# Patient Record
Sex: Male | Born: 1945 | Race: White | Hispanic: No | Marital: Married | Smoking: Current every day smoker
Health system: Southern US, Community
[De-identification: ages and names within clinical notes are randomized; demographics above are authoritative.]

## PROBLEM LIST (undated history)

## (undated) DIAGNOSIS — I350 Nonrheumatic aortic (valve) stenosis: Secondary | ICD-10-CM

## (undated) DIAGNOSIS — K429 Umbilical hernia without obstruction or gangrene: Secondary | ICD-10-CM

## (undated) DIAGNOSIS — G0490 Encephalitis and encephalomyelitis, unspecified: Secondary | ICD-10-CM

## (undated) DIAGNOSIS — Z9289 Personal history of other medical treatment: Secondary | ICD-10-CM

## (undated) DIAGNOSIS — J189 Pneumonia, unspecified organism: Secondary | ICD-10-CM

## (undated) DIAGNOSIS — E039 Hypothyroidism, unspecified: Secondary | ICD-10-CM

## (undated) DIAGNOSIS — I639 Cerebral infarction, unspecified: Secondary | ICD-10-CM

## (undated) DIAGNOSIS — R011 Cardiac murmur, unspecified: Secondary | ICD-10-CM

## (undated) DIAGNOSIS — Z9989 Dependence on other enabling machines and devices: Secondary | ICD-10-CM

## (undated) DIAGNOSIS — Z720 Tobacco use: Secondary | ICD-10-CM

## (undated) DIAGNOSIS — E079 Disorder of thyroid, unspecified: Secondary | ICD-10-CM

## (undated) DIAGNOSIS — E119 Type 2 diabetes mellitus without complications: Secondary | ICD-10-CM

## (undated) DIAGNOSIS — J439 Emphysema, unspecified: Secondary | ICD-10-CM

## (undated) DIAGNOSIS — E669 Obesity, unspecified: Secondary | ICD-10-CM

## (undated) DIAGNOSIS — I6529 Occlusion and stenosis of unspecified carotid artery: Secondary | ICD-10-CM

## (undated) DIAGNOSIS — I4891 Unspecified atrial fibrillation: Secondary | ICD-10-CM

## (undated) DIAGNOSIS — M1990 Unspecified osteoarthritis, unspecified site: Secondary | ICD-10-CM

## (undated) DIAGNOSIS — G4733 Obstructive sleep apnea (adult) (pediatric): Secondary | ICD-10-CM

## (undated) DIAGNOSIS — E785 Hyperlipidemia, unspecified: Secondary | ICD-10-CM

## (undated) DIAGNOSIS — K8010 Calculus of gallbladder with chronic cholecystitis without obstruction: Secondary | ICD-10-CM

## (undated) DIAGNOSIS — I10 Essential (primary) hypertension: Secondary | ICD-10-CM

## (undated) DIAGNOSIS — R0602 Shortness of breath: Secondary | ICD-10-CM

## (undated) DIAGNOSIS — L309 Dermatitis, unspecified: Secondary | ICD-10-CM

## (undated) DIAGNOSIS — K8000 Calculus of gallbladder with acute cholecystitis without obstruction: Secondary | ICD-10-CM

## (undated) HISTORY — DX: Emphysema, unspecified: J43.9

## (undated) HISTORY — DX: Dermatitis, unspecified: L30.9

## (undated) HISTORY — DX: Hyperlipidemia, unspecified: E78.5

## (undated) HISTORY — DX: Unspecified osteoarthritis, unspecified site: M19.90

## (undated) HISTORY — DX: Tobacco use: Z72.0

## (undated) HISTORY — DX: Obesity, unspecified: E66.9

## (undated) HISTORY — DX: Personal history of other medical treatment: Z92.89

## (undated) HISTORY — PX: JOINT REPLACEMENT: SHX530

## (undated) HISTORY — DX: Umbilical hernia without obstruction or gangrene: K42.9

## (undated) HISTORY — PX: KNEE ARTHROSCOPY: SUR90

## (undated) HISTORY — PX: TONSILLECTOMY: SUR1361

## (undated) SURGERY — LAPAROSCOPIC CHOLECYSTECTOMY WITH INTRAOPERATIVE CHOLANGIOGRAM
Anesthesia: General

---

## 2000-10-13 ENCOUNTER — Ambulatory Visit (HOSPITAL_BASED_OUTPATIENT_CLINIC_OR_DEPARTMENT_OTHER): Admission: RE | Admit: 2000-10-13 | Discharge: 2000-10-13 | Payer: Self-pay | Admitting: Orthopedic Surgery

## 2005-03-28 HISTORY — PX: TRANSTHORACIC ECHOCARDIOGRAM: SHX275

## 2006-04-07 HISTORY — PX: HERNIA REPAIR: SHX51

## 2006-04-29 ENCOUNTER — Ambulatory Visit (HOSPITAL_COMMUNITY): Admission: RE | Admit: 2006-04-29 | Discharge: 2006-04-30 | Payer: Self-pay | Admitting: General Surgery

## 2006-11-07 ENCOUNTER — Ambulatory Visit: Payer: Self-pay | Admitting: Gastroenterology

## 2006-11-21 ENCOUNTER — Ambulatory Visit: Payer: Self-pay | Admitting: Gastroenterology

## 2007-01-27 ENCOUNTER — Encounter: Payer: Self-pay | Admitting: Gastroenterology

## 2007-01-27 ENCOUNTER — Ambulatory Visit: Payer: Self-pay | Admitting: Gastroenterology

## 2007-11-24 ENCOUNTER — Encounter: Admission: RE | Admit: 2007-11-24 | Discharge: 2007-11-24 | Payer: Self-pay | Admitting: Orthopedic Surgery

## 2010-01-23 ENCOUNTER — Encounter: Admission: RE | Admit: 2010-01-23 | Discharge: 2010-01-23 | Payer: Self-pay | Admitting: Family Medicine

## 2010-01-23 DIAGNOSIS — G4730 Sleep apnea, unspecified: Secondary | ICD-10-CM | POA: Insufficient documentation

## 2010-02-23 DIAGNOSIS — Z9289 Personal history of other medical treatment: Secondary | ICD-10-CM

## 2010-02-23 HISTORY — DX: Personal history of other medical treatment: Z92.89

## 2010-11-23 NOTE — Procedures (Signed)
[facility] ([place])  Patient:    Mason Patton, Mason Patton                       MRN: [id number] Proc. Date: 10/13/00 Adm. Date:  [id number] Attending:  Milly Jakob                           Procedure Report  PREOPERATIVE DIAGNOSIS:  Medial meniscus tear with possible osteochondral injury.  POSTOPERATIVE DIAGNOSIS: 1. Medial meniscus tear. 2. Medial femoral condylar chondromalacia. 3. Lateral tibial plateau chondral injury. 4. Significant patellofemoral chondral injury.  OPERATION PERFORMED: 1. Partial posterior horn medial meniscectomy. 2. Debridement of medial femoral condyle. 3. Debridement of lateral tibial plateau. 4. Debridement of patellofemoral joint.  SURGEON:  Harvie Junior, M.D.  ANESTHESIA:  Block.  INDICATIONS FOR PROCEDURE:  The patient is a 65 year old male with a long history of having medial joint line pain after a twisting type injury.  He was given conservative care for six weeks without resolution of symptoms.  Because of continued complaints of pain and exam which was remarkable for meniscal tear, the patient was brought to the operating room for examination under anesthesia and arthroscopy.  DESCRIPTION OF PROCEDURE:  The patient was brought to the operating room and after adequate anesthesia was obtained with a knee block, patient was placed supine on the operating table.  The left leg was then examined under anesthesia and noted to be stable in all directions.  At this point the leg was prepped and draped in the usual sterile fashion and following this, routine arthroscopic examination of the knee revealed that there was obvious chondromalacia of the patellofemoral joint.  This was debrided with a suction shaver back to a stable rim of articular cartilage on both the patellar side and the trochlear side.  Attention was then turned to the medial compartment where there was noted to be a posterior horn medial meniscus tear  and corresponding significant chondromalacia of the medial femoral condyle.  The medial meniscus was debrided with a combination of straight-biting forceps, upbiting forceps and the remaining meniscal rim was contoured down with a suction shaver.  When the meniscus was stable, the medial femoral condyle was evaluated and noted to have fairly significant grade 3 changes on the medial femoral condyle.  This was debrided with a suction shaver.  Attention was then turned to the notch where the ACL was noted to be normal.  Attention was then turned laterally where the femoral condyle was normal.  The meniscus was normal.  There was a really soft area on the lateral tibial plateau and this was debrided with a suction shaver and back to a stable rim.  Once this was accomplished, the knee was copiously irrigated and suctioned dry.  A final check was made to make sure there were no loose fragmented pieces.  The arthroscope portal was then closed with a bandage and a sterile compressive dressing was applied.  The patient was transferred to the recovery room where he was noted to be in satisfactory condition.  Estimated blood loss for this procedure was none. DD:  10/13/00 TD:  10/13/00 Job: [id number]

## 2010-11-23 NOTE — Op Note (Signed)
Mason Patton, Mason Patton              ACCOUNT NO.:  [id number]   MEDICAL RECORD NO.:  [id number]          PATIENT TYPE:  AMB   LOCATION:  SDS                          FACILITY:  [facility]   PHYSICIAN:  Gita Kudo, M.D. DATE OF BIRTH:  [birth year]   DATE OF PROCEDURE:  04/29/2006  DATE OF DISCHARGE:                                 OPERATIVE REPORT   OPERATIVE PROCEDURE:  Repair of umbilical hernia, 8-cm-diameter Ventralex  mesh.   SURGEON:  Gita Kudo, M.D.   ANESTHESIA:  General.   PREOPERATIVE DIAGNOSIS:  Umbilical hernia.   POSTOPERATIVE DIAGNOSIS:  Umbilical hernia.   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  He also has a large diastasis.  He  is overweight at approximately 310 pounds and also has sleep apnea.   OPERATIVE FINDINGS:  The patient had a medium-sized umbilical hernia with an  approximate 3-cm defect.  It was easily reduced.  I did nothing about his  diastasis.   OPERATIVE PROCEDURE:  Under satisfactory general endotracheal anesthesia,  having received 1 g Ancef preop, the patient's abdomen was prepped and  draped in a standard fashion.  A total of 30 mL of 0.5% Marcaine with  epinephrine were infiltrated for postoperative analgesia.  A supraumbilical  transverse incision made and carried down to the fascia.  The hernia sac was  dissected from the overlying umbilical skin.  Self-retainers were placed and  then the hernia-abdominal wall fascia junction was circumscribed with the  cautery until I entered the preperitoneal space.  Then the hernia sac was  inverted and reduced into that space and finger dissection used to develop  the space for placement of the Ventralex mesh.  I was able to free the  undersurface all around and not enter the peritoneal cavity.   Following this, a large -- 8-cm -- Ventralex mesh was selected.  A suture  was placed through the abdominal wall, through the  upper portion of the mesh  and then back up through the abdominal wall and the mesh placed into the  preperitoneal space and tied.  Then 3 other sutures were placed in the other  quadrants in a similar fashion and when tied, the mesh lay in excellent  position.  The fascia was then closed in a transverse direction with figure-  of-eight 0 Prolene sutures, taking intermittent bites of the mesh.  When  completed, the repair lay without tension and appeared  solid.  The wound was then lavaged with saline and closed in layers with 2-0  and 3-0 Vicryl and Steri-Strips for skin.  Sterile absorbent dressings were  then applied and the patient went to the recovery room from the operating  room in good condition without complication.           ______________________________  Gita Kudo, M.D.     MRL/MEDQ  D:  04/29/2006  T:  04/30/2006  Job:  [id number]   cc:   Tammy R. Collins [place], M.D.

## 2011-09-08 ENCOUNTER — Encounter (HOSPITAL_COMMUNITY): Payer: Self-pay | Admitting: *Deleted

## 2011-09-08 ENCOUNTER — Emergency Department (HOSPITAL_COMMUNITY)
Admission: EM | Admit: 2011-09-08 | Discharge: 2011-09-08 | Disposition: A | Discharge status: Nursing Home (Includes ICF) | Payer: 59 | Source: Home / Self Care | Attending: Emergency Medicine | Admitting: Emergency Medicine

## 2011-09-08 ENCOUNTER — Other Ambulatory Visit: Payer: Self-pay

## 2011-09-08 ENCOUNTER — Emergency Department (HOSPITAL_COMMUNITY): Payer: 59

## 2011-09-08 ENCOUNTER — Inpatient Hospital Stay (HOSPITAL_COMMUNITY)
Admission: EM | Admit: 2011-09-08 | Discharge: 2011-09-10 | DRG: 419 | Disposition: A | Discharge status: Home / Self Care | Payer: 59 | Attending: General Surgery | Admitting: General Surgery

## 2011-09-08 ENCOUNTER — Encounter (HOSPITAL_COMMUNITY): Payer: Self-pay

## 2011-09-08 DIAGNOSIS — K8020 Calculus of gallbladder without cholecystitis without obstruction: Secondary | ICD-10-CM

## 2011-09-08 DIAGNOSIS — E119 Type 2 diabetes mellitus without complications: Secondary | ICD-10-CM | POA: Insufficient documentation

## 2011-09-08 DIAGNOSIS — K8010 Calculus of gallbladder with chronic cholecystitis without obstruction: Secondary | ICD-10-CM

## 2011-09-08 DIAGNOSIS — E079 Disorder of thyroid, unspecified: Secondary | ICD-10-CM

## 2011-09-08 DIAGNOSIS — K810 Acute cholecystitis: Secondary | ICD-10-CM

## 2011-09-08 DIAGNOSIS — F17200 Nicotine dependence, unspecified, uncomplicated: Secondary | ICD-10-CM | POA: Diagnosis present

## 2011-09-08 DIAGNOSIS — R112 Nausea with vomiting, unspecified: Secondary | ICD-10-CM | POA: Diagnosis present

## 2011-09-08 DIAGNOSIS — R1011 Right upper quadrant pain: Secondary | ICD-10-CM

## 2011-09-08 DIAGNOSIS — Z79899 Other long term (current) drug therapy: Secondary | ICD-10-CM

## 2011-09-08 DIAGNOSIS — G4733 Obstructive sleep apnea (adult) (pediatric): Secondary | ICD-10-CM | POA: Diagnosis present

## 2011-09-08 DIAGNOSIS — R109 Unspecified abdominal pain: Secondary | ICD-10-CM

## 2011-09-08 DIAGNOSIS — I10 Essential (primary) hypertension: Secondary | ICD-10-CM

## 2011-09-08 DIAGNOSIS — Z7982 Long term (current) use of aspirin: Secondary | ICD-10-CM

## 2011-09-08 DIAGNOSIS — K8000 Calculus of gallbladder with acute cholecystitis without obstruction: Principal | ICD-10-CM | POA: Diagnosis present

## 2011-09-08 HISTORY — DX: Essential (primary) hypertension: I10

## 2011-09-08 HISTORY — DX: Disorder of thyroid, unspecified: E07.9

## 2011-09-08 HISTORY — DX: Calculus of gallbladder with chronic cholecystitis without obstruction: K80.10

## 2011-09-08 HISTORY — DX: Calculus of gallbladder with acute cholecystitis without obstruction: K80.00

## 2011-09-08 LAB — COMPREHENSIVE METABOLIC PANEL
ALT: 18 U/L (ref 0–53)
Alkaline Phosphatase: 73 U/L (ref 39–117)
CO2: 25 mEq/L (ref 19–32)
GFR calc Af Amer: >90 mL/min (ref >90)
GFR calc non Af Amer: >90 mL/min (ref >90)
Glucose, Bld: 83 mg/dL (ref 70–99)
Potassium: 3.8 mEq/L (ref 3.5–5.1)
Sodium: 139 mEq/L (ref 135–145)

## 2011-09-08 LAB — DIFFERENTIAL
Lymphocytes Relative: 19 % (ref 12–46)
Lymphs Abs: 2.1 10*3/uL (ref 0.7–4.0)
Neutrophils Relative %: 66 % (ref 43–77)

## 2011-09-08 LAB — URINALYSIS, ROUTINE W REFLEX MICROSCOPIC
Hgb urine dipstick: NEGATIVE
Nitrite: NEGATIVE
Specific Gravity, Urine: 1.026 (ref 1.005–1.030)
Urobilinogen, UA: 1 mg/dL (ref 0.0–1.0)
pH: 5.5 (ref 5.0–8.0)

## 2011-09-08 LAB — CBC
MCV: 87.1 fL (ref 78.0–100.0)
Platelets: 231 10*3/uL (ref 150–400)
RBC: 4.56 MIL/uL (ref 4.22–5.81)
WBC: 10.8 10*3/uL — ABNORMAL HIGH (ref 4.0–10.5)

## 2011-09-08 LAB — URINE MICROSCOPIC-ADD ON

## 2011-09-08 MED ORDER — HYDROMORPHONE HCL PF 1 MG/ML IJ SOLN: 1.0000 mg | INTRAMUSCULAR | Status: DC | PRN

## 2011-09-08 MED ORDER — INSULIN ASPART 100 UNIT/ML ~~LOC~~ SOLN: 0.0000 [IU] | SUBCUTANEOUS | Status: DC

## 2011-09-08 MED ORDER — ONDANSETRON HCL 4 MG/2ML IJ SOLN
4.0000 mg | Freq: Four times a day (QID) | INTRAMUSCULAR | Status: DC | PRN
  Administered 2011-09-09: 4 mg via INTRAVENOUS
  Filled 2011-09-08: qty 2

## 2011-09-08 MED ORDER — INSULIN ASPART 100 UNIT/ML ~~LOC~~ SOLN
0.0000 [IU] | SUBCUTANEOUS | Status: DC
  Administered 2011-09-09 – 2011-09-10 (×3): 2 [IU] via SUBCUTANEOUS
  Administered 2011-09-10: 3 [IU] via SUBCUTANEOUS
  Filled 2011-09-08: qty 3

## 2011-09-08 MED ORDER — SPIRONOLACTONE 25 MG PO TABS
25.0000 mg | ORAL_TABLET | Freq: Every day | ORAL | Status: DC
  Administered 2011-09-08 – 2011-09-09 (×2): 25 mg via ORAL
  Filled 2011-09-08 (×3): qty 1

## 2011-09-08 MED ORDER — ONDANSETRON HCL 4 MG/2ML IJ SOLN: 4.0000 mg | Freq: Four times a day (QID) | INTRAMUSCULAR | Status: DC | PRN

## 2011-09-08 MED ORDER — ENOXAPARIN SODIUM 40 MG/0.4ML ~~LOC~~ SOLN: 40.0000 mg | SUBCUTANEOUS | Status: DC

## 2011-09-08 MED ORDER — PIPERACILLIN-TAZOBACTAM 3.375 G IVPB
3.3750 g | Freq: Three times a day (TID) | INTRAVENOUS | Status: DC
  Administered 2011-09-09 – 2011-09-10 (×4): 3.375 g via INTRAVENOUS
  Filled 2011-09-08 (×6): qty 50

## 2011-09-08 MED ORDER — OXYCODONE HCL 5 MG PO TABS: 5.0000 mg | ORAL_TABLET | ORAL | Status: DC | PRN

## 2011-09-08 MED ORDER — PIPERACILLIN-TAZOBACTAM 3.375 G IVPB
3.3750 g | Freq: Once | INTRAVENOUS | Status: DC
  Administered 2011-09-08: 3.375 g via INTRAVENOUS
  Filled 2011-09-08: qty 50

## 2011-09-08 MED ORDER — HYDROMORPHONE HCL PF 1 MG/ML IJ SOLN: 0.5000 mg | INTRAMUSCULAR | Status: DC | PRN

## 2011-09-08 MED ORDER — BENAZEPRIL HCL 20 MG PO TABS
20.0000 mg | ORAL_TABLET | Freq: Every day | ORAL | Status: DC
  Administered 2011-09-08 – 2011-09-09 (×2): 20 mg via ORAL
  Filled 2011-09-08 (×3): qty 1

## 2011-09-08 MED ORDER — ACETAMINOPHEN 650 MG RE SUPP: 650.0000 mg | Freq: Four times a day (QID) | RECTAL | Status: DC | PRN

## 2011-09-08 MED ORDER — ALUM & MAG HYDROXIDE-SIMETH 200-200-20 MG/5ML PO SUSP: 30.0000 mL | Freq: Four times a day (QID) | ORAL | Status: DC | PRN

## 2011-09-08 MED ORDER — ENOXAPARIN SODIUM 30 MG/0.3ML ~~LOC~~ SOLN: 1.0000 mg/kg | Freq: Two times a day (BID) | SUBCUTANEOUS | Status: DC

## 2011-09-08 MED ORDER — LEVOTHYROXINE SODIUM 125 MCG PO TABS
250.0000 ug | ORAL_TABLET | Freq: Every day | ORAL | Status: DC
  Administered 2011-09-09 – 2011-09-10 (×2): 250 ug via ORAL
  Filled 2011-09-08 (×3): qty 2

## 2011-09-08 MED ORDER — ACETAMINOPHEN 325 MG PO TABS: 650.0000 mg | ORAL_TABLET | Freq: Four times a day (QID) | ORAL | Status: DC | PRN

## 2011-09-08 MED ORDER — ZOLPIDEM TARTRATE 5 MG PO TABS: 5.0000 mg | ORAL_TABLET | Freq: Every evening | ORAL | Status: DC | PRN

## 2011-09-08 MED ORDER — PIPERACILLIN-TAZOBACTAM 3.375 G IVPB
3.3750 g | Freq: Three times a day (TID) | INTRAVENOUS | Status: DC
  Filled 2011-09-08 (×2): qty 50

## 2011-09-08 MED ORDER — PANTOPRAZOLE SODIUM 40 MG IV SOLR
40.0000 mg | Freq: Every day | INTRAVENOUS | Status: DC
  Administered 2011-09-08 – 2011-09-09 (×2): 40 mg via INTRAVENOUS
  Filled 2011-09-08 (×3): qty 40

## 2011-09-08 MED ORDER — SODIUM CHLORIDE 0.9 % IV SOLN: INTRAVENOUS | Status: DC

## 2011-09-08 MED ORDER — POTASSIUM CHLORIDE IN NACL 20-0.45 MEQ/L-% IV SOLN
INTRAVENOUS | Status: DC
  Administered 2011-09-09: via INTRAVENOUS
  Filled 2011-09-08 (×5): qty 1000

## 2011-09-08 MED ORDER — ONDANSETRON HCL 8 MG PO TABS: 4.0000 mg | ORAL_TABLET | Freq: Four times a day (QID) | ORAL | Status: DC | PRN

## 2011-09-08 MED ORDER — AMLODIPINE BESYLATE 10 MG PO TABS
10.0000 mg | ORAL_TABLET | Freq: Every day | ORAL | Status: DC
Start: 2011-09-08 — End: 2011-09-10
  Administered 2011-09-08 – 2011-09-09 (×2): 10 mg via ORAL
  Filled 2011-09-08 (×3): qty 1

## 2011-09-08 NOTE — ED Provider Notes (Signed)
History     CSN: [id number]  Arrival date & time 09/08/11  1034   First MD Initiated Contact with Patient 09/08/11 1122      Chief Complaint  Patient presents with  . Abdominal Pain    HPI Comments: Patient has a history of diabetes, hypertension reports  constant, midline abdominal pain for 5 days. States it changes from sharp to dull- states it is sharp when he goes from lying to sitting for/sitting to lying, and is worse when he eats. Abdominal pain not related to exertion, rest, fasting, but movement, urination. This pain is becoming more severe over the past 2 days. States he initially had back pain, multiple episodes of emesis, and then felt a "tearing" sensation in his abdomen 5 days ago. No further episodes of emesis, or tearing sensation. Patient has a large ventral hernia, but does not remember if this got larger after the tearing sensation. No nausea, vomiting, chest pain, shortness of breath, diaphoresis,  anorexia. No palpitations, presyncope, syncope. Last bowel movement yesterday, and was within normal limits for him. No hematuria, urinary complaints. No history of personal, family history of AAA.    ROS as noted in HPI. All other ROS negative.    Past Medical History  Diagnosis Date  . Hypertension   . Diabetes mellitus   . Thyroid disease     Past Surgical History  Procedure Date  . Inguinal hernia repair   . Knee arthroscopy     History reviewed. No pertinent family history.  History  Substance Use Topics  . Smoking status: Current Everyday Smoker -- 0.5 packs/day  . Smokeless tobacco: Not on file  . Alcohol Use: No      Review of Systems  Allergies  Codeine and Demerol  Home Medications   Current Outpatient Rx  Name Route Sig Dispense Refill  . AMLODIPINE BESYLATE 10 MG PO TABS Oral Take 10 mg by mouth daily.    . ASPIRIN 81 MG PO TABS Oral Take 81 mg by mouth daily.    [name] BENAZEPRIL HCL 20 MG PO TABS Oral Take 20 mg by mouth daily.    .  GLYBURIDE-METFORMIN 5-500 MG PO TABS Oral Take 1 tablet by mouth daily with breakfast.    . LEVOTHYROXINE SODIUM 200 MCG PO TABS Oral Take 200 mcg by mouth daily.    . MELOXICAM 15 MG PO TABS Oral Take 15 mg by mouth daily.    [name] PRAVASTATIN SODIUM 40 MG PO TABS Oral Take 40 mg by mouth daily.    [name] SPIRONOLACTONE 25 MG PO TABS Oral Take 25 mg by mouth daily.      BP 144/75  Pulse 59  Temp(Src) 98.6 F (37 C) (Oral)  Resp 21  SpO2 95%  Physical Exam  Nursing note and vitals reviewed. Constitutional: He is oriented to person, place, and time. He appears well-developed and well-nourished.  HENT:  Head: Normocephalic and atraumatic.  Eyes: Conjunctivae and EOM are normal.  Neck: Normal range of motion.  Cardiovascular: Normal rate, regular rhythm, normal heart sounds, intact distal pulses and normal pulses.  Exam reveals no gallop and no friction rub.   No murmur heard. Pulmonary/Chest: Effort normal and breath sounds normal.  Abdominal: Soft. Bowel sounds are normal. He exhibits no distension, no fluid wave, no abdominal bruit and no pulsatile midline mass. There is no rebound and no CVA tenderness. A hernia is present. Hernia confirmed positive in the ventral area.       Umbilical surgical  scar. Large, tender, reducible ventral hernia.  Musculoskeletal: Normal range of motion. He exhibits no edema.  Neurological: He is alert and oriented to person, place, and time.  Skin: Skin is warm and dry. No rash noted.  Psychiatric: He has a normal mood and affect. His behavior is normal. Judgment and thought content normal.    ED Course  Procedures (including critical care time)  Labs Reviewed - No data to display No results found.   1. Abdominal pain     EKG: Sinus bradycardia, rate 56. Normal intervals. left axis deviation. No hypertrophy. No ST-T wave changes. No previous EKG for comparison.   MDM  Previous medical records reviewed. Patient had a umbilical hernia repair in 2007.   Patient appears comfortable, with a tender ventral hernia. No abdominal bruit. No palpable, pulsatile abdominal mass, but patient is obese. Concern for abdominal wall tear versus AAA. Vital signs acceptable, pulses equal in all extremities. EKG with no acute ischemic changes. Transferring the ED for further evaluation  Luiz Blare, MD 09/08/11 1254

## 2011-09-08 NOTE — ED Notes (Signed)
Pt reports that he uses bipap machine to sleep at night.

## 2011-09-08 NOTE — ED Notes (Signed)
Multiple attempts at contacting ultrasound for pt. To contact where pt is on list. Will continue.

## 2011-09-08 NOTE — Progress Notes (Signed)
ANTIBIOTIC CONSULT NOTE - INITIAL  Pharmacy Consult for Zosyn Indication: Acute cholecystitis  Allergies  Allergen Reactions  . Codeine Nausea And Vomiting  . Demerol Nausea And Vomiting    Patient Measurements: Height: 5\' 10"  (177.8 cm) Weight: 266 lb 4.8 oz (120.793 kg) IBW/kg (Calculated) : 73   Vital Signs: Temp: 97.8 F (36.6 C) (03/03 2215) Temp src: Oral (03/03 2215) BP: 145/72 mmHg (03/03 2215) Pulse Rate: 63  (03/03 2215) Intake/Output from previous day:   Intake/Output from this shift:    Labs:  Basename 09/08/11 1415  WBC 10.8*  HGB 13.5  PLT 231  LABCREA --  CREATININE 0.77   Estimated Creatinine Clearance: 119.9 ml/min (by C-G formula based on Cr of 0.77). No results found for this basename: VANCOTROUGH:2,VANCOPEAK:2,VANCORANDOM:2,GENTTROUGH:2,GENTPEAK:2,GENTRANDOM:2,TOBRATROUGH:2,TOBRAPEAK:2,TOBRARND:2,AMIKACINPEAK:2,AMIKACINTROU:2,AMIKACIN:2, in the last 72 hours   Microbiology: No results found for this or any previous visit (from the past 720 hour(s)).  Medical History: Past Medical History  Diagnosis Date  . Hypertension   . Diabetes mellitus   . Thyroid disease   . Diabetes mellitus   . Obstructive sleep apnea of adult   . Gallstones and inflammation of gallbladder without obstruction   . Cholecystitis with cholelithiasis     Medications:  Anti-infectives     Start     Dose/Rate Route Frequency Ordered Stop   09/08/11 2300  piperacillin-tazobactam (ZOSYN) IVPB 3.375 g       3.375 g 12.5 mL/hr over 240 Minutes Intravenous 3 times per day 09/08/11 2229     09/08/11 1815   piperacillin-tazobactam (ZOSYN) IVPB 3.375 g  Status:  Discontinued        3.375 g 12.5 mL/hr over 240 Minutes Intravenous  Once 09/08/11 1804 09/08/11 2007         Assessment: Acute cholecysitits:  To continue empiric therapy with Zosyn.  Renal fx is excellent.  Plan:  Continue Zosyn 3.375gm IV q8h extended infusion. As no dosage adjustments are anticipated  pharmacy will sign off.  Estella Husk, Pharm.D., BCPS Clinical Pharmacist  Pager [phone] 09/08/2011, 10:32 PM

## 2011-09-08 NOTE — H&P (Signed)
Mason Patton is an 66 y.o. male.   Chief Complaint: Upper abdominal pain, nausea, vomiting HPI: 66 yo male presents with a 5 day history of upper abdominal pain, Right greater than left, associated with nausea/vomiting/ poor appetite.  The patient reports pain worsening with food.  Limited PO intake this week.  Significant abdominal bloating.    Past Medical History  Diagnosis Date  . Hypertension   . Diabetes mellitus   . Thyroid disease   . Diabetes mellitus   . Obstructive sleep apnea of adult   . Gallstones and inflammation of gallbladder without obstruction   . Cholecystitis with cholelithiasis   Wears CPAP at night.  Past Surgical History  Procedure Date  . Inguinal hernia repair   . Knee arthroscopy   Umbilical hernia repair with mesh 2005 Dr. Maryagnes Amos  No family history on file. Social History:  reports that he has been smoking.  He does not have any smokeless tobacco history on file. He reports that he does not drink alcohol or use illicit drugs.  Allergies:  Allergies  Allergen Reactions  . Codeine Nausea And Vomiting  . Demerol Nausea And Vomiting    No current facility-administered medications on file prior to encounter.   Current Outpatient Prescriptions on File Prior to Encounter  Medication Sig Dispense Refill  . amLODipine (NORVASC) 10 MG tablet Take 10 mg by mouth daily.      [name] aspirin 81 MG tablet Take 81 mg by mouth daily.      . benazepril (LOTENSIN) 20 MG tablet Take 20 mg by mouth daily.      [name] glyBURIDE-metformin (GLUCOVANCE) 5-500 MG per tablet Take 3 tablets by mouth every morning.       . pravastatin (PRAVACHOL) 40 MG tablet Take 40 mg by mouth daily.      [name] spironolactone (ALDACTONE) 25 MG tablet Take 25 mg by mouth daily.         Results for orders placed during the hospital encounter of 09/08/11 (from the past 48 hour(s))  URINALYSIS, ROUTINE W REFLEX MICROSCOPIC     Status: Abnormal   Collection Time   09/08/11  1:25 PM      Component Value  Range Comment   Color, Urine AMBER (*) YELLOW  BIOCHEMICALS MAY BE AFFECTED BY COLOR   APPearance CLOUDY (*) CLEAR     Specific Gravity, Urine 1.026  1.005 - 1.030     pH 5.5  5.0 - 8.0     Glucose, UA NEGATIVE  NEGATIVE (mg/dL)    Hgb urine dipstick NEGATIVE  NEGATIVE     Bilirubin Urine SMALL (*) NEGATIVE     Ketones, ur NEGATIVE  NEGATIVE (mg/dL)    Protein, ur NEGATIVE  NEGATIVE (mg/dL)    Urobilinogen, UA 1.0  0.0 - 1.0 (mg/dL)    Nitrite NEGATIVE  NEGATIVE     Leukocytes, UA SMALL (*) NEGATIVE    URINE MICROSCOPIC-ADD ON     Status: Abnormal   Collection Time   09/08/11  1:25 PM      Component Value Range Comment   Squamous Epithelial / LPF FEW (*) RARE     WBC, UA 3-6  <3 (WBC/hpf)    Urine-Other MUCOUS PRESENT     CBC     Status: Abnormal   Collection Time   09/08/11  2:15 PM      Component Value Range Comment   WBC 10.8 (*) 4.0 - 10.5 (K/uL)    RBC 4.56  4.22 - 5.81 (  MIL/uL)    Hemoglobin 13.5  13.0 - 17.0 (g/dL)    HCT 16.1  09.6 - 04.5 (%)    MCV 87.1  78.0 - 100.0 (fL)    MCH 29.6  26.0 - 34.0 (pg)    MCHC 34.0  30.0 - 36.0 (g/dL)    RDW 40.9  81.1 - 91.4 (%)    Platelets 231  150 - 400 (K/uL)   DIFFERENTIAL     Status: Abnormal   Collection Time   09/08/11  2:15 PM      Component Value Range Comment   Neutrophils Relative 66  43 - 77 (%)    Neutro Abs 7.2  1.7 - 7.7 (K/uL)    Lymphocytes Relative 19  12 - 46 (%)    Lymphs Abs 2.1  0.7 - 4.0 (K/uL)    Monocytes Relative 6  3 - 12 (%)    Monocytes Absolute 0.7  0.1 - 1.0 (K/uL)    Eosinophils Relative 8 (*) 0 - 5 (%)    Eosinophils Absolute 0.9 (*) 0.0 - 0.7 (K/uL)    Basophils Relative 0  0 - 1 (%)    Basophils Absolute 0.0  0.0 - 0.1 (K/uL)   COMPREHENSIVE METABOLIC PANEL     Status: Abnormal   Collection Time   09/08/11  2:15 PM      Component Value Range Comment   Sodium 139  135 - 145 (mEq/L)    Potassium 3.8  3.5 - 5.1 (mEq/L)    Chloride 103  96 - 112 (mEq/L)    CO2 25  19 - 32 (mEq/L)    Glucose, Bld  83  70 - 99 (mg/dL)    BUN 15  6 - 23 (mg/dL)    Creatinine, Ser 7.82  0.50 - 1.35 (mg/dL)    Calcium 9.8  8.4 - 10.5 (mg/dL)    Total Protein 7.1  6.0 - 8.3 (g/dL)    Albumin 3.4 (*) 3.5 - 5.2 (g/dL)    AST 14  0 - 37 (U/L)    ALT 18  0 - 53 (U/L)    Alkaline Phosphatase 73  39 - 117 (U/L)    Total Bilirubin 0.7  0.3 - 1.2 (mg/dL)    GFR calc non Af Amer >90  >90 (mL/min)    GFR calc Af Amer >90  >90 (mL/min)   LIPASE, BLOOD     Status: Normal   Collection Time   09/08/11  2:15 PM      Component Value Range Comment   Lipase 19  11 - 59 (U/L)   TROPONIN I     Status: Normal   Collection Time   09/08/11  2:16 PM      Component Value Range Comment   Troponin I <0.30  <0.30 (ng/mL)    US Abdomen Complete  09/08/2011  *RADIOLOGY REPORT*  Clinical Data:  Abdominal pain,  COMPLETE ABDOMINAL ULTRASOUND  Comparison:  None.  Findings:  Gallbladder:  There multiple gallstone within the gallbladder which measuring 10-15 mm.  Gallbladder wall is thickened to 10-12 mm. There is edema within the thickened gallbladder wall.  Positive sonographic Murphy's sign.  Common bile duct:  Normal at 5 mm.  Liver:  No intrahepatic biliary ductal dilatation.  There is diffuse increase echogenicity of the liver.  There is a focal area of hypoechogenicity measuring 2.1 x 1.6 cm adjacent to the gallbladder fossa which may represent fatty sparing.  IVC:  Appears normal.  Pancreas:  No  focal abnormality seen.  Spleen:  Normal size and echogenicity.  Right Kidney:  13.7cm in length.  Hyperechoic focus measuring 7 mm in mid pole likely represents a calculus.  No evidence of hydronephrosis.  Left Kidney:  13.8cm in length.  No evidence of hydronephrosis or stones.  Abdominal aorta:  No aneurysm identified.  IMPRESSION:  1.  Multiple gallstones with thickened edematous gallbladder wall and positive Murphy's sign is concerning  acute cholecystitis. 2.  Echogenic liver to suggests  hepatic steatosis.  Hypoechoic area adjacent the  gallbladder fossa likely represents focal fatty sparing as this is a typical location. 3.  Right nephrolithiasis.  Original Report Authenticated By: Genevive Bi, M.D.    Review of Systems  Gastrointestinal: Positive for nausea, vomiting and abdominal pain.    Blood pressure 148/74, pulse 63, temperature 97.5 F (36.4 C), resp. rate 20, SpO2 98.00%. Physical Exam  Overweight male in NAD HEENT:  EOMI, sclera anicteric Neck:  No masses, no thyromegaly Lungs:  CTA bilaterally; normal respiratory effort CV:  Regular rate and rhythm; no murmurs Abd:  Protuberant; upper midline rectus diastasis; healed umbilical hernia scar Tender in RUQ with radiation to the back and across to LUQ Ext:  Well-perfused; no edema Skin:  Warm, dry; no sign of jaundice   Assessment/Plan Acute cholecystitis no sign of pancreatitis or CBD obstruction comorbidities - DM2, HTN, OSA  Plan:  IV Abx, bowel rest Laparoscopic cholecystectomy tomorrow  Aysha Livecchi K. 09/08/2011, 8:07 PM

## 2011-09-08 NOTE — ED Notes (Signed)
ucc transfer.  The pt has abd pain since Tuesday night no nv since Tuesday.

## 2011-09-08 NOTE — ED Provider Notes (Signed)
History     CSN: [id number]  Arrival date & time 09/08/11  1305   First MD Initiated Contact with Patient 09/08/11 1323      Chief Complaint  Patient presents with  . Abdominal Pain    (Consider location/radiation/quality/duration/timing/severity/associated sxs/prior treatment) Patient is a 66 y.o. male presenting with abdominal pain. The history is provided by the patient.  Abdominal Pain The primary symptoms of the illness include abdominal pain.  He has been having intermittent epigastric and right upper quadrant pain for the last 5 days. When his symptoms started, he had vomited multiple times. Since then, he has not had any nausea or vomiting. Pain is currently mild and he rates it at a 2/10. At its worst, the pain is 9/10. Pain is worse after eating. He has tried taking antacids with no relief. There has been no fever, chills, sweats. He denies any difficulty of breathing. There's been no constipation or diarrhea. He does relate that he had 2 gallstones identified as an incidental finding when he had a cardiac CT scan several years ago.  Past Medical History  Diagnosis Date  . Hypertension   . Diabetes mellitus   . Thyroid disease     Past Surgical History  Procedure Date  . Inguinal hernia repair   . Knee arthroscopy     No family history on file.  History  Substance Use Topics  . Smoking status: Current Everyday Smoker -- 0.5 packs/day  . Smokeless tobacco: Not on file  . Alcohol Use: No      Review of Systems  Gastrointestinal: Positive for abdominal pain.  All other systems reviewed and are negative.    Allergies  Codeine and Demerol  Home Medications   Current Outpatient Rx  Name Route Sig Dispense Refill  . AMLODIPINE BESYLATE 10 MG PO TABS Oral Take 10 mg by mouth daily.    . ASPIRIN 81 MG PO TABS Oral Take 81 mg by mouth daily.    [name] BENAZEPRIL HCL 20 MG PO TABS Oral Take 20 mg by mouth daily.    [name] VITAMIN B 12 PO Oral Take 1 tablet by mouth  daily.    . GLYBURIDE-METFORMIN 5-500 MG PO TABS Oral Take 3 tablets by mouth every morning.     [name] LEVOTHYROXINE SODIUM 125 MCG PO TABS Oral Take 250 mcg by mouth daily.    [name] PRAVASTATIN SODIUM 40 MG PO TABS Oral Take 40 mg by mouth daily.    [name] SPIRONOLACTONE 25 MG PO TABS Oral Take 25 mg by mouth daily.    . TRIAMCINOLONE ACETONIDE 0.1 % EX OINT Topical Apply 1 application topically 2 (two) times daily. For eczema rash      BP 134/64  Pulse 58  Temp 97.5 F (36.4 C)  Resp 20  SpO2 98%  Physical Exam  Nursing note and vitals reviewed.  66 year old male who is resting comfortably and in no acute distress. Vital signs are significant for mild bradycardia with heart rate 58. Oxygen saturation is 90% which is normal. Head is normocephalic and atraumatic. PERRLA, EOMI. This has icterus. Oropharynx is clear. Neck is nontender and supple without adenopathy or JVD. Lungs are clear without rales, wheezes, rhonchi. Back is nontender and there is no CVA tenderness. Heart has regular rate and rhythm without murmur. Abdomen is soft, flat, with mild epigastric tenderness and moderate right upper quadrant tenderness with a plus minus Murphy sign. Ventral hernia is present without significant tenderness. There is no hepatosplenomegaly. Peristalsis is  diminished. Extremities have no cyanosis or edema. Skin is warm and moist without rash. Neurologic: Mental status is normal, cranial nerves are intact, there no focal motor or sensory deficits.  ED Course  Procedures (including critical care time)  Results for orders placed during the hospital encounter of 09/08/11  URINALYSIS, ROUTINE W REFLEX MICROSCOPIC      Component Value Range   Color, Urine AMBER (*) YELLOW    APPearance CLOUDY (*) CLEAR    Specific Gravity, Urine 1.026  1.005 - 1.030    pH 5.5  5.0 - 8.0    Glucose, UA NEGATIVE  NEGATIVE (mg/dL)   Hgb urine dipstick NEGATIVE  NEGATIVE    Bilirubin Urine SMALL (*) NEGATIVE    Ketones, ur NEGATIVE   NEGATIVE (mg/dL)   Protein, ur NEGATIVE  NEGATIVE (mg/dL)   Urobilinogen, UA 1.0  0.0 - 1.0 (mg/dL)   Nitrite NEGATIVE  NEGATIVE    Leukocytes, UA SMALL (*) NEGATIVE   CBC      Component Value Range   WBC 10.8 (*) 4.0 - 10.5 (K/uL)   RBC 4.56  4.22 - 5.81 (MIL/uL)   Hemoglobin 13.5  13.0 - 17.0 (g/dL)   HCT 78.2  95.6 - 21.3 (%)   MCV 87.1  78.0 - 100.0 (fL)   MCH 29.6  26.0 - 34.0 (pg)   MCHC 34.0  30.0 - 36.0 (g/dL)   RDW 08.6  57.8 - 46.9 (%)   Platelets 231  150 - 400 (K/uL)  DIFFERENTIAL      Component Value Range   Neutrophils Relative 66  43 - 77 (%)   Neutro Abs 7.2  1.7 - 7.7 (K/uL)   Lymphocytes Relative 19  12 - 46 (%)   Lymphs Abs 2.1  0.7 - 4.0 (K/uL)   Monocytes Relative 6  3 - 12 (%)   Monocytes Absolute 0.7  0.1 - 1.0 (K/uL)   Eosinophils Relative 8 (*) 0 - 5 (%)   Eosinophils Absolute 0.9 (*) 0.0 - 0.7 (K/uL)   Basophils Relative 0  0 - 1 (%)   Basophils Absolute 0.0  0.0 - 0.1 (K/uL)  COMPREHENSIVE METABOLIC PANEL      Component Value Range   Sodium 139  135 - 145 (mEq/L)   Potassium 3.8  3.5 - 5.1 (mEq/L)   Chloride 103  96 - 112 (mEq/L)   CO2 25  19 - 32 (mEq/L)   Glucose, Bld 83  70 - 99 (mg/dL)   BUN 15  6 - 23 (mg/dL)   Creatinine, Ser 6.29  0.50 - 1.35 (mg/dL)   Calcium 9.8  8.4 - 52.8 (mg/dL)   Total Protein 7.1  6.0 - 8.3 (g/dL)   Albumin 3.4 (*) 3.5 - 5.2 (g/dL)   AST 14  0 - 37 (U/L)   ALT 18  0 - 53 (U/L)   Alkaline Phosphatase 73  39 - 117 (U/L)   Total Bilirubin 0.7  0.3 - 1.2 (mg/dL)   GFR calc non Af Amer >90  >90 (mL/min)   GFR calc Af Amer >90  >90 (mL/min)  LIPASE, BLOOD      Component Value Range   Lipase 19  11 - 59 (U/L)  TROPONIN I      Component Value Range   Troponin I <0.30  <0.30 (ng/mL)  URINE MICROSCOPIC-ADD ON      Component Value Range   Squamous Epithelial / LPF FEW (*) RARE    WBC, UA 3-6  <3 (WBC/hpf)  Urine-Other MUCOUS PRESENT    GLUCOSE, CAPILLARY      Component Value Range   Glucose-Capillary 90  70 -  99 (mg/dL)   Comment 1 Notify RN     Comment 2 Documented in Chart    GLUCOSE, CAPILLARY      Component Value Range   Glucose-Capillary 92  70 - 99 (mg/dL)   Comment 1 Documented in Chart     Comment 2 Notify RN     US Abdomen Complete  09/08/2011  *RADIOLOGY REPORT*  Clinical Data:  Abdominal pain,  COMPLETE ABDOMINAL ULTRASOUND  Comparison:  None.  Findings:  Gallbladder:  There multiple gallstone within the gallbladder which measuring 10-15 mm.  Gallbladder wall is thickened to 10-12 mm. There is edema within the thickened gallbladder wall.  Positive sonographic Murphy's sign.  Common bile duct:  Normal at 5 mm.  Liver:  No intrahepatic biliary ductal dilatation.  There is diffuse increase echogenicity of the liver.  There is a focal area of hypoechogenicity measuring 2.1 x 1.6 cm adjacent to the gallbladder fossa which may represent fatty sparing.  IVC:  Appears normal.  Pancreas:  No focal abnormality seen.  Spleen:  Normal size and echogenicity.  Right Kidney:  13.7cm in length.  Hyperechoic focus measuring 7 mm in mid pole likely represents a calculus.  No evidence of hydronephrosis.  Left Kidney:  13.8cm in length.  No evidence of hydronephrosis or stones.  Abdominal aorta:  No aneurysm identified.  IMPRESSION:  1.  Multiple gallstones with thickened edematous gallbladder wall and positive Murphy's sign is concerning  acute cholecystitis. 2.  Echogenic liver to suggests  hepatic steatosis.  Hypoechoic area adjacent the gallbladder fossa likely represents focal fatty sparing as this is a typical location. 3.  Right nephrolithiasis.  Original Report Authenticated By: Genevive Bi, M.D.      Date: 09/08/2011  Rate: 56  Rhythm: sinus bradycardia  QRS Axis: left  Intervals: normal  ST/T Wave abnormalities: normal  Conduction Disutrbances:none  Narrative Interpretation: Left axis deviation, otherwise normal ECG. No old ECG available for comparison.  Old EKG Reviewed: none available  He  got good relief of pain with IV Dilaudid. With history of gallstones in the past, it was elected to obtain an ultrasound to evaluate for possible acute cholecystitis. He likely will need cholecystectomy and evaluation will determine whether it is needed electively or emergently.  1. Acute cholecystitis   2. Hypertension   3. Thyroid disease   4. Diabetes mellitus   5. Cholecystitis with cholelithiasis       MDM  Abdominal pain which seems most likely to be biliary colic. Patient states that time gallstones were identified as an incidental finding on a cardiac CT scan. Ultrasound will be obtained to rule out acute cholecystitis and liver function tests will be checked.        Dione Booze, MD 09/09/11 [phone]

## 2011-09-08 NOTE — ED Notes (Signed)
Pt presents with vomiting on Tuesday and pain in upper abd since then, pt sts feels like a tearing sensation or pulled muscle last pm. No issues with bowel and bladder habits. No pain at present, hx of galls tones,  Attempted relief with pepto but no help.

## 2011-09-08 NOTE — ED Notes (Signed)
Pt had 6-8 episodes of vomiting on Tuesday and continues to have abd pain that is worsening.

## 2011-09-09 ENCOUNTER — Inpatient Hospital Stay (HOSPITAL_COMMUNITY): Payer: 59 | Admitting: Anesthesiology

## 2011-09-09 ENCOUNTER — Inpatient Hospital Stay (HOSPITAL_COMMUNITY): Payer: 59

## 2011-09-09 ENCOUNTER — Encounter (HOSPITAL_COMMUNITY): Payer: Self-pay | Admitting: Anesthesiology

## 2011-09-09 ENCOUNTER — Encounter (HOSPITAL_COMMUNITY): Admission: EM | Disposition: A | Discharge status: Home / Self Care | Payer: Self-pay | Source: Home / Self Care

## 2011-09-09 DIAGNOSIS — K812 Acute cholecystitis with chronic cholecystitis: Secondary | ICD-10-CM

## 2011-09-09 HISTORY — PX: CHOLECYSTECTOMY: SHX55

## 2011-09-09 LAB — CBC
Hemoglobin: 13.3 g/dL (ref 13.0–17.0)
MCH: 29.6 pg (ref 26.0–34.0)
MCV: 86.6 fL (ref 78.0–100.0)
RBC: 4.49 MIL/uL (ref 4.22–5.81)
WBC: 9.6 10*3/uL (ref 4.0–10.5)

## 2011-09-09 LAB — GLUCOSE, CAPILLARY
Glucose-Capillary: 106 mg/dL — ABNORMAL HIGH (ref 70–99)
Glucose-Capillary: 129 mg/dL — ABNORMAL HIGH (ref 70–99)
Glucose-Capillary: 141 mg/dL — ABNORMAL HIGH (ref 70–99)
Glucose-Capillary: 87 mg/dL (ref 70–99)
Glucose-Capillary: 90 mg/dL (ref 70–99)

## 2011-09-09 LAB — COMPREHENSIVE METABOLIC PANEL
AST: 13 U/L (ref 0–37)
Albumin: 3.1 g/dL — ABNORMAL LOW (ref 3.5–5.2)
BUN: 13 mg/dL (ref 6–23)
Calcium: 9.5 mg/dL (ref 8.4–10.5)
Creatinine, Ser: 0.79 mg/dL (ref 0.50–1.35)
Total Protein: 6.8 g/dL (ref 6.0–8.3)

## 2011-09-09 LAB — HEMOGLOBIN A1C
Hgb A1c MFr Bld: 7.3 % — ABNORMAL HIGH (ref <5.7)
Mean Plasma Glucose: 163 mg/dL — ABNORMAL HIGH (ref <117)

## 2011-09-09 SURGERY — LAPAROSCOPIC CHOLECYSTECTOMY WITH INTRAOPERATIVE CHOLANGIOGRAM
Anesthesia: General | Site: Abdomen | Wound class: Contaminated

## 2011-09-09 MED ORDER — IOHEXOL 300 MG/ML  SOLN
INTRAMUSCULAR | Status: DC | PRN
  Administered 2011-09-09: 6 mL via INTRAVENOUS

## 2011-09-09 MED ORDER — SODIUM CHLORIDE 0.9 % IR SOLN
Status: DC | PRN
  Administered 2011-09-09: 1000 mL

## 2011-09-09 MED ORDER — FENTANYL CITRATE 0.05 MG/ML IJ SOLN
INTRAMUSCULAR | Status: DC | PRN
  Administered 2011-09-09 (×3): 50 ug via INTRAVENOUS
  Administered 2011-09-09: 100 ug via INTRAVENOUS

## 2011-09-09 MED ORDER — ROCURONIUM BROMIDE 100 MG/10ML IV SOLN
INTRAVENOUS | Status: DC | PRN
  Administered 2011-09-09: 10 mg via INTRAVENOUS
  Administered 2011-09-09: 50 mg via INTRAVENOUS

## 2011-09-09 MED ORDER — ONDANSETRON HCL 4 MG/2ML IJ SOLN
INTRAMUSCULAR | Status: DC | PRN
  Administered 2011-09-09 (×2): 4 mg via INTRAVENOUS

## 2011-09-09 MED ORDER — LACTATED RINGERS IV SOLN
INTRAVENOUS | Status: DC | PRN
  Administered 2011-09-09: 12:00:00 via INTRAVENOUS

## 2011-09-09 MED ORDER — OXYCODONE-ACETAMINOPHEN 5-325 MG PO TABS
1.0000 | ORAL_TABLET | ORAL | Status: DC | PRN
  Administered 2011-09-09 – 2011-09-10 (×2): 2 via ORAL
  Filled 2011-09-09 (×2): qty 2

## 2011-09-09 MED ORDER — MIDAZOLAM HCL 5 MG/5ML IJ SOLN
INTRAMUSCULAR | Status: DC | PRN
  Administered 2011-09-09: 1 mg via INTRAVENOUS

## 2011-09-09 MED ORDER — HYDROMORPHONE HCL PF 1 MG/ML IJ SOLN
0.2500 mg | INTRAMUSCULAR | Status: DC | PRN
  Administered 2011-09-09 (×4): 0.5 mg via INTRAVENOUS

## 2011-09-09 MED ORDER — POTASSIUM CHLORIDE IN NACL 20-0.45 MEQ/L-% IV SOLN
INTRAVENOUS | Status: DC
  Administered 2011-09-09 – 2011-09-10 (×2): via INTRAVENOUS
  Filled 2011-09-09 (×3): qty 1000

## 2011-09-09 MED ORDER — NEOSTIGMINE METHYLSULFATE 1 MG/ML IJ SOLN
INTRAMUSCULAR | Status: DC | PRN
  Administered 2011-09-09: 5 mg via INTRAVENOUS

## 2011-09-09 MED ORDER — GLYCOPYRROLATE 0.2 MG/ML IJ SOLN
INTRAMUSCULAR | Status: DC | PRN
  Administered 2011-09-09: .6 mg via INTRAVENOUS

## 2011-09-09 MED ORDER — ONDANSETRON HCL 4 MG/2ML IJ SOLN: 4.0000 mg | Freq: Once | INTRAMUSCULAR | Status: DC | PRN

## 2011-09-09 MED ORDER — BUPIVACAINE-EPINEPHRINE 0.25% -1:200000 IJ SOLN
INTRAMUSCULAR | Status: DC | PRN
  Administered 2011-09-09: 27 mL

## 2011-09-09 MED ORDER — 0.9 % SODIUM CHLORIDE (POUR BTL) OPTIME
TOPICAL | Status: DC | PRN
  Administered 2011-09-09: 1000 mL

## 2011-09-09 MED ORDER — PHENYLEPHRINE HCL 10 MG/ML IJ SOLN
INTRAMUSCULAR | Status: DC | PRN
  Administered 2011-09-09: 40 ug via INTRAVENOUS
  Administered 2011-09-09: 80 ug via INTRAVENOUS

## 2011-09-09 MED ORDER — PROPOFOL 10 MG/ML IV EMUL
INTRAVENOUS | Status: DC | PRN
  Administered 2011-09-09: 20 mg via INTRAVENOUS
  Administered 2011-09-09: 30 mg via INTRAVENOUS
  Administered 2011-09-09: 150 mg via INTRAVENOUS
  Administered 2011-09-09: 50 mg via INTRAVENOUS

## 2011-09-09 MED ORDER — LACTATED RINGERS IV SOLN
INTRAVENOUS | Status: DC
  Administered 2011-09-09: 12:00:00 via INTRAVENOUS

## 2011-09-09 SURGICAL SUPPLY — 52 items
ADH SKN CLS APL DERMABOND .7 (GAUZE/BANDAGES/DRESSINGS) ×1
APPLIER CLIP 5 13 M/L LIGAMAX5 (MISCELLANEOUS)
APPLIER CLIP ROT 10 11.4 M/L (STAPLE) ×2
APR CLP MED LRG 11.4X10 (STAPLE) ×1
APR CLP MED LRG 5 ANG JAW (MISCELLANEOUS)
BAG SPEC RTRVL LRG 6X4 10 (ENDOMECHANICALS)
BLADE SURG ROTATE 9660 (MISCELLANEOUS) ×1 IMPLANT
CANISTER SUCTION 2500CC (MISCELLANEOUS) ×2 IMPLANT
CATH REDDICK CHOLANGI 4FR 50CM (CATHETERS) ×1 IMPLANT
CHLORAPREP W/TINT 26ML (MISCELLANEOUS) ×2 IMPLANT
CLIP APPLIE 5 13 M/L LIGAMAX5 (MISCELLANEOUS) ×1 IMPLANT
CLIP APPLIE ROT 10 11.4 M/L (STAPLE) IMPLANT
CLOTH BEACON ORANGE TIMEOUT ST (SAFETY) ×2 IMPLANT
COVER MAYO STAND STRL (DRAPES) ×2 IMPLANT
COVER SURGICAL LIGHT HANDLE (MISCELLANEOUS) ×2 IMPLANT
DECANTER SPIKE VIAL GLASS SM (MISCELLANEOUS) ×4 IMPLANT
DERMABOND ADVANCED (GAUZE/BANDAGES/DRESSINGS) ×1
DERMABOND ADVANCED .7 DNX12 (GAUZE/BANDAGES/DRESSINGS) ×1 IMPLANT
DRAPE C-ARM 42X72 X-RAY (DRAPES) ×2 IMPLANT
DRAPE UTILITY 15X26 W/TAPE STR (DRAPE) ×4 IMPLANT
ELECT REM PT RETURN 9FT ADLT (ELECTROSURGICAL) ×2
ELECTRODE REM PT RTRN 9FT ADLT (ELECTROSURGICAL) ×1 IMPLANT
GLOVE BIO SURGEON STRL SZ7.5 (GLOVE) ×1 IMPLANT
GLOVE BIO SURGEON STRL SZ8 (GLOVE) ×1 IMPLANT
GLOVE BIOGEL PI IND STRL 7.0 (GLOVE) IMPLANT
GLOVE BIOGEL PI IND STRL 7.5 (GLOVE) IMPLANT
GLOVE BIOGEL PI IND STRL 8 (GLOVE) ×1 IMPLANT
GLOVE BIOGEL PI INDICATOR 7.0 (GLOVE) ×1
GLOVE BIOGEL PI INDICATOR 7.5 (GLOVE) ×2
GLOVE BIOGEL PI INDICATOR 8 (GLOVE) ×2
GLOVE ECLIPSE 6.5 STRL STRAW (GLOVE) ×2 IMPLANT
GLOVE ECLIPSE 7.5 STRL STRAW (GLOVE) ×2 IMPLANT
GOWN PREVENTION PLUS XLARGE (GOWN DISPOSABLE) ×1 IMPLANT
GOWN STRL NON-REIN LRG LVL3 (GOWN DISPOSABLE) ×6 IMPLANT
IV CATH 14GX2 1/4 (CATHETERS) ×1 IMPLANT
KIT BASIN OR (CUSTOM PROCEDURE TRAY) ×2 IMPLANT
KIT ROOM TURNOVER OR (KITS) ×2 IMPLANT
NS IRRIG 1000ML POUR BTL (IV SOLUTION) ×2 IMPLANT
PAD ARMBOARD 7.5X6 YLW CONV (MISCELLANEOUS) ×3 IMPLANT
POUCH SPECIMEN RETRIEVAL 10MM (ENDOMECHANICALS) IMPLANT
SCISSORS LAP 5X35 DISP (ENDOMECHANICALS) IMPLANT
SET CHOLANGIOGRAPH 5 50 .035 (SET/KITS/TRAYS/PACK) ×2 IMPLANT
SET IRRIG TUBING LAPAROSCOPIC (IRRIGATION / IRRIGATOR) ×2 IMPLANT
SLEEVE ENDOPATH XCEL 5M (ENDOMECHANICALS) ×2 IMPLANT
SPECIMEN JAR SMALL (MISCELLANEOUS) ×2 IMPLANT
SUT MNCRL AB 4-0 PS2 18 (SUTURE) ×3 IMPLANT
TOWEL OR 17X24 6PK STRL BLUE (TOWEL DISPOSABLE) ×2 IMPLANT
TOWEL OR 17X26 10 PK STRL BLUE (TOWEL DISPOSABLE) ×2 IMPLANT
TRAY LAPAROSCOPIC (CUSTOM PROCEDURE TRAY) ×2 IMPLANT
TROCAR XCEL BLUNT TIP 100MML (ENDOMECHANICALS) ×1 IMPLANT
TROCAR XCEL NON-BLD 11X100MML (ENDOMECHANICALS) ×3 IMPLANT
TROCAR XCEL NON-BLD 5MMX100MML (ENDOMECHANICALS) ×2 IMPLANT

## 2011-09-09 NOTE — Progress Notes (Signed)
Report to Schuyler Amor, RN for primary caregiver

## 2011-09-09 NOTE — Anesthesia Postprocedure Evaluation (Signed)
Anesthesia Post Note  Patient: Mason Patton  Procedure(s) Performed: Procedure(s) (LRB): LAPAROSCOPIC CHOLECYSTECTOMY WITH INTRAOPERATIVE CHOLANGIOGRAM (N/A)  Anesthesia type: General  Patient location: PACU  Post pain: Pain level controlled and Adequate analgesia  Post assessment: Post-op Vital signs reviewed, Patient's Cardiovascular Status Stable, Respiratory Function Stable, Patent Airway and Pain level controlled  Last Vitals:  Filed Vitals:   09/09/11 1008  BP: 149/73  Pulse: 63  Temp: 36.3 C  Resp: 18    Post vital signs: Reviewed and stable  Level of consciousness: awake, alert  and oriented  Complications: No apparent anesthesia complications

## 2011-09-09 NOTE — Preoperative (Signed)
Beta Blockers   Reason not to administer Beta Blockers:Not Applicable 

## 2011-09-09 NOTE — Op Note (Signed)
OPERATIVE REPORT  DATE OF OPERATION: 09/08/2011 - 09/09/2011  PATIENT:  Mason Patton  66 y.Patton. male  PRE-OPERATIVE DIAGNOSIS:  gallbladder disease, acute cholecystiits, chlolethiasis  POST-OPERATIVE DIAGNOSIS:  gallbladder disease, acute cholecystits  PROCEDURE:  Procedure(s): LAPAROSCOPIC CHOLECYSTECTOMY WITH INTRAOPERATIVE CHOLANGIOGRAM  SURGEON:  Surgeon(s): Jetty Duhamel, MD Liz Malady, MD  ASSISTANT: Janee Morn, M.D,.  ANESTHESIA:   general  EBL: 50 ml  BLOOD ADMINISTERED: none  DRAINS: none   SPECIMEN:  Source of Specimen:  gallbladder  COUNTS CORRECT:  YES  PROCEDURE DETAILS: The patient was taken to the operating room and placed on the table in the supine position. After an adequate endotracheal anesthetic was administered he was prepped and draped in usual sterile manner exposing the entire abdomen.  Because the patient had had a previous umbilical hernia repair with mesh the access the peritoneal cavity laparoscopically using an Optiview 11 mm cannula through the right upper quadrant. A transverse incision was made using a #15 blade then we used the Optiview cannula was inserted camera light source as he slowly penetrated the subcutaneous tissue the rectus fascia into the peritoneal cavity. This was done without any jerking or disruption of the internal structures.  Once this cannula was in place we connected the carbon dioxide gas then insufflated and gas up to a maximal intra-abdominal pressure of 15 mm of mercury.  Upon inspection we could see omental adhesions to the fascia at the periumbilical site. A subxiphoid 1011 mm cannula was passed under direct vision into the peritoneal cavity. We used the cannula site in order to dissect out the omentum attached to the periumbilical mesh. We subsequently passed an 11 mm cannula above the site of the mesh in the midline. A second right upper quadrant 5 mm cannula was passed under direct vision. We then inserted the  camera with light source into the periumbilical cannula placed the patient reversed Trendelenburg and tilted the left side down.  The gallbladder wall was adhesed to omentum and was taken. We attempted to decompress it with a does not aspirate or which is minimally successful. However we were able to retract the gallbladder towards intra-abdominal wall and the right upper quadrant. In spite of this there were dense adhesions near the infundibulum. We bluntly and with electrocautery dissected out the adhesions around the infundibulum and the medial aspect of the gallbladder. We were able to dissect out what appeared to be the cystic duct however multiple attempts to perform a cholangiogram through a small nick in the antral wall of the cystic duct structure with met with resistance and no flow bilirubin. It is no stones or a valve in place however we cannot perform a cholangiogram. The one attempt that was performed extravasation as the balloon popped out with the cholangiocatheter.  We subsequently dissected out enough of the cystic duct in order to clip it proximally clipped it proximally and distally x3. We transected the cystic duct. The cystic artery was dissected out and clipped proximally distally and transected. The gallbladder itself was densely adhesed to the gallbladder fossa however does significant thickening of the gallbladder wall which is dissecting out the gallbladder bed. We did so maintaining the gallbladder intact with minimal spillage.  We used an Endo Catch bag to retrieve the gallbladder from the super umbilical site. However we did have to open the gallbladder at the skin surface or to retrieve multiple large 2-3 cm pigmented stones from the gallbladder lumen. Pressing gallbladder enough for her to retrieve her  from the side at the supraumbilical site which still had to be enlarged twice its original size. The fascial opening which was left was closed using running #0 Vicryl  suture.  We inspected the gallbladder bed for hemostasis and adequate hemostasis was obtained. No drain was left in place. We irrigated with 3 L of saline solution aspirated of fluid and gas prior to removing all cannulas.  The 311 mm cannula sites were closed at skin level using running subcuticular stitch of 4-0 Monocryl. The only fascial site was closed with the supraumbilical fascial cannula site. We injected the site with quarter percent Marcaine with epi. A total of 27 cc to use. Once all sites were closed we used Dermabond Steri-Strips and Tegaderms to complete our dressings all counts were correct including needle sponges and instruments.  PATIENT DISPOSITION:  PACU - hemodynamically stable.   Mason Patton,Mason Patton 3/4/20132:46 PM

## 2011-09-09 NOTE — Interval H&P Note (Signed)
History and Physical Interval Note:  The patient has had an umbilical hernia repair with mesh previously, and we will need to avoid this area of mesh during this procedure.  I have explained the procedure to him and he wished to proceed.  currenly his symptoms are improved, but still a bit tender in the RUQ.  LFT's are normal.  May not need IOC.  09/09/2011 8:01 AM  Mason Patton  has presented today for surgery, with the diagnosis of gallbladder disease  The various methods of treatment have been discussed with the patient and family. After consideration of risks, benefits and other options for treatment, the patient has consented to  Procedure(s) (LRB): LAPAROSCOPIC CHOLECYSTECTOMY WITH INTRAOPERATIVE CHOLANGIOGRAM (N/A) as a surgical intervention .  The patients' history has been reviewed, patient examined, no change in status, stable for surgery.  I have reviewed the patients' chart and labs.  Questions were answered to the patient's satisfaction.     Mason Patton,Asiya Cutbirth O

## 2011-09-09 NOTE — Transfer of Care (Signed)
Immediate Anesthesia Transfer of Care Note  Patient: Mason Patton  Procedure(s) Performed: Procedure(s) (LRB): LAPAROSCOPIC CHOLECYSTECTOMY WITH INTRAOPERATIVE CHOLANGIOGRAM (N/A)  Patient Location: PACU  Anesthesia Type: General  Level of Consciousness: oriented and sedated  Airway & Oxygen Therapy: Patient Spontanous Breathing and Patient connected to face mask oxygen  Post-op Assessment: Report given to PACU RN, Post -op Vital signs reviewed and unstable, Anesthesiologist notified and Patient moving all extremities  Post vital signs: Reviewed and stable  Complications: No apparent anesthesia complications

## 2011-09-09 NOTE — Anesthesia Procedure Notes (Addendum)
Date/Time: 09/09/2011 12:54 PM Performed by: Malachi Pro   Procedure Name: Intubation Date/Time: 09/09/2011 12:32 PM Performed by: Malachi Pro Pre-anesthesia Checklist: Patient identified, Emergency Drugs available, Suction available, Patient being monitored and Timeout performed Patient Re-evaluated:Patient Re-evaluated prior to inductionOxygen Delivery Method: Circle system utilized Preoxygenation: Pre-oxygenation with 100% oxygen Intubation Type: Inhalational induction with existing ETT Ventilation: Mask ventilation without difficulty and Oral airway inserted - appropriate to patient size Grade View: Grade II Tube type: Oral Tube size: 7.5 mm Number of attempts: 1 Airway Equipment and Method: Stylet Placement Confirmation: ETT inserted through vocal cords under direct vision,  breath sounds checked- equal and bilateral and CO2 detector Secured at: 23 cm Dental Injury: Teeth and Oropharynx as per pre-operative assessment

## 2011-09-09 NOTE — Anesthesia Preprocedure Evaluation (Addendum)
Anesthesia Evaluation  Patient identified by MRN, date of birth, ID band Patient awake    Reviewed: Allergy & Precautions, H&P , NPO status , Patient's Chart, lab work & pertinent test results  Airway Mallampati: II TM Distance: >3 FB     Dental  (+) Teeth Intact   Pulmonary  breath sounds clear to auscultation        Cardiovascular hypertension, Rhythm:Regular Rate:Normal     Neuro/Psych    GI/Hepatic   Endo/Other  Diabetes mellitus-, Type 2, Oral Hypoglycemic Agents  Renal/GU      Musculoskeletal   Abdominal   Peds  Hematology   Anesthesia Other Findings   Reproductive/Obstetrics                          Anesthesia Physical Anesthesia Plan  ASA: III  Anesthesia Plan: General   Post-op Pain Management:    Induction: Intravenous  Airway Management Planned: Oral ETT  Additional Equipment:   Intra-op Plan:   Post-operative Plan: Extubation in OR  Informed Consent: I have reviewed the patients History and Physical, chart, labs and discussed the procedure including the risks, benefits and alternatives for the proposed anesthesia with the patient or authorized representative who has indicated his/her understanding and acceptance.   Dental advisory given  Plan Discussed with:   Anesthesia Plan Comments: (Acute Cholelithiasis Type 2 glucose 106 Htn Obesity  Plan GA  Norval Gable, MD)        Anesthesia Quick Evaluation

## 2011-09-09 NOTE — Progress Notes (Signed)
Abdomen soft;; small incisional areas x5--1 at umbilicus; 1 above umbilicus and 2 to right of umbilicus.  All CDI.

## 2011-09-10 LAB — CBC
MCHC: 33.7 g/dL (ref 30.0–36.0)
MCV: 88.4 fL (ref 78.0–100.0)
Platelets: 265 10*3/uL (ref 150–400)
RDW: 13.5 % (ref 11.5–15.5)
WBC: 13.5 10*3/uL — ABNORMAL HIGH (ref 4.0–10.5)

## 2011-09-10 LAB — GLUCOSE, CAPILLARY
Glucose-Capillary: 131 mg/dL — ABNORMAL HIGH (ref 70–99)
Glucose-Capillary: 178 mg/dL — ABNORMAL HIGH (ref 70–99)

## 2011-09-10 LAB — DIFFERENTIAL
Basophils Absolute: 0 10*3/uL (ref 0.0–0.1)
Basophils Relative: 0 % (ref 0–1)
Eosinophils Absolute: 0.3 10*3/uL (ref 0.0–0.7)
Eosinophils Relative: 2 % (ref 0–5)
Lymphocytes Relative: 14 % (ref 12–46)

## 2011-09-10 LAB — COMPREHENSIVE METABOLIC PANEL
ALT: 33 U/L (ref 0–53)
AST: 31 U/L (ref 0–37)
Albumin: 3.3 g/dL — ABNORMAL LOW (ref 3.5–5.2)
CO2: 26 mEq/L (ref 19–32)
Calcium: 9.3 mg/dL (ref 8.4–10.5)
Creatinine, Ser: 0.81 mg/dL (ref 0.50–1.35)
Sodium: 141 mEq/L (ref 135–145)
Total Protein: 7.1 g/dL (ref 6.0–8.3)

## 2011-09-10 MED ORDER — OXYCODONE-ACETAMINOPHEN 5-325 MG PO TABS: 1.0000 | ORAL_TABLET | ORAL | Status: DC | PRN

## 2011-09-10 MED ORDER — PANTOPRAZOLE SODIUM 40 MG PO TBEC: 40.0000 mg | DELAYED_RELEASE_TABLET | Freq: Every day | ORAL | Status: DC

## 2011-09-10 NOTE — Progress Notes (Signed)
Discharge instructions reviewed with pt and pt's wife and prescription given.  Pt and pt's wife verbalized understanding and had no questions.  Pt discharged in stable condition with wife.  Mason Patton

## 2011-09-10 NOTE — Discharge Summary (Signed)
Physician Discharge Summary  Patient ID: Mason Patton MRN: [id number] DOB/AGE: 1945-10-31 66 y.o.  Admit date: 09/08/2011 Discharge date: 09/10/2011  Admission Diagnoses: Cholecystitis Discharge Diagnoses:  Cholecystitis    Procedures: Procedure(s): LAPAROSCOPIC CHOLECYSTECTOMY WITH INTRAOPERATIVE CHOLANGIOGRAM  Discharged Condition: good  Hospital Course: HPI: 66 yo male presents with a 5 day history of upper abdominal pain, Right greater than left, associated with nausea/vomiting/ poor appetite. The patient reports pain worsening with food. Limited PO intake this week. Significant abdominal bloating.  Workup in ER finds evidence of cholecystitis He was evaluated by Dr. Corliss Skains and admitted with plans for surgical intervention. He was taken to the OR on 3/4 by Dr. Lindie Spruce and underwent lap chole. A cholangiogram could not be done, but his post-op labs were normal. He did well post op with no issues. He is stable for discharge.  Consults: None  Discharge Exam: Blood pressure 141/61, pulse 67, temperature 97.3 F (36.3 C), temperature source Oral, resp. rate 18, height 5\' 10"  (1.778 m), weight 120.793 kg (266 lb 4.8 oz), SpO2 96.00%. Lungs: CTA without w/r/r Heart: Regular Abdomen: soft, ND, appropriately tender   Incisions all c/d/i without erythema or hematoma. Ext: No edema or tenderness   Disposition: To Home  Discharge Orders    Future Orders Please Complete By Expires   Diet Carb Modified      Increase activity slowly      May shower / Bathe      Leave dressing on - Keep it clean, dry, and intact until clinic visit      Call MD for:  redness, tenderness, or signs of infection (pain, swelling, redness, odor or green/yellow discharge around incision site)      Call MD for:  severe uncontrolled pain      Call MD for:  persistant nausea and vomiting      Call MD for:  temperature >100.4        Medication List  As of 09/10/2011  9:17 AM   TAKE these medications         amLODipine 10 MG tablet   Commonly known as: NORVASC   Take 10 mg by mouth daily.      aspirin 81 MG tablet   Take 81 mg by mouth daily.      benazepril 20 MG tablet   Commonly known as: LOTENSIN   Take 20 mg by mouth daily.      glyBURIDE-metformin 5-500 MG per tablet   Commonly known as: GLUCOVANCE   Take 3 tablets by mouth every morning.      levothyroxine 125 MCG tablet   Commonly known as: SYNTHROID, LEVOTHROID   Take 250 mcg by mouth daily.      oxyCODONE-acetaminophen 5-325 MG per tablet   Commonly known as: PERCOCET   Take 1-2 tablets by mouth every 4 (four) hours as needed for pain.      pravastatin 40 MG tablet   Commonly known as: PRAVACHOL   Take 40 mg by mouth daily.      spironolactone 25 MG tablet   Commonly known as: ALDACTONE   Take 25 mg by mouth daily.      triamcinolone ointment 0.1 %   Commonly known as: KENALOG   Apply 1 application topically 2 (two) times daily. For eczema rash      VITAMIN B 12 PO   Take 1 tablet by mouth daily.           Follow-up Information    Follow up with  CCS,MD, MD on 09/17/2011. (D[facility] clinic 3:30pm)    Contact information:   [facility] [address] [place] [address] [phone]          Signed: Alyse Low 09/10/2011, 9:17 AM

## 2011-09-10 NOTE — Progress Notes (Signed)
1 Day Post-Op  Subjective: Doing well. Tol reg diet No N/V Pain control good.  Objective: Vital signs in last 24 hours: Temp:  [97.3 F (36.3 C)-97.8 F (36.6 C)] 97.3 F (36.3 C) (03/05 0606) Pulse Rate:  [63-69] 67  (03/05 0606) Resp:  [18-20] 18  (03/05 0606) BP: (134-161)/(61-73) 141/61 mmHg (03/05 0606) SpO2:  [93 %-99 %] 96 % (03/05 0606) Last BM Date: 09/06/11  Intake/Output this shift:    Physical Exam: BP 141/61  Pulse 67  Temp(Src) 97.3 F (36.3 C) (Oral)  Resp 18  Ht 5\' 10"  (1.778 m)  Wt 120.793 kg (266 lb 4.8 oz)  BMI 38.21 kg/m2  SpO2 96% Lungs: CTA without w/r/r Heart: Regular Abdomen: soft, ND, appropriately tender   Incisions all c/d/i without erythema or hematoma. Ext: No edema or tenderness   Labs: CBC  Basename 09/10/11 0541 09/09/11 0715  WBC 13.5* 9.6  HGB 13.4 13.3  HCT 39.8 38.9*  PLT 265 228   BMET  Basename 09/10/11 0541 09/09/11 0715  NA 141 139  K 3.9 4.0  CL 103 103  CO2 26 26  GLUCOSE 113* 94  BUN 13 13  CREATININE 0.81 0.79  CALCIUM 9.3 9.5   LFT  Basename 09/10/11 0541 09/08/11 1415  PROT 7.1 --  ALBUMIN 3.3* --  AST 31 --  ALT 33 --  ALKPHOS 89 --  BILITOT 0.7 --  BILIDIR -- --  IBILI -- --  LIPASE -- 19   PT/INR No results found for this basename: LABPROT:2,INR:2 in the last 72 hours ABG No results found for this basename: PHART:2,PCO2:2,PO2:2,HCO3:2 in the last 72 hours  Studies/Results: US Abdomen Complete  09/08/2011  *RADIOLOGY REPORT*  Clinical Data:  Abdominal pain,  COMPLETE ABDOMINAL ULTRASOUND  Comparison:  None.  Findings:  Gallbladder:  There multiple gallstone within the gallbladder which measuring 10-15 mm.  Gallbladder wall is thickened to 10-12 mm. There is edema within the thickened gallbladder wall.  Positive sonographic Murphy's sign.  Common bile duct:  Normal at 5 mm.  Liver:  No intrahepatic biliary ductal dilatation.  There is diffuse increase echogenicity of the liver.  There is a  focal area of hypoechogenicity measuring 2.1 x 1.6 cm adjacent to the gallbladder fossa which may represent fatty sparing.  IVC:  Appears normal.  Pancreas:  No focal abnormality seen.  Spleen:  Normal size and echogenicity.  Right Kidney:  13.7cm in length.  Hyperechoic focus measuring 7 mm in mid pole likely represents a calculus.  No evidence of hydronephrosis.  Left Kidney:  13.8cm in length.  No evidence of hydronephrosis or stones.  Abdominal aorta:  No aneurysm identified.  IMPRESSION:  1.  Multiple gallstones with thickened edematous gallbladder wall and positive Murphy's sign is concerning  acute cholecystitis. 2.  Echogenic liver to suggests  hepatic steatosis.  Hypoechoic area adjacent the gallbladder fossa likely represents focal fatty sparing as this is a typical location. 3.  Right nephrolithiasis.  Original Report Authenticated By: Genevive Bi, M.D.   Dg C-arm 1-60 Min-no Report  09/09/2011  CLINICAL DATA: lap chole, unsuccessful attempt   C-ARM 1-60 MINUTES  Fluoroscopy was utilized by the requesting physician.  No radiographic  interpretation.      Assessment: Active Problems:  * No active hospital problems. *    Procedure(s): LAPAROSCOPIC CHOLECYSTECTOMY WITH INTRAOPERATIVE CHOLANGIOGRAM  Plan: DC home today  LOS: 2 days    Marianna Fuss PA-C 09/10/2011 9:08 AM

## 2011-09-10 NOTE — Progress Notes (Signed)
Patient doing well.  Okay to go home.  Mason Patton. Gae Bon, MD, FACS [phone] [facility]

## 2011-09-10 NOTE — Discharge Summary (Signed)
Okay to go home.  LFTs are normal.  WBC slightly elevated.Marta Lamas Gae Bon, MD, FACS [phone] [facility]

## 2011-09-11 ENCOUNTER — Encounter (HOSPITAL_COMMUNITY): Payer: Self-pay | Admitting: General Surgery

## 2011-09-17 ENCOUNTER — Encounter (INDEPENDENT_AMBULATORY_CARE_PROVIDER_SITE_OTHER): Payer: Self-pay | Admitting: General Surgery

## 2011-09-17 ENCOUNTER — Ambulatory Visit (INDEPENDENT_AMBULATORY_CARE_PROVIDER_SITE_OTHER): Payer: 59 | Admitting: General Surgery

## 2011-09-17 VITALS — BP 162/80 | HR 74 | Temp 97.9°F | Resp 20 | Ht 70.5 in | Wt 271.2 lb

## 2011-09-17 DIAGNOSIS — Z9889 Other specified postprocedural states: Secondary | ICD-10-CM

## 2011-09-17 DIAGNOSIS — K8000 Calculus of gallbladder with acute cholecystitis without obstruction: Secondary | ICD-10-CM

## 2011-09-17 NOTE — Progress Notes (Signed)
[name] 1946/04/07 [id number] 09/17/2011   Mason Patton is a 66 y.o. male who had a laparoscopic cholecystectomy with intraoperative cholangiogram by Dr. Lindie Spruce.  The pathology report confirmed acute on chronic cholecystitis.  The patient reports that they are feeling well with normal bowel movements and good appetite.  The pre-operative symptoms of abdominal pain, nausea, and vomiting have resolved.    Physical examination - Incisions appear to be healing well with no sign of infection or bleeding.  New steri-strips applied as patient is only 8 days post op.   Abdomen - soft, non-tender  Impression:  s/p laparoscopic cholecystectomy  Plan:  He may resume a regular diet and he still needs to be in his 2 week lifting restriction of 15 lbs.  He may return to work in 1 month due to heavy lifting.  He may follow-up on a PRN basis.

## 2011-09-17 NOTE — Patient Instructions (Signed)
Follow up as needed Remove steri strips in one week

## 2013-03-15 ENCOUNTER — Encounter: Payer: Self-pay | Admitting: *Deleted

## 2013-03-16 ENCOUNTER — Encounter: Payer: Self-pay | Admitting: Internal Medicine

## 2013-03-16 ENCOUNTER — Ambulatory Visit (INDEPENDENT_AMBULATORY_CARE_PROVIDER_SITE_OTHER): Payer: 59 | Admitting: Internal Medicine

## 2013-03-16 VITALS — BP 144/72 | HR 59 | Ht 69.5 in | Wt 285.4 lb

## 2013-03-16 DIAGNOSIS — E785 Hyperlipidemia, unspecified: Secondary | ICD-10-CM | POA: Insufficient documentation

## 2013-03-16 DIAGNOSIS — E119 Type 2 diabetes mellitus without complications: Secondary | ICD-10-CM

## 2013-03-16 DIAGNOSIS — I10 Essential (primary) hypertension: Secondary | ICD-10-CM

## 2013-03-16 DIAGNOSIS — R011 Cardiac murmur, unspecified: Secondary | ICD-10-CM | POA: Insufficient documentation

## 2013-03-16 DIAGNOSIS — Z01818 Encounter for other preprocedural examination: Secondary | ICD-10-CM

## 2013-03-16 DIAGNOSIS — Z01810 Encounter for preprocedural cardiovascular examination: Secondary | ICD-10-CM

## 2013-03-16 NOTE — Progress Notes (Signed)
OFFICE NOTE  Chief Complaint:  Routine visit, preoperative cardiac evaluation  Primary Care Physician: Mason Grays, MD  HPI:  Mason Patton is a 67 year old gentleman with a history of tobacco abuse, hypertension, obesity, obstructive sleep apnea, and diabetes type 2 as well as dyslipidemia. I had last seen him in September of 2011, however, he has not followed up in the past 2 years. Recently he had an episode of dizziness and presyncope. He said that 2 episodes actually occurred when looking up, 1 episode not related with head movement. He is unaware of his heart racing or being slow during these episodes, however, he did break out into somewhat of a sweat during the episode and is referred for evaluation of this. I suggested further workup, including an echo or stress test (his last stress test was in 2011 and was negative for ischemia). He declined testing at that time and reports he has had no further episodes. He's become heavily involved in the church and feels that after a time when he was afraid for his symptoms improved markedly. He now no longer reports any episodes of significant shortness of breath. He continues to work in [employer] and was successful at quitting smoking. He says he stopped smoking on May 4 of this year. He also has been active, noting earlier this summer he walked up 167 stairs several times to the roof of a building to help install an air conditioner. He denies any chest pain or shortness of breath during those episodes. He did undergo cholecystectomy electively last year without complications. He is now contemplating left total knee replacement with Dr. Lajoyce Patton.  PMHx:  Past Medical History  Diagnosis Date  . Hypertension   . Diabetes mellitus   . Thyroid disease   . Diabetes mellitus     Type 2  . Obstructive sleep apnea of adult   . Gallstones and inflammation of gallbladder without obstruction   . Cholecystitis with cholelithiasis   . Arthritis   .  Emphysema of lung   . Hyperlipidemia   . Eczema   . Umbilical hernia   . Tobacco abuse     quit 11/08/2012  . Obesity   . History of nuclear stress test 02/23/2010    dipyridamole; normal pattern of perfusion in all regions; normal, low risk study     Past Surgical History  Procedure Laterality Date  . Inguinal hernia repair    . Knee arthroscopy    . Cholecystectomy  09/09/2011    Procedure: LAPAROSCOPIC CHOLECYSTECTOMY WITH INTRAOPERATIVE CHOLANGIOGRAM;  Surgeon: Mason Ridges III, MD;  Location: [facility] OR;  Service: General;  Laterality: N/A;  . Transthoracic echocardiogram  03/28/2005    EF=>55%, mod conc LVH; LA mod dilated & borderline RA enlargement; mild TR; mild pulm valve regurg    FAMHx:  Family History  Problem Relation Age of Onset  . Cancer Maternal Aunt     colon  . Cancer Maternal Aunt     lung  . Thyroid disease Mother   . Hyperlipidemia Father   . Diabetes Maternal Grandmother   . Asthma Child   . Diabetes type I Child     SOCHx:   reports that he quit smoking about 4 months ago. His smoking use included Cigarettes. He has a 25 pack-year smoking history. He has never used smokeless tobacco. He reports that he does not drink alcohol or use illicit drugs.  ALLERGIES:  Allergies  Allergen Reactions  . Codeine Nausea And Vomiting  .  Demerol Nausea And Vomiting    ROS: A comprehensive review of systems was negative except for: Respiratory: positive for cough Musculoskeletal: positive for stiff joints  HOME MEDS: Current Outpatient Prescriptions  Medication Sig Dispense Refill  . amLODipine (NORVASC) 10 MG tablet Take 10 mg by mouth daily.      [name] aspirin 81 MG tablet Take 81 mg by mouth daily.      . benazepril (LOTENSIN) 20 MG tablet Take 20 mg by mouth daily.      [name] glyBURIDE-metformin (GLUCOVANCE) 5-500 MG per tablet Take 3 tablets by mouth every morning.       [name] levothyroxine (SYNTHROID, LEVOTHROID) 125 MCG tablet Take 250 mcg by mouth daily.      .  metoprolol succinate (TOPROL-XL) 100 MG 24 hr tablet Take 50 mg by mouth daily.      . pravastatin (PRAVACHOL) 40 MG tablet Take 40 mg by mouth daily.      [name] spironolactone (ALDACTONE) 25 MG tablet Take 25 mg by mouth daily.      [name] triamcinolone ointment (KENALOG) 0.1 % Apply 1 application topically 2 (two) times daily. For eczema rash       No current facility-administered medications for this visit.    LABS/IMAGING: No results found for this or any previous visit (from the past 48 hour(s)). No results found.  VITALS: BP 144/72  Pulse 59  Ht 5' 9.5" (1.765 m)  Wt 285 lb 6.4 oz (129.457 kg)  BMI 41.56 kg/m2  EXAM: General appearance: alert and no distress Neck: no adenopathy, no carotid bruit, no JVD, supple, symmetrical, trachea midline and thyroid not enlarged, symmetric, no tenderness/mass/nodules Lungs: clear to auscultation bilaterally Heart: regular rate and rhythm, S1, S2 normal and systolic murmur: early systolic 3/6, crescendo at 2nd right intercostal space Abdomen: soft, non-tender; bowel sounds normal; no masses,  no organomegaly Extremities: extremities normal, atraumatic, no cyanosis or edema Pulses: 2+ and symmetric Skin: Skin color, texture, turgor normal. 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 Hypertension-controlled 3.   Dyslipidemia-due for labwork 4.   Preoperative assessment - indeterminate risk for surgery  PLAN: 1.   Mason Patton does not describe any significant anginal symptoms. His functional capacity is pretty good. He does have a louder systolic murmur that I recall, in reviewing echoes in 2006, there was no mention of any significant aortic valve pathology. There was a trivial mitral and tricuspid regurgitation, and some pulmonic regurgitation. I do not feel he needs ischemic testing, but could  benefit from preoperative echocardiogram to assess LV function and further evaluate his systolic murmur. If this is purely aortic sclerosis or mild stenosis, it is worthwhile to note a progression from his prior study in 2006, but will not likely keep him from having surgery.  We should be able to complete this testing later this week and I will send cardiac clearance to Dr. Lajoyce Patton after I review the echo.  Chrystie Nose, MD, [facility] Attending Cardiologist [facility]  Mason Patton 03/16/2013, 8:55 AM

## 2013-03-16 NOTE — Patient Instructions (Signed)
Your physician has requested that you have an echocardiogram. Echocardiography is a painless test that uses sound waves to create images of your heart. It provides your doctor with information about the size and shape of your heart and how well your heart's chambers and valves are working. This procedure takes approximately one hour. There are no restrictions for this procedure. We will call you with the results.   Your physician wants you to follow-up in: 6 months. You will receive a reminder letter in the mail two months in advance. If you don't receive a letter, please call our office to schedule the follow-up appointment.

## 2013-03-19 ENCOUNTER — Encounter (HOSPITAL_COMMUNITY): Payer: Self-pay | Admitting: Pharmacy Technician

## 2013-03-22 ENCOUNTER — Inpatient Hospital Stay (HOSPITAL_COMMUNITY)
Admission: EM | Admit: 2013-03-22 | Discharge: 2013-03-30 | DRG: 094 | Disposition: A | Discharge status: Home Health | Payer: 59 | Attending: Family Medicine | Admitting: Family Medicine

## 2013-03-22 ENCOUNTER — Emergency Department (INDEPENDENT_AMBULATORY_CARE_PROVIDER_SITE_OTHER)
Admission: EM | Admit: 2013-03-22 | Discharge: 2013-03-22 | Disposition: A | Discharge status: Home / Self Care | Payer: 59 | Source: Home / Self Care | Attending: Family Medicine | Admitting: Family Medicine

## 2013-03-22 ENCOUNTER — Emergency Department (HOSPITAL_COMMUNITY): Payer: 59

## 2013-03-22 ENCOUNTER — Encounter (HOSPITAL_COMMUNITY): Payer: Self-pay | Admitting: Emergency Medicine

## 2013-03-22 ENCOUNTER — Encounter (HOSPITAL_COMMUNITY): Payer: Self-pay | Admitting: *Deleted

## 2013-03-22 ENCOUNTER — Ambulatory Visit (HOSPITAL_BASED_OUTPATIENT_CLINIC_OR_DEPARTMENT_OTHER)
Admission: RE | Admit: 2013-03-22 | Discharge: 2013-03-22 | Disposition: A | Discharge status: Home / Self Care | Payer: 59 | Source: Ambulatory Visit | Attending: Internal Medicine | Admitting: Internal Medicine

## 2013-03-22 DIAGNOSIS — I6529 Occlusion and stenosis of unspecified carotid artery: Secondary | ICD-10-CM | POA: Diagnosis present

## 2013-03-22 DIAGNOSIS — R011 Cardiac murmur, unspecified: Secondary | ICD-10-CM

## 2013-03-22 DIAGNOSIS — Z01810 Encounter for preprocedural cardiovascular examination: Secondary | ICD-10-CM

## 2013-03-22 DIAGNOSIS — Z23 Encounter for immunization: Secondary | ICD-10-CM

## 2013-03-22 DIAGNOSIS — E669 Obesity, unspecified: Secondary | ICD-10-CM

## 2013-03-22 DIAGNOSIS — E039 Hypothyroidism, unspecified: Secondary | ICD-10-CM | POA: Diagnosis present

## 2013-03-22 DIAGNOSIS — G039 Meningitis, unspecified: Secondary | ICD-10-CM

## 2013-03-22 DIAGNOSIS — G009 Bacterial meningitis, unspecified: Secondary | ICD-10-CM

## 2013-03-22 DIAGNOSIS — I6350 Cerebral infarction due to unspecified occlusion or stenosis of unspecified cerebral artery: Secondary | ICD-10-CM | POA: Diagnosis present

## 2013-03-22 DIAGNOSIS — R202 Paresthesia of skin: Secondary | ICD-10-CM

## 2013-03-22 DIAGNOSIS — E079 Disorder of thyroid, unspecified: Secondary | ICD-10-CM

## 2013-03-22 DIAGNOSIS — Z9989 Dependence on other enabling machines and devices: Secondary | ICD-10-CM

## 2013-03-22 DIAGNOSIS — E1169 Type 2 diabetes mellitus with other specified complication: Secondary | ICD-10-CM | POA: Diagnosis present

## 2013-03-22 DIAGNOSIS — Z87891 Personal history of nicotine dependence: Secondary | ICD-10-CM

## 2013-03-22 DIAGNOSIS — Z7982 Long term (current) use of aspirin: Secondary | ICD-10-CM

## 2013-03-22 DIAGNOSIS — R4182 Altered mental status, unspecified: Secondary | ICD-10-CM

## 2013-03-22 DIAGNOSIS — E119 Type 2 diabetes mellitus without complications: Secondary | ICD-10-CM | POA: Diagnosis present

## 2013-03-22 DIAGNOSIS — R200 Anesthesia of skin: Secondary | ICD-10-CM

## 2013-03-22 DIAGNOSIS — Z79899 Other long term (current) drug therapy: Secondary | ICD-10-CM

## 2013-03-22 DIAGNOSIS — Z6839 Body mass index (BMI) 39.0-39.9, adult: Secondary | ICD-10-CM

## 2013-03-22 DIAGNOSIS — E785 Hyperlipidemia, unspecified: Secondary | ICD-10-CM

## 2013-03-22 DIAGNOSIS — R739 Hyperglycemia, unspecified: Secondary | ICD-10-CM

## 2013-03-22 DIAGNOSIS — R0602 Shortness of breath: Secondary | ICD-10-CM

## 2013-03-22 DIAGNOSIS — K8010 Calculus of gallbladder with chronic cholecystitis without obstruction: Secondary | ICD-10-CM

## 2013-03-22 DIAGNOSIS — J438 Other emphysema: Secondary | ICD-10-CM | POA: Diagnosis present

## 2013-03-22 DIAGNOSIS — Z01818 Encounter for other preprocedural examination: Secondary | ICD-10-CM

## 2013-03-22 DIAGNOSIS — R509 Fever, unspecified: Secondary | ICD-10-CM

## 2013-03-22 DIAGNOSIS — I10 Essential (primary) hypertension: Secondary | ICD-10-CM

## 2013-03-22 DIAGNOSIS — G4733 Obstructive sleep apnea (adult) (pediatric): Secondary | ICD-10-CM

## 2013-03-22 DIAGNOSIS — G9340 Encephalopathy, unspecified: Secondary | ICD-10-CM

## 2013-03-22 DIAGNOSIS — G0490 Encephalitis and encephalomyelitis, unspecified: Secondary | ICD-10-CM

## 2013-03-22 DIAGNOSIS — I639 Cerebral infarction, unspecified: Secondary | ICD-10-CM

## 2013-03-22 LAB — CBC
Hemoglobin: 14.2 g/dL (ref 13.0–17.0)
MCH: 30.7 pg (ref 26.0–34.0)
Platelets: 216 10*3/uL (ref 150–400)
RBC: 4.62 MIL/uL (ref 4.22–5.81)
WBC: 9.6 10*3/uL (ref 4.0–10.5)

## 2013-03-22 LAB — COMPREHENSIVE METABOLIC PANEL
ALT: 18 U/L (ref 0–53)
AST: 16 U/L (ref 0–37)
Alkaline Phosphatase: 65 U/L (ref 39–117)
CO2: 26 mEq/L (ref 19–32)
Chloride: 100 mEq/L (ref 96–112)
GFR calc Af Amer: >90 mL/min (ref >90)
GFR calc non Af Amer: 86 mL/min — ABNORMAL LOW (ref >90)
Glucose, Bld: 54 mg/dL — ABNORMAL LOW (ref 70–99)
Potassium: 4 mEq/L (ref 3.5–5.1)
Sodium: 136 mEq/L (ref 135–145)
Total Bilirubin: 0.9 mg/dL (ref 0.3–1.2)

## 2013-03-22 MED ORDER — SODIUM CHLORIDE 0.9 % IV BOLUS (SEPSIS)
1000.0000 mL | Freq: Once | INTRAVENOUS | Status: AC
  Administered 2013-03-22: 1000 mL via INTRAVENOUS

## 2013-03-22 MED ORDER — ACETAMINOPHEN 650 MG RE SUPP
650.0000 mg | Freq: Once | RECTAL | Status: AC
  Administered 2013-03-22: 650 mg via RECTAL
  Filled 2013-03-22: qty 1

## 2013-03-22 MED ORDER — DEXTROSE 50 % IV SOLN: 50.0000 mL | Freq: Once | INTRAVENOUS | Status: DC

## 2013-03-22 MED ORDER — DEXTROSE 50 % IV SOLN
25.0000 g | Freq: Once | INTRAVENOUS | Status: AC
  Administered 2013-03-22: 25 g via INTRAVENOUS
  Filled 2013-03-22: qty 50

## 2013-03-22 NOTE — ED Notes (Signed)
Pt reports "having a hard time breathing" that began last Friday, pt recently dx w/ sinusitis and on amoxicillin and allegra - pt also admits to fever at home. Pt seen at [facility] today and was referred to ED for shortness of breath, low grade fever, altered mental status and low oxygen saturation. Upon assessment pt having difficulty finding words to answer questions, per pt's wife reports pt is confused that began today approx 11:00am. Pt admits to headache that began last week.

## 2013-03-22 NOTE — ED Provider Notes (Signed)
CSN: [id number]     Arrival date & time 03/22/13  1947 History   First MD Initiated Contact with Patient 03/22/13 1957     Chief Complaint  Patient presents with  . Sinusitis   (Consider location/radiation/quality/duration/timing/severity/associated sxs/prior Treatment) HPI Comments: 67 year old male presents for evaluation of sinusitis, not improving with oral antibiotic therapy. He is being treated with amoxicillin by his primary care provider. Over the past few days, he has grown increasingly short of breath, he still has a fever, and his wife says that he has an altered mental status and seems very confused. He says it is difficult to breathe right now. He feels very hot and cold intermittently.  Patient is a 67 y.o. male presenting with sinusitis.  Sinusitis Associated symptoms: chills, cough, fatigue, fever and shortness of breath   Associated symptoms: no chest pain, no nausea, no sore throat and no vomiting     Past Medical History  Diagnosis Date  . Hypertension   . Diabetes mellitus   . Thyroid disease   . Diabetes mellitus     Type 2  . Obstructive sleep apnea of adult   . Gallstones and inflammation of gallbladder without obstruction   . Cholecystitis with cholelithiasis   . Arthritis   . Emphysema of lung   . Hyperlipidemia   . Eczema   . Umbilical hernia   . Tobacco abuse     quit 11/08/2012  . Obesity   . History of nuclear stress test 02/23/2010    dipyridamole; normal pattern of perfusion in all regions; normal, low risk study    Past Surgical History  Procedure Laterality Date  . Inguinal hernia repair    . Knee arthroscopy    . Cholecystectomy  09/09/2011    Procedure: LAPAROSCOPIC CHOLECYSTECTOMY WITH INTRAOPERATIVE CHOLANGIOGRAM;  Surgeon: Cherylynn Ridges III, MD;  Location: [facility] OR;  Service: General;  Laterality: N/A;  . Transthoracic echocardiogram  03/28/2005    EF=>55%, mod conc LVH; LA mod dilated & borderline RA enlargement; mild TR; mild pulm valve  regurg   Family History  Problem Relation Age of Onset  . Cancer Maternal Aunt     colon  . Cancer Maternal Aunt     lung  . Thyroid disease Mother   . Hyperlipidemia Father   . Diabetes Maternal Grandmother   . Asthma Child   . Diabetes type I Child    History  Substance Use Topics  . Smoking status: Former Smoker -- 0.50 packs/day for 50 years    Types: Cigarettes    Quit date: 11/08/2012  . Smokeless tobacco: Never Used  . Alcohol Use: No    Review of Systems  Constitutional: Positive for fever, chills and fatigue.  HENT: Negative for sore throat, neck pain and neck stiffness.   Eyes: Negative for visual disturbance.  Respiratory: Positive for cough and shortness of breath.   Cardiovascular: Negative for chest pain, palpitations and leg swelling.  Gastrointestinal: Negative for nausea, vomiting, abdominal pain, diarrhea and constipation.  Genitourinary: Negative for dysuria, urgency, frequency and hematuria.  Musculoskeletal: Negative for myalgias and arthralgias.  Skin: Negative for rash.  Neurological: Positive for dizziness. Negative for weakness and light-headedness.       Altered mental status    Allergies  Codeine and Demerol  Home Medications   Current Outpatient Rx  Name  Route  Sig  Dispense  Refill  . amLODipine (NORVASC) 10 MG tablet   Oral   Take 10 mg by mouth daily.         [name]  amoxicillin-clavulanate (AUGMENTIN) 875-125 MG per tablet   Oral   Take 1 tablet by mouth 2 (two) times daily. For 10 days start 9.11.14 end 9.21.14         . aspirin 81 MG tablet   Oral   Take 81 mg by mouth daily.         . benazepril (LOTENSIN) 40 MG tablet   Oral   Take 40 mg by mouth 2 (two) times daily.         [name] glyBURIDE-metformin (GLUCOVANCE) 5-500 MG per tablet   Oral   Take 1 tablet by mouth 3 (three) times daily.          [name] levothyroxine (SYNTHROID, LEVOTHROID) 300 MCG tablet   Oral   Take 300 mcg by mouth daily before breakfast.          . metoprolol succinate (TOPROL-XL) 100 MG 24 hr tablet   Oral   Take 100 mg by mouth daily.          . pravastatin (PRAVACHOL) 40 MG tablet   Oral   Take 40 mg by mouth daily.         [name] spironolactone (ALDACTONE) 25 MG tablet   Oral   Take 25 mg by mouth daily.          BP 197/75  Pulse 78  Temp(Src) 100.2 F (37.9 C) (Oral)  Resp 40  SpO2 93% Physical Exam  Nursing note and vitals reviewed. Constitutional: He is oriented to person, place, and time. He appears well-developed and well-nourished. No distress.  Obese habitus  HENT:  Head: Normocephalic and atraumatic.  Cardiovascular: Normal rate.   Pulmonary/Chest: Tachypnea noted. He is in respiratory distress. He has wheezes.  Neurological: He is alert and oriented to person, place, and time. Coordination normal.  Skin: Skin is warm and dry. No rash noted. He is not diaphoretic.  Psychiatric: He has a normal mood and affect. Judgment normal.    ED Course  Procedures (including critical care time) Labs Review Labs Reviewed - No data to display Imaging Review No results found.  MDM   1. Shortness of breath   2. Altered mental status    Patient with fever, shortness of breath and altered mental status. Transferred to the emergency department for further evaluation.    Graylon Good, PA-C 03/22/13 2017

## 2013-03-22 NOTE — ED Provider Notes (Signed)
CSN: [id number]     Arrival date & time 03/22/13  2033 History   First MD Initiated Contact with Patient 03/22/13 2252     Chief Complaint  Patient presents with  . Shortness of Breath  . Altered Mental Status   (Consider location/radiation/quality/duration/timing/severity/associated sxs/prior Treatment) HPI Comments: This patient is a 67 year old male with medical history significant for diabetes, hypertension, thyroid disease. He presents to the emergency department with complaints of fever, shortness of breath, and confusion. He was diagnosed with sinusitis by his primary physician 3 days ago and was started on amoxicillin. Since that time he has developed fever and this evening became somewhat confused. He denies any headache or neck pain. He denies any productive cough but has felt more short of breath. He was seen at urgent care and sent here for further workup.  Patient is a 67 y.o. male presenting with shortness of breath. The history is provided by the patient.  Shortness of Breath Severity:  Moderate Onset quality:  Gradual Duration:  3 days Timing:  Constant Progression:  Worsening Chronicity:  New Context: activity   Relieved by:  Nothing Worsened by:  Nothing tried Ineffective treatments:  None tried   Past Medical History  Diagnosis Date  . Hypertension   . Diabetes mellitus   . Thyroid disease   . Diabetes mellitus     Type 2  . Obstructive sleep apnea of adult   . Gallstones and inflammation of gallbladder without obstruction   . Cholecystitis with cholelithiasis   . Arthritis   . Emphysema of lung   . Hyperlipidemia   . Eczema   . Umbilical hernia   . Tobacco abuse     quit 11/08/2012  . Obesity   . History of nuclear stress test 02/23/2010    dipyridamole; normal pattern of perfusion in all regions; normal, low risk study    Past Surgical History  Procedure Laterality Date  . Inguinal hernia repair    . Knee arthroscopy    . Cholecystectomy  09/09/2011     Procedure: LAPAROSCOPIC CHOLECYSTECTOMY WITH INTRAOPERATIVE CHOLANGIOGRAM;  Surgeon: Cherylynn Ridges III, MD;  Location: [facility] OR;  Service: General;  Laterality: N/A;  . Transthoracic echocardiogram  03/28/2005    EF=>55%, mod conc LVH; LA mod dilated & borderline RA enlargement; mild TR; mild pulm valve regurg   Family History  Problem Relation Age of Onset  . Cancer Maternal Aunt     colon  . Cancer Maternal Aunt     lung  . Thyroid disease Mother   . Hyperlipidemia Father   . Diabetes Maternal Grandmother   . Asthma Child   . Diabetes type I Child    History  Substance Use Topics  . Smoking status: Former Smoker -- 0.50 packs/day for 50 years    Types: Cigarettes    Quit date: 11/08/2012  . Smokeless tobacco: Never Used  . Alcohol Use: No    Review of Systems  Respiratory: Positive for shortness of breath.   All other systems reviewed and are negative.    Allergies  Codeine and Demerol  Home Medications   Current Outpatient Rx  Name  Route  Sig  Dispense  Refill  . amLODipine (NORVASC) 10 MG tablet   Oral   Take 10 mg by mouth daily.         [name] amoxicillin-clavulanate (AUGMENTIN) 875-125 MG per tablet   Oral   Take 1 tablet by mouth 2 (two) times daily. For 10 days start  9.11.14 end 9.21.14         . aspirin 81 MG tablet   Oral   Take 81 mg by mouth daily.         . benazepril (LOTENSIN) 40 MG tablet   Oral   Take 40 mg by mouth 2 (two) times daily.         . fexofenadine (ALLEGRA) 180 MG tablet   Oral   Take 180 mg by mouth daily.         [name] glyBURIDE-metformin (GLUCOVANCE) 5-500 MG per tablet   Oral   Take 1 tablet by mouth 3 (three) times daily.          [name] levothyroxine (SYNTHROID, LEVOTHROID) 300 MCG tablet   Oral   Take 300 mcg by mouth daily before breakfast.         . metoprolol succinate (TOPROL-XL) 100 MG 24 hr tablet   Oral   Take 100 mg by mouth daily.          . pravastatin (PRAVACHOL) 40 MG tablet   Oral   Take 40 mg  by mouth daily.         [name] spironolactone (ALDACTONE) 25 MG tablet   Oral   Take 25 mg by mouth daily.          BP 161/75  Pulse 73  Temp(Src) 102.7 F (39.3 C) (Rectal)  Resp 26  SpO2 100% Physical Exam  Nursing note and vitals reviewed. Constitutional: He appears well-developed and well-nourished. No distress.  HENT:  Head: Normocephalic and atraumatic.  Mouth/Throat: Oropharynx is clear and moist.  Eyes: EOM are normal. Pupils are equal, round, and reactive to light.  Neck: Normal range of motion. Neck supple.  Cardiovascular: Normal rate, regular rhythm and normal heart sounds.   No murmur heard. Pulmonary/Chest: Effort normal and breath sounds normal. No respiratory distress. He has no wheezes.  Abdominal: Soft. Bowel sounds are normal. He exhibits no distension. There is no tenderness.  Musculoskeletal: Normal range of motion. He exhibits no edema.  Lymphadenopathy:    He has no cervical adenopathy.  Neurological: He is alert. No cranial nerve deficit. He exhibits normal muscle tone. Coordination normal.  The patient is alert and interactive. He does appear somewhat confused tells me he believes it is 1940. He is otherwise oriented to person place and situation.  Skin: Skin is warm and dry. He is not diaphoretic.    ED Course  Procedures (including critical care time) Labs Review Labs Reviewed  GLUCOSE, CAPILLARY - Abnormal; Notable for the following:    Glucose-Capillary 45 (*)    All other components within normal limits  CULTURE, BLOOD (ROUTINE X 2)  CULTURE, BLOOD (ROUTINE X 2)  URINE CULTURE  CBC  COMPREHENSIVE METABOLIC PANEL  TROPONIN I  URINALYSIS, ROUTINE W REFLEX MICROSCOPIC  LACTIC ACID, PLASMA   Imaging Review Dg Chest 2 View (if Patient Has Fever And/or Copd)  03/22/2013   CLINICAL DATA:  Shortness of breath and altered mental status  EXAM: CHEST  2 VIEW  COMPARISON:  01/23/2010  FINDINGS: Mild cardiomegaly, stable from prior. No change in  upper mediastinal contours. Aortic atherosclerosis.  Negative for acute infiltrate, edema, effusion, or pneumothorax. Hyperinflation with mild interstitial coarsening, suggesting chronic lung disease, possibly COPD.  IMPRESSION: No active cardiopulmonary disease.   Electronically Signed   By: Tiburcio Pea   On: 03/22/2013 21:40    Date: 03/23/2013  Rate: 75  Rhythm: normal sinus rhythm  QRS Axis: normal  Intervals: normal  ST/T Wave abnormalities: normal  Conduction Disutrbances:none  Narrative Interpretation:   Old EKG Reviewed: none available   MDM  No diagnosis found. Patient presents here with fever and altered mental status. This seemed to occur gradually over the past few days. He was seen at urgent care and diagnosed with probable bronchitis. He returned there today as he was not feeling better. They sent him here for further workup to the fact that he was febrile and seemed confused. He denied any headache or stiff neck and physical exam was otherwise unremarkable. Workup reveals no leukocytosis or electrolyte abnormality. CT and attempted an LP was made by myself however no spinal fluid was able to reobtain. Patient will be admitted to the internal medicine service for further evaluation and workup. He may need a CT guided LP. This will be left to the discretion of the hospitalist.     Geoffery Lyons, MD 03/23/13 [phone]

## 2013-03-22 NOTE — ED Provider Notes (Signed)
Medical screening examination/treatment/procedure(s) were performed by resident physician or non-physician practitioner and as supervising physician I was immediately available for consultation/collaboration.   Barkley Bruns MD.   Linna Hoff, MD 03/22/13 2018

## 2013-03-22 NOTE — Progress Notes (Signed)
2D Echo Performed 03/22/2013    Taiga Lupinacci, RCS  

## 2013-03-22 NOTE — ED Notes (Signed)
Recent dx sinusitis. Currently taking amox. And otc allegra. States getting worse.  Low grade temp. Low o2 saturation. Sob.

## 2013-03-22 NOTE — ED Notes (Signed)
ED Doctor at bedside. CBG 45

## 2013-03-22 NOTE — ED Notes (Signed)
Per pt's wife pt was recently treated for sinusitis with antibiotics and allergy medicine but has not been getting better. Pt c/o sob, weakness and confusion that has gotten worse over the last few days. Pt is alert to person, place, and situation but does had difficulty finding words. Pt denies any pain that this time.

## 2013-03-23 ENCOUNTER — Encounter (HOSPITAL_COMMUNITY): Payer: Self-pay | Admitting: *Deleted

## 2013-03-23 ENCOUNTER — Inpatient Hospital Stay (HOSPITAL_COMMUNITY): Payer: 59

## 2013-03-23 ENCOUNTER — Emergency Department (HOSPITAL_COMMUNITY): Payer: 59

## 2013-03-23 ENCOUNTER — Inpatient Hospital Stay (HOSPITAL_COMMUNITY): Admission: RE | Admit: 2013-03-23 | Payer: 59 | Source: Ambulatory Visit

## 2013-03-23 DIAGNOSIS — E669 Obesity, unspecified: Secondary | ICD-10-CM | POA: Diagnosis present

## 2013-03-23 DIAGNOSIS — G039 Meningitis, unspecified: Secondary | ICD-10-CM

## 2013-03-23 DIAGNOSIS — G9340 Encephalopathy, unspecified: Secondary | ICD-10-CM | POA: Diagnosis present

## 2013-03-23 DIAGNOSIS — I10 Essential (primary) hypertension: Secondary | ICD-10-CM

## 2013-03-23 DIAGNOSIS — G4733 Obstructive sleep apnea (adult) (pediatric): Secondary | ICD-10-CM | POA: Diagnosis present

## 2013-03-23 DIAGNOSIS — Z9989 Dependence on other enabling machines and devices: Secondary | ICD-10-CM | POA: Diagnosis present

## 2013-03-23 DIAGNOSIS — R509 Fever, unspecified: Secondary | ICD-10-CM | POA: Diagnosis present

## 2013-03-23 DIAGNOSIS — R4182 Altered mental status, unspecified: Secondary | ICD-10-CM

## 2013-03-23 LAB — CSF CELL COUNT WITH DIFFERENTIAL
Lymphs, CSF: 83 % — ABNORMAL HIGH (ref 40–80)
Monocyte-Macrophage-Spinal Fluid: 15 % (ref 15–45)
RBC Count, CSF: 13 /mm3 — ABNORMAL HIGH
WBC, CSF: 121 /mm3 (ref 0–5)

## 2013-03-23 LAB — URINALYSIS, ROUTINE W REFLEX MICROSCOPIC
Protein, ur: 30 mg/dL — AB
Specific Gravity, Urine: 1.023 (ref 1.005–1.030)
Urobilinogen, UA: 2 mg/dL — ABNORMAL HIGH (ref 0.0–1.0)

## 2013-03-23 LAB — TSH: TSH: 0.105 u[IU]/mL — ABNORMAL LOW (ref 0.350–4.500)

## 2013-03-23 LAB — URINE MICROSCOPIC-ADD ON

## 2013-03-23 LAB — CBC
Platelets: 194 10*3/uL (ref 150–400)
RDW: 12.6 % (ref 11.5–15.5)
WBC: 8.8 10*3/uL (ref 4.0–10.5)

## 2013-03-23 LAB — BASIC METABOLIC PANEL
Chloride: 100 mEq/L (ref 96–112)
GFR calc Af Amer: >90 mL/min (ref >90)
Potassium: 3.9 mEq/L (ref 3.5–5.1)

## 2013-03-23 LAB — GRAM STAIN: Special Requests: 2.1

## 2013-03-23 LAB — GLUCOSE, CAPILLARY
Glucose-Capillary: 79 mg/dL (ref 70–99)
Glucose-Capillary: 71 mg/dL (ref 70–99)

## 2013-03-23 LAB — PROTEIN AND GLUCOSE, CSF
Glucose, CSF: 31 mg/dL — ABNORMAL LOW (ref 43–76)
Total  Protein, CSF: 106 mg/dL — ABNORMAL HIGH (ref 15–45)

## 2013-03-23 MED ORDER — VANCOMYCIN HCL 10 G IV SOLR
2500.0000 mg | Freq: Once | INTRAVENOUS | Status: AC
  Administered 2013-03-23: 2500 mg via INTRAVENOUS
  Filled 2013-03-23: qty 2500

## 2013-03-23 MED ORDER — SENNOSIDES-DOCUSATE SODIUM 8.6-50 MG PO TABS: 1.0000 | ORAL_TABLET | Freq: Every evening | ORAL | Status: DC | PRN

## 2013-03-23 MED ORDER — ACETAMINOPHEN 650 MG RE SUPP
650.0000 mg | Freq: Four times a day (QID) | RECTAL | Status: DC | PRN
  Administered 2013-03-24: 650 mg via RECTAL
  Filled 2013-03-23: qty 1

## 2013-03-23 MED ORDER — SODIUM CHLORIDE 0.9 % IV SOLN
2.0000 g | INTRAVENOUS | Status: DC
  Administered 2013-03-23 – 2013-03-30 (×41): 2 g via INTRAVENOUS
  Filled 2013-03-23 (×50): qty 2000

## 2013-03-23 MED ORDER — LIDOCAINE HCL (PF) 1 % IJ SOLN
INTRAMUSCULAR | Status: AC
  Administered 2013-03-23: 04:00:00
  Filled 2013-03-23: qty 5

## 2013-03-23 MED ORDER — ACETAMINOPHEN 325 MG PO TABS
650.0000 mg | ORAL_TABLET | Freq: Four times a day (QID) | ORAL | Status: DC | PRN
  Administered 2013-03-23 – 2013-03-24 (×2): 650 mg via ORAL
  Filled 2013-03-23 (×3): qty 2

## 2013-03-23 MED ORDER — CEFTRIAXONE SODIUM 2 G IJ SOLR
2.0000 g | Freq: Two times a day (BID) | INTRAMUSCULAR | Status: DC
  Administered 2013-03-23 – 2013-03-30 (×15): 2 g via INTRAVENOUS
  Filled 2013-03-23 (×16): qty 2

## 2013-03-23 MED ORDER — INSULIN ASPART 100 UNIT/ML ~~LOC~~ SOLN
0.0000 [IU] | Freq: Four times a day (QID) | SUBCUTANEOUS | Status: DC
  Administered 2013-03-25: 11 [IU] via SUBCUTANEOUS
  Administered 2013-03-25: 5 [IU] via SUBCUTANEOUS

## 2013-03-23 MED ORDER — ENOXAPARIN SODIUM 40 MG/0.4ML ~~LOC~~ SOLN
40.0000 mg | SUBCUTANEOUS | Status: DC
  Administered 2013-03-24 – 2013-03-30 (×7): 40 mg via SUBCUTANEOUS
  Filled 2013-03-23 (×8): qty 0.4

## 2013-03-23 MED ORDER — AMLODIPINE BESYLATE 10 MG PO TABS
10.0000 mg | ORAL_TABLET | Freq: Every day | ORAL | Status: DC
  Administered 2013-03-23 – 2013-03-30 (×8): 10 mg via ORAL
  Filled 2013-03-23 (×8): qty 1

## 2013-03-23 MED ORDER — ALUM & MAG HYDROXIDE-SIMETH 200-200-20 MG/5ML PO SUSP: 30.0000 mL | Freq: Four times a day (QID) | ORAL | Status: DC | PRN

## 2013-03-23 MED ORDER — DEXAMETHASONE SODIUM PHOSPHATE 10 MG/ML IJ SOLN
10.0000 mg | Freq: Four times a day (QID) | INTRAMUSCULAR | Status: AC
  Administered 2013-03-24 – 2013-03-28 (×15): 10 mg via INTRAVENOUS
  Filled 2013-03-23 (×16): qty 1

## 2013-03-23 MED ORDER — METOPROLOL SUCCINATE ER 100 MG PO TB24
100.0000 mg | ORAL_TABLET | Freq: Every day | ORAL | Status: DC
  Administered 2013-03-23 – 2013-03-29 (×7): 100 mg via ORAL
  Filled 2013-03-23 (×8): qty 1

## 2013-03-23 MED ORDER — GLUCOSE 40 % PO GEL: 1.0000 | ORAL | Status: DC | PRN

## 2013-03-23 MED ORDER — KCL IN DEXTROSE-NACL 20-5-0.9 MEQ/L-%-% IV SOLN
INTRAVENOUS | Status: DC
  Administered 2013-03-23 – 2013-03-24 (×2): via INTRAVENOUS
  Filled 2013-03-23 (×5): qty 1000

## 2013-03-23 MED ORDER — METFORMIN HCL 500 MG PO TABS
500.0000 mg | ORAL_TABLET | Freq: Three times a day (TID) | ORAL | Status: DC
  Administered 2013-03-23 (×2): 500 mg via ORAL
  Filled 2013-03-23 (×4): qty 1

## 2013-03-23 MED ORDER — VANCOMYCIN HCL IN DEXTROSE 1-5 GM/200ML-% IV SOLN
1000.0000 mg | Freq: Three times a day (TID) | INTRAVENOUS | Status: DC
  Administered 2013-03-23 – 2013-03-26 (×10): 1000 mg via INTRAVENOUS
  Filled 2013-03-23 (×12): qty 200

## 2013-03-23 MED ORDER — DEXTROSE 5 % IV SOLN
10.0000 mg/kg | Freq: Three times a day (TID) | INTRAVENOUS | Status: DC
  Administered 2013-03-23 – 2013-03-25 (×5): 940 mg via INTRAVENOUS
  Filled 2013-03-23 (×7): qty 18.8

## 2013-03-23 MED ORDER — BENAZEPRIL HCL 40 MG PO TABS
40.0000 mg | ORAL_TABLET | Freq: Two times a day (BID) | ORAL | Status: DC
  Administered 2013-03-23 – 2013-03-30 (×15): 40 mg via ORAL
  Filled 2013-03-23 (×17): qty 1

## 2013-03-23 MED ORDER — GLYBURIDE-METFORMIN 5-500 MG PO TABS: 1.0000 | ORAL_TABLET | Freq: Three times a day (TID) | ORAL | Status: DC

## 2013-03-23 MED ORDER — ONDANSETRON HCL 4 MG/2ML IJ SOLN
4.0000 mg | Freq: Four times a day (QID) | INTRAMUSCULAR | Status: DC | PRN
  Administered 2013-03-24 – 2013-03-29 (×2): 4 mg via INTRAVENOUS
  Filled 2013-03-23 (×2): qty 2

## 2013-03-23 MED ORDER — ONDANSETRON HCL 4 MG PO TABS: 4.0000 mg | ORAL_TABLET | Freq: Four times a day (QID) | ORAL | Status: DC | PRN

## 2013-03-23 MED ORDER — LEVOTHYROXINE SODIUM 150 MCG PO TABS
300.0000 ug | ORAL_TABLET | Freq: Every day | ORAL | Status: DC
  Administered 2013-03-24: 300 ug via ORAL
  Filled 2013-03-23 (×3): qty 2

## 2013-03-23 MED ORDER — GLYBURIDE 5 MG PO TABS
5.0000 mg | ORAL_TABLET | Freq: Three times a day (TID) | ORAL | Status: DC
  Administered 2013-03-23 (×2): 5 mg via ORAL
  Filled 2013-03-23 (×4): qty 1

## 2013-03-23 MED ORDER — GLUCOSE 40 % PO GEL
ORAL | Status: AC
  Administered 2013-03-23: 22:00:00
  Filled 2013-03-23: qty 1

## 2013-03-23 MED ORDER — POTASSIUM CHLORIDE IN NACL 20-0.9 MEQ/L-% IV SOLN
INTRAVENOUS | Status: DC
  Administered 2013-03-23: 08:00:00 via INTRAVENOUS
  Filled 2013-03-23 (×3): qty 1000

## 2013-03-23 NOTE — Progress Notes (Signed)
ANTIBIOTIC CONSULT NOTE - INITIAL  Pharmacy Consult for vancomycin Indication: r/o meningitis  Allergies  Allergen Reactions  . Codeine Nausea And Vomiting  . Demerol Nausea And Vomiting    Patient Measurements: Height: 5\' 10"  (177.8 cm) Weight: 278 lb (126.1 kg) IBW/kg (Calculated) : 73  Vital Signs: Temp: 99.8 F (37.7 C) (09/16 0532) Temp src: Rectal (09/15 2245) BP: 134/56 mmHg (09/16 0509) Pulse Rate: 71 (09/16 0509)  Labs:  Recent Labs  03/22/13 2250  WBC 9.6  HGB 14.2  PLT 216  CREATININE 0.90   Estimated Creatinine Clearance: 106.1 ml/min (by C-G formula based on Cr of 0.9).   Microbiology: No results found for this or any previous visit (from the past 720 hour(s)).  Medical History: Past Medical History  Diagnosis Date  . Hypertension   . Diabetes mellitus   . Thyroid disease   . Diabetes mellitus     Type 2  . Obstructive sleep apnea of adult   . Gallstones and inflammation of gallbladder without obstruction   . Cholecystitis with cholelithiasis   . Arthritis   . Emphysema of lung   . Hyperlipidemia   . Eczema   . Umbilical hernia   . Tobacco abuse     quit 11/08/2012  . Obesity   . History of nuclear stress test 02/23/2010    dipyridamole; normal pattern of perfusion in all regions; normal, low risk study     Medications:  Prescriptions prior to admission  Medication Sig Dispense Refill  . amLODipine (NORVASC) 10 MG tablet Take 10 mg by mouth daily.      [name] amoxicillin-clavulanate (AUGMENTIN) 875-125 MG per tablet Take 1 tablet by mouth 2 (two) times daily. For 10 days start 9.11.14 end 9.21.14      . aspirin 81 MG tablet Take 81 mg by mouth daily.      . benazepril (LOTENSIN) 40 MG tablet Take 40 mg by mouth 2 (two) times daily.      . fexofenadine (ALLEGRA) 180 MG tablet Take 180 mg by mouth daily.      [name] glyBURIDE-metformin (GLUCOVANCE) 5-500 MG per tablet Take 1 tablet by mouth 3 (three) times daily.       [name] levothyroxine (SYNTHROID,  LEVOTHROID) 300 MCG tablet Take 300 mcg by mouth daily before breakfast.      . metoprolol succinate (TOPROL-XL) 100 MG 24 hr tablet Take 100 mg by mouth daily.       . pravastatin (PRAVACHOL) 40 MG tablet Take 40 mg by mouth daily.      [name] spironolactone (ALDACTONE) 25 MG tablet Take 25 mg by mouth daily.       Scheduled:  . amLODipine  10 mg Oral Daily  . benazepril  40 mg Oral BID  . cefTRIAXone (ROCEPHIN)  IV  2 g Intravenous Q12H  . enoxaparin (LOVENOX) injection  40 mg Subcutaneous Q24H  . glyBURIDE  5 mg Oral TID AC  . insulin aspart  0-15 Units Subcutaneous Q6H  . levothyroxine  300 mcg Oral QAC breakfast  . metFORMIN  500 mg Oral TID AC  . metoprolol succinate  100 mg Oral Daily   Infusions:  . 0.9 % NaCl with KCl 20 mEq / L      Assessment: 67yo male had recent Dx of sinusitis and c/o worsening Sx despite amox and Allegra, c/o continued HA w/ fevers and chills, has uncoordinated gait and now confused, concern for meningitis, to begin IV ABX.  Goal of Therapy:  Vancomycin  trough level 15-20 mcg/ml  Plan:  Will give vancomycin 2500mg  IV x1 followed by gtt at 1000mg  IV Q8H and monitor CBC, Cx, levels prn.  Vernard Gambles, PharmD, BCPS  03/23/2013,6:43 AM

## 2013-03-23 NOTE — Consult Note (Addendum)
INFECTIOUS DISEASE CONSULT NOTE  Date of Admission:  03/22/2013  Date of Consult:  03/23/2013  Reason for Consult: Mental Status Change Referring Physician: Oti  Impression/Recommendation Mental Status Change Meningitis  Would Continue anbx Add acyclovir Send CSF for HSV, enterovirus PCR Send CSF for state arbovirus panel.  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 Watch his Glc while on decadron. Will follow with you. Thank you so much for this interesting consult,   Johny Sax (pager) [phone] [email or web address]  Mason Patton is an 67 y.o. male.  HPI: 67 yo M wihx hx of DM2 who comes to the hospital with 1 week of headaches. He was treated with augmentin and allegra (9-11) by his PCP for a sinus infection. He developed fever and chills, and the on 9-16 became confused. He was found to have Glc 54 in the ED, his confusion id not improve after D50. His temp was 102.7. He had a CT of the head which showed no acute change. He underwent LP showing WBC 121 (83% L), TProt 106, Glc 31. His g/s was (-).  He was started on vanco/ceftriaxone/amp.   Past Medical History  Diagnosis Date  . Hypertension   . Diabetes mellitus   . Thyroid disease   . Diabetes mellitus     Type 2  . Obstructive sleep apnea of adult   . Gallstones and inflammation of gallbladder without obstruction   . Cholecystitis with cholelithiasis   . Arthritis   . Emphysema of lung   . Hyperlipidemia   . Eczema   . Umbilical hernia   . Tobacco abuse     quit 11/08/2012  . Obesity   . History of nuclear stress test 02/23/2010    dipyridamole; normal pattern of perfusion in all regions; normal, low risk study     Past Surgical History  Procedure Laterality Date  . Inguinal hernia repair    . Knee arthroscopy    .  Cholecystectomy  09/09/2011    Procedure: LAPAROSCOPIC CHOLECYSTECTOMY WITH INTRAOPERATIVE CHOLANGIOGRAM;  Surgeon: Cherylynn Ridges III, MD;  Location: [facility] OR;  Service: General;  Laterality: N/A;  . Transthoracic echocardiogram  03/28/2005    EF=>55%, mod conc LVH; LA mod dilated & borderline RA enlargement; mild TR; mild pulm valve regurg     Allergies  Allergen Reactions  . Codeine Nausea And Vomiting  . Demerol Nausea And Vomiting    Medications:  Scheduled: . amLODipine  10 mg Oral Daily  . ampicillin (OMNIPEN) IV  2 g Intravenous Q4H  . benazepril  40 mg Oral BID  . cefTRIAXone (ROCEPHIN)  IV  2 g Intravenous Q12H  . enoxaparin (LOVENOX) injection  40 mg Subcutaneous Q24H  . glyBURIDE  5 mg Oral TID AC  . insulin aspart  0-15 Units Subcutaneous Q6H  . levothyroxine  300 mcg Oral QAC breakfast  . metFORMIN  500 mg Oral TID AC  . metoprolol succinate  100 mg Oral Daily  . vancomycin  1,000 mg Intravenous Q8H    Total days of antibiotics: 1 (vanco/ceftriaxone/amp)          Social History:  reports that he quit smoking about 4 months ago. His smoking use included Cigarettes. He has a 25 pack-year smoking history. He has never used smokeless tobacco. He reports that he does not drink alcohol or use illicit  drugs.  Family History  Problem Relation Age of Onset  . Cancer Maternal Aunt     colon  . Cancer Maternal Aunt     lung  . Thyroid disease Mother   . Hyperlipidemia Father   . Diabetes Maternal Grandmother   . Asthma Child   . Diabetes type I Child     General ROS: no oral ulcers. does outdoor work Radio broadcast assistant). no hx of tick or bug bites. no change in BM. no neuropathy. see HPI.   Blood pressure 152/64, pulse 69, temperature 98.3 F (36.8 C), temperature source Oral, resp. rate 21, height 5\' 10"  (1.778 m), weight 126.1 kg (278 lb), SpO2 98.00%. General appearance: alert, cooperative and no distress Eyes: negative findings: conjunctivae and sclerae normal and pupils equal,  round, reactive to light and accomodation Neck: no adenopathy, supple, symmetrical, trachea midline and FROM, no meningismus Lungs: clear to auscultation bilaterally Heart: regular rate and rhythm Abdomen: normal findings: bowel sounds normal and soft, non-tender Skin: no palmar or pedal rash. he has rash on his LE that wife states is long term (eczema).  Neurologic: Mental status: alert, oriented person, date. place is [place]. he has difficulty with word finding.  Sensory: light touch grossly normal Motor: normal plantar/dorsiflexion.    Results for orders placed during the hospital encounter of 03/22/13 (from the past 48 hour(s))  CBC     Status: None   Collection Time    03/22/13 10:50 PM      Result Value Range   WBC 9.6  4.0 - 10.5 K/uL   RBC 4.62  4.22 - 5.81 MIL/uL   Hemoglobin 14.2  13.0 - 17.0 g/dL   HCT 16.1  09.6 - 04.5 %   MCV 85.5  78.0 - 100.0 fL   MCH 30.7  26.0 - 34.0 pg   MCHC 35.9  30.0 - 36.0 g/dL   RDW 40.9  81.1 - 91.4 %   Platelets 216  150 - 400 K/uL  COMPREHENSIVE METABOLIC PANEL     Status: Abnormal   Collection Time    03/22/13 10:50 PM      Result Value Range   Sodium 136  135 - 145 mEq/L   Potassium 4.0  3.5 - 5.1 mEq/L   Chloride 100  96 - 112 mEq/L   CO2 26  19 - 32 mEq/L   Glucose, Bld 54 (*) 70 - 99 mg/dL   BUN 15  6 - 23 mg/dL   Creatinine, Ser 7.82  0.50 - 1.35 mg/dL   Calcium 95.6  8.4 - 21.3 mg/dL   Total Protein 7.9  6.0 - 8.3 g/dL   Albumin 4.4  3.5 - 5.2 g/dL   AST 16  0 - 37 U/L   ALT 18  0 - 53 U/L   Alkaline Phosphatase 65  39 - 117 U/L   Total Bilirubin 0.9  0.3 - 1.2 mg/dL   GFR calc non Af Amer 86 (*) >90 mL/min   GFR calc Af Amer >90  >90 mL/min   Comment: (NOTE)     The eGFR has been calculated using the CKD EPI equation.     This calculation has not been validated in all clinical situations.     eGFR's persistently <90 mL/min signify possible Chronic Kidney     Disease.  GLUCOSE, CAPILLARY     Status: Abnormal    Collection Time    03/22/13 10:59 PM      Result Value Range   Glucose-Capillary  45 (*) 70 - 99 mg/dL  URINALYSIS, ROUTINE W REFLEX MICROSCOPIC     Status: Abnormal   Collection Time    03/22/13 11:04 PM      Result Value Range   Color, Urine YELLOW  YELLOW   APPearance CLEAR  CLEAR   Specific Gravity, Urine 1.023  1.005 - 1.030   pH 5.5  5.0 - 8.0   Glucose, UA NEGATIVE  NEGATIVE mg/dL   Hgb urine dipstick NEGATIVE  NEGATIVE   Bilirubin Urine SMALL (*) NEGATIVE   Ketones, ur NEGATIVE  NEGATIVE mg/dL   Protein, ur 30 (*) NEGATIVE mg/dL   Urobilinogen, UA 2.0 (*) 0.0 - 1.0 mg/dL   Nitrite NEGATIVE  NEGATIVE   Leukocytes, UA TRACE (*) NEGATIVE  URINE MICROSCOPIC-ADD ON     Status: Abnormal   Collection Time    03/22/13 11:04 PM      Result Value Range   Squamous Epithelial / LPF RARE  RARE   WBC, UA 3-6  <3 WBC/hpf   RBC / HPF 0-2  <3 RBC/hpf   Bacteria, UA RARE  RARE   Crystals CA OXALATE CRYSTALS (*) NEGATIVE   Urine-Other MUCOUS PRESENT    CG4 I-STAT (LACTIC ACID)     Status: None   Collection Time    03/22/13 11:40 PM      Result Value Range   Lactic Acid, Venous 1.29  0.5 - 2.2 mmol/L  GLUCOSE, CAPILLARY     Status: None   Collection Time    03/23/13 12:06 AM      Result Value Range   Glucose-Capillary 75  70 - 99 mg/dL  GLUCOSE, CAPILLARY     Status: None   Collection Time    03/23/13  6:31 AM      Result Value Range   Glucose-Capillary 80  70 - 99 mg/dL  CBC     Status: Abnormal   Collection Time    03/23/13  7:30 AM      Result Value Range   WBC 8.8  4.0 - 10.5 K/uL   RBC 4.21 (*) 4.22 - 5.81 MIL/uL   Hemoglobin 12.6 (*) 13.0 - 17.0 g/dL   HCT 16.1 (*) 09.6 - 04.5 %   MCV 85.5  78.0 - 100.0 fL   MCH 29.9  26.0 - 34.0 pg   MCHC 35.0  30.0 - 36.0 g/dL   RDW 40.9  81.1 - 91.4 %   Platelets 194  150 - 400 K/uL  BASIC METABOLIC PANEL     Status: Abnormal   Collection Time    03/23/13  7:30 AM      Result Value Range   Sodium 134 (*) 135 - 145 mEq/L    Potassium 3.9  3.5 - 5.1 mEq/L   Chloride 100  96 - 112 mEq/L   CO2 22  19 - 32 mEq/L   Glucose, Bld 79  70 - 99 mg/dL   BUN 12  6 - 23 mg/dL   Creatinine, Ser 7.82  0.50 - 1.35 mg/dL   Calcium 8.8  8.4 - 95.6 mg/dL   GFR calc non Af Amer >90  >90 mL/min   GFR calc Af Amer >90  >90 mL/min   Comment: (NOTE)     The eGFR has been calculated using the CKD EPI equation.     This calculation has not been validated in all clinical situations.     eGFR's persistently <90 mL/min signify possible Chronic Kidney     Disease.  TSH     Status: Abnormal   Collection Time    03/23/13  7:30 AM      Result Value Range   TSH 0.105 (*) 0.350 - 4.500 uIU/mL   Comment: Performed at [facility]  RPR     Status: Abnormal   Collection Time    03/23/13  7:30 AM      Result Value Range   RPR Reactive (*) NON REACTIVE   Comment: *CONFIRMATORY TESTING TO FOLLOW*     Performed at [facility]  VITAMIN B12     Status: None   Collection Time    03/23/13  7:30 AM      Result Value Range   Vitamin B-12 408  211 - 911 pg/mL   Comment: Performed at [facility]  FOLATE     Status: None   Collection Time    03/23/13  7:30 AM      Result Value Range   Folate >20.0     Comment: (NOTE)     Reference Ranges            Deficient:       0.4 - 3.3 ng/mL            Indeterminate:   3.4 - 5.4 ng/mL            Normal:              > 5.4 ng/mL     Performed at [facility]  RPR TITER     Status: None   Collection Time    03/23/13  7:30 AM      Result Value Range   RPR Titer 1:1  NON REACTIVE   Comment: Performed at [facility]  GLUCOSE, CAPILLARY     Status: None   Collection Time    03/23/13 10:17 AM      Result Value Range   Glucose-Capillary 76  70 - 99 mg/dL  CSF CELL COUNT WITH DIFFERENTIAL     Status: Abnormal   Collection Time    03/23/13 12:10 PM      Result Value Range   Tube # 3     Color, CSF COLORLESS  COLORLESS   Appearance, CSF CLEAR  CLEAR    Supernatant NOT INDICATED     RBC Count, CSF 13 (*) 0 /cu mm   WBC, CSF 121 (*) 0 - 5 /cu mm   Comment: CRITICAL RESULT CALLED TO, READ BACK BY AND VERIFIED WITH:     MARISSA ELIZONDO,RN AT 1337 03/23/13 BY ZBEECH.   Segmented Neutrophils-CSF 2  0 - 6 %   Lymphs, CSF 83 (*) 40 - 80 %   Monocyte-Macrophage-Spinal Fluid 15  15 - 45 %   Eosinophils, CSF 0  0 - 1 %  GRAM STAIN     Status: None   Collection Time    03/23/13 12:10 PM      Result Value Range   Specimen Description CSF     Special Requests 2.1 ML CSF FLUID     Gram Stain       Value: WBC PRESENT, PREDOMINANTLY MONONUCLEAR     NO ORGANISMS SEEN     CYTOSPIN SLIDE   Report Status 03/23/2013 FINAL    PROTEIN AND GLUCOSE, CSF     Status: Abnormal   Collection Time    03/23/13 12:10 PM      Result Value Range   Glucose, CSF 31 (*) 43 -  76 mg/dL   Total  Protein, CSF 161 (*) 15 - 45 mg/dL  GLUCOSE, CAPILLARY     Status: None   Collection Time    03/23/13 12:42 PM      Result Value Range   Glucose-Capillary 79  70 - 99 mg/dL  GLUCOSE, CAPILLARY     Status: None   Collection Time    03/23/13  4:26 PM      Result Value Range   Glucose-Capillary 71  70 - 99 mg/dL      Component Value Date/Time   SDES CSF 03/23/2013 1210   SPECREQUEST 2.1 ML CSF FLUID 03/23/2013 1210   REPTSTATUS 03/23/2013 FINAL 03/23/2013 1210   Dg Chest 2 View (if Patient Has Fever And/or Copd)  03/22/2013   CLINICAL DATA:  Shortness of breath and altered mental status  EXAM: CHEST  2 VIEW  COMPARISON:  01/23/2010  FINDINGS: Mild cardiomegaly, stable from prior. No change in upper mediastinal contours. Aortic atherosclerosis.  Negative for acute infiltrate, edema, effusion, or pneumothorax. Hyperinflation with mild interstitial coarsening, suggesting chronic lung disease, possibly COPD.  IMPRESSION: No active cardiopulmonary disease.   Electronically Signed   By: Tiburcio Pea   On: 03/22/2013 21:40   Ct Head Wo Contrast  03/23/2013   CLINICAL DATA:   Altered level of consciousness.  Headache.  EXAM: CT HEAD WITHOUT CONTRAST  TECHNIQUE: Contiguous axial images were obtained from the base of the skull through the vertex without intravenous contrast.  COMPARISON:  No priors.  FINDINGS: Patchy areas of mild decreased attenuation throughout the deep and periventricular white matter of the cerebral hemispheres bilaterally, compatible with chronic microvascular ischemic disease. No acute intracranial abnormalities. Specifically, no evidence of acute intracranial hemorrhage, no definite findings of acute/subacute cerebral ischemia, no mass, mass effect, hydrocephalus or abnormal intra or extra-axial fluid collections. Visualized paranasal sinuses and mastoids are well pneumatized, with exception of a small amount of mucosal thickening in the maxillary and ethmoid sinuses bilaterally. No acute displaced skull fractures are identified.  IMPRESSION: 1. No acute intracranial abnormalities. 2. Mild chronic microvascular ischemic changes in the cerebral white matter, as above.   Electronically Signed   By: Trudie Reed M.D.   On: 03/23/2013 01:16   Dg Fluoro Guide Lumbar Puncture  03/23/2013   CLINICAL DATA:  Headache with altered mental status. History of hypertension and diabetes.  EXAM: DIAGNOSTIC LUMBAR PUNCTURE UNDER FLUOROSCOPIC GUIDANCE  FLUOROSCOPY TIME:  15 seconds  PROCEDURE: Time out procedure was performed. Informed consent was obtained from the patient prior to the procedure, including potential complications of headache, allergy, and pain. With the patient prone, the lower back was prepped with Betadine. 1% Lidocaine was used for local anesthesia. Lumbar puncture was performed at the L2-3 level using a 12.5 cm 22 gauge needle with return of blood tinged CSF with an opening pressure of 32 cm water (measured prone through the needle). 9ml of CSF were obtained for laboratory studies. The patient tolerated the procedure well and there were no apparent  complications.  IMPRESSION: Diagnostic lumbar puncture performed without any complications.   Electronically Signed   By: Roxy Horseman   On: 03/23/2013 13:07   Recent Results (from the past 240 hour(s))  GRAM STAIN     Status: None   Collection Time    03/23/13 12:10 PM      Result Value Range Status   Specimen Description CSF   Final   Special Requests 2.1 ML CSF FLUID   Final  Gram Stain     Final   Value: WBC PRESENT, PREDOMINANTLY MONONUCLEAR     NO ORGANISMS SEEN     CYTOSPIN SLIDE   Report Status 03/23/2013 FINAL   Final      03/23/2013, 8:15 PM     LOS: 1 day

## 2013-03-23 NOTE — Progress Notes (Signed)
Pt has a home CPAP at bedside. All wiring looks good. Pt is able to put CPAP on. RT explained to call if there were any complications.

## 2013-03-23 NOTE — Progress Notes (Signed)
ANTIBIOTIC CONSULT NOTE - Follow Up  Pharmacy Consult for Vancomycin/Rocephin/Ampicillin Indication: r/o meningitis  Allergies  Allergen Reactions  . Codeine Nausea And Vomiting  . Demerol Nausea And Vomiting   Patient Measurements: Height: 5\' 10"  (177.8 cm) Weight: 278 lb (126.1 kg) IBW/kg (Calculated) : 73  Vital Signs: Temp: 98.3 F (36.8 C) (09/16 1328) Temp src: Oral (09/16 1328) BP: 152/64 mmHg (09/16 1328) Pulse Rate: 69 (09/16 1328)  Labs:  Recent Labs  03/22/13 2250 03/23/13 0730  WBC 9.6 8.8  HGB 14.2 12.6*  PLT 216 194  CREATININE 0.90 0.67   Estimated Creatinine Clearance: 119.4 ml/min (by C-G formula based on Cr of 0.67).   Microbiology: Recent Results (from the past 720 hour(s))  GRAM STAIN     Status: None   Collection Time    03/23/13 12:10 PM      Result Value Range Status   Specimen Description CSF   Final   Special Requests 2.1 ML CSF FLUID   Final   Gram Stain     Final   Value: WBC PRESENT, PREDOMINANTLY MONONUCLEAR     NO ORGANISMS SEEN     CYTOSPIN SLIDE   Report Status 03/23/2013 FINAL   Final   Medical History: Past Medical History  Diagnosis Date  . Hypertension   . Diabetes mellitus   . Thyroid disease   . Diabetes mellitus     Type 2  . Obstructive sleep apnea of adult   . Gallstones and inflammation of gallbladder without obstruction   . Cholecystitis with cholelithiasis   . Arthritis   . Emphysema of lung   . Hyperlipidemia   . Eczema   . Umbilical hernia   . Tobacco abuse     quit 11/08/2012  . Obesity   . History of nuclear stress test 02/23/2010    dipyridamole; normal pattern of perfusion in all regions; normal, low risk study    Medications:  Prescriptions prior to admission  Medication Sig Dispense Refill  . amLODipine (NORVASC) 10 MG tablet Take 10 mg by mouth daily.      [name] amoxicillin-clavulanate (AUGMENTIN) 875-125 MG per tablet Take 1 tablet by mouth 2 (two) times daily. For 10 days start 9.11.14 end  9.21.14      . aspirin 81 MG tablet Take 81 mg by mouth daily.      . benazepril (LOTENSIN) 40 MG tablet Take 40 mg by mouth 2 (two) times daily.      . fexofenadine (ALLEGRA) 180 MG tablet Take 180 mg by mouth daily.      [name] glyBURIDE-metformin (GLUCOVANCE) 5-500 MG per tablet Take 1 tablet by mouth 3 (three) times daily.       [name] levothyroxine (SYNTHROID, LEVOTHROID) 300 MCG tablet Take 300 mcg by mouth daily before breakfast.      . metoprolol succinate (TOPROL-XL) 100 MG 24 hr tablet Take 100 mg by mouth daily.       . pravastatin (PRAVACHOL) 40 MG tablet Take 40 mg by mouth daily.      [name] spironolactone (ALDACTONE) 25 MG tablet Take 25 mg by mouth daily.       Scheduled:  . amLODipine  10 mg Oral Daily  . ampicillin (OMNIPEN) IV  2 g Intravenous Q4H  . benazepril  40 mg Oral BID  . cefTRIAXone (ROCEPHIN)  IV  2 g Intravenous Q12H  . enoxaparin (LOVENOX) injection  40 mg Subcutaneous Q24H  . glyBURIDE  5 mg Oral TID AC  . insulin  aspart  0-15 Units Subcutaneous Q6H  . levothyroxine  300 mcg Oral QAC breakfast  . metFORMIN  500 mg Oral TID AC  . metoprolol succinate  100 mg Oral Daily  . vancomycin  1,000 mg Intravenous Q8H   Anti-infectives   Start     Dose/Rate Route Frequency Ordered Stop   03/23/13 1400  vancomycin (VANCOCIN) IVPB 1000 mg/200 mL premix     1,000 mg 200 mL/hr over 60 Minutes Intravenous Every 8 hours 03/23/13 0648     03/23/13 1400  ampicillin (OMNIPEN) 2 g in sodium chloride 0.9 % 50 mL IVPB     2 g 150 mL/hr over 20 Minutes Intravenous 6 times per day 03/23/13 1347     03/23/13 1000  cefTRIAXone (ROCEPHIN) 2 g in dextrose 5 % 50 mL IVPB     2 g 100 mL/hr over 30 Minutes Intravenous Every 12 hours 03/23/13 0630     03/23/13 0700  vancomycin (VANCOCIN) 2,500 mg in sodium chloride 0.9 % 500 mL IVPB     2,500 mg 250 mL/hr over 120 Minutes Intravenous  Once 03/23/13 0648 03/23/13 0932     Assessment: 67yo male had recent Dx of sinusitis and c/o worsening Sx  despite amox and Allegra, c/o continued HA w/ fevers and chills, has uncoordinated gait and now confused, concern for meningitis.  He was started on IV Vancomycin and Rocephin and now to be placed on IV Ampicillin as well.  He has no allergies to penicillin, his renal function is normal with an estimated clearance of ~ 120 ml/min.  Goal of Therapy:  Vancomycin trough level 15-20 mcg/ml Therapeutic response to IV antibiotics  Plan:  1.  Continue IV Vancomycin 1000mg  IV Q8H  2.  Continue IV Rocephin 2 gm q12H 3.  Start IV Ampicillin 2gm every 4 H 4.  Monitor CBC, Cx, levels prn.  Nadara Mustard, PharmD., MS Clinical Pharmacist Pager:  [phone] Thank you for allowing pharmacy to be part of this patients care team. 03/23/2013,2:22 PM

## 2013-03-23 NOTE — ED Notes (Signed)
Patient transported to CT by Raiford Noble

## 2013-03-23 NOTE — Progress Notes (Signed)
[facility] PROGRESS NOTE  DOVER HEAD ZOX:[id number] DOB: 08/20/1945 DOA: 03/22/2013 PCP: [name], MD  Assessment/Plan: Active Problems:   Hypertension   Thyroid disease   Diabetes mellitus   Dyslipidemia   Encephalopathy acute   Fever   OSA on CPAP   Obesity    1. Acute encephalopathy/Fever: Patient presented with one week of headache, and was on Augumentin from 03/18/13, prescribed by PMD, for possible sinus infection. He became pyrexial/confused on 03/22/13, with temperature as high as 102, associated with nausea, dizziness and ataxia. Wcc was normal. CXR was negative for acute disease, head CT scan showed no acute findings, and urinalysis was negative. The clincal picture is suspicious for acute meningitis/encephalitis. Now in iv Vancomycin/Rocephin/Ampicillin, day #1. LP revealed a pleocytosis of 121, with 2 neutrophils and 83 lymphocytes, Tot Pr 106, Gluc 31. Blood cultures are pending. Otherwise, supportive treatment. RPR is reactive 1:1, Vitamin B12, folate levels, ordered. Have consulted Dr Reggie Pile, ID.  2. DM/Episode of hypoglycemia:  Patient is a type-2 diabetic, hitherto, on oral hypoglycemics. CBG at home, was in the low 100s, and in the ER, was 50. Administered 1 amp D50W, with improvement in CBG to 75-76. Will manage with ivi D5NS. Oral hypoglycemics are on hold for now. 3. Hypertension: BP is reasonable at this time.  4. Obstructive sleep apnea/morbid obesity: Stable. On CPAP. 5. Hypothyroidism: Continued on thyroxine replacement therapy. TSH is pending.    Code Status: Full Code. Family Communication:  Disposition Plan: To be determined.    Brief narrative: This is a 67 year old gentleman with history of Obesity, HTN, DM-2, Dyslipidemia, Hypothyroidism, COPD/Emphesema, OA s/p intra-articular steroid injection of knee about 3-4 weeks ago, OSA, who became acutely confused on 03/23/13. For approximately a week, patient has had a headache. He saw his  PCP and was treated for sinus infection with Amoxicillin and Allegra. His headaches have continued, he's had fevers and chills and continues to feel generally bad. On 03/22/13, he became acutely confused and was taken to urgent care. His temperature was found to be 102, and he was directed to [facility] . He's also had nausea, but no vomiting, and has been dizzy. On 03/22/13, he had trouble walking in a coordinated manner. When asked for his insurance card, he was looking for drivers license. The ER physician attempted to do a lumbar puncture, but was unsuccessful. At home patient's fingerstick blood sugar was in the low 100s, and in the ER, was 50. His received an amp of D50. Fingerstick blood sugars normalized, patient continues to be confused and febrile. Admitted for further evaluation and management.    Consultants:  N/A.  Procedures:  Head CT scan.  CXR.   Antibiotics:  Vancomycin 03/23/13>>>  Rocephin 9/176/14>>>  HPI/Subjective: Disoriented.  Objective: Vital signs in last 24 hours: Temp:  [98.4 F (36.9 C)-102.7 F (39.3 C)] 98.4 F (36.9 C) (09/16 0940) Pulse Rate:  [63-83] 74 (09/16 0940) Resp:  [14-40] 24 (09/16 0940) BP: (134-197)/(56-89) 155/73 mmHg (09/16 0940) SpO2:  [93 %-100 %] 95 % (09/16 0940) Weight:  [126.1 kg (278 lb)] 126.1 kg (278 lb) (09/16 0532) Weight change:  Last BM Date: 03/19/13  Intake/Output from previous day: 09/15 0701 - 09/16 0700 In: -  Out: 350 [Urine:350] Total I/O In: -  Out: 150 [Urine:150]   Physical Exam: General: Comfortable, alert, communicative, dysoriented, not short of breath at rest. Not photophobic. Neck is mildly stiff.  HEENT:  No clinical pallor, no jaundice, no conjunctival injection or  discharge. NECK:  Supple, JVP not seen, no carotid bruits, no palpable lymphadenopathy, no palpable goiter. CHEST:  Clinically clear to auscultation, no wheezes, no crackles. HEART:  Sounds 1 and 2 heard, normal, regular, no  murmurs. ABDOMEN:  Obese, non-tender, no palpable organomegaly, no palpable masses, normal bowel sounds. GENITALIA:  Not examined. LOWER EXTREMITIES:  No pitting edema, palpable peripheral pulses. MUSCULOSKELETAL SYSTEM:  Generalized osteoarthritic changes, otherwise, normal. CENTRAL NERVOUS SYSTEM:  No focal neurologic deficit on gross examination.  Lab Results:  Recent Labs  03/22/13 2250 03/23/13 0730  WBC 9.6 8.8  HGB 14.2 12.6*  HCT 39.5 36.0*  PLT 216 194    Recent Labs  03/22/13 2250 03/23/13 0730  NA 136 134*  K 4.0 3.9  CL 100 100  CO2 26 22  GLUCOSE 54* 79  BUN 15 12  CREATININE 0.90 0.67  CALCIUM 10.1 8.8   No results found for this or any previous visit (from the past 240 hour(s)).   Studies/Results: Dg Chest 2 View (if Patient Has Fever And/or Copd)  03/22/2013   CLINICAL DATA:  Shortness of breath and altered mental status  EXAM: CHEST  2 VIEW  COMPARISON:  01/23/2010  FINDINGS: Mild cardiomegaly, stable from prior. No change in upper mediastinal contours. Aortic atherosclerosis.  Negative for acute infiltrate, edema, effusion, or pneumothorax. Hyperinflation with mild interstitial coarsening, suggesting chronic lung disease, possibly COPD.  IMPRESSION: No active cardiopulmonary disease.   Electronically Signed   By: Tiburcio Pea   On: 03/22/2013 21:40   Ct Head Wo Contrast  03/23/2013   CLINICAL DATA:  Altered level of consciousness.  Headache.  EXAM: CT HEAD WITHOUT CONTRAST  TECHNIQUE: Contiguous axial images were obtained from the base of the skull through the vertex without intravenous contrast.  COMPARISON:  No priors.  FINDINGS: Patchy areas of mild decreased attenuation throughout the deep and periventricular white matter of the cerebral hemispheres bilaterally, compatible with chronic microvascular ischemic disease. No acute intracranial abnormalities. Specifically, no evidence of acute intracranial hemorrhage, no definite findings of acute/subacute  cerebral ischemia, no mass, mass effect, hydrocephalus or abnormal intra or extra-axial fluid collections. Visualized paranasal sinuses and mastoids are well pneumatized, with exception of a small amount of mucosal thickening in the maxillary and ethmoid sinuses bilaterally. No acute displaced skull fractures are identified.  IMPRESSION: 1. No acute intracranial abnormalities. 2. Mild chronic microvascular ischemic changes in the cerebral white matter, as above.   Electronically Signed   By: Trudie Reed M.D.   On: 03/23/2013 01:16    Medications: Scheduled Meds: . amLODipine  10 mg Oral Daily  . benazepril  40 mg Oral BID  . cefTRIAXone (ROCEPHIN)  IV  2 g Intravenous Q12H  . enoxaparin (LOVENOX) injection  40 mg Subcutaneous Q24H  . glyBURIDE  5 mg Oral TID AC  . insulin aspart  0-15 Units Subcutaneous Q6H  . levothyroxine  300 mcg Oral QAC breakfast  . metFORMIN  500 mg Oral TID AC  . metoprolol succinate  100 mg Oral Daily  . vancomycin  1,000 mg Intravenous Q8H   Continuous Infusions: . 0.9 % NaCl with KCl 20 mEq / L 75 mL/hr at 03/23/13 0731   PRN Meds:.acetaminophen, acetaminophen, alum & mag hydroxide-simeth, ondansetron (ZOFRAN) IV, ondansetron, senna-docusate    LOS: 1 day   Takeysha Bonk,CHRISTOPHER  [facility] Pager [phone]. If 8PM-8AM, please contact night-coverage at [email or web address], password [facility] 03/23/2013, 11:11 AM  LOS: 1 day

## 2013-03-23 NOTE — Progress Notes (Signed)
Inpatient Diabetes Program Recommendations  AACE/ADA: New Consensus Statement on Inpatient Glycemic Control (2013)  Target Ranges:  Prepandial:   less than 140 mg/dL      Peak postprandial:   less than 180 mg/dL (1-2 hours)      Critically ill patients:  140 - 180 mg/dL     Results for RAUN, ROUTH (MRN [id number]) as of 03/23/2013 14:10  Ref. Range 03/22/2013 22:59 03/23/2013 00:06 03/23/2013 10:17 03/23/2013 12:42  Glucose-Capillary Latest Range: 70-99 mg/dL 45 (L) 75 76 79    Inpatient Diabetes Program Recommendations Oral Agents: MD- Noted patient hypoglycemic on admission.  If patient has any further issues with hypoglycemia, please d/c Glyburide and Metformin from inpatient regimen until closer to d/c.    Will follow. Ambrose Finland RN, MSN, CDE Diabetes Coordinator Inpatient Diabetes Program Team Pager: [phone] (8a-10p)

## 2013-03-23 NOTE — Progress Notes (Signed)
Charge RN received critical value call from lab. Pt CSF WBC was 121. MD notified. No orders given.

## 2013-03-23 NOTE — H&P (Signed)
PCP:   [name], MD   Chief Complaint:  Altered mental status  HPI: This is a 67 year old gentleman who became acutely confused today. For approximately a week the patient complains of headache. Hesaw his PCP and was treated for sinus infection with amoxicillin and Allegra. His headaches have continued, he's had feversand chills and continues to feel generally bad. Today he became acutely confused and was taken to urgent care. His temperature was 102, he was directed to [facility]. He's also had nausea but no vomiting, he has been dizzy. Today he had trouble walking in a coordinated manner. When asked for his insurance card he was looking for drivers license. During my interview the patient was able to provide some history but is still confused. History provided by the wife and his son who are at the bedside. The ER physician attempted to do a lumbar puncture but was unsuccessful.   At home patient's fingerstick blood sugar was checked it was in the low 100s, here the ER his blood sugar was 50. His received an amp of D50. Fingerstick blood sugars have normalized, patient continues to be confused and febrile.   Review of Systems:  The patient denies anorexia, weight loss,, vision loss, decreased hearing, hoarseness, chest pain, syncope, dyspnea on exertion, peripheral edema, balance deficits, hemoptysis, abdominal pain, melena, hematochezia, severe indigestion/heartburn, hematuria, incontinence, genital sores, muscle weakness, suspicious skin lesions, transient blindness, depression, unusual weight change, abnormal bleeding, enlarged lymph nodes, angioedema, and breast masses.  Past Medical History: Past Medical History  Diagnosis Date  . Hypertension   . Diabetes mellitus   . Thyroid disease   . Diabetes mellitus     Type 2  . Obstructive sleep apnea of adult   . Gallstones and inflammation of gallbladder without obstruction   . Cholecystitis with cholelithiasis   . Arthritis   .  Emphysema of lung   . Hyperlipidemia   . Eczema   . Umbilical hernia   . Tobacco abuse     quit 11/08/2012  . Obesity   . History of nuclear stress test 02/23/2010    dipyridamole; normal pattern of perfusion in all regions; normal, low risk study    Past Surgical History  Procedure Laterality Date  . Inguinal hernia repair    . Knee arthroscopy    . Cholecystectomy  09/09/2011    Procedure: LAPAROSCOPIC CHOLECYSTECTOMY WITH INTRAOPERATIVE CHOLANGIOGRAM;  Surgeon: Cherylynn Ridges III, MD;  Location: [facility] OR;  Service: General;  Laterality: N/A;  . Transthoracic echocardiogram  03/28/2005    EF=>55%, mod conc LVH; LA mod dilated & borderline RA enlargement; mild TR; mild pulm valve regurg    Medications: Prior to Admission medications   Medication Sig Start Date End Date Taking? Authorizing Provider  amLODipine (NORVASC) 10 MG tablet Take 10 mg by mouth daily.   Yes Historical Provider, MD  amoxicillin-clavulanate (AUGMENTIN) 875-125 MG per tablet Take 1 tablet by mouth 2 (two) times daily. For 10 days start 9.11.14 end 9.21.14   Yes Historical Provider, MD  aspirin 81 MG tablet Take 81 mg by mouth daily.   Yes Historical Provider, MD  benazepril (LOTENSIN) 40 MG tablet Take 40 mg by mouth 2 (two) times daily.   Yes Historical Provider, MD  fexofenadine (ALLEGRA) 180 MG tablet Take 180 mg by mouth daily.   Yes Historical Provider, MD  glyBURIDE-metformin (GLUCOVANCE) 5-500 MG per tablet Take 1 tablet by mouth 3 (three) times daily.    Yes Historical Provider, MD  levothyroxine (SYNTHROID, LEVOTHROID) 300 MCG tablet Take 300 mcg by mouth daily before breakfast.   Yes Historical Provider, MD  metoprolol succinate (TOPROL-XL) 100 MG 24 hr tablet Take 100 mg by mouth daily.  02/06/13  Yes Historical Provider, MD  pravastatin (PRAVACHOL) 40 MG tablet Take 40 mg by mouth daily.   Yes Historical Provider, MD  spironolactone (ALDACTONE) 25 MG tablet Take 25 mg by mouth daily.   Yes Historical Provider, MD     Allergies:   Allergies  Allergen Reactions  . Codeine Nausea And Vomiting  . Demerol Nausea And Vomiting    Social History:  reports that he quit smoking about 4 months ago. His smoking use included Cigarettes. He has a 25 pack-year smoking history. He has never used smokeless tobacco. He reports that he does not drink alcohol or use illicit drugs.  Family History: Family History  Problem Relation Age of Onset  . Cancer Maternal Aunt     colon  . Cancer Maternal Aunt     lung  . Thyroid disease Mother   . Hyperlipidemia Father   . Diabetes Maternal Grandmother   . Asthma Child   . Diabetes type I Child     Physical Exam: Filed Vitals:   03/23/13 0030 03/23/13 0158 03/23/13 0200 03/23/13 0330  BP: 158/59  146/73 143/89  Pulse: 70  75 65  Temp:      TempSrc:      Resp:    29  SpO2: 93% 100% 93% 93%    General:  Alert and oriented times three but confused,  obese, no acute distress Eyes: PERRLA, pink conjunctiva, no scleral icterus ENT: Moist oral mucosa, neck supple, no thyromegaly, no meningismus  Lungs: clear to ascultation, no wheeze, no crackles, no use of accessory muscles Cardiovascular: regular rate and rhythm, no regurgitation, no gallops, no murmurs. No carotid bruits, no JVD Abdomen: soft, positive BS, non-tender, non-distended, no organomegaly, not an acute abdomen GU: not examined Neuro: CN II - XII grossly intact, sensation intact Musculoskeletal: strength 5/5 all extremities, no clubbing, cyanosis or edema Skin: no rash, no subcutaneous crepitation, no decubitus    Labs on Admission:   Recent Labs  03/22/13 2250  NA 136  K 4.0  CL 100  CO2 26  GLUCOSE 54*  BUN 15  CREATININE 0.90  CALCIUM 10.1    Recent Labs  03/22/13 2250  AST 16  ALT 18  ALKPHOS 65  BILITOT 0.9  PROT 7.9  ALBUMIN 4.4   No results found for this basename: LIPASE, AMYLASE,  in the last 72 hours  Recent Labs  03/22/13 2250  WBC 9.6  HGB 14.2  HCT 39.5   MCV 85.5  PLT 216    Micro Results: Results for Mason Patton, Mason Patton (MRN [id number]) as of 03/23/2013 04:45  Ref. Range 03/22/2013 23:04  Color, Urine Latest Range: YELLOW  YELLOW  APPearance Latest Range: CLEAR  CLEAR  Specific Gravity, Urine Latest Range: 1.005-1.030  1.023  pH Latest Range: 5.0-8.0  5.5  Glucose Latest Range: NEGATIVE mg/dL NEGATIVE  Bilirubin Urine Latest Range: NEGATIVE  SMALL (A)  Ketones, ur Latest Range: NEGATIVE mg/dL NEGATIVE  Protein Latest Range: NEGATIVE mg/dL 30 (A)  Urobilinogen, UA Latest Range: 0.0-1.0 mg/dL 2.0 (H)  Nitrite Latest Range: NEGATIVE  NEGATIVE  Leukocytes, UA Latest Range: NEGATIVE  TRACE (A)  Hgb urine dipstick Latest Range: NEGATIVE  NEGATIVE  Urine-Other No range found MUCOUS PRESENT  WBC, UA Latest Range: <3 WBC/hpf 3-6  RBC / HPF Latest Range: <3 RBC/hpf 0-2  Squamous Epithelial / LPF Latest Range: RARE  RARE  Bacteria, UA Latest Range: RARE  RARE  Crystals Latest Range: NEGATIVE  CA OXALATE CRYSTALS (A)     Radiological Exams on Admission: Dg Chest 2 View (if Patient Has Fever And/or Copd)  03/22/2013   CLINICAL DATA:  Shortness of breath and altered mental status  EXAM: CHEST  2 VIEW  COMPARISON:  01/23/2010  FINDINGS: Mild cardiomegaly, stable from prior. No change in upper mediastinal contours. Aortic atherosclerosis.  Negative for acute infiltrate, edema, effusion, or pneumothorax. Hyperinflation with mild interstitial coarsening, suggesting chronic lung disease, possibly COPD.  IMPRESSION: No active cardiopulmonary disease.   Electronically Signed   By: Tiburcio Pea   On: 03/22/2013 21:40   Ct Head Wo Contrast  03/23/2013   CLINICAL DATA:  Altered level of consciousness.  Headache.  EXAM: CT HEAD WITHOUT CONTRAST  TECHNIQUE: Contiguous axial images were obtained from the base of the skull through the vertex without intravenous contrast.  COMPARISON:  No priors.  FINDINGS: Patchy areas of mild decreased attenuation throughout  the deep and periventricular white matter of the cerebral hemispheres bilaterally, compatible with chronic microvascular ischemic disease. No acute intracranial abnormalities. Specifically, no evidence of acute intracranial hemorrhage, no definite findings of acute/subacute cerebral ischemia, no mass, mass effect, hydrocephalus or abnormal intra or extra-axial fluid collections. Visualized paranasal sinuses and mastoids are well pneumatized, with exception of a small amount of mucosal thickening in the maxillary and ethmoid sinuses bilaterally. No acute displaced skull fractures are identified.  IMPRESSION: 1. No acute intracranial abnormalities. 2. Mild chronic microvascular ischemic changes in the cerebral white matter, as above.   Electronically Signed   By: Trudie Reed M.D.   On: 03/23/2013 01:16    Assessment/Plan Present on Admission:  Acute encephalopathy in the setting of fever Rule out meningitis. Admit to MedSurg. Consult specialist lumbar puncture. Start Rocephin and vancomycin RPR,  Vitamin B12, folate levels, blood cultures and urine cultures ordered Episode of hypoglycemia/. Diabetes mellitus Resolved  Patient n.p.o. until an LP performed, sliding scale insulin every 6 hours  Hypertension . Dyslipidemia Obstructive sleep apnea/morbid obesity Home medications resumed, home CPAP [family request]  . Thyroid disease Check a TSH, Synthroid of 300 mcg continued  Full code DVT prophylaxis  Mason Patton 03/23/2013, 4:30 AM

## 2013-03-23 NOTE — Progress Notes (Signed)
Hypoglycemic Event  CBG: 56  Treatment: 15 GM carbohydrate snack and 15 GM gel Orange juice, oral dextrose gel   Symptoms: Sweaty and Shaky   Follow-up CBG: Time:1015 CBG Result:71  Possible Reasons for Event: Inadequate meal intake   Comments/MD notified NP notified at this time. Hypoglycemic order set followed.     Messer, Cassandra Mcmanaman A  Remember to initiate Hypoglycemia Order Set & complete

## 2013-03-23 NOTE — Procedures (Signed)
Lumbar puncture performed at L2/3 on left.  CSF blood-tinged.  Details dictated in radiology report.

## 2013-03-24 DIAGNOSIS — G009 Bacterial meningitis, unspecified: Principal | ICD-10-CM

## 2013-03-24 DIAGNOSIS — E119 Type 2 diabetes mellitus without complications: Secondary | ICD-10-CM

## 2013-03-24 DIAGNOSIS — G4733 Obstructive sleep apnea (adult) (pediatric): Secondary | ICD-10-CM

## 2013-03-24 DIAGNOSIS — E079 Disorder of thyroid, unspecified: Secondary | ICD-10-CM

## 2013-03-24 DIAGNOSIS — E785 Hyperlipidemia, unspecified: Secondary | ICD-10-CM

## 2013-03-24 LAB — GLUCOSE, CAPILLARY
Glucose-Capillary: 46 mg/dL — ABNORMAL LOW (ref 70–99)
Glucose-Capillary: 56 mg/dL — ABNORMAL LOW (ref 70–99)
Glucose-Capillary: 115 mg/dL — ABNORMAL HIGH (ref 70–99)
Glucose-Capillary: 158 mg/dL — ABNORMAL HIGH (ref 70–99)

## 2013-03-24 LAB — BASIC METABOLIC PANEL
BUN: 7 mg/dL (ref 6–23)
Chloride: 97 mEq/L (ref 96–112)
GFR calc Af Amer: >90 mL/min (ref >90)
Potassium: 3.9 mEq/L (ref 3.5–5.1)

## 2013-03-24 LAB — CBC
HCT: 38.5 % — ABNORMAL LOW (ref 39.0–52.0)
RDW: 12.4 % (ref 11.5–15.5)
WBC: 10.2 10*3/uL (ref 4.0–10.5)

## 2013-03-24 LAB — HERPES SIMPLEX VIRUS(HSV) DNA BY PCR

## 2013-03-24 LAB — T.PALLIDUM AB, IGG: T pallidum Antibodies (TP-PA): >8 S/CO — ABNORMAL HIGH (ref <0.90)

## 2013-03-24 LAB — URINE CULTURE

## 2013-03-24 LAB — HIV ANTIBODY (ROUTINE TESTING W REFLEX): HIV: NONREACTIVE

## 2013-03-24 MED ORDER — LEVOTHYROXINE SODIUM 125 MCG PO TABS
250.0000 ug | ORAL_TABLET | Freq: Every day | ORAL | Status: DC
  Administered 2013-03-25 – 2013-03-30 (×6): 250 ug via ORAL
  Filled 2013-03-24 (×9): qty 2

## 2013-03-24 MED ORDER — INFLUENZA VAC SPLIT QUAD 0.5 ML IM SUSP
0.5000 mL | INTRAMUSCULAR | Status: AC
  Administered 2013-03-25: 0.5 mL via INTRAMUSCULAR
  Filled 2013-03-24: qty 0.5

## 2013-03-24 NOTE — Progress Notes (Signed)
INFECTIOUS DISEASE PROGRESS NOTE  ID: Mason Patton is a 67 y.o. male with  Active Problems:   Hypertension   Thyroid disease   Diabetes mellitus   Dyslipidemia   Encephalopathy acute   Fever   OSA on CPAP   Obesity  Subjective: awakens easily  Abtx:  Anti-infectives   Start     Dose/Rate Route Frequency Ordered Stop   03/23/13 2200  acyclovir (ZOVIRAX) 940 mg in dextrose 5 % 150 mL IVPB     10 mg/kg  94.2 kg (Adjusted) 168.8 mL/hr over 60 Minutes Intravenous 3 times per day 03/23/13 2048     03/23/13 1400  vancomycin (VANCOCIN) IVPB 1000 mg/200 mL premix     1,000 mg 200 mL/hr over 60 Minutes Intravenous Every 8 hours 03/23/13 0648     03/23/13 1400  ampicillin (OMNIPEN) 2 g in sodium chloride 0.9 % 50 mL IVPB     2 g 150 mL/hr over 20 Minutes Intravenous 6 times per day 03/23/13 1347     03/23/13 1000  cefTRIAXone (ROCEPHIN) 2 g in dextrose 5 % 50 mL IVPB     2 g 100 mL/hr over 30 Minutes Intravenous Every 12 hours 03/23/13 0630     03/23/13 0700  vancomycin (VANCOCIN) 2,500 mg in sodium chloride 0.9 % 500 mL IVPB     2,500 mg 250 mL/hr over 120 Minutes Intravenous  Once 03/23/13 0648 03/23/13 0932      Medications:  Scheduled: . acyclovir  10 mg/kg (Adjusted) Intravenous Q8H  . amLODipine  10 mg Oral Daily  . ampicillin (OMNIPEN) IV  2 g Intravenous Q4H  . benazepril  40 mg Oral BID  . cefTRIAXone (ROCEPHIN)  IV  2 g Intravenous Q12H  . dexamethasone  10 mg Intravenous Q6H  . enoxaparin (LOVENOX) injection  40 mg Subcutaneous Q24H  . [START ON 03/25/2013] influenza vac split quadrivalent PF  0.5 mL Intramuscular Tomorrow-1000  . insulin aspart  0-15 Units Subcutaneous Q6H  . [START ON 03/25/2013] levothyroxine  250 mcg Oral QAC breakfast  . metoprolol succinate  100 mg Oral Daily  . vancomycin  1,000 mg Intravenous Q8H    Objective: Vital signs in last 24 hours: Temp:  [98.8 F (37.1 C)-99.2 F (37.3 C)] 98.8 F (37.1 C) (09/17 1009) Pulse Rate:   [68-85] 81 (09/17 1009) Resp:  [20] 20 (09/17 1009) BP: (148-160)/(70-74) 148/73 mmHg (09/17 1009) SpO2:  [95 %-96 %] 96 % (09/17 1009)   General appearance: no distress Resp: clear to auscultation bilaterally Cardio: regular rate and rhythm GI: normal findings: bowel sounds normal and soft, non-tender  Lab Results  Recent Labs  03/23/13 0730 03/24/13 0659  WBC 8.8 10.2  HGB 12.6* 13.8  HCT 36.0* 38.5*  NA 134* 132*  K 3.9 3.9  CL 100 97  CO2 22 23  BUN 12 7  CREATININE 0.67 0.63   Liver Panel  Recent Labs  03/22/13 2250  PROT 7.9  ALBUMIN 4.4  AST 16  ALT 18  ALKPHOS 65  BILITOT 0.9   Sedimentation Rate No results found for this basename: ESRSEDRATE,  in the last 72 hours C-Reactive Protein No results found for this basename: CRP,  in the last 72 hours  Microbiology: Recent Results (from the past 240 hour(s))  URINE CULTURE     Status: None   Collection Time    03/22/13 11:05 PM      Result Value Range Status   Specimen Description URINE, CLEAN CATCH  Final   Special Requests Normal   Final   Culture  Setup Time     Final   Value: 03/23/2013 00:00     Performed at [facility] Count     Final   Value: NO GROWTH     Performed at [facility]   Culture     Final   Value: NO GROWTH     Performed at [facility]   Report Status 03/24/2013 FINAL   Final  CULTURE, BLOOD (ROUTINE X 2)     Status: None   Collection Time    03/22/13 11:50 PM      Result Value Range Status   Specimen Description BLOOD RIGHT ANTECUBITAL   Final   Special Requests BOTTLES DRAWN AEROBIC ONLY 5CC   Final   Culture  Setup Time     Final   Value: 03/23/2013 04:47     Performed at [facility]   Culture     Final   Value:        BLOOD CULTURE RECEIVED NO GROWTH TO DATE CULTURE WILL BE HELD FOR 5 DAYS BEFORE ISSUING A FINAL NEGATIVE REPORT     Performed at [facility]   Report Status PENDING   Incomplete  CULTURE, BLOOD  (ROUTINE X 2)     Status: None   Collection Time    03/23/13 12:13 AM      Result Value Range Status   Specimen Description BLOOD RIGHT HAND   Final   Special Requests BOTTLES DRAWN AEROBIC ONLY 5CC   Final   Culture  Setup Time     Final   Value: 03/23/2013 04:48     Performed at [facility]   Culture     Final   Value:        BLOOD CULTURE RECEIVED NO GROWTH TO DATE CULTURE WILL BE HELD FOR 5 DAYS BEFORE ISSUING A FINAL NEGATIVE REPORT     Performed at [facility]   Report Status PENDING   Incomplete  CSF CULTURE     Status: None   Collection Time    03/23/13 12:10 PM      Result Value Range Status   Specimen Description CSF   Final   Special Requests 2.1ML CSF FLUID   Final   Gram Stain     Final   Value: WBC PRESENT, PREDOMINANTLY PMN     NO ORGANISMS SEEN     Performed at [facility]     Performed at [facility]   Culture     Final   Value: NO GROWTH     Performed at [facility]   Report Status PENDING   Incomplete  GRAM STAIN     Status: None   Collection Time    03/23/13 12:10 PM      Result Value Range Status   Specimen Description CSF   Final   Special Requests 2.1 ML CSF FLUID   Final   Gram Stain     Final   Value: WBC PRESENT, PREDOMINANTLY MONONUCLEAR     NO ORGANISMS SEEN     CYTOSPIN SLIDE   Report Status 03/23/2013 FINAL   Final    Studies/Results: Dg Chest 2 View (if Patient Has Fever And/or Copd)  03/22/2013   CLINICAL DATA:  Shortness of breath and altered mental status  EXAM: CHEST  2 VIEW  COMPARISON:  01/23/2010  FINDINGS: Mild cardiomegaly, stable  from prior. No change in upper mediastinal contours. Aortic atherosclerosis.  Negative for acute infiltrate, edema, effusion, or pneumothorax. Hyperinflation with mild interstitial coarsening, suggesting chronic lung disease, possibly COPD.  IMPRESSION: No active cardiopulmonary disease.   Electronically Signed   By: Tiburcio Pea   On: 03/22/2013 21:40   Ct  Head Wo Contrast  03/23/2013   CLINICAL DATA:  Altered level of consciousness.  Headache.  EXAM: CT HEAD WITHOUT CONTRAST  TECHNIQUE: Contiguous axial images were obtained from the base of the skull through the vertex without intravenous contrast.  COMPARISON:  No priors.  FINDINGS: Patchy areas of mild decreased attenuation throughout the deep and periventricular white matter of the cerebral hemispheres bilaterally, compatible with chronic microvascular ischemic disease. No acute intracranial abnormalities. Specifically, no evidence of acute intracranial hemorrhage, no definite findings of acute/subacute cerebral ischemia, no mass, mass effect, hydrocephalus or abnormal intra or extra-axial fluid collections. Visualized paranasal sinuses and mastoids are well pneumatized, with exception of a small amount of mucosal thickening in the maxillary and ethmoid sinuses bilaterally. No acute displaced skull fractures are identified.  IMPRESSION: 1. No acute intracranial abnormalities. 2. Mild chronic microvascular ischemic changes in the cerebral white matter, as above.   Electronically Signed   By: Trudie Reed M.D.   On: 03/23/2013 01:16   Dg Fluoro Guide Lumbar Puncture  03/23/2013   CLINICAL DATA:  Headache with altered mental status. History of hypertension and diabetes.  EXAM: DIAGNOSTIC LUMBAR PUNCTURE UNDER FLUOROSCOPIC GUIDANCE  FLUOROSCOPY TIME:  15 seconds  PROCEDURE: Time out procedure was performed. Informed consent was obtained from the patient prior to the procedure, including potential complications of headache, allergy, and pain. With the patient prone, the lower back was prepped with Betadine. 1% Lidocaine was used for local anesthesia. Lumbar puncture was performed at the L2-3 level using a 12.5 cm 22 gauge needle with return of blood tinged CSF with an opening pressure of 32 cm water (measured prone through the needle). 9ml of CSF were obtained for laboratory studies. The patient tolerated the  procedure well and there were no apparent complications.  IMPRESSION: Diagnostic lumbar puncture performed without any complications.   Electronically Signed   By: Roxy Horseman   On: 03/23/2013 13:07     Assessment/Plan: Mental Status Change  Meningitis (? Partially treated)  Total days of antibiotics: 2 (vanco/ceftriaxone/amp/ACV/steroids) Arbovirus, HSV, Enterovirus PCRs pending.  RMSF and Ehrlichia pending Would continue anbx, antivirals Explained to family.         Johny Sax Infectious Diseases (pager) [phone] [email or web address] 03/24/2013, 1:28 PM  LOS: 2 days

## 2013-03-24 NOTE — Progress Notes (Addendum)
[facility] PROGRESS NOTE  Mason Patton:[id number] DOB: [birth year] DOA: 03/22/2013 PCP: [name], MD  Assessment/Plan: 1. Acute encephalopathy/Meningitis: Patient presented with one week of headache, and was on Augumentin from 03/18/13, prescribed by PMD, for possible sinus infection. He became pyrexial/confused on 03/22/13, with temperature as high as 102, associated with nausea, dizziness and ataxia. Wcc was normal. CXR was negative for acute disease, head CT scan showed no acute findings, and urinalysis was negative. The clincal picture is suspicious for acute meningitis/encephalitis. Now in iv Vancomycin/Rocephin/Ampicillin, day #1. LP revealed a pleocytosis of 121, with 2 neutrophils and 83 lymphocytes, Tot Pr 106, Gluc 31. Blood cultures are pending. Otherwise, supportive treatment. RPR is reactive 1:1, Vitamin B12, folate levels, ordered. Have consulted Dr Reggie Pile, ID who has started patient on IV acyclovir and sent for more viral cultures which are pending at this time.  Continue current therapy. Follow neuro status.   2. DM/Persistent hypoglycemia: Patient is a type-2 diabetic and had been on glyburide outpatient.  He is not eating well. Continue D5NS, frequent CBC checks, DC'd oral hypoglycemic agents.   4. Obstructive sleep apnea/morbid obesity: Stable. On CPAP.   5. Hypothyroidism: Continued on thyroxine replacement therapy. TSH is very low, Will lower dose of levothyroxine to 250 mcg.    Code Status: Full Code.  Family Communication: Wife at bedside.   Disposition Plan: To be determined.   HPI/Subjective: Pt is eating some but not very well per wife, Pt is still confused but recognizes self and wife.    Objective: Filed Vitals:   03/24/13 0539  BP: 160/74  Pulse: 85  Temp: 99.2 F (37.3 C)  Resp: 20    Intake/Output Summary (Last 24 hours) at 03/24/13 0943 Last data filed at 03/24/13 0932  Gross per 24 hour  Intake      0 ml  Output    300 ml   Net   -300 ml   Filed Weights   03/23/13 0532  Weight: 126.1 kg (278 lb)    Exam:  General: Comfortable, alert, communicative, disoriented, but able to answer yes/no.  HEENT: No clinical pallor, MM dry, no jaundice, no conjunctival injection or discharge.  NECK: Supple, JVP not seen, no carotid bruits, no palpable lymphadenopathy, no palpable goiter.  CHEST: Clinically clear to auscultation, no wheezes, no crackles.  HEART: Sounds 1 and 2 heard, normal, regular, no murmurs.  ABDOMEN: Obese, non-tender, no palpable organomegaly, no palpable masses, normal bowel sounds.  GENITALIA: Not examined.  LOWER EXTREMITIES: No pitting edema, palpable peripheral pulses.  MUSCULOSKELETAL SYSTEM: Generalized osteoarthritic changes, otherwise, normal.  CENTRAL NERVOUS SYSTEM: No focal neurologic deficit on gross examination.  Data Reviewed: Basic Metabolic Panel:  Recent Labs Lab 03/22/13 2250 03/23/13 0730 03/24/13 0659  NA 136 134* 132*  K 4.0 3.9 3.9  CL 100 100 97  CO2 26 22 23   GLUCOSE 54* 79 94  BUN 15 12 7   CREATININE 0.90 0.67 0.63  CALCIUM 10.1 8.8 9.2   Liver Function Tests:  Recent Labs Lab 03/22/13 2250  AST 16  ALT 18  ALKPHOS 65  BILITOT 0.9  PROT 7.9  ALBUMIN 4.4   No results found for this basename: LIPASE, AMYLASE,  in the last 168 hours No results found for this basename: AMMONIA,  in the last 168 hours CBC:  Recent Labs Lab 03/22/13 2250 03/23/13 0730 03/24/13 0659  WBC 9.6 8.8 10.2  HGB 14.2 12.6* 13.8  HCT 39.5 36.0* 38.5*  MCV 85.5 85.5  84.6  PLT 216 194 205   Cardiac Enzymes: No results found for this basename: CKTOTAL, CKMB, CKMBINDEX, TROPONINI,  in the last 168 hours BNP (last 3 results) No results found for this basename: PROBNP,  in the last 8760 hours CBG:  Recent Labs Lab 03/23/13 1017 03/23/13 1242 03/23/13 1626 03/24/13 0206 03/24/13 0557  GLUCAP 76 79 71 104* 88    Recent Results (from the past 240 hour(s))  URINE  CULTURE     Status: None   Collection Time    03/22/13 11:05 PM      Result Value Range Status   Specimen Description URINE, CLEAN CATCH   Final   Special Requests Normal   Final   Culture  Setup Time     Final   Value: 03/23/2013 00:00     Performed at [facility] Count     Final   Value: NO GROWTH     Performed at [facility]   Culture     Final   Value: NO GROWTH     Performed at [facility]   Report Status 03/24/2013 FINAL   Final  CULTURE, BLOOD (ROUTINE X 2)     Status: None   Collection Time    03/22/13 11:50 PM      Result Value Range Status   Specimen Description BLOOD RIGHT ANTECUBITAL   Final   Special Requests BOTTLES DRAWN AEROBIC ONLY 5CC   Final   Culture  Setup Time     Final   Value: 03/23/2013 04:47     Performed at [facility]   Culture     Final   Value:        BLOOD CULTURE RECEIVED NO GROWTH TO DATE CULTURE WILL BE HELD FOR 5 DAYS BEFORE ISSUING A FINAL NEGATIVE REPORT     Performed at [facility]   Report Status PENDING   Incomplete  CULTURE, BLOOD (ROUTINE X 2)     Status: None   Collection Time    03/23/13 12:13 AM      Result Value Range Status   Specimen Description BLOOD RIGHT HAND   Final   Special Requests BOTTLES DRAWN AEROBIC ONLY 5CC   Final   Culture  Setup Time     Final   Value: 03/23/2013 04:48     Performed at [facility]   Culture     Final   Value:        BLOOD CULTURE RECEIVED NO GROWTH TO DATE CULTURE WILL BE HELD FOR 5 DAYS BEFORE ISSUING A FINAL NEGATIVE REPORT     Performed at [facility]   Report Status PENDING   Incomplete  CSF CULTURE     Status: None   Collection Time    03/23/13 12:10 PM      Result Value Range Status   Specimen Description CSF   Final   Special Requests 2.1ML CSF FLUID   Final   Gram Stain     Final   Value: WBC PRESENT, PREDOMINANTLY PMN     NO ORGANISMS SEEN     Performed at [facility]     Performed at [facility]   Culture     Final   Value: NO GROWTH     Performed at [facility]   Report Status PENDING   Incomplete  GRAM STAIN     Status: None   Collection Time  03/23/13 12:10 PM      Result Value Range Status   Specimen Description CSF   Final   Special Requests 2.1 ML CSF FLUID   Final   Gram Stain     Final   Value: WBC PRESENT, PREDOMINANTLY MONONUCLEAR     NO ORGANISMS SEEN     CYTOSPIN SLIDE   Report Status 03/23/2013 FINAL   Final     Studies: Dg Chest 2 View (if Patient Has Fever And/or Copd)  03/22/2013   CLINICAL DATA:  Shortness of breath and altered mental status  EXAM: CHEST  2 VIEW  COMPARISON:  01/23/2010  FINDINGS: Mild cardiomegaly, stable from prior. No change in upper mediastinal contours. Aortic atherosclerosis.  Negative for acute infiltrate, edema, effusion, or pneumothorax. Hyperinflation with mild interstitial coarsening, suggesting chronic lung disease, possibly COPD.  IMPRESSION: No active cardiopulmonary disease.   Electronically Signed   By: Tiburcio Pea   On: 03/22/2013 21:40   Ct Head Wo Contrast  03/23/2013   CLINICAL DATA:  Altered level of consciousness.  Headache.  EXAM: CT HEAD WITHOUT CONTRAST  TECHNIQUE: Contiguous axial images were obtained from the base of the skull through the vertex without intravenous contrast.  COMPARISON:  No priors.  FINDINGS: Patchy areas of mild decreased attenuation throughout the deep and periventricular white matter of the cerebral hemispheres bilaterally, compatible with chronic microvascular ischemic disease. No acute intracranial abnormalities. Specifically, no evidence of acute intracranial hemorrhage, no definite findings of acute/subacute cerebral ischemia, no mass, mass effect, hydrocephalus or abnormal intra or extra-axial fluid collections. Visualized paranasal sinuses and mastoids are well pneumatized, with exception of a small amount of mucosal thickening in the maxillary and ethmoid sinuses  bilaterally. No acute displaced skull fractures are identified.  IMPRESSION: 1. No acute intracranial abnormalities. 2. Mild chronic microvascular ischemic changes in the cerebral white matter, as above.   Electronically Signed   By: Trudie Reed M.D.   On: 03/23/2013 01:16   Dg Fluoro Guide Lumbar Puncture  03/23/2013   CLINICAL DATA:  Headache with altered mental status. History of hypertension and diabetes.  EXAM: DIAGNOSTIC LUMBAR PUNCTURE UNDER FLUOROSCOPIC GUIDANCE  FLUOROSCOPY TIME:  15 seconds  PROCEDURE: Time out procedure was performed. Informed consent was obtained from the patient prior to the procedure, including potential complications of headache, allergy, and pain. With the patient prone, the lower back was prepped with Betadine. 1% Lidocaine was used for local anesthesia. Lumbar puncture was performed at the L2-3 level using a 12.5 cm 22 gauge needle with return of blood tinged CSF with an opening pressure of 32 cm water (measured prone through the needle). 9ml of CSF were obtained for laboratory studies. The patient tolerated the procedure well and there were no apparent complications.  IMPRESSION: Diagnostic lumbar puncture performed without any complications.   Electronically Signed   By: Roxy Horseman   On: 03/23/2013 13:07    Scheduled Meds: . acyclovir  10 mg/kg (Adjusted) Intravenous Q8H  . amLODipine  10 mg Oral Daily  . ampicillin (OMNIPEN) IV  2 g Intravenous Q4H  . benazepril  40 mg Oral BID  . cefTRIAXone (ROCEPHIN)  IV  2 g Intravenous Q12H  . dexamethasone  10 mg Intravenous Q6H  . enoxaparin (LOVENOX) injection  40 mg Subcutaneous Q24H  . insulin aspart  0-15 Units Subcutaneous Q6H  . [START ON 03/25/2013] levothyroxine  250 mcg Oral QAC breakfast  . metoprolol succinate  100 mg Oral Daily  . vancomycin  1,000  mg Intravenous Q8H   Continuous Infusions: . dextrose 5 % and 0.9 % NaCl with KCl 20 mEq/L 75 mL/hr at 03/24/13 0533    Active Problems:    Hypertension   Thyroid disease   Diabetes mellitus   Dyslipidemia   Encephalopathy acute   Fever   OSA on CPAP   Obesity   [facility]  [facility] Pager [phone]. If 7PM-7AM, please contact night-coverage at [email or web address], password [facility] 03/24/2013, 9:43 AM  LOS: 2 days

## 2013-03-25 ENCOUNTER — Telehealth: Payer: Self-pay | Admitting: *Deleted

## 2013-03-25 DIAGNOSIS — A879 Viral meningitis, unspecified: Secondary | ICD-10-CM

## 2013-03-25 DIAGNOSIS — G039 Meningitis, unspecified: Secondary | ICD-10-CM | POA: Diagnosis present

## 2013-03-25 LAB — GLUCOSE, CAPILLARY
Glucose-Capillary: 304 mg/dL — ABNORMAL HIGH (ref 70–99)
Glucose-Capillary: 300 mg/dL — ABNORMAL HIGH (ref 70–99)

## 2013-03-25 LAB — ROCKY MTN SPOTTED FVR AB, IGG-BLOOD: RMSF IgG: 2 IV — ABNORMAL HIGH

## 2013-03-25 LAB — ROCKY MTN SPOTTED FVR AB, IGM-BLOOD: RMSF IgM: 0.33 IV (ref 0.00–0.89)

## 2013-03-25 LAB — HIV-1 RNA QUANT-NO REFLEX-BLD
HIV 1 RNA Quant: <20 copies/mL (ref <20)
HIV-1 RNA Quant, Log: <1.3 {Log} (ref <1.30)

## 2013-03-25 LAB — BASIC METABOLIC PANEL
CO2: 24 mEq/L (ref 19–32)
Chloride: 97 mEq/L (ref 96–112)
Creatinine, Ser: 0.77 mg/dL (ref 0.50–1.35)
Potassium: 4.4 mEq/L (ref 3.5–5.1)

## 2013-03-25 LAB — CBC
HCT: 38.8 % — ABNORMAL LOW (ref 39.0–52.0)
MCV: 83.6 fL (ref 78.0–100.0)
Platelets: 221 10*3/uL (ref 150–400)
RBC: 4.64 MIL/uL (ref 4.22–5.81)
WBC: 7.9 10*3/uL (ref 4.0–10.5)

## 2013-03-25 MED ORDER — INSULIN ASPART 100 UNIT/ML ~~LOC~~ SOLN
0.0000 [IU] | Freq: Every day | SUBCUTANEOUS | Status: DC
  Administered 2013-03-25 – 2013-03-26 (×2): 4 [IU] via SUBCUTANEOUS

## 2013-03-25 MED ORDER — INSULIN ASPART 100 UNIT/ML ~~LOC~~ SOLN
4.0000 [IU] | Freq: Three times a day (TID) | SUBCUTANEOUS | Status: DC
  Administered 2013-03-25 – 2013-03-26 (×3): 4 [IU] via SUBCUTANEOUS

## 2013-03-25 MED ORDER — SODIUM CHLORIDE 0.9 % IV SOLN: INTRAVENOUS | Status: AC

## 2013-03-25 MED ORDER — INSULIN ASPART 100 UNIT/ML ~~LOC~~ SOLN
0.0000 [IU] | Freq: Three times a day (TID) | SUBCUTANEOUS | Status: DC
  Administered 2013-03-25: 15 [IU] via SUBCUTANEOUS
  Administered 2013-03-25: 11 [IU] via SUBCUTANEOUS
  Administered 2013-03-26 – 2013-03-27 (×4): 15 [IU] via SUBCUTANEOUS
  Administered 2013-03-27: 11 [IU] via SUBCUTANEOUS
  Administered 2013-03-27 – 2013-03-28 (×2): 7 [IU] via SUBCUTANEOUS
  Administered 2013-03-28: 11 [IU] via SUBCUTANEOUS
  Administered 2013-03-28: 4 [IU] via SUBCUTANEOUS
  Administered 2013-03-29: 11 [IU] via SUBCUTANEOUS
  Administered 2013-03-30: 3 [IU] via SUBCUTANEOUS

## 2013-03-25 NOTE — Progress Notes (Signed)
[facility] PROGRESS NOTE  Mason Patton ZOX:[id number] DOB: 07/15/1945 DOA: 03/22/2013 PCP: [name], MD  Assessment/Plan: 1. Acute encephalopathy/Meningitis: Patient presented with one week of headache, and was on Augumentin from 03/18/13, prescribed by PMD, for possible sinus infection. He became pyrexial/confused on 03/22/13, with temperature as high as 102, associated with nausea, dizziness and ataxia. Wcc was normal. CXR was negative for acute disease, head CT scan showed no acute findings, and urinalysis was negative. The clincal picture is suspicious for acute meningitis/encephalitis. Now in iv Vancomycin/Rocephin/Ampicillin, day #1. LP revealed a pleocytosis of 121, with 2 neutrophils and 83 lymphocytes, Tot Pr 106, Gluc 31. Blood cultures are pending. Otherwise, supportive treatment. RPR is reactive 1:1, Vitamin B12, folate levels, ordered. Have consulted Dr Reggie Pile, ID who has started patient on IV acyclovir and sent for more viral cultures which are pending at this time. Continue current therapy. Follow neuro status.   2. DM/Persistent hypoglycemia: Resolved now.  Now pt with hyperglycemia on steroids.  Will control with supplemental insulin and mealtime novolog, follow and adjust as needed.  Change IVFs to NS with no dextrose. Patient is a type-2 diabetic and had been on glyburide outpatient. He is not eating well. Continue D5NS, frequent CBC checks, DC'd oral hypoglycemic agents.   4. Obstructive sleep apnea/morbid obesity: Stable. On CPAP.   5. Hypothyroidism: Continued on thyroxine replacement therapy. TSH is very low, Will lower dose of levothyroxine to 250 mcg.   Code Status: Full Code.  Family Communication: Wife at bedside.  Disposition Plan: To be determined.   HPI/Subjective: Pt is more alert today and has more questions about what he is being treated for.  He is eating better per wife.  He is returning to his normal personality per wife.  He did become  confused overnight.   Objective: Filed Vitals:   03/25/13 0429  BP: 147/61  Pulse: 66  Temp: 97.9 F (36.6 C)  Resp: 18    Intake/Output Summary (Last 24 hours) at 03/25/13 1003 Last data filed at 03/25/13 0829  Gross per 24 hour  Intake    440 ml  Output   2350 ml  Net  -1910 ml   Filed Weights   03/23/13 0532  Weight: 126.1 kg (278 lb)    Exam:  General: Comfortable, awake, easily arousable, alert, communicative.  HEENT: No clinical pallor, MMM, no jaundice, no conjunctival injection or discharge.  NECK: Supple, JVP not seen, no carotid bruits, no palpable lymphadenopathy, no palpable goiter.  CHEST: Clinically clear to auscultation, no wheezes, no crackles.  HEART: Sounds 1 and 2 heard, normal, regular, no murmurs.  ABDOMEN: Obese, non-tender, no palpable organomegaly, no palpable masses, normal bowel sounds.  LOWER EXTREMITIES: No pitting edema, palpable peripheral pulses.  MUSCULOSKELETAL SYSTEM: Generalized osteoarthritic changes, otherwise, normal.  CENTRAL NERVOUS SYSTEM: No focal neurologic deficit on gross examination.  Data Reviewed: Basic Metabolic Panel:  Recent Labs Lab 03/22/13 2250 03/23/13 0730 03/24/13 0659 03/25/13 0442  NA 136 134* 132* 133*  K 4.0 3.9 3.9 4.4  CL 100 100 97 97  CO2 26 22 23 24   GLUCOSE 54* 79 94 280*  BUN 15 12 7 12   CREATININE 0.90 0.67 0.63 0.77  CALCIUM 10.1 8.8 9.2 9.1   Liver Function Tests:  Recent Labs Lab 03/22/13 2250  AST 16  ALT 18  ALKPHOS 65  BILITOT 0.9  PROT 7.9  ALBUMIN 4.4   No results found for this basename: LIPASE, AMYLASE,  in the last 168  hours No results found for this basename: AMMONIA,  in the last 168 hours CBC:  Recent Labs Lab 03/22/13 2250 03/23/13 0730 03/24/13 0659 03/25/13 0442  WBC 9.6 8.8 10.2 7.9  HGB 14.2 12.6* 13.8 14.0  HCT 39.5 36.0* 38.5* 38.8*  MCV 85.5 85.5 84.6 83.6  PLT 216 194 205 221   Cardiac Enzymes: No results found for this basename: CKTOTAL, CKMB,  CKMBINDEX, TROPONINI,  in the last 168 hours BNP (last 3 results) No results found for this basename: PROBNP,  in the last 8760 hours CBG:  Recent Labs Lab 03/24/13 0557 03/24/13 1153 03/24/13 1849 03/25/13 0009 03/25/13 0644  GLUCAP 88 115* 158* 214* 304*    Recent Results (from the past 240 hour(s))  URINE CULTURE     Status: None   Collection Time    03/22/13 11:05 PM      Result Value Range Status   Specimen Description URINE, CLEAN CATCH   Final   Special Requests Normal   Final   Culture  Setup Time     Final   Value: 03/23/2013 00:00     Performed at [facility] Count     Final   Value: NO GROWTH     Performed at [facility]   Culture     Final   Value: NO GROWTH     Performed at [facility]   Report Status 03/24/2013 FINAL   Final  CULTURE, BLOOD (ROUTINE X 2)     Status: None   Collection Time    03/22/13 11:50 PM      Result Value Range Status   Specimen Description BLOOD RIGHT ANTECUBITAL   Final   Special Requests BOTTLES DRAWN AEROBIC ONLY 5CC   Final   Culture  Setup Time     Final   Value: 03/23/2013 04:47     Performed at [facility]   Culture     Final   Value:        BLOOD CULTURE RECEIVED NO GROWTH TO DATE CULTURE WILL BE HELD FOR 5 DAYS BEFORE ISSUING A FINAL NEGATIVE REPORT     Performed at [facility]   Report Status PENDING   Incomplete  CULTURE, BLOOD (ROUTINE X 2)     Status: None   Collection Time    03/23/13 12:13 AM      Result Value Range Status   Specimen Description BLOOD RIGHT HAND   Final   Special Requests BOTTLES DRAWN AEROBIC ONLY 5CC   Final   Culture  Setup Time     Final   Value: 03/23/2013 04:48     Performed at [facility]   Culture     Final   Value:        BLOOD CULTURE RECEIVED NO GROWTH TO DATE CULTURE WILL BE HELD FOR 5 DAYS BEFORE ISSUING A FINAL NEGATIVE REPORT     Performed at [facility]   Report Status PENDING   Incomplete  CSF CULTURE      Status: None   Collection Time    03/23/13 12:10 PM      Result Value Range Status   Specimen Description CSF   Final   Special Requests 2.1ML CSF FLUID   Final   Gram Stain     Final   Value: WBC PRESENT, PREDOMINANTLY PMN     NO ORGANISMS SEEN     Performed at [facility]  Performed at [facility]     Final   Value: NO GROWTH     Performed at [facility]   Report Status PENDING   Incomplete  GRAM STAIN     Status: None   Collection Time    03/23/13 12:10 PM      Result Value Range Status   Specimen Description CSF   Final   Special Requests 2.1 ML CSF FLUID   Final   Gram Stain     Final   Value: WBC PRESENT, PREDOMINANTLY MONONUCLEAR     NO ORGANISMS SEEN     CYTOSPIN SLIDE   Report Status 03/23/2013 FINAL   Final     Studies: Dg Fluoro Guide Lumbar Puncture  03/23/2013   CLINICAL DATA:  Headache with altered mental status. History of hypertension and diabetes.  EXAM: DIAGNOSTIC LUMBAR PUNCTURE UNDER FLUOROSCOPIC GUIDANCE  FLUOROSCOPY TIME:  15 seconds  PROCEDURE: Time out procedure was performed. Informed consent was obtained from the patient prior to the procedure, including potential complications of headache, allergy, and pain. With the patient prone, the lower back was prepped with Betadine. 1% Lidocaine was used for local anesthesia. Lumbar puncture was performed at the L2-3 level using a 12.5 cm 22 gauge needle with return of blood tinged CSF with an opening pressure of 32 cm water (measured prone through the needle). 9ml of CSF were obtained for laboratory studies. The patient tolerated the procedure well and there were no apparent complications.  IMPRESSION: Diagnostic lumbar puncture performed without any complications.   Electronically Signed   By: Roxy Horseman   On: 03/23/2013 13:07    Scheduled Meds: . acyclovir  10 mg/kg (Adjusted) Intravenous Q8H  . amLODipine  10 mg Oral Daily  . ampicillin (OMNIPEN) IV  2 g Intravenous  Q4H  . benazepril  40 mg Oral BID  . cefTRIAXone (ROCEPHIN)  IV  2 g Intravenous Q12H  . dexamethasone  10 mg Intravenous Q6H  . enoxaparin (LOVENOX) injection  40 mg Subcutaneous Q24H  . influenza vac split quadrivalent PF  0.5 mL Intramuscular Tomorrow-1000  . insulin aspart  0-20 Units Subcutaneous TID WC  . insulin aspart  0-5 Units Subcutaneous QHS  . insulin aspart  4 Units Subcutaneous TID WC  . levothyroxine  250 mcg Oral QAC breakfast  . metoprolol succinate  100 mg Oral Daily  . vancomycin  1,000 mg Intravenous Q8H   Continuous Infusions: . sodium chloride      Active Problems:   Hypertension   Thyroid disease   Diabetes mellitus   Dyslipidemia   Encephalopathy acute   Fever   OSA on CPAP   Obesity   Acel Natzke [employer] [phone]. If 7PM-7AM, please contact night-coverage at [email or web address], password [facility] 03/25/2013, 10:03 AM  LOS: 3 days

## 2013-03-25 NOTE — Telephone Encounter (Signed)
Left VM for patient to call office.

## 2013-03-25 NOTE — Progress Notes (Signed)
Pt. Has his cpap from home and states he can place on himself. RT informed pt. And family to notify if any assistance is needed.

## 2013-03-25 NOTE — Progress Notes (Addendum)
INFECTIOUS DISEASE PROGRESS NOTE  ID: Mason Patton is a 67 y.o. male with  Principal Problem:   Meningitis Active Problems:   Hypertension   Thyroid disease   Diabetes mellitus   Dyslipidemia   Encephalopathy acute   Fever   OSA on CPAP   Obesity  Subjective: Awake and alert. Hungry.   Abtx:  Anti-infectives   Start     Dose/Rate Route Frequency Ordered Stop   03/23/13 2200  acyclovir (ZOVIRAX) 940 mg in dextrose 5 % 150 mL IVPB     10 mg/kg  94.2 kg (Adjusted) 168.8 mL/hr over 60 Minutes Intravenous 3 times per day 03/23/13 2048     03/23/13 1400  vancomycin (VANCOCIN) IVPB 1000 mg/200 mL premix     1,000 mg 200 mL/hr over 60 Minutes Intravenous Every 8 hours 03/23/13 0648     03/23/13 1400  ampicillin (OMNIPEN) 2 g in sodium chloride 0.9 % 50 mL IVPB     2 g 150 mL/hr over 20 Minutes Intravenous 6 times per day 03/23/13 1347     03/23/13 1000  cefTRIAXone (ROCEPHIN) 2 g in dextrose 5 % 50 mL IVPB     2 g 100 mL/hr over 30 Minutes Intravenous Every 12 hours 03/23/13 0630     03/23/13 0700  vancomycin (VANCOCIN) 2,500 mg in sodium chloride 0.9 % 500 mL IVPB     2,500 mg 250 mL/hr over 120 Minutes Intravenous  Once 03/23/13 0648 03/23/13 0932      Medications:  Scheduled: . acyclovir  10 mg/kg (Adjusted) Intravenous Q8H  . amLODipine  10 mg Oral Daily  . ampicillin (OMNIPEN) IV  2 g Intravenous Q4H  . benazepril  40 mg Oral BID  . cefTRIAXone (ROCEPHIN)  IV  2 g Intravenous Q12H  . dexamethasone  10 mg Intravenous Q6H  . enoxaparin (LOVENOX) injection  40 mg Subcutaneous Q24H  . insulin aspart  0-20 Units Subcutaneous TID WC  . insulin aspart  0-5 Units Subcutaneous QHS  . insulin aspart  4 Units Subcutaneous TID WC  . levothyroxine  250 mcg Oral QAC breakfast  . metoprolol succinate  100 mg Oral Daily  . vancomycin  1,000 mg Intravenous Q8H    Objective: Vital signs in last 24 hours: Temp:  [97.3 F (36.3 C)-102.8 F (39.3 C)] 97.3 F (36.3 C)  (09/18 1000) Pulse Rate:  [66-88] 67 (09/18 1000) Resp:  [18-24] 18 (09/18 1000) BP: (137-186)/(61-79) 137/72 mmHg (09/18 1000) SpO2:  [94 %-100 %] 94 % (09/18 1000)   General appearance: alert, cooperative and no distress Resp: clear to auscultation bilaterally Cardio: regular rate and rhythm GI: normal findings: bowel sounds normal and soft, non-tender  Lab Results  Recent Labs  03/24/13 0659 03/25/13 0442  WBC 10.2 7.9  HGB 13.8 14.0  HCT 38.5* 38.8*  NA 132* 133*  K 3.9 4.4  CL 97 97  CO2 23 24  BUN 7 12  CREATININE 0.63 0.77   Liver Panel  Recent Labs  03/22/13 2250  PROT 7.9  ALBUMIN 4.4  AST 16  ALT 18  ALKPHOS 65  BILITOT 0.9   Sedimentation Rate No results found for this basename: ESRSEDRATE,  in the last 72 hours C-Reactive Protein No results found for this basename: CRP,  in the last 72 hours  Microbiology: Recent Results (from the past 240 hour(s))  URINE CULTURE     Status: None   Collection Time    03/22/13 11:05 PM  Result Value Range Status   Specimen Description URINE, CLEAN CATCH   Final   Special Requests Normal   Final   Culture  Setup Time     Final   Value: 03/23/2013 00:00     Performed at [facility] Count     Final   Value: NO GROWTH     Performed at [facility]   Culture     Final   Value: NO GROWTH     Performed at [facility]   Report Status 03/24/2013 FINAL   Final  CULTURE, BLOOD (ROUTINE X 2)     Status: None   Collection Time    03/22/13 11:50 PM      Result Value Range Status   Specimen Description BLOOD RIGHT ANTECUBITAL   Final   Special Requests BOTTLES DRAWN AEROBIC ONLY 5CC   Final   Culture  Setup Time     Final   Value: 03/23/2013 04:47     Performed at [facility]   Culture     Final   Value:        BLOOD CULTURE RECEIVED NO GROWTH TO DATE CULTURE WILL BE HELD FOR 5 DAYS BEFORE ISSUING A FINAL NEGATIVE REPORT     Performed at [facility]    Report Status PENDING   Incomplete  CULTURE, BLOOD (ROUTINE X 2)     Status: None   Collection Time    03/23/13 12:13 AM      Result Value Range Status   Specimen Description BLOOD RIGHT HAND   Final   Special Requests BOTTLES DRAWN AEROBIC ONLY 5CC   Final   Culture  Setup Time     Final   Value: 03/23/2013 04:48     Performed at [facility]   Culture     Final   Value:        BLOOD CULTURE RECEIVED NO GROWTH TO DATE CULTURE WILL BE HELD FOR 5 DAYS BEFORE ISSUING A FINAL NEGATIVE REPORT     Performed at [facility]   Report Status PENDING   Incomplete  CSF CULTURE     Status: None   Collection Time    03/23/13 12:10 PM      Result Value Range Status   Specimen Description CSF   Final   Special Requests 2.1ML CSF FLUID   Final   Gram Stain     Final   Value: WBC PRESENT, PREDOMINANTLY PMN     NO ORGANISMS SEEN     Performed at [facility]     Performed at [facility]   Culture     Final   Value: NO GROWTH     Performed at [facility]   Report Status PENDING   Incomplete  GRAM STAIN     Status: None   Collection Time    03/23/13 12:10 PM      Result Value Range Status   Specimen Description CSF   Final   Special Requests 2.1 ML CSF FLUID   Final   Gram Stain     Final   Value: WBC PRESENT, PREDOMINANTLY MONONUCLEAR     NO ORGANISMS SEEN     CYTOSPIN SLIDE   Report Status 03/23/2013 FINAL   Final    Studies/Results: No results found.   Assessment/Plan: Mental Status change Aseptic Meningitis (vs partially treated meningitis)  Total days of antibiotics: 2 (vanco/ceftriaxone/amp/ACV/steroids)  Much better although  febrile overnight. HSV (-) will stop acv Cx pending. Multiple serologies and PCR  Pending (RMSF, Ehrlichia, Arbovirus, Enterovirus)          Johny Sax Infectious Diseases (pager) [phone] [email or web address] 03/25/2013, 12:19 PM  LOS: 3 days

## 2013-03-25 NOTE — Progress Notes (Signed)
Pt. Has home CPAP unit at bedside. Added distilled H20 to water chamber.  Lid would not close properly. Pt. Wife at bedside stated pt. Dropped unit a few nights ago and it will not close.  I explained I did not want to force it closed. She stated when pt. Was feeling better he would be able to fix it. Machine is working properly and there are no obvious leaks.  2 lpm O2 bled in.  Spo2 is 92%. Pt. Tolerating well at this time.

## 2013-03-25 NOTE — Telephone Encounter (Signed)
Message copied by Lindell Spar on Thu Mar 25, 2013  3:02 PM ------      Message from: Chrystie Nose      Created: Wed Mar 24, 2013  3:45 PM       Please notify patient that the echo showed only mild abnormalities.  Nothing further to do at this time.            -Dr. Rennis Golden       ------

## 2013-03-26 DIAGNOSIS — R7309 Other abnormal glucose: Secondary | ICD-10-CM

## 2013-03-26 DIAGNOSIS — G0490 Encephalitis and encephalomyelitis, unspecified: Secondary | ICD-10-CM

## 2013-03-26 DIAGNOSIS — R739 Hyperglycemia, unspecified: Secondary | ICD-10-CM | POA: Diagnosis not present

## 2013-03-26 LAB — EHRLICHIA ANTIBODY PANEL
E chaffeensis (HGE) Ab, IgG: <1:64 {titer}
E chaffeensis (HGE) Ab, IgM: <1:20 {titer}

## 2013-03-26 LAB — GLUCOSE, CAPILLARY
Glucose-Capillary: 324 mg/dL — ABNORMAL HIGH (ref 70–99)
Glucose-Capillary: 324 mg/dL — ABNORMAL HIGH (ref 70–99)

## 2013-03-26 LAB — VANCOMYCIN, TROUGH: Vancomycin Tr: 14.9 ug/mL (ref 10.0–20.0)

## 2013-03-26 MED ORDER — INSULIN GLARGINE 100 UNIT/ML ~~LOC~~ SOLN
20.0000 [IU] | Freq: Every day | SUBCUTANEOUS | Status: DC
  Administered 2013-03-26 – 2013-03-27 (×2): 20 [IU] via SUBCUTANEOUS
  Filled 2013-03-26 (×2): qty 0.2

## 2013-03-26 MED ORDER — INSULIN ASPART 100 UNIT/ML ~~LOC~~ SOLN
8.0000 [IU] | Freq: Three times a day (TID) | SUBCUTANEOUS | Status: DC
  Administered 2013-03-26 – 2013-03-27 (×3): 8 [IU] via SUBCUTANEOUS

## 2013-03-26 NOTE — Progress Notes (Signed)
Patient experiencing right sided facial and upper extremity numbness.  Paged the MD, put the head of the bed flat, 2L 02 applied to patiend and 500 cc bolus instituted.  Awaiting doctor call back.  Lance Bosch, RN

## 2013-03-26 NOTE — Progress Notes (Signed)
Patient says this numbness has been transient over the last couple of weeks.  Dr. Laural Benes aware and said to monitor the patient.  Lance Bosch, RN

## 2013-03-26 NOTE — Progress Notes (Signed)
Inpatient Diabetes Program Recommendations  AACE/ADA: New Consensus Statement on Inpatient Glycemic Control (2013)  Target Ranges:  Prepandial:   less than 140 mg/dL      Peak postprandial:   less than 180 mg/dL (1-2 hours)      Critically ill patients:  140 - 180 mg/dL  Results for [name], [name] (MRN [id number]) as of 03/26/2013 09:48  Ref. Range 03/25/2013 06:44 03/25/2013 11:35 03/25/2013 19:40 03/25/2013 21:30 03/26/2013 06:32  Glucose-Capillary Latest Range: 70-99 mg/dL 409 (H) 811 (H) 914 (H) 323 (H) 324 (H)   Inpatient Diabetes Program Recommendations Insulin - Basal: consider adding basal during steroid therapy  Oral Agents: discontinued during hospitalization   Thank you  Piedad Climes BSN, RN,CDE Inpatient Diabetes Coordinator [phone] (team pager)

## 2013-03-26 NOTE — Progress Notes (Signed)
[facility] PROGRESS NOTE  Mason Patton YQM:[id number] DOB: 1946-03-08 DOA: 03/22/2013 PCP: [name], MD  Assessment/Plan: 1. Acute encephalopathy/Meningitis: Aseptic meningitis vs partially treated meningitis.  Pt is improving daily.  Patient presented with one week of headache, and was on Augumentin from 03/18/13, prescribed by PMD, for possible sinus infection. He became pyrexial/confused on 03/22/13, with temperature as high as 102, associated with nausea, dizziness and ataxia. Wcc was normal. CXR was negative for acute disease, head CT scan showed no acute findings, and urinalysis was negative. The clincal picture is suspicious for acute meningitis/encephalitis. Now on iv Vancomycin/Rocephin/Ampicillin, day #3. LP revealed a pleocytosis of 121, with 2 neutrophils and 83 lymphocytes, Tot Pr 106, Gluc 31. Blood cultures NGTD. Continue supportive treatment. RPR is reactive 1:1, Vitamin B12, folate levels, ordered. Have consulted Dr Reggie Pile, ID.  HSV negative.  Off IV acyclovir. Continue current therapy. Follow neuro statu which is improving.   2. DM/Hyperglycemia: Resolved now. Now pt with hyperglycemia on steroids. Adding basal lantus 20 units.  Titrate as needed.  Continue supplemental insulin and increasing mealtime novolog, follow and adjust as needed. Changed IVFs to NS with no dextrose. Patient is a type-2 diabetic and had been on glyburide outpatient. He is not eating well. Continue frequent CBC checks, DC'd oral hypoglycemic agents.   4. Obstructive sleep apnea/morbid obesity: Stable. On CPAP.   5. Hypothyroidism: Continued on thyroxine replacement therapy. TSH is very low, Will lower dose of levothyroxine to 250 mcg.   Code Status: Full Code.  Family Communication: Wife at bedside.  Disposition Plan: To be determined.   HPI/Subjective: Pt says he is starting to feel better.  He is not eating very well.  No complaints.   Objective: Filed Vitals:   03/26/13 0602   BP: 143/75  Pulse: 66  Temp: 98.4 F (36.9 C)  Resp: 18    Intake/Output Summary (Last 24 hours) at 03/26/13 1031 Last data filed at 03/26/13 0851  Gross per 24 hour  Intake   1120 ml  Output   1250 ml  Net   -130 ml   Filed Weights   03/23/13 0532  Weight: 126.1 kg (278 lb)    Exam:  General: Comfortable, awake, easily arousable, alert, communicative.  HEENT: No clinical pallor, MMM, no jaundice, no conjunctival injection or discharge.  NECK: Supple, JVP not seen, no carotid bruits, no palpable lymphadenopathy, no palpable goiter.  CHEST: Clinically clear to auscultation, no wheezes, no crackles.  HEART: Sounds 1 and 2 heard, normal, regular, no murmurs.  ABDOMEN: Obese, non-tender, no palpable organomegaly, no palpable masses, normal bowel sounds.  LOWER EXTREMITIES: No pitting edema, palpable peripheral pulses.  MUSCULOSKELETAL SYSTEM: Generalized osteoarthritic changes, otherwise, normal.  CENTRAL NERVOUS SYSTEM: No focal neurologic deficit on gross examination.   Data Reviewed: Basic Metabolic Panel:  Recent Labs Lab 03/22/13 2250 03/23/13 0730 03/24/13 0659 03/25/13 0442  NA 136 134* 132* 133*  K 4.0 3.9 3.9 4.4  CL 100 100 97 97  CO2 26 22 23 24   GLUCOSE 54* 79 94 280*  BUN 15 12 7 12   CREATININE 0.90 0.67 0.63 0.77  CALCIUM 10.1 8.8 9.2 9.1   Liver Function Tests:  Recent Labs Lab 03/22/13 2250  AST 16  ALT 18  ALKPHOS 65  BILITOT 0.9  PROT 7.9  ALBUMIN 4.4   No results found for this basename: LIPASE, AMYLASE,  in the last 168 hours No results found for this basename: AMMONIA,  in the last 168 hours  CBC:  Recent Labs Lab 03/22/13 2250 03/23/13 0730 03/24/13 0659 03/25/13 0442  WBC 9.6 8.8 10.2 7.9  HGB 14.2 12.6* 13.8 14.0  HCT 39.5 36.0* 38.5* 38.8*  MCV 85.5 85.5 84.6 83.6  PLT 216 194 205 221   Cardiac Enzymes: No results found for this basename: CKTOTAL, CKMB, CKMBINDEX, TROPONINI,  in the last 168 hours BNP (last 3  results) No results found for this basename: PROBNP,  in the last 8760 hours CBG:  Recent Labs Lab 03/25/13 0644 03/25/13 1135 03/25/13 1940 03/25/13 2130 03/26/13 0632  GLUCAP 304* 300* 312* 323* 324*    Recent Results (from the past 240 hour(s))  URINE CULTURE     Status: None   Collection Time    03/22/13 11:05 PM      Result Value Range Status   Specimen Description URINE, CLEAN CATCH   Final   Special Requests Normal   Final   Culture  Setup Time     Final   Value: 03/23/2013 00:00     Performed at [facility] Count     Final   Value: NO GROWTH     Performed at [facility]   Culture     Final   Value: NO GROWTH     Performed at [facility]   Report Status 03/24/2013 FINAL   Final  CULTURE, BLOOD (ROUTINE X 2)     Status: None   Collection Time    03/22/13 11:50 PM      Result Value Range Status   Specimen Description BLOOD RIGHT ANTECUBITAL   Final   Special Requests BOTTLES DRAWN AEROBIC ONLY 5CC   Final   Culture  Setup Time     Final   Value: 03/23/2013 04:47     Performed at [facility]   Culture     Final   Value:        BLOOD CULTURE RECEIVED NO GROWTH TO DATE CULTURE WILL BE HELD FOR 5 DAYS BEFORE ISSUING A FINAL NEGATIVE REPORT     Performed at [facility]   Report Status PENDING   Incomplete  CULTURE, BLOOD (ROUTINE X 2)     Status: None   Collection Time    03/23/13 12:13 AM      Result Value Range Status   Specimen Description BLOOD RIGHT HAND   Final   Special Requests BOTTLES DRAWN AEROBIC ONLY 5CC   Final   Culture  Setup Time     Final   Value: 03/23/2013 04:48     Performed at [facility]   Culture     Final   Value:        BLOOD CULTURE RECEIVED NO GROWTH TO DATE CULTURE WILL BE HELD FOR 5 DAYS BEFORE ISSUING A FINAL NEGATIVE REPORT     Performed at [facility]   Report Status PENDING   Incomplete  CSF CULTURE     Status: None   Collection Time    03/23/13 12:10 PM       Result Value Range Status   Specimen Description CSF   Final   Special Requests 2.1ML CSF FLUID   Final   Gram Stain     Final   Value: WBC PRESENT, PREDOMINANTLY PMN     NO ORGANISMS SEEN     Performed at [facility]     Performed at [facility]   Culture     Final  Value: NO GROWTH 1 DAY     Performed at [facility]   Report Status PENDING   Incomplete  GRAM STAIN     Status: None   Collection Time    03/23/13 12:10 PM      Result Value Range Status   Specimen Description CSF   Final   Special Requests 2.1 ML CSF FLUID   Final   Gram Stain     Final   Value: WBC PRESENT, PREDOMINANTLY MONONUCLEAR     NO ORGANISMS SEEN     CYTOSPIN SLIDE   Report Status 03/23/2013 FINAL   Final     Studies: No results found.  Scheduled Meds: . amLODipine  10 mg Oral Daily  . ampicillin (OMNIPEN) IV  2 g Intravenous Q4H  . benazepril  40 mg Oral BID  . cefTRIAXone (ROCEPHIN)  IV  2 g Intravenous Q12H  . dexamethasone  10 mg Intravenous Q6H  . enoxaparin (LOVENOX) injection  40 mg Subcutaneous Q24H  . insulin aspart  0-20 Units Subcutaneous TID WC  . insulin aspart  0-5 Units Subcutaneous QHS  . insulin aspart  8 Units Subcutaneous TID WC  . insulin glargine  20 Units Subcutaneous Daily  . levothyroxine  250 mcg Oral QAC breakfast  . metoprolol succinate  100 mg Oral Daily  . vancomycin  1,000 mg Intravenous Q8H   Continuous Infusions:   Principal Problem:   Meningitis Active Problems:   Hypertension   Thyroid disease   Diabetes mellitus   Dyslipidemia   Encephalopathy acute   Fever   OSA on CPAP   Obesity   [employer] [phone]. If 7PM-7AM, please contact night-coverage at [email or web address], password [facility] 03/26/2013, 10:31 AM  LOS: 4 days

## 2013-03-26 NOTE — Progress Notes (Signed)
[facility]  Day # 3 vancomycin, ampicillin, ceftriaxone   Subjective: No new complaints, feeling better,    Antibiotics:  Anti-infectives   Start     Dose/Rate Route Frequency Ordered Stop   03/23/13 2200  acyclovir (ZOVIRAX) 940 mg in dextrose 5 % 150 mL IVPB  Status:  Discontinued     10 mg/kg  94.2 kg (Adjusted) 168.8 mL/hr over 60 Minutes Intravenous 3 times per day 03/23/13 2048 03/25/13 1222   03/23/13 1400  vancomycin (VANCOCIN) IVPB 1000 mg/200 mL premix  Status:  Discontinued     1,000 mg 200 mL/hr over 60 Minutes Intravenous Every 8 hours 03/23/13 0648 03/26/13 1718   03/23/13 1400  ampicillin (OMNIPEN) 2 g in sodium chloride 0.9 % 50 mL IVPB     2 g 150 mL/hr over 20 Minutes Intravenous 6 times per day 03/23/13 1347     03/23/13 1000  cefTRIAXone (ROCEPHIN) 2 g in dextrose 5 % 50 mL IVPB     2 g 100 mL/hr over 30 Minutes Intravenous Every 12 hours 03/23/13 0630     03/23/13 0700  vancomycin (VANCOCIN) 2,500 mg in sodium chloride 0.9 % 500 mL IVPB     2,500 mg 250 mL/hr over 120 Minutes Intravenous  Once 03/23/13 0648 03/23/13 0932      Medications: Scheduled Meds: . amLODipine  10 mg Oral Daily  . ampicillin (OMNIPEN) IV  2 g Intravenous Q4H  . benazepril  40 mg Oral BID  . cefTRIAXone (ROCEPHIN)  IV  2 g Intravenous Q12H  . dexamethasone  10 mg Intravenous Q6H  . enoxaparin (LOVENOX) injection  40 mg Subcutaneous Q24H  . insulin aspart  0-20 Units Subcutaneous TID WC  . insulin aspart  0-5 Units Subcutaneous QHS  . insulin aspart  8 Units Subcutaneous TID WC  . insulin glargine  20 Units Subcutaneous Daily  . levothyroxine  250 mcg Oral QAC breakfast  . metoprolol succinate  100 mg Oral Daily   Continuous Infusions:  PRN Meds:.acetaminophen, acetaminophen, alum & mag hydroxide-simeth, dextrose, ondansetron (ZOFRAN) IV, ondansetron, senna-docusate   Objective: Weight change:   Intake/Output Summary (Last 24 hours) at  03/26/13 1718 Last data filed at 03/26/13 0851  Gross per 24 hour  Intake   1120 ml  Output    450 ml  Net    670 ml   Blood pressure 131/67, pulse 64, temperature 97.4 F (36.3 C), temperature source Oral, resp. rate 64, height 5\' 10"  (1.778 m), weight 278 lb (126.1 kg), SpO2 98.00%. Temp:  [97.3 F (36.3 C)-98.4 F (36.9 C)] 97.4 F (36.3 C) (09/19 1406) Pulse Rate:  [63-66] 64 (09/19 1406) Resp:  [18-64] 64 (09/19 1406) BP: (131-143)/(66-75) 131/67 mmHg (09/19 1406) SpO2:  [95 %-100 %] 98 % (09/19 1406)  Physical Exam: General: Alert and awake, oriented x3,  HEENT: anicteric sclera, pupils reactive to light and accommodation, EOMI CVS regular rate, normal r,  no murmur rubs or gallops Chest: clear to auscultation bilaterally, no wheezing, rales or rhonchi Abdomen: soft nontender, nondistended, normal bowel sounds, Skin: no rashes Neuro: nonfocal  Lab Results:  Recent Labs  03/24/13 0659 03/25/13 0442  WBC 10.2 7.9  HGB 13.8 14.0  HCT 38.5* 38.8*  PLT 205 221    BMET  Recent Labs  03/24/13 0659 03/25/13 0442  NA 132* 133*  K 3.9 4.4  CL 97 97  CO2 23 24  GLUCOSE 94 280*  BUN 7 12  CREATININE  0.63 0.77  CALCIUM 9.2 9.1    Micro Results: Recent Results (from the past 240 hour(s))  URINE CULTURE     Status: None   Collection Time    03/22/13 11:05 PM      Result Value Range Status   Specimen Description URINE, CLEAN CATCH   Final   Special Requests Normal   Final   Culture  Setup Time     Final   Value: 03/23/2013 00:00     Performed at [facility] Count     Final   Value: NO GROWTH     Performed at [facility]   Culture     Final   Value: NO GROWTH     Performed at [facility]   Report Status 03/24/2013 FINAL   Final  CULTURE, BLOOD (ROUTINE X 2)     Status: None   Collection Time    03/22/13 11:50 PM      Result Value Range Status   Specimen Description BLOOD RIGHT ANTECUBITAL   Final   Special Requests  BOTTLES DRAWN AEROBIC ONLY 5CC   Final   Culture  Setup Time     Final   Value: 03/23/2013 04:47     Performed at [facility]   Culture     Final   Value:        BLOOD CULTURE RECEIVED NO GROWTH TO DATE CULTURE WILL BE HELD FOR 5 DAYS BEFORE ISSUING A FINAL NEGATIVE REPORT     Performed at [facility]   Report Status PENDING   Incomplete  CULTURE, BLOOD (ROUTINE X 2)     Status: None   Collection Time    03/23/13 12:13 AM      Result Value Range Status   Specimen Description BLOOD RIGHT HAND   Final   Special Requests BOTTLES DRAWN AEROBIC ONLY 5CC   Final   Culture  Setup Time     Final   Value: 03/23/2013 04:48     Performed at [facility]   Culture     Final   Value:        BLOOD CULTURE RECEIVED NO GROWTH TO DATE CULTURE WILL BE HELD FOR 5 DAYS BEFORE ISSUING A FINAL NEGATIVE REPORT     Performed at [facility]   Report Status PENDING   Incomplete  CSF CULTURE     Status: None   Collection Time    03/23/13 12:10 PM      Result Value Range Status   Specimen Description CSF   Final   Special Requests 2.1ML CSF FLUID   Final   Gram Stain     Final   Value: WBC PRESENT, PREDOMINANTLY PMN     NO ORGANISMS SEEN     Performed at [facility]     Performed at [facility]   Culture     Final   Value: NO GROWTH 2 DAYS     Performed at [facility]   Report Status PENDING   Incomplete  GRAM STAIN     Status: None   Collection Time    03/23/13 12:10 PM      Result Value Range Status   Specimen Description CSF   Final   Special Requests 2.1 ML CSF FLUID   Final   Gram Stain     Final   Value: WBC PRESENT, PREDOMINANTLY MONONUCLEAR     NO ORGANISMS SEEN  CYTOSPIN SLIDE   Report Status 03/23/2013 FINAL   Final    Studies/Results: No results found.    Assessment/Plan: Mason Patton is a 67 y.o. male with  Meningoencephalitis, likeyl partly treated bacterial meningitis with the augmentin being the antibiotic  that was sufficient to make a spinal fluid cultures did not grow any organisms and convert what may have been an initial neutrophilic pleocytosis to a lymphocytic pleocytosis. 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 will discontinue the vancomycin given lack of growth of any organism on spinal fluid culture and given the fact that methicillin resistant staph aureus and penicillin resistant pneumococcus would be expected to have grown in his presence of amoxicillin.  --his steroid should be stopped after day #4  --Less the patient grows a specific organism from spinal fluid he will need to be treated for 2 weeks with high dose IV ceftriaxone 2 g IV every 12 hours and for a total of 3 weeks with high-dose IV ampicillin.  --place PICC line  #2 RPR positive: at titer of 1:1 this likely represents previously treated infection  [facility] phone numbe [phone] should be able to give data on prior RPR's and treatment.  He would not be expected to have Neurosyphilis with a titer of 1:1  #3 RMSF IgG : c/w PRIOR infection not recent infection  #4 STD Screen: HIV RNA negative  I will followup cultures and will arrange for HSFU in our clinic   LOS: 4 days   Acey Lav 03/26/2013, 5:18 PM

## 2013-03-26 NOTE — Progress Notes (Signed)
UR complete.  Alyra Patty RN, MSN 

## 2013-03-26 NOTE — Progress Notes (Signed)
ANTIBIOTIC CONSULT NOTE - Follow Up  Pharmacy Consult for Vancomycin/Rocephin/Ampicillin Indication: r/o meningitis  Allergies  Allergen Reactions  . Codeine Nausea And Vomiting  . Demerol Nausea And Vomiting   Patient Measurements: Height: 5\' 10"  (177.8 cm) Weight: 278 lb (126.1 kg) IBW/kg (Calculated) : 73  Vital Signs: Temp: 98.4 F (36.9 C) (09/19 0602) Temp src: Oral (09/19 0602) BP: 143/75 mmHg (09/19 0602) Pulse Rate: 66 (09/19 0602)  Labs:  Recent Labs  03/24/13 0659 03/25/13 0442  WBC 10.2 7.9  HGB 13.8 14.0  PLT 205 221  CREATININE 0.63 0.77   Estimated Creatinine Clearance: 119.4 ml/min (by C-G formula based on Cr of 0.77).   Microbiology: Recent Results (from the past 720 hour(s))  URINE CULTURE     Status: None   Collection Time    03/22/13 11:05 PM      Result Value Range Status   Specimen Description URINE, CLEAN CATCH   Final   Special Requests Normal   Final   Culture  Setup Time     Final   Value: 03/23/2013 00:00     Performed at [facility] Count     Final   Value: NO GROWTH     Performed at [facility]   Culture     Final   Value: NO GROWTH     Performed at [facility]   Report Status 03/24/2013 FINAL   Final  CULTURE, BLOOD (ROUTINE X 2)     Status: None   Collection Time    03/22/13 11:50 PM      Result Value Range Status   Specimen Description BLOOD RIGHT ANTECUBITAL   Final   Special Requests BOTTLES DRAWN AEROBIC ONLY 5CC   Final   Culture  Setup Time     Final   Value: 03/23/2013 04:47     Performed at [facility]   Culture     Final   Value:        BLOOD CULTURE RECEIVED NO GROWTH TO DATE CULTURE WILL BE HELD FOR 5 DAYS BEFORE ISSUING A FINAL NEGATIVE REPORT     Performed at [facility]   Report Status PENDING   Incomplete  CULTURE, BLOOD (ROUTINE X 2)     Status: None   Collection Time    03/23/13 12:13 AM      Result Value Range Status   Specimen Description BLOOD  RIGHT HAND   Final   Special Requests BOTTLES DRAWN AEROBIC ONLY 5CC   Final   Culture  Setup Time     Final   Value: 03/23/2013 04:48     Performed at [facility]   Culture     Final   Value:        BLOOD CULTURE RECEIVED NO GROWTH TO DATE CULTURE WILL BE HELD FOR 5 DAYS BEFORE ISSUING A FINAL NEGATIVE REPORT     Performed at [facility]   Report Status PENDING   Incomplete  CSF CULTURE     Status: None   Collection Time    03/23/13 12:10 PM      Result Value Range Status   Specimen Description CSF   Final   Special Requests 2.1ML CSF FLUID   Final   Gram Stain     Final   Value: WBC PRESENT, PREDOMINANTLY PMN     NO ORGANISMS SEEN     Performed at [facility]     Performed  at [employer]     Final   Value: NO GROWTH 2 DAYS     Performed at [facility]   Report Status PENDING   Incomplete  GRAM STAIN     Status: None   Collection Time    03/23/13 12:10 PM      Result Value Range Status   Specimen Description CSF   Final   Special Requests 2.1 ML CSF FLUID   Final   Gram Stain     Final   Value: WBC PRESENT, PREDOMINANTLY MONONUCLEAR     NO ORGANISMS SEEN     CYTOSPIN SLIDE   Report Status 03/23/2013 FINAL   Final   Medical History: Past Medical History  Diagnosis Date  . Hypertension   . Diabetes mellitus   . Thyroid disease   . Diabetes mellitus     Type 2  . Obstructive sleep apnea of adult   . Gallstones and inflammation of gallbladder without obstruction   . Cholecystitis with cholelithiasis   . Arthritis   . Emphysema of lung   . Hyperlipidemia   . Eczema   . Umbilical hernia   . Tobacco abuse     quit 11/08/2012  . Obesity   . History of nuclear stress test 02/23/2010    dipyridamole; normal pattern of perfusion in all regions; normal, low risk study    Medications:  Prescriptions prior to admission  Medication Sig Dispense Refill  . amLODipine (NORVASC) 10 MG tablet Take 10 mg by mouth daily.       [name] amoxicillin-clavulanate (AUGMENTIN) 875-125 MG per tablet Take 1 tablet by mouth 2 (two) times daily. For 10 days start 9.11.14 end 9.21.14      . aspirin 81 MG tablet Take 81 mg by mouth daily.      . benazepril (LOTENSIN) 40 MG tablet Take 40 mg by mouth 2 (two) times daily.      . fexofenadine (ALLEGRA) 180 MG tablet Take 180 mg by mouth daily.      [name] glyBURIDE-metformin (GLUCOVANCE) 5-500 MG per tablet Take 1 tablet by mouth 3 (three) times daily.       [name] levothyroxine (SYNTHROID, LEVOTHROID) 300 MCG tablet Take 300 mcg by mouth daily before breakfast.      . metoprolol succinate (TOPROL-XL) 100 MG 24 hr tablet Take 100 mg by mouth daily.       . pravastatin (PRAVACHOL) 40 MG tablet Take 40 mg by mouth daily.      [name] spironolactone (ALDACTONE) 25 MG tablet Take 25 mg by mouth daily.       Scheduled:  . amLODipine  10 mg Oral Daily  . ampicillin (OMNIPEN) IV  2 g Intravenous Q4H  . benazepril  40 mg Oral BID  . cefTRIAXone (ROCEPHIN)  IV  2 g Intravenous Q12H  . dexamethasone  10 mg Intravenous Q6H  . enoxaparin (LOVENOX) injection  40 mg Subcutaneous Q24H  . insulin aspart  0-20 Units Subcutaneous TID WC  . insulin aspart  0-5 Units Subcutaneous QHS  . insulin aspart  8 Units Subcutaneous TID WC  . insulin glargine  20 Units Subcutaneous Daily  . levothyroxine  250 mcg Oral QAC breakfast  . metoprolol succinate  100 mg Oral Daily  . vancomycin  1,000 mg Intravenous Q8H   Anti-infectives   Start     Dose/Rate Route Frequency Ordered Stop   03/23/13 2200  acyclovir (ZOVIRAX) 940 mg in dextrose 5 % 150  mL IVPB  Status:  Discontinued     10 mg/kg  94.2 kg (Adjusted) 168.8 mL/hr over 60 Minutes Intravenous 3 times per day 03/23/13 2048 03/25/13 1222   03/23/13 1400  vancomycin (VANCOCIN) IVPB 1000 mg/200 mL premix     1,000 mg 200 mL/hr over 60 Minutes Intravenous Every 8 hours 03/23/13 0648     03/23/13 1400  ampicillin (OMNIPEN) 2 g in sodium chloride 0.9 % 50 mL IVPB     2  g 150 mL/hr over 20 Minutes Intravenous 6 times per day 03/23/13 1347     03/23/13 1000  cefTRIAXone (ROCEPHIN) 2 g in dextrose 5 % 50 mL IVPB     2 g 100 mL/hr over 30 Minutes Intravenous Every 12 hours 03/23/13 0630     03/23/13 0700  vancomycin (VANCOCIN) 2,500 mg in sodium chloride 0.9 % 500 mL IVPB     2,500 mg 250 mL/hr over 120 Minutes Intravenous  Once 03/23/13 0648 03/23/13 0932     Assessment: 67yo male had recent Dx of sinusitis and c/o worsening Sx despite amox and Allegra, c/o continued HA w/ fevers and chills, has uncoordinated gait and now confused, concern for meningitis.  He has been receiving IV antibiotics and continues on Ampicillin, Ceftriaxone and IV Vancomycin without noted complications.  His renal function has not changed with with an estimated clearance of ~ 120 ml/min.  His vancomycin trough level reports out at 14.9mcg/ml which is just at goal range of 15-20mcg/ml and indicates appropriate clearance.  Goal of Therapy:  Vancomycin trough level 15-20 mcg/ml Therapeutic response to IV antibiotics  Plan:  1.  Continue IV Vancomycin and all other antibiotics as ordered.   2.  Monitor CBC, Cx, levels prn.  Nadara Mustard, PharmD., MS Clinical Pharmacist Pager:  [phone] Thank you for allowing pharmacy to be part of this patients care team. 03/26/2013,1:42 PM

## 2013-03-27 DIAGNOSIS — R200 Anesthesia of skin: Secondary | ICD-10-CM | POA: Diagnosis present

## 2013-03-27 DIAGNOSIS — R202 Paresthesia of skin: Secondary | ICD-10-CM | POA: Diagnosis present

## 2013-03-27 LAB — CSF CULTURE W GRAM STAIN: Culture: NO GROWTH

## 2013-03-27 LAB — GLUCOSE, CAPILLARY
Glucose-Capillary: 259 mg/dL — ABNORMAL HIGH (ref 70–99)
Glucose-Capillary: 226 mg/dL — ABNORMAL HIGH (ref 70–99)
Glucose-Capillary: 301 mg/dL — ABNORMAL HIGH (ref 70–99)
Glucose-Capillary: 198 mg/dL — ABNORMAL HIGH (ref 70–99)

## 2013-03-27 MED ORDER — INFLUENZA VAC SPLIT QUAD 0.5 ML IM SUSP
0.5000 mL | INTRAMUSCULAR | Status: AC
  Administered 2013-03-28: 0.5 mL via INTRAMUSCULAR
  Filled 2013-03-27: qty 0.5

## 2013-03-27 MED ORDER — INSULIN ASPART 100 UNIT/ML ~~LOC~~ SOLN
12.0000 [IU] | Freq: Three times a day (TID) | SUBCUTANEOUS | Status: DC
  Administered 2013-03-27 – 2013-03-28 (×3): 12 [IU] via SUBCUTANEOUS

## 2013-03-27 MED ORDER — INSULIN GLARGINE 100 UNIT/ML ~~LOC~~ SOLN
30.0000 [IU] | Freq: Every day | SUBCUTANEOUS | Status: DC
  Filled 2013-03-27: qty 0.3

## 2013-03-27 MED ORDER — SODIUM CHLORIDE 0.9 % IJ SOLN
10.0000 mL | INTRAMUSCULAR | Status: DC | PRN
  Administered 2013-03-30: 10 mL

## 2013-03-27 MED ORDER — SODIUM CHLORIDE 0.9 % IJ SOLN
10.0000 mL | Freq: Two times a day (BID) | INTRAMUSCULAR | Status: DC
  Administered 2013-03-28 – 2013-03-29 (×2): 10 mL

## 2013-03-27 NOTE — Progress Notes (Signed)
Pt has home unit and places self on and off.

## 2013-03-27 NOTE — Progress Notes (Signed)
Pt. Has his home CPAP & mask set up at the bedside. Pt. Stated that he would place himself on CPAP before going to bed. CPAP was checked for frayed wires & none were found. Pt. Was made aware to call RT if he needed assistance with CPAP.

## 2013-03-27 NOTE — Progress Notes (Signed)
Peripherally Inserted Central Catheter/Midline Placement  The IV Nurse has discussed with the patient and/or persons authorized to consent for the patient, the purpose of this procedure and the potential benefits and risks involved with this procedure.  The benefits include less needle sticks, lab draws from the catheter and patient may be discharged home with the catheter.  Risks include, but not limited to, infection, bleeding, blood clot (thrombus formation), and puncture of an artery; nerve damage and irregular heat beat.  Alternatives to this procedure were also discussed.  PICC/Midline Placement Documentation  PICC / Midline Single Lumen 03/27/13 PICC Right Basilic (Active)  Indication for Insertion or Continuance of Line Home intravenous therapies (PICC only) 03/27/2013 11:40 AM  Length mark (cm) 0 cm 03/27/2013 11:40 AM  Site Assessment Clean;Dry;Intact 03/27/2013 11:40 AM  Line Status Flushed;Saline locked;Blood return noted 03/27/2013 11:40 AM  Dressing Type Transparent 03/27/2013 11:40 AM  Dressing Status Clean;Dry;Intact;Antimicrobial disc in place 03/27/2013 11:40 AM  Dressing Intervention New dressing 03/27/2013 11:40 AM  Dressing Change Due 04/03/13 03/27/2013 11:40 AM       Ethelda Chick 03/27/2013, 11:52 AM

## 2013-03-27 NOTE — Progress Notes (Signed)
[facility] PROGRESS NOTE  Mason Patton RUE:[id number] DOB: [birth year] DOA: 03/22/2013 PCP: [name], MD  Assessment/Plan: 1. Acute encephalopathy/Meningitis: Aseptic meningitis vs partially treated meningitis. Pt is improving daily. Patient presented with one week of headache, and was on Augumentin from 03/18/13, prescribed by PMD, for possible sinus infection. He became pyrexial/confused on 03/22/13, with temperature as high as 102, associated with nausea, dizziness and ataxia. Wcc was normal. CXR was negative for acute disease, head CT scan showed no acute findings, and urinalysis was negative. The clincal picture is suspicious for acute meningitis/encephalitis. Now on iv Vancomycin/Rocephin/Ampicillin, day #3. LP revealed a pleocytosis of 121, with 2 neutrophils and 83 lymphocytes, Tot Pr 106, Gluc 31. Blood cultures NGTD. Continue supportive treatment. RPR is reactive 1:1, Vitamin B12, folate levels, ordered. Have consulted ID. HSV negative. Off IV acyclovir. Continue current therapy. PICC line being placed today for full 3 weeks of IV antibiotics per ID.  Last day of IV steroids today.  Neuro status is improving.   2. DM/Hyperglycemia: Resolved now. Now pt with hyperglycemia on steroids. Adding basal lantus, increase to 30 units. Titrate as needed. Continue supplemental insulin and increasing mealtime novolog, follow and adjust as needed. Changed IVFs to NS with no dextrose. Patient is a type-2 diabetic and had been on glyburide outpatient. He is not eating well. Continue frequent CBC checks, DC'd oral hypoglycemic agents.   4. Obstructive sleep apnea/morbid obesity: Stable. On CPAP.   5. Hypothyroidism: Continued on thyroxine replacement therapy. TSH is very low, Will lower dose of levothyroxine to 250 mcg.   Code Status: Full Code.  Family Communication: Wife at bedside.  Disposition Plan: Likely home with PICC in 1-2 days  HPI/Subjective: Pt is sitting up in chair today, says  he feels better.   Objective: Filed Vitals:   03/27/13 0538  BP: 131/67  Pulse: 56  Temp: 97.4 F (36.3 C)  Resp: 19    Intake/Output Summary (Last 24 hours) at 03/27/13 1215 Last data filed at 03/26/13 1700  Gross per 24 hour  Intake    240 ml  Output      0 ml  Net    240 ml   Filed Weights   03/23/13 0532  Weight: 126.1 kg (278 lb)    Exam: General: Comfortable, awake, easily arousable, alert, communicative.  HEENT: No clinical pallor, MMM, no jaundice, no conjunctival injection or discharge.  NECK: Supple, JVP not seen, no carotid bruits, no palpable lymphadenopathy, no palpable goiter.  CHEST: Clinically clear to auscultation, no wheezes, no crackles.  HEART: Sounds 1 and 2 heard, normal, regular, no murmurs.  ABDOMEN: Obese, non-tender, no palpable organomegaly, no palpable masses, normal bowel sounds.  LOWER EXTREMITIES: No pitting edema, palpable peripheral pulses.  MUSCULOSKELETAL SYSTEM: Generalized osteoarthritic changes, otherwise, normal.  CENTRAL NERVOUS SYSTEM: No focal neurologic deficit on gross examination.  Data Reviewed: Basic Metabolic Panel:  Recent Labs Lab 03/22/13 2250 03/23/13 0730 03/24/13 0659 03/25/13 0442  NA 136 134* 132* 133*  K 4.0 3.9 3.9 4.4  CL 100 100 97 97  CO2 26 22 23 24   GLUCOSE 54* 79 94 280*  BUN 15 12 7 12   CREATININE 0.90 0.67 0.63 0.77  CALCIUM 10.1 8.8 9.2 9.1   Liver Function Tests:  Recent Labs Lab 03/22/13 2250  AST 16  ALT 18  ALKPHOS 65  BILITOT 0.9  PROT 7.9  ALBUMIN 4.4   No results found for this basename: LIPASE, AMYLASE,  in the last 168 hours No  results found for this basename: AMMONIA,  in the last 168 hours CBC:  Recent Labs Lab 03/22/13 2250 03/23/13 0730 03/24/13 0659 03/25/13 0442  WBC 9.6 8.8 10.2 7.9  HGB 14.2 12.6* 13.8 14.0  HCT 39.5 36.0* 38.5* 38.8*  MCV 85.5 85.5 84.6 83.6  PLT 216 194 205 221   Cardiac Enzymes: No results found for this basename: CKTOTAL, CKMB,  CKMBINDEX, TROPONINI,  in the last 168 hours BNP (last 3 results) No results found for this basename: PROBNP,  in the last 8760 hours CBG:  Recent Labs Lab 03/26/13 1155 03/26/13 1612 03/26/13 2147 03/27/13 0645 03/27/13 1156  GLUCAP 310* 304* 324* 259* 226*    Recent Results (from the past 240 hour(s))  URINE CULTURE     Status: None   Collection Time    03/22/13 11:05 PM      Result Value Range Status   Specimen Description URINE, CLEAN CATCH   Final   Special Requests Normal   Final   Culture  Setup Time     Final   Value: 03/23/2013 00:00     Performed at [facility] Count     Final   Value: NO GROWTH     Performed at [facility]   Culture     Final   Value: NO GROWTH     Performed at [facility]   Report Status 03/24/2013 FINAL   Final  CULTURE, BLOOD (ROUTINE X 2)     Status: None   Collection Time    03/22/13 11:50 PM      Result Value Range Status   Specimen Description BLOOD RIGHT ANTECUBITAL   Final   Special Requests BOTTLES DRAWN AEROBIC ONLY 5CC   Final   Culture  Setup Time     Final   Value: 03/23/2013 04:47     Performed at [facility]   Culture     Final   Value:        BLOOD CULTURE RECEIVED NO GROWTH TO DATE CULTURE WILL BE HELD FOR 5 DAYS BEFORE ISSUING A FINAL NEGATIVE REPORT     Performed at [facility]   Report Status PENDING   Incomplete  CULTURE, BLOOD (ROUTINE X 2)     Status: None   Collection Time    03/23/13 12:13 AM      Result Value Range Status   Specimen Description BLOOD RIGHT HAND   Final   Special Requests BOTTLES DRAWN AEROBIC ONLY 5CC   Final   Culture  Setup Time     Final   Value: 03/23/2013 04:48     Performed at [facility]   Culture     Final   Value:        BLOOD CULTURE RECEIVED NO GROWTH TO DATE CULTURE WILL BE HELD FOR 5 DAYS BEFORE ISSUING A FINAL NEGATIVE REPORT     Performed at [facility]   Report Status PENDING   Incomplete  CSF CULTURE      Status: None   Collection Time    03/23/13 12:10 PM      Result Value Range Status   Specimen Description CSF   Final   Special Requests 2.1ML CSF FLUID   Final   Gram Stain     Final   Value: WBC PRESENT, PREDOMINANTLY PMN     NO ORGANISMS SEEN     Performed at [facility]     Performed  at [facility]     Final   Value: NO GROWTH 2 DAYS     Performed at [facility]   Report Status PENDING   Incomplete  GRAM STAIN     Status: None   Collection Time    03/23/13 12:10 PM      Result Value Range Status   Specimen Description CSF   Final   Special Requests 2.1 ML CSF FLUID   Final   Gram Stain     Final   Value: WBC PRESENT, PREDOMINANTLY MONONUCLEAR     NO ORGANISMS SEEN     CYTOSPIN SLIDE   Report Status 03/23/2013 FINAL   Final     Studies: No results found.  Scheduled Meds: . amLODipine  10 mg Oral Daily  . ampicillin (OMNIPEN) IV  2 g Intravenous Q4H  . benazepril  40 mg Oral BID  . cefTRIAXone (ROCEPHIN)  IV  2 g Intravenous Q12H  . dexamethasone  10 mg Intravenous Q6H  . enoxaparin (LOVENOX) injection  40 mg Subcutaneous Q24H  . insulin aspart  0-20 Units Subcutaneous TID WC  . insulin aspart  0-5 Units Subcutaneous QHS  . insulin aspart  8 Units Subcutaneous TID WC  . insulin glargine  20 Units Subcutaneous Daily  . levothyroxine  250 mcg Oral QAC breakfast  . metoprolol succinate  100 mg Oral Daily  . sodium chloride  10-40 mL Intracatheter Q12H   Continuous Infusions:   Principal Problem:   Meningitis Active Problems:   Hypertension   Thyroid disease   Diabetes mellitus   Dyslipidemia   Encephalopathy acute   Fever   OSA on CPAP   Obesity   Hyperglycemia   [employer] [phone]. If 7PM-7AM, please contact night-coverage at [email or web address], password [facility] 03/27/2013, 12:15 PM  LOS: 5 days

## 2013-03-27 NOTE — Progress Notes (Signed)
Reason for Consult: Right-sided numbness Referring Physician: Dr. Laural Benes  CC: Intermittent right facial and right upper extremity numbness x 2 weeks  HPI: Mason Patton is a 67 y.o. male admitted to [facility] 03/23/2013 with acute altered mental status. The patient had been experiencing headaches for approximately one week. He was treated for a sinus infection by his primary care M.D. When he developed mental status changes his family brought him to the emergency department. An infectious disease consult was obtained and the patient was treated with intravenous antibiotics for suspected bacterial meningitis. He has shown significant improvement.  Recently the patient reported that he been having intermittent right upper extremity and right facial numbness for approximately 2 weeks. Neurology consult was requested for further evaluation. The patient has multiple risk factors for cerebrovascular disease. A CT scan of the head performed 03/23/2013 showed chronic microvascular changes but nothing acute.  The patient feels that his symptoms are becoming more frequent. He estimates that this occurs 10-12 times per day. The duration of the symptoms is becoming longer and he estimates that it lasts for approximately 30 seconds. He denies any associated weakness. The symptoms seem to be precipitated by movement. The patient can identify no relieving factors. There are no other associated symptoms. He feels his symptoms are tolerable; however, he is concerned that there may be something wrong. He takes aspirin 81 mg daily.  Past Medical History  Diagnosis Date  . Hypertension   . Diabetes mellitus   . Thyroid disease   . Diabetes mellitus     Type 2  . Obstructive sleep apnea of adult   . Gallstones and inflammation of gallbladder without obstruction   . Cholecystitis with cholelithiasis   . Arthritis   . Emphysema of lung   . Hyperlipidemia   . Eczema   . Umbilical hernia   . Tobacco abuse     quit  11/08/2012  . Obesity   . History of nuclear stress test 02/23/2010    dipyridamole; normal pattern of perfusion in all regions; normal, low risk study     Past Surgical History  Procedure Laterality Date  . Inguinal hernia repair    . Knee arthroscopy    . Cholecystectomy  09/09/2011    Procedure: LAPAROSCOPIC CHOLECYSTECTOMY WITH INTRAOPERATIVE CHOLANGIOGRAM;  Surgeon: Cherylynn Ridges III, MD;  Location: [facility] OR;  Service: General;  Laterality: N/A;  . Transthoracic echocardiogram  03/28/2005    EF=>55%, mod conc LVH; LA mod dilated & borderline RA enlargement; mild TR; mild pulm valve regurg    Family History  Problem Relation Age of Onset  . Cancer Maternal Aunt     colon  . Cancer Maternal Aunt     lung  . Thyroid disease Mother   . Hyperlipidemia Father   . Diabetes Maternal Grandmother   . Asthma Child   . Diabetes type I Child     Social History:  reports that he quit smoking about 4 months ago. His smoking use included Cigarettes. He has a 25 pack-year smoking history. He has never used smokeless tobacco. He reports that he does not drink alcohol or use illicit drugs.  Allergies  Allergen Reactions  . Codeine Nausea And Vomiting  . Demerol Nausea And Vomiting    Medications:  Scheduled: . amLODipine  10 mg Oral Daily  . ampicillin (OMNIPEN) IV  2 g Intravenous Q4H  . benazepril  40 mg Oral BID  . cefTRIAXone (ROCEPHIN)  IV  2 g Intravenous Q12H  .  dexamethasone  10 mg Intravenous Q6H  . enoxaparin (LOVENOX) injection  40 mg Subcutaneous Q24H  . insulin aspart  0-20 Units Subcutaneous TID WC  . insulin aspart  0-5 Units Subcutaneous QHS  . insulin aspart  12 Units Subcutaneous TID WC  . [START ON 03/28/2013] insulin glargine  30 Units Subcutaneous Daily  . levothyroxine  250 mcg Oral QAC breakfast  . metoprolol succinate  100 mg Oral Daily  . sodium chloride  10-40 mL Intracatheter Q12H    ROS: History obtained from the patient  General ROS: negative for -  chills, fatigue, fever, night sweats, weight gain or weight loss Psychological ROS: negative for - behavioral disorder, hallucinations, memory difficulties, mood swings or suicidal ideation. Positive for recent confusion Ophthalmic ROS: negative for - blurry vision, double vision, eye pain or loss of vision ENT ROS: negative for - epistaxis, nasal discharge, oral lesions, sore throat, tinnitus or vertigo. Positive for recent sinus infection Allergy and Immunology ROS: negative for - hives or itchy/watery eyes Hematological and Lymphatic ROS: negative for - bleeding problems, bruising or swollen lymph nodes Endocrine ROS: negative for - galactorrhea, hair pattern changes, polydipsia/polyuria or temperature intolerance Respiratory ROS: negative for - cough, hemoptysis, positive for dyspnea on exertion and wheezing Cardiovascular ROS: negative for - chest pain, dyspnea on exertion, edema or irregular heartbeat Gastrointestinal ROS: negative for - abdominal pain, diarrhea, hematemesis, nausea/vomiting or stool incontinence Genito-Urinary ROS: negative for - dysuria, hematuria, incontinence or urinary frequency/urgency Musculoskeletal ROS: negative for - positive for degenerative joint disease with TKR planned Neurological ROS: as noted in HPI Dermatological ROS: negative for rash and skin lesion changes   Physical Examination: Blood pressure 149/73, pulse 52, temperature 97.4 F (36.3 C), temperature source Oral, resp. rate 18, height 5\' 10"  (1.778 m), weight 126.1 kg (278 lb), SpO2 96.00%.   General - the pleasant 67 year old male in bed in no acute distress Heart - Regular rate and rhythm - no murmer noted - distant heart sounds Lungs - occasional wheezing Abdomen - Soft - non tender Extremities - Distal pulses intact - 1+ edema bilaterally Skin - Warm and dry  Mental Status: Alert, oriented, thought content appropriate.  Speech fluent without evidence of aphasia.  Able to follow 3 step  commands without difficulty. Cranial Nerves: II: Discs not visualized; Visual fields grossly normal, pupils equal, round, reactive to light and accommodation III,IV, VI: ptosis not present, extra-ocular motions intact bilaterally V,VII: smile symmetric, facial light touch sensation normal bilaterally VIII: hearing normal bilaterally IX,X: gag reflex present XI: bilateral shoulder shrug XII: midline tongue extension Motor: Right : Upper extremity   5/5    Left:     Upper extremity   5/5  Lower extremity   5/5     Lower extremity   5/5 Tone and bulk:normal tone throughout; no atrophy noted Sensory: Light touch intact throughout, bilaterally Deep Tendon Reflexes: 1+ and symmetric throughout Plantars: Right: downgoing   Left: downgoing Cerebellar: normal finger-to-nose, normal rapid alternating movements and normal heel-to-shin test    Laboratory Studies:   Basic Metabolic Panel:  Recent Labs Lab 03/22/13 2250 03/23/13 0730 03/24/13 0659 03/25/13 0442  NA 136 134* 132* 133*  K 4.0 3.9 3.9 4.4  CL 100 100 97 97  CO2 26 22 23 24   GLUCOSE 54* 79 94 280*  BUN 15 12 7 12   CREATININE 0.90 0.67 0.63 0.77  CALCIUM 10.1 8.8 9.2 9.1    Liver Function Tests:  Recent Labs Lab 03/22/13 2250  AST 16  ALT 18  ALKPHOS 65  BILITOT 0.9  PROT 7.9  ALBUMIN 4.4   No results found for this basename: LIPASE, AMYLASE,  in the last 168 hours No results found for this basename: AMMONIA,  in the last 168 hours  CBC:  Recent Labs Lab 03/22/13 2250 03/23/13 0730 03/24/13 0659 03/25/13 0442  WBC 9.6 8.8 10.2 7.9  HGB 14.2 12.6* 13.8 14.0  HCT 39.5 36.0* 38.5* 38.8*  MCV 85.5 85.5 84.6 83.6  PLT 216 194 205 221    Cardiac Enzymes: No results found for this basename: CKTOTAL, CKMB, CKMBINDEX, TROPONINI,  in the last 168 hours  BNP: No components found with this basename: POCBNP,   CBG:  Recent Labs Lab 03/26/13 1155 03/26/13 1612 03/26/13 2147 03/27/13 0645  03/27/13 1156  GLUCAP 310* 304* 324* 259* 226*    Microbiology: Results for orders placed during the hospital encounter of 03/22/13  URINE CULTURE     Status: None   Collection Time    03/22/13 11:05 PM      Result Value Range Status   Specimen Description URINE, CLEAN CATCH   Final   Special Requests Normal   Final   Culture  Setup Time     Final   Value: 03/23/2013 00:00     Performed at [facility] Count     Final   Value: NO GROWTH     Performed at [facility]   Culture     Final   Value: NO GROWTH     Performed at [facility]   Report Status 03/24/2013 FINAL   Final  CULTURE, BLOOD (ROUTINE X 2)     Status: None   Collection Time    03/22/13 11:50 PM      Result Value Range Status   Specimen Description BLOOD RIGHT ANTECUBITAL   Final   Special Requests BOTTLES DRAWN AEROBIC ONLY 5CC   Final   Culture  Setup Time     Final   Value: 03/23/2013 04:47     Performed at [facility]   Culture     Final   Value:        BLOOD CULTURE RECEIVED NO GROWTH TO DATE CULTURE WILL BE HELD FOR 5 DAYS BEFORE ISSUING A FINAL NEGATIVE REPORT     Performed at [facility]   Report Status PENDING   Incomplete  CULTURE, BLOOD (ROUTINE X 2)     Status: None   Collection Time    03/23/13 12:13 AM      Result Value Range Status   Specimen Description BLOOD RIGHT HAND   Final   Special Requests BOTTLES DRAWN AEROBIC ONLY 5CC   Final   Culture  Setup Time     Final   Value: 03/23/2013 04:48     Performed at [facility]   Culture     Final   Value:        BLOOD CULTURE RECEIVED NO GROWTH TO DATE CULTURE WILL BE HELD FOR 5 DAYS BEFORE ISSUING A FINAL NEGATIVE REPORT     Performed at [facility]   Report Status PENDING   Incomplete  CSF CULTURE     Status: None   Collection Time    03/23/13 12:10 PM      Result Value Range Status   Specimen Description CSF   Final   Special Requests 2.1ML CSF FLUID   Final   Gram  Stain     Final   Value: WBC PRESENT, PREDOMINANTLY PMN     NO ORGANISMS SEEN     Performed at [facility]     Performed at [facility]   Culture     Final   Value: NO GROWTH 3 DAYS     Performed at [facility]   Report Status 03/27/2013 FINAL   Final  GRAM STAIN     Status: None   Collection Time    03/23/13 12:10 PM      Result Value Range Status   Specimen Description CSF   Final   Special Requests 2.1 ML CSF FLUID   Final   Gram Stain     Final   Value: WBC PRESENT, PREDOMINANTLY MONONUCLEAR     NO ORGANISMS SEEN     CYTOSPIN SLIDE   Report Status 03/23/2013 FINAL   Final    Coagulation Studies: No results found for this basename: LABPROT, INR,  in the last 72 hours  Urinalysis:   Recent Labs Lab 03/22/13 2304  COLORURINE YELLOW  LABSPEC 1.023  PHURINE 5.5  GLUCOSEU NEGATIVE  HGBUR NEGATIVE  BILIRUBINUR SMALL*  KETONESUR NEGATIVE  PROTEINUR 30*  UROBILINOGEN 2.0*  NITRITE NEGATIVE  LEUKOCYTESUR TRACE*    Lipid Panel:  No results found for this basename: chol,  trig,  hdl,  cholhdl,  vldl,  ldlcalc    HgbA1C:  Lab Results  Component Value Date   HGBA1C 7.3* 09/09/2011    Urine Drug Screen:   No results found for this basename: labopia,  cocainscrnur,  labbenz,  amphetmu,  thcu,  labbarb    Alcohol Level: No results found for this basename: ETH,  in the last 168 hours  Other results: EKG: Normal sinus rhythm rate 75 beats per minute  Imaging:  CT scan of the head 03/22/2013 IMPRESSION:  1. No acute intracranial abnormalities.  2. Mild chronic microvascular ischemic changes in the cerebral white  matter, as above.  Assessment/Plan: 67 year old man admitted for management of acute bacterial meningitis his been experiencing recurrent episodes of focal numbness and tingling. TIAs cannot be ruled out. As well patient may be experiencing recurrent focal seizures.  Recommend TIA workup - MRI/MRA, carotid Dopplers, and a  fasting lipid profile, as well as routine EEG to rule out possible focal seizure activity. Continue aspirin 81 mg per day.  Delton See PA-C [facility] Pager [phone] 03/27/2013, 4:29 PM  I personally participated in this patient's evaluation and management including formulating the above clinical impression and management recommendations.  Venetia Maxon M.D. [facility] [phone]

## 2013-03-28 ENCOUNTER — Inpatient Hospital Stay (HOSPITAL_COMMUNITY): Payer: 59

## 2013-03-28 DIAGNOSIS — R209 Unspecified disturbances of skin sensation: Secondary | ICD-10-CM

## 2013-03-28 LAB — GLUCOSE, CAPILLARY
Glucose-Capillary: 269 mg/dL — ABNORMAL HIGH (ref 70–99)
Glucose-Capillary: 161 mg/dL — ABNORMAL HIGH (ref 70–99)

## 2013-03-28 LAB — BASIC METABOLIC PANEL
BUN: 22 mg/dL (ref 6–23)
CO2: 26 mEq/L (ref 19–32)
Calcium: 9.2 mg/dL (ref 8.4–10.5)
Chloride: 99 mEq/L (ref 96–112)
Creatinine, Ser: 0.59 mg/dL (ref 0.50–1.35)
GFR calc Af Amer: >90 mL/min (ref >90)
GFR calc non Af Amer: >90 mL/min (ref >90)
Potassium: 3.9 mEq/L (ref 3.5–5.1)
Sodium: 136 mEq/L (ref 135–145)

## 2013-03-28 LAB — LIPID PANEL
HDL: 25 mg/dL — ABNORMAL LOW (ref >39)
Total CHOL/HDL Ratio: 5.6 RATIO
Triglycerides: 93 mg/dL (ref <150)

## 2013-03-28 LAB — CBC
HCT: 38.9 % — ABNORMAL LOW (ref 39.0–52.0)
MCHC: 36 g/dL (ref 30.0–36.0)
MCV: 84.7 fL (ref 78.0–100.0)
RBC: 4.59 MIL/uL (ref 4.22–5.81)
RDW: 12.9 % (ref 11.5–15.5)
WBC: 13.1 10*3/uL — ABNORMAL HIGH (ref 4.0–10.5)

## 2013-03-28 MED ORDER — INSULIN GLARGINE 100 UNIT/ML ~~LOC~~ SOLN
35.0000 [IU] | Freq: Every day | SUBCUTANEOUS | Status: DC
  Administered 2013-03-29: 35 [IU] via SUBCUTANEOUS
  Filled 2013-03-28: qty 0.35

## 2013-03-28 MED ORDER — INSULIN ASPART 100 UNIT/ML ~~LOC~~ SOLN
15.0000 [IU] | Freq: Three times a day (TID) | SUBCUTANEOUS | Status: DC
  Administered 2013-03-28 (×2): 15 [IU] via SUBCUTANEOUS

## 2013-03-28 NOTE — Progress Notes (Signed)
[facility] PROGRESS NOTE  Mason Patton YNW:[id number] DOB: 06/15/1946 DOA: 03/22/2013 PCP: [name], MD  Assessment/Plan: 1. Acute encephalopathy/Meningitis: Aseptic meningitis vs partially treated meningitis. Pt is improving daily. Patient presented with one week of headache, and was on Augumentin from 03/18/13, prescribed by PMD, for possible sinus infection. He became pyrexial/confused on 03/22/13, with temperature as high as 102, associated with nausea, dizziness and ataxia. Wcc was normal. CXR was negative for acute disease, head CT scan showed no acute findings, and urinalysis was negative. The clincal picture is suspicious for acute meningitis/encephalitis. Now on iv Vancomycin/Rocephin/Ampicillin, day #3. LP revealed a pleocytosis of 121, with 2 neutrophils and 83 lymphocytes, Tot Pr 106, Gluc 31. Blood cultures NGTD. Continue supportive treatment. RPR is reactive 1:1, Vitamin B12, folate levels, ordered. Have consulted ID. HSV negative. Off IV acyclovir. Continue current therapy. PICC line being placed today for full 3 weeks of IV antibiotics per ID. Last dose of IV steroids should be given later today. Confusion is improving.   2. DM/Hyperglycemia: Resolved now. Now pt with hyperglycemia on steroids. Slight improvement. Steroids should be completed after today.  Adding basal lantus, increased to 35 units. Titrate as needed. Continue supplemental insulin and increasing mealtime novolog, follow and adjust as needed. Changed IVFs to NS with no dextrose. Patient is a type-2 diabetic and had been on glyburide outpatient. He is not eating well. Continue frequent CBC checks, DC'd oral hypoglycemic agents.   4. Obstructive sleep apnea/morbid obesity: Stable. On CPAP.   5. Hypothyroidism: Continued on thyroxine replacement therapy. TSH is very low, Will lower dose of levothyroxine to 250 mcg.   6. Concern for possible TIA - Right arm numbness getting a little worse, appreciate neurology  consult and currently pt is having a TIA workup, continue aspirin daily for now.  Follow results. EEG, MRI/MRA pending, carotid dopplers. Pt had a recent ECHO done and was OK.   Code Status: Full Code.  Family Communication: Wife at bedside.  Disposition Plan: Likely home with PICC in 1-2 days  HPI/Subjective: Pt says that the numbness on the right arm is getting a little worse.    Objective: Filed Vitals:   03/28/13 1041  BP: 150/77  Pulse: 55  Temp: 97.5 F (36.4 C)  Resp: 18    Intake/Output Summary (Last 24 hours) at 03/28/13 1120 Last data filed at 03/27/13 1300  Gross per 24 hour  Intake    240 ml  Output      0 ml  Net    240 ml   Filed Weights   03/23/13 0532  Weight: 126.1 kg (278 lb)    Exam:  General: Comfortable, awake, easily arousable, alert, communicative.  HEENT: No clinical pallor, MMM, no jaundice, no conjunctival injection or discharge.  NECK: Supple, JVP not seen, no carotid bruits, no palpable lymphadenopathy, no palpable goiter.  CHEST: Clinically clear to auscultation, no wheezes, no crackles.  HEART: Sounds 1 and 2 heard, normal, regular, no murmurs.  ABDOMEN: Obese, non-tender, no palpable organomegaly, no palpable masses, normal bowel sounds.  UPPER EXTREMITIES: Slightly weaker grip strength on right, 4-5/5, Left UE 5/5 LOWER EXTREMITIES: No pitting edema, palpable peripheral pulses.  MUSCULOSKELETAL SYSTEM: Generalized osteoarthritic changes.  CENTRAL NERVOUS SYSTEM: moving all extremities and mentating well  Data Reviewed: Basic Metabolic Panel:  Recent Labs Lab 03/22/13 2250 03/23/13 0730 03/24/13 0659 03/25/13 0442 03/28/13 0500  NA 136 134* 132* 133* 136  K 4.0 3.9 3.9 4.4 3.9  CL 100 100 97 97  99  CO2 26 22 23 24 26   GLUCOSE 54* 79 94 280* 274*  BUN 15 12 7 12 22   CREATININE 0.90 0.67 0.63 0.77 0.59  CALCIUM 10.1 8.8 9.2 9.1 9.2   Liver Function Tests:  Recent Labs Lab 03/22/13 2250  AST 16  ALT 18  ALKPHOS 65   BILITOT 0.9  PROT 7.9  ALBUMIN 4.4   No results found for this basename: LIPASE, AMYLASE,  in the last 168 hours No results found for this basename: AMMONIA,  in the last 168 hours CBC:  Recent Labs Lab 03/22/13 2250 03/23/13 0730 03/24/13 0659 03/25/13 0442 03/28/13 0500  WBC 9.6 8.8 10.2 7.9 13.1*  HGB 14.2 12.6* 13.8 14.0 14.0  HCT 39.5 36.0* 38.5* 38.8* 38.9*  MCV 85.5 85.5 84.6 83.6 84.7  PLT 216 194 205 221 252   Cardiac Enzymes: No results found for this basename: CKTOTAL, CKMB, CKMBINDEX, TROPONINI,  in the last 168 hours BNP (last 3 results) No results found for this basename: PROBNP,  in the last 8760 hours CBG:  Recent Labs Lab 03/27/13 0645 03/27/13 1156 03/27/13 1715 03/27/13 2125 03/28/13 0629  GLUCAP 259* 226* 301* 198* 246*    Recent Results (from the past 240 hour(s))  URINE CULTURE     Status: None   Collection Time    03/22/13 11:05 PM      Result Value Range Status   Specimen Description URINE, CLEAN CATCH   Final   Special Requests Normal   Final   Culture  Setup Time     Final   Value: 03/23/2013 00:00     Performed at [facility] Count     Final   Value: NO GROWTH     Performed at [facility]   Culture     Final   Value: NO GROWTH     Performed at [facility]   Report Status 03/24/2013 FINAL   Final  CULTURE, BLOOD (ROUTINE X 2)     Status: None   Collection Time    03/22/13 11:50 PM      Result Value Range Status   Specimen Description BLOOD RIGHT ANTECUBITAL   Final   Special Requests BOTTLES DRAWN AEROBIC ONLY 5CC   Final   Culture  Setup Time     Final   Value: 03/23/2013 04:47     Performed at [facility]   Culture     Final   Value:        BLOOD CULTURE RECEIVED NO GROWTH TO DATE CULTURE WILL BE HELD FOR 5 DAYS BEFORE ISSUING A FINAL NEGATIVE REPORT     Performed at [facility]   Report Status PENDING   Incomplete  CULTURE, BLOOD (ROUTINE X 2)     Status: None    Collection Time    03/23/13 12:13 AM      Result Value Range Status   Specimen Description BLOOD RIGHT HAND   Final   Special Requests BOTTLES DRAWN AEROBIC ONLY 5CC   Final   Culture  Setup Time     Final   Value: 03/23/2013 04:48     Performed at [facility]   Culture     Final   Value:        BLOOD CULTURE RECEIVED NO GROWTH TO DATE CULTURE WILL BE HELD FOR 5 DAYS BEFORE ISSUING A FINAL NEGATIVE REPORT     Performed at [facility]  Report Status PENDING   Incomplete  CSF CULTURE     Status: None   Collection Time    03/23/13 12:10 PM      Result Value Range Status   Specimen Description CSF   Final   Special Requests 2.1ML CSF FLUID   Final   Gram Stain     Final   Value: WBC PRESENT, PREDOMINANTLY PMN     NO ORGANISMS SEEN     Performed at [facility]     Performed at [facility]   Culture     Final   Value: NO GROWTH 3 DAYS     Performed at [facility]   Report Status 03/27/2013 FINAL   Final  GRAM STAIN     Status: None   Collection Time    03/23/13 12:10 PM      Result Value Range Status   Specimen Description CSF   Final   Special Requests 2.1 ML CSF FLUID   Final   Gram Stain     Final   Value: WBC PRESENT, PREDOMINANTLY MONONUCLEAR     NO ORGANISMS SEEN     CYTOSPIN SLIDE   Report Status 03/23/2013 FINAL   Final     Studies: No results found.  Scheduled Meds: . amLODipine  10 mg Oral Daily  . ampicillin (OMNIPEN) IV  2 g Intravenous Q4H  . benazepril  40 mg Oral BID  . cefTRIAXone (ROCEPHIN)  IV  2 g Intravenous Q12H  . dexamethasone  10 mg Intravenous Q6H  . enoxaparin (LOVENOX) injection  40 mg Subcutaneous Q24H  . influenza vac split quadrivalent PF  0.5 mL Intramuscular Tomorrow-1000  . insulin aspart  0-20 Units Subcutaneous TID WC  . insulin aspart  0-5 Units Subcutaneous QHS  . insulin aspart  12 Units Subcutaneous TID WC  . insulin glargine  30 Units Subcutaneous Daily  . levothyroxine  250 mcg Oral  QAC breakfast  . metoprolol succinate  100 mg Oral Daily  . sodium chloride  10-40 mL Intracatheter Q12H   Continuous Infusions:   Principal Problem:   Meningitis Active Problems:   Hypertension   Thyroid disease   Diabetes mellitus   Dyslipidemia   Encephalopathy acute   Fever   OSA on CPAP   Obesity   Hyperglycemia   Numbness and tingling   Terie [employer] [phone]. If 7PM-7AM, please contact night-coverage at [email or web address], password [facility] 03/28/2013, 11:20 AM  LOS: 6 days

## 2013-03-29 ENCOUNTER — Inpatient Hospital Stay (HOSPITAL_COMMUNITY): Payer: 59

## 2013-03-29 DIAGNOSIS — I639 Cerebral infarction, unspecified: Secondary | ICD-10-CM | POA: Diagnosis not present

## 2013-03-29 DIAGNOSIS — I6350 Cerebral infarction due to unspecified occlusion or stenosis of unspecified cerebral artery: Secondary | ICD-10-CM

## 2013-03-29 LAB — CULTURE, BLOOD (ROUTINE X 2): Culture: NO GROWTH

## 2013-03-29 LAB — GLUCOSE, CAPILLARY
Glucose-Capillary: 195 mg/dL — ABNORMAL HIGH (ref 70–99)
Glucose-Capillary: 268 mg/dL — ABNORMAL HIGH (ref 70–99)
Glucose-Capillary: 265 mg/dL — ABNORMAL HIGH (ref 70–99)

## 2013-03-29 LAB — ENTEROVIRUS PCR: Enterovirus PCR: NOT DETECTED

## 2013-03-29 MED ORDER — INSULIN GLARGINE 100 UNIT/ML ~~LOC~~ SOLN
25.0000 [IU] | Freq: Every day | SUBCUTANEOUS | Status: DC
  Filled 2013-03-29: qty 0.25

## 2013-03-29 MED ORDER — INSULIN ASPART 100 UNIT/ML ~~LOC~~ SOLN
10.0000 [IU] | Freq: Three times a day (TID) | SUBCUTANEOUS | Status: DC
  Administered 2013-03-29: 10 [IU] via SUBCUTANEOUS

## 2013-03-29 NOTE — Progress Notes (Signed)
Pt. Is wearing home CPAP unit at this time.

## 2013-03-29 NOTE — Progress Notes (Signed)
Pt. Is wearing home CPAP unit at this time and resting comfortably.  Wife at bedside.

## 2013-03-29 NOTE — Progress Notes (Signed)
Subjective: Patient continues to intermittently have brief episodes of numbness involving right side of his face and right extremities. Patient is also brief pain with numbness.  Objective: Current vital signs: BP 135/59  Pulse 48  Temp(Src) 97.2 F (36.2 C) (Oral)  Resp 18  Ht 5\' 10"  (1.778 m)  Wt 126.1 kg (278 lb)  BMI 39.89 kg/m2  SpO2 100%  Neurologic Exam: Alert and in no acute distress. Mental status was normal. Speech was normal. No facial weakness was noted. Patient moved extremities equally with normal strength throughout.  MRI of the brain on 03/28/2013 showed findings consistent with acute small ischemic stroke involving the left frontal region.  Echocardiogram was unremarkable.  Carotid Doppler preliminary report indicated no high-grade carotid artery stenosis on either side.  Fasting lipid panel was unremarkable except for low HDL. Patient is currently on protocol.  Medications: I have reviewed the patient's current medications.  EEG on 03/29/2013 was normal. No signs of an epileptic disorder were recorded.  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 Symptoms are brief and did not require specific treatment intervention.  Recommend discontinuing aspirin and starting Plavix 75 mg per day for antiplatelet therapy.  No further neurodiagnostic studies are indicated.  C.R. Roseanne Reno, MD [facility] [phone]  03/29/2013  5:43 PM

## 2013-03-29 NOTE — Procedures (Signed)
ELECTROENCEPHALOGRAM REPORT   Patient: Mason Patton       Room #: 4U98 EEG No. ID: [id number] Age: 67 y.o.        Sex: male Referring Physician: Maryln Manuel Report Date:  03/29/2013        Interpreting Physician: Aline Brochure  History: Mason Patton is an 67 y.o. male admitted for management of acute arterial meningitis and has been experiencing recurrent episodes of numbness and tingling involving his right face and upper extremity.  Indications for study:  Rule out focal seizure disorder.  Technique: This is an 18 channel routine scalp EEG performed at the bedside with bipolar and monopolar montages arranged in accordance to the international 10/20 system of electrode placement.   Description: CT recording was performed during wakefulness. Background activity consisted of low to irregular 1-2 Hz diffuse delta activity with superimposed 7 Hz theta activity which was most prominent posteriorly. Photic stimulation produced a symmetrical occipital driving response. Hyperventilation was not performed. No epileptiform discharges were recorded.  Interpretation: CT is abnormal with mild nonspecific continuous generalized slowing of cerebral activity. No evidence of an epileptic disorder is demonstrated.  Venetia Maxon M.D. [facility] [phone]

## 2013-03-29 NOTE — Progress Notes (Signed)
ANTIBIOTIC CONSULT NOTE - Follow Up  Pharmacy Consult for Rocephin/Ampicillin Indication: r/o meningitis  Allergies  Allergen Reactions  . Codeine Nausea And Vomiting  . Demerol Nausea And Vomiting   Patient Measurements: Height: 5\' 10"  (177.8 cm) Weight: 278 lb (126.1 kg) IBW/kg (Calculated) : 73  Vital Signs: Temp: 97.3 F (36.3 C) (09/22 1003) Temp src: Oral (09/22 1003) BP: 138/62 mmHg (09/22 1003) Pulse Rate: 56 (09/22 1003)  Labs:  Recent Labs  03/28/13 0500  WBC 13.1*  HGB 14.0  PLT 252  CREATININE 0.59   Estimated Creatinine Clearance: 119.4 ml/min (by C-G formula based on Cr of 0.59).   Microbiology: Recent Results (from the past 720 hour(s))  URINE CULTURE     Status: None   Collection Time    03/22/13 11:05 PM      Result Value Range Status   Specimen Description URINE, CLEAN CATCH   Final   Special Requests Normal   Final   Culture  Setup Time     Final   Value: 03/23/2013 00:00     Performed at [facility] Count     Final   Value: NO GROWTH     Performed at [facility]   Culture     Final   Value: NO GROWTH     Performed at [facility]   Report Status 03/24/2013 FINAL   Final  CULTURE, BLOOD (ROUTINE X 2)     Status: None   Collection Time    03/22/13 11:50 PM      Result Value Range Status   Specimen Description BLOOD RIGHT ANTECUBITAL   Final   Special Requests BOTTLES DRAWN AEROBIC ONLY 5CC   Final   Culture  Setup Time     Final   Value: 03/23/2013 04:47     Performed at [facility]   Culture     Final   Value: NO GROWTH 5 DAYS     Performed at [facility]   Report Status 03/29/2013 FINAL   Final  CULTURE, BLOOD (ROUTINE X 2)     Status: None   Collection Time    03/23/13 12:13 AM      Result Value Range Status   Specimen Description BLOOD RIGHT HAND   Final   Special Requests BOTTLES DRAWN AEROBIC ONLY 5CC   Final   Culture  Setup Time     Final   Value: 03/23/2013 04:48      Performed at [facility]   Culture     Final   Value: NO GROWTH 5 DAYS     Performed at [facility]   Report Status 03/29/2013 FINAL   Final  CSF CULTURE     Status: None   Collection Time    03/23/13 12:10 PM      Result Value Range Status   Specimen Description CSF   Final   Special Requests 2.1ML CSF FLUID   Final   Gram Stain     Final   Value: WBC PRESENT, PREDOMINANTLY PMN     NO ORGANISMS SEEN     Performed at [facility]     Performed at [facility]   Culture     Final   Value: NO GROWTH 3 DAYS     Performed at [facility]   Report Status 03/27/2013 FINAL   Final  GRAM STAIN     Status: None   Collection Time  03/23/13 12:10 PM      Result Value Range Status   Specimen Description CSF   Final   Special Requests 2.1 ML CSF FLUID   Final   Gram Stain     Final   Value: WBC PRESENT, PREDOMINANTLY MONONUCLEAR     NO ORGANISMS SEEN     CYTOSPIN SLIDE   Report Status 03/23/2013 FINAL   Final    Anti-infectives   Start     Dose/Rate Route Frequency Ordered Stop   03/23/13 2200  acyclovir (ZOVIRAX) 940 mg in dextrose 5 % 150 mL IVPB  Status:  Discontinued     10 mg/kg  94.2 kg (Adjusted) 168.8 mL/hr over 60 Minutes Intravenous 3 times per day 03/23/13 2048 03/25/13 1222   03/23/13 1400  vancomycin (VANCOCIN) IVPB 1000 mg/200 mL premix  Status:  Discontinued     1,000 mg 200 mL/hr over 60 Minutes Intravenous Every 8 hours 03/23/13 0648 03/26/13 1718   03/23/13 1400  ampicillin (OMNIPEN) 2 g in sodium chloride 0.9 % 50 mL IVPB     2 g 150 mL/hr over 20 Minutes Intravenous 6 times per day 03/23/13 1347     03/23/13 1000  cefTRIAXone (ROCEPHIN) 2 g in dextrose 5 % 50 mL IVPB     2 g 100 mL/hr over 30 Minutes Intravenous Every 12 hours 03/23/13 0630     03/23/13 0700  vancomycin (VANCOCIN) 2,500 mg in sodium chloride 0.9 % 500 mL IVPB     2,500 mg 250 mL/hr over 120 Minutes Intravenous  Once 03/23/13 0648 03/23/13 0932      Assessment: 67yo male had recent dx of sinusitis and c/o worsening sx despite treatment- admitted and d/t AMS, concern for meningitis. Continues on Ampicillin, Ceftriaxone- steroid course was completed yesterday evening. His renal function is stable. Estimated CrCl is >172mL/min. No growth on cultures. No seizure activity noted on EEG.  Goal of Therapy:  Therapeutic response to IV antibiotics  Plan:  1. Continue ampicillin 2g IV Q4h 2. Continue ceftriaxone 2gm IV q12h 3. Follow renal function, any other cultures/sensitivies, LOT  Mason Patton, PharmD Clinical Pharmacist Pager: [phone] 03/29/2013 2:45 PM

## 2013-03-29 NOTE — Progress Notes (Signed)
Chaplain offered ministry of presence and emotional support to the patient and his family.   03/29/13 1300  Clinical Encounter Type  Visited With Patient and family together  Visit Type Initial;Social support  Referral From Chaplain  Spiritual Encounters  Spiritual Needs Emotional  Stress Factors  Patient Stress Factors None identified  Family Stress Factors None identified

## 2013-03-29 NOTE — Progress Notes (Signed)
Routine EEG completed.  

## 2013-03-29 NOTE — Care Management Note (Unsigned)
    Page 1 of 1   03/29/2013     4:21:23 PM   CARE MANAGEMENT NOTE 03/29/2013  Patient:  Mason Patton, Mason Patton   Account Number:  [id number]  Date Initiated:  03/26/2013  Documentation initiated by:  Elmer Bales  Subjective/Objective Assessment:   Patient admitted for meningitis     Action/Plan:   will follow for discharge needs.   Anticipated DC Date:     Anticipated DC Plan:  HOME/SELF CARE         Choice offered to / List presented to:  C-1 Patient        HH arranged  HH-1 RN  IV Antibiotics      HH agency  [facility]   Status of service:   Medicare Important Message given?   (If response is "NO", the following Medicare IM given date fields will be blank) Date Medicare IM given:   Date Additional Medicare IM given:    Discharge Disposition:    Per UR Regulation:    If discussed at Long Length of Stay Meetings, dates discussed:    Comments:  03/29/13 1600 Elmer Bales RN, MSN, CM- Met with patient and family to discuss home health for IV antibiotic therapy. Patient and family have chosen [facility]. Mary with [facility] was notified and has accepted the referral.

## 2013-03-29 NOTE — Progress Notes (Signed)
*  PRELIMINARY RESULTS* Vascular Ultrasound Carotid Duplex (Doppler) has been completed.  Preliminary findings: Right = 40-59% ICA stenosis. Left = 1-39% ICA stenosis. Antegrade vertebral flow. Due to calcific plaque with acoustic shadowing, higher velocities may be obscured.   Farrel Demark, RDMS, RVT  03/29/2013, 4:05 PM

## 2013-03-29 NOTE — Progress Notes (Signed)
[facility] PROGRESS NOTE  SHRAY HUNLEY ZOX:[id number] DOB: 03/13/1946 DOA: 03/22/2013 PCP: [name], MD  Assessment/Plan: 1. Acute encephalopathy/Meningitis: Aseptic meningitis vs partially treated meningitis. Pt is improving daily. Patient presented with one week of headache, and was on Augumentin from 03/18/13, prescribed by PMD, for possible sinus infection. He became pyrexial/confused on 03/22/13, with temperature as high as 102, associated with nausea, dizziness and ataxia. Wcc was normal. CXR was negative for acute disease, head CT scan showed no acute findings, and urinalysis was negative. The clincal picture is suspicious for acute meningitis/encephalitis. Now on iv Vancomycin/Rocephin/Ampicillin, day #3. LP revealed a pleocytosis of 121, with 2 neutrophils and 83 lymphocytes, Tot Pr 106, Gluc 31. Blood cultures NGTD. Continue supportive treatment. RPR is reactive 1:1, Vitamin B12, folate levels, ordered. Have consulted ID. HSV negative. Off IV acyclovir. Continue current therapy. PICC line being placed today for full 3 weeks of IV antibiotics per ID. Last dose of IV steroids should be given. Confusion is improved.  Pt is back to baseline mental status.    2. DM/Hyperglycemia: BS improving now that pt is off steroids.  Will start cutting back on insulin doses.  Continue supplemental insulin, follow and adjust as needed.  Patient is a type-2 diabetic and had been on glyburide outpatient. Continue frequent CBC checks, DC'd oral hypoglycemic agents.   4. Obstructive sleep apnea/morbid obesity: Stable. On CPAP.   5. Hypothyroidism: Continued on thyroxine replacement therapy. TSH is very low, Will lower dose of levothyroxine to 250 mcg.   6. 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  Pt on aspirin, ask neuro about plavix.  EEG no seizure activity, carotid dopplers 40-59% ICA stenosis.  Left = 1-39% ICA stenosis . Pt had a recent ECHO done and was OK.   Code Status: Full Code.  Family Communication: Wife at bedside.  Disposition Plan:  Home with PICC when stable   HPI/Subjective: Pt reports that he is still having numbness of left arm.  This is a  Objective: Filed Vitals:   03/29/13 1443  BP: 135/59  Pulse: 48  Temp: 97.2 F (36.2 C)  Resp: 18    Intake/Output Summary (Last 24 hours) at 03/29/13 1627 Last data filed at 03/28/13 1700  Gross per 24 hour  Intake    150 ml  Output      0 ml  Net    150 ml   Filed Weights   03/23/13 0532  Weight: 126.1 kg (278 lb)    Exam:  General: Comfortable, awake, easily arousable, alert, communicative.  HEENT: No clinical pallor, MMM, no jaundice, no conjunctival injection or discharge.  NECK: Supple, JVP not seen, no carotid bruits, no palpable lymphadenopathy, no palpable goiter.  CHEST: Clinically clear to auscultation, no wheezes, no crackles.  HEART: Sounds 1 and 2 heard, normal, regular, no murmurs.  ABDOMEN: Obese, non-tender, no palpable organomegaly, no palpable masses, normal bowel sounds.  UPPER EXTREMITIES: improved grip strength on right, 4-5/5, Left UE 5/5  LOWER EXTREMITIES: No pitting edema, palpable peripheral pulses.  MUSCULOSKELETAL SYSTEM: Generalized osteoarthritic changes.  CENTRAL NERVOUS SYSTEM: moving all extremities and mentating well  Data Reviewed: Basic Metabolic Panel:  Recent Labs Lab 03/22/13 2250 03/23/13 0730 03/24/13 0659 03/25/13 0442 03/28/13 0500  NA 136 134* 132* 133* 136  K 4.0 3.9 3.9 4.4 3.9  CL 100 100 97 97 99  CO2 26 22 23  24  26  GLUCOSE 54* 79 94 280* 274*  BUN 15 12 7 12 22   CREATININE 0.90 0.67 0.63 0.77 0.59  CALCIUM 10.1 8.8 9.2 9.1 9.2   Liver Function Tests:  Recent Labs Lab 03/22/13 2250  AST 16  ALT 18  ALKPHOS 65  BILITOT 0.9  PROT 7.9  ALBUMIN 4.4   No results found for this basename: LIPASE, AMYLASE,  in the last 168 hours No  results found for this basename: AMMONIA,  in the last 168 hours CBC:  Recent Labs Lab 03/22/13 2250 03/23/13 0730 03/24/13 0659 03/25/13 0442 03/28/13 0500  WBC 9.6 8.8 10.2 7.9 13.1*  HGB 14.2 12.6* 13.8 14.0 14.0  HCT 39.5 36.0* 38.5* 38.8* 38.9*  MCV 85.5 85.5 84.6 83.6 84.7  PLT 216 194 205 221 252   Cardiac Enzymes: No results found for this basename: CKTOTAL, CKMB, CKMBINDEX, TROPONINI,  in the last 168 hours BNP (last 3 results) No results found for this basename: PROBNP,  in the last 8760 hours CBG:  Recent Labs Lab 03/28/13 1129 03/28/13 1644 03/28/13 2110 03/29/13 0643 03/29/13 1155  GLUCAP 269* 161* 145* 195* 268*    Recent Results (from the past 240 hour(s))  URINE CULTURE     Status: None   Collection Time    03/22/13 11:05 PM      Result Value Range Status   Specimen Description URINE, CLEAN CATCH   Final   Special Requests Normal   Final   Culture  Setup Time     Final   Value: 03/23/2013 00:00     Performed at [facility] Count     Final   Value: NO GROWTH     Performed at [facility]   Culture     Final   Value: NO GROWTH     Performed at [facility]   Report Status 03/24/2013 FINAL   Final  CULTURE, BLOOD (ROUTINE X 2)     Status: None   Collection Time    03/22/13 11:50 PM      Result Value Range Status   Specimen Description BLOOD RIGHT ANTECUBITAL   Final   Special Requests BOTTLES DRAWN AEROBIC ONLY 5CC   Final   Culture  Setup Time     Final   Value: 03/23/2013 04:47     Performed at [facility]   Culture     Final   Value: NO GROWTH 5 DAYS     Performed at [facility]   Report Status 03/29/2013 FINAL   Final  CULTURE, BLOOD (ROUTINE X 2)     Status: None   Collection Time    03/23/13 12:13 AM      Result Value Range Status   Specimen Description BLOOD RIGHT HAND   Final   Special Requests BOTTLES DRAWN AEROBIC ONLY 5CC   Final   Culture  Setup Time     Final   Value:  03/23/2013 04:48     Performed at [facility]   Culture     Final   Value: NO GROWTH 5 DAYS     Performed at [facility]   Report Status 03/29/2013 FINAL   Final  CSF CULTURE     Status: None   Collection Time    03/23/13 12:10 PM      Result Value Range Status   Specimen Description CSF   Final   Special Requests 2.1ML CSF FLUID  Final   Gram Stain     Final   Value: WBC PRESENT, PREDOMINANTLY PMN     NO ORGANISMS SEEN     Performed at [facility]     Performed at [facility]   Culture     Final   Value: NO GROWTH 3 DAYS     Performed at [facility]   Report Status 03/27/2013 FINAL   Final  GRAM STAIN     Status: None   Collection Time    03/23/13 12:10 PM      Result Value Range Status   Specimen Description CSF   Final   Special Requests 2.1 ML CSF FLUID   Final   Gram Stain     Final   Value: WBC PRESENT, PREDOMINANTLY MONONUCLEAR     NO ORGANISMS SEEN     CYTOSPIN SLIDE   Report Status 03/23/2013 FINAL   Final     Studies: Mr Maxine Glenn Head Wo Contrast  03/28/2013   CLINICAL DATA:  Stroke. Confusion  EXAM: MRI HEAD WITHOUT CONTRAST  MRA HEAD WITHOUT CONTRAST  TECHNIQUE: Multiplanar, multiecho pulse sequences of the brain and surrounding structures were obtained without intravenous contrast. Angiographic images of the head were obtained using MRA technique without contrast.  COMPARISON:  CT head March 23, 2013  FINDINGS: MRI HEAD FINDINGS  Well defined hyperintensities in the right periventricular white matter, right external capsule, and right occipital lobe. These are compatible with chronic infarct and show T2 shine through on diffusion-weighted imaging. Mild chronic microvascular ischemic change in the periventricular white matter.  3 mm area of restricted diffusion in the left frontal white matter consistent with acute infarct.  Mild atrophy. Negative for hydrocephalus. Negative for hemorrhage or mass lesion.  MRA HEAD FINDINGS   Both vertebral arteries are patent to the basilar. PICA, AICA, superior cerebellar arteries are widely patent. Fetal origin left posterior cerebral artery. Both posterior cerebral arteries are patent.  Mild atherosclerotic irregularity in the cavernous carotid bilaterally without stenosis. Anterior and middle cerebral arteries are patent without stenosis. Negative for aneurysm.  IMPRESSION: MRI HEAD IMPRESSION  Atrophy and chronic ischemic changes.  3 mm acute infarct in the left frontal white matter.  MRA HEAD IMPRESSION  No significant intracranial stenosis.   Electronically Signed   By: Marlan Palau M.D.   On: 03/28/2013 13:47   Mr Brain Wo Contrast  03/28/2013   CLINICAL DATA:  Stroke. Confusion  EXAM: MRI HEAD WITHOUT CONTRAST  MRA HEAD WITHOUT CONTRAST  TECHNIQUE: Multiplanar, multiecho pulse sequences of the brain and surrounding structures were obtained without intravenous contrast. Angiographic images of the head were obtained using MRA technique without contrast.  COMPARISON:  CT head March 23, 2013  FINDINGS: MRI HEAD FINDINGS  Well defined hyperintensities in the right periventricular white matter, right external capsule, and right occipital lobe. These are compatible with chronic infarct and show T2 shine through on diffusion-weighted imaging. Mild chronic microvascular ischemic change in the periventricular white matter.  3 mm area of restricted diffusion in the left frontal white matter consistent with acute infarct.  Mild atrophy. Negative for hydrocephalus. Negative for hemorrhage or mass lesion.  MRA HEAD FINDINGS  Both vertebral arteries are patent to the basilar. PICA, AICA, superior cerebellar arteries are widely patent. Fetal origin left posterior cerebral artery. Both posterior cerebral arteries are patent.  Mild atherosclerotic irregularity in the cavernous carotid bilaterally without stenosis. Anterior and middle cerebral arteries are patent without stenosis. Negative for aneurysm.  IMPRESSION: MRI HEAD IMPRESSION  Atrophy and chronic ischemic changes.  3 mm acute infarct in the left frontal white matter.  MRA HEAD IMPRESSION  No significant intracranial stenosis.   Electronically Signed   By: Marlan Palau M.D.   On: 03/28/2013 13:47    Scheduled Meds: . amLODipine  10 mg Oral Daily  . ampicillin (OMNIPEN) IV  2 g Intravenous Q4H  . benazepril  40 mg Oral BID  . cefTRIAXone (ROCEPHIN)  IV  2 g Intravenous Q12H  . enoxaparin (LOVENOX) injection  40 mg Subcutaneous Q24H  . insulin aspart  0-20 Units Subcutaneous TID WC  . insulin aspart  0-5 Units Subcutaneous QHS  . insulin aspart  10 Units Subcutaneous TID WC  . [START ON 03/30/2013] insulin glargine  25 Units Subcutaneous Daily  . levothyroxine  250 mcg Oral QAC breakfast  . metoprolol succinate  100 mg Oral Daily  . sodium chloride  10-40 mL Intracatheter Q12H   Continuous Infusions:   Principal Problem:   Meningitis Active Problems:   Hypertension   Thyroid disease   Diabetes mellitus   Dyslipidemia   Encephalopathy acute   Fever   OSA on CPAP   Obesity   Hyperglycemia   Numbness and tingling   [employer] [phone]. If 7PM-7AM, please contact night-coverage at [email or web address], password [facility] 03/29/2013, 4:27 PM  LOS: 7 days

## 2013-03-30 ENCOUNTER — Encounter: Payer: Self-pay | Admitting: Internal Medicine

## 2013-03-30 DIAGNOSIS — G0490 Encephalitis and encephalomyelitis, unspecified: Secondary | ICD-10-CM | POA: Diagnosis present

## 2013-03-30 LAB — BASIC METABOLIC PANEL
BUN: 22 mg/dL (ref 6–23)
Calcium: 8 mg/dL — ABNORMAL LOW (ref 8.4–10.5)
Chloride: 106 mEq/L (ref 96–112)
GFR calc Af Amer: >90 mL/min (ref >90)
Glucose, Bld: 82 mg/dL (ref 70–99)
Potassium: 3.6 mEq/L (ref 3.5–5.1)
Sodium: 143 mEq/L (ref 135–145)

## 2013-03-30 LAB — GLUCOSE, CAPILLARY: Glucose-Capillary: 132 mg/dL — ABNORMAL HIGH (ref 70–99)

## 2013-03-30 MED ORDER — HEPARIN SOD (PORK) LOCK FLUSH 100 UNIT/ML IV SOLN
250.0000 [IU] | INTRAVENOUS | Status: AC | PRN
  Administered 2013-03-30: 250 [IU]

## 2013-03-30 MED ORDER — DEXTROSE 5 % IV SOLN: 2.0000 g | Freq: Two times a day (BID) | INTRAVENOUS | Status: AC

## 2013-03-30 MED ORDER — LEVOTHYROXINE SODIUM 125 MCG PO TABS: 250.0000 ug | ORAL_TABLET | Freq: Every day | ORAL | Status: DC

## 2013-03-30 MED ORDER — CLOPIDOGREL BISULFATE 75 MG PO TABS: 75.0000 mg | ORAL_TABLET | Freq: Every day | ORAL | Status: DC

## 2013-03-30 MED ORDER — ATORVASTATIN CALCIUM 40 MG PO TABS: 40.0000 mg | ORAL_TABLET | Freq: Every day | ORAL | Status: DC

## 2013-03-30 MED ORDER — SODIUM CHLORIDE 0.9 % IV SOLN: 2.0000 g | INTRAVENOUS | Status: AC

## 2013-03-30 NOTE — Evaluation (Signed)
Physical Therapy Evaluation Patient Details Name: Mason Patton MRN: [id number] DOB: 1945/09/24 Today's Date: 03/30/2013 Time: 4098-1191 PT Time Calculation (min): 21 min  PT Assessment / Plan / Recommendation History of Present Illness  pt adm with acute confusion and found to have meningitis. Also found to have an acute infarct Lt frontal lobe with associated Lt sides numbness/weakness.  Clinical Impression  Pt adm with above diagnosis. 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 Currently he is functional at a basic, home environment level, however will likely need work hardening/OPPT for balance prior to return to work. He reports his father is hospitalized and not expected to survive, so he is not interested in setting up OPPT at this time. He prefers to discuss with MD on his follow-up appointment. No further acute PT needs at this time.     PT Assessment  All further PT needs can be met in the next venue of care    Follow Up Recommendations  Outpatient PT--however not ready to begin yet (see above)     Does the patient have the potential to tolerate intense rehabilitation      Barriers to Discharge        Equipment Recommendations  None recommended by PT    Recommendations for Other Services     Frequency      Precautions / Restrictions     Pertinent Vitals/Pain Denied pain      Mobility  Bed Mobility Bed Mobility: Supine to Sit;Sitting - Scoot to [place] of Bed;Sit to Supine Supine to Sit: 7: Independent Sitting - Scoot to Edge of Bed: 7: Independent Sit to Supine: 7: Independent Details for Bed Mobility Assistance: no cues needed Transfers Transfers: Sit to Stand;Stand to Sit Sit to Stand: 5: Supervision Stand to Sit: 5: Supervision Details for Transfer Assistance: wide base of support; supervision for safety although no loss of balance Ambulation/Gait Ambulation/Gait Assistance: 5: Supervision;7:  Independent Ambulation Distance (Feet): 100 Feet Assistive device: None Ambulation/Gait Assistance Details: slower velocity; slightly wide base of support; no listing or veering off path Gait Pattern: Within Functional Limits Stairs: No Modified Rankin (Stroke Patients Only) Pre-Morbid Rankin Score: No symptoms Modified Rankin: Slight disability    Exercises     PT Diagnosis: Difficulty walking  PT Problem List: Decreased balance PT Treatment Interventions:       PT Goals(Current goals can be found in the care plan section) Acute Rehab PT Goals PT Goal Formulation: No goals set, d/c therapy  Visit Information  Last PT Received On: 03/30/13 Assistance Needed: +1 History of Present Illness: pt adm with acute confusion and found to have meningitis. Also found to have an acute infarct Lt frontal lobe with associated Lt sides numbness/weakness.       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[employer], Armed forces operational officer Communication: No difficulties    Cognition  Cognition Arousal/Alertness: Awake/alert Behavior During Therapy: WFL for tasks assessed/performed Overall Cognitive Status: Within Functional Limits for tasks assessed    Extremity/Trunk Assessment Upper Extremity Assessment Upper Extremity Assessment: Defer to OT evaluation Lower Extremity Assessment Lower Extremity Assessment: Overall WFL for tasks assessed (5/5  strength; sensation intact to light touch) Cervical / Trunk Assessment Cervical / Trunk Assessment: Normal   Balance  Balance Balance Assessed: Yes Static Standing Balance Static Standing - Balance Support: No upper extremity supported Static Standing - Level of Assistance: 7: Independent Single Leg Stance - Right Leg: 16 Single Leg Stance - Left Leg: 7 Tandem Stance - Right Leg: 30 [place] - Eyes Opened: 30 [place] - Eyes Closed: 30  End of Session PT - End of Session Equipment Utilized During Treatment: Gait belt Activity Tolerance: Patient tolerated treatment well Patient left: in bed;with call bell/phone within reach  GP     Radha Coggins 03/30/2013, 12:08 PM Pager [phone]

## 2013-03-30 NOTE — Discharge Summary (Signed)
Physician Discharge Summary  Mason Patton:[id number] DOB: Nov 26, 1945 DOA: 03/22/2013  PCP: [name], MD  Admit date: 03/22/2013 Discharge date: 03/30/2013  Recommendations for Outpatient Follow-up:  1. Monitor Blood sugar 2. Please recheck TSH when pt's current illness has been treated and stabilized Continue IV antibiotics at Home Ceftriaxone 2 gm IV every 12 hours 9/23-9/30.  Ampicillin 2 gm IV every 4 hours 9/23-10/7.  Follow up with neurology as scheduled Follow up with Infectious Diseases as scheduled Follow up with Primary care physician  Follow up with orthopedics Monitor blood sugar and report to primary care physician Return if symptoms recur, worsen or new problems develop.    Discharge Diagnoses:  Principal Problem:   Meningitis   Meningoencephalitis - resolved    Hypertension   Thyroid disease    Diabetes mellitus   Dyslipidemia   Encephalopathy acute   Fever   OSA on CPAP   Obesity   Hyperglycemia   Numbness and tingling   Acute CVA (cerebrovascular accident)  Discharge Condition:  Stable   Diet recommendation: heart healthy, diabetic    Filed Weights   03/23/13 0532  Weight: 126.1 kg (278 lb)    History of present illness:  This is a 67 year old gentleman who became acutely confused today. For approximately a week the patient complains of headache. Hesaw his PCP and was treated for sinus infection with amoxicillin and Allegra. His headaches have continued, he's had feversand chills and continues to feel generally bad. Today he became acutely confused and was taken to urgent care. His temperature was 102, he was directed to [facility]. He's also had nausea but no vomiting, he has been dizzy. Today he had trouble walking in a coordinated manner. When asked for his insurance card he was looking for drivers license. During my interview the patient was able to provide some history but is still confused. History provided by the wife and his son who  are at the bedside. The ER physician attempted to do a lumbar puncture but was unsuccessful.  At home patient's fingerstick blood sugar was checked it was in the low 100s, here the ER his blood sugar was 50. His received an amp of D50. Fingerstick blood sugars have normalized, patient continues to be confused and febrile.  Hospital Course:  1. Acute encephalopathy/Meningitis: Aseptic meningitis vs partially treated meningitis. Pt is improving daily. Patient presented with one week of headache, and was on Augumentin from 03/18/13, prescribed by PMD, for possible sinus infection. He became pyrexial/confused on 03/22/13, with temperature as high as 102, associated with nausea, dizziness and ataxia. Wcc was normal. CXR was negative for acute disease, head CT scan showed no acute findings, and urinalysis was negative. The clincal picture is suspicious for acute meningitis/encephalitis. Now on iv Vancomycin/Rocephin/Ampicillin, day #3. LP revealed a pleocytosis of 121, with 2 neutrophils and 83 lymphocytes, Tot Pr 106, Gluc 31. Blood cultures NGTD. Continue supportive treatment. RPR is reactive 1:1, Vitamin B12, folate levels, ordered. Have consulted ID. HSV negative. Off IV acyclovir. Continue current therapy. PICC line placed for full 3 weeks of IV antibiotics per ID. Last dose of IV steroids given (total of 4 days). Confusion is improved. Pt is back to baseline mental status.  Pt will need to continue rocephin IV and ampicillin IV as mentioned above.   2. DM/Hyperglycemia: BS improving now that pt is off steroids. Will start cutting back on insulin doses. Continue supplemental insulin, follow and adjust as needed. Patient is a type-2 diabetic and  had been on glyburide outpatient. Continue frequent CBC checks, follow up with home oral diabetes medications.   4. Obstructive sleep apnea/morbid obesity: Stable. On CPAP.   5. Hypothyroidism: Continued on thyroxine replacement therapy. TSH is very low, Will lower  dose of levothyroxine to 250 mcg.   6. Acute 3 mm acute infarct in the left frontal white matter  - Right arm numbness remains but slight improvment, appreciate neurology consult. Pt on aspirin, ask neuro about plavix. EEG no seizure activity, carotid dopplers 40-59% ICA stenosis. Left = 1-39% ICA stenosis  .Pt had a recent ECHO done and was OK.  Neurology recommended discontinuing Aspirin and starting Plavix 75 mg po daily.   Code Status: Full Code.  Family Communication: Wife at bedside.  Disposition Plan: Home with PICC when stable   Consultations:  Neurology  Infectious Diseases   Discharge Exam: Pt is much more alert and awake today.  He still has some numbness in the right arm but strength improved.   Filed Vitals:   03/30/13 1000  BP: 128/66  Pulse: 58  Temp: 97.3 F (36.3 C)  Resp: 16   General: Comfortable, awake, easily arousable, alert, communicative.  HEENT: No clinical pallor, MMM, no jaundice, no conjunctival injection or discharge.  NECK: Supple, JVP not seen, no carotid bruits, no palpable lymphadenopathy, no palpable goiter.  CHEST: Clinically clear to auscultation, no wheezes, no crackles.  HEART: Sounds 1 and 2 heard, normal, regular, no murmurs.  ABDOMEN: Obese, non-tender, no palpable organomegaly, no palpable masses, normal bowel sounds.  UPPER EXTREMITIES: improved grip strength on right, 4-5/5, Left UE 5/5  LOWER EXTREMITIES: No pitting edema, palpable peripheral pulses.  MUSCULOSKELETAL SYSTEM: Generalized osteoarthritic changes.  CENTRAL NERVOUS SYSTEM: moving all extremities and mentating well  Discharge Instructions  Discharge Orders   Future Appointments Provider Department Dept Phone   04/20/2013 9:00 AM Ginnie Smart, MD [facility] [phone]   Future Orders Complete By Expires   Diet - low sodium heart healthy  As directed    Discharge instructions  As directed    Comments:     Continue IV  antibiotics at Home Ceftriaxone 2 gm IV every 12 hours 9/23-9/30.  Ampicillin 2 gm IV every 4 hours 9/23-10/7.  Follow up with neurology as scheduled Follow up with Infectious Diseases as scheduled Follow up with Primary care physician  Follow up with orthopedics Monitor blood sugar and report to primary care physician Return if symptoms recur, worsen or new problems develop.   Increase activity slowly  As directed        Medication List    STOP taking these medications       amoxicillin-clavulanate 875-125 MG per tablet  Commonly known as:  AUGMENTIN     aspirin 81 MG tablet     pravastatin 40 MG tablet  Commonly known as:  PRAVACHOL     spironolactone 25 MG tablet  Commonly known as:  ALDACTONE      TAKE these medications       amLODipine 10 MG tablet  Commonly known as:  NORVASC  Take 10 mg by mouth daily.     atorvastatin 40 MG tablet  Commonly known as:  LIPITOR  Take 1 tablet (40 mg total) by mouth daily.     benazepril 40 MG tablet  Commonly known as:  LOTENSIN  Take 40 mg by mouth 2 (two) times daily.     clopidogrel 75 MG tablet  Commonly known as:  PLAVIX  Take 1 tablet (75 mg total) by mouth daily with breakfast.  Start taking on:  03/31/2013     dextrose 5 % SOLN 50 mL with cefTRIAXone 2 G SOLR 2 g  Inject 2 g into the vein every 12 (twelve) hours.     fexofenadine 180 MG tablet  Commonly known as:  ALLEGRA  Take 180 mg by mouth daily.     glyBURIDE-metformin 5-500 MG per tablet  Commonly known as:  GLUCOVANCE  Take 1 tablet by mouth 3 (three) times daily.     levothyroxine 125 MCG tablet  Commonly known as:  SYNTHROID, LEVOTHROID  Take 2 tablets (250 mcg total) by mouth daily before breakfast.     metoprolol succinate 100 MG 24 hr tablet  Commonly known as:  TOPROL-XL  Take 100 mg by mouth daily.     sodium chloride 0.9 % SOLN 50 mL with ampicillin 2 G SOLR 2 g  Inject 2 g into the vein every 4 (four) hours.       Allergies   Allergen Reactions  . Codeine Nausea And Vomiting  . Demerol Nausea And Vomiting       Follow-up Information   Follow up with [name], MD In 2 weeks. [facility] Follow )    Specialty:  Family Medicine   Contact information:   [address] [place] [address] [place] [address] [phone]       Follow up with Gates Rigg, MD. Schedule an appointment as soon as possible for a visit in 2 months. (Follow up stroke)    Specialties:  Neurology, Radiology   Contact information:   [address] [place] [address] [phone]       Follow up with DUDA,MARCUS V, MD. Schedule an appointment as soon as possible for a visit in 1 month.   Specialty:  Orthopedic Surgery   Contact information:   [address] [place] [address] [phone]       Follow up with Johny Sax, MD On 04/20/2013. (9:00 AM Infectious Diseasese Followup )    Specialty:  Infectious Diseases   Contact information:   [address] [place] [address] [phone]        The results of significant diagnostics from this hospitalization (including imaging, microbiology, ancillary and laboratory) are listed below for reference.    Significant Diagnostic Studies: Dg Chest 2 View (if Patient Has Fever And/or Copd)  03/22/2013   CLINICAL DATA:  Shortness of breath and altered mental status  EXAM: CHEST  2 VIEW  COMPARISON:  01/23/2010  FINDINGS: Mild cardiomegaly, stable from prior. No change in upper mediastinal contours. Aortic atherosclerosis.  Negative for acute infiltrate, edema, effusion, or pneumothorax. Hyperinflation with mild interstitial coarsening, suggesting chronic lung disease, possibly COPD.  IMPRESSION: No active cardiopulmonary disease.   Electronically Signed   By: Tiburcio Pea   On: 03/22/2013 21:40   Ct Head Wo Contrast  03/23/2013   CLINICAL DATA:  Altered level of consciousness.  Headache.   EXAM: CT HEAD WITHOUT CONTRAST  TECHNIQUE: Contiguous axial images were obtained from the base of the skull through the vertex without intravenous contrast.  COMPARISON:  No priors.  FINDINGS: Patchy areas of mild decreased attenuation throughout the deep and periventricular white matter of the cerebral hemispheres bilaterally, compatible with chronic microvascular ischemic disease. No acute intracranial abnormalities. Specifically, no evidence of acute intracranial hemorrhage, no definite findings  of acute/subacute cerebral ischemia, no mass, mass effect, hydrocephalus or abnormal intra or extra-axial fluid collections. Visualized paranasal sinuses and mastoids are well pneumatized, with exception of a small amount of mucosal thickening in the maxillary and ethmoid sinuses bilaterally. No acute displaced skull fractures are identified.  IMPRESSION: 1. No acute intracranial abnormalities. 2. Mild chronic microvascular ischemic changes in the cerebral white matter, as above.   Electronically Signed   By: Trudie Reed M.D.   On: 03/23/2013 01:16   Mr Maxine Glenn Head Wo Contrast  03/28/2013   CLINICAL DATA:  Stroke. Confusion  EXAM: MRI HEAD WITHOUT CONTRAST  MRA HEAD WITHOUT CONTRAST  TECHNIQUE: Multiplanar, multiecho pulse sequences of the brain and surrounding structures were obtained without intravenous contrast. Angiographic images of the head were obtained using MRA technique without contrast.  COMPARISON:  CT head March 23, 2013  FINDINGS: MRI HEAD FINDINGS  Well defined hyperintensities in the right periventricular white matter, right external capsule, and right occipital lobe. These are compatible with chronic infarct and show T2 shine through on diffusion-weighted imaging. Mild chronic microvascular ischemic change in the periventricular white matter.  3 mm area of restricted diffusion in the left frontal white matter consistent with acute infarct.  Mild atrophy. Negative for hydrocephalus. Negative for  hemorrhage or mass lesion.  MRA HEAD FINDINGS  Both vertebral arteries are patent to the basilar. PICA, AICA, superior cerebellar arteries are widely patent. Fetal origin left posterior cerebral artery. Both posterior cerebral arteries are patent.  Mild atherosclerotic irregularity in the cavernous carotid bilaterally without stenosis. Anterior and middle cerebral arteries are patent without stenosis. Negative for aneurysm.  IMPRESSION: MRI HEAD IMPRESSION  Atrophy and chronic ischemic changes.  3 mm acute infarct in the left frontal white matter.  MRA HEAD IMPRESSION  No significant intracranial stenosis.   Electronically Signed   By: Marlan Palau M.D.   On: 03/28/2013 13:47   Mr Brain Wo Contrast  03/28/2013   CLINICAL DATA:  Stroke. Confusion  EXAM: MRI HEAD WITHOUT CONTRAST  MRA HEAD WITHOUT CONTRAST  TECHNIQUE: Multiplanar, multiecho pulse sequences of the brain and surrounding structures were obtained without intravenous contrast. Angiographic images of the head were obtained using MRA technique without contrast.  COMPARISON:  CT head March 23, 2013  FINDINGS: MRI HEAD FINDINGS  Well defined hyperintensities in the right periventricular white matter, right external capsule, and right occipital lobe. These are compatible with chronic infarct and show T2 shine through on diffusion-weighted imaging. Mild chronic microvascular ischemic change in the periventricular white matter.  3 mm area of restricted diffusion in the left frontal white matter consistent with acute infarct.  Mild atrophy. Negative for hydrocephalus. Negative for hemorrhage or mass lesion.  MRA HEAD FINDINGS  Both vertebral arteries are patent to the basilar. PICA, AICA, superior cerebellar arteries are widely patent. Fetal origin left posterior cerebral artery. Both posterior cerebral arteries are patent.  Mild atherosclerotic irregularity in the cavernous carotid bilaterally without stenosis. Anterior and middle cerebral arteries are  patent without stenosis. Negative for aneurysm.  IMPRESSION: MRI HEAD IMPRESSION  Atrophy and chronic ischemic changes.  3 mm acute infarct in the left frontal white matter.  MRA HEAD IMPRESSION  No significant intracranial stenosis.   Electronically Signed   By: Marlan Palau M.D.   On: 03/28/2013 13:47   Dg Fluoro Guide Lumbar Puncture  03/23/2013   CLINICAL DATA:  Headache with altered mental status. History of hypertension and diabetes.  EXAM: DIAGNOSTIC LUMBAR PUNCTURE UNDER FLUOROSCOPIC GUIDANCE  FLUOROSCOPY TIME:  15 seconds  PROCEDURE: Time out procedure was performed. Informed consent was obtained from the patient prior to the procedure, including potential complications of headache, allergy, and pain. With the patient prone, the lower back was prepped with Betadine. 1% Lidocaine was used for local anesthesia. Lumbar puncture was performed at the L2-3 level using a 12.5 cm 22 gauge needle with return of blood tinged CSF with an opening pressure of 32 cm water (measured prone through the needle). 9ml of CSF were obtained for laboratory studies. The patient tolerated the procedure well and there were no apparent complications.  IMPRESSION: Diagnostic lumbar puncture performed without any complications.   Electronically Signed   By: Roxy Horseman   On: 03/23/2013 13:07    Microbiology: Recent Results (from the past 240 hour(s))  URINE CULTURE     Status: None   Collection Time    03/22/13 11:05 PM      Result Value Range Status   Specimen Description URINE, CLEAN CATCH   Final   Special Requests Normal   Final   Culture  Setup Time     Final   Value: 03/23/2013 00:00     Performed at [facility] Count     Final   Value: NO GROWTH     Performed at [facility]   Culture     Final   Value: NO GROWTH     Performed at [facility]   Report Status 03/24/2013 FINAL   Final  CULTURE, BLOOD (ROUTINE X 2)     Status: None   Collection Time    03/22/13 11:50 PM       Result Value Range Status   Specimen Description BLOOD RIGHT ANTECUBITAL   Final   Special Requests BOTTLES DRAWN AEROBIC ONLY 5CC   Final   Culture  Setup Time     Final   Value: 03/23/2013 04:47     Performed at [facility]   Culture     Final   Value: NO GROWTH 5 DAYS     Performed at [facility]   Report Status 03/29/2013 FINAL   Final  CULTURE, BLOOD (ROUTINE X 2)     Status: None   Collection Time    03/23/13 12:13 AM      Result Value Range Status   Specimen Description BLOOD RIGHT HAND   Final   Special Requests BOTTLES DRAWN AEROBIC ONLY 5CC   Final   Culture  Setup Time     Final   Value: 03/23/2013 04:48     Performed at [facility]   Culture     Final   Value: NO GROWTH 5 DAYS     Performed at [facility]   Report Status 03/29/2013 FINAL   Final  CSF CULTURE     Status: None   Collection Time    03/23/13 12:10 PM      Result Value Range Status   Specimen Description CSF   Final   Special Requests 2.1ML CSF FLUID   Final   Gram Stain     Final   Value: WBC PRESENT, PREDOMINANTLY PMN     NO ORGANISMS SEEN     Performed at [facility]     Performed at [facility]   Culture     Final   Value: NO GROWTH 3 DAYS     Performed at [facility]   Report  Status 03/27/2013 FINAL   Final  GRAM STAIN     Status: None   Collection Time    03/23/13 12:10 PM      Result Value Range Status   Specimen Description CSF   Final   Special Requests 2.1 ML CSF FLUID   Final   Gram Stain     Final   Value: WBC PRESENT, PREDOMINANTLY MONONUCLEAR     NO ORGANISMS SEEN     CYTOSPIN SLIDE   Report Status 03/23/2013 FINAL   Final     Labs: Basic Metabolic Panel:  Recent Labs Lab 03/24/13 0659 03/25/13 0442 03/28/13 0500 03/30/13 0500  NA 132* 133* 136 143  K 3.9 4.4 3.9 3.6  CL 97 97 99 106  CO2 23 24 26 30   GLUCOSE 94 280* 274* 82  BUN 7 12 22 22   CREATININE 0.63 0.77 0.59 0.70  CALCIUM 9.2 9.1 9.2  8.0*   Liver Function Tests: No results found for this basename: AST, ALT, ALKPHOS, BILITOT, PROT, ALBUMIN,  in the last 168 hours No results found for this basename: LIPASE, AMYLASE,  in the last 168 hours No results found for this basename: AMMONIA,  in the last 168 hours CBC:  Recent Labs Lab 03/24/13 0659 03/25/13 0442 03/28/13 0500  WBC 10.2 7.9 13.1*  HGB 13.8 14.0 14.0  HCT 38.5* 38.8* 38.9*  MCV 84.6 83.6 84.7  PLT 205 221 252   Cardiac Enzymes: No results found for this basename: CKTOTAL, CKMB, CKMBINDEX, TROPONINI,  in the last 168 hours BNP: BNP (last 3 results) No results found for this basename: PROBNP,  in the last 8760 hours CBG:  Recent Labs Lab 03/29/13 0643 03/29/13 1155 03/29/13 1705 03/29/13 2213 03/30/13 0617  GLUCAP 195* 268* 265* 129* 88   I spent 42 mins preparing discharge, reviewing records, labs, consult notes, reconciling medications, writing prescriptions, etc.   Signed:  Roberta Angell  [facility] 03/30/2013, 10:55 AM

## 2013-03-30 NOTE — Progress Notes (Signed)
[facility]  Patient Status: New pt for [facility] this admissions  [facility] is providing the following services: Pt will have HHRN and Home Infusion Pharmacy services for home IV ABX.  [facility] Coordinator team will follow and support transition home when ready.  If patient discharges after hours, please call [phone].   Mason Patton 03/30/2013, 8:46 AM

## 2013-03-30 NOTE — Progress Notes (Signed)
OT Cancellation Note  Patient Details Name: SILUS LANZO MRN: [id number] DOB: [birth year]   Cancelled Treatment:    Reason Eval/Treat Not Completed: Other (comment) Pt verbalizing he has no OT needs at this time. OT to sign off.   Earlie Raveling OTR/L [phone] 03/30/2013, 3:56 PM

## 2013-03-30 NOTE — Progress Notes (Signed)
Patient is being discharged from room 4N11 at this time. Alert and in stable condition. PICC line to upper right arm hep locked by IV team. Instructions read to patient and family and verbalized understanding. Family at side and took belongings with him.

## 2013-03-31 ENCOUNTER — Encounter (HOSPITAL_COMMUNITY): Admission: RE | Payer: Self-pay | Source: Ambulatory Visit

## 2013-03-31 ENCOUNTER — Inpatient Hospital Stay (HOSPITAL_COMMUNITY): Admission: RE | Admit: 2013-03-31 | Payer: 59 | Source: Ambulatory Visit | Admitting: Orthopedic Surgery

## 2013-03-31 SURGERY — ARTHROPLASTY, KNEE, TOTAL
Anesthesia: General | Site: Knee | Laterality: Left

## 2013-04-09 LAB — ARBOVIRUS PANEL, ~~LOC~~ LAB

## 2013-04-20 ENCOUNTER — Ambulatory Visit (INDEPENDENT_AMBULATORY_CARE_PROVIDER_SITE_OTHER): Payer: 59 | Admitting: Infectious Diseases

## 2013-04-20 VITALS — BP 148/77 | HR 54 | Temp 97.5°F | Wt 271.0 lb

## 2013-04-20 DIAGNOSIS — I639 Cerebral infarction, unspecified: Secondary | ICD-10-CM

## 2013-04-20 DIAGNOSIS — G0490 Encephalitis and encephalomyelitis, unspecified: Secondary | ICD-10-CM

## 2013-04-20 DIAGNOSIS — I6350 Cerebral infarction due to unspecified occlusion or stenosis of unspecified cerebral artery: Secondary | ICD-10-CM

## 2013-04-20 NOTE — Assessment & Plan Note (Signed)
He has no sequelae of this. He has neuro f/u.

## 2013-04-20 NOTE — Assessment & Plan Note (Signed)
He is doing very well. I offered to repeat the West Nile serologies to see if he converted his IgM. He declines. I wrote him a note for out of work for the rest of the month. I suggested that his recuperation from his illness will be on ongoing process and that he should continue to try to exert himself as he tolerates. He will rtc prn

## 2013-04-20 NOTE — Progress Notes (Signed)
  Subjective:    Patient ID: Mason Patton, male    DOB: Jun 07, 1946, 67 y.o.   MRN: [id number]  HPI 67 yo M wihx hx of DM2 who came to the hospital with 1 week of headaches on 03-23-13. He was treated with augmentin and allegra (9-11) by his PCP for a sinus infection. He developed fever and chills, and on 9-16 became confused. He was found to have Glc 54 in the ED, his confusion did not improve after D50. His temp was 102.7. He had a CT of the head which showed no acute change. He underwent LP showing WBC 121 (83% L), TProt 106, Glc 31. His g/s was (-). He was started on vanco/ceftriaxone/amp. He improved in the hospital and his vanco was stopped. He was sent home on 9-23 to complete a total of 3 weeks of ampicillin (04-13-13), 2 weeks of ceftriaxone (04-06-13). PIC is out.  Also, he was noted to have new L frontal 3 mm acute infarct on MRI. He was d/c home on plavix, lipitor due to this.  HIV RNA (-), HSV PCR (-), CSF VDRL (-), serum RPR 1:1, Ehrlichia (-), RMSF IgM (-).  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   He returns today with c/o weakness. No specific area of weakness. Has neuro f/u on 06-01-13.  FSG has been 95-120.   Review of Systems  Constitutional: Negative for fever and chills.  Eyes: Negative for visual disturbance.  Respiratory: Negative for shortness of breath.   Gastrointestinal: Negative for diarrhea and constipation.  Genitourinary: Negative for dysuria.  Neurological: Negative for headaches.      Objective:   Physical Exam  Constitutional: He appears well-developed and well-nourished.  HENT:  Mouth/Throat: No oropharyngeal exudate.  Eyes: EOM are normal. Pupils are equal, round, and reactive to light. Right conjunctiva is injected. Left conjunctiva is injected.  Neck: Neck supple.  Cardiovascular: Normal rate, regular rhythm and normal heart sounds.   Pulmonary/Chest: Effort normal and breath sounds normal.  Abdominal: Soft. Bowel sounds are normal. There  is no tenderness.  Musculoskeletal:       Arms: Lymphadenopathy:    He has no cervical adenopathy.  Neurological: He has normal strength. No cranial nerve deficit. Coordination normal.          Assessment & Plan:

## 2013-04-23 ENCOUNTER — Encounter: Payer: Self-pay | Admitting: Infectious Disease

## 2013-05-10 ENCOUNTER — Other Ambulatory Visit (HOSPITAL_COMMUNITY): Payer: Self-pay | Admitting: Orthopedic Surgery

## 2013-05-12 ENCOUNTER — Encounter (HOSPITAL_COMMUNITY): Payer: Self-pay

## 2013-05-13 ENCOUNTER — Encounter: Payer: Self-pay | Admitting: Infectious Disease

## 2013-05-14 ENCOUNTER — Encounter (HOSPITAL_COMMUNITY)
Admission: RE | Admit: 2013-05-14 | Discharge: 2013-05-14 | Disposition: A | Discharge status: Home / Self Care | Payer: 59 | Source: Ambulatory Visit | Attending: Orthopedic Surgery | Admitting: Orthopedic Surgery

## 2013-05-14 ENCOUNTER — Encounter: Payer: Self-pay | Admitting: Infectious Diseases

## 2013-05-14 ENCOUNTER — Encounter (HOSPITAL_COMMUNITY): Payer: Self-pay

## 2013-05-14 DIAGNOSIS — Z01812 Encounter for preprocedural laboratory examination: Secondary | ICD-10-CM | POA: Insufficient documentation

## 2013-05-14 HISTORY — DX: Encephalitis and encephalomyelitis, unspecified: G04.90

## 2013-05-14 HISTORY — DX: Shortness of breath: R06.02

## 2013-05-14 LAB — COMPREHENSIVE METABOLIC PANEL
ALT: 12 U/L (ref 0–53)
AST: 14 U/L (ref 0–37)
Albumin: 3.6 g/dL (ref 3.5–5.2)
Calcium: 9.7 mg/dL (ref 8.4–10.5)
GFR calc Af Amer: >90 mL/min (ref >90)
Potassium: 4.5 mEq/L (ref 3.5–5.1)
Sodium: 142 mEq/L (ref 135–145)
Total Protein: 6.9 g/dL (ref 6.0–8.3)

## 2013-05-14 LAB — CBC
HCT: 37.7 % — ABNORMAL LOW (ref 39.0–52.0)
Hemoglobin: 13.2 g/dL (ref 13.0–17.0)
MCH: 30.1 pg (ref 26.0–34.0)
MCHC: 35 g/dL (ref 30.0–36.0)
MCV: 86.1 fL (ref 78.0–100.0)
Platelets: 196 10*3/uL (ref 150–400)
RBC: 4.38 MIL/uL (ref 4.22–5.81)
WBC: 9.7 10*3/uL (ref 4.0–10.5)

## 2013-05-14 LAB — TYPE AND SCREEN
ABO/RH(D): A POS
Antibody Screen: NEGATIVE

## 2013-05-14 LAB — PROTIME-INR: INR: 1 (ref 0.00–1.49)

## 2013-05-14 LAB — SURGICAL PCR SCREEN
MRSA, PCR: NEGATIVE
Staphylococcus aureus: NEGATIVE

## 2013-05-14 NOTE — Pre-Procedure Instructions (Signed)
Mason Patton  05/14/2013   Your procedure is scheduled on:  05/19/13  Report to [facility]  2 * 3 at 630 AM.  Call this number if you have problems the morning of surgery: [phone]   Remember:   Do not eat food or drink liquids after midnight.   Take these medicines the morning of surgery with A SIP OF WATER: all inhalers,norvasc,synthroid,metoprolol   Do not wear jewelry, make-up or nail polish.  Do not wear lotions, powders, or perfumes. You may wear deodorant.  Do not shave 48 hours prior to surgery. Men may shave face and neck.  Do not bring valuables to the hospital.  [facility] is not responsible                  for any belongings or valuables.               Contacts, dentures or bridgework may not be worn into surgery.  Leave suitcase in the car. After surgery it may be brought to your room.  For patients admitted to the hospital, discharge time is determined by your                treatment team.               Patients discharged the day of surgery will not be allowed to drive  home.  Name and phone number of your driver: family  Special Instructions: Shower using CHG 2 nights before surgery and the night before surgery.  If you shower the day of surgery use CHG.  Use special wash - you have one bottle of CHG for all showers.  You should use approximately 1/3 of the bottle for each shower.   Please read over the following fact sheets that you were given: Pain Booklet, Coughing and Deep Breathing, Blood Transfusion Information, MRSA Information and Surgical Site Infection Prevention

## 2013-05-18 MED ORDER — DEXTROSE 5 % IV SOLN
3.0000 g | INTRAVENOUS | Status: AC
  Administered 2013-05-19: 3 g via INTRAVENOUS
  Filled 2013-05-18: qty 3000

## 2013-05-19 ENCOUNTER — Inpatient Hospital Stay (HOSPITAL_COMMUNITY)
Admission: RE | Admit: 2013-05-19 | Discharge: 2013-05-21 | DRG: 470 | Disposition: A | Discharge status: Home Health | Payer: 59 | Source: Ambulatory Visit | Attending: Orthopedic Surgery | Admitting: Orthopedic Surgery

## 2013-05-19 ENCOUNTER — Encounter (HOSPITAL_COMMUNITY): Payer: 59 | Admitting: Anesthesiology

## 2013-05-19 ENCOUNTER — Encounter (HOSPITAL_COMMUNITY)
Admission: RE | Disposition: A | Discharge status: Home Health | Payer: Self-pay | Source: Ambulatory Visit | Attending: Orthopedic Surgery

## 2013-05-19 ENCOUNTER — Encounter (HOSPITAL_COMMUNITY): Payer: Self-pay | Admitting: *Deleted

## 2013-05-19 ENCOUNTER — Inpatient Hospital Stay (HOSPITAL_COMMUNITY): Payer: 59 | Admitting: Anesthesiology

## 2013-05-19 DIAGNOSIS — Z6838 Body mass index (BMI) 38.0-38.9, adult: Secondary | ICD-10-CM

## 2013-05-19 DIAGNOSIS — E119 Type 2 diabetes mellitus without complications: Secondary | ICD-10-CM | POA: Diagnosis present

## 2013-05-19 DIAGNOSIS — Z96659 Presence of unspecified artificial knee joint: Secondary | ICD-10-CM

## 2013-05-19 DIAGNOSIS — Z79899 Other long term (current) drug therapy: Secondary | ICD-10-CM

## 2013-05-19 DIAGNOSIS — Z801 Family history of malignant neoplasm of trachea, bronchus and lung: Secondary | ICD-10-CM

## 2013-05-19 DIAGNOSIS — Z7902 Long term (current) use of antithrombotics/antiplatelets: Secondary | ICD-10-CM

## 2013-05-19 DIAGNOSIS — E669 Obesity, unspecified: Secondary | ICD-10-CM | POA: Diagnosis present

## 2013-05-19 DIAGNOSIS — Z825 Family history of asthma and other chronic lower respiratory diseases: Secondary | ICD-10-CM

## 2013-05-19 DIAGNOSIS — M1710 Unilateral primary osteoarthritis, unspecified knee: Principal | ICD-10-CM | POA: Diagnosis present

## 2013-05-19 DIAGNOSIS — J438 Other emphysema: Secondary | ICD-10-CM | POA: Diagnosis present

## 2013-05-19 DIAGNOSIS — Z8673 Personal history of transient ischemic attack (TIA), and cerebral infarction without residual deficits: Secondary | ICD-10-CM

## 2013-05-19 DIAGNOSIS — Z9089 Acquired absence of other organs: Secondary | ICD-10-CM

## 2013-05-19 DIAGNOSIS — Z888 Allergy status to other drugs, medicaments and biological substances status: Secondary | ICD-10-CM

## 2013-05-19 DIAGNOSIS — L259 Unspecified contact dermatitis, unspecified cause: Secondary | ICD-10-CM | POA: Diagnosis present

## 2013-05-19 DIAGNOSIS — E785 Hyperlipidemia, unspecified: Secondary | ICD-10-CM | POA: Diagnosis present

## 2013-05-19 DIAGNOSIS — I10 Essential (primary) hypertension: Secondary | ICD-10-CM | POA: Diagnosis present

## 2013-05-19 DIAGNOSIS — Z96652 Presence of left artificial knee joint: Secondary | ICD-10-CM

## 2013-05-19 DIAGNOSIS — Z800 Family history of malignant neoplasm of digestive organs: Secondary | ICD-10-CM

## 2013-05-19 DIAGNOSIS — G4733 Obstructive sleep apnea (adult) (pediatric): Secondary | ICD-10-CM | POA: Diagnosis present

## 2013-05-19 DIAGNOSIS — Z833 Family history of diabetes mellitus: Secondary | ICD-10-CM

## 2013-05-19 DIAGNOSIS — Z87891 Personal history of nicotine dependence: Secondary | ICD-10-CM

## 2013-05-19 HISTORY — DX: Type 2 diabetes mellitus without complications: E11.9

## 2013-05-19 HISTORY — DX: Cerebral infarction, unspecified: I63.9

## 2013-05-19 HISTORY — DX: Hypothyroidism, unspecified: E03.9

## 2013-05-19 HISTORY — DX: Cardiac murmur, unspecified: R01.1

## 2013-05-19 HISTORY — DX: Obstructive sleep apnea (adult) (pediatric): Z99.89

## 2013-05-19 HISTORY — PX: TOTAL KNEE ARTHROPLASTY: SHX125

## 2013-05-19 HISTORY — DX: Pneumonia, unspecified organism: J18.9

## 2013-05-19 HISTORY — DX: Obstructive sleep apnea (adult) (pediatric): G47.33

## 2013-05-19 LAB — GLUCOSE, CAPILLARY
Glucose-Capillary: 172 mg/dL — ABNORMAL HIGH (ref 70–99)
Glucose-Capillary: 155 mg/dL — ABNORMAL HIGH (ref 70–99)
Glucose-Capillary: 153 mg/dL — ABNORMAL HIGH (ref 70–99)

## 2013-05-19 SURGERY — ARTHROPLASTY, KNEE, TOTAL
Anesthesia: General | Site: Knee | Laterality: Left | Wound class: Clean

## 2013-05-19 MED ORDER — ONDANSETRON HCL 4 MG/2ML IJ SOLN
INTRAMUSCULAR | Status: DC | PRN
  Administered 2013-05-19: 4 mg via INTRAVENOUS

## 2013-05-19 MED ORDER — SODIUM CHLORIDE 0.9 % IV SOLN
INTRAVENOUS | Status: DC
  Administered 2013-05-19: 21:00:00 via INTRAVENOUS

## 2013-05-19 MED ORDER — SODIUM CHLORIDE 0.9 % IJ SOLN
INTRAMUSCULAR | Status: DC | PRN
  Administered 2013-05-19: 09:00:00

## 2013-05-19 MED ORDER — ALBUTEROL SULFATE HFA 108 (90 BASE) MCG/ACT IN AERS
2.0000 | INHALATION_SPRAY | RESPIRATORY_TRACT | Status: DC | PRN
  Filled 2013-05-19: qty 6.7

## 2013-05-19 MED ORDER — OXYCODONE HCL 5 MG PO TABS
5.0000 mg | ORAL_TABLET | ORAL | Status: DC | PRN
  Administered 2013-05-19 – 2013-05-21 (×4): 10 mg via ORAL
  Filled 2013-05-19 (×3): qty 2

## 2013-05-19 MED ORDER — PROPOFOL 10 MG/ML IV BOLUS
INTRAVENOUS | Status: DC | PRN
  Administered 2013-05-19: 200 mg via INTRAVENOUS

## 2013-05-19 MED ORDER — ACETAMINOPHEN 650 MG RE SUPP: 650.0000 mg | Freq: Four times a day (QID) | RECTAL | Status: DC | PRN

## 2013-05-19 MED ORDER — HYDROMORPHONE HCL PF 1 MG/ML IJ SOLN
0.2500 mg | INTRAMUSCULAR | Status: DC | PRN
  Administered 2013-05-19: 0.5 mg via INTRAVENOUS

## 2013-05-19 MED ORDER — SENNOSIDES-DOCUSATE SODIUM 8.6-50 MG PO TABS: 1.0000 | ORAL_TABLET | Freq: Every evening | ORAL | Status: DC | PRN

## 2013-05-19 MED ORDER — OXYCODONE HCL 5 MG PO TABS: 5.0000 mg | ORAL_TABLET | Freq: Once | ORAL | Status: DC | PRN

## 2013-05-19 MED ORDER — LORATADINE 10 MG PO TABS
10.0000 mg | ORAL_TABLET | Freq: Every day | ORAL | Status: DC
  Administered 2013-05-20: 10 mg via ORAL
  Filled 2013-05-19 (×3): qty 1

## 2013-05-19 MED ORDER — BUPIVACAINE-EPINEPHRINE PF 0.5-1:200000 % IJ SOLN
INTRAMUSCULAR | Status: DC | PRN
  Administered 2013-05-19: 30 mL

## 2013-05-19 MED ORDER — ACETAMINOPHEN 325 MG PO TABS: 650.0000 mg | ORAL_TABLET | Freq: Four times a day (QID) | ORAL | Status: DC | PRN

## 2013-05-19 MED ORDER — CLOPIDOGREL BISULFATE 75 MG PO TABS
75.0000 mg | ORAL_TABLET | Freq: Every day | ORAL | Status: DC
  Administered 2013-05-20 – 2013-05-21 (×2): 75 mg via ORAL
  Filled 2013-05-19 (×3): qty 1

## 2013-05-19 MED ORDER — LEVOTHYROXINE SODIUM 125 MCG PO TABS: 250.0000 ug | ORAL_TABLET | Freq: Every day | ORAL | Status: DC

## 2013-05-19 MED ORDER — BISACODYL 5 MG PO TBEC: 5.0000 mg | DELAYED_RELEASE_TABLET | Freq: Every day | ORAL | Status: DC | PRN

## 2013-05-19 MED ORDER — METFORMIN HCL 500 MG PO TABS
500.0000 mg | ORAL_TABLET | Freq: Three times a day (TID) | ORAL | Status: DC
  Administered 2013-05-19 – 2013-05-21 (×5): 500 mg via ORAL
  Filled 2013-05-19 (×8): qty 1

## 2013-05-19 MED ORDER — GLYBURIDE 5 MG PO TABS
5.0000 mg | ORAL_TABLET | Freq: Three times a day (TID) | ORAL | Status: DC
  Administered 2013-05-19 – 2013-05-21 (×5): 5 mg via ORAL
  Filled 2013-05-19 (×8): qty 1

## 2013-05-19 MED ORDER — KETOROLAC TROMETHAMINE 15 MG/ML IJ SOLN
7.5000 mg | Freq: Four times a day (QID) | INTRAMUSCULAR | Status: AC
  Administered 2013-05-19 – 2013-05-20 (×4): 7.5 mg via INTRAVENOUS
  Filled 2013-05-19 (×3): qty 1

## 2013-05-19 MED ORDER — MAGNESIUM CITRATE PO SOLN
1.0000 | Freq: Once | ORAL | Status: AC | PRN
  Filled 2013-05-19: qty 296

## 2013-05-19 MED ORDER — NEOSTIGMINE METHYLSULFATE 1 MG/ML IJ SOLN
INTRAMUSCULAR | Status: DC | PRN
  Administered 2013-05-19: 5 mg via INTRAVENOUS

## 2013-05-19 MED ORDER — CEFAZOLIN SODIUM-DEXTROSE 2-3 GM-% IV SOLR
2.0000 g | Freq: Four times a day (QID) | INTRAVENOUS | Status: AC
  Administered 2013-05-19 (×2): 2 g via INTRAVENOUS
  Filled 2013-05-19 (×3): qty 50

## 2013-05-19 MED ORDER — ONDANSETRON HCL 4 MG PO TABS: 4.0000 mg | ORAL_TABLET | Freq: Four times a day (QID) | ORAL | Status: DC | PRN

## 2013-05-19 MED ORDER — SODIUM CHLORIDE 0.9 % IR SOLN
Status: DC | PRN
  Administered 2013-05-19: 1000 mL

## 2013-05-19 MED ORDER — AMLODIPINE BESYLATE 10 MG PO TABS
10.0000 mg | ORAL_TABLET | Freq: Every day | ORAL | Status: DC
Start: 2013-05-20 — End: 2013-05-21
  Administered 2013-05-20 – 2013-05-21 (×2): 10 mg via ORAL
  Filled 2013-05-19 (×2): qty 1

## 2013-05-19 MED ORDER — METOPROLOL SUCCINATE ER 100 MG PO TB24
100.0000 mg | ORAL_TABLET | Freq: Every day | ORAL | Status: DC
  Administered 2013-05-20 – 2013-05-21 (×2): 100 mg via ORAL
  Filled 2013-05-19 (×2): qty 1

## 2013-05-19 MED ORDER — ONDANSETRON HCL 4 MG/2ML IJ SOLN
4.0000 mg | Freq: Four times a day (QID) | INTRAMUSCULAR | Status: DC | PRN
  Administered 2013-05-19 – 2013-05-20 (×3): 4 mg via INTRAVENOUS
  Filled 2013-05-19 (×3): qty 2

## 2013-05-19 MED ORDER — MENTHOL 3 MG MT LOZG: 1.0000 | LOZENGE | OROMUCOSAL | Status: DC | PRN

## 2013-05-19 MED ORDER — ROCURONIUM BROMIDE 100 MG/10ML IV SOLN
INTRAVENOUS | Status: DC | PRN
  Administered 2013-05-19: 50 mg via INTRAVENOUS

## 2013-05-19 MED ORDER — OXYCODONE HCL 5 MG/5ML PO SOLN: 5.0000 mg | Freq: Once | ORAL | Status: DC | PRN

## 2013-05-19 MED ORDER — LEVOTHYROXINE SODIUM 125 MCG PO TABS
250.0000 ug | ORAL_TABLET | Freq: Every day | ORAL | Status: DC
  Administered 2013-05-20 – 2013-05-21 (×2): 250 ug via ORAL
  Filled 2013-05-19 (×3): qty 2

## 2013-05-19 MED ORDER — METOCLOPRAMIDE HCL 5 MG/ML IJ SOLN
5.0000 mg | Freq: Three times a day (TID) | INTRAMUSCULAR | Status: DC | PRN
  Administered 2013-05-19: 10 mg via INTRAVENOUS
  Filled 2013-05-19: qty 2

## 2013-05-19 MED ORDER — LACTATED RINGERS IV SOLN
INTRAVENOUS | Status: DC | PRN
  Administered 2013-05-19 (×2): via INTRAVENOUS

## 2013-05-19 MED ORDER — DOCUSATE SODIUM 100 MG PO CAPS
100.0000 mg | ORAL_CAPSULE | Freq: Two times a day (BID) | ORAL | Status: DC
  Administered 2013-05-20: 100 mg via ORAL
  Filled 2013-05-19 (×5): qty 1

## 2013-05-19 MED ORDER — HYDROMORPHONE HCL PF 1 MG/ML IJ SOLN
1.0000 mg | INTRAMUSCULAR | Status: DC | PRN
  Administered 2013-05-19 – 2013-05-20 (×5): 1 mg via INTRAVENOUS
  Filled 2013-05-19 (×5): qty 1

## 2013-05-19 MED ORDER — HYDROMORPHONE HCL PF 1 MG/ML IJ SOLN
INTRAMUSCULAR | Status: AC
  Filled 2013-05-19: qty 1

## 2013-05-19 MED ORDER — GLYBURIDE-METFORMIN 5-500 MG PO TABS: 1.0000 | ORAL_TABLET | Freq: Three times a day (TID) | ORAL | Status: DC

## 2013-05-19 MED ORDER — ATORVASTATIN CALCIUM 40 MG PO TABS
40.0000 mg | ORAL_TABLET | Freq: Every day | ORAL | Status: DC
  Administered 2013-05-19 – 2013-05-21 (×3): 40 mg via ORAL
  Filled 2013-05-19 (×3): qty 1

## 2013-05-19 MED ORDER — BENAZEPRIL HCL 40 MG PO TABS
40.0000 mg | ORAL_TABLET | Freq: Two times a day (BID) | ORAL | Status: DC
  Administered 2013-05-19 – 2013-05-21 (×4): 40 mg via ORAL
  Filled 2013-05-19 (×5): qty 1

## 2013-05-19 MED ORDER — OXYCODONE HCL 5 MG PO TABS
ORAL_TABLET | ORAL | Status: AC
  Filled 2013-05-19: qty 2

## 2013-05-19 MED ORDER — GLYCOPYRROLATE 0.2 MG/ML IJ SOLN
INTRAMUSCULAR | Status: DC | PRN
  Administered 2013-05-19: 0.6 mg via INTRAVENOUS

## 2013-05-19 MED ORDER — KETOROLAC TROMETHAMINE 30 MG/ML IJ SOLN
INTRAMUSCULAR | Status: AC
  Filled 2013-05-19: qty 1

## 2013-05-19 MED ORDER — FENTANYL CITRATE 0.05 MG/ML IJ SOLN
INTRAMUSCULAR | Status: DC | PRN
  Administered 2013-05-19: 100 ug via INTRAVENOUS
  Administered 2013-05-19: 50 ug via INTRAVENOUS
  Administered 2013-05-19 (×2): 100 ug via INTRAVENOUS

## 2013-05-19 MED ORDER — 0.9 % SODIUM CHLORIDE (POUR BTL) OPTIME
TOPICAL | Status: DC | PRN
  Administered 2013-05-19: 1000 mL

## 2013-05-19 MED ORDER — PROMETHAZINE HCL 25 MG/ML IJ SOLN: 6.2500 mg | INTRAMUSCULAR | Status: DC | PRN

## 2013-05-19 MED ORDER — MIDAZOLAM HCL 5 MG/5ML IJ SOLN
INTRAMUSCULAR | Status: DC | PRN
  Administered 2013-05-19: 2 mg via INTRAVENOUS

## 2013-05-19 MED ORDER — PHENOL 1.4 % MT LIQD: 1.0000 | OROMUCOSAL | Status: DC | PRN

## 2013-05-19 MED ORDER — LIDOCAINE HCL (CARDIAC) 20 MG/ML IV SOLN
INTRAVENOUS | Status: DC | PRN
  Administered 2013-05-19: 60 mg via INTRAVENOUS

## 2013-05-19 MED ORDER — METOCLOPRAMIDE HCL 10 MG PO TABS: 5.0000 mg | ORAL_TABLET | Freq: Three times a day (TID) | ORAL | Status: DC | PRN

## 2013-05-19 SURGICAL SUPPLY — 50 items
BLADE SAGITTAL 25.0X1.27X90 (BLADE) ×2 IMPLANT
BLADE SAW SGTL 13.0X1.19X90.0M (BLADE) ×2 IMPLANT
BLADE SURG 21 STRL SS (BLADE) ×3 IMPLANT
BNDG COHESIVE 6X5 TAN STRL LF (GAUZE/BANDAGES/DRESSINGS) ×3 IMPLANT
BONE CEMENT PALACOSE (Orthopedic Implant) ×4 IMPLANT
BOWL SMART MIX CTS (DISPOSABLE) ×1 IMPLANT
CAP POR TM CP VIT E LN CER HD ×1 IMPLANT
CEMENT BONE PALACOSE (Orthopedic Implant) ×2 IMPLANT
CLOTH BEACON ORANGE TIMEOUT ST (SAFETY) ×2 IMPLANT
COVER SURGICAL LIGHT HANDLE (MISCELLANEOUS) ×2 IMPLANT
CUFF TOURNIQUET SINGLE 34IN LL (TOURNIQUET CUFF) ×1 IMPLANT
CUFF TOURNIQUET SINGLE 44IN (TOURNIQUET CUFF) IMPLANT
DRAPE EXTREMITY T 121X128X90 (DRAPE) ×2 IMPLANT
DRAPE PROXIMA HALF (DRAPES) ×2 IMPLANT
DRAPE U-SHAPE 47X51 STRL (DRAPES) ×2 IMPLANT
DRSG ADAPTIC 3X8 NADH LF (GAUZE/BANDAGES/DRESSINGS) ×2 IMPLANT
DRSG PAD ABDOMINAL 8X10 ST (GAUZE/BANDAGES/DRESSINGS) ×2 IMPLANT
DURAPREP 26ML APPLICATOR (WOUND CARE) ×2 IMPLANT
ELECT REM PT RETURN 9FT ADLT (ELECTROSURGICAL) ×2
ELECTRODE REM PT RTRN 9FT ADLT (ELECTROSURGICAL) ×1 IMPLANT
FACESHIELD LNG OPTICON STERILE (SAFETY) ×2 IMPLANT
GLOVE BIOGEL PI IND STRL 9 (GLOVE) ×1 IMPLANT
GLOVE BIOGEL PI INDICATOR 9 (GLOVE) ×1
GLOVE SURG ORTHO 9.0 STRL STRW (GLOVE) ×2 IMPLANT
GOWN PREVENTION PLUS XLARGE (GOWN DISPOSABLE) ×2 IMPLANT
GOWN SRG XL XLNG 56XLVL 4 (GOWN DISPOSABLE) ×2 IMPLANT
GOWN STRL NON-REIN XL XLG LVL4 (GOWN DISPOSABLE) ×4
HANDPIECE INTERPULSE COAX TIP (DISPOSABLE) ×2
KIT BASIN OR (CUSTOM PROCEDURE TRAY) ×2 IMPLANT
KIT ROOM TURNOVER OR (KITS) ×2 IMPLANT
MANIFOLD NEPTUNE II (INSTRUMENTS) ×2 IMPLANT
NDL SPNL 18GX3.5 QUINCKE PK (NEEDLE) ×1 IMPLANT
NEEDLE SPNL 18GX3.5 QUINCKE PK (NEEDLE) ×2 IMPLANT
NS IRRIG 1000ML POUR BTL (IV SOLUTION) ×2 IMPLANT
PACK TOTAL JOINT (CUSTOM PROCEDURE TRAY) ×2 IMPLANT
PAD ARMBOARD 7.5X6 YLW CONV (MISCELLANEOUS) ×4 IMPLANT
PADDING CAST COTTON 6X4 STRL (CAST SUPPLIES) ×2 IMPLANT
SET HNDPC FAN SPRY TIP SCT (DISPOSABLE) IMPLANT
SPONGE GAUZE 4X4 12PLY (GAUZE/BANDAGES/DRESSINGS) ×2 IMPLANT
STAPLER VISISTAT 35W (STAPLE) ×2 IMPLANT
SUCTION FRAZIER TIP 10 FR DISP (SUCTIONS) IMPLANT
SUT VIC AB 0 CTB1 27 (SUTURE) IMPLANT
SUT VIC AB 1 CTX 36 (SUTURE)
SUT VIC AB 1 CTX36XBRD ANBCTR (SUTURE) IMPLANT
SYR 50ML LL SCALE MARK (SYRINGE) ×2 IMPLANT
TOWEL OR 17X24 6PK STRL BLUE (TOWEL DISPOSABLE) ×2 IMPLANT
TOWEL OR 17X26 10 PK STRL BLUE (TOWEL DISPOSABLE) ×2 IMPLANT
TRAY FOLEY CATH 16FRSI W/METER (SET/KITS/TRAYS/PACK) IMPLANT
WATER STERILE IRR 1000ML POUR (IV SOLUTION) ×2 IMPLANT
WRAP KNEE MAXI GEL POST OP (GAUZE/BANDAGES/DRESSINGS) ×2 IMPLANT

## 2013-05-19 NOTE — Transfer of Care (Signed)
Immediate Anesthesia Transfer of Care Note  Patient: Mason Patton  Procedure(s) Performed: Procedure(s) with comments: TOTAL KNEE ARTHROPLASTY (Left) - Left Total Knee Arthroplasty  Patient Location: PACU  Anesthesia Type:GA combined with regional for post-op pain  Level of Consciousness: sedated  Airway & Oxygen Therapy: Patient Spontanous Breathing and Patient connected to face mask oxygen  Post-op Assessment: Report given to PACU RN and Post -op Vital signs reviewed and stable  Post vital signs: Reviewed and stable  Complications: No apparent anesthesia complications

## 2013-05-19 NOTE — Anesthesia Preprocedure Evaluation (Addendum)
Anesthesia Evaluation  Patient identified by MRN, date of birth, ID band Patient awake    Reviewed: Allergy & Precautions, H&P , NPO status , Patient's Chart, lab work & pertinent test results  Airway Mallampati: III TM Distance: >3 FB Neck ROM: Full    Dental  (+) Teeth Intact and Dental Advisory Given   Pulmonary shortness of breath, sleep apnea and Continuous Positive Airway Pressure Ventilation , COPDformer smoker,          Cardiovascular hypertension, Pt. on medications + Valvular Problems/Murmurs     Neuro/Psych CVA, No Residual Symptoms    GI/Hepatic   Endo/Other  diabetesMorbid obesity  Renal/GU      Musculoskeletal   Abdominal   Peds  Hematology   Anesthesia Other Findings   Reproductive/Obstetrics                         Anesthesia Physical Anesthesia Plan  ASA: III  Anesthesia Plan: General   Post-op Pain Management:    Induction:   Airway Management Planned: Oral ETT  Additional Equipment:   Intra-op Plan:   Post-operative Plan: Extubation in OR  Informed Consent: I have reviewed the patients History and Physical, chart, labs and discussed the procedure including the risks, benefits and alternatives for the proposed anesthesia with the patient or authorized representative who has indicated his/her understanding and acceptance.   Dental advisory given  Plan Discussed with: CRNA, Surgeon and Anesthesiologist  Anesthesia Plan Comments:        Anesthesia Quick Evaluation

## 2013-05-19 NOTE — OR Nursing (Signed)
Reinforced  Knee drsg with 4 abd's  /kerlix d/t bleeding/ @ 200cc's

## 2013-05-19 NOTE — Anesthesia Procedure Notes (Addendum)
Anesthesia Regional Block:  Femoral nerve block  Pre-Anesthetic Checklist: ,, timeout performed, Correct Patient, Correct Site, Correct Laterality, Correct Procedure, Correct Position, site marked, Risks and benefits discussed, at surgeon's request and post-op pain management  Laterality: Left and Upper  Prep: chloraprep       Needles:  Injection technique: Single-shot  Needle Type: Echogenic Needle      Needle Gauge: 22 and 22 G  Needle insertion depth: 6 cm   Additional Needles:  Procedures: ultrasound guided (picture in chart) and nerve stimulator Femoral nerve block  Nerve Stimulator or Paresthesia:  Response: Twitch elicited, 0.8 mA,   Additional Responses:   Narrative:  Start time: 05/19/2013 8:05 AM End time: 05/19/2013 8:20 AM Injection made incrementally with aspirations every 5 mL.  Performed by: Personally  Anesthesiologist: Alma Friendly, MD  Additional Notes: BP cuff, EKG monitors applied. Sedation begun. Femoral artery palpated for location of nerve. After nerve location anesthetic injected incrementally, slowly , and after neg aspirations. Tolerated well.  Femoral nerve block Procedure Name: Intubation Date/Time: 05/19/2013 8:46 AM Performed by: Brien Mates D Pre-anesthesia Checklist: Patient identified, Emergency Drugs available, Suction available, Patient being monitored and Timeout performed Patient Re-evaluated:Patient Re-evaluated prior to inductionOxygen Delivery Method: Circle system utilized Preoxygenation: Pre-oxygenation with 100% oxygen Intubation Type: IV induction Ventilation: Mask ventilation without difficulty and Oral airway inserted - appropriate to patient size Laryngoscope Size: Miller and 2 Grade View: Grade II Tube type: Oral Tube size: 7.5 mm Number of attempts: 1 Airway Equipment and Method: Stylet Placement Confirmation: ETT inserted through vocal cords under direct vision,  breath sounds checked- equal and bilateral and  positive ETCO2 Secured at: 23 cm Tube secured with: Tape (very small laceration noted on upper lip) Dental Injury: Teeth and Oropharynx as per pre-operative assessment

## 2013-05-19 NOTE — Evaluation (Signed)
Physical Therapy Evaluation Patient Details Name: Mason Patton MRN: [id number] DOB: [birth year] Today's Date: 05/19/2013 Time: 4098-1191 PT Time Calculation (min): 32 min  PT Assessment / Plan / Recommendation History of Present Illness  s/p L TKA  Clinical Impression  Pt is s/p TKA resulting in the deficits listed below (see PT Problem List). Pt will benefit from skilled PT to increase their independence and safety with mobility to allow discharge to the venue listed below.     PT Assessment  Patient needs continued PT services    Follow Up Recommendations  Home health PT    Does the patient have the potential to tolerate intense rehabilitation      Barriers to Discharge        Equipment Recommendations  3in1 (PT)    Recommendations for Other Services OT consult   Frequency 7X/week    Precautions / Restrictions Precautions Precautions: Knee;Fall Restrictions Weight Bearing Restrictions: Yes LLE Weight Bearing: Weight bearing as tolerated   Pertinent Vitals/Pain Reported 2-3/10 pain w/ quad sets, 9/10 pain w/ end range knee flexion. Reported nausea after standing. RN provided medication to assist with nausea control.       Mobility  Bed Mobility Bed Mobility: Supine to Sit;Sitting - Scoot to [place] of Bed;Sit to Supine Supine to Sit: 4: Min assist;With rails;HOB elevated (+2 to support operative knee and prevent from buckling) Sitting - Scoot to Edge of Bed: 4: Min assist;With rail Sit to Supine: 4: Min assist;HOB elevated (pt needed assistance getting operative knee back into bed) Transfers Transfers: Sit to Stand;Stand to Sit Sit to Stand: 3: Mod assist;From bed;With upper extremity assist (+2 to prevent operative knee from buckling) Stand to Sit: 4: Min assist;With upper extremity assist;To bed Details for Transfer Assistance: cues for hand placement and to stand up straight. Noted knee buckle in L single limb stance requiring  blocking Ambulation/Gait Ambulation/Gait Assistance: 4: Min assist Ambulation Distance (Feet): 1 Feet Assistive device: Rolling walker Ambulation/Gait Assistance Details: side steps towards head of bed. Cueing and assistance needed to keep operative knee stable and to maintain standing.    Exercises General Exercises - Lower Extremity Quad Sets: AROM;Strengthening;10 reps;Supine;Left Heel Slides: AROM;Strengthening;Left;10 reps;Supine   PT Diagnosis: Acute pain  PT Problem List: Decreased activity tolerance;Decreased range of motion;Decreased strength;Decreased balance;Decreased mobility;Pain PT Treatment Interventions: Therapeutic activities;Therapeutic exercise;Patient/family education;Stair training;Gait training;Balance training     PT Goals(Current goals can be found in the care plan section) Acute Rehab PT Goals Patient Stated Goal: Return to work PT Goal Formulation: With patient/family Time For Goal Achievement: 05/21/13 Potential to Achieve Goals: Good  Visit Information  Last PT Received On: 05/19/13 History of Present Illness: s/p L TKA       Prior Functioning  Home Living Family/patient expects to be discharged to:: Private residence Living Arrangements: Spouse/significant other Available Help at Discharge: Available 24 hours/day Type of Home: House Home Access: Stairs to enter [employer] of Steps: 5-6 Entrance Stairs-Rails: Right;Left (cannot reach both) Home Equipment: Walker - 2 wheels Prior Function Level of Independence: Independent Communication Communication: No difficulties    Cognition  Cognition Arousal/Alertness: Awake/alert Behavior During Therapy: WFL for tasks assessed/performed Overall Cognitive Status: Within Functional Limits for tasks assessed    Extremity/Trunk Assessment Upper Extremity Assessment Upper Extremity Assessment: Defer to OT evaluation Lower Extremity Assessment Lower Extremity Assessment: Overall WFL for  tasks assessed   Balance Balance Balance Assessed: Yes Static Standing Balance Static Standing - Balance Support: Bilateral upper extremity supported Static Standing -  Level of Assistance: 5: Stand by assistance Static Standing - Comment/# of Minutes: stood for 2 minutes prior to getting dizzy  End of Session PT - End of Session Equipment Utilized During Treatment: Gait belt Activity Tolerance: Patient tolerated treatment well;Patient limited by pain Patient left: in bed;with call bell/phone within reach;with family/visitor present Nurse Communication: Other (comment) (pt requested meds for nausea)  GP     Ricky Stabs 05/19/2013, 4:44 PM

## 2013-05-19 NOTE — Preoperative (Signed)
Beta Blockers   Reason not to administer Beta Blockers:Not Applicable 

## 2013-05-19 NOTE — Progress Notes (Signed)
Orthopedic Tech Progress Note Patient Details:  Mason Patton 08-04-1945 [id number]  Ortho Devices Ortho Device/Splint Location: foot roll Ortho Device/Splint Interventions: Application   Cammer, Mickie Bail 05/19/2013, 12:03 PM

## 2013-05-19 NOTE — Progress Notes (Signed)
UR completed. Janitza Revuelta RN CCM Case Mgmt 

## 2013-05-19 NOTE — H&P (Signed)
TOTAL KNEE ADMISSION H&P  Patient is being admitted for left total knee arthroplasty.  Subjective:  Chief Complaint:left knee pain.  HPI: Mason Patton, 67 y.o. male, has a history of pain and functional disability in the left knee due to arthritis and has failed non-surgical conservative treatments for greater than 12 weeks to includeNSAID's and/or analgesics, corticosteriod injections and activity modification.  Onset of symptoms was gradual, starting 8 years ago with gradually worsening course since that time. The patient noted no past surgery on the left knee(s).  Patient currently rates pain in the left knee(s) at 8 out of 10 with activity. Patient has night pain, worsening of pain with activity and weight bearing, pain that interferes with activities of daily living, pain with passive range of motion, crepitus and joint swelling.  Patient has evidence of subchondral cysts, subchondral sclerosis, periarticular osteophytes and joint space narrowing by imaging studies. This patient has had Value of conservative care. There is no active infection.  Patient Active Problem List   Diagnosis Date Noted  . 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 Acute CVA (cerebrovascular accident) 03/29/2013  . Numbness and tingling 03/27/2013  . Hyperglycemia 03/26/2013  . Meningitis 03/25/2013  . Encephalopathy acute 03/23/2013  . Fever 03/23/2013  . OSA on CPAP 03/23/2013  . Obesity 03/23/2013  . Murmur 03/16/2013  . Preoperative cardiovascular examination 03/16/2013  . Dyslipidemia 03/16/2013  . Hypertension   . Thyroid disease   . Diabetes mellitus   . Cholecystitis with cholelithiasis    Past Medical History  Diagnosis Date  . Hypertension   . Diabetes mellitus   . Thyroid disease   . Diabetes mellitus     Type 2  . Gallstones and inflammation of gallbladder without obstruction   . Cholecystitis with cholelithiasis   . Arthritis   . Emphysema of lung   . Hyperlipidemia   . Eczema   .  Umbilical hernia   . Tobacco abuse     quit 11/08/2012  . Obesity   . History of nuclear stress test 02/23/2010    dipyridamole; normal pattern of perfusion in all regions; normal, low risk study   . Obstructive sleep apnea of adult     cpap    last sleep study >8 yrs  . Meningoencephalitis   . Shortness of breath     Past Surgical History  Procedure Laterality Date  . Inguinal hernia repair    . Knee arthroscopy    . Cholecystectomy  09/09/2011    Procedure: LAPAROSCOPIC CHOLECYSTECTOMY WITH INTRAOPERATIVE CHOLANGIOGRAM;  Surgeon: Cherylynn Ridges III, MD;  Location: [facility] OR;  Service: General;  Laterality: N/A;  . Transthoracic echocardiogram  03/28/2005    EF=>55%, mod conc LVH; LA mod dilated & borderline RA enlargement; mild TR; mild pulm valve regurg  . Joint replacement      rt knee arthroscopy    No prescriptions prior to admission   Allergies  Allergen Reactions  . Codeine Nausea And Vomiting  . Demerol Nausea And Vomiting    History  Substance Use Topics  . Smoking status: Former Smoker -- 0.50 packs/day for 50 years    Types: Cigarettes    Quit date: 11/08/2012  . Smokeless tobacco: Never Used  . Alcohol Use: No    Family History  Problem Relation Age of Onset  . Cancer Maternal Aunt     colon  . Cancer Maternal Aunt     lung  . Thyroid disease Mother   . Hyperlipidemia Father   .  Diabetes Maternal Grandmother   . Asthma Child   . Diabetes type I Child      Review of Systems  All other systems reviewed and are negative.    Objective:  Physical Exam  Vital signs in last 24 hours:    Labs:   Estimated body mass index is 38.88 kg/(m^2) as calculated from the following:   Height as of 03/23/13: 5\' 10"  (1.778 m).   Weight as of 04/20/13: 122.925 kg (271 lb).   Imaging Review Plain radiographs demonstrate moderate degenerative joint disease of the left knee(s). The overall alignment ismild varus. The bone quality appears to be satisfactory for age and  reported activity level.  Assessment/Plan:  End stage arthritis, left knee   The patient history, physical examination, clinical judgment of the provider and imaging studies are consistent with end stage degenerative joint disease of the left knee(s) and total knee arthroplasty is deemed medically necessary. The treatment options including medical management, injection therapy arthroscopy and arthroplasty were discussed at length. The risks and benefits of total knee arthroplasty were presented and reviewed. The risks due to aseptic loosening, infection, stiffness, patella tracking problems, thromboembolic complications and other imponderables were discussed. The patient acknowledged the explanation, agreed to proceed with the plan and consent was signed. Patient is being admitted for inpatient treatment for surgery, pain control, PT, OT, prophylactic antibiotics, VTE prophylaxis, progressive ambulation and ADL's and discharge planning. The patient is planning to be discharged home with home health services

## 2013-05-19 NOTE — Op Note (Signed)
OPERATIVE REPORT  DATE OF SURGERY: 05/19/2013  PATIENT:  Mason Patton,  67 y.o. male  PRE-OPERATIVE DIAGNOSIS:  Osteoarthritis left knee  POST-OPERATIVE DIAGNOSIS:  Osteoarthritis left knee  PROCEDURE:  Procedure(s): TOTAL KNEE ARTHROPLASTY Zimmer personalized knee system components. Size 11 femur. Size F. tibia. 32 mm patella. 12 mm polyethylene tray.   SURGEON:  Surgeon(s): Nadara Mustard, MD  ANESTHESIA:   local, regional and general  EBL:  Minimal ML  SPECIMEN:  No Specimen  TOURNIQUET:   Total Tourniquet Time Documented: Thigh (Left) - 49 minutes Total: Thigh (Left) - 49 minutes   PROCEDURE DETAILS: Patient is a 67 year old gentleman who presents with osteoarthritis with varus collapse of the left knee. He has failed conservative care and presents at this time for total knee arthroplasty. Risks and benefits were discussed including infection neurovascular injury pain DVT pulmonary embolus need for additional surgery. Patient states he understands and wishes to proceed at this time. Description of procedure patient brought to the operating room after undergoing a femoral block. He then underwent a general anesthetic. After adequate levels of anesthesia were obtained patient's left lower extremity was prepped using DuraPrep and draped into a sterile field Ioban was used to cover all exposed skin. A midline incision was made carried down to a medial parapatellar retinacular incision. Attention was first focused on the femur IM guide was used and distal cut was made at 5 of valgus and 10 mm off the femur. The patella was then cut to do to the large osteophytes and 10 mm was taken off the patella the patella was sized for a 32 mm polyethylene and the cuts were made for the patella. Attention was focused back on the femur this sized for a size 11 femur box cuts were made chamfer cuts were made attention was then focused on the tibia. External alignment guide was used 10 mm was  taken off the tibia with 3 posterior slope neutral varus valgus alignment. Trials were then placed and the knee was stable with the 12 mm polyethylene tray. The components removed the popliteal fossa was injected with exapril 60 cc. The knee was irrigated with pulsatile lavage. The tibial and femoral components were cemented in place this was cleansed of loose cement irrigated with pulsatile lavage and the 12 mm polyethylene tray was inserted. The patella was then placed and clamped in place all this cement was allowed to harden before the knee was moved. The knee was kept in extension. The knee was then placed a full range of motion and the patella tracked midline. The retinacular incision was closed with #1 Vicryl. The skin was closed using 0 Vicryl staples were applied the knee was covered with Adaptic orthopedic sponges AB dressing web roll and Coban. Patient was extubated taken to the PACU in stable condition.  PLAN OF CARE: Admit to inpatient   PATIENT DISPOSITION:  PACU - hemodynamically stable.   Nadara Mustard, MD 05/19/2013 10:12 AM

## 2013-05-19 NOTE — Evaluation (Signed)
Reviewed and agree;  Maayan Jenning, PT [phone]  

## 2013-05-19 NOTE — Anesthesia Postprocedure Evaluation (Signed)
  Anesthesia Post-op Note  Patient: Mason Patton  Procedure(s) Performed: Procedure(s) with comments: TOTAL KNEE ARTHROPLASTY (Left) - Left Total Knee Arthroplasty  Patient Location: PACU  Anesthesia Type:General and GA combined with regional for post-op pain  Level of Consciousness: awake and alert   Airway and Oxygen Therapy: Patient Spontanous Breathing  Post-op Pain: mild  Post-op Assessment: Post-op Vital signs reviewed  Post-op Vital Signs: stable  Complications: No apparent anesthesia complications

## 2013-05-20 LAB — GLUCOSE, CAPILLARY
Glucose-Capillary: 161 mg/dL — ABNORMAL HIGH (ref 70–99)
Glucose-Capillary: 203 mg/dL — ABNORMAL HIGH (ref 70–99)

## 2013-05-20 LAB — CBC
HCT: 30.8 % — ABNORMAL LOW (ref 39.0–52.0)
Hemoglobin: 10.8 g/dL — ABNORMAL LOW (ref 13.0–17.0)
MCV: 85.8 fL (ref 78.0–100.0)
Platelets: 168 10*3/uL (ref 150–400)
RBC: 3.59 MIL/uL — ABNORMAL LOW (ref 4.22–5.81)
RDW: 13.3 % (ref 11.5–15.5)
WBC: 10.3 10*3/uL (ref 4.0–10.5)

## 2013-05-20 LAB — BASIC METABOLIC PANEL
CO2: 26 mEq/L (ref 19–32)
Chloride: 100 mEq/L (ref 96–112)
Creatinine, Ser: 0.8 mg/dL (ref 0.50–1.35)
GFR calc Af Amer: >90 mL/min (ref >90)
Potassium: 4.1 mEq/L (ref 3.5–5.1)
Sodium: 137 mEq/L (ref 135–145)

## 2013-05-20 NOTE — Progress Notes (Addendum)
Physical Therapy Treatment Patient Details Name: Mason Patton MRN: [id number] DOB: 1946/03/24 Today's Date: 05/20/2013 Time: 7829-5621 PT Time Calculation (min): 35 min  PT Assessment / Plan / Recommendation  History of Present Illness s/p L TKA   PT Comments   Pt. Using footsie roll upon PT entry into room.  He was uncomfortable.  Educated pt. To use footsie roll as much as possible but to take breaks as needed.  During breaks he is instructed to work on left knee presses actively.  Making good gains this am with gait.  Follow Up Recommendations  Home health PT;Supervision/Assistance - 24 hour;Supervision for mobility/OOB     Does the patient have the potential to tolerate intense rehabilitation     Barriers to Discharge        Equipment Recommendations  3in1 (PT) RW (pt. Indicates he has standard walker at home that belonged to his father but prefers a RW.  RW is recommended by this therapist.   Recommendations for Other Services    Frequency 7X/week   Progress towards PT Goals Progress towards PT goals: Progressing toward goals  Plan Current plan remains appropriate    Precautions / Restrictions Precautions Precautions: Knee;Fall Restrictions Weight Bearing Restrictions: Yes LLE Weight Bearing: Weight bearing as tolerated Other Position/Activity Restrictions: Pt. in recliner with footsie roll in place upon PT entry.  Pt. reported discomfort in footsie roll.  He was instructed to use it as much as possible but to give himself breaks as needed and actively work on knee presses when out of footsie roll.   Pertinent Vitals/Pain See vitals tab     Mobility  Bed Mobility Bed Mobility: Not assessed (pt. up in recliner) Supine to Sit: 4: Min assist;HOB elevated;With rails (for leg) Sitting - Scoot to Edge of Bed: 4: Min assist (for leg) Transfers Transfers: Sit to Stand;Stand to Sit Sit to Stand: 4: Min guard;With armrests;With upper extremity assist;From  chair/3-in-1 Stand to Sit: 4: Min assist;With upper extremity assist;With armrests;To chair/3-in-1 Details for Transfer Assistance: vc's fopr reinforcing correct hand placement and min guard assist especially to control descent to recliner Ambulation/Gait Ambulation/Gait Assistance: 4: Min guard Ambulation Distance (Feet): 80 Feet Assistive device: Rolling walker Ambulation/Gait Assistance Details: cues for knee extension in stance and for heel strike Gait Pattern: Step-to pattern Gait velocity: decreased    Exercises Total Joint Exercises Ankle Circles/Pumps: AROM;Both;10 reps;Seated Quad Sets: AROM;Both;10 reps;Seated Short Arc Quad: AAROM;Left;10 reps;Seated Long Arc Quad: AAROM;Left;10 reps;Seated Knee Flexion: AROM;Left;10 reps;Seated Goniometric ROM: -10 to 70   PT Diagnosis:    PT Problem List:   PT Treatment Interventions:     PT Goals (current goals can now be found in the care plan section) Acute Rehab PT Goals Patient Stated Goal: Home tomorrow  Visit Information  Last PT Received On: 05/20/13 Assistance Needed: +1 History of Present Illness: s/p L TKA    Subjective Data  Patient Stated Goal: Home tomorrow   Cognition  Cognition Arousal/Alertness: Awake/alert Behavior During Therapy: WFL for tasks assessed/performed Overall Cognitive Status: Within Functional Limits for tasks assessed    Balance     End of Session PT - End of Session Equipment Utilized During Treatment: Gait belt Activity Tolerance: Patient tolerated treatment well;Patient limited by pain Patient left: in chair;with call bell/phone within reach Nurse Communication: Mobility status   GP     Ferman Hamming 05/20/2013, 10:23 AM Weldon Picking PT Acute Rehab Services [phone] [place] [phone]

## 2013-05-20 NOTE — Progress Notes (Signed)
05/20/13 Set up with HHPT with [facility] by MD office. Spoke with patient and his wife, no change in discharge plan. Patient will need rolling walker and 3N1. Contacted Brent with [employer] and requested rolling walker and 3N1.No other discharge needs identified. Jacquelynn Cree RN

## 2013-05-20 NOTE — Progress Notes (Signed)
Patient ID: Mason Patton, male   DOB: Dec 31, 1945, 67 y.o.   MRN: [id number] Postoperative day 1 left total knee arthroplasty. Labs pending. Patient is comfortable. Continue physical therapy today patient wishes to be discharged on Friday.

## 2013-05-20 NOTE — Evaluation (Signed)
Occupational Therapy Evaluation and Discharge Patient Details Name: Mason Patton MRN: [id number] DOB: [birth year] Today's Date: 05/20/2013 Time: 4098-1191 OT Time Calculation (min): 36 min  OT Assessment / Plan / Recommendation History of present illness s/p L TKA   Clinical Impression   This 67 yo male s/p above presents to acute OT with all education completed, will D/C from acute OT.    OT Assessment  Patient does not need any further OT services    Follow Up Recommendations  No OT follow up       Equipment Recommendations  None recommended by OT          Precautions / Restrictions Precautions Precautions: Knee;Fall Restrictions Weight Bearing Restrictions: Yes LLE Weight Bearing: Weight bearing as tolerated   Pertinent Vitals/Pain 5/10 with activity; repositioned    ADL  Equipment Used: Gait belt;Rolling walker Transfers/Ambulation Related to ADLs: min guard A for all with RW ADL Comments: Pt needs A currently with LBB/D and his wife can A prn until he can do it himself. Demonstrated to pt how he would step into and out of shower stall and he verbalized understanding       Acute Rehab OT Goals Patient Stated Goal: Home tomorrow  Visit Information  Last OT Received On: 05/20/13 Assistance Needed: +1 History of Present Illness: s/p L TKA       Prior Functioning     Home Living Family/patient expects to be discharged to:: Private residence Living Arrangements: Spouse/significant other Available Help at Discharge: Available 24 hours/day Type of Home: House Home Access: Stairs to enter [employer] of Steps: 5-6 Entrance Stairs-Rails: Right;Left Home Layout: One level Home Equipment: Environmental consultant - 2 wheels Prior Function Level of Independence: Independent Comments: HVAC work; Therapist, 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[employer], Armed forces operational officer Communication: No difficulties Dominant Hand: Right         Vision/Perception Vision - History Patient Visual  Report: No change from baseline   Cognition  Cognition Arousal/Alertness: Awake/alert Behavior During Therapy: WFL for tasks assessed/performed Overall Cognitive Status: Within Functional Limits for tasks assessed    Extremity/Trunk Assessment Upper Extremity Assessment Upper Extremity Assessment: Overall WFL for tasks assessed     Mobility Bed Mobility Bed Mobility: Supine to Sit;Sitting - Scoot to Edge of Bed Supine to Sit: 4: Min assist;HOB elevated;With rails (for leg) Sitting - Scoot to Edge of Bed: 4: Min assist (for leg) Transfers Transfers: Sit to Stand;Stand to Sit Sit to Stand: 4: Min guard;With upper extremity assist;From bed Stand to Sit: 4: Min guard;With upper extremity assist;With armrests;To chair/3-in-1 Details for Transfer Assistance: VCs for safe hand placement           End of Session OT - End of Session Equipment Utilized During Treatment: Gait belt;Rolling walker Activity Tolerance: Patient tolerated treatment well Patient left: in chair;with call bell/phone within reach        Evette Georges [phone] 05/20/2013, 10:12 AM

## 2013-05-20 NOTE — Progress Notes (Signed)
05/20/13 Received call from [place] with [employer], they are notworking with this patient for DME. Contacted Darian with [facility] and requested rolling walker and 3N1 be delivered to patient's room prior to discharge.Jacquelynn Cree RN, BSN, CCM  05/20/13 Set up with HHPT with [facility] by MD office. Spoke with patient and his wife, no change in discharge plan. Patient will need rolling walker and 3N1. Contacted [place] with [employer] and requested rolling walker and 3N1.No other discharge needs identified. Jacquelynn Cree RN

## 2013-05-20 NOTE — Progress Notes (Signed)
Physical Therapy Treatment Patient Details Name: Mason Patton MRN: [id number] DOB: 05/16/1946 Today's Date: 05/20/2013 Time: 0865-7846 PT Time Calculation (min): 39 min  PT Assessment / Plan / Recommendation  History of Present Illness s/p L TKA   PT Comments   Pt. Appears more fatigued this afternoon but motivated to do all he can in mobility and exercise.  Follow Up Recommendations  Home health PT;Supervision/Assistance - 24 hour;Supervision for mobility/OOB     Does the patient have the potential to tolerate intense rehabilitation     Barriers to Discharge        Equipment Recommendations  3in1 (PT)    Recommendations for Other Services    Frequency 7X/week   Progress towards PT Goals Progress towards PT goals: Progressing toward goals  Plan Current plan remains appropriate    Precautions / Restrictions Precautions Precautions: Knee;Fall Restrictions Weight Bearing Restrictions: Yes LLE Weight Bearing: Weight bearing as tolerated .   Pertinent Vitals/Pain See vitals tab    Mobility  Bed Mobility Bed Mobility: Supine to Sit;Sitting - Scoot to Edge of Bed Supine to Sit: 4: Min assist;With rails;HOB elevated Sitting - Scoot to [place] of Bed: 4: Min assist Details for Bed Mobility Assistance: needed use of bed sheet and min assist to transition to edge of bed Transfers Transfers: Sit to Stand;Stand to Sit Sit to Stand: 4: Min guard;From bed;From elevated surface;With upper extremity assist Stand to Sit: 4: Min assist;With upper extremity assist;To chair/3-in-1;With armrests Details for Transfer Assistance: vc's for hand placment and min assist for translating up to stand Ambulation/Gait Ambulation/Gait Assistance: 4: Min guard Ambulation Distance (Feet): 50 Feet Assistive device: Rolling walker Ambulation/Gait Assistance Details: Needed increased time this pm to ambulate a shorter distance, limited by pain and fatigue.  Improved foot flat for stance phase Gait  Pattern: Step-to pattern Gait velocity: decreased    Exercises Total Joint Exercises Ankle Circles/Pumps: AROM;Both;10 reps;Seated Quad Sets: AROM;Both;10 reps;Seated Short Arc Quad: AAROM;Left;10 reps;Supine Heel Slides: AAROM;Left;10 reps;Supine Hip ABduction/ADduction: AROM;Left;10 reps;Supine Straight Leg Raises: AAROM;Left;10 reps;Supine Long Arc Quad: AAROM;Left;10 reps;Seated Goniometric ROM: -10 to 70   PT Diagnosis:    PT Problem List:   PT Treatment Interventions:     PT Goals (current goals can now be found in the care plan section)    Visit Information  Last PT Received On: 05/20/13 Assistance Needed: +1 History of Present Illness: s/p L TKA    Subjective Data  Subjective: "want to sit in the chair for a while"   Cognition  Cognition Arousal/Alertness: Awake/alert Behavior During Therapy: WFL for tasks assessed/performed Overall Cognitive Status: Within Functional Limits for tasks assessed    Balance     End of Session PT - End of Session Equipment Utilized During Treatment: Gait belt Activity Tolerance: Patient tolerated treatment well;Patient limited by fatigue;Patient limited by pain Patient left: in chair;with call bell/phone within reach;with family/visitor present Nurse Communication: Mobility status   GP     Ferman Hamming 05/20/2013, 4:14 PM Weldon Picking PT Acute Rehab Services [phone] [facility] [phone]

## 2013-05-21 LAB — GLUCOSE, CAPILLARY: Glucose-Capillary: 149 mg/dL — ABNORMAL HIGH (ref 70–99)

## 2013-05-21 LAB — CBC
HCT: 28.5 % — ABNORMAL LOW (ref 39.0–52.0)
Hemoglobin: 9.6 g/dL — ABNORMAL LOW (ref 13.0–17.0)
MCH: 29.1 pg (ref 26.0–34.0)
MCHC: 33.7 g/dL (ref 30.0–36.0)
MCV: 86.4 fL (ref 78.0–100.0)
Platelets: 156 10*3/uL (ref 150–400)
RBC: 3.3 MIL/uL — ABNORMAL LOW (ref 4.22–5.81)

## 2013-05-21 MED ORDER — OXYCODONE-ACETAMINOPHEN 5-325 MG PO TABS
1.0000 | ORAL_TABLET | ORAL | Status: DC | PRN
Start: 2013-05-21 — End: 2013-10-29

## 2013-05-21 NOTE — Progress Notes (Signed)
Physical Therapy Treatment Patient Details Name: Mason Patton MRN: [id number] DOB: 18-Sep-1945 Today's Date: 05/21/2013 Time: 1191-4782 PT Time Calculation (min): 51 min  PT Assessment / Plan / Recommendation  History of Present Illness s/p L TKA   PT Comments   Pt. Made good gains this am with PT in regards to improved gait .  Wife present and was educated on supervision to min assist for stability (level surfaces and steps).  He desires to go home this am and wife is supportive in this.  No overt LOB noted and was able to get up and down steps with 1 min assist. Appears to be ready for Dc with continuation of rehab via HHPT for maximum independence.  Follow Up Recommendations  Home health PT;Supervision/Assistance - 24 hour;Supervision for mobility/OOB     Does the patient have the potential to tolerate intense rehabilitation     Barriers to Discharge        Equipment Recommendations  3in1 (PT);Other (comment) (pt. declines this due to "not comfortable " to him)    Recommendations for Other Services    Frequency 7X/week   Progress towards PT Goals Progress towards PT goals: Progressing toward goals  Plan Current plan remains appropriate    Precautions / Restrictions Precautions Precautions: Knee;Fall Restrictions Weight Bearing Restrictions: Yes LLE Weight Bearing: Weight bearing as tolerated   Pertinent Vitals/Pain See vitals tab    Mobility  Bed Mobility Bed Mobility: Supine to Sit;Sitting - Scoot to Edge of Bed Supine to Sit: 5: Supervision;With rails;HOB flat Sitting - Scoot to Edge of Bed: 5: Supervision Sit to Supine: Not Tested (comment) Details for Bed Mobility Assistance: supervision this am with use of bed rail Transfers Transfers: Sit to Stand;Stand to Sit Sit to Stand: 5: Supervision;From bed;With upper extremity assist;From chair/3-in-1 Stand to Sit: 4: Min guard;With upper extremity assist;With armrests;To chair/3-in-1 Details for Transfer  Assistance: One verbal reminder for hand placement before standing; min guard assist to control descent to recliner Ambulation/Gait Ambulation/Gait Assistance: 5: Supervision Ambulation Distance (Feet): 125 Feet Assistive device: Rolling walker Ambulation/Gait Assistance Details: Still requiring increased time to ambulate but able to tolerate increased distance .  Doing an improved job of straightening knee and placing left foot flat in stance.No overt LOB noted and good technique demonstrated by pt. Gait Pattern: Step-to pattern Gait velocity: decreased Stairs: Yes Stairs Assistance: 4: Min assist Stairs Assistance Details (indicate cue type and reason): min assist and one rail technique; pt. verbalizes clear understanding  and wife was present to observe technique. Stair Management Technique: One rail Left Number of Stairs: 2    Exercises Total Joint Exercises Ankle Circles/Pumps: AROM;Both;10 reps;Supine Quad Sets: AROM;Both;10 reps;Supine Short Arc Quad: AROM;10 reps;Supine;Left Straight Leg Raises: AROM;Left;10 reps;Supine Knee Flexion: AROM;Left;10 reps;Seated Goniometric ROM: ~ 0 to 75   PT Diagnosis:    PT Problem List:   PT Treatment Interventions:     PT Goals (current goals can now be found in the care plan section)    Visit Information  Last PT Received On: 05/21/13 Assistance Needed: +1 History of Present Illness: s/p L TKA    Subjective Data  Subjective: "I want to go home this morning" ( after his PT  session was completed)   Cognition  Cognition Arousal/Alertness: Awake/alert Behavior During Therapy: WFL for tasks assessed/performed Overall Cognitive Status: Within Functional Limits for tasks assessed    Balance     End of Session PT - End of Session Equipment Utilized During Treatment: Gait belt  Activity Tolerance: Patient tolerated treatment well Patient left: in chair;with call bell/phone within reach;with family/visitor present Nurse Communication:  Mobility status   GP     Ferman Hamming 05/21/2013, 1:41 PM Weldon Picking PT Acute Rehab Services [phone] [facility] [phone]

## 2013-05-21 NOTE — Progress Notes (Signed)
Patient d/c to home with family.  IV removed, prescriptions given, instructions reviewed. 

## 2013-05-21 NOTE — Discharge Summary (Signed)
Physician Discharge Summary  Patient ID: Mason Patton MRN: [id number] DOB/AGE: [birth year] 67 y.o.  Admit date: 05/19/2013 Discharge date: 05/21/2013  Admission Diagnoses: Osteoarthritis left knee  Discharge Diagnoses: Osteoarthritis left knee Active Problems:   * No active hospital problems. *   Discharged Condition: stable  Hospital Course: Patient's hospital course was essentially unremarkable. He underwent total knee arthroplasty progressed well and was discharged to home in stable condition.  Consults: None  Significant Diagnostic Studies: labs: Routine labs  Treatments: surgery: See operative note  Discharge Exam: Blood pressure 127/55, pulse 77, temperature 98.8 F (37.1 C), temperature source Oral, resp. rate 18, SpO2 97.00%. Incision/Wound: incision clean dry and intact  Disposition: 06-Home-Health Care Svc  Discharge Orders   Future Appointments Provider Department Dept Phone   06/01/2013 2:45 PM Micki Riley, MD [facility] [phone]   Future Orders Complete By Expires   Call MD / Call 911  As directed    Comments:     If you experience chest pain or shortness of breath, CALL 911 and be transported to the hospital emergency room.  If you develope a fever above 101 F, pus (white drainage) or increased drainage or redness at the wound, or calf pain, call your surgeon's office.   Change dressing  As directed    Scheduling Instructions:     Change dressing to Mepilex dressing prior to discharge   Constipation Prevention  As directed    Comments:     Drink plenty of fluids.  Prune juice may be helpful.  You may use a stool softener, such as Colace (over the counter) 100 mg twice a day.  Use MiraLax (over the counter) for constipation as needed.   Diet - low sodium heart healthy  As directed    Increase activity slowly as tolerated  As directed        Medication List         albuterol 108 (90 BASE) MCG/ACT inhaler  Commonly known  as:  PROVENTIL HFA;VENTOLIN HFA  Inhale 2 puffs into the lungs every 4 (four) hours as needed for wheezing or shortness of breath.     amLODipine 10 MG tablet  Commonly known as:  NORVASC  Take 10 mg by mouth daily.     atorvastatin 40 MG tablet  Commonly known as:  LIPITOR  Take 1 tablet (40 mg total) by mouth daily.     benazepril 40 MG tablet  Commonly known as:  LOTENSIN  Take 40 mg by mouth 2 (two) times daily.     clopidogrel 75 MG tablet  Commonly known as:  PLAVIX  Take 1 tablet (75 mg total) by mouth daily with breakfast.     fexofenadine 180 MG tablet  Commonly known as:  ALLEGRA  Take 180 mg by mouth daily as needed for allergies.     glyBURIDE-metformin 5-500 MG per tablet  Commonly known as:  GLUCOVANCE  Take 1 tablet by mouth 3 (three) times daily.     levothyroxine 125 MCG tablet  Commonly known as:  SYNTHROID, LEVOTHROID  Take 2 tablets (250 mcg total) by mouth daily before breakfast.     metoprolol succinate 100 MG 24 hr tablet  Commonly known as:  TOPROL-XL  Take 100 mg by mouth daily.     multivitamin with minerals Tabs tablet  Take 1 tablet by mouth daily.     oxyCODONE-acetaminophen 5-325 MG per tablet  Commonly known as:  ROXICET  Take 1 tablet by mouth every  4 (four) hours as needed for severe pain.     triamcinolone ointment 0.1 %  Commonly known as:  KENALOG  Apply 1 application topically 2 (two) times daily as needed (rash).           Follow-up Information   Follow up with Madicyn Mesina V, MD In 2 weeks.   Specialty:  Orthopedic Surgery   Contact information:   [address] [place] [address] [phone]       Signed: Nadara Mustard 05/21/2013, 6:44 AM

## 2013-05-24 ENCOUNTER — Encounter (HOSPITAL_COMMUNITY): Payer: Self-pay | Admitting: Orthopedic Surgery

## 2013-05-26 ENCOUNTER — Ambulatory Visit (HOSPITAL_COMMUNITY)
Admission: RE | Admit: 2013-05-26 | Discharge: 2013-05-26 | Disposition: A | Discharge status: Home / Self Care | Payer: 59 | Source: Ambulatory Visit | Attending: Orthopedic Surgery | Admitting: Orthopedic Surgery

## 2013-05-26 ENCOUNTER — Other Ambulatory Visit (HOSPITAL_COMMUNITY): Payer: Self-pay | Admitting: Orthopedic Surgery

## 2013-05-26 DIAGNOSIS — M25562 Pain in left knee: Secondary | ICD-10-CM

## 2013-05-26 DIAGNOSIS — Z9889 Other specified postprocedural states: Secondary | ICD-10-CM | POA: Insufficient documentation

## 2013-05-26 DIAGNOSIS — M79609 Pain in unspecified limb: Secondary | ICD-10-CM | POA: Insufficient documentation

## 2013-05-26 DIAGNOSIS — M7989 Other specified soft tissue disorders: Secondary | ICD-10-CM

## 2013-05-26 NOTE — Progress Notes (Signed)
*  PRELIMINARY RESULTS* Vascular Ultrasound Lower extremity venous duplex Left has been completed.  Preliminary findings: No obvious evidence of DVT noted. Difficult to fully evaluate calf veins due to edema.    Called report to Autumn at Dr. Audrie Lia office.    Farrel Demark, RDMS, RVT  05/26/2013, 4:51 PM

## 2013-06-01 ENCOUNTER — Ambulatory Visit (INDEPENDENT_AMBULATORY_CARE_PROVIDER_SITE_OTHER): Payer: 59 | Admitting: Neurology

## 2013-06-01 ENCOUNTER — Encounter: Payer: Self-pay | Admitting: Neurology

## 2013-06-01 VITALS — BP 130/73 | HR 78 | Temp 97.9°F | Ht 70.0 in | Wt 269.0 lb

## 2013-06-01 DIAGNOSIS — I6350 Cerebral infarction due to unspecified occlusion or stenosis of unspecified cerebral artery: Secondary | ICD-10-CM

## 2013-06-01 DIAGNOSIS — I6381 Other cerebral infarction due to occlusion or stenosis of small artery: Secondary | ICD-10-CM | POA: Insufficient documentation

## 2013-06-01 DIAGNOSIS — I779 Disorder of arteries and arterioles, unspecified: Secondary | ICD-10-CM | POA: Insufficient documentation

## 2013-06-01 DIAGNOSIS — I6789 Other cerebrovascular disease: Secondary | ICD-10-CM

## 2013-06-01 DIAGNOSIS — I4891 Unspecified atrial fibrillation: Secondary | ICD-10-CM

## 2013-06-01 NOTE — Patient Instructions (Signed)
Continue Plavix for secondary stroke prevention with strict control of diabetes with hemoglobin A1c goal below 6.5%, hypertension with blood pressure goal below 130/90, lipids with LDL cholesterol goal below 70 mg percent and use CPAP every night for sleep apnea. Check life watch monitor for 3 weeks for paroxysmal atrial fibrillation. Return for followup in 3 months with Heide Guile, NP or call earlier if necessary.

## 2013-06-01 NOTE — Progress Notes (Signed)
[facility] [address] [place]. [place] [address] [phone]       OFFICE CONSULT NOTE  Mr. 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 Spinal tap showed mild elevated protein and csf lymphocytosis. CSF grew no organism and viral titres were negative except elevated [place] virus IgG.MRI brain showed tiny 3 mm left frontal DWI positive lesion and several white matter T 2 shine thru hyperintensities in right corona radiata and occipital lobes consisted with old silent infarcts. He denied any focal left brain symptoms or prior h/o stroke or TIAs.He was started on plavix which he is tolerating well.He has multiple vascular risk factors of HT, diabetes, lipids, obesity and sleep apnoea. Carotid dopplers, intracranial MRA, echo were normal.He has remote h/o transient atrial fibrillation in setting of thyroid dysfunction 10 years ago.  ROS:   14 system review of systems is positive for knee pain, swelling, gait difficulty and hearing loss  PMH:  Past Medical History  Diagnosis Date  . Hypertension   . Thyroid disease   . Gallstones and inflammation of gallbladder without obstruction   . Cholecystitis with cholelithiasis   . Arthritis   . Emphysema of lung   . Hyperlipidemia   . Eczema   . Umbilical hernia   . Tobacco abuse     quit 11/08/2012  . Obesity   . History of nuclear stress test 02/23/2010    dipyridamole; normal pattern of perfusion in all regions; normal, low risk study   . Meningoencephalitis   . Shortness of breath   . Heart murmur   . Pneumonia     "twice"  (05/19/2013)  . OSA on CPAP     last sleep study >8 yrs  . 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 Type II diabetes mellitus   . Stroke     "they said I had a minor one when I had menigitis" (05/19/2013)    Social History:  History   Social History  . Marital Status: Married    Spouse Name: N/A    Number of Children: 3  . Years of Education: tech   Occupational History  .     Social History Main Topics  . Smoking status: Former Smoker -- 0.50 packs/day for 50 years    Types: Cigarettes    Quit date: 11/08/2012  . Smokeless tobacco: Never Used  . Alcohol Use: No  . Drug Use: No  . Sexual Activity: Yes   Other Topics Concern  . Not on file   Social History Narrative  . No narrative on file    Medications:   Current Outpatient Prescriptions on File Prior to Visit  Medication Sig Dispense Refill  . albuterol (PROVENTIL HFA;VENTOLIN HFA) 108 (90 BASE) MCG/ACT inhaler Inhale 2 puffs into the lungs every 4 (four) hours as needed for wheezing or shortness of breath.      [name] amLODipine (NORVASC) 10 MG tablet Take 10 mg by mouth daily.      [name] atorvastatin (LIPITOR) 40 MG tablet Take 1 tablet (40 mg total) by mouth daily.  30 tablet  0  . benazepril (LOTENSIN) 40 MG tablet Take 40 mg by mouth 2 (two) times daily.      . clopidogrel (PLAVIX) 75 MG tablet Take 1 tablet (75 mg total) by mouth daily with breakfast.  30 tablet  0  . fexofenadine (ALLEGRA) 180 MG tablet Take 180 mg by mouth daily as needed for allergies.       [name] glyBURIDE-metformin (GLUCOVANCE) 5-500 MG per tablet Take 1 tablet by mouth 3 (three) times daily.       [name] levothyroxine (SYNTHROID, LEVOTHROID) 125 MCG tablet Take 2 tablets (250 mcg total) by mouth daily before breakfast.  60 tablet  0  . metoprolol succinate (TOPROL-XL) 100 MG 24 hr tablet Take 100 mg by mouth daily.       . Multiple Vitamin (MULTIVITAMIN WITH MINERALS) TABS tablet Take 1 tablet by mouth daily.      [name]  oxyCODONE-acetaminophen (ROXICET) 5-325 MG per tablet Take 1 tablet by mouth every 4 (four) hours as needed for severe pain.  60 tablet  0  . triamcinolone ointment (KENALOG) 0.1 % Apply 1 application topically 2 (two) times daily as needed (rash).       No current facility-administered medications on file prior to visit.    Allergies:   Allergies  Allergen Reactions  . Codeine Nausea And Vomiting  . Demerol Nausea And Vomiting    Physical Exam General: obese middle aged Caucasian male seated, in no evident distress Head: head normocephalic and atraumatic. Orohparynx benign Neck: supple with no carotid or supraclavicular bruits Cardiovascular: regular rate and rhythm, no murmurs Musculoskeletal: left knee pain,swelling and  deformity Skin:  no rash/petichiae Vascular:  Normal pulses all extremities Filed Vitals:   06/01/13 1501  BP: 130/73  Pulse: 78  Temp: 97.9 F (36.6 C)    Neurologic Exam Mental Status: Awake and fully alert. Oriented to place and time. Recent and remote memory intact. Attention span, concentration and fund of knowledge appropriate. Mood and affect appropriate.  Cranial Nerves: Fundoscopic exam reveals sharp disc margins. Pupils equal, briskly reactive to light. Extraocular movements full without nystagmus. Visual fields full to confrontation. Hearing intact. Facial sensation intact. Face, tongue, palate moves normally and symmetrically.  Motor: Normal bulk and tone. Normal strength in all tested extremity muscles. Sensory.: intact to touch and pinprick and vibratory sensation.  Coordination: Rapid alternating movements normal in all extremities. Finger-to-nose and heel-to-shin performed accurately bilaterally. Gait and Station: Arises from chair without difficulty. Stance is normal. Gait demonstrates normal stride length and balance . Uses a walker due to pain in left knee from recent surgery Reflexes: 1+ and symmetric. Toes downgoing.   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 Vascular risk factors of Diabetes, Hypertension, Hyperlipidimia, Sleep Apnoea, Obesity    PLAN: I had along discussion with patient and wife with regards to silent infarcts, small vessel disease, stroke risk factors and risk factor modification and answered questions. Continue Plavix for secondary stroke prevention with strict control of diabetes with hemoglobin A1c goal below 6.5%, hypertension with blood pressure goal below 130/90, lipids with LDL cholesterol goal below 70 mg percent and use CPAP every night for sleep apnea. Check life watch monitor for 3 weeks for paroxysmal atrial fibrillation. Return for followup in 3 months with Heide Guile, NP or call earlier if  necessary.

## 2013-06-11 ENCOUNTER — Encounter: Payer: Self-pay | Admitting: Radiology

## 2013-06-11 ENCOUNTER — Encounter (INDEPENDENT_AMBULATORY_CARE_PROVIDER_SITE_OTHER): Payer: 59

## 2013-06-11 DIAGNOSIS — I4891 Unspecified atrial fibrillation: Secondary | ICD-10-CM

## 2013-06-11 NOTE — Progress Notes (Signed)
Patient ID: Mason Patton, male   DOB: 08-17-1945, 67 y.o.   MRN: [id number] [employer] 30 day monitor applied

## 2013-09-21 ENCOUNTER — Ambulatory Visit: Payer: 59 | Admitting: Nurse Practitioner

## 2013-10-04 ENCOUNTER — Telehealth: Payer: Self-pay | Admitting: Neurology

## 2013-10-04 NOTE — Telephone Encounter (Signed)
Pt had stroke seen Dr. Pearlean Brownie in Nov. Needed a 3 mth f/u was scheduled in March, cancelled due to provider then r/s in April, cancelled due to pt was scheduled in error in the time slot for lunch w/LL. Pt is tired of being r/s and would like for someone to call him to get him in before July to see Dr. Pearlean Brownie or NP/LL. Thanks

## 2013-10-04 NOTE — Telephone Encounter (Signed)
Called pt to make an appt on 11/04/13 with Larita Fife, NP. I advised the pt that if he has any other problems, questions or concerns to call the office. Pt verbalized understanding.

## 2013-10-06 ENCOUNTER — Ambulatory Visit: Payer: 59 | Admitting: Nurse Practitioner

## 2013-10-29 ENCOUNTER — Ambulatory Visit (INDEPENDENT_AMBULATORY_CARE_PROVIDER_SITE_OTHER): Payer: Medicare Other | Admitting: Nurse Practitioner

## 2013-10-29 ENCOUNTER — Encounter (INDEPENDENT_AMBULATORY_CARE_PROVIDER_SITE_OTHER): Payer: Self-pay

## 2013-10-29 ENCOUNTER — Encounter: Payer: Self-pay | Admitting: Nurse Practitioner

## 2013-10-29 VITALS — BP 153/70 | HR 63 | Ht 70.5 in | Wt 284.0 lb

## 2013-10-29 DIAGNOSIS — I6789 Other cerebrovascular disease: Secondary | ICD-10-CM

## 2013-10-29 NOTE — Patient Instructions (Signed)
Continue Plavix for secondary stroke prevention with strict control of hypertension with blood pressure goal below 140/90, lipids with LDL cholesterol goal below 70 mg percent and use CPAP every night for sleep apnea.  Return for followup in 6 months or call earlier if necessary.

## 2013-10-29 NOTE — Progress Notes (Signed)
PATIENT: Mason Patton DOB: [birth year]  REASON FOR VISIT: routine stroke follow up HISTORY FROM: patient  HISTORY OF PRESENT ILLNESS: 53 year Caucasian male who was admitted on 03/22/13 with 1 week h/o headache being treated by primary MD as sinusitis with amoxcicillin and allegra. 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 Spinal tap showed mild elevated protein and csf lymphocytosis. CSF grew no organism and viral titres were negative except elevated [place] virus IgG. MRI brain showed tiny 3 mm left frontal DWI positive lesion and several white matter T 2 shine thru hyperintensities in right corona radiata and occipital lobes consisted with old silent infarcts. He denied any focal left brain symptoms or prior h/o stroke or TIAs.He was started on plavix which he is tolerating well.He has multiple vascular risk factors of HT, diabetes, lipids, obesity and sleep apnoea. Carotid dopplers, intracranial MRA, echo were normal.He has remote h/o transient atrial fibrillation in setting of thyroid dysfunction 10 years ago.   UPDATE 10/29/13 (LL): Mason Patton returns for stroke follow up.  His 3 week cardiac monitor did not show evidence of atrial fibrillation.  He states he feels well and is back to work.  He states his blood pressure is well controlled, but it is elevated in the office today at 153/70.  He does not feel like he has any residual effects from the stroke.  He is tolerating Plavix well with no signs of significant bleeding or bruising.  He wears his Cpap each night but states he wakes frequently.  He unfortunately started back smoking again.  ROS:  14 system review of systems is positive forhearing loss, eye redness, wheezing, frequency of urination, bruise easily.and hearing loss  ALLERGIES: Allergies  Allergen Reactions  . Codeine Nausea And Vomiting  . Demerol Nausea And Vomiting    HOME  MEDICATIONS: Outpatient Prescriptions Prior to Visit  Medication Sig Dispense Refill  . albuterol (PROVENTIL HFA;VENTOLIN HFA) 108 (90 BASE) MCG/ACT inhaler Inhale 2 puffs into the lungs every 4 (four) hours as needed for wheezing or shortness of breath.      [name] amLODipine (NORVASC) 10 MG tablet Take 10 mg by mouth daily.      [name] atorvastatin (LIPITOR) 40 MG tablet Take 1 tablet (40 mg total) by mouth daily.  30 tablet  0  . benazepril (LOTENSIN) 40 MG tablet Take 40 mg by mouth 2 (two) times daily.      . clopidogrel (PLAVIX) 75 MG tablet Take 1 tablet (75 mg total) by mouth daily with breakfast.  30 tablet  0  . fexofenadine (ALLEGRA) 180 MG tablet Take 180 mg by mouth daily as needed for allergies.       [name] glyBURIDE-metformin (GLUCOVANCE) 5-500 MG per tablet Take 1 tablet by mouth 3 (three) times daily.       . Multiple Vitamin (MULTIVITAMIN WITH MINERALS) TABS tablet Take 1 tablet by mouth daily.      [name] triamcinolone ointment (KENALOG) 0.1 % Apply 1 application topically 2 (two) times daily as needed (rash).      . metoprolol succinate (TOPROL-XL) 100 MG 24 hr tablet Take 100 mg by mouth daily.       [name] levothyroxine (SYNTHROID, LEVOTHROID) 125 MCG tablet Take 2 tablets (250 mcg total) by mouth daily before breakfast.  60 tablet  0  . methocarbamol (ROBAXIN) 500 MG tablet       . oxyCODONE-acetaminophen (ROXICET) 5-325 MG per tablet Take  1 tablet by mouth every 4 (four) hours as needed for severe pain.  60 tablet  0   No facility-administered medications prior to visit.     PHYSICAL EXAM  Filed Vitals:   10/29/13 1508  BP: 153/70  Pulse: 63  Height: 5' 10.5" (1.791 m)  Weight: 284 lb (128.822 kg)   Body mass index is 40.16 kg/(m^2).  Physical Exam  General: obese middle aged Caucasian male seated, in no evident distress  Head: head normocephalic and atraumatic. Orohparynx benign  Neck: supple with no carotid or supraclavicular bruits  Cardiovascular: regular rate and rhythm, no  murmurs  Musculoskeletal: left knee pain,swelling and deformity  Skin: no rash/petichiae  Vascular: Normal pulses all extremities   Neurologic Exam  Mental Status: Awake and fully alert. Oriented to place and time. Recent and remote memory intact. Attention span, concentration and fund of knowledge appropriate. Mood and affect appropriate.  Cranial Nerves: Pupils equal, briskly reactive to light. Extraocular movements full without nystagmus. Visual fields full to confrontation. Hearing intact. Facial sensation intact. Face, tongue, palate moves normally and symmetrically.  Motor: Normal bulk and tone. Normal strength in all tested extremity muscles.  Sensory: intact to touch   Coordination: Rapid alternating movements normal in all extremities. Finger-to-nose and heel-to-shin performed accurately bilaterally.  Gait and Station: Arises from chair with mild difficulty. Stance is normal. Gait demonstrates normal stride length and balance .  Reflexes: 1+ and symmetric.  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 Vascular risk factors of Diabetes, Hypertension, Hyperlipidimia, Sleep Apnea, Smoking and Obesity.  PLAN: I had along discussion with patient and wife with regards to silent infarcts, small vessel disease, stroke risk factors and risk factor modification and answered questions.   Continue Plavix for secondary stroke prevention with strict control of diabetes with hemoglobin A1c goal below 6.5%, hypertension with blood pressure goal below 130/90, lipids with LDL cholesterol goal below 70 mg percent and use CPAP every night for sleep apnea.  Return for followup in 6 months or call earlier if necessary.   Ronal FearLYNN E. Tama Grosz, MSN, NP-C 10/29/2013, 4:50 PM [facility] [address], [address] [place], [place] [address] [phone]  Note: This document was prepared with digital dictation and possible smart phrase technology. Any transcriptional errors that result from this process are unintentional.

## 2013-11-04 ENCOUNTER — Ambulatory Visit: Payer: Self-pay | Admitting: Nurse Practitioner

## 2013-11-15 ENCOUNTER — Emergency Department (HOSPITAL_COMMUNITY): Payer: 59

## 2013-11-15 ENCOUNTER — Encounter (HOSPITAL_COMMUNITY): Payer: Self-pay | Admitting: Emergency Medicine

## 2013-11-15 ENCOUNTER — Observation Stay (HOSPITAL_COMMUNITY)
Admission: EM | Admit: 2013-11-15 | Discharge: 2013-11-16 | DRG: 066 | Disposition: A | Discharge status: Home / Self Care | Payer: 59 | Attending: Internal Medicine | Admitting: Internal Medicine

## 2013-11-15 DIAGNOSIS — R739 Hyperglycemia, unspecified: Secondary | ICD-10-CM

## 2013-11-15 DIAGNOSIS — Z7902 Long term (current) use of antithrombotics/antiplatelets: Secondary | ICD-10-CM

## 2013-11-15 DIAGNOSIS — R202 Paresthesia of skin: Secondary | ICD-10-CM

## 2013-11-15 DIAGNOSIS — G039 Meningitis, unspecified: Secondary | ICD-10-CM

## 2013-11-15 DIAGNOSIS — R200 Anesthesia of skin: Secondary | ICD-10-CM

## 2013-11-15 DIAGNOSIS — G9340 Encephalopathy, unspecified: Secondary | ICD-10-CM

## 2013-11-15 DIAGNOSIS — I6789 Other cerebrovascular disease: Secondary | ICD-10-CM

## 2013-11-15 DIAGNOSIS — Z01810 Encounter for preprocedural cardiovascular examination: Secondary | ICD-10-CM

## 2013-11-15 DIAGNOSIS — Z8673 Personal history of transient ischemic attack (TIA), and cerebral infarction without residual deficits: Secondary | ICD-10-CM

## 2013-11-15 DIAGNOSIS — Z96659 Presence of unspecified artificial knee joint: Secondary | ICD-10-CM

## 2013-11-15 DIAGNOSIS — Z87891 Personal history of nicotine dependence: Secondary | ICD-10-CM

## 2013-11-15 DIAGNOSIS — I6350 Cerebral infarction due to unspecified occlusion or stenosis of unspecified cerebral artery: Principal | ICD-10-CM

## 2013-11-15 DIAGNOSIS — E079 Disorder of thyroid, unspecified: Secondary | ICD-10-CM

## 2013-11-15 DIAGNOSIS — G4733 Obstructive sleep apnea (adult) (pediatric): Secondary | ICD-10-CM

## 2013-11-15 DIAGNOSIS — K8010 Calculus of gallbladder with chronic cholecystitis without obstruction: Secondary | ICD-10-CM

## 2013-11-15 DIAGNOSIS — Z9989 Dependence on other enabling machines and devices: Secondary | ICD-10-CM

## 2013-11-15 DIAGNOSIS — R42 Dizziness and giddiness: Secondary | ICD-10-CM

## 2013-11-15 DIAGNOSIS — I6381 Other cerebral infarction due to occlusion or stenosis of small artery: Secondary | ICD-10-CM | POA: Diagnosis present

## 2013-11-15 DIAGNOSIS — E669 Obesity, unspecified: Secondary | ICD-10-CM

## 2013-11-15 DIAGNOSIS — R011 Cardiac murmur, unspecified: Secondary | ICD-10-CM

## 2013-11-15 DIAGNOSIS — R509 Fever, unspecified: Secondary | ICD-10-CM

## 2013-11-15 DIAGNOSIS — J438 Other emphysema: Secondary | ICD-10-CM | POA: Diagnosis present

## 2013-11-15 DIAGNOSIS — E119 Type 2 diabetes mellitus without complications: Secondary | ICD-10-CM

## 2013-11-15 DIAGNOSIS — G0490 Encephalitis and encephalomyelitis, unspecified: Secondary | ICD-10-CM

## 2013-11-15 DIAGNOSIS — I639 Cerebral infarction, unspecified: Secondary | ICD-10-CM | POA: Diagnosis present

## 2013-11-15 DIAGNOSIS — I10 Essential (primary) hypertension: Secondary | ICD-10-CM

## 2013-11-15 DIAGNOSIS — E785 Hyperlipidemia, unspecified: Secondary | ICD-10-CM

## 2013-11-15 LAB — DIFFERENTIAL
BASOS ABS: 0 10*3/uL (ref 0.0–0.1)
Basophils Relative: 0 % (ref 0–1)
EOS ABS: 0.3 10*3/uL (ref 0.0–0.7)
EOS PCT: 4 % (ref 0–5)
LYMPHS PCT: 15 % (ref 12–46)
Lymphs Abs: 1.5 10*3/uL (ref 0.7–4.0)
MONO ABS: 0.6 10*3/uL (ref 0.1–1.0)
Monocytes Relative: 6 % (ref 3–12)
Neutro Abs: 7.2 10*3/uL (ref 1.7–7.7)
Neutrophils Relative %: 75 % (ref 43–77)

## 2013-11-15 LAB — I-STAT TROPONIN, ED: Troponin i, poc: 0 ng/mL (ref 0.00–0.08)

## 2013-11-15 LAB — CBC
HEMATOCRIT: 38.4 % — AB (ref 39.0–52.0)
Hemoglobin: 12.7 g/dL — ABNORMAL LOW (ref 13.0–17.0)
MCH: 28.3 pg (ref 26.0–34.0)
MCHC: 33.1 g/dL (ref 30.0–36.0)
MCV: 85.7 fL (ref 78.0–100.0)
Platelets: 201 10*3/uL (ref 150–400)
RBC: 4.48 MIL/uL (ref 4.22–5.81)
RDW: 14.5 % (ref 11.5–15.5)
WBC: 9.6 10*3/uL (ref 4.0–10.5)

## 2013-11-15 LAB — COMPREHENSIVE METABOLIC PANEL
ALT: 8 U/L (ref 0–53)
AST: 10 U/L (ref 0–37)
Albumin: 3.5 g/dL (ref 3.5–5.2)
Alkaline Phosphatase: 80 U/L (ref 39–117)
BUN: 16 mg/dL (ref 6–23)
CALCIUM: 9 mg/dL (ref 8.4–10.5)
CO2: 26 mEq/L (ref 19–32)
CREATININE: 0.75 mg/dL (ref 0.50–1.35)
Chloride: 101 mEq/L (ref 96–112)
GFR calc non Af Amer: >90 mL/min (ref >90)
GLUCOSE: 173 mg/dL — AB (ref 70–99)
Potassium: 4.2 mEq/L (ref 3.7–5.3)
Sodium: 139 mEq/L (ref 137–147)
TOTAL PROTEIN: 6.7 g/dL (ref 6.0–8.3)
Total Bilirubin: 0.4 mg/dL (ref 0.3–1.2)

## 2013-11-15 LAB — GLUCOSE, CAPILLARY: Glucose-Capillary: 100 mg/dL — ABNORMAL HIGH (ref 70–99)

## 2013-11-15 LAB — CBG MONITORING, ED: Glucose-Capillary: 181 mg/dL — ABNORMAL HIGH (ref 70–99)

## 2013-11-15 LAB — PROTIME-INR
INR: 1.1 (ref 0.00–1.49)
PROTHROMBIN TIME: 14 s (ref 11.6–15.2)

## 2013-11-15 LAB — APTT: aPTT: 32 seconds (ref 24–37)

## 2013-11-15 MED ORDER — ALBUTEROL SULFATE (2.5 MG/3ML) 0.083% IN NEBU: 2.5000 mg | INHALATION_SOLUTION | RESPIRATORY_TRACT | Status: DC | PRN

## 2013-11-15 MED ORDER — ALBUTEROL SULFATE HFA 108 (90 BASE) MCG/ACT IN AERS: 2.0000 | INHALATION_SPRAY | RESPIRATORY_TRACT | Status: DC | PRN

## 2013-11-15 MED ORDER — BENAZEPRIL HCL 40 MG PO TABS
40.0000 mg | ORAL_TABLET | Freq: Two times a day (BID) | ORAL | Status: DC
  Administered 2013-11-16: 40 mg via ORAL
  Filled 2013-11-15 (×3): qty 1

## 2013-11-15 MED ORDER — ATORVASTATIN CALCIUM 40 MG PO TABS
40.0000 mg | ORAL_TABLET | Freq: Every day | ORAL | Status: DC
  Filled 2013-11-15: qty 1

## 2013-11-15 MED ORDER — ASPIRIN 81 MG PO CHEW
81.0000 mg | CHEWABLE_TABLET | Freq: Once | ORAL | Status: AC
  Administered 2013-11-15: 81 mg via ORAL
  Filled 2013-11-15: qty 1

## 2013-11-15 MED ORDER — GLYBURIDE-METFORMIN 5-500 MG PO TABS: 1.0000 | ORAL_TABLET | Freq: Three times a day (TID) | ORAL | Status: DC

## 2013-11-15 MED ORDER — METOPROLOL SUCCINATE ER 100 MG PO TB24
100.0000 mg | ORAL_TABLET | Freq: Every day | ORAL | Status: DC
  Administered 2013-11-16: 100 mg via ORAL
  Filled 2013-11-15: qty 1

## 2013-11-15 MED ORDER — ADULT MULTIVITAMIN W/MINERALS CH
1.0000 | ORAL_TABLET | Freq: Every day | ORAL | Status: DC
  Administered 2013-11-16: 1 via ORAL
  Filled 2013-11-15: qty 1

## 2013-11-15 MED ORDER — CLOPIDOGREL BISULFATE 75 MG PO TABS
75.0000 mg | ORAL_TABLET | Freq: Every day | ORAL | Status: DC
  Administered 2013-11-16: 75 mg via ORAL
  Filled 2013-11-15: qty 1

## 2013-11-15 MED ORDER — HEPARIN SODIUM (PORCINE) 5000 UNIT/ML IJ SOLN
5000.0000 [IU] | Freq: Three times a day (TID) | INTRAMUSCULAR | Status: DC
  Administered 2013-11-15 – 2013-11-16 (×2): 5000 [IU] via SUBCUTANEOUS
  Filled 2013-11-15 (×5): qty 1

## 2013-11-15 MED ORDER — AMLODIPINE BESYLATE 10 MG PO TABS
10.0000 mg | ORAL_TABLET | Freq: Every day | ORAL | Status: DC
  Administered 2013-11-16: 10 mg via ORAL
  Filled 2013-11-15: qty 1

## 2013-11-15 MED ORDER — METFORMIN HCL 500 MG PO TABS
500.0000 mg | ORAL_TABLET | Freq: Three times a day (TID) | ORAL | Status: DC
  Administered 2013-11-16 (×2): 500 mg via ORAL
  Filled 2013-11-15 (×4): qty 1

## 2013-11-15 MED ORDER — LEVOTHYROXINE SODIUM 200 MCG PO TABS
200.0000 ug | ORAL_TABLET | Freq: Every day | ORAL | Status: DC
  Administered 2013-11-16: 200 ug via ORAL
  Filled 2013-11-15 (×2): qty 1

## 2013-11-15 MED ORDER — TRIAMCINOLONE ACETONIDE 0.1 % EX OINT
1.0000 "application " | TOPICAL_OINTMENT | Freq: Two times a day (BID) | CUTANEOUS | Status: DC | PRN
  Filled 2013-11-15: qty 15

## 2013-11-15 MED ORDER — GLYBURIDE 5 MG PO TABS
5.0000 mg | ORAL_TABLET | Freq: Three times a day (TID) | ORAL | Status: DC
  Administered 2013-11-16 (×2): 5 mg via ORAL
  Filled 2013-11-15 (×4): qty 1

## 2013-11-15 NOTE — ED Notes (Signed)
Presents with sudden onset of dizziness began at 1:30 today, dizziness is worse with movement. No other neurological deficits. Equal grips, no facial droop. Pt is alert, answers all questions appropriately. denies pain.  HX of TIAs

## 2013-11-15 NOTE — ED Notes (Signed)
hospitalist at bedside

## 2013-11-15 NOTE — H&P (Addendum)
[facility] History and Physical  LABARON DIGIROLAMO NWG:[id number] DOB: September 26, 1945 DOA: 11/15/2013  Referring physician: EDP PCP: [name], MD   Chief Complaint: Dizziness   HPI: Mason Patton is a 68 y.o. male who presents to the ED as a code stroke with sudden onset of dizziness 3 hours PTA.  On in depth review patient does admit to 2-3 weeks of very mild dizziness but nothing like today.  Acute onset occurred at work when looking up.  Dizziness near syncopal, symptoms worse with ambulation.  Has history of prior TIA takes plavix.  Review of Systems: Systems reviewed.  As above, otherwise negative  Past Medical History  Diagnosis Date  . Hypertension   . Thyroid disease   . Gallstones and inflammation of gallbladder without obstruction   . Cholecystitis with cholelithiasis   . Arthritis   . Emphysema of lung   . Hyperlipidemia   . Eczema   . Umbilical hernia   . Tobacco abuse     quit 11/08/2012  . Obesity   . History of nuclear stress test 02/23/2010    dipyridamole; normal pattern of perfusion in all regions; normal, low risk study   . Meningoencephalitis   . Shortness of breath   . Heart murmur   . Pneumonia     "twice" (05/19/2013)  . OSA on CPAP     last sleep study >8 yrs  . 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 Type II diabetes mellitus   . Stroke     "they said I had a minor one when I had menigitis" (05/19/2013)   Past Surgical History  Procedure Laterality Date  . Knee arthroscopy Right     "w2 times" (05/19/2013)  . Cholecystectomy  09/09/2011    Procedure: LAPAROSCOPIC CHOLECYSTECTOMY WITH INTRAOPERATIVE CHOLANGIOGRAM;  Surgeon: Cherylynn Ridges III, MD;  Location: [facility] OR;  Service: General;  Laterality: N/A;  . Transthoracic echocardiogram  03/28/2005    EF=>55%, mod conc LVH; LA mod dilated & borderline RA enlargement; mild TR; mild pulm valve regurg  . Joint replacement    . Tonsillectomy    . Hernia repair  04/2006     UHR  . 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  He has a 25 pack-year smoking history. He has never used smokeless tobacco. He reports that he does not drink alcohol or use illicit drugs.  Allergies  Allergen Reactions  . Codeine Nausea And Vomiting  . Demerol Nausea And Vomiting    Family History  Problem Relation Age of Onset  . Cancer Maternal Aunt     colon  . Cancer Maternal Aunt     lung  . Thyroid disease Mother   . Hyperlipidemia Father   . Diabetes Maternal Grandmother   . Asthma Child   . Diabetes type I Child      Prior to Admission medications   Medication Sig Start Date End Date Taking? Authorizing Provider  albuterol (PROVENTIL HFA;VENTOLIN HFA) 108 (90 BASE) MCG/ACT inhaler Inhale 2 puffs into the lungs every 4 (four) hours as needed for wheezing or shortness of breath.   Yes Historical Provider, MD  amLODipine (NORVASC) 10 MG tablet Take 10 mg by mouth  daily.   Yes Historical Provider, MD  atorvastatin (LIPITOR) 40 MG tablet Take 1 tablet (40 mg total) by mouth daily. 03/30/13  Yes Clanford Cyndie Mull, MD  benazepril (LOTENSIN) 40 MG tablet Take 40 mg by mouth 2 (two) times daily.   Yes Historical Provider, MD  clopidogrel (PLAVIX) 75 MG tablet Take 1 tablet (75 mg total) by mouth daily with breakfast. 03/31/13  Yes Clanford L Johnson, MD  glyBURIDE-metformin (GLUCOVANCE) 5-500 MG per tablet Take 1 tablet by mouth 3 (three) times daily.    Yes Historical Provider, MD  levothyroxine (SYNTHROID, LEVOTHROID) 200 MCG tablet Take 200 mcg by mouth daily before breakfast.    Yes Historical Provider, MD  metoprolol succinate (TOPROL-XL) 100 MG 24 hr tablet Take 100 mg by mouth daily. Take with or immediately  following a meal.   Yes Historical Provider, MD  Multiple Vitamin (MULTIVITAMIN WITH MINERALS) TABS tablet Take 1 tablet by mouth daily.   Yes Historical Provider, MD  triamcinolone ointment (KENALOG) 0.1 % Apply 1 application topically 2 (two) times daily as needed (rash).   Yes Historical Provider, MD   Physical Exam: Filed Vitals:   11/15/13 1917  BP: 156/66  Pulse: 59  Temp:   Resp:     BP 156/66  Pulse 59  Temp(Src) 97.8 F (36.6 C)  Resp 20  Wt 124.739 kg (275 lb)  SpO2 98%  General Appearance:    Alert, oriented, no distress, appears stated age  Head:    Normocephalic, atraumatic  Eyes:    PERRL, EOMI, sclera non-icteric        Nose:   Nares without drainage or epistaxis. Mucosa, turbinates normal  Throat:   Moist mucous membranes. Oropharynx without erythema or exudate.  Neck:   Supple. No carotid bruits.  No thyromegaly.  No lymphadenopathy.   Back:     No CVA tenderness, no spinal tenderness  Lungs:     Clear to auscultation bilaterally, without wheezes, rhonchi or rales  Chest wall:    No tenderness to palpitation  Heart:    Regular rate and rhythm without murmurs, gallops, rubs  Abdomen:     Soft, non-tender, nondistended, normal bowel sounds, no organomegaly  Genitalia:    deferred  Rectal:    deferred  Extremities:   No clubbing, cyanosis or edema.  Pulses:   2+ and symmetric all extremities  Skin:   Skin color, texture, turgor normal, no rashes or lesions  Lymph nodes:   Cervical, supraclavicular, and axillary nodes normal  Neurologic:   CNII-XII intact. Normal strength, sensation and reflexes      throughout    Labs on Admission:  Basic Metabolic Panel:  Recent Labs Lab 11/15/13 1645  NA 139  K 4.2  CL 101  CO2 26  GLUCOSE 173*  BUN 16  CREATININE 0.75  CALCIUM 9.0   Liver Function Tests:  Recent Labs Lab 11/15/13 1645  AST 10  ALT 8  ALKPHOS 80  BILITOT 0.4  PROT 6.7  ALBUMIN 3.5   No results found for this basename: LIPASE,  AMYLASE,  in the last 168 hours No results found for this basename: AMMONIA,  in the last 168 hours CBC:  Recent Labs Lab 11/15/13 1645  WBC 9.6  NEUTROABS 7.2  HGB 12.7*  HCT 38.4*  MCV 85.7  PLT 201   Cardiac Enzymes: No results found for this basename: CKTOTAL, CKMB, CKMBINDEX, TROPONINI,  in the last 168 hours  BNP (last 3 results) No results found  for this basename: PROBNP,  in the last 8760 hours CBG:  Recent Labs Lab 11/15/13 1649  GLUCAP 181*    Radiological Exams on Admission: Ct Head (brain) Wo Contrast  11/15/2013   CLINICAL DATA:  Dizziness  EXAM: CT HEAD WITHOUT CONTRAST  TECHNIQUE: Contiguous axial images were obtained from the base of the skull through the vertex without intravenous contrast.  COMPARISON:  Prior MRI from 03/28/2013 and CT from 03/23/2013  FINDINGS: Atrophy with chronic microvascular ischemic changes involving the supratentorial white matter again seen, stable from prior.  There is no acute intracranial hemorrhage or infarct. No mass lesion or midline shift. Gray-white matter differentiation is well maintained. Ventricles are normal in size without evidence of hydrocephalus. No extra-axial fluid collection.  The calvarium is intact.  Orbital soft tissues are within normal limits.  The paranasal sinuses are clear. Scattered opacity within the inferior right mastoid air cells is unchanged. No left mastoid effusion.  Scalp soft tissues are unremarkable.  IMPRESSION: 1. No acute intracranial process identified. 2. 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   Electronically Signed   By: Rise MuBenjamin  McClintock M.D.   On: 11/15/2013 17:14   [name]  11/15/2013   CLINICAL DATA:  Acute onset of dizziness.  Evaluate for stroke.  EXAM: MRI HEAD WITHOUT CONTRAST  MRA HEAD WITHOUT CONTRAST  TECHNIQUE: Multiplanar, multiecho pulse sequences of the brain and surrounding structures were obtained without intravenous  contrast. Angiographic images of the head were obtained using MRA technique without contrast.  COMPARISON:  Head CT 11/15/2013.  Head MRI and MRA 03/28/2013.  FINDINGS: MRI HEAD FINDINGS  There is no definite evidence of acute infarct. There is a 7 mm oblong focus of slightly increased diffusion weighted signal in the periventricular white matter of the left parietal lobe without definite restricted diffusion on the ADC map, possibly a small subacute small vessel infarct. There is no evidence of intracranial hemorrhage, mass, midline shift, or extra-axial fluid collection. Small foci of T2 hyperintensity in the subcortical and deep cerebral white matter have mildly progressed from the prior MRI and are nonspecific but compatible with mild to moderate chronic small vessel ischemic disease. There is mild generalized cerebral atrophy, unchanged. Remote infarcts are noted in the corona radiata.  Orbits are unremarkable. Mild bilateral ethmoid air cell mucosal thickening is noted. Small mastoid effusions are present. Major intracranial vascular flow voids are preserved.  MRA HEAD FINDINGS  Visualized distal vertebral arteries are patent. PICA origins appear patent. AICA and SCA origins appear patent. Basilar artery is patent without stenosis. There is a fetal origin of the left PCA. PCAs are otherwise unremarkable.  Internal carotid arteries are patent from skull base to carotid termini. ACAs and MCAs are unremarkable. No intracranial aneurysm is identified.  IMPRESSION: 1. No evidence of acute large territory infarct. 2. Possible subacute lacunar infarct in the left parietal periventricular white matter. 3. Mild-to-moderate chronic small vessel ischemic disease, mildly progressed from the prior MRI. 4. No evidence of major intracranial arterial occlusion or significant stenosis.   Electronically Signed   By: Sebastian AcheAllen  Grady   On: 11/15/2013 18:45   Mr Brain Wo Contrast  11/15/2013   CLINICAL DATA:  Acute onset of  dizziness.  Evaluate for stroke.  EXAM: MRI HEAD WITHOUT CONTRAST  MRA HEAD WITHOUT CONTRAST  TECHNIQUE: Multiplanar, multiecho pulse sequences of the brain and surrounding structures were obtained without intravenous contrast. Angiographic images of the head were obtained using MRA  technique without contrast.  COMPARISON:  Head CT 11/15/2013.  Head MRI and MRA 03/28/2013.  FINDINGS: MRI HEAD FINDINGS  There is no definite evidence of acute infarct. There is a 7 mm oblong focus of slightly increased diffusion weighted signal in the periventricular white matter of the left parietal lobe without definite restricted diffusion on the ADC map, possibly a small subacute small vessel infarct. There is no evidence of intracranial hemorrhage, mass, midline shift, or extra-axial fluid collection. Small foci of T2 hyperintensity in the subcortical and deep cerebral white matter have mildly progressed from the prior MRI and are nonspecific but compatible with mild to moderate chronic small vessel ischemic disease. There is mild generalized cerebral atrophy, unchanged. Remote infarcts are noted in the corona radiata.  Orbits are unremarkable. Mild bilateral ethmoid air cell mucosal thickening is noted. Small mastoid effusions are present. Major intracranial vascular flow voids are preserved.  MRA HEAD FINDINGS  Visualized distal vertebral arteries are patent. PICA origins appear patent. AICA and SCA origins appear patent. Basilar artery is patent without stenosis. There is a fetal origin of the left PCA. PCAs are otherwise unremarkable.  Internal carotid arteries are patent from skull base to carotid termini. ACAs and MCAs are unremarkable. No intracranial aneurysm is identified.  IMPRESSION: 1. No evidence of acute large territory infarct. 2. Possible subacute lacunar infarct in the left parietal periventricular white matter. 3. Mild-to-moderate chronic small vessel ischemic disease, mildly progressed from the prior MRI. 4. No  evidence of major intracranial arterial occlusion or significant stenosis.   Electronically Signed   By: Sebastian Ache   On: 11/15/2013 18:45    EKG: Independently reviewed.  Assessment/Plan Principal Problem:   Left sided lacunar infarction Active Problems:   Hypertension   DM2 (diabetes mellitus, type 2)   Dizziness   1. Possible Left sided subacute lacunar infarct - 7 mm area of infarct, unclear as to how much this will correlate with his symptoms today, admitting patient for stroke work up as recommended by neurology, neurologist to evaluate patient further tomorrow.  On stroke pathway for now.  Continue plavix for now.  2d echo and repeat carotid dopplers ordered, but doubtful that these will show any new findings since his last work up in September. 2. Dizziness - resolved spontaneously in ED. 3. NIDDM - continue home meds, CBG checks AC/HS 4. HTN - continue home meds    Code Status: Full Code  Family Communication: Wife at bedside Disposition Plan: Admit to inpatient   Time spent: 24 min  Hillary Bow [facility] Pager [phone]  If 7AM-7PM, please contact the day team taking care of the patient [email or web address] Password [email or web address] 11/15/2013, 7:50 PM

## 2013-11-15 NOTE — ED Notes (Signed)
Patient transported to CT 

## 2013-11-15 NOTE — ED Notes (Signed)
PT reports at 1330 he looked up and had a sudden onset of dizziness. PT reports continued dizziness. Denies numbness/tingling, confusion or weakness.

## 2013-11-15 NOTE — ED Provider Notes (Signed)
CSN: [id number]     Arrival date & time 11/15/13  1629 History   First MD Initiated Contact with Patient 11/15/13 1653     Chief Complaint  Patient presents with  . Dizziness  . Code Stroke    An emergency department physician performed an initial assessment on this suspected stroke patient at 1655.  HPI Patient presents as a code stroke.  Patient presents after experiencing the sudden onset of dizziness approximately 3 hours prior to my evaluation. Patient was in his usual state of health prior to the onset of symptoms.  Patient felt the acute onset as he was at work, looking up.  The dizziness is largely near syncopal, though there is an element of disequilibrium as well.  Symptoms are worse with ambulation. No nausea, vomiting, confusion, syncope. No chest pain or any other pain. Patient is on medication as directed. Patient has a history of prior TIA, currently takes Plavix.    Past Medical History  Diagnosis Date  . Hypertension   . Thyroid disease   . Gallstones and inflammation of gallbladder without obstruction   . Cholecystitis with cholelithiasis   . Arthritis   . Emphysema of lung   . Hyperlipidemia   . Eczema   . Umbilical hernia   . Tobacco abuse     quit 11/08/2012  . Obesity   . History of nuclear stress test 02/23/2010    dipyridamole; normal pattern of perfusion in all regions; normal, low risk study   . Meningoencephalitis   . Shortness of breath   . Heart murmur   . Pneumonia     "twice" (05/19/2013)  . OSA on CPAP     last sleep study >8 yrs  . 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 Type II diabetes mellitus   . Stroke     "they said I had a minor one when I had menigitis" (05/19/2013)   Past Surgical History  Procedure Laterality Date  . Knee arthroscopy Right     "w2 times" (05/19/2013)  . Cholecystectomy  09/09/2011    Procedure: LAPAROSCOPIC CHOLECYSTECTOMY WITH INTRAOPERATIVE CHOLANGIOGRAM;  Surgeon: Cherylynn Ridges  III, MD;  Location: [facility] OR;  Service: General;  Laterality: N/A;  . Transthoracic echocardiogram  03/28/2005    EF=>55%, mod conc LVH; LA mod dilated & borderline RA enlargement; mild TR; mild pulm valve regurg  . Joint replacement    . Tonsillectomy    . Hernia repair  04/2006    UHR  . 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 Cancer Maternal Aunt     colon  . Cancer Maternal Aunt     lung  . Thyroid disease Mother   . Hyperlipidemia Father   . Diabetes Maternal Grandmother   . Asthma Child   . Diabetes type I Child    History  Substance Use Topics  . Smoking status: Current Every Day Smoker -- 0.50 packs/day for 50 years    Types: Cigarettes    Last Attempt to Quit: 11/08/2012  . Smokeless tobacco: Never Used  . Alcohol Use: No    Review of Systems  Constitutional:       Per HPI, otherwise negative  HENT:  Per HPI, otherwise negative  Respiratory:       Per HPI, otherwise negative  Cardiovascular:       Per HPI, otherwise negative  Gastrointestinal: Negative for vomiting.  Endocrine:       Negative aside from HPI  Genitourinary:       Neg aside from HPI   Musculoskeletal:       Per HPI, otherwise negative  Skin: Negative.   Neurological: Positive for dizziness. Negative for syncope.      Allergies  Codeine and Demerol  Home Medications   Prior to Admission medications   Medication Sig Start Date End Date Taking? Authorizing Provider  albuterol (PROVENTIL HFA;VENTOLIN HFA) 108 (90 BASE) MCG/ACT inhaler Inhale 2 puffs into the lungs every 4 (four) hours as needed for wheezing or shortness of breath.   Yes Historical Provider, MD  amLODipine (NORVASC) 10 MG tablet Take 10 mg by mouth daily.   Yes Historical  Provider, MD  atorvastatin (LIPITOR) 40 MG tablet Take 1 tablet (40 mg total) by mouth daily. 03/30/13  Yes Clanford Cyndie MullL Johnson, MD  benazepril (LOTENSIN) 40 MG tablet Take 40 mg by mouth 2 (two) times daily.   Yes Historical Provider, MD  clopidogrel (PLAVIX) 75 MG tablet Take 1 tablet (75 mg total) by mouth daily with breakfast. 03/31/13  Yes Clanford L Johnson, MD  glyBURIDE-metformin (GLUCOVANCE) 5-500 MG per tablet Take 1 tablet by mouth 3 (three) times daily.    Yes Historical Provider, MD  levothyroxine (SYNTHROID, LEVOTHROID) 200 MCG tablet Take 200 mcg by mouth daily before breakfast.    Yes Historical Provider, MD  metoprolol succinate (TOPROL-XL) 100 MG 24 hr tablet Take 100 mg by mouth daily. Take with or immediately following a meal.   Yes Historical Provider, MD  Multiple Vitamin (MULTIVITAMIN WITH MINERALS) TABS tablet Take 1 tablet by mouth daily.   Yes Historical Provider, MD  triamcinolone ointment (KENALOG) 0.1 % Apply 1 application topically 2 (two) times daily as needed (rash).   Yes Historical Provider, MD   BP 152/63  Pulse 62  Resp 20  Wt 275 lb (124.739 kg)  SpO2 96% Physical Exam  Nursing note and vitals reviewed. Constitutional: He is oriented to person, place, and time. He appears well-developed. No distress.  HENT:  Head: Normocephalic and atraumatic.  Eyes: Conjunctivae and EOM are normal.  Cardiovascular: Normal rate and regular rhythm.   Pulmonary/Chest: Effort normal. No stridor. No respiratory distress.  Abdominal: He exhibits no distension.  Musculoskeletal: He exhibits no edema.  Neurological: He is alert and oriented to person, place, and time. He displays no atrophy and no tremor. No cranial nerve deficit or sensory deficit. He exhibits normal muscle tone. He displays no seizure activity. Coordination normal.  Skin: Skin is warm and dry.  Psychiatric: He has a normal mood and affect.    ED Course  Procedures (including critical care time) Labs  Review Labs Reviewed  CBC - Abnormal; Notable for the following:    Hemoglobin 12.7 (*)    HCT 38.4 (*)    All other components within normal limits  COMPREHENSIVE METABOLIC PANEL - Abnormal; Notable for the following:    Glucose, Bld 173 (*)    All other components within normal limits  CBG MONITORING, ED - Abnormal; Notable for the following:    Glucose-Capillary 181 (*)    All other components within normal limits  PROTIME-INR  APTT  DIFFERENTIAL  I-STAT TROPOININ, ED    Imaging Review Ct Head (brain)  Wo Contrast  11/15/2013   CLINICAL DATA:  Dizziness  EXAM: CT HEAD WITHOUT CONTRAST  TECHNIQUE: Contiguous axial images were obtained from the base of the skull through the vertex without intravenous contrast.  COMPARISON:  Prior MRI from 03/28/2013 and CT from 03/23/2013  FINDINGS: Atrophy with chronic microvascular ischemic changes involving the supratentorial white matter again seen, stable from prior.  There is no acute intracranial hemorrhage or infarct. No mass lesion or midline shift. Gray-white matter differentiation is well maintained. Ventricles are normal in size without evidence of hydrocephalus. No extra-axial fluid collection.  The calvarium is intact.  Orbital soft tissues are within normal limits.  The paranasal sinuses are clear. Scattered opacity within the inferior right mastoid air cells is unchanged. No left mastoid effusion.  Scalp soft tissues are unremarkable.  IMPRESSION: 1. No acute intracranial process identified. 2. 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   Electronically Signed   By: Rise Mu M.D.   On: 11/15/2013 17:14     EKG Interpretation   Date/Time:  Monday Nov 15 2013 16:41:30 EDT Ventricular Rate:  65 PR Interval:  178 QRS Duration: 102 QT Interval:  444 QTC Calculation: 461 R Axis:   -55 Text Interpretation:  Sinus rhythm with occasional Premature ventricular  complexes and Fusion complexes  Incomplete right bundle branch block Left  anterior fascicular block Possible Anterior infarct , age undetermined ST  \T\ T wave abnormality, consider lateral ischemia Abnormal ECG Sinus  rhythm Premature ventricular complexes ST-t wave abnormality Abnormal ekg  Confirmed by Gerhard Munch  MD [phone]) on 11/15/2013 4:49:29 PM      After initial unremarkable CT I arraigned for expeditious MR.   MRI demonstrates changes concerning for subacute stroke.  On repeat exam the patient is in no distress.  After discussion with our neurology team patient will receive additional aspirin, be admitted for further evaluation and management. MDM   Final diagnoses:  Dizziness   Patient presents with dizziness, MRI is found to be abnormal and the patient required admission for further evaluation and management.  Gerhard Munch, MD 11/15/13 [id number]

## 2013-11-15 NOTE — ED Notes (Signed)
Dizziness and Vision has been corrected. Pt. Denies pain.

## 2013-11-15 NOTE — Consult Note (Addendum)
Referring Physician: lockwood    Chief Complaint: Dizziness  HPI:                                                                                                                                         Mason Patton is an 68 y.o. male who works for a [employer]. He was standing looking at a piece of equipment that was above his head for about 12 minutes.  He then suddenly noted he felt dizzy and off balance. He denies a sensation of vertigo but more of a light headed sensation. He had no other symptoms of dysarthria, diplopia, nausea, vomiting, neck pain.  Due to huis symptoms persisting and a feeling of unsteady gait,  he was brought to the ED.  Currently he continues to feel dizzy but states if he is reclined the sensation will go away.    Of note patient states he had episode similar to this one year ago. Wife states at that time they were painting and he suddenly became pale and dizzy.  This lasted for only 10 minutes.   Date last known well: Date: 11/15/2013 Time last known well: Time: 13:30 tPA Given: No: out of window and NIHSS 0 ABCD2 --5  Past Medical History  Diagnosis Date  . Hypertension   . Thyroid disease   . Gallstones and inflammation of gallbladder without obstruction   . Cholecystitis with cholelithiasis   . Arthritis   . Emphysema of lung   . Hyperlipidemia   . Eczema   . Umbilical hernia   . Tobacco abuse     quit 11/08/2012  . Obesity   . History of nuclear stress test 02/23/2010    dipyridamole; normal pattern of perfusion in all regions; normal, low risk study   . Meningoencephalitis   . Shortness of breath   . Heart murmur   . Pneumonia     "twice" (05/19/2013)  . OSA on CPAP     last sleep study >8 yrs  . 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 Type II diabetes mellitus   . Stroke     "they said I had a minor one when I had menigitis" (05/19/2013)    Past Surgical History  Procedure Laterality Date  . Knee  arthroscopy Right     "w2 times" (05/19/2013)  . Cholecystectomy  09/09/2011    Procedure: LAPAROSCOPIC CHOLECYSTECTOMY WITH INTRAOPERATIVE CHOLANGIOGRAM;  Surgeon: Cherylynn Ridges III, MD;  Location: [facility] OR;  Service: General;  Laterality: N/A;  . Transthoracic echocardiogram  03/28/2005    EF=>55%, mod conc LVH; LA mod dilated & borderline RA enlargement; mild TR; mild pulm valve regurg  . Joint replacement    . Tonsillectomy    . Hernia repair  04/2006    UHR  . 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 Cancer Maternal Aunt     colon  . Cancer Maternal Aunt     lung  . Thyroid disease Mother   . Hyperlipidemia Father   . Diabetes Maternal Grandmother   . Asthma Child   . Diabetes type I Child    Social History:  reports that he has been smoking Cigarettes.  He has a 25 pack-year smoking history. He has never used smokeless tobacco. He reports that he does not drink alcohol or use illicit drugs.  Allergies:  Allergies  Allergen Reactions  . Codeine Nausea And Vomiting  . Demerol Nausea And Vomiting    Medications:                                                                                                                           No current facility-administered medications for this encounter.   Current Outpatient Prescriptions  Medication Sig Dispense Refill  . albuterol (PROVENTIL HFA;VENTOLIN HFA) 108 (90 BASE) MCG/ACT inhaler Inhale 2 puffs into the lungs every 4 (four) hours as needed for wheezing or shortness of breath.      [name] amLODipine (NORVASC) 10 MG tablet Take 10 mg by mouth daily.      [name] atorvastatin (LIPITOR) 40 MG tablet Take 1 tablet (40 mg total) by mouth daily.  30 tablet  0  . benazepril (LOTENSIN) 40 MG  tablet Take 40 mg by mouth 2 (two) times daily.      . clopidogrel (PLAVIX) 75 MG tablet Take 1 tablet (75 mg total) by mouth daily with breakfast.  30 tablet  0  . fexofenadine (ALLEGRA) 180 MG tablet Take 180 mg by mouth daily as needed for allergies.       [name] glyBURIDE-metformin (GLUCOVANCE) 5-500 MG per tablet Take 1 tablet by mouth 3 (three) times daily.       [name] levothyroxine (SYNTHROID, LEVOTHROID) 200 MCG tablet Take 200 mcg by mouth 2 (two) times daily.      . metoprolol (LOPRESSOR) 100 MG tablet Take 100 mg by mouth daily.      . Multiple Vitamin (MULTIVITAMIN WITH MINERALS) TABS tablet Take 1 tablet by mouth daily.      [name] triamcinolone ointment (KENALOG) 0.1 % Apply 1 application topically 2 (two) times daily as needed (rash).         ROS:  History obtained from the patient  General ROS: negative for - chills, fatigue, fever, night sweats, weight gain or weight loss Psychological ROS: negative for - behavioral disorder, hallucinations, memory difficulties, mood swings or suicidal ideation Ophthalmic ROS: negative for - blurry vision, double vision, eye pain or loss of vision ENT ROS: negative for - epistaxis, nasal discharge, oral lesions, sore throat, tinnitus or vertigo Allergy and Immunology ROS: negative for - hives or itchy/watery eyes Hematological and Lymphatic ROS: negative for - bleeding problems, bruising or swollen lymph nodes Endocrine ROS: negative for - galactorrhea, hair pattern changes, polydipsia/polyuria or temperature intolerance Respiratory ROS: negative for - cough, hemoptysis, shortness of breath or wheezing Cardiovascular ROS: negative for - chest pain, dyspnea on exertion, edema or irregular heartbeat Gastrointestinal ROS: negative for - abdominal pain, diarrhea, hematemesis, nausea/vomiting or stool incontinence Genito-Urinary  ROS: negative for - dysuria, hematuria, incontinence or urinary frequency/urgency Musculoskeletal ROS: negative for - joint swelling or muscular weakness Neurological ROS: as noted in HPI Dermatological ROS: negative for rash and skin lesion changes  Neurologic Examination:                                                                                                      Blood pressure 151/62, pulse 62, resp. rate 18, weight 124.739 kg (275 lb), SpO2 96.00%.   Mental Status: Alert, oriented, thought content appropriate.  Speech fluent without evidence of aphasia.  Able to follow 3 step commands without difficulty. Cranial Nerves: II: Discs flat bilaterally; Visual fields grossly normal, pupils equal, round, reactive to light and accommodation III,IV, VI: ptosis not present, extra-ocular motions intact bilaterally V,VII: smile symmetric, facial light touch sensation normal bilaterally VIII: hearing normal bilaterally IX,X: gag reflex present XI: bilateral shoulder shrug XII: midline tongue extension without atrophy or fasciculations  Motor: Right : Upper extremity   5/5    Left:     Upper extremity   5/5  Lower extremity   5/5     Lower extremity   5/5 Tone and bulk:normal tone throughout; no atrophy noted Sensory: Pinprick and light touch intact throughout, bilaterally Deep Tendon Reflexes:  Right: Upper Extremity   Left: Upper extremity   biceps (C-5 to C-6) 2/4   biceps (C-5 to C-6) 2/4 tricep (C7) 2/4    triceps (C7) 2/4 Brachioradialis (C6) 2/4  Brachioradialis (C6) 2/4  Lower Extremity Lower Extremity  quadriceps (L-2 to L-4) 2/4   quadriceps (L-2 to L-4) 2/4 Achilles (S1) 1/4   Achilles (S1) 1/4  Plantars: Right: downgoing   Left: downgoing Cerebellar: normal finger-to-nose,  normal heel-to-shin test Gait: not tested due to multiple leads.  CV: pulses palpable throughout    Lab Results: Basic Metabolic Panel: No results found for this basename: NA, K, CL, CO2,  GLUCOSE, BUN, CREATININE, CALCIUM, MG, PHOS,  in the last 168 hours  Liver Function Tests: No results found for this basename: AST, ALT, ALKPHOS, BILITOT, PROT, ALBUMIN,  in the last 168 hours No results found for this basename: LIPASE, AMYLASE,  in the last 168 hours No results found for this  basename: AMMONIA,  in the last 168 hours  CBC: No results found for this basename: WBC, NEUTROABS, HGB, HCT, MCV, PLT,  in the last 168 hours  Cardiac Enzymes: No results found for this basename: CKTOTAL, CKMB, CKMBINDEX, TROPONINI,  in the last 168 hours  Lipid Panel: No results found for this basename: CHOL, TRIG, HDL, CHOLHDL, VLDL, LDLCALC,  in the last 168 hours  CBG:  Recent Labs Lab 11/15/13 1649  GLUCAP 181*    Microbiology: Results for orders placed during the hospital encounter of 05/14/13  SURGICAL PCR SCREEN     Status: None   Collection Time    05/14/13  1:46 PM      Result Value Ref Range Status   MRSA, PCR NEGATIVE  NEGATIVE Final   Staphylococcus aureus NEGATIVE  NEGATIVE Final   Comment:            The Xpert SA Assay (FDA     approved for NASAL specimens     in patients over 68 years of age),     is one component of     a comprehensive surveillance     program.  Test performance has     been validated by [facility] for patients greater     than or equal to 68 year old.     It is not intended     to diagnose infection nor to     guide or monitor treatment.    Coagulation Studies: No results found for this basename: LABPROT, INR,  in the last 72 hours  Imaging: No results found.   Felicie MornDavid Smith PA-C [facility] [phone]  11/15/2013, 5:04 PM   Patient seen and examined.  Clinical course and management discussed.  Necessary edits performed.  I agree with the above.  Assessment and plan of care developed and discussed below.   Assessment: 68 y.o. male with history of stroke presenting with complaint of acute onset dizziness that has  not resolved.  Patient has an NIHSS of 0.  Head CT reviewed and shows no acute changes.  With multiple vascular risk factors will rule out for stroke and posterior circulation thrombus.  Stroke Risk Factors - diabetes mellitus, hyperlipidemia, hypertension and smoking  Recommendations: 1.  MRI of the brain, MRA of the brain.  Will follow up results.   2.  If above unremarkable would treat with Antivert.   3.  If diagnostic of an acute event would admit for a stroke work up.   4.  Patient to remain on Plavix daily      Thana FarrLeslie Love Chowning, MD [facility] [phone]  11/15/2013  5:46 PM  Addendum: MRI of the brain reviewed and shows a possible left parietal infarct in the periventricular white matter.  Further work up recommended.  Recommendations: 1. HgbA1c, fasting lipid panel 2. PT consult, OT consult, Speech consult 3. Echocardiogram 4. Carotid dopplers 5. Prophylactic therapy-Antiplatelet med: Aspirin - dose 81mg  tonight 6. Risk factor modification 7. Telemetry monitoring 8. Frequent neuro checks  Thana FarrLeslie Zaiah Credeur, MD [facility] [phone]

## 2013-11-15 NOTE — ED Notes (Signed)
Md Jeraldine Loots at bedside evaluating if this is a code stroke. Pt is AO x4.

## 2013-11-15 NOTE — ED Notes (Signed)
PT returned from CT. Neurology at bedside.

## 2013-11-15 NOTE — ED Notes (Addendum)
CBG: 115 

## 2013-11-15 NOTE — ED Notes (Signed)
Patient transported to MRI 

## 2013-11-16 DIAGNOSIS — I639 Cerebral infarction, unspecified: Secondary | ICD-10-CM | POA: Diagnosis present

## 2013-11-16 DIAGNOSIS — I6350 Cerebral infarction due to unspecified occlusion or stenosis of unspecified cerebral artery: Secondary | ICD-10-CM

## 2013-11-16 DIAGNOSIS — I517 Cardiomegaly: Secondary | ICD-10-CM

## 2013-11-16 LAB — HEMOGLOBIN A1C
Hgb A1c MFr Bld: 7.2 % — ABNORMAL HIGH (ref <5.7)
Mean Plasma Glucose: 160 mg/dL — ABNORMAL HIGH (ref <117)

## 2013-11-16 LAB — GLUCOSE, CAPILLARY
GLUCOSE-CAPILLARY: 150 mg/dL — AB (ref 70–99)
GLUCOSE-CAPILLARY: 105 mg/dL — AB (ref 70–99)

## 2013-11-16 LAB — LIPID PANEL
CHOL/HDL RATIO: 4 ratio
Cholesterol: 97 mg/dL (ref 0–200)
HDL: 24 mg/dL — ABNORMAL LOW (ref >39)
LDL CALC: 39 mg/dL (ref 0–99)
Triglycerides: 171 mg/dL — ABNORMAL HIGH (ref <150)
VLDL: 34 mg/dL (ref 0–40)

## 2013-11-16 MED ORDER — STUDY - INVESTIGATIONAL DRUG SIMPLE RECORD: 90.0000 mg | Freq: Two times a day (BID) | Status: DC

## 2013-11-16 MED ORDER — STUDY - INVESTIGATIONAL DRUG SIMPLE RECORD
90.0000 mg | Freq: Two times a day (BID) | Status: DC
  Filled 2013-11-16: qty 90

## 2013-11-16 MED ORDER — STUDY - INVESTIGATIONAL DRUG SIMPLE RECORD
180.0000 mg | Freq: Once | Status: AC
  Administered 2013-11-16: 180 mg via ORAL
  Filled 2013-11-16: qty 180

## 2013-11-16 MED ORDER — STUDY - INVESTIGATIONAL DRUG SIMPLE RECORD: 100.0000 mg | Freq: Every day | Status: DC

## 2013-11-16 MED ORDER — STUDY - INVESTIGATIONAL DRUG SIMPLE RECORD
300.0000 mg | Freq: Once | Status: AC
  Administered 2013-11-16: 300 mg via ORAL
  Filled 2013-11-16: qty 300

## 2013-11-16 NOTE — Progress Notes (Signed)
  Echocardiogram 2D Echocardiogram has been performed.  Real Cons Ozell Juhasz 11/16/2013, 10:42 AM

## 2013-11-16 NOTE — Progress Notes (Signed)
OT Cancellation Note  Patient Details Name: Mason Patton MRN: [id number] DOB: Mar 03, 1946   Cancelled Treatment:    Reason Eval/Treat Not Completed: OT screened, no needs identified, will sign off.  Pt observed to be independent in ADL and ADL transfers.  All symptoms for which he was admitted have resolved.  Dayton Bailiff Naria Abbey 11/16/2013, 11:06 AM [phone]

## 2013-11-16 NOTE — Discharge Summary (Signed)
Physician Discharge Summary  Patient ID: SUFIAN RAVI MRN: [id number] DOB/AGE: 1945/11/05 68 y.o.  Admit date: 11/15/2013 Discharge date: 11/16/2013  Primary Care Physician:  [name], MD  Discharge Diagnoses:    . Dizziness . Left sided lacunar infarction . DM2 (diabetes mellitus, type 2) . Hypertension . CVA (cerebral infarction)  Consults: Neurology, Dr Pearlean Brownie   Recommendations for Outpatient Follow-up:  Patient is currently enrolled in the Socrates trial.  Allergies:   Allergies  Allergen Reactions  . Codeine Nausea And Vomiting  . Demerol Nausea And Vomiting     Discharge Medications:   Medication List    STOP taking these medications       clopidogrel 75 MG tablet  Commonly known as:  PLAVIX      TAKE these medications       albuterol 108 (90 BASE) MCG/ACT inhaler  Commonly known as:  PROVENTIL HFA;VENTOLIN HFA  Inhale 2 puffs into the lungs every 4 (four) hours as needed for wheezing or shortness of breath.     amLODipine 10 MG tablet  Commonly known as:  NORVASC  Take 10 mg by mouth daily.     atorvastatin 40 MG tablet  Commonly known as:  LIPITOR  Take 1 tablet (40 mg total) by mouth daily.     benazepril 40 MG tablet  Commonly known as:  LOTENSIN  Take 40 mg by mouth 2 (two) times daily.     glyBURIDE-metformin 5-500 MG per tablet  Commonly known as:  GLUCOVANCE  Take 1 tablet by mouth 3 (three) times daily.     levothyroxine 200 MCG tablet  Commonly known as:  SYNTHROID, LEVOTHROID  Take 200 mcg by mouth daily before breakfast.     metoprolol succinate 100 MG 24 hr tablet  Commonly known as:  TOPROL-XL  Take 100 mg by mouth daily. Take with or immediately following a meal.     multivitamin with minerals Tabs tablet  Take 1 tablet by mouth daily.     research study medication  Take 90 mg by mouth 2 (two) times daily.     research study medication  Take 100 mg by mouth daily.  Start taking on:  11/17/2013     triamcinolone  ointment 0.1 %  Commonly known as:  KENALOG  Apply 1 application topically 2 (two) times daily as needed (rash).         Brief H and P: For complete details please refer to admission H and P, but in brief Mason Patton is a 68 y.o. male who presents to the ED as a code stroke with sudden onset of dizziness 3 hours PTA. On in depth review patient does admit to 2-3 weeks of very mild dizziness but nothing like today. Acute onset occurred at work when looking up. Dizziness near syncopal, symptoms worse with ambulation. Has history of prior TIA takes plavix   Hospital Course:  CVA (cerebral infarction) with dizziness  - MRI showed left-sided subacute lacunar infarct  - Neurology was consulted and recommended full stroke workup  - 2-D echo showed EF 55-60%  - carotid Dopplers showed 1-39% ICA stenosis  Patient was enrolled in Socrates trial, will follow outpatient with Dr Pearlean Brownie.   - PT evaluation shows that he is  back to his baseline  - LDL 39, continue Lipitor   Hypertension  - Currently stable   DM2 (diabetes mellitus, type 2)  - Currently stable, on glyburide and metformin at home    Day of Discharge  BP 130/52  Pulse 71  Temp(Src) 97.5 F (36.4 C) (Oral)  Resp 20  Ht 5' 10.5" (1.791 m)  Wt 123.333 kg (271 lb 14.4 oz)  BMI 38.45 kg/m2  SpO2 98%  Physical Exam: General: Alert and awake oriented x3 not in any acute distress CVS: S1-S2 clear no murmur rubs or gallops Chest: clear to auscultation bilaterally, no wheezing rales or rhonchi Abdomen: soft nontender, nondistended, normal bowel sounds Extremities: no cyanosis, clubbing or edema noted bilaterally Neuro: Cranial nerves II-XII intact, no focal neurological deficits   The results of significant diagnostics from this hospitalization (including imaging, microbiology, ancillary and laboratory) are listed below for reference.    LAB RESULTS: Basic Metabolic Panel:  Recent Labs Lab 11/15/13 1645  NA 139  K  4.2  CL 101  CO2 26  GLUCOSE 173*  BUN 16  CREATININE 0.75  CALCIUM 9.0   Liver Function Tests:  Recent Labs Lab 11/15/13 1645  AST 10  ALT 8  ALKPHOS 80  BILITOT 0.4  PROT 6.7  ALBUMIN 3.5   No results found for this basename: LIPASE, AMYLASE,  in the last 168 hours No results found for this basename: AMMONIA,  in the last 168 hours CBC:  Recent Labs Lab 11/15/13 1645  WBC 9.6  NEUTROABS 7.2  HGB 12.7*  HCT 38.4*  MCV 85.7  PLT 201   Cardiac Enzymes: No results found for this basename: CKTOTAL, CKMB, CKMBINDEX, TROPONINI,  in the last 168 hours BNP: No components found with this basename: POCBNP,  CBG:  Recent Labs Lab 11/16/13 0730 11/16/13 1135  GLUCAP 150* 105*    Significant Diagnostic Studies:  Ct Head (brain) Wo Contrast  11/15/2013   CLINICAL DATA:  Dizziness  EXAM: CT HEAD WITHOUT CONTRAST  TECHNIQUE: Contiguous axial images were obtained from the base of the skull through the vertex without intravenous contrast.  COMPARISON:  Prior MRI from 03/28/2013 and CT from 03/23/2013  FINDINGS: Atrophy with chronic microvascular ischemic changes involving the supratentorial white matter again seen, stable from prior.  There is no acute intracranial hemorrhage or infarct. No mass lesion or midline shift. Gray-white matter differentiation is well maintained. Ventricles are normal in size without evidence of hydrocephalus. No extra-axial fluid collection.  The calvarium is intact.  Orbital soft tissues are within normal limits.  The paranasal sinuses are clear. Scattered opacity within the inferior right mastoid air cells is unchanged. No left mastoid effusion.  Scalp soft tissues are unremarkable.  IMPRESSION: 1. No acute intracranial process identified. 2. 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   Electronically Signed   By: Rise Mu M.D.   On: 11/15/2013 17:14   Mr Maxine Glenn Head Wo Contrast  11/15/2013   CLINICAL DATA:   Acute onset of dizziness.  Evaluate for stroke.  EXAM: MRI HEAD WITHOUT CONTRAST  MRA HEAD WITHOUT CONTRAST  TECHNIQUE: Multiplanar, multiecho pulse sequences of the brain and surrounding structures were obtained without intravenous contrast. Angiographic images of the head were obtained using MRA technique without contrast.  COMPARISON:  Head CT 11/15/2013.  Head MRI and MRA 03/28/2013.  FINDINGS: MRI HEAD FINDINGS  There is no definite evidence of acute infarct. There is a 7 mm oblong focus of slightly increased diffusion weighted signal in the periventricular white matter of the left parietal lobe without definite restricted diffusion on the ADC map, possibly a small subacute small vessel infarct. There is no evidence of intracranial hemorrhage, mass, midline shift, or extra-axial fluid  collection. Small foci of T2 hyperintensity in the subcortical and deep cerebral white matter have mildly progressed from the prior MRI and are nonspecific but compatible with mild to moderate chronic small vessel ischemic disease. There is mild generalized cerebral atrophy, unchanged. Remote infarcts are noted in the corona radiata.  Orbits are unremarkable. Mild bilateral ethmoid air cell mucosal thickening is noted. Small mastoid effusions are present. Major intracranial vascular flow voids are preserved.  MRA HEAD FINDINGS  Visualized distal vertebral arteries are patent. PICA origins appear patent. AICA and SCA origins appear patent. Basilar artery is patent without stenosis. There is a fetal origin of the left PCA. PCAs are otherwise unremarkable.  Internal carotid arteries are patent from skull base to carotid termini. ACAs and MCAs are unremarkable. No intracranial aneurysm is identified.  IMPRESSION: 1. No evidence of acute large territory infarct. 2. Possible subacute lacunar infarct in the left parietal periventricular white matter. 3. Mild-to-moderate chronic small vessel ischemic disease, mildly progressed from the  prior MRI. 4. No evidence of major intracranial arterial occlusion or significant stenosis.   Electronically Signed   By: Sebastian Ache   On: 11/15/2013 18:45   Mr Brain Wo Contrast  11/15/2013   CLINICAL DATA:  Acute onset of dizziness.  Evaluate for stroke.  EXAM: MRI HEAD WITHOUT CONTRAST  MRA HEAD WITHOUT CONTRAST  TECHNIQUE: Multiplanar, multiecho pulse sequences of the brain and surrounding structures were obtained without intravenous contrast. Angiographic images of the head were obtained using MRA technique without contrast.  COMPARISON:  Head CT 11/15/2013.  Head MRI and MRA 03/28/2013.  FINDINGS: MRI HEAD FINDINGS  There is no definite evidence of acute infarct. There is a 7 mm oblong focus of slightly increased diffusion weighted signal in the periventricular white matter of the left parietal lobe without definite restricted diffusion on the ADC map, possibly a small subacute small vessel infarct. There is no evidence of intracranial hemorrhage, mass, midline shift, or extra-axial fluid collection. Small foci of T2 hyperintensity in the subcortical and deep cerebral white matter have mildly progressed from the prior MRI and are nonspecific but compatible with mild to moderate chronic small vessel ischemic disease. There is mild generalized cerebral atrophy, unchanged. Remote infarcts are noted in the corona radiata.  Orbits are unremarkable. Mild bilateral ethmoid air cell mucosal thickening is noted. Small mastoid effusions are present. Major intracranial vascular flow voids are preserved.  MRA HEAD FINDINGS  Visualized distal vertebral arteries are patent. PICA origins appear patent. AICA and SCA origins appear patent. Basilar artery is patent without stenosis. There is a fetal origin of the left PCA. PCAs are otherwise unremarkable.  Internal carotid arteries are patent from skull base to carotid termini. ACAs and MCAs are unremarkable. No intracranial aneurysm is identified.  IMPRESSION: 1. No  evidence of acute large territory infarct. 2. Possible subacute lacunar infarct in the left parietal periventricular white matter. 3. Mild-to-moderate chronic small vessel ischemic disease, mildly progressed from the prior MRI. 4. No evidence of major intracranial arterial occlusion or significant stenosis.   Electronically Signed   By: Sebastian Ache   On: 11/15/2013 18:45    2D ECHO: Study Conclusions  - Left ventricle: The cavity size was normal. Wall thickness was increased in a pattern of mild LVH. Systolic function was normal. The estimated ejection fraction was in the range of 55% to 60%. Wall motion was normal; there were no regional wall motion abnormalities. - Left atrium: The atrium was severely dilated. - Right ventricle:  The cavity size was mildly dilated. Wall thickness was normal. Impressions:  - No cardiac source of emboli was indentified. Compared to the prior study, there has been no significant interval change.    Disposition and Follow-up:     Discharge Orders   Future Orders Complete By Expires   Diet - low sodium heart healthy  As directed    Increase activity slowly  As directed        DISPOSITION: home  DIET: carb modified diet    DISCHARGE FOLLOW-UP Follow-up Information   Follow up with Herb GraysSPEAR, TAMMY, MD. Schedule an appointment as soon as possible for a visit in 10 days. (for hospital follow-up)    Specialty:  Family Medicine   Contact information:   [address] [facility] [place] [address] [phone]       Follow up with Gates RiggSETHI,PRAMODKUMAR P, MD. Schedule an appointment as soon as possible for a visit in 2 months. (for hospital follow-up, stroke clinic)    Specialties:  Neurology, Radiology   Contact information:   [address] [place] [address] [phone]       Time spent on Discharge: 35 mins  Signed:   Cathren Harshipudeep K Harlean Regula M.D. [facility] 11/16/2013, 1:47 PM Pager:  [phone]   **Disclaimer: This note was dictated with voice recognition software. Similar sounding words can inadvertently be transcribed and this note may contain transcription errors which may not have been corrected upon publication of note.**

## 2013-11-16 NOTE — Progress Notes (Signed)
Stroke Team Progress Note  HISTORY Mason Patton is a 68 y.o. male who works for a [employer]. He was standing looking at a piece of equipment that was above his head for about 12 minutes. He then suddenly noted he felt dizzy and off balance. He denies a sensation of vertigo but more of a light headed sensation. He had no other symptoms of dysarthria, diplopia, nausea, vomiting, neck pain. Due to huis symptoms persisting and a feeling of unsteady gait, he was brought to the ED. Currently he continues to feel dizzy but states if he is reclined the sensation will go away.  Of note patient states he had episode similar to this one year ago. Wife states at that time they were painting and he suddenly became pale and dizzy. This lasted for only 10 minutes.   Date last known well: Date: 11/15/2013  Time last known well: Time: 13:30  tPA Given: No: out of window and NIHSS 0  ABCD2 --5   SUBJECTIVE There are no family members present. The patient feels significantly improved. He denies dizziness. Dr. Pearlean Brownie spoke to the patient regarding the Socrates Trial. The research nurses to meet with the patient. Patient is interested and wants to participate in the trial. He was advised to discontinue Plavix and take the trial medication.  OBJECTIVE Most recent Vital Signs: Filed Vitals:   11/16/13 0000 11/16/13 0200 11/16/13 0400 11/16/13 0600  BP: 124/58 132/66 136/65 130/52  Pulse: 75 82 78 71  Temp:   97.5 F (36.4 C)   TempSrc:   Oral   Resp: 20 20 20 20   Height:      Weight:   271 lb 14.4 oz (123.333 kg)   SpO2: 97% 99% 98% 98%   CBG (last 3)   Recent Labs  11/15/13 1649 11/15/13 2052 11/16/13 0730  GLUCAP 181* 100* 150*    IV Fluid Intake:     MEDICATIONS  . amLODipine  10 mg Oral Daily  . atorvastatin  40 mg Oral q1800  . benazepril  40 mg Oral BID  . clopidogrel  75 mg Oral Q breakfast  . glyBURIDE  5 mg Oral TID WC  . heparin  5,000 Units Subcutaneous 3 times per day  .  levothyroxine  200 mcg Oral QAC breakfast  . metFORMIN  500 mg Oral TID WC  . metoprolol succinate  100 mg Oral Daily  . multivitamin with minerals  1 tablet Oral Daily   PRN:  albuterol, triamcinolone ointment  Diet:  Cardiac thin liquids Activity:   DVT Prophylaxis:  Subcutaneous heparin  CLINICALLY SIGNIFICANT STUDIES Basic Metabolic Panel:  Recent Labs Lab 11/15/13 1645  NA 139  K 4.2  CL 101  CO2 26  GLUCOSE 173*  BUN 16  CREATININE 0.75  CALCIUM 9.0   Liver Function Tests:  Recent Labs Lab 11/15/13 1645  AST 10  ALT 8  ALKPHOS 80  BILITOT 0.4  PROT 6.7  ALBUMIN 3.5   CBC:  Recent Labs Lab 11/15/13 1645  WBC 9.6  NEUTROABS 7.2  HGB 12.7*  HCT 38.4*  MCV 85.7  PLT 201   Coagulation:  Recent Labs Lab 11/15/13 1645  LABPROT 14.0  INR 1.10   Cardiac Enzymes: No results found for this basename: CKTOTAL, CKMB, CKMBINDEX, TROPONINI,  in the last 168 hours Urinalysis: No results found for this basename: COLORURINE, APPERANCEUR, LABSPEC, PHURINE, GLUCOSEU, HGBUR, BILIRUBINUR, KETONESUR, PROTEINUR, UROBILINOGEN, NITRITE, LEUKOCYTESUR,  in the last 168 hours Lipid Panel  Component Value Date/Time   CHOL 97 11/16/2013 0540   TRIG 171* 11/16/2013 0540   HDL 24* 11/16/2013 0540   CHOLHDL 4.0 11/16/2013 0540   VLDL 34 11/16/2013 0540   LDLCALC 39 11/16/2013 0540   HgbA1C  Lab Results  Component Value Date   HGBA1C 7.7* 03/30/2013    Urine Drug Screen:   No results found for this basename: labopia, cocainscrnur, labbenz, amphetmu, thcu, labbarb    Alcohol Level: No results found for this basename: ETH,  in the last 168 hours  Ct Head (brain) Wo Contrast 11/15/2013    1. No acute intracranial process identified. 2. 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      Mr Mason Patton Head Wo Contrast 11/15/2013    1. No evidence of acute large territory infarct. 2. Possible subacute lacunar infarct in the left parietal  periventricular white matter.    Mr Brain Wo Contrast 11/15/2013     Mild-to-moderate chronic small vessel ischemic disease, mildly progressed from the prior MRI. No evidence of major intracranial arterial occlusion or significant stenosis.     2D Echocardiogram  pending  Carotid Doppler  pending  CXR    EKG Sinus rhythm rate 65 beats per minute. For complete results please see formal report.   Therapy Recommendations pending  Physical Exam    Mental Status:  Alert, oriented, thought content appropriate. Speech fluent without evidence of aphasia. Able to follow 3 step commands without difficulty.  Cranial Nerves:  II: Discs flat bilaterally; Visual fields grossly normal, pupils equal, round, reactive to light and accommodation  III,IV, VI: ptosis not present, extra-ocular motions intact bilaterally  V,VII: smile symmetric, facial light touch sensation normal bilaterally  VIII: hearing normal bilaterally  IX,X: gag reflex present  XI: bilateral shoulder shrug  XII: midline tongue extension without atrophy or fasciculations  Motor:  Right : Upper extremity 5/5 Left: Upper extremity 5/5  Lower extremity 5/5 Lower extremity 5/5  Tone and bulk:normal tone throughout; no atrophy noted  Sensory: Pinprick and light touch intact throughout, bilaterally  Deep Tendon Reflexes:  Right: Upper Extremity Left: Upper extremity  biceps (C-5 to C-6) 2/4 biceps (C-5 to C-6) 2/4  tricep (C7) 2/4 triceps (C7) 2/4  Brachioradialis (C6) 2/4 Brachioradialis (C6) 2/4  Lower Extremity Lower Extremity  quadriceps (L-2 to L-4) 2/4 quadriceps (L-2 to L-4) 2/4  Achilles (S1) 1/4 Achilles (S1) 1/4  Plantars:  Right: downgoing Left: downgoing  Cerebellar:  normal finger-to-nose, normal heel-to-shin test  Gait: not tested    ASSESSMENT [name] is a 68 y.o. male presenting with dizziness and balance problems. TPA was not initiated secondary to late presentation. A CT scan of the head  was negative. An MRI showed a possible subacute lacunar infarct in the left parietal periventricular white matter. Infarct felt to be thrombotic secondary to small vessel disease On clopidogrel 75 mg orally every day prior to admission. Now on clopidogrel 75 mg orally every day for secondary stroke prevention. Patient with resultant resolution of deficits. Stroke work up underway.   SOCRATESTrial to be considered.  Hyperlipidemia - on Lipitor prior to admission - cholesterol 97 LDL 39.  Previous stroke  Diabetes mellitus - hemoglobin A1c 7.7 goal less than 7   Hospital day # 1  TREATMENT/PLAN  Continue SOCRATES trial medication ( Aspirin 100 mg versus ticagrelor ) for secondary stroke prevention  Await therapy evaluations  Await 2-D echo and carotid Dopplers  Dc home later.  F/U  with Dr Pearlean Brownie as per SOCRATES study protocol   Delton See PA-C [facility] Pager [phone] 11/16/2013, 10:53 AM  I have personally obtained a history, examined the patient, evaluated imaging results, and formulated the assessment and plan of care. I agree with the above.  Delia Heady, MD   To contact Stroke Continuity provider, please refer to [email or web address]. After hours, contact General Neurology

## 2013-11-16 NOTE — Progress Notes (Signed)
Patient ID: Mason Patton  male  XMD:[id number]    DOB: 1945-09-09    DOA: 11/15/2013  PCP: [name], MD  Assessment/Plan: Principal Problem:   CVA (cerebral infarction) with dizziness - MRI showed left-sided subacute lacunar infarct - Neurology consulted, for stroke workup - 2-D echo, results pending  - carotid Dopplers showed 1-39% ICA stenosis - Continue Plavix daily - PT evaluation shows back to his baseline - LDL 39, continue Lipitor  Active Problems:   Hypertension - Currently stable    DM2 (diabetes mellitus, type 2) - Currently stable, on glyburide and metformin at home  DVT Prophylaxis:  Code Status: Full code  Family Communication:  Disposition:  Consultants:  Neurology  Procedures:  None   Antibiotics:  None    Subjective: Patient seen and examined, denies any specific complaints, states dizziness is improving  Objective: Weight change:   Intake/Output Summary (Last 24 hours) at 11/16/13 1300 Last data filed at 11/16/13 0557  Gross per 24 hour  Intake    240 ml  Output    825 ml  Net   -585 ml   Blood pressure 130/52, pulse 71, temperature 97.5 F (36.4 C), temperature source Oral, resp. rate 20, height 5' 10.5" (1.791 m), weight 123.333 kg (271 lb 14.4 oz), SpO2 98.00%.  Physical Exam: General: Alert and awake, oriented x3, not in any acute distress. CVS: S1-S2 clear, no murmur rubs or gallops Chest: clear to auscultation bilaterally, no wheezing, rales or rhonchi Abdomen: soft nontender, nondistended, normal bowel sounds  Extremities: no cyanosis, clubbing or edema noted bilaterally Neuro: Cranial nerves II-XII intact, no focal neurological deficits  Lab Results: Basic Metabolic Panel:  Recent Labs Lab 11/15/13 1645  NA 139  K 4.2  CL 101  CO2 26  GLUCOSE 173*  BUN 16  CREATININE 0.75  CALCIUM 9.0   Liver Function Tests:  Recent Labs Lab 11/15/13 1645  AST 10  ALT 8  ALKPHOS 80  BILITOT 0.4  PROT 6.7    ALBUMIN 3.5   No results found for this basename: LIPASE, AMYLASE,  in the last 168 hours No results found for this basename: AMMONIA,  in the last 168 hours CBC:  Recent Labs Lab 11/15/13 1645  WBC 9.6  NEUTROABS 7.2  HGB 12.7*  HCT 38.4*  MCV 85.7  PLT 201   Cardiac Enzymes: No results found for this basename: CKTOTAL, CKMB, CKMBINDEX, TROPONINI,  in the last 168 hours BNP: No components found with this basename: POCBNP,  CBG:  Recent Labs Lab 11/15/13 1649 11/15/13 2052 11/16/13 0730 11/16/13 1135  GLUCAP 181* 100* 150* 105*     Micro Results: No results found for this or any previous visit (from the past 240 hour(s)).  Studies/Results: Ct Head (brain) Wo Contrast  11/15/2013   CLINICAL DATA:  Dizziness  EXAM: CT HEAD WITHOUT CONTRAST  TECHNIQUE: Contiguous axial images were obtained from the base of the skull through the vertex without intravenous contrast.  COMPARISON:  Prior MRI from 03/28/2013 and CT from 03/23/2013  FINDINGS: Atrophy with chronic microvascular ischemic changes involving the supratentorial white matter again seen, stable from prior.  There is no acute intracranial hemorrhage or infarct. No mass lesion or midline shift. Gray-white matter differentiation is well maintained. Ventricles are normal in size without evidence of hydrocephalus. No extra-axial fluid collection.  The calvarium is intact.  Orbital soft tissues are within normal limits.  The paranasal sinuses are clear. Scattered opacity within the inferior right mastoid air  cells is unchanged. No left mastoid effusion.  Scalp soft tissues are unremarkable.  IMPRESSION: 1. No acute intracranial process identified. 2. 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   Electronically Signed   By: Rise Mu M.D.   On: 11/15/2013 17:14   Mr Maxine Glenn Head Wo Contrast  11/15/2013   CLINICAL DATA:  Acute onset of dizziness.  Evaluate for stroke.  EXAM: MRI HEAD  WITHOUT CONTRAST  MRA HEAD WITHOUT CONTRAST  TECHNIQUE: Multiplanar, multiecho pulse sequences of the brain and surrounding structures were obtained without intravenous contrast. Angiographic images of the head were obtained using MRA technique without contrast.  COMPARISON:  Head CT 11/15/2013.  Head MRI and MRA 03/28/2013.  FINDINGS: MRI HEAD FINDINGS  There is no definite evidence of acute infarct. There is a 7 mm oblong focus of slightly increased diffusion weighted signal in the periventricular white matter of the left parietal lobe without definite restricted diffusion on the ADC map, possibly a small subacute small vessel infarct. There is no evidence of intracranial hemorrhage, mass, midline shift, or extra-axial fluid collection. Small foci of T2 hyperintensity in the subcortical and deep cerebral white matter have mildly progressed from the prior MRI and are nonspecific but compatible with mild to moderate chronic small vessel ischemic disease. There is mild generalized cerebral atrophy, unchanged. Remote infarcts are noted in the corona radiata.  Orbits are unremarkable. Mild bilateral ethmoid air cell mucosal thickening is noted. Small mastoid effusions are present. Major intracranial vascular flow voids are preserved.  MRA HEAD FINDINGS  Visualized distal vertebral arteries are patent. PICA origins appear patent. AICA and SCA origins appear patent. Basilar artery is patent without stenosis. There is a fetal origin of the left PCA. PCAs are otherwise unremarkable.  Internal carotid arteries are patent from skull base to carotid termini. ACAs and MCAs are unremarkable. No intracranial aneurysm is identified.  IMPRESSION: 1. No evidence of acute large territory infarct. 2. Possible subacute lacunar infarct in the left parietal periventricular white matter. 3. Mild-to-moderate chronic small vessel ischemic disease, mildly progressed from the prior MRI. 4. No evidence of major intracranial arterial occlusion  or significant stenosis.   Electronically Signed   By: Sebastian Ache   On: 11/15/2013 18:45   Mr Brain Wo Contrast  11/15/2013   CLINICAL DATA:  Acute onset of dizziness.  Evaluate for stroke.  EXAM: MRI HEAD WITHOUT CONTRAST  MRA HEAD WITHOUT CONTRAST  TECHNIQUE: Multiplanar, multiecho pulse sequences of the brain and surrounding structures were obtained without intravenous contrast. Angiographic images of the head were obtained using MRA technique without contrast.  COMPARISON:  Head CT 11/15/2013.  Head MRI and MRA 03/28/2013.  FINDINGS: MRI HEAD FINDINGS  There is no definite evidence of acute infarct. There is a 7 mm oblong focus of slightly increased diffusion weighted signal in the periventricular white matter of the left parietal lobe without definite restricted diffusion on the ADC map, possibly a small subacute small vessel infarct. There is no evidence of intracranial hemorrhage, mass, midline shift, or extra-axial fluid collection. Small foci of T2 hyperintensity in the subcortical and deep cerebral white matter have mildly progressed from the prior MRI and are nonspecific but compatible with mild to moderate chronic small vessel ischemic disease. There is mild generalized cerebral atrophy, unchanged. Remote infarcts are noted in the corona radiata.  Orbits are unremarkable. Mild bilateral ethmoid air cell mucosal thickening is noted. Small mastoid effusions are present. Major intracranial vascular flow voids are preserved.  MRA HEAD FINDINGS  Visualized distal vertebral arteries are patent. PICA origins appear patent. AICA and SCA origins appear patent. Basilar artery is patent without stenosis. There is a fetal origin of the left PCA. PCAs are otherwise unremarkable.  Internal carotid arteries are patent from skull base to carotid termini. ACAs and MCAs are unremarkable. No intracranial aneurysm is identified.  IMPRESSION: 1. No evidence of acute large territory infarct. 2. Possible subacute lacunar  infarct in the left parietal periventricular white matter. 3. Mild-to-moderate chronic small vessel ischemic disease, mildly progressed from the prior MRI. 4. No evidence of major intracranial arterial occlusion or significant stenosis.   Electronically Signed   By: Sebastian AcheAllen  Grady   On: 11/15/2013 18:45    Medications: Scheduled Meds: . amLODipine  10 mg Oral Daily  . atorvastatin  40 mg Oral q1800  . benazepril  40 mg Oral BID  . glyBURIDE  5 mg Oral TID WC  . levothyroxine  200 mcg Oral QAC breakfast  . metFORMIN  500 mg Oral TID WC  . metoprolol succinate  100 mg Oral Daily  . multivitamin with minerals  1 tablet Oral Daily  . [START ON 11/17/2013] research study medication  100 mg Oral Daily  . research study medication  90 mg Oral BID      LOS: 1 day   Tyreik Delahoussaye Jenna LuoK Eva Griffo M.D. [facility] 11/16/2013, 1:00 PM Pager: [phone]  If 7PM-7AM, please contact night-coverage [email or web address] Password [email or web address]  **Disclaimer: This note was dictated with voice recognition software. Similar sounding words can inadvertently be transcribed and this note may contain transcription errors which may not have been corrected upon publication of note.**

## 2013-11-16 NOTE — Evaluation (Signed)
Physical Therapy Evaluation Patient Details Name: Mason Patton MRN: [id number] DOB: 1945-10-26 Today's Date: 11/16/2013   History of Present Illness  Pt presented with sudden dizziness found to have subacute lacunar infarct but no acute.   Clinical Impression  Pt moving well without dizziness even with change in position, head turns and gait. Pt reports back to baseline function and states he has noted dizziness in the past with hypoglycemia and relates current episode to the same. Pt educated for symptoms of vertigo and ability to tx if those were to present. Pt currently without limitation or further therapy needs.     Follow Up Recommendations No PT follow up    Equipment Recommendations  None recommended by PT    Recommendations for Other Services       Precautions / Restrictions Precautions Precautions: None      Mobility  Bed Mobility               General bed mobility comments: in chair on arrival  Transfers Overall transfer level: Modified independent                  Ambulation/Gait Ambulation/Gait assistance: Modified independent (Device/Increase time) Ambulation Distance (Feet): 400 Feet Assistive device: None Gait Pattern/deviations: WFL(Within Functional Limits)   Gait velocity interpretation: at or above normal speed for age/gender    Stairs Stairs: Yes Stairs assistance: Modified independent (Device/Increase time) Stair Management: One rail Right;Step to pattern;Forwards Number of Stairs: 5    Wheelchair Mobility    Modified Rankin (Stroke Patients Only) Modified Rankin (Stroke Patients Only) Pre-Morbid Rankin Score: No symptoms Modified Rankin: No symptoms     Balance Overall balance assessment: No apparent balance deficits (not formally assessed)                                           Pertinent Vitals/Pain No pain    Home Living Family/patient expects to be discharged to:: Private  residence Living Arrangements: Spouse/significant other Available Help at Discharge: Available 24 hours/day Type of Home: House Home Access: Stairs to enter Entrance Stairs-Rails: Doctor, general practice of Steps: 5-6 Home Layout: One level Home Equipment: Environmental consultant - 2 wheels      Prior Function Level of Independence: Independent               Hand Dominance        Extremity/Trunk Assessment   Upper Extremity Assessment: Overall WFL for tasks assessed           Lower Extremity Assessment: Overall WFL for tasks assessed      Cervical / Trunk Assessment: Normal  Communication   Communication: No difficulties  Cognition Arousal/Alertness: Awake/alert Behavior During Therapy: WFL for tasks assessed/performed Overall Cognitive Status: Within Functional Limits for tasks assessed                      General Comments General comments (skin integrity, edema, etc.): Pt able to complete gait with head turns horizontal and vertical, change of direction and speed all without LOB. Pt performed visual tracking and saccades in all direction without nystagmus, loss of target or dizziness    Exercises        Assessment/Plan    PT Assessment Patent does not need any further PT services  PT Diagnosis     PT Problem List    PT Treatment Interventions  PT Goals (Current goals can be found in the Care Plan section) Acute Rehab PT Goals PT Goal Formulation: No goals set, d/c therapy    Frequency     Barriers to discharge        Co-evaluation               End of Session   Activity Tolerance: Patient tolerated treatment well Patient left: in chair;with call bell/phone within reach Nurse Communication: Mobility status         Time: 1131-1140 PT Time Calculation (min): 9 min   Charges:   PT Evaluation $Initial PT Evaluation Tier I: 1 Procedure     PT G Codes:          Jose Corvin B Yazmen Briones 11/16/2013, 11:45 AM Delaney MeigsMaija Tabor Barney Gertsch,  PT [phone]

## 2013-11-16 NOTE — Progress Notes (Signed)
VASCULAR LAB PRELIMINARY  PRELIMINARY  PRELIMINARY  PRELIMINARY  Carotid duplex  completed.    Preliminary report:  Bilateral:  1-39% ICA stenosis.  Vertebral artery flow is antegrade.      Gara Kroner, RVT 11/16/2013, 10:50 AM

## 2013-11-17 ENCOUNTER — Telehealth: Payer: Self-pay | Admitting: Nurse Practitioner

## 2013-11-17 NOTE — Telephone Encounter (Signed)
Called pt and pt stated that he is going to need a letter to return to work. Pt was seen in the ED by Dr. Pearlean Brownie on 11/15/13. Please advise

## 2013-11-17 NOTE — Telephone Encounter (Signed)
States he needs a letter to return to work. Please call

## 2013-11-18 LAB — GLUCOSE, CAPILLARY
Comment 2: 9466478
GLUCOSE-CAPILLARY: 115 mg/dL — AB (ref 70–99)

## 2013-11-24 ENCOUNTER — Telehealth: Payer: Self-pay | Admitting: Neurology

## 2013-11-24 NOTE — Telephone Encounter (Signed)
Patient was seen in the hospital 5-12 and was told by Dr. Terrace Arabia to see Dr. Pearlean Brownie today--patient is not on the schedule--patient says he needs to be seen today--please call--thank you.

## 2013-11-24 NOTE — Telephone Encounter (Signed)
Gave pt's information to the research dept.

## 2013-11-26 ENCOUNTER — Telehealth: Payer: Self-pay | Admitting: Neurology

## 2013-11-26 NOTE — Telephone Encounter (Signed)
Steward Drone from [employer] is faxing over a form that needs to be filled out by Dr. Pearlean Brownie saying that patient is able to do the physical requirements of the job. Dr. Pearlean Brownie has already released him for work but they still need him to read over the physical tasks of the job and state that he is able to do them. If questions, please call her at her direct line.

## 2014-02-14 ENCOUNTER — Encounter (INDEPENDENT_AMBULATORY_CARE_PROVIDER_SITE_OTHER): Payer: Self-pay

## 2014-02-14 DIAGNOSIS — Z0289 Encounter for other administrative examinations: Secondary | ICD-10-CM

## 2014-02-17 NOTE — Progress Notes (Signed)
Addendum to P.T. Progress note from 12-Dec-2013  12-12-2013 1144  PT G-Codes **NOT FOR INPATIENT CLASS**  Functional Assessment Tool Used clinical judgement  Functional Limitation Mobility: Walking and moving around  Mobility: Walking and Moving Around Current Status [phone]) [facility]  Mobility: Walking and Moving Around Goal Status [phone]) [facility]  Mobility: Walking and Moving Around Discharge Status [phone]) [facility]   Navea Woodrow Abner Greenspan, PT [phone]

## 2014-03-16 ENCOUNTER — Telehealth: Payer: Self-pay

## 2014-03-16 NOTE — Telephone Encounter (Signed)
Subject was contacted for their 120 Day follow-up visit in the [employer] study. All required questionnaires was answered. Subject has no AE's or SAE's to report. Subject completed study and has been deactivated from the [employer] study. No further contact with is subject will be done by the Research department.  

## 2014-03-29 NOTE — Telephone Encounter (Signed)
Ok to give letter to return to work without restrictions

## 2014-03-30 NOTE — Telephone Encounter (Signed)
Information sent to Dr. Marlis Edelson assistant, [place], [place]. Please advise

## 2014-03-30 NOTE — Telephone Encounter (Signed)
Called patient and he stated he has already returned to work. So he does not need a note

## 2014-05-17 ENCOUNTER — Ambulatory Visit: Payer: 59 | Admitting: Nurse Practitioner

## 2014-05-18 ENCOUNTER — Encounter: Payer: Self-pay | Admitting: *Deleted

## 2014-05-31 ENCOUNTER — Encounter: Payer: Self-pay | Admitting: Nurse Practitioner

## 2014-05-31 ENCOUNTER — Ambulatory Visit (INDEPENDENT_AMBULATORY_CARE_PROVIDER_SITE_OTHER): Payer: Medicare Other | Admitting: Nurse Practitioner

## 2014-05-31 VITALS — BP 150/68 | HR 57 | Temp 97.1°F | Resp 20 | Ht 70.5 in | Wt 287.6 lb

## 2014-05-31 DIAGNOSIS — I6381 Other cerebral infarction due to occlusion or stenosis of small artery: Secondary | ICD-10-CM

## 2014-05-31 DIAGNOSIS — I639 Cerebral infarction, unspecified: Secondary | ICD-10-CM

## 2014-05-31 DIAGNOSIS — I10 Essential (primary) hypertension: Secondary | ICD-10-CM

## 2014-05-31 NOTE — Progress Notes (Signed)
[facility]  PATIENT: Mason LeavenDonald R Patton DOB: 04/14/1946   REASON FOR VISIT: Follow-up for left-sided lacunar infarct   HISTORY OF PRESENT ILLNESS: [age] year Caucasian male who was admitted on 03/22/13 with 1 week h/o headache being treated by primary MD as sinusitis with amoxcicillin and allegra. 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 Spinal tap showed mild elevated protein and csf lymphocytosis. CSF grew no organism and viral titres were negative except elevated [place] virus IgG. MRI brain showed tiny 3 mm left frontal DWI positive lesion and several white matter T 2 shine thru hyperintensities in right corona radiata and occipital lobes consisted with old silent infarcts. He denied any focal left brain symptoms or prior h/o stroke or TIAs.He was started on plavix which he is tolerating well.He has multiple vascular risk factors of HT, diabetes, lipids, obesity and sleep apnoea. Carotid dopplers, intracranial MRA, echo were normal.He has remote h/o transient atrial fibrillation in setting of thyroid dysfunction 10 years ago.   UPDATE 10/29/13 (LL): [name] returns for stroke follow up. His 3 week cardiac monitor did not show evidence of atrial fibrillation. He states he feels well and is back to work. He states his blood pressure is well controlled, but it is elevated in the office today at 153/70. He does not feel like he has any residual effects from the stroke. He is tolerating Plavix well with no signs of significant bleeding or bruising. He wears his Cpap each night but states he wakes frequently. He unfortunately started back smoking again. UPDATE11/24/15 [name] 68 year old male returns for follow-up. He was last seen in this office April 24 and had admission to the hospital 11/15/2013 for dizziness lasting approximately 3 hours. MRI of the brain with left-sided lacunar infarct. 2-D  echo showed ejection fraction 55-60%. Carotid Dopplers with 1-39% ICA stenosis. Hemoglobin A1c 7.2. His Plavix was stopped and he was switched to Aggrenox. He is currently back at work and has not had further stroke or TIA symptoms. He returns for reevaluation.   REVIEW OF SYSTEMS: Full 14 system review of systems performed and notable only for those listed, all others are neg:  Constitutional: N/A  Cardiovascular: N/A  Ear/Nose/Throat: N/A  Skin: N/A  Eyes: N/A  Respiratory: N/A  Gastroitestinal: N/A  Hematology/Lymphatic: N/A  Endocrine: N/A Musculoskeletal:N/A  Allergy/Immunology: N/A  Neurological: N/A Psychiatric: N/A Sleep : NA   ALLERGIES: Allergies  Allergen Reactions  . Codeine Nausea And Vomiting  . Demerol Nausea And Vomiting    HOME MEDICATIONS: Outpatient Prescriptions Prior to Visit  Medication Sig Dispense Refill  . amLODipine (NORVASC) 10 MG tablet Take 10 mg by mouth daily.    [name]. atorvastatin (LIPITOR) 40 MG tablet Take 1 tablet (40 mg total) by mouth daily. 30 tablet 0  . benazepril (LOTENSIN) 40 MG tablet Take 40 mg by mouth 2 (two) times daily.    [name]. glyBURIDE-metformin (GLUCOVANCE) 5-500 MG per tablet Take 1 tablet by mouth 3 (three) times daily.     [name]. levothyroxine (SYNTHROID, LEVOTHROID) 200 MCG tablet Take 200 mcg by mouth daily before breakfast.     . metoprolol succinate (TOPROL-XL) 100 MG 24 hr tablet Take 100 mg by mouth daily. Take with or immediately following a meal.    . Multiple Vitamin (MULTIVITAMIN WITH MINERALS) TABS tablet Take 1 tablet by mouth daily.    . research study medication Take 100 mg by mouth daily.    .[name]  research study medication Take 90 mg by mouth 2 (two) times daily.    [name] triamcinolone ointment (KENALOG) 0.1 % Apply 1 application topically 2 (two) times daily as needed (rash).    [name] albuterol (PROVENTIL HFA;VENTOLIN HFA) 108 (90 BASE) MCG/ACT inhaler Inhale 2 puffs into the lungs every 4 (four) hours as needed for wheezing or  shortness of breath.     No facility-administered medications prior to visit.    PAST MEDICAL HISTORY: Past Medical History  Diagnosis Date  . Hypertension   . Thyroid disease   . Gallstones and inflammation of gallbladder without obstruction   . Cholecystitis with cholelithiasis   . Arthritis   . Emphysema of lung   . Hyperlipidemia   . Eczema   . Umbilical hernia   . Tobacco abuse     quit 11/08/2012  . Obesity   . History of nuclear stress test 02/23/2010    dipyridamole; normal pattern of perfusion in all regions; normal, low risk study   . Meningoencephalitis   . Shortness of breath   . Heart murmur   . Pneumonia     "twice" (05/19/2013)  . OSA on CPAP     last sleep study >8 yrs  . 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 Type II diabetes mellitus   . Stroke     "they said I had a minor one when I had menigitis" (05/19/2013)    PAST SURGICAL HISTORY: Past Surgical History  Procedure Laterality Date  . Knee arthroscopy Right     "w2 times" (05/19/2013)  . Cholecystectomy  09/09/2011    Procedure: LAPAROSCOPIC CHOLECYSTECTOMY WITH INTRAOPERATIVE CHOLANGIOGRAM;  Surgeon: Cherylynn Ridges III, MD;  Location: [facility] OR;  Service: General;  Laterality: N/A;  . Transthoracic echocardiogram  03/28/2005    EF=>55%, mod conc LVH; LA mod dilated & borderline RA enlargement; mild TR; mild pulm valve regurg  . Joint replacement    . Tonsillectomy    . Hernia repair  04/2006    UHR  . 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 Total knee arthroplasty Left 05/19/2013    Procedure: TOTAL KNEE ARTHROPLASTY;  Surgeon: Nadara Mustard, MD;  Location: [facility] OR;  Service: Orthopedics;  Laterality: Left;  Left Total Knee Arthroplasty    FAMILY HISTORY: Family History  Problem Relation Age of Onset  . Cancer Maternal Aunt     colon  . Cancer Maternal Aunt     lung  . Thyroid disease Mother   . Hyperlipidemia Father   . Diabetes Maternal Grandmother   . Asthma  Child   . Diabetes type I Child     SOCIAL HISTORY: History   Social History  . Marital Status: Married    Spouse Name: sandra    Number of Children: 3  . Years of Education: tech   Occupational History  .     Social History Main Topics  . Smoking status: Current Every Day Smoker -- 0.50 packs/day for 50 years    Types: Cigarettes  . Smokeless tobacco: Never Used  . Alcohol Use: No  . Drug Use: No  . Sexual Activity: Yes   Other Topics Concern  . Not on file   Social History Narrative     PHYSICAL EXAM  Filed Vitals:   05/31/14 1337  BP: 150/68  Pulse: 57  Temp: 97.1 F (36.2 C)  TempSrc: Oral  Resp: 20  Height: 5' 10.5" (1.791 m)  Weight: 287 lb  9.6 oz (130.455 kg)   Body mass index is 40.67 kg/(m^2). General: obese middle aged Caucasian male seated, in no evident distress  Head: head normocephalic and atraumatic. Orohparynx benign  Neck: supple with no carotid or supraclavicular bruits  Cardiovascular: regular rate and rhythm, no murmurs  Musculoskeletal: left knee pain  Skin: no rash/petichiae   Neurologic Exam  Mental Status: Awake and fully alert. Oriented to place and time. Recent and remote memory intact. Attention span, concentration and fund of knowledge appropriate. Mood and affect appropriate.  Cranial Nerves: Pupils equal, briskly reactive to light. Extraocular movements full without nystagmus. Visual fields full to confrontation. Hearing intact. Facial sensation intact. Face, tongue, palate moves normally and symmetrically.  Motor: Normal bulk and tone. Normal strength in all tested extremity muscles. No focal weakness Sensory: intact to touch  Coordination: Rapid alternating movements normal in all extremities. Finger-to-nose and heel-to-shin performed accurately bilaterally.  Gait and Station: Arises from chair with mild difficulty. Stance is normal. Gait demonstrates normal stride length and balance . Tandem gait is stable   Reflexes: 1+ and symmetric.  DIAGNOSTIC DATA (LABS, IMAGING, TESTING) - I reviewed patient records, labs, notes, testing and imaging myself where available.  Lab Results  Component Value Date   WBC 9.6 11/15/2013   HGB 12.7* 11/15/2013   HCT 38.4* 11/15/2013   MCV 85.7 11/15/2013   PLT 201 11/15/2013      Component Value Date/Time   NA 139 11/15/2013 1645   K 4.2 11/15/2013 1645   CL 101 11/15/2013 1645   CO2 26 11/15/2013 1645   GLUCOSE 173* 11/15/2013 1645   BUN 16 11/15/2013 1645   CREATININE 0.75 11/15/2013 1645   CALCIUM 9.0 11/15/2013 1645   PROT 6.7 11/15/2013 1645   ALBUMIN 3.5 11/15/2013 1645   AST 10 11/15/2013 1645   ALT 8 11/15/2013 1645   ALKPHOS 80 11/15/2013 1645   BILITOT 0.4 11/15/2013 1645   GFRNONAA >90 11/15/2013 1645   GFRAA >90 11/15/2013 1645   Lab Results  Component Value Date   CHOL 97 11/16/2013   HDL 24* 11/16/2013   LDLCALC 39 11/16/2013   TRIG 171* 11/16/2013   CHOLHDL 4.0 11/16/2013   Lab Results  Component Value Date   HGBA1C 7.2* 11/16/2013   ASSESSMENT AND PLAN  68 y.o. year old male  has a past medical history of Hypertension;  Emphysema of lung; Hyperlipidemia;  Obesity;  OSA on CPAP;  Type II diabetes mellitus; and left-sided lacunar infarct 11/15/2013  here to follow-up.The patient is a current patient of Dr. Pearlean Brownie  who is out of the office today . This note is sent to the work in doctor.     Continue Aggrenox for secondary stroke prevention will refill Control of hypertension with blood pressure below 130/90 LDL cholesterol below 70% Use CPAP every night at 14 cm for sleep apnea Follow-up in 6 months repeat carotid doppler at next visit Nilda Riggs, [facility], [facility], APRN  [facility] [address], [address] [place], [place] [address] [phone]

## 2014-05-31 NOTE — Progress Notes (Signed)
I agree with the assessment and plan as directed by NP .The patient is known to me .   Decklyn Hornik, MD  

## 2014-05-31 NOTE — Patient Instructions (Signed)
Continue Aggrenox for secondary stroke prevention Control of hypertension with blood pressure below 130/90 LDL cholesterol below 70% Use CPAP every night at 14 cm for sleep apnea Follow-up in 6 months

## 2014-07-27 ENCOUNTER — Encounter: Payer: Self-pay | Admitting: Gastroenterology

## 2014-09-12 ENCOUNTER — Other Ambulatory Visit: Payer: Self-pay | Admitting: Neurology

## 2014-09-14 ENCOUNTER — Telehealth: Payer: Self-pay | Admitting: Neurology

## 2014-09-14 NOTE — Telephone Encounter (Addendum)
Patient is calling in regard to Rx Aggrenox 200-25 mg 12 hr release to [facility].  He is completely out and needs refill.   Please call.

## 2014-09-14 NOTE — Telephone Encounter (Signed)
Rx has already been sent.  I called back.  He is aware.

## 2014-09-14 NOTE — Telephone Encounter (Signed)
The clinic was supposed to being doing refills, however, message was sent to me regarding this Rx today, so I have sent it.

## 2014-11-07 ENCOUNTER — Encounter (HOSPITAL_COMMUNITY): Payer: Self-pay | Admitting: *Deleted

## 2014-11-07 ENCOUNTER — Emergency Department (HOSPITAL_COMMUNITY): Payer: 59

## 2014-11-07 ENCOUNTER — Emergency Department (HOSPITAL_COMMUNITY)
Admission: EM | Admit: 2014-11-07 | Discharge: 2014-11-07 | Disposition: A | Discharge status: Home / Self Care | Payer: 59 | Attending: Emergency Medicine | Admitting: Emergency Medicine

## 2014-11-07 DIAGNOSIS — Z7952 Long term (current) use of systemic steroids: Secondary | ICD-10-CM | POA: Diagnosis not present

## 2014-11-07 DIAGNOSIS — I10 Essential (primary) hypertension: Secondary | ICD-10-CM | POA: Insufficient documentation

## 2014-11-07 DIAGNOSIS — Z720 Tobacco use: Secondary | ICD-10-CM | POA: Insufficient documentation

## 2014-11-07 DIAGNOSIS — R112 Nausea with vomiting, unspecified: Secondary | ICD-10-CM | POA: Diagnosis not present

## 2014-11-07 DIAGNOSIS — Z8673 Personal history of transient ischemic attack (TIA), and cerebral infarction without residual deficits: Secondary | ICD-10-CM | POA: Insufficient documentation

## 2014-11-07 DIAGNOSIS — E785 Hyperlipidemia, unspecified: Secondary | ICD-10-CM | POA: Diagnosis not present

## 2014-11-07 DIAGNOSIS — G4733 Obstructive sleep apnea (adult) (pediatric): Secondary | ICD-10-CM | POA: Diagnosis not present

## 2014-11-07 DIAGNOSIS — R062 Wheezing: Secondary | ICD-10-CM | POA: Insufficient documentation

## 2014-11-07 DIAGNOSIS — E119 Type 2 diabetes mellitus without complications: Secondary | ICD-10-CM | POA: Diagnosis not present

## 2014-11-07 DIAGNOSIS — J439 Emphysema, unspecified: Secondary | ICD-10-CM | POA: Diagnosis not present

## 2014-11-07 DIAGNOSIS — Z872 Personal history of diseases of the skin and subcutaneous tissue: Secondary | ICD-10-CM | POA: Insufficient documentation

## 2014-11-07 DIAGNOSIS — Z79899 Other long term (current) drug therapy: Secondary | ICD-10-CM | POA: Insufficient documentation

## 2014-11-07 DIAGNOSIS — R011 Cardiac murmur, unspecified: Secondary | ICD-10-CM | POA: Diagnosis not present

## 2014-11-07 DIAGNOSIS — E669 Obesity, unspecified: Secondary | ICD-10-CM | POA: Insufficient documentation

## 2014-11-07 DIAGNOSIS — Z8701 Personal history of pneumonia (recurrent): Secondary | ICD-10-CM | POA: Diagnosis not present

## 2014-11-07 DIAGNOSIS — Z9981 Dependence on supplemental oxygen: Secondary | ICD-10-CM | POA: Diagnosis not present

## 2014-11-07 DIAGNOSIS — R079 Chest pain, unspecified: Secondary | ICD-10-CM | POA: Diagnosis not present

## 2014-11-07 DIAGNOSIS — Z8739 Personal history of other diseases of the musculoskeletal system and connective tissue: Secondary | ICD-10-CM | POA: Insufficient documentation

## 2014-11-07 DIAGNOSIS — Z8719 Personal history of other diseases of the digestive system: Secondary | ICD-10-CM | POA: Diagnosis not present

## 2014-11-07 DIAGNOSIS — E039 Hypothyroidism, unspecified: Secondary | ICD-10-CM | POA: Insufficient documentation

## 2014-11-07 LAB — COMPREHENSIVE METABOLIC PANEL
ALT: 14 U/L — AB (ref 17–63)
AST: 17 U/L (ref 15–41)
Albumin: 3.8 g/dL (ref 3.5–5.0)
Alkaline Phosphatase: 78 U/L (ref 38–126)
Anion gap: 10 (ref 5–15)
BUN: 10 mg/dL (ref 6–20)
CHLORIDE: 102 mmol/L (ref 101–111)
CO2: 28 mmol/L (ref 22–32)
CREATININE: 0.63 mg/dL (ref 0.61–1.24)
Calcium: 9.1 mg/dL (ref 8.9–10.3)
Glucose, Bld: 141 mg/dL — ABNORMAL HIGH (ref 70–99)
Potassium: 3.7 mmol/L (ref 3.5–5.1)
SODIUM: 140 mmol/L (ref 135–145)
Total Bilirubin: 1 mg/dL (ref 0.3–1.2)
Total Protein: 6.7 g/dL (ref 6.5–8.1)

## 2014-11-07 LAB — CBC
HCT: 40.9 % (ref 39.0–52.0)
Hemoglobin: 14.1 g/dL (ref 13.0–17.0)
MCH: 30.5 pg (ref 26.0–34.0)
MCHC: 34.5 g/dL (ref 30.0–36.0)
MCV: 88.5 fL (ref 78.0–100.0)
PLATELETS: 196 10*3/uL (ref 150–400)
RBC: 4.62 MIL/uL (ref 4.22–5.81)
RDW: 13.1 % (ref 11.5–15.5)
WBC: 9.9 10*3/uL (ref 4.0–10.5)

## 2014-11-07 LAB — I-STAT TROPONIN, ED
TROPONIN I, POC: 0 ng/mL (ref 0.00–0.08)
TROPONIN I, POC: 0.01 ng/mL (ref 0.00–0.08)

## 2014-11-07 NOTE — ED Notes (Signed)
Pt states was sitting at work when he began having central chest pain.  States same on Friday, but it was accompanied by nausea and sweating.

## 2014-11-07 NOTE — ED Provider Notes (Signed)
CSN: [id number]     Arrival date & time 11/07/14  4098 History   First MD Initiated Contact with Patient 11/07/14 1012     Chief Complaint  Patient presents with  . Chest Pain     (Consider location/radiation/quality/duration/timing/severity/associated sxs/prior Treatment) Patient is a 69 y.o. male presenting with chest pain.  Chest Pain Pain location:  Substernal area Pain quality: dull   Pain radiates to:  Does not radiate Pain radiates to the back: no   Pain severity:  Moderate Onset quality:  Gradual Duration:  1 hour Timing:  Intermittent Progression:  Waxing and waning Chronicity:  New Context: at rest   Relieved by:  Nothing Worsened by:  Nothing tried Ineffective treatments:  None tried Associated symptoms: nausea and vomiting   Associated symptoms: no abdominal pain     Past Medical History  Diagnosis Date  . Hypertension   . Thyroid disease   . Gallstones and inflammation of gallbladder without obstruction   . Cholecystitis with cholelithiasis   . Arthritis   . Emphysema of lung   . Hyperlipidemia   . Eczema   . Umbilical hernia   . Tobacco abuse     quit 11/08/2012  . Obesity   . History of nuclear stress test 02/23/2010    dipyridamole; normal pattern of perfusion in all regions; normal, low risk study   . Meningoencephalitis   . Shortness of breath   . Heart murmur   . Pneumonia     "twice" (05/19/2013)  . OSA on CPAP     last sleep study >8 yrs  . 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 Type II diabetes mellitus   . Stroke     "they said I had a minor one when I had menigitis" (05/19/2013)   Past Surgical History  Procedure Laterality Date  . Knee arthroscopy Right     "w2 times" (05/19/2013)  . Cholecystectomy  09/09/2011    Procedure: LAPAROSCOPIC CHOLECYSTECTOMY WITH INTRAOPERATIVE CHOLANGIOGRAM;  Surgeon: Cherylynn Ridges III, MD;  Location: [facility] OR;  Service: General;  Laterality: N/A;  . Transthoracic echocardiogram   03/28/2005    EF=>55%, mod conc LVH; LA mod dilated & borderline RA enlargement; mild TR; mild pulm valve regurg  . Joint replacement    . Tonsillectomy    . Hernia repair  04/2006    UHR  . 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 Cancer Maternal Aunt     colon  . Cancer Maternal Aunt     lung  . Thyroid disease Mother   . Hyperlipidemia Father   . Diabetes Maternal Grandmother   . Asthma Child   . Diabetes type I Child    History  Substance Use Topics  . Smoking status: Current Every Day Smoker -- 0.50 packs/day for 50 years    Types: Cigarettes  . Smokeless tobacco: Never Used  . Alcohol Use: No    Review of Systems  Cardiovascular: Positive for chest pain.  Gastrointestinal: Positive for nausea and vomiting. Negative for abdominal pain.  All other systems reviewed and are negative.     Allergies  Codeine and Demerol  Home Medications   Prior to Admission medications   Medication  Sig Start Date End Date Taking? Authorizing Provider  amLODipine (NORVASC) 10 MG tablet Take 10 mg by mouth daily.    Historical Provider, MD  atorvastatin (LIPITOR) 40 MG tablet Take 1 tablet (40 mg total) by mouth daily. 03/30/13   [place]anford Cyndie Mull, MD  benazepril (LOTENSIN) 40 MG tablet Take 40 mg by mouth 2 (two) times daily.    Historical Provider, MD  dipyridamole-aspirin (AGGRENOX) 200-25 MG per 12 hr capsule TAKE ONE CAPSULE BY MOUTH TWICE A DAY 09/14/14   Micki Riley, MD  glyBURIDE-metformin (GLUCOVANCE) 5-500 MG per tablet Take 2 tablets by mouth 2 (two) times daily with a meal.     Historical Provider, MD  levothyroxine (SYNTHROID, LEVOTHROID) 200 MCG tablet Take 200 mcg by mouth daily before breakfast.     Historical Provider,  MD  metoprolol succinate (TOPROL-XL) 100 MG 24 hr tablet Take 100 mg by mouth daily. Take with or immediately following a meal.    Historical Provider, MD  Multiple Vitamin (MULTIVITAMIN WITH MINERALS) TABS tablet Take 1 tablet by mouth daily.    Historical Provider, MD  triamcinolone cream (KENALOG) 0.1 % Apply 1 application topically 2 (two) times daily.  03/08/14   Historical Provider, MD  Vitamin D, Ergocalciferol, (DRISDOL) 50000 UNITS CAPS capsule Take 50,000 Units by mouth every 7 (seven) days.  05/22/14   Historical Provider, MD   BP 161/66 mmHg  Pulse 56  Temp(Src) 97.9 F (36.6 C) (Oral)  Resp 20  SpO2 98% Physical Exam  Constitutional: He is oriented to person, place, and time. He appears well-developed and well-nourished.  HENT:  Head: Normocephalic and atraumatic.  Eyes: Conjunctivae and EOM are normal.  Neck: Normal range of motion. Neck supple.  Cardiovascular: Normal rate, regular rhythm and normal heart sounds.   Pulmonary/Chest: Effort normal. No respiratory distress. He has wheezes (bil all fields).  Abdominal: He exhibits no distension. There is no tenderness. There is no rebound and no guarding.  Musculoskeletal: Normal range of motion.  Neurological: He is alert and oriented to person, place, and time.  Skin: Skin is warm and dry.  Vitals reviewed.   ED Course  Procedures (including critical care time) Labs Review Labs Reviewed  COMPREHENSIVE METABOLIC PANEL - Abnormal; Notable for the following:    Glucose, Bld 141 (*)    ALT 14 (*)    All other components within normal limits  CBC  I-STAT TROPOININ, ED  Rosezena Sensor, ED    Imaging Review Dg Chest 2 View  11/07/2014   CLINICAL DATA:  Chest pain.  EXAM: CHEST  2 VIEW  COMPARISON:  07/ 19/2011 .  FINDINGS: Cardiomegaly. Pulmonary vascularity normal. No focal infiltrate. Basilar pleural parenchymal thickening consistent scarring slight nodularity left apex is unchanged consistent with scarring and/or  prior granulomas disease. Chronic interstitial changes noted consistent chronic interstitial lung disease. No acute bony abnormality.  IMPRESSION: 1. Stable cardiomegaly. 2. 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 No acute pulmonary disease.   Electronically Signed   By: Maisie Fus  Register   On: 11/07/2014 10:22     EKG Interpretation   Date/Time:  Monday Nov 07 2014 09:37:07 EDT Ventricular Rate:  64 PR Interval:  194 QRS Duration: 106 QT Interval:  444 QTC Calculation: 458 R Axis:   -64 Text Interpretation:  Normal sinus rhythm Left anterior fascicular block  Cannot rule out Inferior infarct (masked by fascicular block?) , age  undetermined Possible Anterior infarct , age undetermined Abnormal ECG No  significant  change since last tracing Confirmed by Mirian Mo [phone])  on 11/07/2014 10:21:33 AM      MDM   Final diagnoses:  Chest pain, unspecified chest pain type    69 y.o. male with pertinent PMH of HTN, prior meningoencephalitis presents with chest pain as above.  History atypical, nonexertional.  He is in minimal to no pain on my examination, states last pain was approximately 3 hours PTA.  Physical exam benign.    Wu with negative delta trop.  Continued to be chest pain free.  DC home to fu with cardiology.  I have reviewed all laboratory and imaging studies if ordered as above  1. Chest pain, unspecified chest pain type         Mirian Mo, MD 11/07/14 [phone]

## 2014-11-07 NOTE — Discharge Instructions (Signed)

## 2014-11-28 ENCOUNTER — Ambulatory Visit: Payer: Medicare Other | Admitting: Physician Assistant

## 2014-12-08 ENCOUNTER — Ambulatory Visit: Payer: Medicare Other | Admitting: Internal Medicine

## 2014-12-26 ENCOUNTER — Ambulatory Visit (INDEPENDENT_AMBULATORY_CARE_PROVIDER_SITE_OTHER): Payer: Medicare Other | Admitting: Nurse Practitioner

## 2014-12-26 ENCOUNTER — Encounter: Payer: Self-pay | Admitting: Nurse Practitioner

## 2014-12-26 VITALS — BP 155/75 | HR 69 | Ht 70.0 in | Wt 281.8 lb

## 2014-12-26 DIAGNOSIS — I6381 Other cerebral infarction due to occlusion or stenosis of small artery: Secondary | ICD-10-CM

## 2014-12-26 DIAGNOSIS — I6789 Other cerebrovascular disease: Secondary | ICD-10-CM

## 2014-12-26 DIAGNOSIS — G4730 Sleep apnea, unspecified: Secondary | ICD-10-CM

## 2014-12-26 DIAGNOSIS — I10 Essential (primary) hypertension: Secondary | ICD-10-CM

## 2014-12-26 DIAGNOSIS — I639 Cerebral infarction, unspecified: Secondary | ICD-10-CM | POA: Diagnosis not present

## 2014-12-26 MED ORDER — ASPIRIN-DIPYRIDAMOLE ER 25-200 MG PO CP12: 1.0000 | ORAL_CAPSULE | Freq: Two times a day (BID) | ORAL | Status: DC

## 2014-12-26 NOTE — Patient Instructions (Signed)
Recent carotid Doppler with Dr. Nadara Eaton Continue Aggrenox for secondary stroke prevention will refill Control of hypertension with blood pressure below 130/90 today's reading 155/75 Keep LDL cholesterol below 70%.  Continue to use CPAP every night 14 cm Follow-up in 6-8 months next visit with Dr. Pearlean Brownie

## 2014-12-26 NOTE — Progress Notes (Signed)
[facility]  PATIENT: Mason Patton DOB: 28-Dec-1945   REASON FOR VISIT: Follow-up for left-sided lacunar infarct  HISTORY FROM: Patient    HISTORY OF PRESENT ILLNESS:67 year Caucasian male who was admitted on 03/22/13 with 1 week h/o headache being treated by primary MD as sinusitis with amoxcicillin and allegra. 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 Spinal tap showed mild elevated protein and csf lymphocytosis. CSF grew no organism and viral titres were negative except elevated west nile virus IgG. MRI brain showed tiny 3 mm left frontal DWI positive lesion and several white matter T 2 shine thru hyperintensities in right corona radiata and occipital lobes consisted with old silent infarcts. He denied any focal left brain symptoms or prior h/o stroke or TIAs.He was started on plavix which he is tolerating well.He has multiple vascular risk factors of HT, diabetes, lipids, obesity and sleep apnoea. Carotid dopplers, intracranial MRA, echo were normal.He has remote h/o transient atrial fibrillation in setting of thyroid dysfunction 10 years ago.  UPDATE11/24/15 [name] 69 year old male returns for follow-up. He was last seen in this office April 24 and had admission to the hospital 11/15/2013 for dizziness lasting approximately 3 hours. MRI of the brain with left-sided lacunar infarct. 2-D echo showed ejection fraction 55-60%. Carotid Dopplers with 1-39% ICA stenosis. Hemoglobin A1c 7.2. His Plavix was stopped and he was switched to Aggrenox. He is currently back at work and has not had further stroke or TIA symptoms. He returns for reevaluation.  Update 12/26/14 [name], 69 year old male returns for follow-up. He was last seen in the office 05/31/2014. He has a history of lacunar infarcts. He is currently on Aggrenox  after admission May 2015 for dizziness lasting approximately 3 hours. He has not  had further episodes. His blood pressure is elevated today at 155/75 and the office, he says he has had recent medication changes by Dr. Newt Lukes. He had recent carotid Doppler as well I do not have those results. He is currently working full-time. He has obstructive sleep apnea and uses CPAP. He returns for reevaluation   REVIEW OF SYSTEMS: Full 14 system review of systems performed and notable only for those listed, all others are neg:  Constitutional: Fatigue Cardiovascular: neg Ear/Nose/Throat: neg  Skin: neg Eyes: neg Respiratory: neg Gastroitestinal: neg  Hematology/Lymphatic: Easy bruising Endocrine: neg Musculoskeletal:neg Allergy/Immunology: neg Neurological: neg Psychiatric: neg Sleep : neg   ALLERGIES: Allergies  Allergen Reactions  . Codeine Nausea And Vomiting  . Demerol Nausea And Vomiting    HOME MEDICATIONS: Outpatient Prescriptions Prior to Visit  Medication Sig Dispense Refill  . amLODipine (NORVASC) 10 MG tablet Take 10 mg by mouth daily.    [name] atorvastatin (LIPITOR) 40 MG tablet Take 1 tablet (40 mg total) by mouth daily. 30 tablet 0  . dipyridamole-aspirin (AGGRENOX) 200-25 MG per 12 hr capsule TAKE ONE CAPSULE BY MOUTH TWICE A DAY 60 capsule 3  . glyBURIDE-metformin (GLUCOVANCE) 5-500 MG per tablet Take 2 tablets by mouth 2 (two) times daily with a meal.     . levothyroxine (SYNTHROID, LEVOTHROID) 200 MCG tablet Take 200 mcg by mouth daily before breakfast.     . metoprolol succinate (TOPROL-XL) 100 MG 24 hr tablet Take 100 mg by mouth daily. Take with or immediately following a meal.    . Multiple Vitamin (MULTIVITAMIN WITH MINERALS) TABS tablet Take 1 tablet by mouth daily.    [name] triamcinolone cream (KENALOG) 0.1 %  Apply 1 application topically 2 (two) times daily.   1  . Vitamin D, Ergocalciferol, (DRISDOL) 50000 UNITS CAPS capsule Take 50,000 Units by mouth every 7 (seven) days.   2  . benazepril (LOTENSIN) 40 MG tablet Take 40 mg by mouth 2 (two) times  daily.     No facility-administered medications prior to visit.    PAST MEDICAL HISTORY: Past Medical History  Diagnosis Date  . Hypertension   . Thyroid disease   . Gallstones and inflammation of gallbladder without obstruction   . Cholecystitis with cholelithiasis   . Arthritis   . Emphysema of lung   . Hyperlipidemia   . Eczema   . Umbilical hernia   . Tobacco abuse     quit 11/08/2012  . Obesity   . History of nuclear stress test 02/23/2010    dipyridamole; normal pattern of perfusion in all regions; normal, low risk study   . Meningoencephalitis   . Shortness of breath   . Heart murmur   . Pneumonia     "twice" (05/19/2013)  . OSA on CPAP     last sleep study >8 yrs  . 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 Type II diabetes mellitus   . Stroke     "they said I had a minor one when I had menigitis" (05/19/2013)    PAST SURGICAL HISTORY: Past Surgical History  Procedure Laterality Date  . Knee arthroscopy Right     "w2 times" (05/19/2013)  . Cholecystectomy  09/09/2011    Procedure: LAPAROSCOPIC CHOLECYSTECTOMY WITH INTRAOPERATIVE CHOLANGIOGRAM;  Surgeon: Cherylynn Ridges III, MD;  Location: [facility] OR;  Service: General;  Laterality: N/A;  . Transthoracic echocardiogram  03/28/2005    EF=>55%, mod conc LVH; LA mod dilated & borderline RA enlargement; mild TR; mild pulm valve regurg  . Joint replacement    . Tonsillectomy    . Hernia repair  04/2006    UHR  . 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 Total knee arthroplasty Left 05/19/2013    Procedure: TOTAL KNEE ARTHROPLASTY;  Surgeon: Nadara Mustard, MD;  Location: [facility] OR;  Service: Orthopedics;  Laterality: Left;  Left Total Knee Arthroplasty    FAMILY HISTORY: Family History  Problem Relation Age of Onset  . Cancer Maternal Aunt     colon  . Cancer Maternal Aunt     lung  . Thyroid disease Mother   . Hyperlipidemia Father   . Diabetes Maternal Grandmother   . Asthma Child   .  Diabetes type I Child     SOCIAL HISTORY: History   Social History  . Marital Status: Married    Spouse Name: sandra  . Number of Children: 3  . Years of Education: tech   Occupational History  .     Social History Main Topics  . Smoking status: Current Every Day Smoker -- 0.50 packs/day for 50 years    Types: Cigarettes  . Smokeless tobacco: Never Used  . Alcohol Use: No  . Drug Use: No  . Sexual Activity: Yes   Other Topics Concern  . Not on file   Social History Narrative     PHYSICAL EXAM  Filed Vitals:   12/26/14 1528  BP: 155/75  Pulse: 69  Height: 5\' 10"  (1.778 m)  Weight: 281 lb 12.8 oz (127.824 kg)   Body mass index is 40.43 kg/(m^2). General: obese middle aged Caucasian male seated, in no evident distress  Head: head normocephalic  and atraumatic. Orohparynx benign  Neck: supple with no carotid or supraclavicular bruits  Cardiovascular: regular rate and rhythm, no murmurs  Skin: no rash/petichiae   Neurologic Exam  Mental Status: Awake and fully alert. Oriented to place and time. Recent and remote memory intact. Attention span, concentration and fund of knowledge appropriate. Mood and affect appropriate.  Cranial Nerves: Pupils equal, briskly reactive to light. Extraocular movements full without nystagmus. Visual fields full to confrontation. Hearing intact. Facial sensation intact. Face, tongue, palate moves normally and symmetrically.  Motor: Normal bulk and tone. Normal strength in all tested extremity muscles. No focal weakness Sensory: intact to touch  Coordination: Rapid alternating movements normal in all extremities. Finger-to-nose and heel-to-shin performed accurately bilaterally.  Gait and Station: Arises from chair with mild difficulty. Stance is normal. Gait demonstrates normal stride length and balance . Tandem gait is stable  Reflexes: 1+ and symmetric.    DIAGNOSTIC DATA (LABS, IMAGING, TESTING) - I reviewed patient  records, labs, notes, testing and imaging myself where available.  Lab Results  Component Value Date   WBC 9.9 11/07/2014   HGB 14.1 11/07/2014   HCT 40.9 11/07/2014   MCV 88.5 11/07/2014   PLT 196 11/07/2014      Component Value Date/Time   NA 140 11/07/2014 1042   K 3.7 11/07/2014 1042   CL 102 11/07/2014 1042   CO2 28 11/07/2014 1042   GLUCOSE 141* 11/07/2014 1042   BUN 10 11/07/2014 1042   CREATININE 0.63 11/07/2014 1042   CALCIUM 9.1 11/07/2014 1042   PROT 6.7 11/07/2014 1042   ALBUMIN 3.8 11/07/2014 1042   AST 17 11/07/2014 1042   ALT 14* 11/07/2014 1042   ALKPHOS 78 11/07/2014 1042   BILITOT 1.0 11/07/2014 1042   GFRNONAA >60 11/07/2014 1042   GFRAA >60 11/07/2014 1042   ASSESSMENT AND PLAN  69 y.o. year old male  has a past medical history of Hypertension;  Emphysema of lung; Hyperlipidemia; ; Obesity; OSA on CPAP; Type II diabetes mellitus; and Stroke. here to follow-up. He has not had further stroke or TIA symptoms.The patient is a current patient of Dr. Pearlean Brownie  who is out of the office today . This note is sent to the work in doctor.      Recent carotid Doppler with Dr. Nadara Eaton Continue Aggrenox for secondary stroke prevention will refill Control of hypertension with blood pressure below 130/90 today's reading 155/75 Keep LDL cholesterol below 70%.  Continue to use CPAP every night 14 cm Follow-up in 6-8 months next visit with Dr. Caswell Corwin Darrol Angel, [facility], [facility], APRN  [facility] [address], [address] [place], [place] [address] [phone]

## 2015-01-02 ENCOUNTER — Ambulatory Visit: Payer: 59 | Admitting: Nurse Practitioner

## 2015-01-03 NOTE — Progress Notes (Signed)
I have reviewed and agreed above plan. 

## 2015-01-18 LAB — HM DIABETES EYE EXAM

## 2015-03-27 ENCOUNTER — Encounter: Payer: Self-pay | Admitting: Internal Medicine

## 2015-03-27 ENCOUNTER — Ambulatory Visit (INDEPENDENT_AMBULATORY_CARE_PROVIDER_SITE_OTHER): Payer: 59 | Admitting: Internal Medicine

## 2015-03-27 VITALS — BP 156/72 | HR 67 | Temp 97.7°F | Resp 12 | Ht 70.0 in | Wt 285.0 lb

## 2015-03-27 DIAGNOSIS — E890 Postprocedural hypothyroidism: Secondary | ICD-10-CM | POA: Diagnosis not present

## 2015-03-27 DIAGNOSIS — E1142 Type 2 diabetes mellitus with diabetic polyneuropathy: Secondary | ICD-10-CM | POA: Diagnosis not present

## 2015-03-27 DIAGNOSIS — I639 Cerebral infarction, unspecified: Secondary | ICD-10-CM

## 2015-03-27 DIAGNOSIS — E1165 Type 2 diabetes mellitus with hyperglycemia: Secondary | ICD-10-CM | POA: Insufficient documentation

## 2015-03-27 DIAGNOSIS — IMO0002 Reserved for concepts with insufficient information to code with codable children: Secondary | ICD-10-CM | POA: Insufficient documentation

## 2015-03-27 MED ORDER — METFORMIN HCL ER 500 MG PO TB24: 2000.0000 mg | ORAL_TABLET | Freq: Every day | ORAL | Status: DC

## 2015-03-27 MED ORDER — GLIPIZIDE ER 10 MG PO TB24: 10.0000 mg | ORAL_TABLET | Freq: Every day | ORAL | Status: DC

## 2015-03-27 NOTE — Progress Notes (Signed)
Patient ID: Mason Patton, male   DOB: [birth year], 69 y.o.   MRN: [id number]  HPI: Mason Patton is a 69 y.o.-year-old male, referred by his PCP, Nena Polio, NP, for management of DM2, dx in 2010, non-insulin-dependent, uncontrolled, with complications (retinopathy, PN, cerebrovascular disease-s/p stroke) and also hypothyroidism.  DM2: Last hemoglobin A1c was: 01/31/2015: HbA1c 8.4% 10/13/2014: HbA1c 7.8% 07/28/2014: HbA1c 7.3% 07/23/2013: HbA1c 6.6% Lab Results  Component Value Date   HGBA1C 7.2* 11/16/2013   HGBA1C 7.7* 03/30/2013   HGBA1C 7.3* 09/09/2011   Pt is on a regimen of: - Glucovance (Glyburide-Metformin) 5-500 mg x4 a day - after dinner  Pt checks his sugars "seldom" - when he feels bad - and 120-140 - am: n/c - 2h after b'fast: n/c - before lunch: n/c - 2h after lunch: n/c - before dinner: n/c - 2h after dinner: n/c - bedtime: n/c - nighttime: n/c No lows. Lowest sugar was 50s - 1 week ago - midnight (woke him); he has hypoglycemia awareness at 70.  Highest sugar was 200s.  Glucometer: ?  Pt's meals are: - Breakfast: skip or biscuit, eggs, toast, grits - Lunch: fast food - Dinner: meat + potatoes + salads; beans - Snacks: rarely; fruit   - no CKD, last BUN/creatinine:  01/31/2015: Glucose 158, BUN/creatinine 11/0.69, EGFR>60, LFTs normal Lab Results  Component Value Date   BUN 10 11/07/2014   CREATININE 0.63 11/07/2014  on Diovan. - last set of lipids: 01/31/2015: 113/110/32/74 10/13/2014: 117/116/30/78 Lab Results  Component Value Date   CHOL 97 11/16/2013   HDL 24* 11/16/2013   LDLCALC 39 11/16/2013   TRIG 171* 11/16/2013   CHOLHDL 4.0 11/16/2013  on Lipitor 40 mg daily - last eye exam was in 01/2015 [facility]). No DR.  - no numbness and tingling in his feet. + mild burning.   Pt has FH of DM in [place], son.   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 - he takes the  levothyroxine:  Fasting  In a.m. But has moved it at night several mo ago!  Separated by>30 min from breakfast  No PPI, iron, calcium, multivitamin  Reviewed TFTs: 01/31/2015: TSH 5.747 (0.4-5.5) - after starting taking it at night 10/13/2014: TSH 2.619 03/08/2014: TSH 2.134 07/23/2013: TSH 3.869 Lab Results  Component Value Date   TSH 0.105* 03/23/2013   He also has a history of hypertension, emphysema,  Hyperlipidemia, prostate hypertrophy, OSA, OA, fibromyalgia, heart murmur.  ROS: Constitutional: + weight gain, + fatigue, no subjective hyperthermia/hypothermia, + poor sleep, + 4-5x nocturia  Eyes: no blurry vision, no xerophthalmia ENT: no sore throat, no nodules palpated in throat, no dysphagia/odynophagia, no hoarseness, + hypoacusis Cardiovascular: + CP/no SOB/palpitations/leg swelling Respiratory: + all: cough/SOB/wheezing Gastrointestinal: no N/V/+D/no C Musculoskeletal: no muscle/joint aches Skin: + rash, + easy bruising Neurological: no tremors/numbness/tingling/dizziness Psychiatric: no depression/anxiety + diff with erections  Past Medical History  Diagnosis Date  . Hypertension   . Thyroid disease   . Gallstones and inflammation of gallbladder without obstruction   . Cholecystitis with cholelithiasis   . Arthritis   . Emphysema of lung   . Hyperlipidemia   . Eczema   . Umbilical hernia   . Tobacco abuse     quit 11/08/2012  . Obesity   . History of nuclear stress test 02/23/2010    dipyridamole; normal pattern of perfusion in all regions; normal, low risk study   .  Meningoencephalitis   . Shortness of breath   . Heart murmur   . Pneumonia     "twice" (05/19/2013)  . OSA on CPAP     last sleep study >8 yrs  . 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 Type II diabetes mellitus   . Stroke     "they said I had a minor one when I had menigitis" (05/19/2013)   Past Surgical History  Procedure Laterality Date  . Knee  arthroscopy Right     "w2 times" (05/19/2013)  . Cholecystectomy  09/09/2011    Procedure: LAPAROSCOPIC CHOLECYSTECTOMY WITH INTRAOPERATIVE CHOLANGIOGRAM;  Surgeon: Gwenyth Ober III, MD;  Location: [facility];  Service: General;  Laterality: N/A;  . Transthoracic echocardiogram  03/28/2005    EF=>55%, mod conc LVH; LA mod dilated & borderline RA enlargement; mild TR; mild pulm valve regurg  . Joint replacement    . Tonsillectomy    . Hernia repair  04/2006    UHR  . 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 Total knee arthroplasty Left 05/19/2013    Procedure: TOTAL KNEE ARTHROPLASTY;  Surgeon: Newt Minion, MD;  Location: [facility];  Service: Orthopedics;  Laterality: Left;  Left Total Knee Arthroplasty   Social History   Social History  . Marital Status: Married    Spouse Name: sandra  . Number of Children: 3  . Years of Education: tech   Occupational History  . Senior [employer] History Main Topics  . Smoking status: Current Every Day Smoker -- 0.50 packs/day for 50 years    Types: Cigarettes  . Smokeless tobacco: Never Used  . Alcohol Use: No  . Drug Use: No  . Sexual Activity: Yes   Other Topics Concern  . Not on file   Social History Narrative   Current Outpatient Prescriptions on File Prior to Visit  Medication Sig Dispense Refill  . amLODipine (NORVASC) 10 MG tablet Take 10 mg by mouth daily.    [name] atorvastatin (LIPITOR) 40 MG tablet Take 1 tablet (40 mg total) by mouth daily. 30 tablet 0  . dipyridamole-aspirin (AGGRENOX) 200-25 MG per 12 hr capsule Take 1 capsule by mouth 2 (two) times daily. 60 capsule 8  . glyBURIDE-metformin (GLUCOVANCE) 5-500 MG per tablet Take 2 tablets by mouth 2 (two) times daily with a meal.     . levothyroxine (SYNTHROID, LEVOTHROID) 200 MCG tablet Take 200 mcg by mouth daily before breakfast.     . metoprolol succinate (TOPROL-XL) 100 MG 24 hr tablet Take 100 mg by mouth daily. Take with or immediately following a meal.     . Multiple Vitamin (MULTIVITAMIN WITH MINERALS) TABS tablet Take 1 tablet by mouth daily.    [name] triamcinolone cream (KENALOG) 0.1 % Apply 1 application topically 2 (two) times daily.   1  . valsartan-hydrochlorothiazide (DIOVAN-HCT) 320-12.5 MG per tablet Take 1 tablet by mouth daily.  1  . benazepril (LOTENSIN) 40 MG tablet     . Vitamin D, Ergocalciferol, (DRISDOL) 50000 UNITS CAPS capsule Take 50,000 Units by mouth every 7 (seven) days.   2   No current facility-administered medications on file prior to visit.   Allergies  Allergen Reactions  . Codeine Nausea And Vomiting  . Demerol Nausea And Vomiting   Family History  Problem Relation Age of Onset  . Cancer Maternal Aunt     colon  . Cancer Maternal Aunt  lung  . Thyroid disease Mother   . Hyperlipidemia Father   . Diabetes Maternal Grandmother   . Asthma Child   . Diabetes type I Child    PE: BP 156/72 mmHg  Pulse 67  Temp(Src) 97.7 F (36.5 C) (Oral)  Resp 12  Ht '5\' 10"'  (1.778 m)  Wt 285 lb (129.275 kg)  BMI 40.89 kg/m2  SpO2 96% Wt Readings from Last 3 Encounters:  03/27/15 285 lb (129.275 kg)  12/26/14 281 lb 12.8 oz (127.824 kg)  05/31/14 287 lb 9.6 oz (130.455 kg)   Constitutional: overweight, in NAD Eyes: PERRLA, EOMI, no exophthalmos ENT: moist mucous membranes, no thyromegaly, no cervical lymphadenopathy Cardiovascular: RRR, + 2/6 SEM, no RG Respiratory: CTA B Gastrointestinal: abdomen soft, NT, ND, BS+ Musculoskeletal: no deformities, strength intact in all 4 Skin: moist, warm, no rashes Neurological: no tremor with outstretched hands, DTR normal in all 4  ASSESSMENT: 1. 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 Hypothyroidism  PLAN:  1. Patient with long-standing, uncontrolled diabetes, on oral antidiabetic regimen, which became insufficient. He is not checking sugars >> advised to start  doing so. For now, I advised him to switch from Glucovance to Glipizixe XL 10 mg in am and to start Metformin XR at dinnertime (as he has diarrhea now  - ? If from Metformin). Will make further decisions depending on sugars at next visit. - I suggested to:  Patient Instructions  Please stop Glucovance.   Start: - Metformin ER 2000 mg daily, at dinnertime - Glipizide XL 10 mg in am, before breakfast  Please return in 1.5 month with your sugar log.  - Strongly advised him to start checking sugars at different times of the day - check 2 times a day, rotating checks - given sugar log and advised how to fill it and to bring it at next appt  - given foot care handout and explained the principles  - given instructions for hypoglycemia management "15-15 rule"  - advised for yearly eye exams >> he is UTD - Oferred to refer him to nutrition >> refused - Return to clinic in 1.5 mo with sugar log   2. Hypothyroidism - postablative - was doing well until he moved the LT4 at night >> TSH increased >> dose of LT4 increased - will advise him to decrease the dose back to 200 mcg daily and take it every day, with water, >30 minutes before breakfast, separated by >4 hours from acid reflux medications, calcium, iron, multivitamins. - will recheck TFTs at next visit  - time spent with the patient: 1 hour, of which >50% was spent in obtaining information about his symptoms, reviewing his previous labs, evaluations, and treatments, counseling him about his conditions (please see the discussed topics above), and developing a plan to further investigate and treat them; he had a number of questions which I addressed.

## 2015-03-27 NOTE — Patient Instructions (Addendum)
Please stop Glucovance.   Start: - Metformin ER 2000 mg daily, at dinnertime - Glipizide XL 10 mg in am, before breakfast  Please decrease the Levothyroxine dose to 200 mcg daily and move it to am.  Take the thyroid hormone every day, with water, >30 minutes before breakfast, separated by >4 hours from acid reflux medications, calcium, iron, multivitamins.  Please return in 1.5 month with your sugar log.   PATIENT INSTRUCTIONS FOR TYPE 2 DIABETES:  **Please join MyChart!** - see attached instructions about how to join if you have not done so already.  DIET AND EXERCISE Diet and exercise is an important part of diabetic treatment.  We recommended aerobic exercise in the form of brisk walking (working between 40-60% of maximal aerobic capacity, similar to brisk walking) for 150 minutes per week (such as 30 minutes five days per week) along with 3 times per week performing 'resistance' training (using various gauge rubber tubes with handles) 5-10 exercises involving the major muscle groups (upper body, lower body and core) performing 10-15 repetitions (or near fatigue) each exercise. Start at half the above goal but build slowly to reach the above goals. If limited by weight, joint pain, or disability, we recommend daily walking in a swimming pool with water up to waist to reduce pressure from joints while allow for adequate exercise.    BLOOD GLUCOSES Monitoring your blood glucoses is important for continued management of your diabetes. Please check your blood glucoses 2-4 times a day: fasting, before meals and at bedtime (you can rotate these measurements - e.g. one day check before the 3 meals, the next day check before 2 of the meals and before bedtime, etc.).   HYPOGLYCEMIA (low blood sugar) Hypoglycemia is usually a reaction to not eating, exercising, or taking too much insulin/ other diabetes drugs.  Symptoms include tremors, sweating, hunger, confusion, headache, etc. Treat  IMMEDIATELY with 15 grams of Carbs: . 4 glucose tablets .  cup regular juice/soda . 2 tablespoons raisins . 4 teaspoons sugar . 1 tablespoon honey Recheck blood glucose in 15 mins and repeat above if still symptomatic/blood glucose <100.  RECOMMENDATIONS TO REDUCE YOUR RISK OF DIABETIC COMPLICATIONS: * Take your prescribed MEDICATION(S) * Follow a DIABETIC diet: Complex carbs, fiber rich foods, (monounsaturated and polyunsaturated) fats * AVOID saturated/trans fats, high fat foods, >2,300 mg salt per day. * EXERCISE at least 5 times a week for 30 minutes or preferably daily.  * DO NOT SMOKE OR DRINK more than 1 drink a day. * Check your FEET every day. Do not wear tightfitting shoes. Contact us if you develop an ulcer * See your EYE doctor once a year or more if needed * Get a FLU shot once a year * Get a PNEUMONIA vaccine once before and once after age 84 years  GOALS:  * Your Hemoglobin A1c of <7%  * fasting sugars need to be <130 * after meals sugars need to be <180 (2h after you start eating) * Your Systolic BP should be 140 or lower  * Your Diastolic BP should be 80 or lower  * Your HDL (Good Cholesterol) should be 40 or higher  * Your LDL (Bad Cholesterol) should be 100 or lower. * Your Triglycerides should be 150 or lower  * Your Urine microalbumin (kidney function) should be <30 * Your Body Mass Index should be 25 or lower    Please consider the following ways to cut down carbs and fat and increase fiber and micronutrients  in your diet: - substitute whole grain for white bread or pasta - substitute brown rice for white rice - substitute 90-calorie flat bread pieces for slices of bread when possible - substitute sweet potatoes or yams for white potatoes - substitute humus for margarine - substitute tofu for cheese when possible - substitute almond or rice milk for regular milk (would not drink soy milk daily due to concern for soy estrogen influence on breast cancer  risk) - substitute dark chocolate for other sweets when possible - substitute water - can add lemon or orange slices for taste - for diet sodas (artificial sweeteners will trick your body that you can eat sweets without getting calories and will lead you to overeating and weight gain in the long run) - do not skip breakfast or other meals (this will slow down the metabolism and will result in more weight gain over time)  - can try smoothies made from fruit and almond/rice milk in am instead of regular breakfast - can also try old-fashioned (not instant) oatmeal made with almond/rice milk in am - order the dressing on the side when eating salad at a restaurant (pour less than half of the dressing on the salad) - eat as little meat as possible - can try juicing, but should not forget that juicing will get rid of the fiber, so would alternate with eating raw veg./fruits or drinking smoothies - use as little oil as possible, even when using olive oil - can dress a salad with a mix of balsamic vinegar and lemon juice, for e.g. - use agave nectar, stevia sugar, or regular sugar rather than artificial sweateners - steam or broil/roast veggies  - snack on veggies/fruit/nuts (unsalted, preferably) when possible, rather than processed foods - reduce or eliminate aspartame in diet (it is in diet sodas, chewing gum, etc) Read the labels!  Try to read Dr. Katherina Right book: "Program for Reversing Diabetes" for other ideas for healthy eating.

## 2015-03-30 ENCOUNTER — Telehealth: Payer: Self-pay | Admitting: Internal Medicine

## 2015-03-30 MED ORDER — ONETOUCH VERIO FLEX SYSTEM W/DEVICE KIT: 1.0000 | PACK | Freq: Every day | Status: DC

## 2015-03-30 MED ORDER — GLUCOSE BLOOD VI STRP: ORAL_STRIP | Status: DC

## 2015-03-30 MED ORDER — ONETOUCH DELICA LANCETS FINE MISC: Status: DC

## 2015-03-30 NOTE — Telephone Encounter (Signed)
Patient stated that he lost his meter,he need a new one and a prescription for test strips.

## 2015-03-30 NOTE — Telephone Encounter (Signed)
New meter, strips and lancets sent to pt's pharmacy.

## 2015-03-31 ENCOUNTER — Encounter: Payer: Self-pay | Admitting: Internal Medicine

## 2015-05-09 ENCOUNTER — Encounter: Payer: Self-pay | Admitting: Internal Medicine

## 2015-05-09 ENCOUNTER — Ambulatory Visit (INDEPENDENT_AMBULATORY_CARE_PROVIDER_SITE_OTHER): Payer: Medicare Other | Admitting: Internal Medicine

## 2015-05-09 VITALS — BP 118/64 | HR 57 | Temp 97.7°F | Resp 12 | Wt 279.8 lb

## 2015-05-09 DIAGNOSIS — E890 Postprocedural hypothyroidism: Secondary | ICD-10-CM

## 2015-05-09 DIAGNOSIS — E1165 Type 2 diabetes mellitus with hyperglycemia: Secondary | ICD-10-CM | POA: Diagnosis not present

## 2015-05-09 DIAGNOSIS — I639 Cerebral infarction, unspecified: Secondary | ICD-10-CM

## 2015-05-09 DIAGNOSIS — IMO0002 Reserved for concepts with insufficient information to code with codable children: Secondary | ICD-10-CM

## 2015-05-09 DIAGNOSIS — E1142 Type 2 diabetes mellitus with diabetic polyneuropathy: Secondary | ICD-10-CM | POA: Diagnosis not present

## 2015-05-09 MED ORDER — GLUCOSE BLOOD VI STRP: ORAL_STRIP | Status: DC

## 2015-05-09 MED ORDER — SITAGLIPTIN PHOSPHATE 100 MG PO TABS: 100.0000 mg | ORAL_TABLET | Freq: Every day | ORAL | Status: DC

## 2015-05-09 NOTE — Patient Instructions (Signed)
Please continue:  - Metformin ER 2000 mg daily, at dinnertime - Glipizide XL 10 mg in am, before breakfast  Please add Januvia 100 mg in am.  Please continue Levothyroxine dose 200 mcg daily in am.  Take the thyroid hormone every day, with water, >30 minutes before breakfast, separated by >4 hours from acid reflux medications, calcium, iron, multivitamins.  Please return in 3 months with your sugar log.

## 2015-05-09 NOTE — Progress Notes (Signed)
Patient ID: Mason Patton, male   DOB: 12-09-1945, 69 y.o.   MRN: [id number]  HPI: Mason Patton is a 69 y.o.-year-old male, referred by his PCP, Mason Polio, NP, for management of DM2, dx in 2010, non-insulin-dependent, uncontrolled, with complications (retinopathy, PN, cerebrovascular disease-s/p stroke) and also hypothyroidism.  DM2: Last hemoglobin A1c was: 04/25/2015: HbA1c 8.2% 01/31/2015: HbA1c 8.4% 10/13/2014: HbA1c 7.8% 07/28/2014: HbA1c 7.3% 07/23/2013: HbA1c 6.6% Lab Results  Component Value Date   HGBA1C 7.2* 11/16/2013   HGBA1C 7.7* 03/30/2013   HGBA1C 7.3* 09/09/2011   Pt was on a regimen of: - Glucovance (Glyburide-Metformin) 5-500 mg x4 a day - after dinner  Now he is on: - Metformin ER 2000 mg daily, at dinnertime - Glipizide XL 10 mg in am, before breakfast  Pt checks his sugars 1-2x a day: - am: n/c >> 116-165, 180, 217 - 2h after b'fast: n/c - before lunch: n/c - 2h after lunch: n/c >> 139-180 - before dinner: n/c >> 121-201 - 2h after dinner: n/c >> 117-165 - bedtime: n/c - nighttime: n/c No lows. Lowest sugar was 50s - 116; he has hypoglycemia awareness at 70.  Highest sugar was 200s.  Glucometer: ?  Pt's meals are: - Breakfast: skip or biscuit, eggs, toast, grits - Lunch: fast food - Dinner: meat + potatoes + salads; beans - Snacks: rarely; fruit   - no CKD, last BUN/creatinine:  04/25/2015: BUN/Cr 9/0.7 01/31/2015: Glucose 158, BUN/creatinine 11/0.69, EGFR>60, LFTs normal Lab Results  Component Value Date   BUN 10 11/07/2014   CREATININE 0.63 11/07/2014  ACR 75 On Diovan. - last set of lipids: 04/25/2015: 87/125/27/35 01/31/2015: 113/110/32/74 10/13/2014: 117/116/30/78 Lab Results  Component Value Date   CHOL 97 11/16/2013   HDL 24* 11/16/2013   LDLCALC 39 11/16/2013   TRIG 171* 11/16/2013   CHOLHDL 4.0 11/16/2013  on Lipitor 40 mg daily - last eye exam was in 01/2015 [facility]). No DR.  - no numbness and tingling in his  feet. + mild burning.   Pt has FH of DM in [place], son.   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 - he takes the levothyroxine:  Fasting  In a.m. now  Separated by>30 min from breakfast  No PPI, iron, calcium, multivitamin  Reviewed TFTs: 04/25/2015: TSH 2.48 01/31/2015: TSH 5.747 (0.4-5.5) - after starting taking it at night 10/13/2014: TSH 2.619 03/08/2014: TSH 2.134 07/23/2013: TSH 3.869 Lab Results  Component Value Date   TSH 0.105* 03/23/2013   He also has a history of hypertension, emphysema,  Hyperlipidemia, prostate hypertrophy, OSA, OA, fibromyalgia, heart murmur.  ROS: Constitutional: no weight gain, + fatigue, no subjective hyperthermia/hypothermia, + poor sleep, +  nocturia  Eyes: no blurry vision, no xerophthalmia ENT: no sore throat, no nodules palpated in throat, no dysphagia/odynophagia, no hoarseness, + hypoacusis Cardiovascular: + CP/no SOB/palpitations/leg swelling Respiratory: no cough/SOB/wheezing Gastrointestinal: no N/V/D/C Musculoskeletal: no muscle/joint aches Skin: no rash, + easy bruising Neurological: no tremors/numbness/tingling/dizziness  I reviewed pt's medications, allergies, PMH, social hx, family hx, and changes were documented in the history of present illness. Otherwise, unchanged from my initial visit note.  Past Medical History  Diagnosis Date  . Hypertension   . Thyroid disease   . Gallstones and inflammation of gallbladder without obstruction   . Cholecystitis with cholelithiasis   . Arthritis   . Emphysema of lung ([place])   . Hyperlipidemia   .  Eczema   . Umbilical hernia   . Tobacco abuse     quit 11/08/2012  . Obesity   . History of nuclear stress test 02/23/2010    dipyridamole; normal pattern of perfusion in all regions; normal, low risk study   . Meningoencephalitis   . Shortness of breath   . Heart murmur   . Pneumonia     "twice" (05/19/2013)   . OSA on CPAP     last sleep study >8 yrs  . 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 Type II diabetes mellitus ([place])   . Stroke [facility])     "they said I had a minor one when I had menigitis" (05/19/2013)   Past Surgical History  Procedure Laterality Date  . Knee arthroscopy Right     "w2 times" (05/19/2013)  . Cholecystectomy  09/09/2011    Procedure: LAPAROSCOPIC CHOLECYSTECTOMY WITH INTRAOPERATIVE CHOLANGIOGRAM;  Surgeon: Gwenyth Ober III, MD;  Location: [place];  Service: General;  Laterality: N/A;  . Transthoracic echocardiogram  03/28/2005    EF=>55%, mod conc LVH; LA mod dilated & borderline RA enlargement; mild TR; mild pulm valve regurg  . Joint replacement    . Tonsillectomy    . Hernia repair  04/2006    UHR  . 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 Total knee arthroplasty Left 05/19/2013    Procedure: TOTAL KNEE ARTHROPLASTY;  Surgeon: Newt Minion, MD;  Location: [place];  Service: Orthopedics;  Laterality: Left;  Left Total Knee Arthroplasty   Social History   Social History  . Marital Status: Married    Spouse Name: Mason Patton  . Number of Children: 3  . Years of Education: tech   Occupational History  . [employer] [place] History Main Topics  . Smoking status: Current Every Day Smoker -- 0.50 packs/day for 50 years    Types: Cigarettes  . Smokeless tobacco: Never Used  . Alcohol Use: No  . Drug Use: No  . Sexual Activity: Yes   Other Topics Concern  . Not on file   Social History Narrative   Current Outpatient Prescriptions on File Prior to Visit  Medication Sig Dispense Refill  . amLODipine (NORVASC) 10 MG tablet Take 10 mg by mouth daily.    [name] atorvastatin (LIPITOR) 40 MG tablet Take 1 tablet (40 mg total) by mouth daily. 30 tablet 0  . Blood Glucose Monitoring Suppl (ONETOUCH VERIO FLEX SYSTEM) W/DEVICE KIT 1 each by Does not apply route daily. Dx: E11.9 1 kit 0  . Cyanocobalamin (VITAMIN B-12) 2000  MCG TBCR Take 1 tablet by mouth daily.    [name] dipyridamole-aspirin (AGGRENOX) 200-25 MG per 12 hr capsule Take 1 capsule by mouth 2 (two) times daily. 60 capsule 8  . glipiZIDE (GLUCOTROL XL) 10 MG 24 hr tablet Take 1 tablet (10 mg total) by mouth daily with breakfast. 30 tablet 2  . glucose blood (ONETOUCH VERIO) test strip Use to test blood sugar 2 times daily as instructed. Dx: E11.9 75 each 5  . levothyroxine (SYNTHROID, LEVOTHROID) 200 MCG tablet Take 200 mcg by mouth daily before breakfast.     . metFORMIN (GLUCOPHAGE-XR) 500 MG 24 hr tablet Take 4 tablets (2,000 mg total) by mouth daily with breakfast. 120 tablet 2  . metoprolol succinate (TOPROL-XL) 100 MG 24 hr tablet Take 100 mg by mouth daily. Take with or immediately following a meal.    . Multiple  Vitamin (MULTIVITAMIN WITH MINERALS) TABS tablet Take 1 tablet by mouth daily.    [name] DELICA LANCETS FINE MISC Use to test blood sugar 2 times daily as instructed. Dx: E11.9 100 each 4  . triamcinolone cream (KENALOG) 0.1 % Apply 1 application topically 2 (two) times daily.   1  . valsartan-hydrochlorothiazide (DIOVAN-HCT) 320-12.5 MG per tablet Take 1 tablet by mouth daily.  1  . Vitamin D, Ergocalciferol, (DRISDOL) 50000 UNITS CAPS capsule Take 50,000 Units by mouth every 7 (seven) days.   2   No current facility-administered medications on file prior to visit.   Allergies  Allergen Reactions  . Codeine Nausea And Vomiting  . Demerol Nausea And Vomiting   Family History  Problem Relation Age of Onset  . Cancer Maternal Aunt     colon  . Cancer Maternal Aunt     lung  . Thyroid disease Mother   . Hyperlipidemia Father   . Diabetes Maternal Grandmother   . Asthma Child   . Diabetes type I Child    PE: BP 118/64 mmHg  Pulse 57  Temp(Src) 97.7 F (36.5 C) (Oral)  Resp 12  Wt 279 lb 12.8 oz (126.916 kg)  SpO2 97% Wt Readings from Last 3 Encounters:  05/09/15 279 lb 12.8 oz (126.916 kg)  03/27/15 285 lb (129.275 kg)   12/26/14 281 lb 12.8 oz (127.824 kg)   Constitutional: overweight, in NAD Eyes: PERRLA, EOMI, no exophthalmos ENT: moist mucous membranes, no thyromegaly, no cervical lymphadenopathy Cardiovascular: RRR, + 2/6 SEM, no RG Respiratory: CTA B Gastrointestinal: abdomen soft, NT, ND, BS+ Musculoskeletal: no deformities, strength intact in all 4 Skin: moist, warm, no rashes Neurological: no tremor with outstretched hands, DTR normal in all 4  ASSESSMENT: 1. 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 Hypothyroidism  PLAN:  1. Patient with long-standing, uncontrolled diabetes, on oral antidiabetic regimen, with insufficient control. At last visit, we switched from Glucovance to Glipizixe XL 10 mg in am and to started Metformin XR at dinnertime. Diarrhea resolved. However, sugars are still not at goal >> will add a DPP4 inh.  - I suggested to:  Patient Instructions  Please continue:  - Metformin ER 2000 mg daily, at dinnertime - Glipizide XL 10 mg in am, before breakfast  Please add Januvia 100 mg in am.  Please continue Levothyroxine dose 200 mcg daily in am.  Take the thyroid hormone every day, with water, >30 minutes before breakfast, separated by >4 hours from acid reflux medications, calcium, iron, multivitamins.  Please return in 3 months with your sugar log.  - continue checking sugars at different times of the day - check 2 times a day, rotating checks - advised for yearly eye exams >> he is UTD - had flu shot and PNA shot this season (at [place]) - Return to clinic in 3 mo with sugar log   2. Hypothyroidism - postablative - TSH normal after moving the LT4 in am and decreasing the dose to 200 mcg daily >> recent labs from [place]: TSH 2.4 - continue LT4  200 mcg daily and take it every day, with water, >30 minutes before breakfast, separated by >4 hours from acid reflux medications,  calcium, iron, multivitamins.

## 2015-05-31 ENCOUNTER — Other Ambulatory Visit: Payer: Self-pay | Admitting: Internal Medicine

## 2015-05-31 ENCOUNTER — Encounter: Payer: Self-pay | Admitting: Internal Medicine

## 2015-05-31 ENCOUNTER — Telehealth: Payer: Self-pay | Admitting: Internal Medicine

## 2015-05-31 DIAGNOSIS — E890 Postprocedural hypothyroidism: Secondary | ICD-10-CM

## 2015-05-31 DIAGNOSIS — E039 Hypothyroidism, unspecified: Secondary | ICD-10-CM

## 2015-05-31 MED ORDER — LEVOTHYROXINE SODIUM 175 MCG PO TABS: 175.0000 ug | ORAL_TABLET | Freq: Every day | ORAL | Status: DC

## 2015-05-31 NOTE — Progress Notes (Signed)
Called pt and advised him per Dr Charlean Sanfilippo message. Pt voiced understanding and will call back to schedule lab appt.

## 2015-05-31 NOTE — Progress Notes (Signed)
Received labs from PCP at [facility] from 05/30/2015: - CBC normal - Lipids: 86/95/24/43 - CMP normal, with exception of glucose 157. BUN/creatinine 13/0.6. LFTs normal. - TSH low, at 0.29 (0.45-4.5) I will advise the patient to decrease his levothyroxine dose from 200 g to 175 g. Dose was sent to the pharmacy and we will repeat his thyroid tests in 5-6 weeks.

## 2015-05-31 NOTE — Telephone Encounter (Signed)
Answered pt's question

## 2015-05-31 NOTE — Telephone Encounter (Signed)
Transferred to [place].

## 2015-06-26 ENCOUNTER — Other Ambulatory Visit: Payer: Self-pay | Admitting: Internal Medicine

## 2015-07-14 ENCOUNTER — Encounter: Payer: Self-pay | Admitting: Internal Medicine

## 2015-07-14 ENCOUNTER — Ambulatory Visit (INDEPENDENT_AMBULATORY_CARE_PROVIDER_SITE_OTHER): Payer: Medicare Other | Admitting: Internal Medicine

## 2015-07-14 VITALS — BP 114/62 | HR 61 | Temp 97.4°F | Resp 12 | Wt 265.0 lb

## 2015-07-14 DIAGNOSIS — E890 Postprocedural hypothyroidism: Secondary | ICD-10-CM

## 2015-07-14 DIAGNOSIS — IMO0002 Reserved for concepts with insufficient information to code with codable children: Secondary | ICD-10-CM

## 2015-07-14 DIAGNOSIS — E1142 Type 2 diabetes mellitus with diabetic polyneuropathy: Secondary | ICD-10-CM

## 2015-07-14 DIAGNOSIS — E1165 Type 2 diabetes mellitus with hyperglycemia: Secondary | ICD-10-CM

## 2015-07-14 LAB — TSH: TSH: 18.09 u[IU]/mL — ABNORMAL HIGH (ref 0.35–4.50)

## 2015-07-14 LAB — T4, FREE: FREE T4: 0.85 ng/dL (ref 0.60–1.60)

## 2015-07-14 LAB — HEMOGLOBIN A1C: Hgb A1c MFr Bld: 8.1 % — ABNORMAL HIGH (ref 4.6–6.5)

## 2015-07-14 NOTE — Patient Instructions (Signed)
Please continue: - Metformin ER 2000 mg daily, at dinnertime - Glipizide XL 10 mg in am, before breakfast - Januvia 100 mg in am.  Please continue Levothyroxine dose 175 mcg daily in am.  Take the thyroid hormone every day, with water, >30 minutes before breakfast, separated by >4 hours from acid reflux medications, calcium, iron, multivitamins.  Please return in 3 months with your sugar log.

## 2015-07-14 NOTE — Progress Notes (Signed)
Patient ID: Mason Patton, male   DOB: 03-12-1946, 70 y.o.   MRN: [id number]  HPI: Mason Patton is a 70 y.o.-year-old male, referred by his PCP, Nena Polio, NP, for management of DM2, dx in 2010, non-insulin-dependent, uncontrolled, with complications (retinopathy, PN, cerebrovascular disease-s/p stroke) and also hypothyroidism. Last visit 2 mo ago. Has [facility] also.  He had a URI last week >> steroids.  He lost 20 lbs since 4 mo ago.  DM2: Last hemoglobin A1c was: 04/25/2015: HbA1c 8.2% 01/31/2015: HbA1c 8.4% 10/13/2014: HbA1c 7.8% 07/28/2014: HbA1c 7.3% 07/23/2013: HbA1c 6.6% Lab Results  Component Value Date   HGBA1C 7.2* 11/16/2013   HGBA1C 7.7* 03/30/2013   HGBA1C 7.3* 09/09/2011   Pt was on a regimen of: - Glucovance (Glyburide-Metformin) 5-500 mg x4 a day - after dinner  Now he is on: - Metformin ER 2000 mg daily, at dinnertime - Glipizide XL 10 mg in am, before breakfast - Januvia 100 mg daily  Pt checks his sugars 1-2x a day: higher end of 11-beginning of 06/2015: - am: n/c >> 116-165, 180, 217 >> 137-180 - 2h after b'fast: n/c >> 106-123 - before lunch: n/c >> 64-157, 172 - 2h after lunch: n/c >> 139-180 >> 139-150 - before dinner: n/c >> 121-201 >> 66-157, 203 - 2h after dinner: n/c >> 117-165 >> 93 - bedtime: n/c >> 135 - nighttime: n/c No lows. Lowest sugar was 50s - 116 >> 64; he has hypoglycemia awareness at 70.  Highest sugar was 200s.  Glucometer: One Touch Verio  Pt's meals are: - Breakfast: skip or biscuit, eggs, toast, grits - Lunch: fast food - Dinner: meat + potatoes + salads; beans - Snacks: rarely; fruit   - no CKD, last BUN/creatinine:  04/25/2015: BUN/Cr 9/0.7 01/31/2015: Glucose 158, BUN/creatinine 11/0.69, EGFR>60, LFTs normal Lab Results  Component Value Date   BUN 10 11/07/2014   CREATININE 0.63 11/07/2014  ACR 75 On [place]. - last set of lipids: 04/25/2015: 87/125/27/35 01/31/2015: 113/110/32/74 10/13/2014:  117/116/30/78 Lab Results  Component Value Date   CHOL 97 11/16/2013   HDL 24* 11/16/2013   LDLCALC 39 11/16/2013   TRIG 171* 11/16/2013   CHOLHDL 4.0 11/16/2013  on Lipitor 40 mg daily - last eye exam was in 01/2015 [facility]). No DR.  - no numbness and tingling in his feet. + mild burning.   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 - he takes the levothyroxine:  Fasting  In a.m. now  Separated by>30 min from breakfast  No PPI, iron, calcium, multivitamin  Reviewed TFTs: Lab Results  Component Value Date   TSH 0.105* 03/23/2013  04/25/2015: TSH 2.48 01/31/2015: TSH 5.747 (0.4-5.5) - after starting taking it at night 10/13/2014: TSH 2.619 03/08/2014: TSH 2.134 07/23/2013: TSH 3.869  He also has a history of hypertension, emphysema,  Hyperlipidemia, prostate hypertrophy, OSA, OA, fibromyalgia, heart murmur.  ROS: Constitutional: no weight gain/loss, no fatigue, no subjective hyperthermia/hypothermia Eyes: no blurry vision, no xerophthalmia ENT: no sore throat, no nodules palpated in throat, no dysphagia/odynophagia, no hoarseness Cardiovascular: no CP/SOB/palpitations/leg swelling Respiratory: + all: cough/SOB/wheezing Gastrointestinal: no N/V/D/C Musculoskeletal: + muscle/no joint aches Skin: no rashes Neurological: no tremors/numbness/tingling/dizziness  I reviewed pt's medications, allergies, PMH, social hx, family hx, and changes were documented in the history of present illness. Otherwise, unchanged from my initial visit note.  Past Medical History  Diagnosis Date  . Hypertension   . Thyroid  disease   . Gallstones and inflammation of gallbladder without obstruction   . Cholecystitis with cholelithiasis   . Arthritis   . Emphysema of lung ([place])   . Hyperlipidemia   . Eczema   . Umbilical hernia   . Tobacco abuse     quit 11/08/2012  . Obesity   . History of nuclear stress test 02/23/2010     dipyridamole; normal pattern of perfusion in all regions; normal, low risk study   . Meningoencephalitis   . Shortness of breath   . Heart murmur   . Pneumonia     "twice" (05/19/2013)  . OSA on CPAP     last sleep study >8 yrs  . 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 Type II diabetes mellitus ([place])   . Stroke [facility])     "they said I had a minor one when I had menigitis" (05/19/2013)   Past Surgical History  Procedure Laterality Date  . Knee arthroscopy Right     "w2 times" (05/19/2013)  . Cholecystectomy  09/09/2011    Procedure: LAPAROSCOPIC CHOLECYSTECTOMY WITH INTRAOPERATIVE CHOLANGIOGRAM;  Surgeon: Gwenyth Ober III, MD;  Location: [place];  Service: General;  Laterality: N/A;  . Transthoracic echocardiogram  03/28/2005    EF=>55%, mod conc LVH; LA mod dilated & borderline RA enlargement; mild TR; mild pulm valve regurg  . Joint replacement    . Tonsillectomy    . Hernia repair  04/2006    UHR  . 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 Total knee arthroplasty Left 05/19/2013    Procedure: TOTAL KNEE ARTHROPLASTY;  Surgeon: Newt Minion, MD;  Location: [place];  Service: Orthopedics;  Laterality: Left;  Left Total Knee Arthroplasty   Social History   Social History  . Marital Status: Married    Spouse Name: sandra  . Number of Children: 3  . Years of Education: tech   Occupational History  . [employer] [place] History Main Topics  . Smoking status: Current Every Day Smoker -- 0.50 packs/day for 50 years    Types: Cigarettes  . Smokeless tobacco: Never Used  . Alcohol Use: No  . Drug Use: No  . Sexual Activity: Yes   Other Topics Concern  . Not on file   Social History Narrative   Current Outpatient Prescriptions on File Prior to Visit  Medication Sig Dispense Refill  . amLODipine (NORVASC) 10 MG tablet Take 10 mg by mouth daily.    [name] atorvastatin (LIPITOR) 40 MG tablet Take 1 tablet (40 mg total) by mouth  daily. 30 tablet 0  . Blood Glucose Monitoring Suppl (ONETOUCH VERIO FLEX SYSTEM) W/DEVICE KIT 1 each by Does not apply route daily. Dx: E11.9 1 kit 0  . Cyanocobalamin (VITAMIN B-12) 2000 MCG TBCR Take 1 tablet by mouth daily.    [name] dipyridamole-aspirin (AGGRENOX) 200-25 MG per 12 hr capsule Take 1 capsule by mouth 2 (two) times daily. 60 capsule 8  . glipiZIDE (GLUCOTROL XL) 10 MG 24 hr tablet Take 1 tablet (10 mg total) by mouth daily with breakfast. 30 tablet 2  . glucose blood (ONETOUCH VERIO) test strip Use to test blood sugar 2-3 times daily as instructed. Dx: E11.9 75 each 5  . levothyroxine (LEVOTHROID) 175 MCG tablet Take 1 tablet (175 mcg total) by mouth daily before breakfast. 45 tablet 1  . metFORMIN (GLUCOPHAGE-XR) 500 MG 24 hr tablet TAKE 4 TABLETS (2,000 MG  TOTAL) BY MOUTH DAILY WITH BREAKFAST. 120 tablet 1  . metoprolol succinate (TOPROL-XL) 100 MG 24 hr tablet Take 100 mg by mouth daily. Take with or immediately following a meal.    . Multiple Vitamin (MULTIVITAMIN WITH MINERALS) TABS tablet Take 1 tablet by mouth daily.    [name] DELICA LANCETS FINE MISC Use to test blood sugar 2 times daily as instructed. Dx: E11.9 100 each 4  . sitaGLIPtin (JANUVIA) 100 MG tablet Take 1 tablet (100 mg total) by mouth daily. 90 tablet 1  . triamcinolone cream (KENALOG) 0.1 % Apply 1 application topically 2 (two) times daily.   1  . valsartan-hydrochlorothiazide (DIOVAN-HCT) 320-12.5 MG per tablet Take 1 tablet by mouth daily.  1  . Vitamin D, Ergocalciferol, (DRISDOL) 50000 UNITS CAPS capsule Take 50,000 Units by mouth every 7 (seven) days.   2   No current facility-administered medications on file prior to visit.   Allergies  Allergen Reactions  . Codeine Nausea And Vomiting  . Demerol Nausea And Vomiting   Family History  Problem Relation Age of Onset  . Cancer Maternal Aunt     colon  . Cancer Maternal Aunt     lung  . Thyroid disease Mother   . Hyperlipidemia Father   .  Diabetes Maternal Grandmother   . Asthma Child   . Diabetes type I Child    PE: BP 114/62 mmHg  Pulse 61  Temp(Src) 97.4 F (36.3 C) (Oral)  Resp 12  Wt 265 lb (120.203 kg)  SpO2 97% Body mass index is 38.02 kg/(m^2). Wt Readings from Last 3 Encounters:  07/14/15 265 lb (120.203 kg)  05/09/15 279 lb 12.8 oz (126.916 kg)  03/27/15 285 lb (129.275 kg)   Constitutional: overweight, in NAD Eyes: PERRLA, EOMI, no exophthalmos ENT: moist mucous membranes, no thyromegaly, no cervical lymphadenopathy Cardiovascular: RRR, + 2/6 SEM, no RG Respiratory: CTA B Gastrointestinal: abdomen soft, NT, ND, BS+ Musculoskeletal: no deformities, strength intact in all 4 Skin: moist, warm, no rashes Neurological: no tremor with outstretched hands, DTR normal in all 4  ASSESSMENT: 1. 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 Hypothyroidism  PLAN:  1. Patient with long-standing, uncontrolled diabetes, on oral antidiabetic regimen, with insufficient control. At last visit, we added Januvia >> sugars a little better but still variable.  - I suggested to:  Patient Instructions  Please continue: - Metformin ER 2000 mg daily, at dinnertime - Glipizide XL 10 mg in am, before breakfast - Januvia 100 mg in am.  Please return in 3 months with your sugar log.  - continue checking sugars at different times of the day - check 2 times a day, rotating checks - advised for yearly eye exams >> he is UTD - had flu shot and PNA shot this season (at [place]) - Return to clinic in 3 mo with sugar log   2. Hypothyroidism - postablative - TSH low at last visit >> decreased the dose to 175 mcg daily  - continue LT4  175 mcg daily and take it every day, with water, >30 minutes before breakfast, separated by >4 hours from acid reflux medications, calcium, iron, multivitamins. - will check TFTs today  Patient  Instructions  Please continue Levothyroxine dose 175 mcg daily in am.  Take the thyroid hormone every day, with water, >30 minutes before breakfast, separated by >4 hours from acid reflux medications, calcium, iron, multivitamins.  Office  Visit on 07/14/2015  Component Date Value Ref Range Status  . Free T4 07/14/2015 0.85  0.60 - 1.60 ng/dL Final  . TSH 07/14/2015 18.09* 0.35 - 4.50 uIU/mL Final  . Hgb A1c MFr Bld 07/14/2015 8.1* 4.6 - 6.5 % Final   Glycemic Control Guidelines for People with Diabetes:Non Diabetic:  <6%Goal of Therapy: <7%Additional Action Suggested:  >8%    HbA1c higher likely 2/2 the high sugars in 11-06/2015. Will repeat at next visit. TSH very high, in contrast to the last TSH, which was suppressed on the higher TSH dose. I would like to enforce compliance, repeat his TSH in 5-6 weeks, and if still high, increase the dose back.

## 2015-07-21 ENCOUNTER — Other Ambulatory Visit: Payer: Self-pay | Admitting: Internal Medicine

## 2015-07-26 ENCOUNTER — Encounter: Payer: Self-pay | Admitting: Nurse Practitioner

## 2015-07-26 ENCOUNTER — Ambulatory Visit (INDEPENDENT_AMBULATORY_CARE_PROVIDER_SITE_OTHER): Payer: 59 | Admitting: Nurse Practitioner

## 2015-07-26 VITALS — BP 136/70 | HR 58 | Ht 70.0 in | Wt 275.6 lb

## 2015-07-26 DIAGNOSIS — I6789 Other cerebrovascular disease: Secondary | ICD-10-CM

## 2015-07-26 DIAGNOSIS — I10 Essential (primary) hypertension: Secondary | ICD-10-CM | POA: Diagnosis not present

## 2015-07-26 DIAGNOSIS — I6381 Other cerebral infarction due to occlusion or stenosis of small artery: Secondary | ICD-10-CM

## 2015-07-26 DIAGNOSIS — I639 Cerebral infarction, unspecified: Secondary | ICD-10-CM | POA: Diagnosis not present

## 2015-07-26 DIAGNOSIS — G4730 Sleep apnea, unspecified: Secondary | ICD-10-CM | POA: Diagnosis not present

## 2015-07-26 DIAGNOSIS — Z9989 Dependence on other enabling machines and devices: Secondary | ICD-10-CM

## 2015-07-26 DIAGNOSIS — G4733 Obstructive sleep apnea (adult) (pediatric): Secondary | ICD-10-CM | POA: Diagnosis not present

## 2015-07-26 MED ORDER — ASPIRIN-DIPYRIDAMOLE ER 25-200 MG PO CP12: 1.0000 | ORAL_CAPSULE | Freq: Two times a day (BID) | ORAL | Status: DC

## 2015-07-26 NOTE — Patient Instructions (Addendum)
Continue CPAP at 14cm  every night Keep systolic blood pressure less than 130, today's reading 136/70 Lipids are followed by PCP last LDL 74 keep less than 70 Carotid Doppler to be followed by Dr. Nadara Eaton Continue Aggrenox for secondary stroke prevention will refill for 1 year then obtain from PCP No further stroke or TIA symptoms in 1.5 years.  If recurrent stroke symptoms occur, call 911 and proceed to the hospital Stop smoking Discharge from neurologic services at this time

## 2015-07-26 NOTE — Progress Notes (Addendum)
[facility]  PATIENT: Mason Patton DOB: 08/26/1945   REASON FOR VISIT: Follow-up for history of stroke, left-sided lacunar infarct HISTORY FROM: Patient    HISTORY OF PRESENT ILLNESS: HISTORY: 13 year Caucasian male who was admitted on 03/22/13 with 1 week h/o headache being treated by primary MD as sinusitis with amoxcicillin and allegra. 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 Spinal tap showed mild elevated protein and csf lymphocytosis. CSF grew no organism and viral titres were negative except elevated [place] virus IgG. MRI brain showed tiny 3 mm left frontal DWI positive lesion and several white matter T 2 shine thru hyperintensities in right corona radiata and occipital lobes consisted with old silent infarcts. He denied any focal left brain symptoms or prior h/o stroke or TIAs.He was started on plavix which he is tolerating well.He has multiple vascular risk factors of HT, diabetes, lipids, obesity and sleep apnoea. Carotid dopplers, intracranial MRA, echo were normal.He has remote h/o transient atrial fibrillation in setting of thyroid dysfunction 10 years ago.  UPDATE11/24/15 Mason Patton 70 year old male returns for follow-up. He was last seen in this office April 24 and had admission to the hospital 11/15/2013 for dizziness lasting approximately 3 hours. MRI of the brain with left-sided lacunar infarct. 2-D echo showed ejection fraction 55-60%. Carotid Dopplers with 1-39% ICA stenosis. Hemoglobin A1c 7.2. His Plavix was stopped and he was switched to Aggrenox. He is currently back at work and has not had further stroke or TIA symptoms. He returns for reevaluation.  Update 12/26/14 Mason Patton, 70 year old male returns for follow-up. He was last seen in the office 05/31/2014. He has a history of lacunar infarcts. He is currently on Aggrenox after admission May 2015 for dizziness lasting  approximately 3 hours. He has not had further episodes. His blood pressure is elevated today at 155/75 and the office, he says he has had recent medication changes by Dr. Duke Salvia. He had recent carotid Doppler as well I do not have those results. He is currently working full-time. He has obstructive sleep apnea and uses CPAP. He returns for reevaluation UPDATE 07/26/2015. Mason Patton 70 year old male returns for follow-up. He was last seen in our office 12/26/2014. He has a history of lacunar infarcts and is currently on Aggrenox. No stroke or TIA symptoms in a year and a half. He has risk factors of hypertension, diabetes, hyperlipidemia and obstructive sleep apnea. He continues to work full-time. He continues to smoke .He has no new complaints  REVIEW OF SYSTEMS: Full 14 system review of systems performed and notable only for those listed, all others are neg:  Constitutional: neg  Cardiovascular: neg Ear/Nose/Throat: neg  Skin: neg Eyes: neg Respiratory: neg Gastroitestinal: neg  Hematology/Lymphatic: neg  Endocrine: neg Musculoskeletal:neg Allergy/Immunology: neg Neurological: neg Psychiatric: neg Sleep : neg   ALLERGIES: Allergies  Allergen Reactions  . Codeine Nausea And Vomiting  . Demerol Nausea And Vomiting    HOME MEDICATIONS: Outpatient Prescriptions Prior to Visit  Medication Sig Dispense Refill  . amLODipine (NORVASC) 10 MG tablet Take 10 mg by mouth daily.    [name] atorvastatin (LIPITOR) 40 MG tablet Take 1 tablet (40 mg total) by mouth daily. 30 tablet 0  . benzonatate (TESSALON) 200 MG capsule     . Blood Glucose Monitoring Suppl (ONETOUCH VERIO FLEX SYSTEM) W/DEVICE KIT 1 each by Does not apply route daily. Dx: E11.9 1 kit 0  . Cyanocobalamin (VITAMIN B-12) 2000 MCG  TBCR Take 1 tablet by mouth daily.    [name] dipyridamole-aspirin (AGGRENOX) 200-25 MG per 12 hr capsule Take 1 capsule by mouth 2 (two) times daily. 60 capsule 8  . glipiZIDE (GLUCOTROL XL) 10 MG 24 hr tablet  TAKE 1 TABLET (10 MG TOTAL) BY MOUTH DAILY WITH BREAKFAST. 30 tablet 2  . glucose blood (ONETOUCH VERIO) test strip Use to test blood sugar 2-3 times daily as instructed. Dx: E11.9 75 each 5  . levothyroxine (LEVOTHROID) 175 MCG tablet Take 1 tablet (175 mcg total) by mouth daily before breakfast. 45 tablet 1  . metFORMIN (GLUCOPHAGE-XR) 500 MG 24 hr tablet TAKE 4 TABLETS (2,000 MG TOTAL) BY MOUTH DAILY WITH BREAKFAST. 120 tablet 1  . metoprolol succinate (TOPROL-XL) 100 MG 24 hr tablet Take 100 mg by mouth daily. Take with or immediately following a meal.    . Multiple Vitamin (MULTIVITAMIN WITH MINERALS) TABS tablet Take 1 tablet by mouth daily.    [name] DELICA LANCETS FINE MISC Use to test blood sugar 2 times daily as instructed. Dx: E11.9 100 each 4  . sitaGLIPtin (JANUVIA) 100 MG tablet Take 1 tablet (100 mg total) by mouth daily. 90 tablet 1  . triamcinolone cream (KENALOG) 0.1 % Apply 1 application topically 2 (two) times daily.   1  . valsartan-hydrochlorothiazide (DIOVAN-HCT) 320-12.5 MG per tablet Take 1 tablet by mouth daily.  1  . VENTOLIN HFA 108 (90 Base) MCG/ACT inhaler     . Vitamin D, Ergocalciferol, (DRISDOL) 50000 UNITS CAPS capsule Take 50,000 Units by mouth every 7 (seven) days.   2  . predniSONE (DELTASONE) 5 MG tablet Reported on 07/26/2015     No facility-administered medications prior to visit.    PAST MEDICAL HISTORY: Past Medical History  Diagnosis Date  . Hypertension   . Thyroid disease   . Gallstones and inflammation of gallbladder without obstruction   . Cholecystitis with cholelithiasis   . Arthritis   . Emphysema of lung ([place])   . Hyperlipidemia   . Eczema   . Umbilical hernia   . Tobacco abuse     quit 11/08/2012  . Obesity   . History of nuclear stress test 02/23/2010    dipyridamole; normal pattern of perfusion in all regions; normal, low risk study   . Meningoencephalitis   . Shortness of breath   . Heart murmur   . Pneumonia     "twice"  (05/19/2013)  . OSA on CPAP     last sleep study >8 yrs  . 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 Type II diabetes mellitus ([place])   . Stroke [facility])     "they said I had a minor one when I had menigitis" (05/19/2013)    PAST SURGICAL HISTORY: Past Surgical History  Procedure Laterality Date  . Knee arthroscopy Right     "w2 times" (05/19/2013)  . Cholecystectomy  09/09/2011    Procedure: LAPAROSCOPIC CHOLECYSTECTOMY WITH INTRAOPERATIVE CHOLANGIOGRAM;  Surgeon: Gwenyth Ober III, MD;  Location: [place];  Service: General;  Laterality: N/A;  . Transthoracic echocardiogram  03/28/2005    EF=>55%, mod conc LVH; LA mod dilated & borderline RA enlargement; mild TR; mild pulm valve regurg  . Joint replacement    . Tonsillectomy    . Hernia repair  04/2006    UHR  . 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 Total knee arthroplasty Left 05/19/2013    Procedure: TOTAL KNEE ARTHROPLASTY;  Surgeon: Illene Regulus  Sharol Given, MD;  Location: [place];  Service: Orthopedics;  Laterality: Left;  Left Total Knee Arthroplasty    FAMILY HISTORY: Family History  Problem Relation Age of Onset  . Cancer Maternal Aunt     colon  . Cancer Maternal Aunt     lung  . Thyroid disease Mother   . Hyperlipidemia Father   . Diabetes Maternal Grandmother   . Asthma Child   . Diabetes type I Child     SOCIAL HISTORY: Social History   Social History  . Marital Status: Married    Spouse Name: sandra  . Number of Children: 3  . Years of Education: tech   Occupational History  .     Social History Main Topics  . Smoking status: Current Every Day Smoker -- 0.50 packs/day for 50 years    Types: Cigarettes  . Smokeless tobacco: Never Used  . Alcohol Use: No  . Drug Use: No  . Sexual Activity: Yes   Other Topics Concern  . Not on file   Social History Narrative     PHYSICAL EXAM  Filed Vitals:   07/26/15 1346  BP: 136/70  Pulse: 58  Height: _0  (1.778 m)  Weight: 275 lb  9.6 oz (125.011 kg)   Body mass index is 39.54 kg/(m^2). General: obese middle aged Caucasian male seated, in no evident distress  Head: head normocephalic and atraumatic. Orohparynx benign  Neck: supple with no carotid or supraclavicular bruits  Cardiovascular: regular rate and rhythm Skin: no rash/petichiae   Neurologic Exam  Mental Status: Awake and fully alert. Oriented to place and time. Recent and remote memory intact. Attention span, concentration and fund of knowledge appropriate. Mood and affect appropriate.  Cranial Nerves: Pupils equal, briskly reactive to light. Extraocular movements full without nystagmus. Visual fields full to confrontation. Hearing intact. Facial sensation intact. Face, tongue, palate moves normally and symmetrically.  Motor: Normal bulk and tone. Normal strength in all tested extremity muscles. No focal weakness Sensory: intact to touch  Coordination: Rapid alternating movements normal in all extremities. Finger-to-nose and heel-to-shin performed accurately bilaterally.  Gait and Station: Arises from chair with mild difficulty. Stance is normal. Gait demonstrates normal stride length and balance . Tandem gait is stable  Reflexes: 1+ and symmetric. DIAGNOSTIC DATA (LABS, IMAGING, TESTING) - I reviewed patient records, labs, notes, testing and imaging myself where available.  Lab Results  Component Value Date   WBC 9.9 11/07/2014   HGB 14.1 11/07/2014   HCT 40.9 11/07/2014   MCV 88.5 11/07/2014   PLT 196 11/07/2014      Component Value Date/Time   NA 140 11/07/2014 1042   K 3.7 11/07/2014 1042   CL 102 11/07/2014 1042   CO2 28 11/07/2014 1042   GLUCOSE 141* 11/07/2014 1042   BUN 10 11/07/2014 1042   CREATININE 0.63 11/07/2014 1042   CALCIUM 9.1 11/07/2014 1042   PROT 6.7 11/07/2014 1042   ALBUMIN 3.8 11/07/2014 1042   AST 17 11/07/2014 1042   ALT 14* 11/07/2014 1042   ALKPHOS 78 11/07/2014 1042   BILITOT 1.0 11/07/2014 1042    GFRNONAA >60 11/07/2014 1042   GFRAA >60 11/07/2014 1042    Lab Results  Component Value Date   HGBA1C 8.1* 07/14/2015    Lab Results  Component Value Date   TSH 18.09* 07/14/2015      ASSESSMENT AND PLAN 70 y.o. year old male has a past medical history of Hypertension; Emphysema of lung; Hyperlipidemia; ; Obesity; OSA  on CPAP; Type II diabetes mellitus; and Stroke here to follow-up. He has not had further stroke or TIA symptoms in 1.5 years..The patient is a current patient of Dr. Leonie Man who is out of the office today . This note is sent to the work in doctor.   PLAN risk factor management is essential to prevent further stroke Continue CPAP at 14cm  every night Keep systolic blood pressure less than 130, today's reading 136/70 Lipids are followed by PCP last LDL 74 keep less than 70 Carotid Doppler to be followed by Dr. Nadyne Coombes Continue Aggrenox for secondary stroke prevention will refill for 1 year then obtain from PCP No further stroke or TIA symptoms in 1.5 years.  If recurrent stroke symptoms occur, call 911 and proceed to the hospital Stop smoking Discharge from neurologic services at this timeVst time 25 min Dennie Bible, [facility], [facility], [facility] [address], [place], [place] [address] [phone]  I reviewed the above note and documentation by the Nurse Practitioner and agree with the history, physical exam, assessment and plan as outlined above. I was immediately available for face-to-face consultation. Star Age, MD, PhD [facility])

## 2015-08-23 ENCOUNTER — Other Ambulatory Visit: Payer: Self-pay | Admitting: Internal Medicine

## 2015-08-28 ENCOUNTER — Other Ambulatory Visit: Payer: Self-pay | Admitting: Internal Medicine

## 2015-09-13 ENCOUNTER — Other Ambulatory Visit: Payer: Self-pay | Admitting: Internal Medicine

## 2015-10-12 ENCOUNTER — Ambulatory Visit (INDEPENDENT_AMBULATORY_CARE_PROVIDER_SITE_OTHER): Payer: 59 | Admitting: Internal Medicine

## 2015-10-12 ENCOUNTER — Encounter: Payer: Self-pay | Admitting: Internal Medicine

## 2015-10-12 ENCOUNTER — Other Ambulatory Visit (INDEPENDENT_AMBULATORY_CARE_PROVIDER_SITE_OTHER): Payer: 59 | Admitting: *Deleted

## 2015-10-12 VITALS — BP 138/70 | HR 63 | Temp 97.6°F | Resp 12 | Wt 280.0 lb

## 2015-10-12 DIAGNOSIS — E1142 Type 2 diabetes mellitus with diabetic polyneuropathy: Secondary | ICD-10-CM

## 2015-10-12 DIAGNOSIS — IMO0002 Reserved for concepts with insufficient information to code with codable children: Secondary | ICD-10-CM

## 2015-10-12 DIAGNOSIS — I639 Cerebral infarction, unspecified: Secondary | ICD-10-CM

## 2015-10-12 DIAGNOSIS — E1165 Type 2 diabetes mellitus with hyperglycemia: Secondary | ICD-10-CM | POA: Diagnosis not present

## 2015-10-12 DIAGNOSIS — E890 Postprocedural hypothyroidism: Secondary | ICD-10-CM

## 2015-10-12 LAB — POCT GLYCOSYLATED HEMOGLOBIN (HGB A1C): HEMOGLOBIN A1C: 7.2

## 2015-10-12 LAB — T4, FREE: FREE T4: 1.3 ng/dL (ref 0.60–1.60)

## 2015-10-12 LAB — TSH: TSH: 2.4 u[IU]/mL (ref 0.35–4.50)

## 2015-10-12 MED ORDER — GLUCOSE BLOOD VI STRP: ORAL_STRIP | Status: DC

## 2015-10-12 MED ORDER — SITAGLIPTIN PHOSPHATE 100 MG PO TABS: ORAL_TABLET | ORAL | Status: DC

## 2015-10-12 MED ORDER — ONETOUCH DELICA LANCETS FINE MISC: Status: DC

## 2015-10-12 MED ORDER — METFORMIN HCL ER 500 MG PO TB24: ORAL_TABLET | ORAL | Status: DC

## 2015-10-12 MED ORDER — GLIPIZIDE ER 10 MG PO TB24: ORAL_TABLET | ORAL | Status: DC

## 2015-10-12 NOTE — Progress Notes (Signed)
Patient ID: Mason Patton, male   DOB: Aug 10, 1945, 70 y.o.   MRN: [id number]  HPI: Mason Patton is a 70 y.o.-year-old male, referred by his PCP, Nena Polio, NP, for management of DM2, dx in 2010, non-insulin-dependent, uncontrolled, with complications (retinopathy, PN, cerebrovascular disease-s/p stroke) and also hypothyroidism. Last visit 3 mo ago. Has [facility] also.  DM2: Last hemoglobin A1c was: Lab Results  Component Value Date   HGBA1C 8.1* 07/14/2015   HGBA1C 7.2* 11/16/2013   HGBA1C 7.7* 03/30/2013  04/25/2015: HbA1c 8.2% 01/31/2015: HbA1c 8.4% 10/13/2014: HbA1c 7.8% 07/28/2014: HbA1c 7.3% 07/23/2013: HbA1c 6.6%  Pt is on: - Metformin ER 2000 mg daily in am (not at dinnertime, as advised) - Glipizide XL 10 mg in am, before breakfast - Januvia 100 mg in am  Pt checks his sugars 1-2x a day: - am: n/c >> 116-165, 180, 217 >> 137-180 >> 100-125 - 2h after b'fast: n/c >> 106-123 >> n/c - before lunch: n/c >> 64-157, 172 >> n/c - 2h after lunch: n/c >> 139-180 >> 139-150 >> n/c - before dinner: n/c >> 121-201 >> 66-157, 203 >> 125-150 - 2h after dinner: n/c >> 117-165 >> 93 >> n/c - bedtime: n/c >> 135 >> n/c - nighttime: n/c No lows. Lowest sugar was 50s - 116 >> 64 >> 68; he has hypoglycemia awareness at 70.  Highest sugar was 200s >> <200.  Glucometer: One Touch Verio  Pt's meals are: - Breakfast: skip or biscuit, eggs, toast, grits - Lunch: fast food - Dinner: meat + potatoes + salads; beans - Snacks: rarely; fruit   - no CKD, last BUN/creatinine:  04/25/2015: BUN/Cr 9/0.7 01/31/2015: Glucose 158, BUN/creatinine 11/0.69, EGFR>60, LFTs normal Lab Results  Component Value Date   BUN 10 11/07/2014   CREATININE 0.63 11/07/2014  ACR 75 On Diovan. - last set of lipids: 04/25/2015: 87/125/27/35 01/31/2015: 113/110/32/74 10/13/2014: 117/116/30/78 Lab Results  Component Value Date   CHOL 97 11/16/2013   HDL 24* 11/16/2013   LDLCALC 39 11/16/2013   TRIG  171* 11/16/2013   CHOLHDL 4.0 11/16/2013  on Lipitor 40 mg daily - last [facility] was in [facility] No DR.  - no numbness and tingling in his feet. + mild burning.   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 - he takes the levothyroxine:  Fasting  In a.m.   Separated by >30 min from breakfast  No PPI, iron, calcium, multivitamin  Reviewed TFTs: TSH higher at last visit despite not changing the levothyroxine dose. I advised compliance and plan to repeat his TFTs in a month and a half. He did not return for labs. Lab Results  Component Value Date   TSH 18.09* 07/14/2015   TSH 0.105* 03/23/2013   FREET4 0.85 07/14/2015  04/25/2015: TSH 2.48 01/31/2015: TSH 5.747 (0.4-5.5) - after starting taking it at night 10/13/2014: TSH 2.619 03/08/2014: TSH 2.134 07/23/2013: TSH 3.869  He also has a history of hypertension, emphysema,  Hyperlipidemia, prostate hypertrophy, OSA, OA, fibromyalgia, heart murmur.  ROS: Constitutional: no weight gain/loss, no fatigue, no subjective hyperthermia/hypothermia Eyes: no blurry vision, no xerophthalmia ENT: no sore throat, no nodules palpated in throat, no dysphagia/odynophagia, no hoarseness Cardiovascular: no CP/SOB/palpitations/leg swelling Respiratory: nocough/SOB/wheezing Gastrointestinal: no N/V/D/C Musculoskeletal: + muscle/no joint aches Skin: no rashes Neurological: no tremors/numbness/tingling/dizziness  I reviewed pt's medications, allergies, PMH, social hx, family hx, and changes were documented in the history of present  illness. Otherwise, unchanged from my initial visit note.  Past Medical History  Diagnosis Date  . Hypertension   . Thyroid disease   . Gallstones and inflammation of gallbladder without obstruction   . Cholecystitis with cholelithiasis   . Arthritis   . Emphysema of lung ([place])   . Hyperlipidemia   . Eczema   . Umbilical hernia   .  Tobacco abuse     quit 11/08/2012  . Obesity   . History of nuclear stress test 02/23/2010    dipyridamole; normal pattern of perfusion in all regions; normal, low risk study   . Meningoencephalitis   . Shortness of breath   . Heart murmur   . Pneumonia     "twice" (05/19/2013)  . OSA on CPAP     last sleep study >8 yrs  . 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 Type II diabetes mellitus ([place])   . Stroke [facility])     "they said I had a minor one when I had menigitis" (05/19/2013)   Past Surgical History  Procedure Laterality Date  . Knee arthroscopy Right     "w2 times" (05/19/2013)  . Cholecystectomy  09/09/2011    Procedure: LAPAROSCOPIC CHOLECYSTECTOMY WITH INTRAOPERATIVE CHOLANGIOGRAM;  Surgeon: Gwenyth Ober III, MD;  Location: [place];  Service: General;  Laterality: N/A;  . Transthoracic echocardiogram  03/28/2005    EF=>55%, mod conc LVH; LA mod dilated & borderline RA enlargement; mild TR; mild pulm valve regurg  . Joint replacement    . Tonsillectomy    . Hernia repair  04/2006    UHR  . 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 Total knee arthroplasty Left 05/19/2013    Procedure: TOTAL KNEE ARTHROPLASTY;  Surgeon: Newt Minion, MD;  Location: [place];  Service: Orthopedics;  Laterality: Left;  Left Total Knee Arthroplasty   Social History   Social History  . Marital Status: Married    Spouse Name: sandra  . Number of Children: 3  . Years of Education: tech   Occupational History  . Senior [employer] [place] History Main Topics  . Smoking status: Current Every Day Smoker -- 0.50 packs/day for 50 years    Types: Cigarettes  . Smokeless tobacco: Never Used  . Alcohol Use: No  . Drug Use: No  . Sexual Activity: Yes   Other Topics Concern  . Not on file   Social History Narrative   Current Outpatient Prescriptions on File Prior to Visit  Medication Sig Dispense Refill  . amLODipine (NORVASC) 10 MG tablet Take 10 mg  by mouth daily.    [name] atorvastatin (LIPITOR) 40 MG tablet Take 1 tablet (40 mg total) by mouth daily. 30 tablet 0  . Blood Glucose Monitoring Suppl (ONETOUCH VERIO FLEX SYSTEM) W/DEVICE KIT 1 each by Does not apply route daily. Dx: E11.9 1 kit 0  . Cyanocobalamin (VITAMIN B-12) 2000 MCG TBCR Take 1 tablet by mouth daily.    [name] dipyridamole-aspirin (AGGRENOX) 200-25 MG 12hr capsule Take 1 capsule by mouth 2 (two) times daily. 60 capsule 11  . glipiZIDE (GLUCOTROL XL) 10 MG 24 hr tablet TAKE 1 TABLET (10 MG TOTAL) BY MOUTH DAILY WITH BREAKFAST. 30 tablet 2  . glucose blood (ONETOUCH VERIO) test strip Use to test blood sugar 2-3 times daily as instructed. Dx: E11.9 75 each 5  . JANUVIA 100 MG tablet Take 1 tablet by mouth  daily 90 tablet  0  . levothyroxine (SYNTHROID, LEVOTHROID) 175 MCG tablet TAKE 1 TABLET DAILY BEFORE BREAKFAST 45 tablet 1  . metFORMIN (GLUCOPHAGE-XR) 500 MG 24 hr tablet TAKE 4 TABLETS (2,000 MG TOTAL) BY MOUTH DAILY WITH BREAKFAST. 120 tablet 2  . metoprolol succinate (TOPROL-XL) 100 MG 24 hr tablet Take 100 mg by mouth daily. Take with or immediately following a meal.    . Multiple Vitamin (MULTIVITAMIN WITH MINERALS) TABS tablet Take 1 tablet by mouth daily.    [name] DELICA LANCETS FINE MISC Use to test blood sugar 2 times daily as instructed. Dx: E11.9 100 each 4  . triamcinolone cream (KENALOG) 0.1 % Apply 1 application topically 2 (two) times daily.   1  . valsartan-hydrochlorothiazide (DIOVAN-HCT) 320-12.5 MG per tablet Take 1 tablet by mouth daily.  1  . VENTOLIN HFA 108 (90 Base) MCG/ACT inhaler     . Vitamin D, Ergocalciferol, (DRISDOL) 50000 UNITS CAPS capsule Take 50,000 Units by mouth every 7 (seven) days.   2   No current facility-administered medications on file prior to visit.   Allergies  Allergen Reactions  . Codeine Nausea And Vomiting  . Demerol Nausea And Vomiting   Family History  Problem Relation Age of Onset  . Cancer Maternal Aunt     colon   . Cancer Maternal Aunt     lung  . Thyroid disease Mother   . Hyperlipidemia Father   . Diabetes Maternal Grandmother   . Asthma Child   . Diabetes type I Child    PE: BP 138/70 mmHg  Pulse 63  Temp(Src) 97.6 F (36.4 C) (Oral)  Resp 12  Wt 280 lb (127.007 kg)  SpO2 97% Body mass index is 40.18 kg/(m^2). Wt Readings from Last 3 Encounters:  10/12/15 280 lb (127.007 kg)  07/26/15 275 lb 9.6 oz (125.011 kg)  07/14/15 265 lb (120.203 kg)   Constitutional: overweight, in NAD Eyes: PERRLA, EOMI, no exophthalmos ENT: moist mucous membranes, no thyromegaly, no cervical lymphadenopathy Cardiovascular: RRR, + 2/6 SEM, no RG Respiratory: CTA B Gastrointestinal: abdomen soft, NT, ND, BS+ Musculoskeletal: no deformities, strength intact in all 4 Skin: moist, warm, no rashes Neurological: no tremor with outstretched hands, DTR normal in all 4  ASSESSMENT: 1. 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 Hypothyroidism  PLAN:  1. Patient with long-standing, uncontrolled diabetes, on oral antidiabetic regimen, with insufficient control. At last visit, sugars and HbA1c were higher after the Holidays, now better. Will move Metformin at dinnertime, OTW no changes. - I suggested to:  Patient Instructions  Please continue: - Metformin ER 2000 mg daily, but move it at dinnertime - Glipizide XL 10 mg in am, before breakfast - Januvia 100 mg in am.  Please return in 3 months with your sugar log.  - continue checking sugars at different times of the day - check 2 times a day, rotating checks - advised for yearly eye exams >> he is UTD - had flu shot and PNA shot this season (at [place]) - checked HbA1c today >> 7.2% (decreased!) - Return to clinic in 3 mo with sugar log   2. Hypothyroidism - postablative - TSH high at last visit w/o changing the dose and espite taking the LT4 daily per his  report >> we continued 175 mcg daily  - discussed about taking LT4 every day, with water, >30 minutes before breakfast, separated by >4 hours from acid reflux medications,  calcium, iron, multivitamins. He is taking correctly. - will check TFTs today  Patient Instructions  Please continue Levothyroxine dose 175 mcg daily in am.  Take the thyroid hormone every day, with water, >30 minutes before breakfast, separated by >4 hours from acid reflux medications, calcium, iron, multivitamins.  Orders Only on 10/12/2015  Component Date Value Ref Range Status  . Hemoglobin A1C 10/12/2015 7.2   Final  Office Visit on 10/12/2015  Component Date Value Ref Range Status  . Free T4 10/12/2015 1.30  0.60 - 1.60 ng/dL Final  . TSH 10/12/2015 2.40  0.35 - 4.50 uIU/mL Final   Excellent TFTs.Continue the same dose of levothyroxine.

## 2015-10-12 NOTE — Patient Instructions (Signed)
Please continue: - Metformin ER 2000 mg daily, but move it at at dinnertime - Glipizide XL 10 mg in am, before breakfast - Januvia 100 mg in am, before breakfast  Please continue Levothyroxine dose 175 mcg daily in am.  Take the thyroid hormone every day, with water, >30 minutes before breakfast, separated by >4 hours from acid reflux medications, calcium, iron, multivitamins.  Please return in 3 months with your sugar log.  Please stop at the lab.

## 2015-10-16 ENCOUNTER — Telehealth: Payer: Self-pay | Admitting: Internal Medicine

## 2015-10-16 NOTE — Telephone Encounter (Signed)
Patient calling back for lab results °

## 2015-10-16 NOTE — Telephone Encounter (Signed)
Called pt, he finally listened to his vm and got his results.

## 2015-10-16 NOTE — Telephone Encounter (Signed)
    Patient calling for lab results 

## 2015-12-06 ENCOUNTER — Other Ambulatory Visit: Payer: Self-pay | Admitting: Internal Medicine

## 2016-01-04 LAB — HM DIABETES EYE EXAM

## 2016-01-11 ENCOUNTER — Encounter: Payer: Self-pay | Admitting: Internal Medicine

## 2016-01-11 ENCOUNTER — Ambulatory Visit (INDEPENDENT_AMBULATORY_CARE_PROVIDER_SITE_OTHER): Payer: 59 | Admitting: Internal Medicine

## 2016-01-11 VITALS — BP 148/70 | HR 64 | Ht 70.0 in | Wt 278.0 lb

## 2016-01-11 DIAGNOSIS — E890 Postprocedural hypothyroidism: Secondary | ICD-10-CM | POA: Diagnosis not present

## 2016-01-11 DIAGNOSIS — E1165 Type 2 diabetes mellitus with hyperglycemia: Secondary | ICD-10-CM

## 2016-01-11 DIAGNOSIS — E1142 Type 2 diabetes mellitus with diabetic polyneuropathy: Secondary | ICD-10-CM | POA: Diagnosis not present

## 2016-01-11 DIAGNOSIS — I639 Cerebral infarction, unspecified: Secondary | ICD-10-CM

## 2016-01-11 DIAGNOSIS — IMO0002 Reserved for concepts with insufficient information to code with codable children: Secondary | ICD-10-CM

## 2016-01-11 LAB — POCT GLYCOSYLATED HEMOGLOBIN (HGB A1C): HEMOGLOBIN A1C: 7.2

## 2016-01-11 MED ORDER — LEVOTHYROXINE SODIUM 175 MCG PO TABS: 175.0000 ug | ORAL_TABLET | Freq: Every day | ORAL | Status: DC

## 2016-01-11 NOTE — Addendum Note (Signed)
Addended by: Darene Lamer T on: 01/11/2016 03:38 PM   Modules accepted: Orders

## 2016-01-11 NOTE — Patient Instructions (Signed)
Please continue: - Metformin ER 2000 mg daily at dinnertime - Glipizide XL 10 mg in am, before breakfast - Januvia 100 mg in am.  Please continue Levothyroxine dose 175 mcg daily in am.  Take the thyroid hormone every day, with water, >30 minutes before breakfast, separated by >4 hours from acid reflux medications, calcium, iron, multivitamins.  Please return in 3 months with your sugar log. 

## 2016-01-11 NOTE — Progress Notes (Signed)
Patient ID: Mason Patton, male   DOB: [birth year], 70 y.o.   MRN: [id number]  HPI: Mason Patton is a 70 y.o.-year-old male, referred by his PCP, Nena Polio, NP, for management of DM2, dx in 2010, non-insulin-dependent, uncontrolled, with complications (retinopathy, PN, cerebrovascular disease-s/p stroke) and also hypothyroidism. Last visit 3 mo ago. Has [facility] also.  DM2: Last hemoglobin A1c was: Lab Results  Component Value Date   HGBA1C 7.2 10/12/2015   HGBA1C 8.1* 07/14/2015   HGBA1C 7.2* 11/16/2013  04/25/2015: HbA1c 8.2% 01/31/2015: HbA1c 8.4% 10/13/2014: HbA1c 7.8% 07/28/2014: HbA1c 7.3% 07/23/2013: HbA1c 6.6%  Pt is on: - Metformin ER 2000 mg daily in am (not at dinnertime, as advised) - Glipizide XL 10 mg in am, before breakfast - Januvia 100 mg in am  Pt checks his sugars 1-2x a day: - am: n/c >> 116-165, 180, 217 >> 137-180 >> 100-125 >> 90-100 - 2h after b'fast: n/c >> 106-123 >> n/c - before lunch: n/c >> 64-157, 172 >> n/c - 2h after lunch: n/c >> 139-180 >> 139-150 >> n/c - before dinner: n/c >> 121-201 >> 66-157, 203 >> 125-150 >> 80-140 - 2h after dinner: n/c >> 117-165 >> 93 >> n/c - bedtime: n/c >> 135 >> n/c - nighttime: n/c No lows. Lowest sugar was 50s - 116 >> 64 >> 68 >> 60 (did not eat b'fast); he has hypoglycemia awareness at 70.  Highest sugar was 200s >> <200 >> 150s.  Glucometer: One Touch Verio  Pt's meals are: - Breakfast: skip or biscuit, eggs, toast, grits - Lunch: fast food - Dinner: meat + potatoes + salads; beans - Snacks: rarely; fruit   - no CKD, last BUN/creatinine:  04/25/2015: BUN/Cr 9/0.7 01/31/2015: Glucose 158, BUN/creatinine 11/0.69, EGFR>60, LFTs normal Lab Results  Component Value Date   BUN 10 11/07/2014   CREATININE 0.63 11/07/2014  ACR 75 On Diovan. - last set of lipids: 04/25/2015: 87/125/27/35 01/31/2015: 113/110/32/74 10/13/2014: 117/116/30/78 Lab Results  Component Value Date   CHOL 97 11/16/2013    HDL 24* 11/16/2013   LDLCALC 39 11/16/2013   TRIG 171* 11/16/2013   CHOLHDL 4.0 11/16/2013  on Lipitor 40 mg daily - last eye exam was in 12/2015 ([place]). + DR.  - no numbness and tingling in his feet. + mild burning.   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 - he takes the levothyroxine:  Fasting  In a.m.   Separated by >30 min from breakfast  No PPI, iron, calcium, multivitamin  Reviewed TFTs: TSH higher at last visit despite not changing the levothyroxine dose. I advised compliance and plan to repeat his TFTs in a month and a half. He did not return for labs. Lab Results  Component Value Date   TSH 2.40 10/12/2015   TSH 18.09* 07/14/2015   TSH 0.105* 03/23/2013   FREET4 1.30 10/12/2015   FREET4 0.85 07/14/2015  04/25/2015: TSH 2.48 01/31/2015: TSH 5.747 (0.4-5.5) - after starting taking it at night 10/13/2014: TSH 2.619 03/08/2014: TSH 2.134 07/23/2013: TSH 3.869  He also has a history of hypertension, emphysema,  Hyperlipidemia, prostate hypertrophy, OSA, OA, fibromyalgia, heart murmur.  ROS: Constitutional: no weight gain/loss, no fatigue, no subjective hyperthermia/hypothermia Eyes: no blurry vision, no xerophthalmia ENT: no sore throat, no nodules palpated in throat, no dysphagia/odynophagia, no hoarseness Cardiovascular: no CP/SOB/palpitations/leg swelling Respiratory: nocough/SOB/wheezing Gastrointestinal: no N/V/D/C Musculoskeletal: no muscle/no joint aches Skin: no rashes  Neurological: no tremors/numbness/tingling/dizziness  I reviewed pt's medications, allergies, PMH, social hx, family hx, and changes were documented in the history of present illness. Otherwise, unchanged from my initial visit note.  Past Medical History  Diagnosis Date  . Hypertension   . Thyroid disease   . Gallstones and inflammation of gallbladder without obstruction   . Cholecystitis with  cholelithiasis   . Arthritis   . Emphysema of lung ([place])   . Hyperlipidemia   . Eczema   . Umbilical hernia   . Tobacco abuse     quit 11/08/2012  . Obesity   . History of nuclear stress test 02/23/2010    dipyridamole; normal pattern of perfusion in all regions; normal, low risk study   . Meningoencephalitis   . Shortness of breath   . Heart murmur   . Pneumonia     "twice" (05/19/2013)  . OSA on CPAP     last sleep study >8 yrs  . 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 Type II diabetes mellitus ([place])   . Stroke [facility])     "they said I had a minor one when I had menigitis" (05/19/2013)   Past Surgical History  Procedure Laterality Date  . Knee arthroscopy Right     "w2 times" (05/19/2013)  . Cholecystectomy  09/09/2011    Procedure: LAPAROSCOPIC CHOLECYSTECTOMY WITH INTRAOPERATIVE CHOLANGIOGRAM;  Surgeon: Gwenyth Ober III, MD;  Location: [place];  Service: General;  Laterality: N/A;  . Transthoracic echocardiogram  03/28/2005    EF=>55%, mod conc LVH; LA mod dilated & borderline RA enlargement; mild TR; mild pulm valve regurg  . Joint replacement    . Tonsillectomy    . Hernia repair  04/2006    UHR  . 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 Total knee arthroplasty Left 05/19/2013    Procedure: TOTAL KNEE ARTHROPLASTY;  Surgeon: Newt Minion, MD;  Location[place]  Service: Orthopedics;  Laterality: Left;  Left Total Knee Arthroplasty   Social History   Social History  . Marital Status: Married    Spouse Name: sandra  . Number of Children: 3  . Years of Education: tech   Occupational History  . Senior [employer] [place] History Main Topics  . Smoking status: Current Every Day Smoker -- 0.50 packs/day for 50 years    Types: Cigarettes  . Smokeless tobacco: Never Used  . Alcohol Use: No  . Drug Use: No  . Sexual Activity: Yes   Other Topics Concern  . Not on file   Social History Narrative   Current Outpatient  Prescriptions on File Prior to Visit  Medication Sig Dispense Refill  . amLODipine (NORVASC) 10 MG tablet Take 10 mg by mouth daily.    . Blood Glucose Monitoring Suppl (ONETOUCH VERIO FLEX SYSTEM) W/DEVICE KIT 1 each by Does not apply route daily. Dx: E11.9 1 kit 0  . Cyanocobalamin (VITAMIN B-12) 2000 MCG TBCR Take 1 tablet by mouth daily.    [name] dipyridamole-aspirin (AGGRENOX) 200-25 MG 12hr capsule Take 1 capsule by mouth 2 (two) times daily. 60 capsule 11  . glipiZIDE (GLUCOTROL XL) 10 MG 24 hr tablet TAKE 1 TABLET (10 MG TOTAL) BY MOUTH DAILY WITH BREAKFAST. 90 tablet 3  . glucose blood (ONETOUCH VERIO) test strip Use to test blood sugar 2-3 times daily as instructed. Dx: E11.9 200 each 3  . levothyroxine (SYNTHROID, LEVOTHROID) 175 MCG tablet TAKE 1 TABLET  DAILY BEFORE BREAKFAST 45 tablet 2  . metFORMIN (GLUCOPHAGE-XR) 500 MG 24 hr tablet TAKE 4 TABLETS (2,000 MG TOTAL) BY MOUTH DAILY WITH DINNER 360 tablet 3  . metoprolol succinate (TOPROL-XL) 100 MG 24 hr tablet Take 100 mg by mouth daily. Take with or immediately following a meal.    . ONETOUCH DELICA LANCETS FINE MISC Use to test blood sugar 2-3 times daily as instructed. Dx: E11.9 200 each 3  . sitaGLIPtin (JANUVIA) 100 MG tablet Take 1 tablet by mouth  daily 90 tablet 3  . triamcinolone cream (KENALOG) 0.1 % Apply 1 application topically 2 (two) times daily.   1  . valsartan-hydrochlorothiazide (DIOVAN-HCT) 320-12.5 MG per tablet Take 1 tablet by mouth daily.  1  . VENTOLIN HFA 108 (90 Base) MCG/ACT inhaler     . Vitamin D, Ergocalciferol, (DRISDOL) 50000 UNITS CAPS capsule Take 50,000 Units by mouth every 7 (seven) days.   2  . atorvastatin (LIPITOR) 40 MG tablet Take 1 tablet (40 mg total) by mouth daily. (Patient not taking: Reported on 01/11/2016) 30 tablet 0   No current facility-administered medications on file prior to visit.   Allergies  Allergen Reactions  . Codeine Nausea And Vomiting  . Demerol Nausea And Vomiting    Family History  Problem Relation Age of Onset  . Cancer Maternal Aunt     colon  . Cancer Maternal Aunt     lung  . Thyroid disease Mother   . Hyperlipidemia Father   . Diabetes Maternal Grandmother   . Asthma Child   . Diabetes type I Child    PE: BP 148/70 mmHg  Pulse 64  Ht '5\' 10"'  (1.778 m)  Wt 278 lb (126.1 kg)  BMI 39.89 kg/m2  SpO2 95% Body mass index is 39.89 kg/(m^2). Wt Readings from Last 3 Encounters:  01/11/16 278 lb (126.1 kg)  10/12/15 280 lb (127.007 kg)  07/26/15 275 lb 9.6 oz (125.011 kg)   Constitutional: overweight, in NAD Eyes: PERRLA, EOMI, no exophthalmos ENT: moist mucous membranes, no thyromegaly, no cervical lymphadenopathy Cardiovascular: RRR, + 2/6 SEM, no RG Respiratory: CTA B Gastrointestinal: abdomen soft, NT, ND, BS+ Musculoskeletal: no deformities, strength intact in all 4 Skin: moist, warm, no rashes Neurological: no tremor with outstretched hands, DTR normal in all 4  ASSESSMENT: 1. 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 Hypothyroidism  PLAN:  1. Patient with long-standing, uncontrolled diabetes, on oral antidiabetic regimen, with fair control. Will not change regimen. - I suggested to:  Patient Instructions  Please continue: - Metformin ER 2000 mg daily at dinnertime - Glipizide XL 10 mg in am, before breakfast - Januvia 100 mg in am.  Please return in 3 months with your sugar log.  - continue checking sugars at different times of the day - check 2 times a day, rotating checks - advised for yearly eye exams >> he is UTD - checked HbA1c today >> 7.2% (stable) - Return to clinic in 3 mo with sugar log   2. Hypothyroidism - postablative - TSH normal on 175 mcg daily  - discussed about taking LT4 every day, with water, >30 minutes before breakfast, separated by >4 hours from acid reflux medications, calcium, iron,  multivitamins. He is taking correctly. - will check TFTs at next visit  Patient Instructions  Please continue Levothyroxine dose 175 mcg daily in am.  Take the thyroid hormone every day, with water, >  30 minutes before breakfast, separated by >4 hours from acid reflux medications, calcium, iron, multivitamins.

## 2016-01-12 ENCOUNTER — Telehealth: Payer: Self-pay | Admitting: Endocrinology

## 2016-01-12 MED ORDER — GLIPIZIDE ER 10 MG PO TB24: ORAL_TABLET | ORAL | Status: DC

## 2016-01-12 MED ORDER — SITAGLIPTIN PHOSPHATE 100 MG PO TABS: ORAL_TABLET | ORAL | Status: DC

## 2016-01-12 MED ORDER — LEVOTHYROXINE SODIUM 175 MCG PO TABS: 175.0000 ug | ORAL_TABLET | Freq: Every day | ORAL | Status: DC

## 2016-01-12 MED ORDER — METFORMIN HCL ER 500 MG PO TB24: ORAL_TABLET | ORAL | Status: DC

## 2016-01-12 NOTE — Telephone Encounter (Addendum)
Rx for Januvia, Levothyroxine, glipizide and Metformin have been submitted to [facility]. Pt advised rx's have been submitted. Pt also advised to request the refill of his atorvastain from his PCP. Pt voiced understanding.

## 2016-01-12 NOTE — Telephone Encounter (Signed)
At[facility]vastatin needs to be called [facility] for 30 days because he is almost out but please do still call into [facility] as well

## 2016-01-12 NOTE — Addendum Note (Signed)
Addended by: Ann Maki T on: 01/12/2016 11:05 AM   Modules accepted: Orders

## 2016-01-12 NOTE — Telephone Encounter (Signed)
Pt is asking for all of his meds to be called in for 90 day supply called into [facility]  Anything Dr. Everardo All rxs.

## 2016-04-12 ENCOUNTER — Telehealth: Payer: Self-pay

## 2016-04-12 ENCOUNTER — Ambulatory Visit (INDEPENDENT_AMBULATORY_CARE_PROVIDER_SITE_OTHER): Payer: 59 | Admitting: Internal Medicine

## 2016-04-12 ENCOUNTER — Encounter: Payer: Self-pay | Admitting: Internal Medicine

## 2016-04-12 VITALS — BP 132/78 | HR 54 | Ht 70.0 in | Wt 276.0 lb

## 2016-04-12 DIAGNOSIS — E1165 Type 2 diabetes mellitus with hyperglycemia: Secondary | ICD-10-CM

## 2016-04-12 DIAGNOSIS — E890 Postprocedural hypothyroidism: Secondary | ICD-10-CM | POA: Diagnosis not present

## 2016-04-12 DIAGNOSIS — E1142 Type 2 diabetes mellitus with diabetic polyneuropathy: Secondary | ICD-10-CM

## 2016-04-12 DIAGNOSIS — IMO0002 Reserved for concepts with insufficient information to code with codable children: Secondary | ICD-10-CM

## 2016-04-12 DIAGNOSIS — I639 Cerebral infarction, unspecified: Secondary | ICD-10-CM

## 2016-04-12 LAB — POCT GLYCOSYLATED HEMOGLOBIN (HGB A1C): HEMOGLOBIN A1C: 7.1

## 2016-04-12 LAB — LIPID PANEL
CHOLESTEROL: 106 mg/dL (ref 0–200)
HDL: 27.5 mg/dL — AB (ref >39.00)
LDL Cholesterol: 57 mg/dL (ref 0–99)
NonHDL: 78.31
TRIGLYCERIDES: 109 mg/dL (ref 0.0–149.0)
Total CHOL/HDL Ratio: 4
VLDL: 21.8 mg/dL (ref 0.0–40.0)

## 2016-04-12 LAB — TSH: TSH: 2.58 u[IU]/mL (ref 0.35–4.50)

## 2016-04-12 LAB — T4, FREE: Free T4: 1.11 ng/dL (ref 0.60–1.60)

## 2016-04-12 NOTE — Patient Instructions (Signed)
Please continue: - Metformin ER 2000 mg daily at dinnertime - Glipizide XL 10 mg in am, before breakfast - Januvia 100 mg in am.  Please continue Levothyroxine dose 175 mcg daily in am.  Take the thyroid hormone every day, with water, >30 minutes before breakfast, separated by >4 hours from acid reflux medications, calcium, iron, multivitamins.  Please return in 3 months with your sugar log. 

## 2016-04-12 NOTE — Telephone Encounter (Signed)
Patient request to talk to wife about lab results, as he does not understand them.

## 2016-04-12 NOTE — Progress Notes (Signed)
Patient ID: Mason Patton, male   DOB: 02/11/1946, 70 y.o.   MRN: [id number]  HPI: Mason Patton is a 70 y.o.-year-old male, referred by his PCP, Mason Polio, NP, for management of DM2, dx in 2010, non-insulin-dependent, uncontrolled, with complications (retinopathy, PN, cerebrovascular disease-s/p stroke) and also hypothyroidism. Last visit 3 mo ago. Has [facility] also.  DM2: Last hemoglobin A1c was: Lab Results  Component Value Date   HGBA1C 7.2 01/11/2016   HGBA1C 7.2 10/12/2015   HGBA1C 8.1 (H) 07/14/2015  04/25/2015: HbA1c 8.2% 01/31/2015: HbA1c 8.4% 10/13/2014: HbA1c 7.8% 07/28/2014: HbA1c 7.3% 07/23/2013: HbA1c 6.6%  Pt is on: - Metformin ER 2000 mg daily in am (not at dinnertime, as advised) - Glipizide XL 10 mg in am, before breakfast - Januvia 100 mg in am  Pt checks his sugars 1-2x a day: - am: n/c >> 116-165, 180, 217 >> 137-180 >> 100-125 >> 90-100 >> 70s, 94-100 - 2h after b'fast: n/c >> 106-123 >> n/c - before lunch: n/c >> 64-157, 172 >> n/c - 2h after lunch: n/c >> 139-180 >> 139-150 >> n/c - before dinner: n/c >> 121-201 >> 66-157, 203 >> 125-150 >> 80-140 >> 90-160 (snack) - 2h after dinner: n/c >> 117-165 >> 93 >> n/c - bedtime: n/c >> 135 >> n/c - nighttime: n/c No lows. Lowest sugar was 50s - 116 >> 64 >> 68 >> 60 (did not eat b'fast) >> 70s; he has hypoglycemia awareness at 70.  Highest sugar was 200s >> <200 >> 150s >> 180.  Glucometer: One Touch Verio  Pt's meals are: - Breakfast: skip or biscuit, eggs, toast, grits - Lunch: fast food - Dinner: meat + potatoes + salads; beans - Snacks: rarely; fruit   - no CKD, last BUN/creatinine:  04/25/2015: BUN/Cr 9/0.7 01/31/2015: Glucose 158, BUN/creatinine 11/0.69, EGFR>60, LFTs normal Lab Results  Component Value Date   BUN 10 11/07/2014   CREATININE 0.63 11/07/2014  ACR 75 On Diovan. - last set of lipids: 04/25/2015: 87/125/27/35 01/31/2015: 113/110/32/74 10/13/2014: 117/116/30/78 Lab Results   Component Value Date   CHOL 97 11/16/2013   HDL 24 (L) 11/16/2013   LDLCALC 39 11/16/2013   TRIG 171 (H) 11/16/2013   CHOLHDL 4.0 11/16/2013  on Lipitor 40 mg daily - last eye exam was in 12/2015 ([place]). + DR.  - no numbness and tingling in his feet. + mild burning.   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 - he takes the levothyroxine:  Fasting  In a.m.   Separated by >30 min from breakfast  No PPI, iron, calcium, multivitamin  Reviewed TFTs: Lab Results  Component Value Date   TSH 2.40 10/12/2015   TSH 18.09 (H) 07/14/2015   TSH 0.105 (L) 03/23/2013   FREET4 1.30 10/12/2015   FREET4 0.85 07/14/2015  04/25/2015: TSH 2.48 01/31/2015: TSH 5.747 (0.4-5.5) - after starting taking it at night 10/13/2014: TSH 2.619 03/08/2014: TSH 2.134 07/23/2013: TSH 3.869  He also has a history of hypertension, emphysema,  Hyperlipidemia, prostate hypertrophy, OSA, OA, fibromyalgia, heart murmur.  ROS: Constitutional: no weight gain/loss, + fatigue, no subjective hyperthermia/hypothermia, + nocturia Eyes: no blurry vision, no xerophthalmia ENT: no sore throat, no nodules palpated in throat, no dysphagia/odynophagia, no hoarseness Cardiovascular: no CP/SOB/palpitations/leg swelling Respiratory: nocough/SOB/wheezing Gastrointestinal: no N/V/D/C Musculoskeletal: no muscle/no joint aches Skin: no rashes Neurological: no tremors/numbness/tingling/dizziness + diff with erections  I reviewed pt's medications, allergies, PMH, social  hx, family hx, and changes were documented in the history of present illness. Otherwise, unchanged from my initial visit note.  Past Medical History:  Diagnosis Date  . Arthritis   . Cholecystitis with cholelithiasis   . Eczema   . Emphysema of lung ([place])   . Gallstones and inflammation of gallbladder without obstruction   . Heart murmur   . History of nuclear stress test  02/23/2010   dipyridamole; normal pattern of perfusion in all regions; normal, low risk study   . Hyperlipidemia   . Hypertension   . 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 Meningoencephalitis   . Obesity   . OSA on CPAP    last sleep study >8 yrs  . Pneumonia    "twice" (05/19/2013)  . Shortness of breath   . Stroke [facility])    "they said I had a minor one when I had menigitis" (05/19/2013)  . Thyroid disease   . Tobacco abuse    quit 11/08/2012  . Type II diabetes mellitus ([place])   . Umbilical hernia    Past Surgical History:  Procedure Laterality Date  . CHOLECYSTECTOMY  09/09/2011   Procedure: LAPAROSCOPIC CHOLECYSTECTOMY WITH INTRAOPERATIVE CHOLANGIOGRAM;  Surgeon: Belva Crome, MD;  Location: [place];  Service: General;  Laterality: N/A;  . HERNIA REPAIR  04/2006   UHR  . JOINT REPLACEMENT    . KNEE ARTHROSCOPY Right    "w2 times" (05/19/2013)  . TONSILLECTOMY    . TOTAL KNEE ARTHROPLASTY Left 05/19/2013  . TOTAL KNEE ARTHROPLASTY Left 05/19/2013   Procedure: TOTAL KNEE ARTHROPLASTY;  Surgeon: Newt Minion, MD;  Location: [place];  Service: Orthopedics;  Laterality: Left;  Left Total Knee Arthroplasty  . TRANSTHORACIC ECHOCARDIOGRAM  03/28/2005   EF=>55%, mod conc LVH; LA mod dilated & borderline RA enlargement; mild TR; mild pulm valve regurg   Social History   Social History  . Marital Status: Married    Spouse Name: Mason Patton  . Number of Children: 3  . Years of Education: tech   Occupational History  . 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 Smoking status: Current Every Day Smoker -- 0.50 packs/day for 50 years    Types: Cigarettes  . Smokeless tobacco: Never Used  . Alcohol Use: No  . Drug Use: No  . Sexual Activity: Yes   Other Topics Concern  . Not on file   Social History Narrative   Current Outpatient Prescriptions on File Prior to Visit  Medication Sig Dispense Refill  . amLODipine (NORVASC) 10  MG tablet Take 10 mg by mouth daily.    [name] atorvastatin (LIPITOR) 20 MG tablet Reported on 01/11/2016    . Blood Glucose Monitoring Suppl (ONETOUCH VERIO FLEX SYSTEM) W/DEVICE KIT 1 each by Does not apply route daily. Dx: E11.9 1 kit 0  . Cyanocobalamin (VITAMIN B-12) 2000 MCG TBCR Take 1 tablet by mouth daily.    [name] dipyridamole-aspirin (AGGRENOX) 200-25 MG 12hr capsule Take 1 capsule by mouth 2 (two) times daily. 60 capsule 11  . glipiZIDE (GLUCOTROL XL) 10 MG 24 hr tablet TAKE 1 TABLET (10 MG TOTAL) BY MOUTH DAILY WITH BREAKFAST. 90 tablet 3  . glucose blood (ONETOUCH VERIO) test strip Use to test blood sugar 2-3 times daily as instructed. Dx: E11.9 200 each 3  . levothyroxine (SYNTHROID, LEVOTHROID) 175 MCG tablet Take 1 tablet (175 mcg total) by mouth daily before breakfast. 90 tablet  1  . metFORMIN (GLUCOPHAGE-XR) 500 MG 24 hr tablet TAKE 4 TABLETS (2,000 MG TOTAL) BY MOUTH DAILY WITH DINNER 360 tablet 3  . metoprolol succinate (TOPROL-XL) 100 MG 24 hr tablet Take 100 mg by mouth daily. Take with or immediately following a meal.    . ONETOUCH DELICA LANCETS FINE MISC Use to test blood sugar 2-3 times daily as instructed. Dx: E11.9 200 each 3  . sitaGLIPtin (JANUVIA) 100 MG tablet Take 1 tablet by mouth  daily 90 tablet 3  . triamcinolone cream (KENALOG) 0.1 % Apply 1 application topically 2 (two) times daily.   1  . valsartan-hydrochlorothiazide (DIOVAN-HCT) 320-12.5 MG per tablet Take 1 tablet by mouth daily.  1  . VENTOLIN HFA 108 (90 Base) MCG/ACT inhaler     . Vitamin D, Ergocalciferol, (DRISDOL) 50000 UNITS CAPS capsule Take 50,000 Units by mouth every 7 (seven) days.   2   No current facility-administered medications on file prior to visit.    Allergies  Allergen Reactions  . Codeine Nausea And Vomiting  . Demerol Nausea And Vomiting   Family History  Problem Relation Age of Onset  . Cancer Maternal Aunt     colon  . Cancer Maternal Aunt     lung  . Thyroid disease Mother   .  Hyperlipidemia Father   . Diabetes Maternal Grandmother   . Asthma Child   . Diabetes type I Child    PE: BP 132/78 (BP Location: Left Arm, Patient Position: Sitting)   Pulse (!) 54   Ht '5\' 10"'  (1.778 m)   Wt 276 lb (125.2 kg)   SpO2 96%   BMI 39.60 kg/m  Body mass index is 39.6 kg/m. Wt Readings from Last 3 Encounters:  04/12/16 276 lb (125.2 kg)  01/11/16 278 lb (126.1 kg)  10/12/15 280 lb (127 kg)   Constitutional: overweight, in NAD Eyes: PERRLA, EOMI, no exophthalmos ENT: moist mucous membranes, no thyromegaly, no cervical lymphadenopathy Cardiovascular: RRR, + 2/6 SEM, no RG Respiratory: CTA B Gastrointestinal: abdomen soft, NT, ND, BS+ Musculoskeletal: no deformities, strength intact in all 4 Skin: moist, warm, no rashes Neurological: no tremor with outstretched hands, DTR normal in all 4  ASSESSMENT: 1. 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 Hypothyroidism  PLAN:  1. Patient with long-standing, uncontrolled diabetes, on oral antidiabetic regimen, with fair control. Will not change regimen. - I suggested to:  Patient Instructions  Please continue: - Metformin ER 2000 mg daily at dinnertime - Glipizide XL 10 mg in am, before breakfast - Januvia 100 mg in am.  Please return in 3 months with your sugar log.  - continue checking sugars at different times of the day - check 2 times a day, rotating checks - advised for yearly eye exams >> he is UTD - needs CMP and Lipids today - had flu shot today - checked HbA1c today >> 7.1% (stable) - Return to clinic in 3 mo with sugar log   2. Hypothyroidism - postablative - TSH normal on 175 mcg daily  - discussed about taking LT4 every day, with water, >30 minutes before breakfast, separated by >4 hours from acid reflux medications, calcium, iron, multivitamins. He is taking correctly. - will check TFTs  today  Patient Instructions  Please continue Levothyroxine dose 175 mcg daily in am.  Take the thyroid hormone every day, with water, >30 minutes before breakfast, separated by >4 hours from  acid reflux medications, calcium, iron, multivitamins.  Office Visit on 04/12/2016  Component Date Value Ref Range Status  . Free T4 04/12/2016 1.11  0.60 - 1.60 ng/dL Final   Comment: Specimens from patients who are undergoing biotin therapy and /or ingesting biotin supplements may contain high levels of biotin.  The higher biotin concentration in these specimens interferes with this Free T4 assay.  Specimens that contain high levels  of biotin may cause false high results for this Free T4 assay.  Please interpret results in light of the total clinical presentation of the patient.    [name] TSH 04/12/2016 2.58  0.35 - 4.50 uIU/mL Final  . Cholesterol 04/12/2016 106  0 - 200 mg/dL Final  . Triglycerides 04/12/2016 109.0  0.0 - 149.0 mg/dL Final  . HDL 04/12/2016 27.50* >39.00 mg/dL Final  . VLDL 04/12/2016 21.8  0.0 - 40.0 mg/dL Final  . LDL Cholesterol 04/12/2016 57  0 - 99 mg/dL Final  . Total CHOL/HDL Ratio 04/12/2016 4   Final  . NonHDL 04/12/2016 78.31   Final  . Sodium 04/16/2016 138  135 - 146 mmol/L Final  . Potassium 04/16/2016 4.3  3.5 - 5.3 mmol/L Final  . Chloride 04/16/2016 99  98 - 110 mmol/L Final  . CO2 04/16/2016 30  20 - 31 mmol/L Final  . Glucose, Bld 04/16/2016 90  65 - 99 mg/dL Final  . BUN 04/16/2016 17  7 - 25 mg/dL Final  . Creat 04/16/2016 0.77  0.70 - 1.18 mg/dL Final   Comment:   For patients > or = 70 years of age: The upper reference limit for Creatinine is approximately 13% higher for people identified as African-American.     . Total Bilirubin 04/16/2016 0.9  0.2 - 1.2 mg/dL Final  . Alkaline Phosphatase 04/16/2016 83  40 - 115 U/L Final  . AST 04/16/2016 11  10 - 35 U/L Final  . ALT 04/16/2016 8* 9 - 46 U/L Final  . Total Protein 04/16/2016 6.7  6.1 - 8.1 g/dL  Final  . Albumin 04/16/2016 4.1  3.6 - 5.1 g/dL Final  . Calcium 04/16/2016 9.6  8.6 - 10.3 mg/dL Final  . GFR, Est African American 04/16/2016 >89  >=60 mL/min Final  . GFR, Est Non African American 04/16/2016 >89  >=60 mL/min Final  . Hemoglobin A1C 04/12/2016 7.1   Final   Labs are good except low HDL.  Philemon Kingdom, MD PhD [facility] Endocrinology

## 2016-04-16 ENCOUNTER — Telehealth: Payer: Self-pay

## 2016-04-16 LAB — COMPLETE METABOLIC PANEL WITH GFR
ALT: 8 U/L — ABNORMAL LOW (ref 9–46)
AST: 11 U/L (ref 10–35)
Albumin: 4.1 g/dL (ref 3.6–5.1)
Alkaline Phosphatase: 83 U/L (ref 40–115)
BUN: 17 mg/dL (ref 7–25)
CHLORIDE: 99 mmol/L (ref 98–110)
CO2: 30 mmol/L (ref 20–31)
Calcium: 9.6 mg/dL (ref 8.6–10.3)
Creat: 0.77 mg/dL (ref 0.70–1.18)
GFR, Est African American: >89 mL/min (ref >=60)
GLUCOSE: 90 mg/dL (ref 65–99)
POTASSIUM: 4.3 mmol/L (ref 3.5–5.3)
SODIUM: 138 mmol/L (ref 135–146)
TOTAL PROTEIN: 6.7 g/dL (ref 6.1–8.1)
Total Bilirubin: 0.9 mg/dL (ref 0.2–1.2)

## 2016-04-16 NOTE — Telephone Encounter (Signed)
Called and spoke with wife. Advised all lab results were good, she stated she would tell her husband, She had no other questions at this time.

## 2016-06-30 ENCOUNTER — Other Ambulatory Visit: Payer: Self-pay | Admitting: Internal Medicine

## 2016-07-16 ENCOUNTER — Encounter: Payer: Self-pay | Admitting: Internal Medicine

## 2016-07-16 ENCOUNTER — Ambulatory Visit (INDEPENDENT_AMBULATORY_CARE_PROVIDER_SITE_OTHER): Payer: 59 | Admitting: Internal Medicine

## 2016-07-16 VITALS — BP 148/92 | HR 58 | Ht 70.0 in | Wt 283.0 lb

## 2016-07-16 DIAGNOSIS — E1142 Type 2 diabetes mellitus with diabetic polyneuropathy: Secondary | ICD-10-CM

## 2016-07-16 DIAGNOSIS — IMO0002 Reserved for concepts with insufficient information to code with codable children: Secondary | ICD-10-CM

## 2016-07-16 DIAGNOSIS — E890 Postprocedural hypothyroidism: Secondary | ICD-10-CM | POA: Diagnosis not present

## 2016-07-16 DIAGNOSIS — E1165 Type 2 diabetes mellitus with hyperglycemia: Secondary | ICD-10-CM | POA: Diagnosis not present

## 2016-07-16 LAB — POCT GLYCOSYLATED HEMOGLOBIN (HGB A1C): HEMOGLOBIN A1C: 8.4

## 2016-07-16 NOTE — Progress Notes (Signed)
Patient ID: Mason Patton, male   DOB: 11-Nov-1945, 71 y.o.   MRN: [id number]  HPI: Mason Patton is a 71 y.o.-year-old male, referred by his PCP, Nena Polio, NP, for management of DM2, dx in 2010, non-insulin-dependent, uncontrolled, with complications (retinopathy, PN, cerebrovascular disease-s/p stroke) and also hypothyroidism. Last visit 3 mo ago. Has [facility] also.  DM2: Last hemoglobin A1c was: Lab Results  Component Value Date   HGBA1C 7.1 04/12/2016   HGBA1C 7.2 01/11/2016   HGBA1C 7.2 10/12/2015  04/25/2015: HbA1c 8.2% 01/31/2015: HbA1c 8.4% 10/13/2014: HbA1c 7.8% 07/28/2014: HbA1c 7.3% 07/23/2013: HbA1c 6.6%  Pt is on: - Metformin ER 2000 mg daily in am (not at dinnertime, as advised) - Glipizide XL 10 mg in am, before breakfast - Januvia 100 mg in am  Pt checks his sugars 1-2x a day: - am: n/c >> 116-165, 180, 217 >> 137-180 >> 100-125 >> 90-100 >> 70s, 94-100 >> 98-130, 140 - 2h after b'fast: n/c >> 106-123 >> n/c - before lunch: n/c >> 64-157, 172 >> n/c - 2h after lunch: n/c >> 139-180 >> 139-150 >> n/c - before dinner: 125-150 >> 80-140 >> 90-160 (snack) >> 120-130s before the Holidays, 180-200 after the Holidays - 2h after dinner: n/c >> 117-165 >> 93 >> n/c >> 200 - bedtime: n/c >> 135 >> n/c - nighttime: n/c No lows. Lowest sugar was 70s >> 98; he has hypoglycemia awareness at 70.  Highest sugar was 180 >> 200s.  Glucometer: One Touch Verio  Pt's meals are: - Breakfast: skip or biscuit, eggs, toast, grits - Lunch: fast food - Dinner: meat + potatoes + salads; beans - Snacks: rarely; fruit   - no CKD, last BUN/creatinine:  Lab Results  Component Value Date   BUN 17 04/12/2016   CREATININE 0.77 04/12/2016  04/25/2015: BUN/Cr 9/0.7 01/31/2015: Glucose 158, BUN/creatinine 11/0.69, EGFR>60, LFTs normal ACR 75 On Diovan. - last set of lipids:  Lab Results  Component Value Date   CHOL 106 04/12/2016   HDL 27.50 (L) 04/12/2016   LDLCALC 57  04/12/2016   TRIG 109.0 04/12/2016   CHOLHDL 4 04/12/2016  04/25/2015: 87/125/27/35 01/31/2015: 113/110/32/74 10/13/2014: 117/116/30/78 on Lipitor 40 mg daily - last eye exam was in 12/2015 ([place]). + DR.  - no numbness and tingling in his feet. + mild burning.   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 - he takes the levothyroxine:  Fasting  In a.m.   Separated by >30 min from breakfast  No PPI, iron, calcium, multivitamin  Reviewed TFTs: Lab Results  Component Value Date   TSH 2.58 04/12/2016   TSH 2.40 10/12/2015   TSH 18.09 (H) 07/14/2015   TSH 0.105 (L) 03/23/2013   FREET4 1.11 04/12/2016   FREET4 1.30 10/12/2015   FREET4 0.85 07/14/2015  04/25/2015: TSH 2.48 01/31/2015: TSH 5.747 (0.4-5.5) - after starting taking it at night 10/13/2014: TSH 2.619 03/08/2014: TSH 2.134 07/23/2013: TSH 3.869  He also has a history of hypertension, emphysema,  Hyperlipidemia, prostate hypertrophy, OSA, OA, fibromyalgia, heart murmur.  ROS: Constitutional: no weight gain/loss, no fatigue, no subjective hyperthermia/hypothermia, + nocturia Eyes: no blurry vision, no xerophthalmia ENT: no sore throat, no nodules palpated in throat, no dysphagia/odynophagia, no hoarseness Cardiovascular: no CP/SOB/palpitations/leg swelling Respiratory: nocough/SOB/wheezing Gastrointestinal: no N/V/D/C Musculoskeletal: no muscle/no joint aches Skin: no rashes Neurological: no tremors/numbness/tingling/dizziness  I reviewed pt's medications, allergies, PMH, social hx, family hx, and  changes were documented in the history of present illness. Otherwise, unchanged from my initial visit note.  Past Medical History:  Diagnosis Date  . Arthritis   . Cholecystitis with cholelithiasis   . Eczema   . Emphysema of lung ([place])   . Gallstones and inflammation of gallbladder without obstruction   . Heart murmur   . History of  nuclear stress test 02/23/2010   dipyridamole; normal pattern of perfusion in all regions; normal, low risk study   . Hyperlipidemia   . Hypertension   . 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 Meningoencephalitis   . Obesity   . OSA on CPAP    last sleep study >8 yrs  . Pneumonia    "twice" (05/19/2013)  . Shortness of breath   . Stroke [facility])    "they said I had a minor one when I had menigitis" (05/19/2013)  . Thyroid disease   . Tobacco abuse    quit 11/08/2012  . Type II diabetes mellitus ([place])   . Umbilical hernia    Past Surgical History:  Procedure Laterality Date  . CHOLECYSTECTOMY  09/09/2011   Procedure: LAPAROSCOPIC CHOLECYSTECTOMY WITH INTRAOPERATIVE CHOLANGIOGRAM;  Surgeon: Belva Crome, MD;  Location: [place];  Service: General;  Laterality: N/A;  . HERNIA REPAIR  04/2006   UHR  . JOINT REPLACEMENT    . KNEE ARTHROSCOPY Right    "w2 times" (05/19/2013)  . TONSILLECTOMY    . TOTAL KNEE ARTHROPLASTY Left 05/19/2013  . TOTAL KNEE ARTHROPLASTY Left 05/19/2013   Procedure: TOTAL KNEE ARTHROPLASTY;  Surgeon: Newt Minion, MD;  Location: [place];  Service: Orthopedics;  Laterality: Left;  Left Total Knee Arthroplasty  . TRANSTHORACIC ECHOCARDIOGRAM  03/28/2005   EF=>55%, mod conc LVH; LA mod dilated & borderline RA enlargement; mild TR; mild pulm valve regurg   Social History   Social History  . Marital Status: Married    Spouse Name: sandra  . Number of Children: 3  . Years of Education: tech   Occupational History  . 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 Smoking status: Current Every Day Smoker -- 0.50 packs/day for 50 years    Types: Cigarettes  . Smokeless tobacco: Never Used  . Alcohol Use: No  . Drug Use: No  . Sexual Activity: Yes   Other Topics Concern  . Not on file   Social History Narrative   Current Outpatient Prescriptions on File Prior to Visit  Medication Sig Dispense Refill  .  amLODipine (NORVASC) 10 MG tablet Take 10 mg by mouth daily.    [name] atorvastatin (LIPITOR) 20 MG tablet Reported on 01/11/2016    . Blood Glucose Monitoring Suppl (ONETOUCH VERIO FLEX SYSTEM) W/DEVICE KIT 1 each by Does not apply route daily. Dx: E11.9 1 kit 0  . Cyanocobalamin (VITAMIN B-12) 2000 MCG TBCR Take 1 tablet by mouth daily.    [name] dipyridamole-aspirin (AGGRENOX) 200-25 MG 12hr capsule Take 1 capsule by mouth 2 (two) times daily. 60 capsule 11  . glipiZIDE (GLUCOTROL XL) 10 MG 24 hr tablet TAKE 1 TABLET (10 MG TOTAL) BY MOUTH DAILY WITH BREAKFAST. 90 tablet 3  . glucose blood (ONETOUCH VERIO) test strip Use to test blood sugar 2-3 times daily as instructed. Dx: E11.9 200 each 3  . levothyroxine (SYNTHROID, LEVOTHROID) 175 MCG tablet TAKE 1 TABLET BY MOUTH  DAILY BEFORE BREAKFAST 90 tablet 2  . metFORMIN (GLUCOPHAGE-XR) 500  MG 24 hr tablet TAKE 4 TABLETS (2,000 MG TOTAL) BY MOUTH DAILY WITH DINNER 360 tablet 3  . metoprolol succinate (TOPROL-XL) 100 MG 24 hr tablet Take 100 mg by mouth daily. Take with or immediately following a meal.    . ONETOUCH DELICA LANCETS FINE MISC Use to test blood sugar 2-3 times daily as instructed. Dx: E11.9 200 each 3  . sitaGLIPtin (JANUVIA) 100 MG tablet Take 1 tablet by mouth  daily 90 tablet 3  . triamcinolone cream (KENALOG) 0.1 % Apply 1 application topically 2 (two) times daily.   1  . valsartan-hydrochlorothiazide (DIOVAN-HCT) 320-12.5 MG per tablet Take 1 tablet by mouth daily.  1  . VENTOLIN HFA 108 (90 Base) MCG/ACT inhaler     . Vitamin D, Ergocalciferol, (DRISDOL) 50000 UNITS CAPS capsule Take 50,000 Units by mouth every 7 (seven) days.   2   No current facility-administered medications on file prior to visit.    Allergies  Allergen Reactions  . Codeine Nausea And Vomiting  . Demerol Nausea And Vomiting   Family History  Problem Relation Age of Onset  . Cancer Maternal Aunt     colon  . Cancer Maternal Aunt     lung  . Thyroid disease  Mother   . Hyperlipidemia Father   . Diabetes Maternal Grandmother   . Asthma Child   . Diabetes type I Child    PE: BP (!) 148/92 (BP Location: Right Arm, Patient Position: Sitting)   Pulse (!) 58   Ht _0  (1.778 m)   Wt 283 lb (128.4 kg)   SpO2 96%   BMI 40.61 kg/m  Body mass index is 40.61 kg/m. Wt Readings from Last 3 Encounters:  07/16/16 283 lb (128.4 kg)  04/12/16 276 lb (125.2 kg)  01/11/16 278 lb (126.1 kg)   Constitutional: overweight, in NAD Eyes: PERRLA, EOMI, no exophthalmos ENT: moist mucous membranes, no thyromegaly, no cervical lymphadenopathy Cardiovascular: RRR, + 2/6 SEM, no RG Respiratory: CTA B Gastrointestinal: abdomen soft, NT, ND, BS+ Musculoskeletal: no deformities, strength intact in all 4 Skin: moist, warm, no rashes Neurological: no tremor with outstretched hands, DTR normal in all 4  ASSESSMENT: 1. 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 Hypothyroidism  PLAN:  1. Patient with long-standing, uncontrolled diabetes, on oral antidiabetic regimen, with fair control. Will not change regimen. - I suggested to:  Patient Instructions  Please continue: - Metformin ER 2000 mg daily at dinnertime - Glipizide XL 10 mg in am, before breakfast - Januvia 100 mg in am.  Please return in 3 months with your sugar log.  - continue checking sugars at different times of the day - check 2 times a day, rotating checks - advised for yearly eye exams >> he is UTD - given flu shot at last visit - checked HbA1c today >> 8.4% (higher) - Return to clinic in 3 mo with sugar log   2. Hypothyroidism - postablative - TSH normal on 175 mcg daily in 04/2016 - discussed about taking LT4 every day, with water, >30 minutes before breakfast, separated by >4 hours from acid reflux medications, calcium, iron, multivitamins. He is taking correctly. - will check TFTs  today  Patient Instructions  Please continue Levothyroxine 175 mcg daily in am.  Take the thyroid hormone every day, with water, >30 minutes before breakfast, separated by >4 hours from acid reflux medications, calcium, iron, multivitamins.  Philemon Kingdom,  MD PhD [facility] Endocrinology

## 2016-07-16 NOTE — Patient Instructions (Signed)
Please continue: - Metformin ER 2000 mg daily at dinnertime - Glipizide XL 10 mg in am, before breakfast - Januvia 100 mg in am.  Please continue Levothyroxine dose 175 mcg daily in am.  Take the thyroid hormone every day, with water, >30 minutes before breakfast, separated by >4 hours from acid reflux medications, calcium, iron, multivitamins.  Please return in 3 months with your sugar log.

## 2016-08-19 ENCOUNTER — Other Ambulatory Visit: Payer: Self-pay

## 2016-08-19 ENCOUNTER — Telehealth: Payer: Self-pay | Admitting: Internal Medicine

## 2016-08-19 MED ORDER — METFORMIN HCL ER 500 MG PO TB24: ORAL_TABLET | ORAL | 0 refills | Status: DC

## 2016-08-19 NOTE — Telephone Encounter (Signed)
Submitted

## 2016-08-19 NOTE — Telephone Encounter (Signed)
Pt needs his Met[facility] to [facility] local [facility] [place] for a one month emergency supply and then the rest of his refills sent to his [facility] mail order. He is completely out if this could be done as soon as possible.

## 2016-08-21 ENCOUNTER — Telehealth: Payer: Self-pay | Admitting: Internal Medicine

## 2016-08-21 ENCOUNTER — Other Ambulatory Visit: Payer: Self-pay

## 2016-08-21 ENCOUNTER — Telehealth: Payer: Self-pay

## 2016-08-21 MED ORDER — METFORMIN HCL ER 500 MG PO TB24: ORAL_TABLET | ORAL | 0 refills | Status: DC

## 2016-08-21 NOTE — Telephone Encounter (Signed)
Changed rx, submitted to pharmacy

## 2016-08-21 NOTE — Telephone Encounter (Signed)
rx submitted.  

## 2016-08-21 NOTE — Telephone Encounter (Signed)
[facility] in [facility] only [facility]  7 day supply of the Metformin and he needs a 30 day supply sent to [facility] [place] ASAP.

## 2016-08-21 NOTE — Telephone Encounter (Signed)
Patient need refill   of metFORMIN (GLUCOPHAGE-XR) 500 MG 24 hr tablet   [id number] [place], [place] - [address] [phone] ([phone] (Fax)

## 2016-10-15 ENCOUNTER — Ambulatory Visit (INDEPENDENT_AMBULATORY_CARE_PROVIDER_SITE_OTHER): Payer: 59 | Admitting: Internal Medicine

## 2016-10-15 ENCOUNTER — Encounter: Payer: Self-pay | Admitting: Internal Medicine

## 2016-10-15 VITALS — BP 130/80 | HR 55 | Ht 70.5 in | Wt 279.0 lb

## 2016-10-15 DIAGNOSIS — IMO0002 Reserved for concepts with insufficient information to code with codable children: Secondary | ICD-10-CM

## 2016-10-15 DIAGNOSIS — E1165 Type 2 diabetes mellitus with hyperglycemia: Secondary | ICD-10-CM | POA: Diagnosis not present

## 2016-10-15 DIAGNOSIS — E1142 Type 2 diabetes mellitus with diabetic polyneuropathy: Secondary | ICD-10-CM | POA: Diagnosis not present

## 2016-10-15 LAB — POCT GLYCOSYLATED HEMOGLOBIN (HGB A1C): Hemoglobin A1C: 9.2

## 2016-10-15 NOTE — Progress Notes (Signed)
Patient ID: Mason Patton, male   DOB: 1946-03-26, 71 y.o.   MRN: [id number]  HPI: Mason Patton is a 71 y.o.-year-old male, initially referred by his PCP, Nena Polio, NP, now returrning for f/u for DM2, dx in 2010, non-insulin-dependent, uncontrolled, with complications (retinopathy, PN, cerebrovascular disease-s/p stroke) and also hypothyroidism. Last visit 3 mo ago. Has [facility] also.  He had bronchitis >> steroids >> sugars 400s- HI. He finished the course 3 weeks ago.  DM2: Last hemoglobin A1c was: Lab Results  Component Value Date   HGBA1C 8.4 07/16/2016   HGBA1C 7.1 04/12/2016   HGBA1C 7.2 01/11/2016  04/25/2015: HbA1c 8.2% 01/31/2015: HbA1c 8.4% 10/13/2014: HbA1c 7.8% 07/28/2014: HbA1c 7.3% 07/23/2013: HbA1c 6.6%  Pt is on: - Metformin ER 2000 mg daily in am - Glipizide XL 10 mg in am, before breakfast - Januvia 100 mg in am  Pt checks his sugars 1-2x a day: - am: 137-180 >> 100-125 >> 90-100 >> 70s, 94-100 >> 98-130, 140 >> 110-130 - 2h after b'fast: n/c >> 106-123 >> n/c - before lunch: n/c >> 64-157, 172 >> n/c - 2h after lunch: n/c >> 139-180 >> 139-150 >> n/c - before dinner: 90-160 (snack) >> 120-130s before the Holidays, 180-200 after the Holidays >> 130-140 - 2h after dinner: n/c >> 117-165 >> 93 >> n/c >> 200 >> n/c - bedtime: n/c >> 135 >> n/c - nighttime: n/c No lows. Lowest sugar was 70s >> 98; he has hypoglycemia awareness at 70.  Highest sugar was 180 >> 200s >> HI (steroids).  Glucometer: One Touch Verio  Pt's meals are: - Breakfast: skip or biscuit, eggs, toast, grits - Lunch: fast food - Dinner: meat + potatoes + salads; beans - Snacks: rarely; fruit   - no CKD, last BUN/creatinine:  Lab Results  Component Value Date   BUN 17 04/12/2016   CREATININE 0.77 04/12/2016  04/25/2015: BUN/Cr 9/0.7 01/31/2015: Glucose 158, BUN/creatinine 11/0.69, EGFR>60, LFTs normal ACR 75 On Diovan. - last set of lipids:  Lab Results  Component Value Date    CHOL 106 04/12/2016   HDL 27.50 (L) 04/12/2016   LDLCALC 57 04/12/2016   TRIG 109.0 04/12/2016   CHOLHDL 4 04/12/2016  04/25/2015: 87/125/27/35 01/31/2015: 113/110/32/74 10/13/2014: 117/116/30/78 on Lipitor 40 mg daily - last eye exam was in 12/2015 ([place]). + DR.  - no numbness and tingling in his feet. + mild burning.   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 - he takes the levothyroxine:  Fasting  In a.m.   Separated by >30 min from breakfast  No PPI, iron, calcium, multivitamin  Reviewed TFTs: Lab Results  Component Value Date   TSH 2.58 04/12/2016   TSH 2.40 10/12/2015   TSH 18.09 (H) 07/14/2015   TSH 0.105 (L) 03/23/2013   FREET4 1.11 04/12/2016   FREET4 1.30 10/12/2015   FREET4 0.85 07/14/2015  04/25/2015: TSH 2.48 01/31/2015: TSH 5.747 (0.4-5.5) - after starting taking it at night 10/13/2014: TSH 2.619 03/08/2014: TSH 2.134 07/23/2013: TSH 3.869  He also has a history of hypertension, emphysema,  Hyperlipidemia, prostate hypertrophy, OSA, OA, fibromyalgia, heart murmur.  ROS: Constitutional: no weight gain/loss, no fatigue, no subjective hyperthermia/hypothermia, + nocturia Eyes: no blurry vision, no xerophthalmia ENT: no sore throat, no nodules palpated in throat, no dysphagia/odynophagia, no hoarseness Cardiovascular: no CP/SOB/palpitations/leg swelling Respiratory: nocough/SOB/wheezing Gastrointestinal: no N/V/D/C Musculoskeletal: no muscle/no joint aches Skin: no rashes Neurological:  no tremors/numbness/tingling/dizziness  I reviewed pt's medications, allergies, PMH, social hx, family hx, and changes were documented in the history of present illness. Otherwise, unchanged from my initial visit note.  Past Medical History:  Diagnosis Date  . Arthritis   . Cholecystitis with cholelithiasis   . Eczema   . Emphysema of lung ([place])   . Gallstones and inflammation of  gallbladder without obstruction   . Heart murmur   . History of nuclear stress test 02/23/2010   dipyridamole; normal pattern of perfusion in all regions; normal, low risk study   . Hyperlipidemia   . Hypertension   . 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 Meningoencephalitis   . Obesity   . OSA on CPAP    last sleep study >8 yrs  . Pneumonia    "twice" (05/19/2013)  . Shortness of breath   . Stroke [facility])    "they said I had a minor one when I had menigitis" (05/19/2013)  . Thyroid disease   . Tobacco abuse    quit 11/08/2012  . Type II diabetes mellitus ([place])   . Umbilical hernia    Past Surgical History:  Procedure Laterality Date  . CHOLECYSTECTOMY  09/09/2011   Procedure: LAPAROSCOPIC CHOLECYSTECTOMY WITH INTRAOPERATIVE CHOLANGIOGRAM;  Surgeon: Belva Crome, MD;  Location: [place];  Service: General;  Laterality: N/A;  . HERNIA REPAIR  04/2006   UHR  . JOINT REPLACEMENT    . KNEE ARTHROSCOPY Right    "w2 times" (05/19/2013)  . TONSILLECTOMY    . TOTAL KNEE ARTHROPLASTY Left 05/19/2013  . TOTAL KNEE ARTHROPLASTY Left 05/19/2013   Procedure: TOTAL KNEE ARTHROPLASTY;  Surgeon: Newt Minion, MD;  Location: [place];  Service: Orthopedics;  Laterality: Left;  Left Total Knee Arthroplasty  . TRANSTHORACIC ECHOCARDIOGRAM  03/28/2005   EF=>55%, mod conc LVH; LA mod dilated & borderline RA enlargement; mild TR; mild pulm valve regurg   Social History   Social History  . Marital Status: Married    Spouse Name: sandra  . Number of Children: 3  . Years of Education: tech   Occupational History  . 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 Smoking status: Current Every Day Smoker -- 0.50 packs/day for 50 years    Types: Cigarettes  . Smokeless tobacco: Never Used  . Alcohol Use: No  . Drug Use: No   Current Outpatient Prescriptions on File Prior to Visit  Medication Sig Dispense Refill  . amLODipine (NORVASC) 10 MG tablet  Take 10 mg by mouth daily.    [name] atorvastatin (LIPITOR) 20 MG tablet Reported on 01/11/2016    . Blood Glucose Monitoring Suppl (ONETOUCH VERIO FLEX SYSTEM) W/DEVICE KIT 1 each by Does not apply route daily. Dx: E11.9 1 kit 0  . Cyanocobalamin (VITAMIN B-12) 2000 MCG TBCR Take 1 tablet by mouth daily.    [name] dipyridamole-aspirin (AGGRENOX) 200-25 MG 12hr capsule Take 1 capsule by mouth 2 (two) times daily. 60 capsule 11  . glipiZIDE (GLUCOTROL XL) 10 MG 24 hr tablet TAKE 1 TABLET (10 MG TOTAL) BY MOUTH DAILY WITH BREAKFAST. 90 tablet 3  . glucose blood (ONETOUCH VERIO) test strip Use to test blood sugar 2-3 times daily as instructed. Dx: E11.9 200 each 3  . levothyroxine (SYNTHROID, LEVOTHROID) 175 MCG tablet TAKE 1 TABLET BY MOUTH  DAILY BEFORE BREAKFAST 90 tablet 2  . metFORMIN (GLUCOPHAGE-XR) 500 MG 24 hr tablet TAKE 4  TABLETS (2,000 MG TOTAL) BY MOUTH DAILY WITH DINNER 360 tablet 0  . metoprolol succinate (TOPROL-XL) 100 MG 24 hr tablet Take 100 mg by mouth daily. Take with or immediately following a meal.    . ONETOUCH DELICA LANCETS FINE MISC Use to test blood sugar 2-3 times daily as instructed. Dx: E11.9 200 each 3  . sitaGLIPtin (JANUVIA) 100 MG tablet Take 1 tablet by mouth  daily 90 tablet 3  . triamcinolone cream (KENALOG) 0.1 % Apply 1 application topically 2 (two) times daily.   1  . valsartan-hydrochlorothiazide (DIOVAN-HCT) 320-12.5 MG per tablet Take 1 tablet by mouth daily.  1  . VENTOLIN HFA 108 (90 Base) MCG/ACT inhaler     . Vitamin D, Ergocalciferol, (DRISDOL) 50000 UNITS CAPS capsule Take 50,000 Units by mouth every 7 (seven) days.   2   No current facility-administered medications on file prior to visit.    Allergies  Allergen Reactions  . Codeine Nausea And Vomiting  . Demerol Nausea And Vomiting   Family History  Problem Relation Age of Onset  . Cancer Maternal Aunt     colon  . Cancer Maternal Aunt     lung  . Thyroid disease Mother   . Hyperlipidemia Father   .  Diabetes Maternal Grandmother   . Asthma Child   . Diabetes type I Child    PE: BP 130/80 (BP Location: Left Arm, Patient Position: Sitting)   Pulse (!) 55   Ht 5' 10.5" (1.791 m)   Wt 279 lb (126.6 kg)   SpO2 95%   BMI 39.47 kg/m  Body mass index is 39.47 kg/m. Wt Readings from Last 3 Encounters:  10/15/16 279 lb (126.6 kg)  07/16/16 283 lb (128.4 kg)  04/12/16 276 lb (125.2 kg)   Constitutional: overweight, in NAD Eyes: PERRLA, EOMI, no exophthalmos ENT: moist mucous membranes, no thyromegaly, no cervical lymphadenopathy Cardiovascular: RRR, + 2/6 high pitched SEM, no RG Respiratory: CTA B Gastrointestinal: abdomen soft, NT, ND, BS+ Musculoskeletal: no deformities, strength intact in all 4 Skin: moist, warm, no rashes Neurological: no tremor with outstretched hands, DTR normal in all 4  ASSESSMENT: 1. 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 Hypothyroidism  PLAN:  1. Patient with long-standing, uncontrolled diabetes, on oral antidiabetic regimen, with history of fair control, however, now worsening. His sugars were very high during a steroid course 3 weeks ago. However, he is now getting sugars at or close to target in the last 3 weeks, after he stopped the steroids. We checked HbA1c today >> 9.2% (higher), however, this reflects the last 3 months. Since he did not have steroids in the last 3 weeks, I would also check a fructosamine level. It is difficult to adjust the regimen if the sugars that he gets at home are at or close to target. - I suggested to:  Patient Instructions  Please continue: - Metformin ER 2000 mg daily at dinnertime - Glipizide XL 10 mg in am, before breakfast - Januvia 100 mg in am.  Check sugars 1-2x a day, rotating check times and bring the log at next visit.  Please stop at the lab.  Please return in 3 months with your sugar log.  -  continue checking sugars at different times of the day - check 1-2 times a day, rotating checks. I gave him sugar logs again strongly advised him to fill them out to bring them at  next visit. - advised for yearly eye exams >> he is UTD - given flu shot This season - Return to clinic in 3 mo with sugar log   2. Hypothyroidism - postablative - TSH normal on 175 mcg daily in 04/2016 - We again discussed about taking LT4 every day, with water, >30 minutes before breakfast, separated by >4 hours from acid reflux medications, calcium, iron, multivitamins. He is taking correctly. - will check TFTs today  Patient Instructions  Please continue Levothyroxine 175 mcg daily in am.  Take the thyroid hormone every day, with water, >30 minutes before breakfast, separated by >4 hours from acid reflux medications, calcium, iron, multivitamins.  HbA1c calculated from fructosamine is vastly different than measured: 6.76! From now on, he may need fructosamine measurements.  Philemon Kingdom, MD PhD [facility] Endocrinology

## 2016-10-15 NOTE — Patient Instructions (Addendum)
Please continue: - Metformin ER 2000 mg daily at dinnertime - Glipizide XL 10 mg in am, before breakfast - Januvia 100 mg in am.  Check sugars 1-2x a day, rotating check times and bring the log at next visit.  Please stop at the lab.  Please continue Levothyroxine dose 175 mcg daily in am.  Take the thyroid hormone every day, with water, >30 minutes before breakfast, separated by >4 hours from acid reflux medications, calcium, iron, multivitamins.  Please return in 3 months with your sugar log.

## 2016-10-17 LAB — FRUCTOSAMINE: FRUCTOSAMINE: 303 umol/L — AB (ref 190–270)

## 2016-10-18 ENCOUNTER — Telehealth: Payer: Self-pay

## 2016-10-18 ENCOUNTER — Ambulatory Visit: Payer: Medicare Other | Admitting: Internal Medicine

## 2016-10-18 NOTE — Telephone Encounter (Signed)
Called patient and gave lab results. Patient had no questions or concerns.  

## 2016-10-18 NOTE — Telephone Encounter (Signed)
-----   Message from Mason Pavlov, MD sent at 10/18/2016 10:20 AM EDT ----- [name], can you please call pt: HbA1c calculated from fructosamine is vastly different than measured: 6.76 [facility] better)!

## 2016-11-27 ENCOUNTER — Telehealth: Payer: Self-pay | Admitting: Family Medicine

## 2016-11-27 MED ORDER — METFORMIN HCL ER 500 MG PO TB24: ORAL_TABLET | ORAL | 0 refills | Status: DC

## 2016-11-27 NOTE — Telephone Encounter (Signed)
Patient is calling for a new script for  metFORMIN (GLUCOPHAGE-XR) 500 MG 24 hr tablet TAKE 4 TABLETS (2,000 MG TOTAL) BY MOUTH DAILY WITH DINNER.  Say's it's ok to talk to his wife if he is unavailable.  Use [facility] [place], [place] - [address].   Thank you,  -LL

## 2016-11-27 NOTE — Telephone Encounter (Signed)
Refill submitted. 

## 2016-11-29 ENCOUNTER — Encounter: Payer: Self-pay | Admitting: Gastroenterology

## 2016-12-01 ENCOUNTER — Other Ambulatory Visit: Payer: Self-pay | Admitting: Internal Medicine

## 2016-12-28 ENCOUNTER — Other Ambulatory Visit: Payer: Self-pay | Admitting: Internal Medicine

## 2017-02-03 ENCOUNTER — Ambulatory Visit (INDEPENDENT_AMBULATORY_CARE_PROVIDER_SITE_OTHER): Payer: 59 | Admitting: Internal Medicine

## 2017-02-03 ENCOUNTER — Encounter: Payer: Self-pay | Admitting: Internal Medicine

## 2017-02-03 VITALS — BP 142/82 | HR 57 | Ht 70.5 in | Wt 277.0 lb

## 2017-02-03 DIAGNOSIS — IMO0002 Reserved for concepts with insufficient information to code with codable children: Secondary | ICD-10-CM

## 2017-02-03 DIAGNOSIS — E1165 Type 2 diabetes mellitus with hyperglycemia: Secondary | ICD-10-CM

## 2017-02-03 DIAGNOSIS — E1142 Type 2 diabetes mellitus with diabetic polyneuropathy: Secondary | ICD-10-CM

## 2017-02-03 DIAGNOSIS — R809 Proteinuria, unspecified: Secondary | ICD-10-CM

## 2017-02-03 DIAGNOSIS — E890 Postprocedural hypothyroidism: Secondary | ICD-10-CM

## 2017-02-03 DIAGNOSIS — E1129 Type 2 diabetes mellitus with other diabetic kidney complication: Secondary | ICD-10-CM

## 2017-02-03 LAB — MICROALBUMIN / CREATININE URINE RATIO
Creatinine,U: 372.8 mg/dL
MICROALB UR: 17.7 mg/dL — AB (ref 0.0–1.9)
MICROALB/CREAT RATIO: 4.7 mg/g (ref 0.0–30.0)

## 2017-02-03 LAB — TSH: TSH: 7.19 u[IU]/mL — ABNORMAL HIGH (ref 0.35–4.50)

## 2017-02-03 LAB — POCT GLYCOSYLATED HEMOGLOBIN (HGB A1C): Hemoglobin A1C: 8.3

## 2017-02-03 LAB — T4, FREE: Free T4: 0.96 ng/dL (ref 0.60–1.60)

## 2017-02-03 MED ORDER — METFORMIN HCL ER 500 MG PO TB24: ORAL_TABLET | ORAL | 3 refills | Status: DC

## 2017-02-03 NOTE — Progress Notes (Signed)
Patient ID: Mason Patton, male   DOB: 03/30/1946, 71 y.o.   MRN: [id number]  HPI: Mason Patton is a 71 y.o.-year-old male, initially referred by his PCP, Nena Polio, NP, now returrning for f/u for DM2, dx in 2010, non-insulin-dependent, uncontrolled, with complications (retinopathy, PN, cerebrovascular disease-s/p stroke) and also hypothyroidism. Last visit 3.5 mo ago. Has [facility] also.  DM2: Last hemoglobin A1c was: 10/15/2016: HbA1c calculated from fructosamine: 6.76% Lab Results  Component Value Date   HGBA1C 9.2 10/15/2016   HGBA1C 8.4 07/16/2016   HGBA1C 7.1 04/12/2016  04/25/2015: HbA1c 8.2% 01/31/2015: HbA1c 8.4% 10/13/2014: HbA1c 7.8% 07/28/2014: HbA1c 7.3% 07/23/2013: HbA1c 6.6%  Pt is on: - Metformin ER 2000 mg daily in am as he forgets at night - Glipizide XL 10 mg in am, before breakfast - Januvia 100 mg in am  Pt checks his sugars 1-2 day: - am:  90-100 >> 70s, 94-100 >> 98-130, 140 >> 110-130 >> 100-140 - 2h after b'fast: n/c >> 106-123 >> n/c - before lunch: n/c >> 64-157, 172 >> n/c - 2h after lunch: n/c >> 139-180 >> 139-150 >> n/c - before dinner:  120-130s before the Holidays>> 130-140 >> 100-150s, 180, 200s - 2h after dinner: n/c >> 117-165 >> 93 >> n/c >> 200 >> n/c - bedtime: n/c >> 135 >> n/c - nighttime: n/c No lows. Lowest sugar was 70s >> 98 >> 100; he has hypoglycemia awareness at 70s.  Highest sugar was 180 >> 200s >> HI (steroids) >> 200s (1h after dinner)  Glucometer: One Touch Verio  Pt's meals are: - Breakfast: skip or biscuit, eggs, toast, grits - Lunch: fast food - Dinner: meat + potatoes + salads; beans - Snacks: rarely; fruit   - No CKD, last BUN/creatinine:  11/22/2016: 12/0.74 Lab Results  Component Value Date   BUN 17 04/12/2016   CREATININE 0.77 04/12/2016  04/25/2015: BUN/Cr 9/0.7 He has a history of microalbuminuria: 10/13/2014: ACR 147 07/28/2014: ACR 63 On Diovan. - last set of lipids: 11/22/2016:  110/112/28/69 Lab Results  Component Value Date   CHOL 106 04/12/2016   HDL 27.50 (L) 04/12/2016   LDLCALC 57 04/12/2016   TRIG 109.0 04/12/2016   CHOLHDL 4 04/12/2016  04/25/2015: 87/125/27/35 + Lipitor 40 mg daily. - last eye exam was in 12/2016 ([place]). + DR. Need records. 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 - he takes the levothyroxine: - in am - fasting - at least 30 min from b'fast - no Ca, Fe, MVI, PPIs - not on Biotin  Reviewed TFTs: Lab Results  Component Value Date   TSH 2.58 04/12/2016   TSH 2.40 10/12/2015   TSH 18.09 (H) 07/14/2015   TSH 0.105 (L) 03/23/2013   FREET4 1.11 04/12/2016   FREET4 1.30 10/12/2015   FREET4 0.85 07/14/2015  04/25/2015: TSH 2.48 01/31/2015: TSH 5.747 (0.4-5.5) - after starting taking it at night 10/13/2014: TSH 2.619 03/08/2014: TSH 2.134 07/23/2013: TSH 3.869  He also has a history of hypertension, emphysema,  Hyperlipidemia, prostate hypertrophy, OSA, OA, fibromyalgia, heart murmur.  ROS: Constitutional: no weight gain/no weight loss, + fatigue, no subjective hyperthermia, no subjective hypothermia, + nocturia Eyes: no blurry vision, no xerophthalmia ENT: no sore throat, no nodules palpated in throat, no dysphagia, no odynophagia, no hoarseness Cardiovascular: no CP/no SOB/no palpitations/no leg swelling Respiratory: no  cough/no SOB/no wheezing Gastrointestinal: no N/no V/no D/no C/no acid reflux Musculoskeletal: no muscle aches/no joint aches Skin: no rashes, no hair loss Neurological: no tremors/no numbness/no tingling/no dizziness + diff with erections  I reviewed pt's medications, allergies, PMH, social hx, family hx, and changes were documented in the history of present illness. Otherwise, unchanged from my initial visit note.  Past Medical History:  Diagnosis Date   . Arthritis   . Cholecystitis with cholelithiasis   . Eczema   . Emphysema of lung ([place])   . Gallstones and inflammation of gallbladder without obstruction   . Heart murmur   . History of nuclear stress test 02/23/2010   dipyridamole; normal pattern of perfusion in all regions; normal, low risk study   . Hyperlipidemia   . Hypertension   . 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 Meningoencephalitis   . Obesity   . OSA on CPAP    last sleep study >8 yrs  . Pneumonia    "twice" (05/19/2013)  . Shortness of breath   . Stroke [facility])    "they said I had a minor one when I had menigitis" (05/19/2013)  . Thyroid disease   . Tobacco abuse    quit 11/08/2012  . Type II diabetes mellitus ([place])   . Umbilical hernia    Past Surgical History:  Procedure Laterality Date  . CHOLECYSTECTOMY  09/09/2011   Procedure: LAPAROSCOPIC CHOLECYSTECTOMY WITH INTRAOPERATIVE CHOLANGIOGRAM;  Surgeon: Belva Crome, MD;  Location: [place];  Service: General;  Laterality: N/A;  . HERNIA REPAIR  04/2006   UHR  . JOINT REPLACEMENT    . KNEE ARTHROSCOPY Right    "w2 times" (05/19/2013)  . TONSILLECTOMY    . TOTAL KNEE ARTHROPLASTY Left 05/19/2013  . TOTAL KNEE ARTHROPLASTY Left 05/19/2013   Procedure: TOTAL KNEE ARTHROPLASTY;  Surgeon: Newt Minion, MD;  Location: [place];  Service: Orthopedics;  Laterality: Left;  Left Total Knee Arthroplasty  . TRANSTHORACIC ECHOCARDIOGRAM  03/28/2005   EF=>55%, mod conc LVH; LA mod dilated & borderline RA enlargement; mild TR; mild pulm valve regurg   Social History   Social History  . Marital Status: Married    Spouse Name: sandra  . Number of Children: 3  . Years of Education: tech   Occupational History  . 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 Smoking status: Current Every Day Smoker -- 0.50 packs/day for 50 years    Types: Cigarettes  . Smokeless tobacco: Never Used  . Alcohol Use: No  . Drug Use: No    Current Outpatient Prescriptions on File Prior to Visit  Medication Sig Dispense Refill  . amLODipine (NORVASC) 10 MG tablet Take 10 mg by mouth daily.    [name] atorvastatin (LIPITOR) 20 MG tablet Reported on 01/11/2016    . Blood Glucose Monitoring Suppl (ONETOUCH VERIO FLEX SYSTEM) W/DEVICE KIT 1 each by Does not apply route daily. Dx: E11.9 1 kit 0  . Cyanocobalamin (VITAMIN B-12) 2000 MCG TBCR Take 1 tablet by mouth daily.    [name] dipyridamole-aspirin (AGGRENOX) 200-25 MG 12hr capsule Take 1 capsule by mouth 2 (two) times daily. 60 capsule 11  . glipiZIDE (GLUCOTROL XL) 10 MG 24 hr tablet TAKE 1 TABLET BY MOUTH  DAILY WITH BREAKFAST 90 tablet 1  . glucose blood (ONETOUCH VERIO) test strip Use to test blood sugar 2-3 times daily as instructed. Dx: E11.9 200 each 3  .  JANUVIA 100 MG tablet TAKE 1 TABLET BY MOUTH  DAILY 90 tablet 1  . levothyroxine (SYNTHROID, LEVOTHROID) 175 MCG tablet TAKE 1 TABLET BY MOUTH  DAILY BEFORE BREAKFAST 90 tablet 2  . metFORMIN (GLUCOPHAGE-XR) 500 MG 24 hr tablet TAKE 4 TABLETS BY MOUTH  DAILY WITH DINNER 360 tablet 1  . metoprolol succinate (TOPROL-XL) 100 MG 24 hr tablet Take 100 mg by mouth daily. Take with or immediately following a meal.    . ONETOUCH DELICA LANCETS FINE MISC Use to test blood sugar 2-3 times daily as instructed. Dx: E11.9 200 each 3  . triamcinolone cream (KENALOG) 0.1 % Apply 1 application topically 2 (two) times daily.   1  . valsartan-hydrochlorothiazide (DIOVAN-HCT) 320-12.5 MG per tablet Take 1 tablet by mouth daily.  1  . VENTOLIN HFA 108 (90 Base) MCG/ACT inhaler     . Vitamin D, Ergocalciferol, (DRISDOL) 50000 UNITS CAPS capsule Take 50,000 Units by mouth every 7 (seven) days.   2   No current facility-administered medications on file prior to visit.    Allergies  Allergen Reactions  . Codeine Nausea And Vomiting  . Demerol Nausea And Vomiting   Family History  Problem Relation Age of Onset  . Cancer Maternal Aunt        colon  .  Cancer Maternal Aunt        lung  . Thyroid disease Mother   . Hyperlipidemia Father   . Diabetes Maternal Grandmother   . Asthma Child   . Diabetes type I Child    PE: BP (!) 142/82 (BP Location: Left Arm, Patient Position: Sitting)   Pulse (!) 57   Ht 5' 10.5" (1.791 m)   Wt 277 lb (125.6 kg)   SpO2 96%   BMI 39.18 kg/m  Body mass index is 39.18 kg/m. Wt Readings from Last 3 Encounters:  02/03/17 277 lb (125.6 kg)  10/15/16 279 lb (126.6 kg)  07/16/16 283 lb (128.4 kg)   Constitutional: overweight, in NAD Eyes: PERRLA, EOMI, no exophthalmos ENT: moist mucous membranes, no thyromegaly, no cervical lymphadenopathy Cardiovascular: RRR, + 2/6 high pitched SEM, no RG Respiratory: CTA B Gastrointestinal: abdomen soft, NT, ND, BS+ Musculoskeletal: no deformities, strength intact in all 4 Skin: moist, warm, no rashes Neurological: no tremor with outstretched hands, DTR normal in all 4  ASSESSMENT: 1. 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 Hypothyroidism  PLAN:  1. Patient with long-standing, Uncontrolled diabetes, on oral antidiabetic regimen, with history of fair controlled, worsening at last visit after having a steroid course, and still with slightly high sugars at this visit, but improved  - For now, will continue the current regimen, but since he has some high sugars after dinner, I advised him to take half of the glipizide tablet before a particularly large dinner. - last HbA1c calculated from fructosamine: 6.76% (much better than directly checked) - I suggested to:  Patient Instructions  Please continue: - Metformin ER 2000 mg daily at dinnertime - Glipizide XL 10 mg in am, before breakfast - Januvia 100 mg in am.  If you have a particularly large dinner, please cut the Glipizide in half and take half a tab before dinner.  Please stop at the  lab.  Please return in 3-4 months with your sugar log.  - today, HbA1c is 8.3% (better)>> Fructosamine pending  - continue checking sugars at different times of the day -  check 1x a day, rotating checks - advised for yearly eye exams >> he needs one - Will recheck his microalbumin/creatinine ratio today, since he had a high ratio in the past per review of records dating back to 2016.  he does continue on an ARB.  - Return to clinic in 3-4 mo with sugar log    2. Hypothyroidism - postablative - latest thyroid labs reviewed with pt >> normal  - he continues on LT4 175 mcg daily - pt feels good on this dose. - we discussed about taking the thyroid hormone every day, with water, >30 minutes before breakfast, separated by >4 hours from acid reflux medications, calcium, iron, multivitamins. Pt. is taking it correctly - will check thyroid tests today: TSH and fT4 - If labs are abnormal, he will need to return for repeat TFTs in 1.5 months - OTW, RTC in 3-4 mo  Needs refills LT4.  Component     Latest Ref Rng & Units 02/03/2017  Microalb, Ur     0.0 - 1.9 mg/dL 17.7 (H)  Creatinine,U     mg/dL 372.8  MICROALB/CREAT RATIO     0.0 - 30.0 mg/g 4.7  TSH     0.35 - 4.50 uIU/mL 7.19 (H)  Hemoglobin A1C      8.3  T4,Free(Direct)     0.60 - 1.60 ng/dL 0.96  Fructosamine     190 - 270 umol/L 308 (H)   TSH is slightly high, but he is on a rather high dose of levothyroxine, on which she had normal TFTs in the past. I would suggest to continue on the current dose and will recheck his levels at next visit. His urinary microalbumin to creatinine ratio is normal. His hemoglobin A1c is high, at 8.3, however, his HbA1c calculated from fructosamine is better, at 6.84%.  Philemon Kingdom, MD PhD [facility] Endocrinology

## 2017-02-03 NOTE — Patient Instructions (Addendum)
Please continue: - Metformin ER 2000 mg daily at dinnertime - Glipizide XL 10 mg in am, before breakfast - Januvia 100 mg in am.  If you have a particularly large dinner, please cut the Glipizide in half and take half a tab before dinner.  Please stop at the lab.  Please return in 3-4 months with your sugar log.

## 2017-02-05 LAB — FRUCTOSAMINE: FRUCTOSAMINE: 308 umol/L — AB (ref 190–270)

## 2017-02-10 MED ORDER — LEVOTHYROXINE SODIUM 175 MCG PO TABS: ORAL_TABLET | ORAL | 3 refills | Status: DC

## 2017-02-11 ENCOUNTER — Telehealth: Payer: Self-pay

## 2017-02-11 NOTE — Telephone Encounter (Signed)
-----   Message from Carlus Pavlov, MD sent at 02/10/2017  5:52 PM EDT ----- [name], can you please call pt:  TSH is slightly high, but he is on a rather high dose of levothyroxine, on which he had normal TFTs in the past. I would suggest to continue on the current dose and will recheck his levels at next visit. I sent a new refill to his pharmacy. His urinary proteins are normal. His HbA1c calculated from fructosamine is very good, at 6.84%.

## 2017-02-11 NOTE — Telephone Encounter (Signed)
LVM, gave lab results. Gave call back number if any questions or concerns.  

## 2017-04-15 ENCOUNTER — Encounter: Payer: Self-pay | Admitting: Internal Medicine

## 2017-04-15 ENCOUNTER — Ambulatory Visit (INDEPENDENT_AMBULATORY_CARE_PROVIDER_SITE_OTHER): Payer: 59 | Admitting: Internal Medicine

## 2017-04-15 VITALS — BP 134/78 | HR 53 | Wt 281.0 lb

## 2017-04-15 DIAGNOSIS — E1165 Type 2 diabetes mellitus with hyperglycemia: Secondary | ICD-10-CM

## 2017-04-15 DIAGNOSIS — E890 Postprocedural hypothyroidism: Secondary | ICD-10-CM | POA: Diagnosis not present

## 2017-04-15 DIAGNOSIS — IMO0002 Reserved for concepts with insufficient information to code with codable children: Secondary | ICD-10-CM

## 2017-04-15 DIAGNOSIS — E1142 Type 2 diabetes mellitus with diabetic polyneuropathy: Secondary | ICD-10-CM | POA: Diagnosis not present

## 2017-04-15 LAB — TSH: TSH: 8.93 u[IU]/mL — ABNORMAL HIGH (ref 0.35–4.50)

## 2017-04-15 LAB — T4, FREE: Free T4: 1.01 ng/dL (ref 0.60–1.60)

## 2017-04-15 NOTE — Patient Instructions (Addendum)
Please move: - Metformin ER 2000 mg daily to dinnertime  Please continue: - Glipizide XL 10 mg in am, before breakfast - Januvia 100 mg in am.  If you have a particularly large dinner, please cut the Glipizide in half and take half a tab before dinner.  Please continue Levothyroxine 175 mcg.  Take the thyroid hormone every day, with water, at least 30 minutes before breakfast, separated by at least 4 hours from: - acid reflux medications - calcium - iron - multivitamins  Please stop at the lab.  Please return in 3 months with your sugar log.

## 2017-04-15 NOTE — Progress Notes (Signed)
Patient ID: Mason Patton, male   DOB: [birth year], 71 y.o.   MRN: [id number]  HPI: Mason Patton is a 71 y.o.-year-old male, initially referred by his PCP, Mason Polio, NP, now returrning for f/u for DM2, dx in 2010, non-insulin-dependent, uncontrolled, with complications (retinopathy, PN, cerebrovascular disease-s/p stroke) and also hypothyroidism. Last visit 2.5 mo ago. Has UH also.  DM2: Last hemoglobin A1c was: 02/03/2017: HbA1c calculated from fructosamine: 6.84%. 10/15/2016: HbA1c calculated from fructosamine: 6.76% Lab Results  Component Value Date   HGBA1C 8.3 02/03/2017   HGBA1C 9.2 10/15/2016   HGBA1C 8.4 07/16/2016  04/25/2015: HbA1c 8.2% 01/31/2015: HbA1c 8.4% 10/13/2014: HbA1c 7.8% 07/28/2014: HbA1c 7.3% 07/23/2013: HbA1c 6.6%  Pt is on: - Metformin ER 2000 mg daily in am as he was forgetting this at night - Glipizide XL 10 mg in am, before breakfast - Januvia 100 mg in am  Pt checks his sugars 0-1x a day >> higher  - no log, no meter: - am: 98-130, 140 >> 110-130 >> 100-140 >> 140-180 - 2h after b'fast: n/c >> 106-123 >> n/c - before lunch: n/c >> 64-157, 172 >> n/c - 2h after lunch: n/c >> 139-180 >> 139-150 >> n/c - before dinner: 130-140 >> 100-150s, 180, 200s >> 140-180 - 2h after dinner:117-165 >> 93 >> n/c >> 200 >> n/c - bedtime: n/c >> 135 >> n/c - nighttime: n/c Lowest sugar was 100 >> 140; he has hypoglycemia awareness at 70s.  Highest sugar was 200s (1h after dinner) >> 200  Glucometer: One Touch Verio  Pt's meals are: - Breakfast: skip or biscuit, eggs, toast, grits - Lunch: fast food - Dinner: meat + potatoes + salads; beans - Snacks: rarely; fruit   - No CKD, last BUN/creatinine:  11/22/2016: 12/0.74 Lab Results  Component Value Date   BUN 17 04/12/2016   CREATININE 0.77 04/12/2016  04/25/2015: BUN/Cr 9/0.7  He has a history of microalbuminuria: 10/13/2014: ACR 147 07/28/2014: ACR 63 On Diovan.  - last set of  lipids: 11/22/2016: 110/112/28/69 Lab Results  Component Value Date   CHOL 106 04/12/2016   HDL 27.50 (L) 04/12/2016   LDLCALC 57 04/12/2016   TRIG 109.0 04/12/2016   CHOLHDL 4 04/12/2016  04/25/2015: 87/125/27/35 On Lipitor 40.  - last eye exam was in 12/2016 >> + DR ([place]).  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  Pt is on levothyroxine 175 mcg daily, taken: - in am - fasting - at least 30 min from b'fast - no Ca, Fe, MVI, PPIs - not on Biotin  Reviewed TFTs - last TSH higher, but we did not change his LT4 dose: Lab Results  Component Value Date   TSH 7.19 (H) 02/03/2017   TSH 2.58 04/12/2016   TSH 2.40 10/12/2015   TSH 18.09 (H) 07/14/2015   TSH 0.105 (L) 03/23/2013   FREET4 0.96 02/03/2017   FREET4 1.11 04/12/2016   FREET4 1.30 10/12/2015   FREET4 0.85 07/14/2015  04/25/2015: TSH 2.48 01/31/2015: TSH 5.747 (0.4-5.5) - after starting taking it at night 10/13/2014: TSH 2.619 03/08/2014: TSH 2.134 07/23/2013: TSH 3.869  He also has a history of hypertension, emphysema,  Hyperlipidemia, prostate hypertrophy, OSA, OA, fibromyalgia, heart murmur.  ROS: Constitutional: no weight gain/no weight loss, no fatigue, no subjective hyperthermia, no subjective hypothermia Eyes: no blurry vision, no xerophthalmia ENT: no sore throat, no nodules palpated in throat, no dysphagia, no  odynophagia, no hoarseness Cardiovascular: no CP/no SOB/no palpitations/no leg swelling Respiratory: no cough/no SOB/no wheezing Gastrointestinal: no N/no V/no D/no C/no acid reflux Musculoskeletal: no muscle aches/no joint aches Skin: no rashes, no hair loss Neurological: no tremors/no numbness/no tingling/no dizziness  I reviewed pt's medications, allergies, PMH, social hx, family hx, and changes were documented in the history of present illness. Otherwise, unchanged from my initial visit note.  Past  Medical History:  Diagnosis Date  . Arthritis   . Cholecystitis with cholelithiasis   . Eczema   . Emphysema of lung ([place])   . Gallstones and inflammation of gallbladder without obstruction   . Heart murmur   . History of nuclear stress test 02/23/2010   dipyridamole; normal pattern of perfusion in all regions; normal, low risk study   . Hyperlipidemia   . Hypertension   . 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 Meningoencephalitis   . Obesity   . OSA on CPAP    last sleep study >8 yrs  . Pneumonia    "twice" (05/19/2013)  . Shortness of breath   . Stroke [facility])    "they said I had a minor one when I had menigitis" (05/19/2013)  . Thyroid disease   . Tobacco abuse    quit 11/08/2012  . Type II diabetes mellitus ([place])   . Umbilical hernia    Past Surgical History:  Procedure Laterality Date  . CHOLECYSTECTOMY  09/09/2011   Procedure: LAPAROSCOPIC CHOLECYSTECTOMY WITH INTRAOPERATIVE CHOLANGIOGRAM;  Surgeon: Belva Crome, MD;  Location: [place];  Service: General;  Laterality: N/A;  . HERNIA REPAIR  04/2006   UHR  . JOINT REPLACEMENT    . KNEE ARTHROSCOPY Right    "w2 times" (05/19/2013)  . TONSILLECTOMY    . TOTAL KNEE ARTHROPLASTY Left 05/19/2013  . TOTAL KNEE ARTHROPLASTY Left 05/19/2013   Procedure: TOTAL KNEE ARTHROPLASTY;  Surgeon: Newt Minion, MD;  Location: [place];  Service: Orthopedics;  Laterality: Left;  Left Total Knee Arthroplasty  . TRANSTHORACIC ECHOCARDIOGRAM  03/28/2005   EF=>55%, mod conc LVH; LA mod dilated & borderline RA enlargement; mild TR; mild pulm valve regurg   Social History   Social History  . Marital Status: Married    Spouse Name: Mason Patton  . Number of Children: 3  . Years of Education: tech   Occupational History  . [employer] History Main Topics  . Smoking status: Current Every Day Smoker -- 0.50 packs/day for 50 years    Types: Cigarettes  . Smokeless tobacco: Never Used  .  Alcohol Use: No  . Drug Use: No   Current Outpatient Prescriptions on File Prior to Visit  Medication Sig Dispense Refill  . amLODipine (NORVASC) 10 MG tablet Take 10 mg by mouth daily.    [name] atorvastatin (LIPITOR) 20 MG tablet Reported on 01/11/2016    . Blood Glucose Monitoring Suppl (ONETOUCH VERIO FLEX SYSTEM) W/DEVICE KIT 1 each by Does not apply route daily. Dx: E11.9 1 kit 0  . Cyanocobalamin (VITAMIN B-12) 2000 MCG TBCR Take 1 tablet by mouth daily.    [name] dipyridamole-aspirin (AGGRENOX) 200-25 MG 12hr capsule Take 1 capsule by mouth 2 (two) times daily. 60 capsule 11  . glipiZIDE (GLUCOTROL XL) 10 MG 24 hr tablet TAKE 1 TABLET BY MOUTH  DAILY WITH BREAKFAST 90 tablet 1  . glucose blood (ONETOUCH VERIO) test strip Use to test blood sugar 2-3 times daily as  instructed. Dx: E11.9 200 each 3  . JANUVIA 100 MG tablet TAKE 1 TABLET BY MOUTH  DAILY 90 tablet 1  . levothyroxine (SYNTHROID, LEVOTHROID) 175 MCG tablet TAKE 1 TABLET BY MOUTH  DAILY BEFORE BREAKFAST 90 tablet 3  . metFORMIN (GLUCOPHAGE-XR) 500 MG 24 hr tablet TAKE 4 TABLETS BY MOUTH  DAILY WITH DINNER 360 tablet 3  . metoprolol succinate (TOPROL-XL) 100 MG 24 hr tablet Take 100 mg by mouth daily. Take with or immediately following a meal.    . ONETOUCH DELICA LANCETS FINE MISC Use to test blood sugar 2-3 times daily as instructed. Dx: E11.9 200 each 3  . triamcinolone cream (KENALOG) 0.1 % Apply 1 application topically 2 (two) times daily.   1  . valsartan-hydrochlorothiazide (DIOVAN-HCT) 320-12.5 MG per tablet Take 1 tablet by mouth daily.  1  . VENTOLIN HFA 108 (90 Base) MCG/ACT inhaler     . Vitamin D, Ergocalciferol, (DRISDOL) 50000 UNITS CAPS capsule Take 50,000 Units by mouth every 7 (seven) days.   2   No current facility-administered medications on file prior to visit.    Allergies  Allergen Reactions  . Codeine Nausea And Vomiting  . Demerol Nausea And Vomiting   Family History  Problem Relation Age of Onset  . Cancer  Maternal Aunt        colon  . Cancer Maternal Aunt        lung  . Thyroid disease Mother   . Hyperlipidemia Father   . Diabetes Maternal Grandmother   . Asthma Child   . Diabetes type I Child    PE: BP 134/78 (BP Location: Left Arm, Patient Position: Sitting)   Pulse (!) 53   Wt 281 lb (127.5 kg)   SpO2 95%   BMI 39.75 kg/m  Body mass index is 39.75 kg/m. Wt Readings from Last 3 Encounters:  04/15/17 281 lb (127.5 kg)  02/03/17 277 lb (125.6 kg)  10/15/16 279 lb (126.6 kg)   Constitutional: overweight, in NAD Eyes: PERRLA, EOMI, no exophthalmos ENT: moist mucous membranes, no thyromegaly, no cervical lymphadenopathy Cardiovascular: RRR, No RG, + 2/6 high pitched SEM Respiratory: CTA B Gastrointestinal: abdomen soft, NT, ND, BS+ Musculoskeletal: no deformities, strength intact in all 4 Skin: moist, warm, no rashes Neurological: no tremor with outstretched hands, DTR normal in all 4  ASSESSMENT: 1. 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 Hypothyroidism  PLAN:  1. Patient with long-standing, uncontrolled diabetes, on oral antidiabetic regimen, with worse control at this visit, unclear why. He was not sick, he did not miss medication doses, he did not have any other steroid courses. We discussed about trying to improve his diet, but will also try to move the metformin at night to hopefully improve the sugars in the morning. At next visit, if sugars are not improved, we may need to either switch to a GLP-1 receptor agonist, or add an SGLT2 inhibitor. I am worried about insurance coverage for these medications, though. - we will continue with the plan to cut the glipizide in half and take half before a large dinner - I suggested to:  Patient Instructions  Please move: - Metformin ER 2000 mg daily to dinnertime  Please continue: - Glipizide XL 10 mg in  am, before breakfast - Januvia 100 mg in am.  If you have a particularly large dinner, please cut the Glipizide in half and take half a  tab before dinner.  Please continue Levothyroxine 175 mcg.  Take the thyroid hormone every day, with water, at least 30 minutes before breakfast, separated by at least 4 hours from: - acid reflux medications - calcium - iron - multivitamins  Please stop at the lab.  Please return in 3 months with your sugar log.  - today we will recheck his fructosamine - continue checking sugars at different times of the day - check 1x a day, rotating checks - advised for yearly eye exams >> he is UTD - Return to clinic in 3 mo with sugar log    2. Hypothyroidism - latest thyroid labs reviewed with pt >> high, but as he had normal TFTs before on same dose >> we cont'd the same dose - he continues on LT4 175 mcg daily - pt feels good on this dose. - we discussed about taking the thyroid hormone every day, with water, >30 minutes before breakfast, separated by >4 hours from acid reflux medications, calcium, iron, multivitamins. Pt. is taking it correctly - will check thyroid tests today: TSH and fT4  Component     Latest Ref Rng & Units 04/15/2017  TSH     0.35 - 4.50 uIU/mL 8.93 (H)  T4,Free(Direct)     0.60 - 1.60 ng/dL 1.01  Fructosamine     190 - 270 umol/L 321 (H)   TSH high. Will increase his LT4 dose to 200 mcg daily and recheck TFTs at next visit. HbA1c calculated from fructosamine: higher, 7.0%.  Philemon Kingdom, MD PhD [facility] Endocrinology

## 2017-04-17 LAB — FRUCTOSAMINE: Fructosamine: 321 umol/L — ABNORMAL HIGH (ref 190–270)

## 2017-04-17 MED ORDER — LEVOTHYROXINE SODIUM 200 MCG PO TABS: ORAL_TABLET | ORAL | 3 refills | Status: DC

## 2017-05-26 ENCOUNTER — Ambulatory Visit (INDEPENDENT_AMBULATORY_CARE_PROVIDER_SITE_OTHER): Payer: 59 | Admitting: Family

## 2017-05-26 ENCOUNTER — Ambulatory Visit (INDEPENDENT_AMBULATORY_CARE_PROVIDER_SITE_OTHER): Payer: 59

## 2017-05-26 ENCOUNTER — Encounter (INDEPENDENT_AMBULATORY_CARE_PROVIDER_SITE_OTHER): Payer: Self-pay | Admitting: Orthopedic Surgery

## 2017-05-26 VITALS — Ht 70.0 in | Wt 281.0 lb

## 2017-05-26 DIAGNOSIS — M25562 Pain in left knee: Secondary | ICD-10-CM

## 2017-06-02 ENCOUNTER — Encounter (INDEPENDENT_AMBULATORY_CARE_PROVIDER_SITE_OTHER): Payer: Self-pay | Admitting: Family

## 2017-06-02 NOTE — Progress Notes (Signed)
Office Visit Note   Patient: Mason Patton           Date of Birth: 1946-05-06           MRN: [id number] Visit Date: 05/26/2017              Requested by: Roger KillWilliams, Breejante J, PA-C [id number] [place], [place] [id number] PCP: Roger KillWilliams, Breejante J, PA-C  Chief Complaint  Patient presents with  . Left Knee - Pain    Left knee pain s/p total knee replacement in 2014      HPI: The patient is a 71 year old gentleman who presents today for evaluation of left knee pain.  For about a week.  He has no recollection of injury.  States did well after total knee replacement which was about 4 years ago.  Today having global pain mild swelling increased pain with weightbearing.  No redness no fever no chills.  No mechanical symptoms.  Assessment & Plan: Visit Diagnoses:  1. Acute pain of left knee     Plan: Offered physical therapy.  Patient declined patient prefers to proceed with a home exercise program.  Have provided him with exercises and a handout.  He will follow-up in the office as needed.  May use anti-.  Did provide a pennsaid sample.  Follow-Up Instructions: Return in about 4 weeks (around 06/23/2017).   Left Knee Exam   Muscle Strength  The patient has normal left knee strength.  Tenderness  The patient is experiencing no tenderness.   Range of Motion  The patient has normal left knee ROM.  Tests  Varus: negative Valgus: negative  Other  Erythema: absent Scars: present Swelling: mild Effusion: no effusion present      Patient is alert, oriented, no adenopathy, well-dressed, normal affect, normal respiratory effort.   Imaging: No results found. No images are attached to the encounter.  Labs: Lab Results  Component Value Date   HGBA1C 8.3 02/03/2017   HGBA1C 9.2 10/15/2016   HGBA1C 8.4 07/16/2016   REPTSTATUS 03/27/2013 FINAL 03/23/2013   REPTSTATUS 03/23/2013 FINAL 03/23/2013   GRAMSTAIN  03/23/2013    WBC PRESENT, PREDOMINANTLY PMN NO  ORGANISMS SEEN [facility] Performed at [facility]   GRAMSTAIN  03/23/2013    WBC PRESENT, PREDOMINANTLY MONONUCLEAR NO ORGANISMS SEEN CYTOSPIN SLIDE   CULT  03/23/2013    NO GROWTH 3 DAYS [facility] at [facility]    @LABSALLVALUES [facility](HGBA1)@  @BMI1 @  Orders:  Orders Placed This Encounter  Procedures  . XR Knee 1-2 Views Left   No orders of the defined types were placed in this encounter.    Procedures: No procedures performed  Clinical Data: No additional findings.  ROS:  All other systems negative, except as noted in the HPI. Review of Systems  Constitutional: Negative for chills and fever.  Musculoskeletal: Positive for arthralgias and joint swelling.  Neurological: Negative for weakness.    Objective: Vital Signs: Ht 5\' 10"  (1.778 m)   Wt 281 lb (127.5 kg)   BMI 40.32 kg/m   Specialty Comments:  No specialty comments available.  PMFS History: Patient Active Problem List   Diagnosis Date Noted  . Uncontrolled type 2 diabetes mellitus with peripheral neuropathy (HCC) 03/27/2015  . 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 CVA (cerebral infarction) 11/16/2013  . Dizziness 11/15/2013  . Left sided lacunar infarction 11/15/2013  . Lacunar infarction 06/01/2013  . Acute, but ill-defined, cerebrovascular disease 06/01/2013  . 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  Acute CVA (cerebrovascular accident) (HCC) 03/29/2013  . Numbness and tingling 03/27/2013  . Hyperglycemia 03/26/2013  . Meningitis 03/25/2013  . Encephalopathy acute 03/23/2013  . Fever 03/23/2013  . OSA on CPAP 03/23/2013  . Obesity 03/23/2013  . Murmur 03/16/2013  . Preoperative cardiovascular examination 03/16/2013  . Dyslipidemia 03/16/2013  . Hypertension   . Cholecystitis with cholelithiasis   . Apnea, sleep 01/23/2010   Past Medical History:  Diagnosis Date  . Arthritis   . Cholecystitis with cholelithiasis   . Eczema   . Emphysema of lung  (HCC)   . Gallstones and inflammation of gallbladder without obstruction   . Heart murmur   . History of nuclear stress test 02/23/2010   dipyridamole; normal pattern of perfusion in all regions; normal, low risk study   . Hyperlipidemia   . Hypertension   . 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 Meningoencephalitis   . Obesity   . OSA on CPAP    last sleep study >8 yrs  . Pneumonia    "twice" (05/19/2013)  . Shortness of breath   . Stroke [facility])    "they said I had a minor one when I had menigitis" (05/19/2013)  . Thyroid disease   . Tobacco abuse    quit 11/08/2012  . Type II diabetes mellitus (HCC)   . Umbilical hernia     Family History  Problem Relation Age of Onset  . Cancer Maternal Aunt        colon  . Cancer Maternal Aunt        lung  . Thyroid disease Mother   . Hyperlipidemia Father   . Diabetes Maternal Grandmother   . Asthma Child   . Diabetes type I Child     Past Surgical History:  Procedure Laterality Date  . CHOLECYSTECTOMY  09/09/2011   Procedure: LAPAROSCOPIC CHOLECYSTECTOMY WITH INTRAOPERATIVE CHOLANGIOGRAM;  Surgeon: Jetty Duhamel, MD;  Location: [facility];  Service: General;  Laterality: N/A;  . HERNIA REPAIR  04/2006   UHR  . JOINT REPLACEMENT    . KNEE ARTHROSCOPY Right    "w2 times" (05/19/2013)  . TONSILLECTOMY    . TOTAL KNEE ARTHROPLASTY Left 05/19/2013  . TOTAL KNEE ARTHROPLASTY Left 05/19/2013   Procedure: TOTAL KNEE ARTHROPLASTY;  Surgeon: Nadara Mustard, MD;  Location: [facility];  Service: Orthopedics;  Laterality: Left;  Left Total Knee Arthroplasty  . TRANSTHORACIC ECHOCARDIOGRAM  03/28/2005   EF=>55%, mod conc LVH; LA mod dilated & borderline RA enlargement; mild TR; mild pulm valve regurg   Social History   Occupational History    Employer: [employer]  Tobacco Use  . Smoking status: Current Every Day Smoker    Packs/day: 0.50    Years: 50.00    Pack years: 25.00    Types: Cigarettes  . Smokeless tobacco:  Never Used  Substance and Sexual Activity  . Alcohol use: No  . Drug use: No  . Sexual activity: Yes

## 2017-07-09 ENCOUNTER — Other Ambulatory Visit: Payer: Self-pay | Admitting: Internal Medicine

## 2017-07-24 ENCOUNTER — Ambulatory Visit: Payer: 59 | Admitting: Internal Medicine

## 2017-07-24 ENCOUNTER — Encounter: Payer: Self-pay | Admitting: Internal Medicine

## 2017-07-24 VITALS — BP 130/80 | HR 58 | Ht 70.0 in | Wt 283.0 lb

## 2017-07-24 DIAGNOSIS — E1165 Type 2 diabetes mellitus with hyperglycemia: Secondary | ICD-10-CM

## 2017-07-24 DIAGNOSIS — E890 Postprocedural hypothyroidism: Secondary | ICD-10-CM

## 2017-07-24 DIAGNOSIS — IMO0002 Reserved for concepts with insufficient information to code with codable children: Secondary | ICD-10-CM

## 2017-07-24 DIAGNOSIS — E1142 Type 2 diabetes mellitus with diabetic polyneuropathy: Secondary | ICD-10-CM

## 2017-07-24 LAB — TSH: TSH: 5.45 u[IU]/mL — AB (ref 0.35–4.50)

## 2017-07-24 LAB — POCT GLYCOSYLATED HEMOGLOBIN (HGB A1C): HEMOGLOBIN A1C: 7.7

## 2017-07-24 LAB — T4, FREE: FREE T4: 1.09 ng/dL (ref 0.60–1.60)

## 2017-07-24 MED ORDER — GLIPIZIDE ER 5 MG PO TB24: 10.0000 mg | ORAL_TABLET | Freq: Every day | ORAL | 3 refills | Status: DC

## 2017-07-24 NOTE — Patient Instructions (Addendum)
Please continue: - Metformin ER 2000 mg daily at dinnertime - Januvia 100 mg in am.  Please decrease: - Glipizide XL to 5 mg in am, before breakfast  Please continue Levothyroxine 200 mcg.  Take the thyroid hormone every day, with water, at least 30 minutes before breakfast, separated by at least 4 hours from: - acid reflux medications - calcium - iron - multivitamins  Please stop at the lab.  Please return in 3-4 months with your sugar log.

## 2017-07-24 NOTE — Progress Notes (Signed)
Patient ID: [facility], male   DOB: Nov 17, 1945, 72 y.o.   MRN: [id number]  HPI: [facility] is a 72 y.o.-year-old male, initially referred by his PCP, Nena Polio, NP, now returrning for f/u for DM2, dx in 2010, non-insulin-dependent, uncontrolled, with complications (retinopathy, PN, cerebrovascular disease-s/p stroke) and also hypothyroidism. Last visit 3 mo ago. Has [facility] also.  He started to cange his diet ~ 3 mo ago >> sugars much better.  No lows.  DM2: Last hemoglobin A1c was: 04/2017: HbA1c calculated from the fructosamine: 7.0% 02/03/2017: HbA1c calculated from fructosamine: 6.84%. 10/15/2016: HbA1c calculated from fructosamine: 6.76% Lab Results  Component Value Date   HGBA1C 8.3 02/03/2017   HGBA1C 9.2 10/15/2016   HGBA1C 8.4 07/16/2016  04/25/2015: HbA1c 8.2% 01/31/2015: HbA1c 8.4% 10/13/2014: HbA1c 7.8% 07/28/2014: HbA1c 7.3% 07/23/2013: HbA1c 6.6%  Pt is on: - Metformin ER 2000 mg at dinnertime - Glipizide XL 10 mg in am, before breakfast - Januvia 100 mg in am  Pt checks his sugars 0 to 1x a day: - am: 98-130, 140 >> 110-130 >> 100-140 >> 140-180 >> 80-90s - 2h after b'fast: n/c >> 106-123 >> n/c - before lunch: n/c >> 64-157, 172 >> n/c - 2h after lunch: n/c >> 139-180 >> 139-150 >> n/c - before dinner: 130-140 >> 100-150s, 180, 200s >> 140-180 >> 110-120 - 2h after dinner:117-165 >> 93 >> n/c >> 200 >> n/c - bedtime: n/c >> 135 >> n/c - nighttime: n/c Lowest sugar was 100 >> 140 >> 81; he has hypoglycemia awareness at 70s.  Highest sugar was 200s (1h after dinner) >> 200 >> 120.   Glucometer: One Touch Verio  Pt's meals are: - Breakfast: skip or biscuit, eggs, toast, grits - Lunch: fast food - Dinner: meat + potatoes + salads; beans - Snacks: rarely; fruit   -No CKD, last BUN/creatinine:  11/22/2016: 12/0.74 Lab Results  Component Value Date   BUN 17 04/12/2016   CREATININE 0.77 04/12/2016  04/25/2015: BUN/Cr 9/0.7  He has a history  of microalbuminuria - normalized at last check: Lab Results  Component Value Date   MICRALBCREAT 4.7 02/03/2017  10/13/2014: ACR 147 07/28/2014: ACR 63 On Diovan.  -+ HL; last set of lipids: 11/22/2016: 110/112/28/69 Lab Results  Component Value Date   CHOL 106 04/12/2016   HDL 27.50 (L) 04/12/2016   LDLCALC 57 04/12/2016   TRIG 109.0 04/12/2016   CHOLHDL 4 04/12/2016  04/25/2015: 87/125/27/35 On Lipitor 40.  - last eye exam was in 12/2016: + DR [facility]).  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  Pt is on levothyroxine 200 mcg daily, taken: - in am - fasting - at least 30 min from b'fast - no Ca, Fe, MVI, PPIs - not on Biotin  Reviewed TFTs -last TSH levels were high >>  increased dose in 04/2017 Lab Results  Component Value Date   TSH 8.93 (H) 04/15/2017   TSH 7.19 (H) 02/03/2017   TSH 2.58 04/12/2016   TSH 2.40 10/12/2015   TSH 18.09 (H) 07/14/2015   TSH 0.105 (L) 03/23/2013   FREET4 1.01 04/15/2017   FREET4 0.96 02/03/2017   FREET4 1.11 04/12/2016   FREET4 1.30 10/12/2015   FREET4 0.85 07/14/2015  04/25/2015: TSH 2.48 01/31/2015: TSH 5.747 (0.4-5.5) - after starting taking it at night 10/13/2014: TSH 2.619 03/08/2014: TSH 2.134 07/23/2013: TSH 3.869  He also has a history of  hypertension, emphysema,  Hyperlipidemia, prostate hypertrophy, OSA, OA, fibromyalgia, heart murmur.  ROS: Constitutional: no weight gain/no weight loss, no fatigue, no subjective hyperthermia, no subjective hypothermia, + nocturia 2-3x a night Eyes: no blurry vision, no xerophthalmia ENT: no sore throat, no nodules palpated in throat, no dysphagia, no odynophagia, no hoarseness Cardiovascular: no CP/no SOB/no palpitations/no leg swelling Respiratory: no cough/no SOB/no wheezing Gastrointestinal: no N/no V/no D/no C/no acid reflux Musculoskeletal: no muscle aches/no joint aches Skin: no  rashes, no hair loss Neurological: no tremors/no numbness/no tingling/no dizziness  I reviewed pt's medications, allergies, PMH, social hx, family hx, and changes were documented in the history of present illness. Otherwise, unchanged from my initial visit note.  Past Medical History:  Diagnosis Date  . Arthritis   . Cholecystitis with cholelithiasis   . Eczema   . Emphysema of lung ([place])   . Gallstones and inflammation of gallbladder without obstruction   . Heart murmur   . History of nuclear stress test 02/23/2010   dipyridamole; normal pattern of perfusion in all regions; normal, low risk study   . Hyperlipidemia   . Hypertension   . 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 Meningoencephalitis   . Obesity   . OSA on CPAP    last sleep study >8 yrs  . Pneumonia    "twice" (05/19/2013)  . Shortness of breath   . Stroke [facility])    "they said I had a minor one when I had menigitis" (05/19/2013)  . Thyroid disease   . Tobacco abuse    quit 11/08/2012  . Type II diabetes mellitus ([place])   . Umbilical hernia    Past Surgical History:  Procedure Laterality Date  . CHOLECYSTECTOMY  09/09/2011   Procedure: LAPAROSCOPIC CHOLECYSTECTOMY WITH INTRAOPERATIVE CHOLANGIOGRAM;  Surgeon: Belva Crome, MD;  Location: [place];  Service: General;  Laterality: N/A;  . HERNIA REPAIR  04/2006   UHR  . JOINT REPLACEMENT    . KNEE ARTHROSCOPY Right    "w2 times" (05/19/2013)  . TONSILLECTOMY    . TOTAL KNEE ARTHROPLASTY Left 05/19/2013  . TOTAL KNEE ARTHROPLASTY Left 05/19/2013   Procedure: TOTAL KNEE ARTHROPLASTY;  Surgeon: Newt Minion, MD;  Location: [place];  Service: Orthopedics;  Laterality: Left;  Left Total Knee Arthroplasty  . TRANSTHORACIC ECHOCARDIOGRAM  03/28/2005   EF=>55%, mod conc LVH; LA mod dilated & borderline RA enlargement; mild TR; mild pulm valve regurg   Social History   Social History  . Marital Status: Married    Spouse Name: sandra  . Number  of Children: 3  . Years of Education: tech   Occupational History  . [employer] [place] History Main Topics  . Smoking status: Current Every Day Smoker -- 0.50 packs/day for 50 years    Types: Cigarettes  . Smokeless tobacco: Never Used  . Alcohol Use: No  . Drug Use: No   Current Outpatient Medications on File Prior to Visit  Medication Sig Dispense Refill  . amLODipine (NORVASC) 10 MG tablet Take 10 mg by mouth daily.    [name] atorvastatin (LIPITOR) 20 MG tablet Reported on 01/11/2016    . Blood Glucose Monitoring Suppl (ONETOUCH VERIO FLEX SYSTEM) W/DEVICE KIT 1 each by Does not apply route daily. Dx: E11.9 1 kit 0  . Cyanocobalamin (VITAMIN B-12) 2000 MCG TBCR Take 1 tablet by mouth daily.    [name] dipyridamole-aspirin (AGGRENOX) 200-25 MG 12hr capsule Take  1 capsule by mouth 2 (two) times daily. 60 capsule 11  . glipiZIDE (GLUCOTROL XL) 10 MG 24 hr tablet TAKE 1 TABLET BY MOUTH  DAILY WITH BREAKFAST 90 tablet 1  . glucose blood (ONETOUCH VERIO) test strip Use to test blood sugar 2-3 times daily as instructed. Dx: E11.9 200 each 3  . JANUVIA 100 MG tablet TAKE 1 TABLET BY MOUTH  DAILY 90 tablet 1  . levothyroxine (SYNTHROID, LEVOTHROID) 200 MCG tablet TAKE 1 TABLET BY MOUTH  DAILY BEFORE BREAKFAST 90 tablet 3  . metFORMIN (GLUCOPHAGE-XR) 500 MG 24 hr tablet TAKE 4 TABLETS BY MOUTH  DAILY WITH DINNER 360 tablet 3  . metoprolol succinate (TOPROL-XL) 100 MG 24 hr tablet Take 100 mg by mouth daily. Take with or immediately following a meal.    . ONETOUCH DELICA LANCETS FINE MISC Use to test blood sugar 2-3 times daily as instructed. Dx: E11.9 200 each 3  . triamcinolone cream (KENALOG) 0.1 % Apply 1 application topically 2 (two) times daily.   1  . valsartan-hydrochlorothiazide (DIOVAN-HCT) 320-12.5 MG per tablet Take 1 tablet by mouth daily.  1  . VENTOLIN HFA 108 (90 Base) MCG/ACT inhaler     . Vitamin D, Ergocalciferol, (DRISDOL) 50000 UNITS CAPS capsule Take 50,000  Units by mouth every 7 (seven) days.   2   No current facility-administered medications on file prior to visit.    Allergies  Allergen Reactions  . Codeine Nausea And Vomiting  . Demerol Nausea And Vomiting   Family History  Problem Relation Age of Onset  . Cancer Maternal Aunt        colon  . Cancer Maternal Aunt        lung  . Thyroid disease Mother   . Hyperlipidemia Father   . Diabetes Maternal Grandmother   . Asthma Child   . Diabetes type I Child    PE: BP 130/80   Pulse (!) 58   Ht '5\' 10"'  (1.778 m)   Wt 283 lb (128.4 kg)   SpO2 97%   BMI 40.61 kg/m  Body mass index is 40.61 kg/m. Wt Readings from Last 3 Encounters:  07/24/17 283 lb (128.4 kg)  05/26/17 281 lb (127.5 kg)  04/15/17 281 lb (127.5 kg)   Constitutional: overweight, in NAD Eyes: PERRLA, EOMI, no exophthalmos ENT: moist mucous membranes, no thyromegaly, no cervical lymphadenopathy Cardiovascular: RRR, No RG, + 2/6 high-pitched SEM Respiratory: CTA B Gastrointestinal: abdomen soft, NT, ND, BS+ Musculoskeletal: no deformities, strength intact in all 4 Skin: moist, warm, no rashes Neurological: no tremor with outstretched hands, DTR normal in all 4  ASSESSMENT: 1. 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 Hypothyroidism  PLAN:  1. Patient with long-standing, uncontrolled, diabetes, on oral antidiabetic regimen, with better control in the last year based on his fructosamine levels.  Since last visit, however, he improved his diet and his sugars are greatly improved.  He does not have any lows and no hyperglycemic spikes.  All sugars are at goal.  Therefore, we will go ahead and decrease his glipizide XL dose and at next visit we may be able to stop it.  We will continue metformin and Januvia. - I suggested to:  Patient Instructions  Please continue: - Metformin ER 2000 mg daily  at dinnertime - Glipizide XL 10 mg in am, before breakfast - Januvia 100 mg in am. If you have a  particularly large dinner, please cut the Glipizide in half and take half a tab before dinner.  Please continue Levothyroxine 200 mcg.  Take the thyroid hormone every day, with water, at least 30 minutes before breakfast, separated by at least 4 hours from: - acid reflux medications - calcium - iron - multivitamins  Please stop at the lab.  Please return in 3-4 months with your sugar log.  - today, HbA1c is 7.7% (however, for him, fructosamine levels are more accurate >> will check this today) - continue checking sugars at different times of the day - check 1x a day, rotating checks - advised for yearly eye exams >> he is UTD - Return to clinic in 3 mo with sugar log     2. Hypothyroidism - latest thyroid labs reviewed with pt >> TSH high >> increased LT4 to 200 mcg daily - he continues on LT4 200  mcg daily - pt feels good on this dose. - we discussed about taking the thyroid hormone every day, with water, >30 minutes before breakfast, separated by >4 hours from acid reflux medications, calcium, iron, multivitamins. Pt. is taking it correctly - will check thyroid tests today: TSH and fT4 - If labs are abnormal, he will need to return for repeat TFTs in 1.5 months  Component     Latest Ref Rng & Units 07/24/2017  TSH     0.35 - 4.50 uIU/mL 5.45 (H)  Hemoglobin A1C      7.7  T4,Free(Direct)     0.60 - 1.60 ng/dL 1.09  Fructosamine     190 - 270 umol/L 270  TSH is only slightly above the upper limit of normal.  I suspect a degree of noncompliance, since he is on a high dose of levothyroxine.  We will continue the current dose for now. His HbA1c calculated from fructosamine is 6.2% (better than the one measured).  Philemon Kingdom, MD PhD [facility] Endocrinology

## 2017-07-28 LAB — FRUCTOSAMINE: FRUCTOSAMINE: 270 umol/L (ref 190–270)

## 2017-11-21 ENCOUNTER — Ambulatory Visit: Payer: Medicare Other | Admitting: Internal Medicine

## 2017-12-24 ENCOUNTER — Other Ambulatory Visit: Payer: Self-pay | Admitting: Internal Medicine

## 2019-09-22 ENCOUNTER — Other Ambulatory Visit: Payer: Self-pay

## 2019-09-22 ENCOUNTER — Ambulatory Visit (INDEPENDENT_AMBULATORY_CARE_PROVIDER_SITE_OTHER): Payer: Medicare Other

## 2019-09-22 ENCOUNTER — Ambulatory Visit (INDEPENDENT_AMBULATORY_CARE_PROVIDER_SITE_OTHER): Payer: Medicare Other | Admitting: Family

## 2019-09-22 ENCOUNTER — Encounter: Payer: Self-pay | Admitting: Family

## 2019-09-22 VITALS — Ht 70.0 in | Wt 283.0 lb

## 2019-09-22 DIAGNOSIS — M25561 Pain in right knee: Secondary | ICD-10-CM

## 2019-09-22 MED ORDER — METHYLPREDNISOLONE ACETATE 40 MG/ML IJ SUSP
40.0000 mg | INTRAMUSCULAR | Status: AC | PRN
  Administered 2019-09-22: 11:00:00 40 mg via INTRA_ARTICULAR

## 2019-09-22 MED ORDER — LIDOCAINE HCL 1 % IJ SOLN
5.0000 mL | INTRAMUSCULAR | Status: AC | PRN
  Administered 2019-09-22: 5 mL

## 2019-09-22 NOTE — Progress Notes (Signed)
Office Visit Note   Patient: [place] Patton           Date of Birth: 1945-07-30           MRN: [id number] Visit Date: 09/22/2019              Requested by: Roger Kill, PA-C [id number] [place],  [place] [address] PCP: Roger Kill, PA-C  Chief Complaint  Patient presents with  . Right Knee - Pain    DOI 09/21/19      HPI: The patient is a 74 year old gentleman who states he was just walking in his yard yesterday when he felt a pop followed by intense pain of the right knee.  States he has not previously had any injury.  Have a fall has since been unable to weight-bear due to pain.  Assessment & Plan: Visit Diagnoses:  1. Acute pain of right knee     Plan: Depo-Medrol injection today concern for meniscal injury.  If no relief with the injection we will consider MRI of the right knee.  Follow-Up Instructions: Return in about 4 weeks (around 10/20/2019), or if symptoms worsen or fail to improve.   Right Knee Exam   Muscle Strength  The patient has normal right knee strength.  Tenderness  The patient is experiencing tenderness in the medial joint line.  Range of Motion  The patient has normal right knee ROM.  Tests  Varus: negative Valgus: negative  Other  Erythema: absent Swelling: mild Effusion: no effusion present      Patient is alert, oriented, no adenopathy, well-dressed, normal affect, normal respiratory effort.   Imaging: No results found. No images are attached to the encounter.  Labs: Lab Results  Component Value Date   HGBA1C 7.7 07/24/2017   HGBA1C 8.3 02/03/2017   HGBA1C 9.2 10/15/2016   REPTSTATUS 03/27/2013 FINAL 03/23/2013   REPTSTATUS 03/23/2013 FINAL 03/23/2013   GRAMSTAIN  03/23/2013    WBC PRESENT, PREDOMINANTLY PMN NO ORGANISMS SEEN Performed at [facility] Performed at [facility]   GRAMSTAIN  03/23/2013    WBC PRESENT, PREDOMINANTLY MONONUCLEAR NO ORGANISMS SEEN CYTOSPIN SLIDE     CULT  03/23/2013    NO GROWTH 3 DAYS Performed at [facility]     Lab Results  Component Value Date   ALBUMIN 4.1 04/12/2016   ALBUMIN 3.8 11/07/2014   ALBUMIN 3.5 11/15/2013    No results found for: MG No results found for: VD25OH  No results found for: PREALBUMIN CBC EXTENDED Latest Ref Rng & Units 11/07/2014 11/15/2013 05/21/2013  WBC 4.0 - 10.5 K/uL 9.9 9.6 13.1(H)  RBC 4.22 - 5.81 MIL/uL 4.62 4.48 3.30(L)  HGB 13.0 - 17.0 g/dL 11.9 12.7(L) 9.6(L)  HCT 39.0 - 52.0 % 40.9 38.4(L) 28.5(L)  PLT 150 - 400 K/uL 196 201 156  NEUTROABS 1.7 - 7.7 K/uL - 7.2 -  LYMPHSABS 0.7 - 4.0 K/uL - 1.5 -     Body mass index is 40.61 kg/m.  Orders:  Orders Placed This Encounter  Procedures  . XR Knee 1-2 Views Right   No orders of the defined types were placed in this encounter.    Procedures: Large Joint Inj: R knee on 09/22/2019 10:32 AM Indications: pain and diagnostic evaluation Details: 22 G 1.5 in needle  Arthrogram: No  Medications: 40 mg methylPREDNISolone acetate 40 MG/ML; 5 mL lidocaine 1 % Outcome: tolerated well, no immediate complications Procedure, treatment alternatives, risks and benefits explained,  specific risks discussed. Consent was given by the patient. Immediately prior to procedure a time out was called to verify the correct patient, procedure, equipment, support staff and site/side marked as required. Patient was prepped and draped in the usual sterile fashion.      Clinical Data: No additional findings.  ROS:  All other systems negative, except as noted in the HPI. Review of Systems  Constitutional: Negative for chills and fever.  Musculoskeletal: Positive for arthralgias and gait problem.    Objective: Vital Signs: Ht 5\' 10"  (1.778 m)   Wt 283 lb (128.4 kg)   BMI 40.61 kg/m   Specialty Comments:  No specialty comments available.  PMFS History: Patient Active Problem List   Diagnosis Date Noted  . Uncontrolled type 2 diabetes  mellitus with peripheral neuropathy ([place]) 03/27/2015  . 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 CVA (cerebral infarction) 11/16/2013  . Dizziness 11/15/2013  . Left sided lacunar infarction ([place]) 11/15/2013  . Lacunar infarction ([place]) 06/01/2013  . Acute, but ill-defined, cerebrovascular disease 06/01/2013  . 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 Acute CVA (cerebrovascular accident) ([place]) 03/29/2013  . Numbness and tingling 03/27/2013  . Hyperglycemia 03/26/2013  . Meningitis 03/25/2013  . Encephalopathy acute 03/23/2013  . Fever 03/23/2013  . OSA on CPAP 03/23/2013  . Obesity 03/23/2013  . Murmur 03/16/2013  . Preoperative cardiovascular examination 03/16/2013  . Dyslipidemia 03/16/2013  . Hypertension   . Cholecystitis with cholelithiasis   . Apnea, sleep 01/23/2010   Past Medical History:  Diagnosis Date  . Arthritis   . Cholecystitis with cholelithiasis   . Eczema   . Emphysema of lung ([place])   . Gallstones and inflammation of gallbladder without obstruction   . Heart murmur   . History of nuclear stress test 02/23/2010   dipyridamole; normal pattern of perfusion in all regions; normal, low risk study   . Hyperlipidemia   . Hypertension   . 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 Meningoencephalitis   . Obesity   . OSA on CPAP    last sleep study >8 yrs  . Pneumonia    "twice" (05/19/2013)  . Shortness of breath   . Stroke [facility])    "they said I had a minor one when I had menigitis" (05/19/2013)  . Thyroid disease   . Tobacco abuse    quit 11/08/2012  . Type II diabetes mellitus ([place])   . Umbilical hernia     Family History  Problem Relation Age of Onset  . Cancer Maternal Aunt        colon  . Cancer Maternal Aunt        lung  . Thyroid disease Mother   . Hyperlipidemia Father   . Diabetes Maternal Grandmother   . Asthma Child   . Diabetes type I Child     Past Surgical History:  Procedure Laterality Date  . CHOLECYSTECTOMY   09/09/2011   Procedure: LAPAROSCOPIC CHOLECYSTECTOMY WITH INTRAOPERATIVE CHOLANGIOGRAM;  Surgeon: Belva Crome, MD;  Location: [place];  Service: General;  Laterality: N/A;  . HERNIA REPAIR  04/2006   UHR  . JOINT REPLACEMENT    . KNEE ARTHROSCOPY Right    "w2 times" (05/19/2013)  . TONSILLECTOMY    . TOTAL KNEE ARTHROPLASTY Left 05/19/2013  . TOTAL KNEE ARTHROPLASTY Left 05/19/2013   Procedure: TOTAL KNEE ARTHROPLASTY;  Surgeon: Newt Minion, MD;  Location: [place];  Service: Orthopedics;  Laterality: Left;  Left Total Knee Arthroplasty  . TRANSTHORACIC  ECHOCARDIOGRAM  03/28/2005   EF=>55%, mod conc LVH; LA mod dilated & borderline RA enlargement; mild TR; mild pulm valve regurg   Social History   Occupational History    Employer: [employer]  Tobacco Use  . Smoking status: Current Every Day Smoker    Packs/day: 0.50    Years: 50.00    Pack years: 25.00    Types: Cigarettes  . Smokeless tobacco: Never Used  Substance and Sexual Activity  . Alcohol use: No  . Drug use: No  . Sexual activity: Yes

## 2019-09-28 ENCOUNTER — Telehealth: Payer: Self-pay | Admitting: Family

## 2019-09-28 DIAGNOSIS — M25561 Pain in right knee: Secondary | ICD-10-CM

## 2019-09-28 NOTE — Telephone Encounter (Signed)
Patient called and stated he had inj on 09/22/19 and he is not feeling any better. Was told to call back if not better. Asked if patient wanted an appt he stated no he wanted someone to call him.  Please call patient to advise [phone]

## 2019-09-28 NOTE — Telephone Encounter (Signed)
I called and sw pt to advise. Will call with any questions.  

## 2019-10-17 ENCOUNTER — Other Ambulatory Visit: Payer: Self-pay

## 2019-10-17 ENCOUNTER — Ambulatory Visit
Admission: RE | Admit: 2019-10-17 | Discharge: 2019-10-17 | Disposition: A | Discharge status: Home / Self Care | Payer: Medicare Other | Source: Ambulatory Visit | Attending: Family | Admitting: Family

## 2019-10-17 DIAGNOSIS — M25561 Pain in right knee: Secondary | ICD-10-CM

## 2019-10-18 ENCOUNTER — Telehealth: Payer: Self-pay | Admitting: Orthopedic Surgery

## 2019-10-18 NOTE — Telephone Encounter (Signed)
Patient called wanting to know if his wife can come into the appointment with him on Thursday.  Patient stated that he forgets things.  CB#[phone].  Please advise patient.  Thank you.

## 2019-10-20 NOTE — Telephone Encounter (Signed)
I called and lm on vm to advise that in keeping with covid 19 protocol we are asking that only the pt come back to the clinical area and that we can call his spouse during the appointment she that she can be present to hear all information and ask questions.

## 2019-10-21 ENCOUNTER — Ambulatory Visit (INDEPENDENT_AMBULATORY_CARE_PROVIDER_SITE_OTHER): Payer: Medicare Other | Admitting: Orthopedic Surgery

## 2019-10-21 ENCOUNTER — Telehealth: Payer: Self-pay | Admitting: Orthopedic Surgery

## 2019-10-21 ENCOUNTER — Other Ambulatory Visit: Payer: Self-pay

## 2019-10-21 DIAGNOSIS — M1711 Unilateral primary osteoarthritis, right knee: Secondary | ICD-10-CM | POA: Diagnosis not present

## 2019-10-21 DIAGNOSIS — I872 Venous insufficiency (chronic) (peripheral): Secondary | ICD-10-CM

## 2019-10-21 NOTE — Telephone Encounter (Signed)
Patient called needing a handicap placard. The number to contact patient is [phone]

## 2019-10-22 ENCOUNTER — Telehealth: Payer: Self-pay | Admitting: Orthopedic Surgery

## 2019-10-22 ENCOUNTER — Encounter: Payer: Self-pay | Admitting: Orthopedic Surgery

## 2019-10-22 NOTE — Telephone Encounter (Signed)
Sent to wrong Roxanne Orner

## 2019-10-22 NOTE — Telephone Encounter (Signed)
Patient called about a handicap form. I informed him that it was singed and mailed to him.

## 2019-10-22 NOTE — Telephone Encounter (Signed)
Signed nd in the mail to the pt.

## 2019-10-22 NOTE — Progress Notes (Signed)
Office Visit Note   Patient: Mason Patton           Date of Birth: 1946/05/02           MRN: [id number] Visit Date: 10/21/2019              Requested by: Heywood Bene, PA-C [address] [place],  [place] [address] PCP: Heywood Bene, PA-C  Chief Complaint  Patient presents with  . Right Knee - Pain      HPI: Patient is a 74 year old gentleman who presents with persistent pain right knee.  Patient denies any mechanical catching or locking at this time however that was his initial presentation.  He states he has had no relief with injection and has had no relief with previous arthroscopies.  Patient is status post a left total knee arthroplasty.  He states his last hemoglobin A1c was 7.  Assessment & Plan: Visit Diagnoses:  1. Unilateral primary osteoarthritis, right knee   2. Venous insufficiency of both lower extremities     Plan: Discussed with the patient nonoperative versus operative options.  Discussed the best surgical intervention would be to proceed with a total knee arthroplasty.  Risks and benefits of surgery were discussed.  Patient states he understands and would like to proceed with total knee arthroplasty.  Follow-Up Instructions: Return in about 2 weeks (around 11/04/2019).   Ortho Exam  Patient is alert, oriented, no adenopathy, well-dressed, normal affect, normal respiratory effort. Examination patient ambulates with crutches.  There is no mechanical catching or locking with range of motion collaterals and cruciates are stable.  Review of the MRI scan shows significant tricompartmental arthritis with medial and lateral meniscal tears with a small joint effusion.  Imaging: No results found. No images are attached to the encounter.  Labs: Lab Results  Component Value Date   HGBA1C 7.7 07/24/2017   HGBA1C 8.3 02/03/2017   HGBA1C 9.2 10/15/2016   REPTSTATUS 03/27/2013 FINAL 03/23/2013   REPTSTATUS 03/23/2013 FINAL 03/23/2013   GRAMSTAIN  03/23/2013    WBC PRESENT, PREDOMINANTLY PMN NO ORGANISMS SEEN Performed at [facility] Performed at [facility]  03/23/2013    WBC PRESENT, PREDOMINANTLY MONONUCLEAR NO ORGANISMS SEEN CYTOSPIN SLIDE   CULT  03/23/2013    NO GROWTH 3 DAYS Performed at [facility]     Lab Results  Component Value Date   ALBUMIN 4.1 04/12/2016   ALBUMIN 3.8 11/07/2014   ALBUMIN 3.5 11/15/2013    No results found for: MG No results found for: VD25OH  No results found for: PREALBUMIN CBC EXTENDED Latest Ref Rng & Units 11/07/2014 11/15/2013 05/21/2013  WBC 4.0 - 10.5 K/uL 9.9 9.6 13.1(H)  RBC 4.22 - 5.81 MIL/uL 4.62 4.48 3.30(L)  HGB 13.0 - 17.0 g/dL 14.1 12.7(L) 9.6(L)  HCT 39.0 - 52.0 % 40.9 38.4(L) 28.5(L)  PLT 150 - 400 K/uL 196 201 156  NEUTROABS 1.7 - 7.7 K/uL - 7.2 -  LYMPHSABS 0.7 - 4.0 K/uL - 1.5 -     There is no height or weight on file to calculate BMI.  Orders:  No orders of the defined types were placed in this encounter.  No orders of the defined types were placed in this encounter.    Procedures: No procedures performed  Clinical Data: No additional findings.  ROS:  All other systems negative, except as noted in the HPI. Review of Systems  Objective: Vital Signs: There were no vitals  taken for this visit.  Specialty Comments:  No specialty comments available.  PMFS History: Patient Active Problem List   Diagnosis Date Noted  . Uncontrolled type 2 diabetes mellitus with peripheral neuropathy (HCC) 03/27/2015  . 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 CVA (cerebral infarction) 11/16/2013  . Dizziness 11/15/2013  . Left sided lacunar infarction (HCC) 11/15/2013  . Lacunar infarction (HCC) 06/01/2013  . Acute, but ill-defined, cerebrovascular disease 06/01/2013  . 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 Acute CVA (cerebrovascular accident) (HCC) 03/29/2013  . Numbness and tingling 03/27/2013  . Hyperglycemia  03/26/2013  . Meningitis 03/25/2013  . Encephalopathy acute 03/23/2013  . Fever 03/23/2013  . OSA on CPAP 03/23/2013  . Obesity 03/23/2013  . Murmur 03/16/2013  . Preoperative cardiovascular examination 03/16/2013  . Dyslipidemia 03/16/2013  . Hypertension   . Cholecystitis with cholelithiasis   . Apnea, sleep 01/23/2010   Past Medical History:  Diagnosis Date  . Arthritis   . Cholecystitis with cholelithiasis   . Eczema   . Emphysema of lung (HCC)   . Gallstones and inflammation of gallbladder without obstruction   . Heart murmur   . History of nuclear stress test 02/23/2010   dipyridamole; normal pattern of perfusion in all regions; normal, low risk study   . Hyperlipidemia   . Hypertension   . 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 Meningoencephalitis   . Obesity   . OSA on CPAP    last sleep study >8 yrs  . Pneumonia    "twice" (05/19/2013)  . Shortness of breath   . Stroke [facility])    "they said I had a minor one when I had menigitis" (05/19/2013)  . Thyroid disease   . Tobacco abuse    quit 11/08/2012  . Type II diabetes mellitus (HCC)   . Umbilical hernia     Family History  Problem Relation Age of Onset  . Cancer Maternal Aunt        colon  . Cancer Maternal Aunt        lung  . Thyroid disease Mother   . Hyperlipidemia Father   . Diabetes Maternal Grandmother   . Asthma Child   . Diabetes type I Child     Past Surgical History:  Procedure Laterality Date  . CHOLECYSTECTOMY  09/09/2011   Procedure: LAPAROSCOPIC CHOLECYSTECTOMY WITH INTRAOPERATIVE CHOLANGIOGRAM;  Surgeon: Jetty Duhamel, MD;  Location[facility];  Service: General;  Laterality: N/A;  . HERNIA REPAIR  04/2006   UHR  . JOINT REPLACEMENT    . KNEE ARTHROSCOPY Right    "w2 times" (05/19/2013)  . TONSILLECTOMY    . TOTAL KNEE ARTHROPLASTY Left 05/19/2013  . TOTAL KNEE ARTHROPLASTY Left 05/19/2013   Procedure: TOTAL KNEE ARTHROPLASTY;  Surgeon: Nadara Mustard, MD;   Location: [facility] OR;  Service: Orthopedics;  Laterality: Left;  Left Total Knee Arthroplasty  . TRANSTHORACIC ECHOCARDIOGRAM  03/28/2005   EF=>55%, mod conc LVH; LA mod dilated & borderline RA enlargement; mild TR; mild pulm valve regurg   Social History   Occupational History    Employer: [employer]  Tobacco Use  . Smoking status: Current Every Day Smoker    Packs/day: 0.50    Years: 50.00    Pack years: 25.00    Types: Cigarettes  . Smokeless tobacco: Never Used  Substance and Sexual Activity  . Alcohol use: No  . Drug use: No  . Sexual activity: Yes

## 2019-10-27 ENCOUNTER — Telehealth: Payer: Self-pay | Admitting: Orthopedic Surgery

## 2019-10-27 NOTE — Telephone Encounter (Signed)
Patient called needing a handicap placard. He says he received word that one was mailed to him, but he would like to come by and pick one up instead. His pending cardiac clearance for a total knee replacement.

## 2019-10-28 NOTE — Telephone Encounter (Signed)
Second request completed

## 2019-11-04 ENCOUNTER — Telehealth: Payer: Self-pay | Admitting: Orthopedic Surgery

## 2019-11-04 NOTE — Telephone Encounter (Signed)
Patient is requesting a call back from Floris or Bertram Gala. Patient did not disclose nature of call. Patient phone number is [phone]

## 2019-11-04 NOTE — Telephone Encounter (Signed)
I called and sw pt and he is wanting to know if the [facility] has sent the information over for his surgical clearance.. would like for you to call and advise.

## 2019-11-12 ENCOUNTER — Other Ambulatory Visit: Payer: Self-pay

## 2019-11-19 ENCOUNTER — Encounter (HOSPITAL_COMMUNITY): Payer: Self-pay

## 2019-11-19 ENCOUNTER — Other Ambulatory Visit: Payer: Self-pay | Admitting: Physician Assistant

## 2019-11-19 NOTE — Progress Notes (Signed)
[id number] - [place], [place] - [address] [place] [address] Phone: [phone] Fax: [phone]  [facility] - [place], [place] - [address] [facility] [address] [place] [address] [place] [address] Phone: [phone] Fax: [phone]      Your procedure is scheduled on Wednesday, Nov 24, 2019.  Report to [facility] Main Entrance "A" at 6:30 A.M., and check in at the Admitting office.  Call this number if you have problems the morning of surgery:  [phone]  Call [phone] if you have any questions prior to your surgery date Monday-Friday 8am-4pm    Remember:  Do not eat after midnight the night before your surgery  You may drink clear liquids until 5:30 AM the morning of your surgery.   Clear liquids allowed are: Water, Non-Citrus Juices (without pulp), Carbonated Beverages, Clear Tea, Black Coffee Only, and Gatorade   Enhanced Recovery after Surgery for Orthopedics Enhanced Recovery after Surgery is a protocol used to improve the stress on your body and your recovery after surgery.  Patient Instructions  . The night before surgery:  o No food after midnight. ONLY clear liquids after midnight  . The day of surgery (if you have diabetes): o  o Drink ONE (1) Gatorade 2 (G2) as directed. o This drink was given to you during your hospital  pre-op appointment visit.  o The pre-op nurse will instruct you on the time to drink the   Gatorade 2 (G2) depending on your surgery time. By 5:30 AM o Color of the Gatorade may vary. Red is not allowed. o Nothing else to drink after completing the  Gatorade 2 (G2).         If you have questions, please contact your surgeon's office.     Take these medicines the morning of surgery with A SIP OF WATER:  amLODipine (NORVASC) levothyroxine (SYNTHROID) metoprolol (TOPROL-XL)  VENTOLIN HFA 108 - if needed  Follow your surgeon's  instructions on when to stop Eliquis.  If no instructions were given by your surgeon then you will need to call the office to get those instructions.    As of today, STOP taking any Aspirin (unless otherwise instructed by your surgeon) and Aspirin containing products, Aleve, Naproxen, Ibuprofen, Motrin, Advil, Goody's, BC's, all herbal medications, fish oil, and all vitamins.    WHAT DO I DO ABOUT MY DIABETES MEDICATION?   [name] Do not take oral diabetes medicines (metFORMIN (GLUCOPHAGE-XR)) the morning of surgery.  . Do not take glipiZIDE (GLUCOTROL) the night before surgery OR the morning of surgery.   HOW TO MANAGE YOUR DIABETES BEFORE AND AFTER SURGERY  Why is it important to control my blood sugar before and after surgery? . Improving blood sugar levels before and after surgery helps healing and can limit problems. . A way of improving blood sugar control is eating a healthy diet by: o  Eating less sugar and carbohydrates o  Increasing activity/exercise o  Talking with your doctor about reaching your blood sugar goals . High blood sugars (greater than 180 mg/dL) can raise your risk of infections and slow your recovery, so you will need to focus on controlling your diabetes during the weeks before surgery. . Make sure that the doctor who takes care of your diabetes knows about your planned surgery including the date and location.  How do I manage my blood sugar before surgery? . Check your blood sugar  at least 4 times a day, starting 2 days before surgery, to make sure that the level is not too high or low. . Check your blood sugar the morning of your surgery when you wake up and every 2 hours until you get to the Short Stay unit. o If your blood sugar is less than 70 mg/dL, you will need to treat for low blood sugar: - Do not take insulin. - Treat a low blood sugar (less than 70 mg/dL) with  cup of clear juice (cranberry or apple), 4 glucose tablets, OR glucose gel. - Recheck blood  sugar in 15 minutes after treatment (to make sure it is greater than 70 mg/dL). If your blood sugar is not greater than 70 mg/dL on recheck, call [phone] for further instructions. . Report your blood sugar to the short stay nurse when you get to Short Stay.  . If you are admitted to the hospital after surgery: o Your blood sugar will be checked by the staff and you will probably be given insulin after surgery (instead of oral diabetes medicines) to make sure you have good blood sugar levels. o The goal for blood sugar control after surgery is 80-180 mg/dL.                      Do not wear jewelry.            Do not wear lotions, powders, colognes, or deodorant.            Men may shave face and neck.            Do not bring valuables to the hospital.            [facility] is not responsible for any belongings or valuables.  Do NOT Smoke (Tobacco/Vapping) or drink Alcohol 24 hours prior to your procedure If you use a CPAP at night, you may bring all equipment for your overnight stay.   Contacts, glasses, dentures or bridgework may not be worn into surgery.      For patients admitted to the hospital, discharge time will be determined by your treatment team.   Patients discharged the day of surgery will not be allowed to drive home, and someone needs to stay with them for 24 hours.    Special instructions:   [place]- Preparing For Surgery  Before surgery, you can play an important role. Because skin is not sterile, your skin needs to be as free of germs as possible. You can reduce the number of germs on your skin by washing with CHG (chlorahexidine gluconate) Soap before surgery.  CHG is an antiseptic cleaner which kills germs and bonds with the skin to continue killing germs even after washing.    Oral Hygiene is also important to reduce your risk of infection.  Remember - BRUSH YOUR TEETH THE MORNING OF SURGERY WITH YOUR REGULAR TOOTHPASTE  Please do not use if you have an  allergy to CHG or antibacterial soaps. If your skin becomes reddened/irritated stop using the CHG.  Do not shave (including legs and underarms) for at least 48 hours prior to first CHG shower. It is OK to shave your face.  Please follow these instructions carefully.   1. Shower the NIGHT BEFORE SURGERY and the MORNING OF SURGERY with CHG Soap.   2. If you chose to wash your hair, wash your hair first as usual with your normal shampoo.  3. After you shampoo, rinse your hair and  body thoroughly to remove the shampoo.  4. Use CHG as you would any other liquid soap. You can apply CHG directly to the skin and wash gently with a scrungie or a clean washcloth.   5. Apply the CHG Soap to your body ONLY FROM THE NECK DOWN.  Do not use on open wounds or open sores. Avoid contact with your eyes, ears, mouth and genitals (private parts). Wash Face and genitals (private parts)  with your normal soap.   6. Wash thoroughly, paying special attention to the area where your surgery will be performed.  7. Thoroughly rinse your body with warm water from the neck down.  8. DO NOT shower/wash with your normal soap after using and rinsing off the CHG Soap.  9. Pat yourself dry with a CLEAN TOWEL.  10. Wear CLEAN PAJAMAS to bed the night before surgery, wear comfortable clothes the morning of surgery  11. Place CLEAN SHEETS on your bed the night of your first shower and DO NOT SLEEP WITH PETS.   Day of Surgery:   Do not apply any deodorants/lotions.  Please wear clean clothes to the hospital/surgery center.   Remember to brush your teeth WITH YOUR REGULAR TOOTHPASTE.   Please read over the following fact sheets that you were given.

## 2019-11-22 ENCOUNTER — Encounter (HOSPITAL_COMMUNITY): Payer: Self-pay

## 2019-11-22 ENCOUNTER — Other Ambulatory Visit: Payer: Self-pay

## 2019-11-22 ENCOUNTER — Other Ambulatory Visit (HOSPITAL_COMMUNITY)
Admission: RE | Admit: 2019-11-22 | Discharge: 2019-11-22 | Disposition: A | Discharge status: Home / Self Care | Payer: Medicare Other | Source: Ambulatory Visit | Attending: Orthopedic Surgery | Admitting: Orthopedic Surgery

## 2019-11-22 ENCOUNTER — Encounter (HOSPITAL_COMMUNITY)
Admission: RE | Admit: 2019-11-22 | Discharge: 2019-11-22 | Disposition: A | Discharge status: Home / Self Care | Payer: Medicare Other | Source: Ambulatory Visit | Attending: Orthopedic Surgery | Admitting: Orthopedic Surgery

## 2019-11-22 DIAGNOSIS — Z79890 Hormone replacement therapy (postmenopausal): Secondary | ICD-10-CM | POA: Diagnosis not present

## 2019-11-22 DIAGNOSIS — Z01818 Encounter for other preprocedural examination: Secondary | ICD-10-CM | POA: Diagnosis present

## 2019-11-22 DIAGNOSIS — F17200 Nicotine dependence, unspecified, uncomplicated: Secondary | ICD-10-CM | POA: Insufficient documentation

## 2019-11-22 DIAGNOSIS — G4733 Obstructive sleep apnea (adult) (pediatric): Secondary | ICD-10-CM | POA: Diagnosis not present

## 2019-11-22 DIAGNOSIS — I6521 Occlusion and stenosis of right carotid artery: Secondary | ICD-10-CM | POA: Insufficient documentation

## 2019-11-22 DIAGNOSIS — E669 Obesity, unspecified: Secondary | ICD-10-CM | POA: Diagnosis not present

## 2019-11-22 DIAGNOSIS — E119 Type 2 diabetes mellitus without complications: Secondary | ICD-10-CM | POA: Diagnosis not present

## 2019-11-22 DIAGNOSIS — Z20822 Contact with and (suspected) exposure to covid-19: Secondary | ICD-10-CM | POA: Insufficient documentation

## 2019-11-22 DIAGNOSIS — E039 Hypothyroidism, unspecified: Secondary | ICD-10-CM | POA: Insufficient documentation

## 2019-11-22 DIAGNOSIS — Z7984 Long term (current) use of oral hypoglycemic drugs: Secondary | ICD-10-CM | POA: Diagnosis not present

## 2019-11-22 DIAGNOSIS — E785 Hyperlipidemia, unspecified: Secondary | ICD-10-CM | POA: Insufficient documentation

## 2019-11-22 DIAGNOSIS — Z96652 Presence of left artificial knee joint: Secondary | ICD-10-CM | POA: Insufficient documentation

## 2019-11-22 DIAGNOSIS — Z79899 Other long term (current) drug therapy: Secondary | ICD-10-CM | POA: Diagnosis not present

## 2019-11-22 DIAGNOSIS — J439 Emphysema, unspecified: Secondary | ICD-10-CM | POA: Diagnosis not present

## 2019-11-22 DIAGNOSIS — I4891 Unspecified atrial fibrillation: Secondary | ICD-10-CM | POA: Diagnosis not present

## 2019-11-22 DIAGNOSIS — M1711 Unilateral primary osteoarthritis, right knee: Secondary | ICD-10-CM | POA: Insufficient documentation

## 2019-11-22 DIAGNOSIS — Z7901 Long term (current) use of anticoagulants: Secondary | ICD-10-CM | POA: Insufficient documentation

## 2019-11-22 DIAGNOSIS — Z6837 Body mass index (BMI) 37.0-37.9, adult: Secondary | ICD-10-CM | POA: Diagnosis not present

## 2019-11-22 DIAGNOSIS — I10 Essential (primary) hypertension: Secondary | ICD-10-CM | POA: Insufficient documentation

## 2019-11-22 HISTORY — DX: Nonrheumatic aortic (valve) stenosis: I35.0

## 2019-11-22 HISTORY — DX: Occlusion and stenosis of unspecified carotid artery: I65.29

## 2019-11-22 HISTORY — DX: Unspecified atrial fibrillation: I48.91

## 2019-11-22 LAB — BASIC METABOLIC PANEL
Anion gap: 14 (ref 5–15)
BUN: 21 mg/dL (ref 8–23)
CO2: 24 mmol/L (ref 22–32)
Calcium: 9.3 mg/dL (ref 8.9–10.3)
Chloride: 103 mmol/L (ref 98–111)
Creatinine, Ser: 0.86 mg/dL (ref 0.61–1.24)
GFR calc Af Amer: >60 mL/min (ref >60)
GFR calc non Af Amer: >60 mL/min (ref >60)
Glucose, Bld: 107 mg/dL — ABNORMAL HIGH (ref 70–99)
Potassium: 4.1 mmol/L (ref 3.5–5.1)
Sodium: 141 mmol/L (ref 135–145)

## 2019-11-22 LAB — CBC
HCT: 40.3 % (ref 39.0–52.0)
Hemoglobin: 12.9 g/dL — ABNORMAL LOW (ref 13.0–17.0)
MCH: 29.4 pg (ref 26.0–34.0)
MCHC: 32 g/dL (ref 30.0–36.0)
MCV: 91.8 fL (ref 80.0–100.0)
Platelets: 205 10*3/uL (ref 150–400)
RBC: 4.39 MIL/uL (ref 4.22–5.81)
RDW: 13.4 % (ref 11.5–15.5)
WBC: 9 10*3/uL (ref 4.0–10.5)
nRBC: 0 % (ref 0.0–0.2)

## 2019-11-22 LAB — HEMOGLOBIN A1C
Hgb A1c MFr Bld: 6.9 % — ABNORMAL HIGH (ref 4.8–5.6)
Mean Plasma Glucose: 151 mg/dL

## 2019-11-22 LAB — SURGICAL PCR SCREEN
MRSA, PCR: NEGATIVE
Staphylococcus aureus: NEGATIVE

## 2019-11-22 LAB — PROTIME-INR
INR: 1.1 (ref 0.8–1.2)
Prothrombin Time: 13.7 seconds (ref 11.4–15.2)

## 2019-11-22 LAB — SARS CORONAVIRUS 2 (TAT 6-24 HRS): SARS Coronavirus 2: NEGATIVE

## 2019-11-22 LAB — GLUCOSE, CAPILLARY: Glucose-Capillary: 99 mg/dL (ref 70–99)

## 2019-11-22 NOTE — Progress Notes (Addendum)
Anesthesia Chart Review:  Case: [id number] Date/Time: 11/24/19 0815   Procedure: RIGHT TOTAL KNEE ARTHROPLASTY (Right Knee)   Anesthesia type: Choice   Pre-op diagnosis: Osteoarthritis Right Knee   Location: [facility] OR ROOM 03 / [place] [facility]   Surgeons: Newt Minion, MD      DISCUSSION: Patient is a 74 year old male scheduled for the above procedure.  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 BMI is consistent with obesity.   Patient was referred to cardiologist Geraldo Pitter, MD with [facility] earlier this year for new onset afib, chest pain, and murmur. He was initially seen on 09/23/19. She also notes moderate right carotid stenosis on 02/2018 Duplex and mild AS on 04/2016 echo,. She ordered a stress test, echo, carotid US, and referred him to vascular surgery. See results outlined below. He was asymptomatic from afib, so for now treated with anticoagulation and rate control. Dr. Talbert Cage did sign a note of cardiac clearance on 11/04/19. Recommendations includes continuing metoprolol and statin and okay to hold Eliquis 3 days and aspirin 7 days before surgery and resume next day if no bleeding issues.  Reportedly last Eliquis 11/20/2019 and last Aggrenox 11/17/2019.  His 11/22/19 EKG appears similar to 09/23/19 tracing from [facility]. Preoperative labs acceptable. 11/22/2019 preoperative COVID-19 negative. Anesthesia team to evaluate on the day of surgery. Of note, patient's wife Katharine Look assisted with history at patient request.    VS: BP (!) 146/85   Pulse 63   Temp 36.7 C (Oral)   Resp 18   Ht '5\' 10"'  (1.778 m)   Wt 118.5 kg   SpO2 98%   BMI 37.49 kg/m   PROVIDERS: Heywood Bene, PA-C is listed as PCP. He reported seeing Mardella Layman, MD at  the [facility]. Geraldo Pitter, MID is cardiologist [facility])   LABS: Labs reviewed: Acceptable for surgery.  (all labs ordered are listed, but only abnormal results are displayed)  Labs Reviewed  HEMOGLOBIN A1C - Abnormal; Notable for the following components:      Result Value   Hgb A1c MFr Bld 6.9 (*)    All other components within normal limits  BASIC METABOLIC PANEL - Abnormal; Notable for the following components:   Glucose, Bld 107 (*)    All other components within normal limits  CBC - Abnormal; Notable for the following components:   Hemoglobin 12.9 (*)    All other components within normal limits  SURGICAL PCR SCREEN  GLUCOSE, CAPILLARY  PROTIME-INR     IMAGES: MRI Right knee 10/17/19: IMPRESSION: 1. Lateral meniscal degeneration with complex radial tear of the posterior root attachment. 2. Medial meniscal degeneration and tearing. 3. Moderate-to-severe tricompartmental osteoarthritis. 4. Small knee joint effusion.   EKG:  EKG 11/22/19:  Ventricular rate 70 bpm Undetermined rhythm Left axis deviation Inferior infarct , age undetermined Anterior infarct , age undetermined Abnormal ECG P waves not identifiable compared to 2016 ? slow afib Confirmed by [place] [phone]) on 11/22/2019 3:12:53 PM - Occasional PVCs are also present. Tracing appears similar to 09/23/19 from the [facility]  EKG 09/23/19 [facility]): Ventricular rate 70 bpm Undetermined rhythm   Probably atrial fibrillation Left axis deviation Inferior infarct, age undetermined Anterior infarct (cited on or before 07/13/2019) Abnormal ECG   CV: Carotid US 11/03/19 [facility], as outlined in 09/23/19 [facility] cardiology office note by Dr. Talbert Cage) Impression:  1.  Atherosclerotic plaque bilaterally with findings consistent with 50 to 69% stenosis in the right internal carotid artery.  This is stable compared with prior examination. (Comparison 11/16/13: 60-63% RICA, 0-16% LICA;  0/1093: 23-55% RICA, no hemodynamically significant LICA stenosis)   Nuclear stress test 10/19/19 ([facility]): Impression: 1.  No evidence of inducible ischemia. 2.  Left ventricular enlargement with a calculated end-diastolic volume of 732 mL.   Echo 09/30/19 ([facility]): Summary: Study quality: Fair The patient was in rate controlled atrial fibrillation throughout the study. There is moderate concentric left ventricular hypertrophy. Left ventricular systolic function is normal.  No regional wall motion abnormalities noted.  Diastolic function not well evaluated due to arrhythmia. Ejection fraction 60 to 65% by visual estimation.  EF 65% by 2D BP method. The right ventricle is normal in size and function. There is moderate biatrial enlargement. There is moderate aortic sclerosis. Mild to moderate valvular aortic stenosis. MaxVel 308 cm/s, MeanPGt 22 mmHg, AVA 1.6 cm. Trace (trivial) aortic regurgitation. There is moderate mitral annular calcification without significant stenosis. There is trace mitral regurgitation. Right ventricular systolic pressure is not well evaluated due to lack of TR. The aortic root is normal size. Compared to the last study dated 05/07/2016, the degree of aortic stenosis has progressed to the mild-moderate range.   Cardiac event monitor 06/11/13-07/10/13: Sinus rhythm with PACs.  No sustained A. Fib.   Past Medical History:  Diagnosis Date  . Aortic stenosis    09/30/19 echo ([facility], Cardiologist Dr. Geraldo Pitter): Mild-moderate AS, MaxVel 308 cm/s, MeanPG 22 mmHg, AVA 1.6 cm2  . Arthritis   . Atrial fibrillation ([place])   . Carotid artery stenosis    11/03/19 [place] ([facility]): 50-69% RICA stenosis  . Cholecystitis with cholelithiasis   . Eczema   . Emphysema of lung ([place])   . Gallstones and inflammation of gallbladder without obstruction   . Heart murmur   . History of nuclear stress test 02/23/2010   dipyridamole; normal  pattern of perfusion in all regions; normal, low risk study   . Hyperlipidemia   . Hypertension   . 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 Meningoencephalitis   . Obesity   . OSA on CPAP    last sleep study >8 yrs  . Pneumonia    "twice" (05/19/2013)  . Shortness of breath   . Stroke [facility])    "they said I had a minor one when I had menigitis" (05/19/2013)  . Thyroid disease   . Tobacco abuse    quit 11/08/2012  . Type II diabetes mellitus ([place])   . Umbilical hernia     Past Surgical History:  Procedure Laterality Date  . CHOLECYSTECTOMY  09/09/2011   Procedure: LAPAROSCOPIC CHOLECYSTECTOMY WITH INTRAOPERATIVE CHOLANGIOGRAM;  Surgeon: Belva Crome, MD;  Location: [place];  Service: General;  Laterality: N/A;  . HERNIA REPAIR  04/2006   UHR  . JOINT REPLACEMENT    . KNEE ARTHROSCOPY Right    "w2 times" (05/19/2013)  . TONSILLECTOMY    . TOTAL KNEE ARTHROPLASTY Left 05/19/2013  . TOTAL KNEE ARTHROPLASTY Left 05/19/2013   Procedure: TOTAL KNEE ARTHROPLASTY;  Surgeon: Newt Minion, MD;  Location: [place];  Service: Orthopedics;  Laterality: Left;  Left Total Knee Arthroplasty  . TRANSTHORACIC ECHOCARDIOGRAM  03/28/2005   EF=>55%, mod conc LVH; LA mod dilated & borderline RA enlargement; mild TR; mild pulm valve regurg    MEDICATIONS: . amLODipine (NORVASC) 10 MG tablet  .  apixaban (ELIQUIS) 5 MG TABS tablet  . atorvastatin (LIPITOR) 40 MG tablet  . Blood Glucose Monitoring Suppl (ONETOUCH VERIO FLEX SYSTEM) W/DEVICE KIT  . cholecalciferol (VITAMIN D3) 25 MCG (1000 UNIT) tablet  . dipyridamole-aspirin (AGGRENOX) 200-25 MG 12hr capsule  . glipiZIDE (GLUCOTROL) 10 MG tablet  . glucose blood (ONETOUCH VERIO) test strip  . hydrochlorothiazide (HYDRODIURIL) 25 MG tablet  . levothyroxine (SYNTHROID) 112 MCG tablet  . losartan (COZAAR) 100 MG tablet  . metFORMIN (GLUCOPHAGE-XR) 500 MG 24 hr tablet  . metoprolol (TOPROL-XL) 200 MG 24 hr tablet  .  Multiple Vitamins-Minerals (MULTIVITAMIN WITH MINERALS) tablet  . Multiple Vitamins-Minerals (PRESERVISION AREDS 2+MULTI VIT PO)  . ONETOUCH DELICA LANCETS FINE MISC  . VENTOLIN HFA 108 (90 Base) MCG/ACT inhaler   No current facility-administered medications for this encounter.    Myra Gianotti, PA-C Surgical Short Stay/Anesthesiology [facility] Phone [phone] [facility] Phone [phone] 11/23/2019 11:04 AM

## 2019-11-22 NOTE — Progress Notes (Signed)
PCP - Alphonsa Gin, MD [facility]) Cardiologist - Janyth Contes, MD [facility])   PPM/ICD - n/a   Chest x-ray - n/a EKG - 11/22/19 Stress Test - 11/16/13 ECHO - 02/23/10 Cardiac Cath - pt denies  Sleep Study - yes CPAP - yes - pt to bring CPAP mask/tubing DOS  Fasting Blood Sugar - 80s-90s Checks Blood Sugar 1x/week (sometimes less)   Blood Thinner Instructions: Follow your surgeon's instructions on when to stop Eliquis.  If no instructions were given by your surgeon then you will need to call the office to get those instructions.  Pt last dose Eliquis 11/20/19  Aspirin Instructions: pt stopped dipyridamole-aspirin (AGGRENOX) 11/17/19  ERAS Protcol - yes PRE-SURGERY Ensure or G2- G2 provided  COVID TEST- 11/22/19   Coronavirus Screening  Have you experienced the following symptoms:  Cough yes/no: No Fever (>100.50F)  yes/no: No Runny nose yes/no: No Sore throat yes/no: No Difficulty breathing/shortness of breath  yes/no: No  Have you or a family member traveled in the last 14 days and where? yes/no: No   If the patient indicates "YES" to the above questions, their PAT will be rescheduled to limit the exposure to others and, the surgeon will be notified. THE PATIENT WILL NEED TO BE ASYMPTOMATIC FOR 14 DAYS.   If the patient is not experiencing any of these symptoms, the PAT nurse will instruct them to NOT bring anyone with them to their appointment since they may have these symptoms or traveled as well.   Please remind your patients and families that hospital visitation restrictions are in effect and the importance of the restrictions.     Anesthesia review: cardiac hx  Patient denies shortness of breath, fever, cough and chest pain at PAT appointment   All instructions explained to the patient, with a verbal understanding of the material. Patient agrees to go over the instructions while at home for a better understanding. Patient also instructed to self  quarantine after being tested for COVID-19. The opportunity to ask questions was provided.

## 2019-11-23 ENCOUNTER — Encounter (HOSPITAL_COMMUNITY): Payer: Self-pay

## 2019-11-23 MED ORDER — DEXTROSE 5 % IV SOLN
3.0000 g | INTRAVENOUS | Status: AC
  Administered 2019-11-24: 3 g via INTRAVENOUS
  Filled 2019-11-23: qty 3000
  Filled 2019-11-23: qty 3

## 2019-11-23 NOTE — Anesthesia Preprocedure Evaluation (Addendum)
Anesthesia Evaluation  Patient identified by MRN, date of birth, ID band Patient awake    Reviewed: Allergy & Precautions, NPO status , Patient's Chart, lab work & pertinent test results  Airway Mallampati: III  TM Distance: >3 FB Neck ROM: Full    Dental  (+) Teeth Intact, Dental Advisory Given   Pulmonary Current Smoker and Patient abstained from smoking.,    breath sounds clear to auscultation       Cardiovascular hypertension,  Rhythm:Regular Rate:Normal     Neuro/Psych    GI/Hepatic   Endo/Other  diabetes  Renal/GU      Musculoskeletal   Abdominal (+) + obese,   Peds  Hematology   Anesthesia Other Findings   Reproductive/Obstetrics                             Anesthesia Physical Anesthesia Plan  ASA: III and emergent  Anesthesia Plan: General   Post-op Pain Management:  Regional for Post-op pain   Induction: Intravenous  PONV Risk Score and Plan: Ondansetron  Airway Management Planned: Oral ETT  Additional Equipment:   Intra-op Plan:   Post-operative Plan: Extubation in OR  Informed Consent: I have reviewed the patients History and Physical, chart, labs and discussed the procedure including the risks, benefits and alternatives for the proposed anesthesia with the patient or authorized representative who has indicated his/her understanding and acceptance.     Dental advisory given  Plan Discussed with: CRNA and Anesthesiologist  Anesthesia Plan Comments: (PAT note written 11/23/2019 by Shonna Chock, PA-C. )       Anesthesia Quick Evaluation

## 2019-11-24 ENCOUNTER — Inpatient Hospital Stay (HOSPITAL_COMMUNITY): Payer: Medicare Other | Admitting: Anesthesiology

## 2019-11-24 ENCOUNTER — Inpatient Hospital Stay (HOSPITAL_COMMUNITY): Payer: Medicare Other | Admitting: Vascular Surgery

## 2019-11-24 ENCOUNTER — Observation Stay (HOSPITAL_COMMUNITY)
Admission: RE | Admit: 2019-11-24 | Discharge: 2019-11-25 | Disposition: A | Discharge status: Home / Self Care | Payer: Medicare Other | Attending: Orthopedic Surgery | Admitting: Orthopedic Surgery

## 2019-11-24 ENCOUNTER — Encounter (HOSPITAL_COMMUNITY)
Admission: RE | Disposition: A | Discharge status: Home / Self Care | Payer: Self-pay | Source: Home / Self Care | Attending: Orthopedic Surgery

## 2019-11-24 ENCOUNTER — Encounter (HOSPITAL_COMMUNITY): Payer: Self-pay | Admitting: Orthopedic Surgery

## 2019-11-24 ENCOUNTER — Other Ambulatory Visit: Payer: Self-pay

## 2019-11-24 DIAGNOSIS — I6529 Occlusion and stenosis of unspecified carotid artery: Secondary | ICD-10-CM | POA: Insufficient documentation

## 2019-11-24 DIAGNOSIS — I4891 Unspecified atrial fibrillation: Secondary | ICD-10-CM | POA: Diagnosis not present

## 2019-11-24 DIAGNOSIS — Z6837 Body mass index (BMI) 37.0-37.9, adult: Secondary | ICD-10-CM | POA: Diagnosis not present

## 2019-11-24 DIAGNOSIS — E119 Type 2 diabetes mellitus without complications: Secondary | ICD-10-CM | POA: Insufficient documentation

## 2019-11-24 DIAGNOSIS — F17210 Nicotine dependence, cigarettes, uncomplicated: Secondary | ICD-10-CM | POA: Insufficient documentation

## 2019-11-24 DIAGNOSIS — M1711 Unilateral primary osteoarthritis, right knee: Secondary | ICD-10-CM | POA: Diagnosis present

## 2019-11-24 DIAGNOSIS — E039 Hypothyroidism, unspecified: Secondary | ICD-10-CM | POA: Diagnosis not present

## 2019-11-24 DIAGNOSIS — I872 Venous insufficiency (chronic) (peripheral): Secondary | ICD-10-CM | POA: Insufficient documentation

## 2019-11-24 DIAGNOSIS — G4733 Obstructive sleep apnea (adult) (pediatric): Secondary | ICD-10-CM | POA: Diagnosis not present

## 2019-11-24 DIAGNOSIS — J439 Emphysema, unspecified: Secondary | ICD-10-CM | POA: Insufficient documentation

## 2019-11-24 DIAGNOSIS — Z923 Personal history of irradiation: Secondary | ICD-10-CM | POA: Diagnosis not present

## 2019-11-24 DIAGNOSIS — Z885 Allergy status to narcotic agent status: Secondary | ICD-10-CM | POA: Diagnosis not present

## 2019-11-24 DIAGNOSIS — E669 Obesity, unspecified: Secondary | ICD-10-CM | POA: Insufficient documentation

## 2019-11-24 DIAGNOSIS — I350 Nonrheumatic aortic (valve) stenosis: Secondary | ICD-10-CM | POA: Insufficient documentation

## 2019-11-24 DIAGNOSIS — Z8349 Family history of other endocrine, nutritional and metabolic diseases: Secondary | ICD-10-CM | POA: Diagnosis not present

## 2019-11-24 DIAGNOSIS — Z96652 Presence of left artificial knee joint: Secondary | ICD-10-CM | POA: Insufficient documentation

## 2019-11-24 DIAGNOSIS — Z8673 Personal history of transient ischemic attack (TIA), and cerebral infarction without residual deficits: Secondary | ICD-10-CM | POA: Insufficient documentation

## 2019-11-24 DIAGNOSIS — I10 Essential (primary) hypertension: Secondary | ICD-10-CM | POA: Diagnosis not present

## 2019-11-24 DIAGNOSIS — M25761 Osteophyte, right knee: Secondary | ICD-10-CM | POA: Insufficient documentation

## 2019-11-24 HISTORY — PX: TOTAL KNEE ARTHROPLASTY: SHX125

## 2019-11-24 LAB — GLUCOSE, CAPILLARY
Glucose-Capillary: 95 mg/dL (ref 70–99)
Glucose-Capillary: 233 mg/dL — ABNORMAL HIGH (ref 70–99)
Glucose-Capillary: 258 mg/dL — ABNORMAL HIGH (ref 70–99)

## 2019-11-24 SURGERY — ARTHROPLASTY, KNEE, TOTAL
Anesthesia: General | Site: Knee | Laterality: Right

## 2019-11-24 MED ORDER — HYDROCHLOROTHIAZIDE 25 MG PO TABS
12.5000 mg | ORAL_TABLET | Freq: Every day | ORAL | Status: DC
  Administered 2019-11-24 – 2019-11-25 (×2): 12.5 mg via ORAL
  Filled 2019-11-24 (×2): qty 1

## 2019-11-24 MED ORDER — CEFAZOLIN SODIUM-DEXTROSE 2-4 GM/100ML-% IV SOLN
2.0000 g | Freq: Four times a day (QID) | INTRAVENOUS | Status: AC
  Administered 2019-11-24 (×2): 2 g via INTRAVENOUS
  Filled 2019-11-24 (×2): qty 100

## 2019-11-24 MED ORDER — TRANEXAMIC ACID-NACL 1000-0.7 MG/100ML-% IV SOLN
INTRAVENOUS | Status: DC | PRN
  Administered 2019-11-24: 1000 mg via INTRAVENOUS

## 2019-11-24 MED ORDER — ATORVASTATIN CALCIUM 40 MG PO TABS
40.0000 mg | ORAL_TABLET | Freq: Every day | ORAL | Status: DC
  Administered 2019-11-24: 40 mg via ORAL
  Filled 2019-11-24: qty 1

## 2019-11-24 MED ORDER — ONDANSETRON HCL 4 MG/2ML IJ SOLN
4.0000 mg | Freq: Once | INTRAMUSCULAR | Status: DC | PRN
Start: 2019-11-24 — End: 2019-11-24

## 2019-11-24 MED ORDER — ONDANSETRON HCL 4 MG/2ML IJ SOLN
INTRAMUSCULAR | Status: DC | PRN
  Administered 2019-11-24: 4 mg via INTRAVENOUS

## 2019-11-24 MED ORDER — STERILE WATER FOR IRRIGATION IR SOLN
Status: DC | PRN
  Administered 2019-11-24: 1000 mL

## 2019-11-24 MED ORDER — MIDAZOLAM HCL 5 MG/5ML IJ SOLN
INTRAMUSCULAR | Status: DC | PRN
  Administered 2019-11-24 (×2): 1 mg via INTRAVENOUS

## 2019-11-24 MED ORDER — ONDANSETRON HCL 4 MG/2ML IJ SOLN
4.0000 mg | Freq: Four times a day (QID) | INTRAMUSCULAR | Status: DC | PRN
  Administered 2019-11-24: 4 mg via INTRAVENOUS
  Filled 2019-11-24: qty 2

## 2019-11-24 MED ORDER — ACETAMINOPHEN 10 MG/ML IV SOLN
INTRAVENOUS | Status: AC
  Filled 2019-11-24: qty 100

## 2019-11-24 MED ORDER — METOCLOPRAMIDE HCL 5 MG/ML IJ SOLN: 5.0000 mg | Freq: Three times a day (TID) | INTRAMUSCULAR | Status: DC | PRN

## 2019-11-24 MED ORDER — TRANEXAMIC ACID 1000 MG/10ML IV SOLN
2000.0000 mg | Freq: Once | INTRAVENOUS | Status: DC
  Filled 2019-11-24: qty 20

## 2019-11-24 MED ORDER — METHOCARBAMOL 500 MG PO TABS: 500.0000 mg | ORAL_TABLET | Freq: Four times a day (QID) | ORAL | Status: DC | PRN

## 2019-11-24 MED ORDER — KETOROLAC TROMETHAMINE 15 MG/ML IJ SOLN
INTRAMUSCULAR | Status: AC
  Administered 2019-11-24: 7.5 mg via INTRAVENOUS
  Filled 2019-11-24: qty 1

## 2019-11-24 MED ORDER — VITAMIN D 25 MCG (1000 UNIT) PO TABS
1000.0000 [IU] | ORAL_TABLET | Freq: Every day | ORAL | Status: DC
  Administered 2019-11-24 – 2019-11-25 (×2): 1000 [IU] via ORAL
  Filled 2019-11-24 (×2): qty 1

## 2019-11-24 MED ORDER — OXYCODONE HCL 5 MG PO TABS
5.0000 mg | ORAL_TABLET | ORAL | Status: DC | PRN
  Administered 2019-11-24 – 2019-11-25 (×3): 10 mg via ORAL
  Filled 2019-11-24 (×3): qty 2

## 2019-11-24 MED ORDER — METHOCARBAMOL 1000 MG/10ML IJ SOLN
500.0000 mg | Freq: Four times a day (QID) | INTRAVENOUS | Status: DC | PRN
  Filled 2019-11-24: qty 5

## 2019-11-24 MED ORDER — FENTANYL CITRATE (PF) 100 MCG/2ML IJ SOLN
25.0000 ug | INTRAMUSCULAR | Status: DC | PRN
Start: 2019-11-24 — End: 2019-11-24

## 2019-11-24 MED ORDER — ACETAMINOPHEN 10 MG/ML IV SOLN
INTRAVENOUS | Status: DC | PRN
  Administered 2019-11-24: 1000 mg via INTRAVENOUS

## 2019-11-24 MED ORDER — DEXAMETHASONE SODIUM PHOSPHATE 10 MG/ML IJ SOLN
INTRAMUSCULAR | Status: DC | PRN
  Administered 2019-11-24: 4 mg via INTRAVENOUS

## 2019-11-24 MED ORDER — PHENYLEPHRINE 40 MCG/ML (10ML) SYRINGE FOR IV PUSH (FOR BLOOD PRESSURE SUPPORT)
PREFILLED_SYRINGE | INTRAVENOUS | Status: AC
  Filled 2019-11-24: qty 10

## 2019-11-24 MED ORDER — METHOCARBAMOL 500 MG PO TABS
ORAL_TABLET | ORAL | Status: AC
  Administered 2019-11-24: 500 mg via ORAL
  Filled 2019-11-24: qty 1

## 2019-11-24 MED ORDER — ROCURONIUM BROMIDE 10 MG/ML (PF) SYRINGE
PREFILLED_SYRINGE | INTRAVENOUS | Status: AC
  Filled 2019-11-24: qty 10

## 2019-11-24 MED ORDER — ONDANSETRON HCL 4 MG/2ML IJ SOLN: 4.0000 mg | Freq: Once | INTRAMUSCULAR | Status: DC | PRN

## 2019-11-24 MED ORDER — DEXAMETHASONE SODIUM PHOSPHATE 10 MG/ML IJ SOLN
INTRAMUSCULAR | Status: AC
  Filled 2019-11-24: qty 1

## 2019-11-24 MED ORDER — SUGAMMADEX SODIUM 200 MG/2ML IV SOLN
INTRAVENOUS | Status: DC | PRN
  Administered 2019-11-24: 300 mg via INTRAVENOUS

## 2019-11-24 MED ORDER — METOCLOPRAMIDE HCL 5 MG PO TABS: 5.0000 mg | ORAL_TABLET | Freq: Three times a day (TID) | ORAL | Status: DC | PRN

## 2019-11-24 MED ORDER — PHENOL 1.4 % MT LIQD: 1.0000 | OROMUCOSAL | Status: DC | PRN

## 2019-11-24 MED ORDER — SODIUM CHLORIDE 0.9 % IV SOLN: INTRAVENOUS | Status: DC

## 2019-11-24 MED ORDER — TRANEXAMIC ACID-NACL 1000-0.7 MG/100ML-% IV SOLN
INTRAVENOUS | Status: AC
  Filled 2019-11-24: qty 100

## 2019-11-24 MED ORDER — PROPOFOL 10 MG/ML IV BOLUS
INTRAVENOUS | Status: AC
  Filled 2019-11-24: qty 20

## 2019-11-24 MED ORDER — TRANEXAMIC ACID 1000 MG/10ML IV SOLN
INTRAVENOUS | Status: DC | PRN
  Administered 2019-11-24: 2000 mg via TOPICAL

## 2019-11-24 MED ORDER — DOCUSATE SODIUM 100 MG PO CAPS
100.0000 mg | ORAL_CAPSULE | Freq: Two times a day (BID) | ORAL | Status: DC
  Administered 2019-11-24: 100 mg via ORAL
  Filled 2019-11-24 (×2): qty 1

## 2019-11-24 MED ORDER — MIDAZOLAM HCL 2 MG/2ML IJ SOLN
INTRAMUSCULAR | Status: AC
  Filled 2019-11-24: qty 2

## 2019-11-24 MED ORDER — LIDOCAINE 2% (20 MG/ML) 5 ML SYRINGE
INTRAMUSCULAR | Status: DC | PRN
  Administered 2019-11-24: 40 mg via INTRAVENOUS

## 2019-11-24 MED ORDER — LACTATED RINGERS IV SOLN: INTRAVENOUS | Status: DC

## 2019-11-24 MED ORDER — ROCURONIUM BROMIDE 10 MG/ML (PF) SYRINGE
PREFILLED_SYRINGE | INTRAVENOUS | Status: DC | PRN
  Administered 2019-11-24: 30 mg via INTRAVENOUS
  Administered 2019-11-24: 60 mg via INTRAVENOUS

## 2019-11-24 MED ORDER — PHENYLEPHRINE HCL-NACL 10-0.9 MG/250ML-% IV SOLN
INTRAVENOUS | Status: DC | PRN
  Administered 2019-11-24: 20 ug/min via INTRAVENOUS

## 2019-11-24 MED ORDER — ASPIRIN-DIPYRIDAMOLE ER 25-200 MG PO CP12
1.0000 | ORAL_CAPSULE | Freq: Two times a day (BID) | ORAL | Status: DC
  Administered 2019-11-24 – 2019-11-25 (×2): 1 via ORAL
  Filled 2019-11-24 (×3): qty 1

## 2019-11-24 MED ORDER — FENTANYL CITRATE (PF) 100 MCG/2ML IJ SOLN: 25.0000 ug | INTRAMUSCULAR | Status: DC | PRN

## 2019-11-24 MED ORDER — FENTANYL CITRATE (PF) 250 MCG/5ML IJ SOLN
INTRAMUSCULAR | Status: AC
  Filled 2019-11-24: qty 5

## 2019-11-24 MED ORDER — KETOROLAC TROMETHAMINE 15 MG/ML IJ SOLN
7.5000 mg | Freq: Four times a day (QID) | INTRAMUSCULAR | Status: AC
  Administered 2019-11-24 – 2019-11-25 (×3): 7.5 mg via INTRAVENOUS
  Filled 2019-11-24 (×3): qty 1

## 2019-11-24 MED ORDER — APIXABAN 5 MG PO TABS
5.0000 mg | ORAL_TABLET | Freq: Two times a day (BID) | ORAL | Status: DC
  Administered 2019-11-24 – 2019-11-25 (×2): 5 mg via ORAL
  Filled 2019-11-24 (×2): qty 1

## 2019-11-24 MED ORDER — HYDROMORPHONE HCL 1 MG/ML IJ SOLN
INTRAMUSCULAR | Status: AC
  Filled 2019-11-24: qty 0.5

## 2019-11-24 MED ORDER — METFORMIN HCL ER 500 MG PO TB24
1000.0000 mg | ORAL_TABLET | Freq: Two times a day (BID) | ORAL | Status: DC
  Administered 2019-11-24 – 2019-11-25 (×2): 1000 mg via ORAL
  Filled 2019-11-24 (×3): qty 2

## 2019-11-24 MED ORDER — ONDANSETRON HCL 4 MG PO TABS
4.0000 mg | ORAL_TABLET | Freq: Four times a day (QID) | ORAL | Status: DC | PRN
  Administered 2019-11-24: 4 mg via ORAL
  Filled 2019-11-24: qty 1

## 2019-11-24 MED ORDER — GLIPIZIDE 5 MG PO TABS
10.0000 mg | ORAL_TABLET | Freq: Two times a day (BID) | ORAL | Status: DC
  Administered 2019-11-24 – 2019-11-25 (×2): 10 mg via ORAL
  Filled 2019-11-24 (×2): qty 2

## 2019-11-24 MED ORDER — LOSARTAN POTASSIUM 50 MG PO TABS
100.0000 mg | ORAL_TABLET | Freq: Every day | ORAL | Status: DC
  Administered 2019-11-24 – 2019-11-25 (×2): 100 mg via ORAL
  Filled 2019-11-24 (×2): qty 2

## 2019-11-24 MED ORDER — 0.9 % SODIUM CHLORIDE (POUR BTL) OPTIME
TOPICAL | Status: DC | PRN
  Administered 2019-11-24: 1000 mL

## 2019-11-24 MED ORDER — LEVOTHYROXINE SODIUM 112 MCG PO TABS
224.0000 ug | ORAL_TABLET | Freq: Every day | ORAL | Status: DC
  Administered 2019-11-25: 224 ug via ORAL
  Filled 2019-11-24: qty 2

## 2019-11-24 MED ORDER — LIDOCAINE 2% (20 MG/ML) 5 ML SYRINGE
INTRAMUSCULAR | Status: AC
  Filled 2019-11-24: qty 5

## 2019-11-24 MED ORDER — ONDANSETRON HCL 4 MG/2ML IJ SOLN
INTRAMUSCULAR | Status: AC
  Filled 2019-11-24: qty 2

## 2019-11-24 MED ORDER — ACETAMINOPHEN 325 MG PO TABS: 325.0000 mg | ORAL_TABLET | Freq: Four times a day (QID) | ORAL | Status: DC | PRN

## 2019-11-24 MED ORDER — MENTHOL 3 MG MT LOZG: 1.0000 | LOZENGE | OROMUCOSAL | Status: DC | PRN

## 2019-11-24 MED ORDER — FENTANYL CITRATE (PF) 250 MCG/5ML IJ SOLN
INTRAMUSCULAR | Status: DC | PRN
  Administered 2019-11-24: 100 ug via INTRAVENOUS
  Administered 2019-11-24 (×3): 50 ug via INTRAVENOUS

## 2019-11-24 MED ORDER — ETOMIDATE 2 MG/ML IV SOLN
INTRAVENOUS | Status: AC
  Filled 2019-11-24: qty 10

## 2019-11-24 MED ORDER — CEFAZOLIN SODIUM-DEXTROSE 2-4 GM/100ML-% IV SOLN: 2.0000 g | Freq: Four times a day (QID) | INTRAVENOUS | Status: DC

## 2019-11-24 MED ORDER — ALBUTEROL SULFATE (2.5 MG/3ML) 0.083% IN NEBU: 3.0000 mL | INHALATION_SOLUTION | RESPIRATORY_TRACT | Status: DC | PRN

## 2019-11-24 MED ORDER — HYDROMORPHONE HCL 1 MG/ML IJ SOLN: 0.5000 mg | INTRAMUSCULAR | Status: DC | PRN

## 2019-11-24 MED ORDER — OXYCODONE HCL 5 MG PO TABS
ORAL_TABLET | ORAL | Status: AC
  Administered 2019-11-24: 10 mg via ORAL
  Filled 2019-11-24: qty 2

## 2019-11-24 MED ORDER — METOPROLOL SUCCINATE ER 50 MG PO TB24
100.0000 mg | ORAL_TABLET | Freq: Every day | ORAL | Status: DC
  Administered 2019-11-25: 100 mg via ORAL
  Filled 2019-11-24: qty 2

## 2019-11-24 MED ORDER — AMLODIPINE BESYLATE 10 MG PO TABS
10.0000 mg | ORAL_TABLET | Freq: Every day | ORAL | Status: DC
  Administered 2019-11-25: 10 mg via ORAL
  Filled 2019-11-24: qty 1

## 2019-11-24 MED ORDER — SODIUM CHLORIDE 0.9 % IR SOLN
Status: DC | PRN
  Administered 2019-11-24 (×2): 3000 mL

## 2019-11-24 MED ORDER — HYDROMORPHONE HCL 1 MG/ML IJ SOLN
INTRAMUSCULAR | Status: DC | PRN
  Administered 2019-11-24 (×2): .5 mg via INTRAVENOUS

## 2019-11-24 MED ORDER — INSULIN ASPART 100 UNIT/ML ~~LOC~~ SOLN
0.0000 [IU] | Freq: Three times a day (TID) | SUBCUTANEOUS | Status: DC
  Administered 2019-11-24: 5 [IU] via SUBCUTANEOUS
  Administered 2019-11-25: 3 [IU] via SUBCUTANEOUS

## 2019-11-24 SURGICAL SUPPLY — 54 items
ATTUNE MED DOME PAT 38 KNEE (Knees) ×1 IMPLANT
ATTUNE PS FEM RT SZ 8 CEM KNEE (Femur) ×1 IMPLANT
BASEPLATE TIB CMT FB PCKT SZ 8 (Knees) ×1 IMPLANT
BLADE SAGITTAL 25.0X1.19X90 (BLADE) ×2 IMPLANT
BLADE SAW SGTL 13X75X1.27 (BLADE) ×2 IMPLANT
BLADE SURG 21 STRL SS (BLADE) ×4 IMPLANT
BNDG COHESIVE 6X5 TAN STRL LF (GAUZE/BANDAGES/DRESSINGS) ×4 IMPLANT
BNDG GAUZE ELAST 4 BULKY (GAUZE/BANDAGES/DRESSINGS) ×2 IMPLANT
BOWL SMART MIX CTS (DISPOSABLE) ×2 IMPLANT
BSPLAT TIB 8 CMNT FXBRNG STRL (Knees) ×1 IMPLANT
CEMENT BONE R 1X40 (Cement) ×4 IMPLANT
COVER SURGICAL LIGHT HANDLE (MISCELLANEOUS) ×2 IMPLANT
COVER WAND RF STERILE (DRAPES) ×1 IMPLANT
CUFF TOURN SGL QUICK 34 (TOURNIQUET CUFF) ×2
CUFF TOURN SGL QUICK 42 (TOURNIQUET CUFF) IMPLANT
CUFF TRNQT CYL 34X4.125X (TOURNIQUET CUFF) ×1 IMPLANT
DRAPE EXTREMITY T 121X128X90 (DISPOSABLE) ×2 IMPLANT
DRAPE HALF SHEET 40X57 (DRAPES) ×4 IMPLANT
DRAPE U-SHAPE 47X51 STRL (DRAPES) ×2 IMPLANT
DRSG ADAPTIC 3X8 NADH LF (GAUZE/BANDAGES/DRESSINGS) ×2 IMPLANT
DRSG PAD ABDOMINAL 8X10 ST (GAUZE/BANDAGES/DRESSINGS) ×2 IMPLANT
DURAPREP 26ML APPLICATOR (WOUND CARE) ×2 IMPLANT
ELECT REM PT RETURN 9FT ADLT (ELECTROSURGICAL) ×2
ELECTRODE REM PT RTRN 9FT ADLT (ELECTROSURGICAL) ×1 IMPLANT
FACESHIELD WRAPAROUND (MASK) ×2 IMPLANT
FACESHIELD WRAPAROUND OR TEAM (MASK) ×1 IMPLANT
GAUZE SPONGE 4X4 12PLY STRL (GAUZE/BANDAGES/DRESSINGS) ×2 IMPLANT
GLOVE BIOGEL PI IND STRL 9 (GLOVE) ×1 IMPLANT
GLOVE BIOGEL PI INDICATOR 9 (GLOVE) ×1
GLOVE SURG ORTHO 9.0 STRL STRW (GLOVE) ×2 IMPLANT
GOWN STRL REUS W/ TWL XL LVL3 (GOWN DISPOSABLE) ×2 IMPLANT
GOWN STRL REUS W/TWL XL LVL3 (GOWN DISPOSABLE) ×4
HANDPIECE INTERPULSE COAX TIP (DISPOSABLE) ×4
INSERT TIBIA FIXED BEARING SZ8 (Insert) ×1 IMPLANT
KIT BASIN OR (CUSTOM PROCEDURE TRAY) ×2 IMPLANT
KIT TURNOVER KIT B (KITS) ×2 IMPLANT
MANIFOLD NEPTUNE II (INSTRUMENTS) ×2 IMPLANT
NS IRRIG 1000ML POUR BTL (IV SOLUTION) ×2 IMPLANT
PACK TOTAL JOINT (CUSTOM PROCEDURE TRAY) ×2 IMPLANT
PAD ARMBOARD 7.5X6 YLW CONV (MISCELLANEOUS) ×2 IMPLANT
PIN DRILL FIX HALF THREAD (BIT) ×1 IMPLANT
PIN STEINMAN FIXATION KNEE (PIN) ×1 IMPLANT
SET HNDPC FAN SPRY TIP SCT (DISPOSABLE) ×1 IMPLANT
STAPLER VISISTAT 35W (STAPLE) ×2 IMPLANT
SUCTION FRAZIER HANDLE 10FR (MISCELLANEOUS)
SUCTION TUBE FRAZIER 10FR DISP (MISCELLANEOUS) IMPLANT
SUT VIC AB 0 CT1 27 (SUTURE) ×2
SUT VIC AB 0 CT1 27XBRD ANBCTR (SUTURE) ×1 IMPLANT
SUT VIC AB 1 CTX 36 (SUTURE)
SUT VIC AB 1 CTX36XBRD ANBCTR (SUTURE) IMPLANT
TIP HIGH FLOW IRRIGATION COAX (MISCELLANEOUS) ×1 IMPLANT
TOWEL GREEN STERILE (TOWEL DISPOSABLE) ×2 IMPLANT
TOWEL GREEN STERILE FF (TOWEL DISPOSABLE) ×2 IMPLANT
WRAP KNEE MAXI GEL POST OP (GAUZE/BANDAGES/DRESSINGS) ×2 IMPLANT

## 2019-11-24 NOTE — Progress Notes (Signed)
Received pt from PACU accompanied by staff, alert/oriented in no acute distress, Pt orientated to room/equipments.Menu has been provided, Welcome guide/pack to follow, Pt/spouse have been informed that facilty is not responsible for any losses/damages of personal/valuable belongings. Pt has the option to inform security for safe keeping. No complaints. 3 side rails up and bed in lowest position, All wheels locked.

## 2019-11-24 NOTE — Anesthesia Procedure Notes (Signed)
Anesthesia Regional Block: Adductor canal block   Pre-Anesthetic Checklist: ,, timeout performed, Correct Patient, Correct Site, Correct Laterality, Correct Procedure, Correct Position, site marked, Risks and benefits discussed, pre-op evaluation,  At surgeon's request and post-op pain management  Laterality: Right  Prep: Maximum Sterile Barrier Precautions used, chloraprep       Needles:  Injection technique: Single-shot  Needle Type: Echogenic Stimulator Needle     Needle Length: 9cm  Needle Gauge: 21     Additional Needles:   Procedures:,,,, ultrasound used (permanent image in chart),,,,  Narrative:  Start time: 11/24/2019 7:10 AM End time: 11/24/2019 7:15 AM Injection made incrementally with aspirations every 5 mL.  Performed by: Personally  Anesthesiologist: Kipp Brood, MD  Additional Notes: 30 cc 0.5% Bupivacaine with 1:200 epi injected easily

## 2019-11-24 NOTE — Op Note (Signed)
DATE OF SURGERY:  11/24/2019  TIME: 10:40 AM  PATIENT NAME:  Mason Patton    AGE: 74 y.o.    PRE-OPERATIVE DIAGNOSIS:  Osteoarthritis Right Knee  POST-OPERATIVE DIAGNOSIS:  Osteoarthritis Right Knee  PROCEDURE:  Procedure(s): RIGHT TOTAL KNEE ARTHROPLASTY  SURGEON: Aldean Baker  ASSISTANT: none  OPERATIVE IMPLANTS: Depuy , Posterior Stabilized.  Femur size 8, Tibia size 8, Patella size 38 3-peg oval button, with a 5 mm polyethylene insert.  @ENCIMAGES @        PREOPERATIVE INDICATIONS:   Mason Patton is a 74 y.o. year old male with end stage degenerative arthritis of the knee who failed conservative treatment and elected for Total Knee Arthroplasty.   The risks, benefits, and alternatives were discussed at length including but not limited to the risks of infection, bleeding, nerve injury, stiffness, blood clots, the need for revision surgery, cardiopulmonary complications, among others, and they were willing to proceed.  OPERATIVE DESCRIPTION:  The patient was brought to the operative room and placed in a supine position.  General anesthesia was administered.  IV antibiotics were given.  The lower extremity was prepped and draped in the usual sterile fashion.  66 was used to cover all exposed skin. Time out was performed.    Anterior quadriceps tendon splitting approach was performed.  The patella was everted and osteophytes were removed.  The anterior horn of the medial and lateral meniscus was removed.   The distal femur was opened with the drill and the intramedullary distal femoral cutting jig was utilized, set at 5 degrees valgus resecting 9 mm off the distal femur.  Care was taken to protect the collateral ligaments.  Then the extramedullary tibial cutting jig was utilized set for 3 degree posterior slope.  Care was taken during the cut to protect the medial and collateral ligaments.  The proximal tibia was removed along with the posterior horns of the  menisci.  The PCL was sacrificed.    The extensor gap was measured and was approximately 5 mm.    The distal femoral sizing jig was applied, taking care to avoid notching.  Then the 4-in-1 cutting jig was applied and the anterior and posterior femur was cut, along with the chamfer cuts.  All posterior osteophytes were removed.  The flexion gap was then measured and was symmetric with the extension gap.  The distal femoral preparation using the appropriate jig to prepare the box.  The patella was then measured, and cut with the saw.    The proximal tibia sized and prepared accordingly with the reamer and the punch, and then all components were trialed with the poly insert.  The knee was found to have stable balance and full motion.  The knee was irrigated with normal saline and the knee was soaked with TXA.  The above named components were then cemented into place and all excess cement was removed.  The final polyethylene component was in place during cementation.  The knee was kept in extension until the cement hardened.  The knee was then taken through a range of motion and the patella tracked well and the knee irrigated copiously and the parapatellar and subcutaneous tissue closed with vicryl, and skin closed with staples..  A sterile dressing was applied and patient  was taken to the PACU in stable  condition.  There were no complications.  Total tourniquet time was 33 minutes.

## 2019-11-24 NOTE — Evaluation (Signed)
Physical Therapy Evaluation Patient Details Name: Mason Patton MRN: [id number] DOB: 09-20-1945 Today's Date: 11/24/2019   History of Present Illness  Pt is 74 yo male admitted with knee OA and is s/p R TKA on 11/24/19.  Pt w/ PMH including L TKA, DM, HTN, CVA, see history for full PMH.  Clinical Impression  Pt is s/p TKA resulting in the deficits listed below (see PT Problem List). Pt had increased pain in R knee that limited standing - required bed significantly elevated, but once standing pt was able to walk in the room.  Pt with fair quad activation and knee ROM for DOS. Required min cues for transfer and gait technique. Pt motivated and with excellent rehab potential.  Pt will benefit from skilled PT to increase their independence and safety with mobility to allow discharge to the venue listed below.      Follow Up Recommendations Supervision/Assistance - 24 hour;Follow surgeon's recommendation for DC plan and follow-up therapies    Equipment Recommendations  Rolling walker with 5" wheels;3in1 (PT)    Recommendations for Other Services       Precautions / Restrictions Precautions Precautions: None Restrictions Weight Bearing Restrictions: Yes RLE Weight Bearing: Weight bearing as tolerated      Mobility  Bed Mobility Overal bed mobility: Needs Assistance Bed Mobility: Supine to Sit;Sit to Supine     Supine to sit: Min assist;HOB elevated Sit to supine: Min assist;HOB elevated   General bed mobility comments: min A for R LE  Transfers Overall transfer level: Needs assistance Equipment used: Rolling walker (2 wheeled) Transfers: Sit to/from Stand Sit to Stand: From elevated surface;Min assist         General transfer comment: Pt unable to stand from low surface due to pain, required bed signficiantly elevated for standing; performed x 3; cues for hand placement  Ambulation/Gait Ambulation/Gait assistance: Min guard Gait Distance (Feet): 12 Feet Assistive  device: Rolling walker (2 wheeled) Gait Pattern/deviations: Step-to pattern;Decreased weight shift to right;Decreased stance time - right;Shuffle;Decreased stride length Gait velocity: decreased   General Gait Details: started with side steps at EOB and progressed to short distance in room ;limited by pain; cues for step to pattern and use of RW  Stairs            Wheelchair Mobility    Modified Rankin (Stroke Patients Only)       Balance Overall balance assessment: Needs assistance Sitting-balance support: No upper extremity supported;Feet supported Sitting balance-Leahy Scale: Normal     Standing balance support: Bilateral upper extremity supported;During functional activity Standing balance-Leahy Scale: Fair Standing balance comment: required use of RW                             Pertinent Vitals/Pain Pain Assessment: 0-10 Pain Score: 5  Pain Location: R knee: 0/10 rest, 5/10 walk, 10/10 with initial attempt to stand Pain Descriptors / Indicators: Sore;Sharp Pain Intervention(s): Limited activity within patient's tolerance;Monitored during session;Repositioned;Relaxation;Ice applied;Premedicated before session    Home Living Family/patient expects to be discharged to:: Private residence Living Arrangements: Spouse/significant other Available Help at Discharge: Family;Available 24 hours/day Type of Home: House Home Access: Stairs to enter Entrance Stairs-Rails: Right;Left;Can reach both Entrance Stairs-Number of Steps: 3 Home Layout: One level Home Equipment: Crutches;Cane - single point;Grab bars - tub/shower      Prior Function Level of Independence: Independent         Comments: Independent except for putting on  socks     Hand Dominance        Extremity/Trunk Assessment   Upper Extremity Assessment Upper Extremity Assessment: Overall WFL for tasks assessed    Lower Extremity Assessment Lower Extremity Assessment: LLE  deficits/detail;RLE deficits/detail RLE Deficits / Details: ROM ankle and hip WFL, knee lacks ~5 ext, 85 flexion; MMT : ankle 5/5, hip and knee 2/5 LLE Deficits / Details: ROM WFL; MMT 5/5    Cervical / Trunk Assessment Cervical / Trunk Assessment: Normal  Communication   Communication: No difficulties  Cognition Arousal/Alertness: Awake/alert Behavior During Therapy: WFL for tasks assessed/performed Overall Cognitive Status: Within Functional Limits for tasks assessed                                        General Comments General comments (skin integrity, edema, etc.): VSS; pt educated on safe ice use, no pivots, and resting with leg straight    Exercises Total Joint Exercises Ankle Circles/Pumps: AROM;Both;5 reps;Supine Quad Sets: AROM;Supine;5 reps;Both Goniometric ROM: R knee 5 to 85   Assessment/Plan    PT Assessment Patient needs continued PT services  PT Problem List Decreased strength;Decreased mobility;Decreased range of motion;Decreased knowledge of precautions;Decreased activity tolerance;Decreased balance;Decreased knowledge of use of DME;Pain       PT Treatment Interventions DME instruction;Therapeutic activities;Modalities;Gait training;Therapeutic exercise;Patient/family education;Stair training;Balance training;Functional mobility training    PT Goals (Current goals can be found in the Care Plan section)  Acute Rehab PT Goals Patient Stated Goal: return home tomorrow PT Goal Formulation: With patient/family Time For Goal Achievement: 12/08/19 Potential to Achieve Goals: Good Additional Goals Additional Goal #1: R knee ROM 0 to 90 degrees for gait and stairs    Frequency 7X/week   Barriers to discharge        Co-evaluation               AM-PAC PT "6 Clicks" Mobility  Outcome Measure Help needed turning from your back to your side while in a flat bed without using bedrails?: A Little Help needed moving from lying on your back  to sitting on the side of a flat bed without using bedrails?: A Little Help needed moving to and from a bed to a chair (including a wheelchair)?: A Lot Help needed standing up from a chair using your arms (e.g., wheelchair or bedside chair)?: A Lot Help needed to walk in hospital room?: A Little Help needed climbing 3-5 steps with a railing? : A Lot 6 Click Score: 15    End of Session Equipment Utilized During Treatment: Gait belt Activity Tolerance: Patient limited by pain Patient left: in bed;with call bell/phone within reach;with bed alarm set;with SCD's reapplied Nurse Communication: Mobility status PT Visit Diagnosis: Other abnormalities of gait and mobility (R26.89);Muscle weakness (generalized) (M62.81)    Time: 1456-1530 PT Time Calculation (min) (ACUTE ONLY): 34 min   Charges:   PT Evaluation $PT Eval Moderate Complexity: 1 Mod PT Treatments $Gait Training: 8-22 mins        Maggie Font, PT Acute Rehab Services Pager [phone] [facility] [phone] [facility] [phone]   Karlton Lemon 11/24/2019, 4:50 PM

## 2019-11-24 NOTE — Plan of Care (Signed)

## 2019-11-24 NOTE — Discharge Instructions (Signed)
INSTRUCTIONS AFTER JOINT REPLACEMENT   o Remove items at home which could result in a fall. This includes throw rugs or furniture in walking pathways o ICE to the affected joint every three hours while awake for 30 minutes at a time, for at least the first 3-5 days, and then as needed for pain and swelling.  Continue to use ice for pain and swelling. You may notice swelling that will progress down to the foot and ankle.  This is normal after surgery.  Elevate your leg when you are not up walking on it.   o Continue to use the breathing machine you got in the hospital (incentive spirometer) which will help keep your temperature down.  It is common for your temperature to cycle up and down following surgery, especially at night when you are not up moving around and exerting yourself.  The breathing machine keeps your lungs expanded and your temperature down.   DIET:  As you were doing prior to hospitalization, we recommend a well-balanced diet.  DRESSING / WOUND CARE / SHOWERING  Keep Wound vac in Place. You will be instructed how to attach prevena vac. This will be removed at your 1st visit 1 week after surgery  ACTIVITY  o Increase activity slowly as tolerated, but follow the weight bearing instructions below.   o No driving for 6 weeks or until further direction given by your physician.  You cannot drive while taking narcotics.  o No lifting or carrying greater than 10 lbs. until further directed by your surgeon. o Avoid periods of inactivity such as sitting longer than an hour when not asleep. This helps prevent blood clots.  o You may return to work once you are authorized by your doctor.     WEIGHT BEARING   Weight bearing as tolerated with assist device (walker, cane, etc) as directed, use it as long as suggested by your surgeon or therapist, typically at least 4-6 weeks.   EXERCISES  Results after joint replacement surgery are often greatly improved when you follow the exercise,  range of motion and muscle strengthening exercises prescribed by your doctor. Safety measures are also important to protect the joint from further injury. Any time any of these exercises cause you to have increased pain or swelling, decrease what you are doing until you are comfortable again and then slowly increase them. If you have problems or questions, call your caregiver or physical therapist for advice.   Rehabilitation is important following a joint replacement. After just a few days of immobilization, the muscles of the leg can become weakened and shrink (atrophy).  These exercises are designed to build up the tone and strength of the thigh and leg muscles and to improve motion. Often times heat used for twenty to thirty minutes before working out will loosen up your tissues and help with improving the range of motion but do not use heat for the first two weeks following surgery (sometimes heat can increase post-operative swelling).   These exercises can be done on a training (exercise) mat, on the floor, on a table or on a bed. Use whatever works the best and is most comfortable for you.    Use music or television while you are exercising so that the exercises are a pleasant break in your day. This will make your life better with the exercises acting as a break in your routine that you can look forward to.   Perform all exercises about fifteen times, three times per  day or as directed.  You should exercise both the operative leg and the other leg as well.  Exercises include:   . Quad Sets - Tighten up the muscle on the front of the thigh (Quad) and hold for 5-10 seconds.   . Straight Leg Raises - With your knee straight (if you were given a brace, keep it on), lift the leg to 60 degrees, hold for 3 seconds, and slowly lower the leg.  Perform this exercise against resistance later as your leg gets stronger.  . Leg Slides: Lying on your back, slowly slide your foot toward your buttocks, bending your  knee up off the floor (only go as far as is comfortable). Then slowly slide your foot back down until your leg is flat on the floor again.  Glenard Haring Wings: Lying on your back spread your legs to the side as far apart as you can without causing discomfort.  . Hamstring Strength:  Lying on your back, push your heel against the floor with your leg straight by tightening up the muscles of your buttocks.  Repeat, but this time bend your knee to a comfortable angle, and push your heel against the floor.  You may put a pillow under the heel to make it more comfortable if necessary.   A rehabilitation program following joint replacement surgery can speed recovery and prevent re-injury in the future due to weakened muscles. Contact your doctor or a physical therapist for more information on knee rehabilitation.    CONSTIPATION  Constipation is defined medically as fewer than three stools per week and severe constipation as less than one stool per week.  Even if you have a regular bowel pattern at home, your normal regimen is likely to be disrupted due to multiple reasons following surgery.  Combination of anesthesia, postoperative narcotics, change in appetite and fluid intake all can affect your bowels.   YOU MUST use at least one of the following options; they are listed in order of increasing strength to get the job done.  They are all available over the counter, and you may need to use some, POSSIBLY even all of these options:    Drink plenty of fluids (prune juice may be helpful) and high fiber foods Colace 100 mg by mouth twice a day  Senokot for constipation as directed and as needed Dulcolax (bisacodyl), take with full glass of water  Miralax (polyethylene glycol) once or twice a day as needed.  If you have tried all these things and are unable to have a bowel movement in the first 3-4 days after surgery call either your surgeon or your primary doctor.    If you experience loose stools or diarrhea,  hold the medications until you stool forms back up.  If your symptoms do not get better within 1 week or if they get worse, check with your doctor.  If you experience "the worst abdominal pain ever" or develop nausea or vomiting, please contact the office immediately for further recommendations for treatment.   ITCHING:  If you experience itching with your medications, try taking only a single pain pill, or even half a pain pill at a time.  You can also use Benadryl over the counter for itching or also to help with sleep.   TED HOSE STOCKINGS:  Use stockings on both legs until for at least 2 weeks or as directed by physician office. They may be removed at night for sleeping.  MEDICATIONS:  See your medication summary on  the "After Visit Summary" that nursing will review with you.  You may have some home medications which will be placed on hold until you complete the course of blood thinner medication.  It is important for you to complete the blood thinner medication as prescribed.  PRECAUTIONS:  If you experience chest pain or shortness of breath - call 911 immediately for transfer to the hospital emergency department.   If you develop a fever greater that 101 F, purulent drainage from wound, increased redness or drainage from wound, foul odor from the wound/dressing, or calf pain - CONTACT YOUR SURGEON.                                                   FOLLOW-UP APPOINTMENTS:  If you do not already have a post-op appointment, please call the office for an appointment to be seen by your surgeon.  Guidelines for how soon to be seen are listed in your "After Visit Summary", but are typically between 1-4 weeks after surgery.  OTHER INSTRUCTIONS:   Knee Replacement:  Do not place pillow under knee, focus on keeping the knee straight while resting. CPM instructions: 0-90 degrees, 2 hours in the morning, 2 hours in the afternoon, and 2 hours in the evening. Place foam block, curve side up under heel at  all times except when in CPM or when walking.  DO NOT modify, tear, cut, or change the foam block in any way.   DENTAL ANTIBIOTICS:  In most cases prophylactic antibiotics for Dental procdeures after total joint surgery are not necessary.  Exceptions are as follows:  1. History of prior total joint infection  2. Severely immunocompromised (Organ Transplant, cancer chemotherapy, Rheumatoid biologic meds such as Humera)  3. Poorly controlled diabetes (A1C &gt; 8.0, blood glucose over 200)  If you have one of these conditions, contact your surgeon for an antibiotic prescription, prior to your dental procedure.   MAKE SURE YOU:  . Understand these instructions.  . Get help right away if you are not doing well or get worse.    Thank you for letting us be a part of your medical care team.  It is a privilege we respect greatly.  We hope these instructions will help you stay on track for a fast and full recovery!    Information on my medicine - ELIQUIS (apixaban)  This medication education was reviewed with me or my healthcare representative as part of my discharge preparation.    Why was Eliquis prescribed for you? Eliquis was prescribed for you to reduce the risk of a blood clot forming that can cause a stroke if you have a medical condition called atrial fibrillation (a type of irregular heartbeat).  What do You need to know about Eliquis ? Take your Eliquis TWICE DAILY - one tablet in the morning and one tablet in the evening with or without food. If you have difficulty swallowing the tablet whole please discuss with your pharmacist how to take the medication safely.  Take Eliquis exactly as prescribed by your doctor and DO NOT stop taking Eliquis without talking to the doctor who prescribed the medication.  Stopping may increase your risk of developing a stroke.  Refill your prescription before you run out.  After discharge, you should have regular check-up appointments  with your healthcare provider that is prescribing your  Eliquis.  In the future your dose may need to be changed if your kidney function or weight changes by a significant amount or as you get older.  What do you do if you miss a dose? If you miss a dose, take it as soon as you remember on the same day and resume taking twice daily.  Do not take more than one dose of ELIQUIS at the same time to make up a missed dose.  Important Safety Information A possible side effect of Eliquis is bleeding. You should call your healthcare provider right away if you experience any of the following: ? Bleeding from an injury or your nose that does not stop. ? Unusual colored urine (red or dark brown) or unusual colored stools (red or black). ? Unusual bruising for unknown reasons. ? A serious fall or if you hit your head (even if there is no bleeding).  Some medicines may interact with Eliquis and might increase your risk of bleeding or clotting while on Eliquis. To help avoid this, consult your healthcare provider or pharmacist prior to using any new prescription or non-prescription medications, including herbals, vitamins, non-steroidal anti-inflammatory drugs (NSAIDs) and supplements.  This website has more information on Eliquis (apixaban): http://www.eliquis.com/eliquis/home

## 2019-11-24 NOTE — Transfer of Care (Signed)
Immediate Anesthesia Transfer of Care Note  Patient: Mason Patton  Procedure(s) Performed: RIGHT TOTAL KNEE ARTHROPLASTY (Right Knee)  Patient Location: PACU  Anesthesia Type:GA combined with regional for post-op pain  Level of Consciousness: awake and patient cooperative  Airway & Oxygen Therapy: Patient Spontanous Breathing and Patient connected to face mask oxygen  Post-op Assessment: Report given to RN and Post -op Vital signs reviewed and stable  Post vital signs: Reviewed and stable  Last Vitals:  Vitals Value Taken Time  BP 139/79 11/24/19 1040  Temp    Pulse 69 11/24/19 1041  Resp 18 11/24/19 1041  SpO2 95 % 11/24/19 1041  Vitals shown include unvalidated device data.  Last Pain:  Vitals:   11/24/19 0643  PainSc: 0-No pain      Patients Stated Pain Goal: 3 (11/24/19 4097)  Complications: No apparent anesthesia complications

## 2019-11-24 NOTE — Anesthesia Procedure Notes (Signed)
Procedure Name: Intubation Date/Time: 11/24/2019 8:46 AM Performed by: Adria Dill, CRNA Pre-anesthesia Checklist: Patient identified, Emergency Drugs available, Suction available and Patient being monitored Patient Re-evaluated:Patient Re-evaluated prior to induction Oxygen Delivery Method: Circle system utilized Preoxygenation: Pre-oxygenation with 100% oxygen Induction Type: IV induction Ventilation: Mask ventilation without difficulty and Oral airway inserted - appropriate to patient size Laryngoscope Size: Hyacinth Meeker and 2 Grade View: Grade I Tube type: Oral Tube size: 7.5 mm Number of attempts: 1 Airway Equipment and Method: Stylet and Oral airway Placement Confirmation: ETT inserted through vocal cords under direct vision,  positive ETCO2 and breath sounds checked- equal and bilateral Secured at: 21 cm Tube secured with: Tape Dental Injury: Teeth and Oropharynx as per pre-operative assessment

## 2019-11-24 NOTE — Anesthesia Postprocedure Evaluation (Signed)
Anesthesia Post Note  Patient: Mason Patton  Procedure(s) Performed: RIGHT TOTAL KNEE ARTHROPLASTY (Right Knee)     Patient location during evaluation: PACU Anesthesia Type: General Level of consciousness: awake and alert Pain management: pain level controlled Vital Signs Assessment: post-procedure vital signs reviewed and stable Respiratory status: spontaneous breathing, nonlabored ventilation, respiratory function stable and patient connected to nasal cannula oxygen Cardiovascular status: blood pressure returned to baseline and stable Postop Assessment: no apparent nausea or vomiting Anesthetic complications: no    Last Vitals:  Vitals:   11/24/19 1210 11/24/19 1233  BP: 119/67 137/71  Pulse: 63 64  Resp: 14 14  Temp:  36.9 C  SpO2: 95% 95%    Last Pain:  Vitals:   11/24/19 1240  PainSc: 0-No pain                 Helayne Metsker COKER

## 2019-11-24 NOTE — H&P (Signed)
Mason Patton is an 74 y.o. male.   Chief Complaint: Right Knee Pain HPI: Patient is a 74 year old gentleman who presents with persistent pain right knee.  Patient denies any mechanical catching or locking at this time however that was his initial presentation.  He states he has had no relief with injection and has had no relief with previous arthroscopies.  Patient is status post a left total knee arthroplasty.  He states his last hemoglobin A1c was 7. Past Medical History:  Diagnosis Date  . Aortic stenosis    09/30/19 echo ([facility], Cardiologist Dr. Geraldo Pitter): Mild-moderate AS, MaxVel 308 cm/s, MeanPG 22 mmHg, AVA 1.6 cm2  . Arthritis   . Atrial fibrillation ([name])   . Carotid artery stenosis    11/03/19 [place] ([facility]): 50-69% RICA stenosis  . Cholecystitis with cholelithiasis   . Eczema   . Emphysema of lung ([place])   . Gallstones and inflammation of gallbladder without obstruction   . Heart murmur   . History of nuclear stress test 02/23/2010   dipyridamole; normal pattern of perfusion in all regions; normal, low risk study   . Hyperlipidemia   . Hypertension   . 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 Meningoencephalitis   . Obesity   . OSA on CPAP    last sleep study >8 yrs  . Pneumonia    "twice" (05/19/2013)  . Shortness of breath   . Stroke [facility])    "they said I had a minor one when I had menigitis" (05/19/2013)  . Thyroid disease   . Tobacco abuse    quit 11/08/2012  . Type II diabetes mellitus ([place])   . Umbilical hernia     Past Surgical History:  Procedure Laterality Date  . CHOLECYSTECTOMY  09/09/2011   Procedure: LAPAROSCOPIC CHOLECYSTECTOMY WITH INTRAOPERATIVE CHOLANGIOGRAM;  Surgeon: Belva Crome, MD;  Location: [facility];  Service: General;  Laterality: N/A;  . HERNIA REPAIR  04/2006   UHR  . JOINT REPLACEMENT    . KNEE ARTHROSCOPY Right    "w2 times" (05/19/2013)  . TONSILLECTOMY    . TOTAL KNEE ARTHROPLASTY  Left 05/19/2013  . TOTAL KNEE ARTHROPLASTY Left 05/19/2013   Procedure: TOTAL KNEE ARTHROPLASTY;  Surgeon: Newt Minion, MD;  Location: [facility];  Service: Orthopedics;  Laterality: Left;  Left Total Knee Arthroplasty  . TRANSTHORACIC ECHOCARDIOGRAM  03/28/2005   EF=>55%, mod conc LVH; LA mod dilated & borderline RA enlargement; mild TR; mild pulm valve regurg    Family History  Problem Relation Age of Onset  . Cancer Maternal Aunt        colon  . Cancer Maternal Aunt        lung  . Thyroid disease Mother   . Hyperlipidemia Father   . Diabetes Maternal Grandmother   . Asthma Child   . Diabetes type I Child    Social History:  reports that he has been smoking cigarettes. He has a 25.00 pack-year smoking history. He has never used smokeless tobacco. He reports that he does not drink alcohol or use drugs.  Allergies:  Allergies  Allergen Reactions  . Codeine Nausea And Vomiting  . Demerol Nausea And Vomiting    No medications prior to admission.    Results for orders placed or performed during the hospital encounter of 11/22/19 (from the past 48 hour(s))  SARS CORONAVIRUS 2 (TAT 6-24 HRS) Nasopharyngeal Nasopharyngeal Swab     Status: None   Collection Time: 11/22/19  10:14 AM   Specimen: Nasopharyngeal Swab  Result Value Ref Range   SARS Coronavirus 2 NEGATIVE NEGATIVE    Comment: (NOTE) SARS-CoV-2 target nucleic acids are NOT DETECTED. The SARS-CoV-2 RNA is generally detectable in upper and lower respiratory specimens during the acute phase of infection. Negative results do not preclude SARS-CoV-2 infection, do not rule out co-infections with other pathogens, and should not be used as the sole basis for treatment or other patient management decisions. Negative results must be combined with clinical observations, patient history, and epidemiological information. The expected result is Negative. Fact Sheet for Patients: HairSlick.no Fact Sheet  for Healthcare Providers: quierodirigir.com This test is not yet approved or cleared by the [place] FDA and  has been authorized for detection and/or diagnosis of SARS-CoV-2 by FDA under an Emergency Use Authorization (EUA). This EUA will remain  in effect (meaning this test can be used) for the duration of the COVID-19 declaration under Section 56 4(b)(1) of the Act, 21 U.S.C. section 360bbb-3(b)(1), unless the authorization is terminated or revoked sooner. Performed at [facility], [address] [place], [place] [address]    No results found.  Review of Systems  All other systems reviewed and are negative.   There were no vitals taken for this visit. Physical Exam  Patient is alert, oriented, no adenopathy, well-dressed, normal affect, normal respiratory effort. Examination patient ambulates with crutches.  There is no mechanical catching or locking with range of motion collaterals and cruciates are stable.  Review of the MRI scan shows significant tricompartmental arthritis with medial and lateral meniscal tears with a small joint effusion.Heart RRR Lungs Clr Assessment/Plan  1. Unilateral primary osteoarthritis, right knee   2. Venous insufficiency of both lower extremities     Plan: Discussed with the patient nonoperative versus operative options.  Discussed the best surgical intervention would be to proceed with a total knee arthroplasty.  Risks and benefits of surgery were discussed.  Patient states he understands and would like to proceed with total knee arthroplasty.  [place] Robynne Roat, PA 11/24/2019, 5:38 AM

## 2019-11-25 ENCOUNTER — Encounter: Payer: Self-pay | Admitting: *Deleted

## 2019-11-25 DIAGNOSIS — M1711 Unilateral primary osteoarthritis, right knee: Secondary | ICD-10-CM | POA: Diagnosis not present

## 2019-11-25 LAB — GLUCOSE, CAPILLARY
Glucose-Capillary: 120 mg/dL — ABNORMAL HIGH (ref 70–99)
Glucose-Capillary: 164 mg/dL — ABNORMAL HIGH (ref 70–99)

## 2019-11-25 MED ORDER — OXYCODONE HCL 5 MG PO TABS: 5.0000 mg | ORAL_TABLET | ORAL | 0 refills | Status: DC | PRN

## 2019-11-25 NOTE — Progress Notes (Signed)
Physical Therapy Treatment Patient Details Name: Mason Patton MRN: [id number] DOB: 19-Dec-1945 Today's Date: 11/25/2019    History of Present Illness Pt is 74 yo male admitted with knee OA and is s/p R TKA on 11/24/19.  Pt w/ PMH including L TKA, DM, HTN, CVA, see history for full PMH.    PT Comments    Pt in bed upon arrival of PT, agreeable to PT session with focus on progression of ambulation and stair training to prepare for anticipated d/c home. The pt was able to demo significant improvements in ambulation and good safety and stability with navigation of 3 steps with use of BUE on rails. The pt will continue to benefit from skilled PT to progress functional strength and ROM following surgery to allow for improved mobility and safety in the home.     Follow Up Recommendations  Supervision/Assistance - 24 hour;Follow surgeon's recommendation for DC plan and follow-up therapies     Equipment Recommendations  Rolling walker with 5" wheels;3in1 (PT)    Recommendations for Other Services       Precautions / Restrictions Precautions Precautions: Fall Restrictions Weight Bearing Restrictions: Yes RLE Weight Bearing: Weight bearing as tolerated    Mobility  Bed Mobility Overal bed mobility: Needs Assistance Bed Mobility: Supine to Sit     Supine to sit: HOB elevated;Supervision     General bed mobility comments: pt able to come to sitting EOB without assist, pt did use elevated HOB and rails  Transfers Overall transfer level: Needs assistance Equipment used: Rolling walker (2 wheeled) Transfers: Sit to/from Stand Sit to Stand: Min guard Stand pivot transfers: Min guard       General transfer comment: pt with improved ability to stand from various heights compared to yesterday. Pt prefers bed elevated for stand, able to stand from low recliner x 2 with minG  Ambulation/Gait Ambulation/Gait assistance: Supervision;Min guard Gait Distance (Feet): 15 Feet(+  82ft) Assistive device: Rolling walker (2 wheeled) Gait Pattern/deviations: Step-to pattern;Decreased weight shift to right;Decreased stance time - right;Shuffle;Decreased stride length;Antalgic Gait velocity: 0.19 m/s Gait velocity interpretation: <1.31 ft/sec, indicative of household ambulator General Gait Details: pt initially walking to recliner outside in hallway, ambulated back to room from gym after completing stairs. cues for RW placement and step pattern   Stairs Stairs: Yes Stairs assistance: Supervision Stair Management: Two rails;Step to pattern;Forwards Number of Stairs: 3 General stair comments: pt able to complete with initially minG for safety, then supervision. use of 2 rails, reports no questions or concerns about home stair navigation   Wheelchair Mobility    Modified Rankin (Stroke Patients Only)       Balance Overall balance assessment: Needs assistance Sitting-balance support: No upper extremity supported;Feet supported Sitting balance-Leahy Scale: Normal     Standing balance support: Bilateral upper extremity supported;During functional activity Standing balance-Leahy Scale: Fair Standing balance comment: can static stand without UE support, BUE support for ambulation                            Cognition Arousal/Alertness: Awake/alert Behavior During Therapy: WFL for tasks assessed/performed Overall Cognitive Status: Within Functional Limits for tasks assessed                                 General Comments: pt pleasant and cooperative      Exercises      General Comments General  comments (skin integrity, edema, etc.): pt reports he prefers crutches to RW      Pertinent Vitals/Pain Pain Assessment: Faces Faces Pain Scale: Hurts a little bit Pain Location: R knee with mobility Pain Descriptors / Indicators: Sore Pain Intervention(s): Limited activity within patient's tolerance;Monitored during  session;Repositioned;Premedicated before session    Home Living Family/patient expects to be discharged to:: Private residence Living Arrangements: Spouse/significant other Available Help at Discharge: Family;Available 24 hours/day Type of Home: House Home Access: Stairs to enter Entrance Stairs-Rails: Right;Left;Can reach both Home Layout: One level Home Equipment: Crutches;Cane - single point;Grab bars - tub/shower      Prior Function Level of Independence: Independent      Comments: Independent except for putting on socks   PT Goals (current goals can now be found in the care plan section) Acute Rehab PT Goals Patient Stated Goal: return home today PT Goal Formulation: With patient/family Time For Goal Achievement: 12/08/19 Potential to Achieve Goals: Good Additional Goals Additional Goal #1: R knee ROM 0 to 90 degrees for gait and stairs Progress towards PT goals: Progressing toward goals    Frequency    7X/week      PT Plan Current plan remains appropriate    Co-evaluation              AM-PAC PT "6 Clicks" Mobility   Outcome Measure  Help needed turning from your back to your side while in a flat bed without using bedrails?: A Little Help needed moving from lying on your back to sitting on the side of a flat bed without using bedrails?: A Little Help needed moving to and from a bed to a chair (including a wheelchair)?: A Little Help needed standing up from a chair using your arms (e.g., wheelchair or bedside chair)?: A Little Help needed to walk in hospital room?: A Little Help needed climbing 3-5 steps with a railing? : A Little 6 Click Score: 18    End of Session Equipment Utilized During Treatment: Gait belt Activity Tolerance: Patient tolerated treatment well Patient left: with call bell/phone within reach;in chair Nurse Communication: Mobility status PT Visit Diagnosis: Other abnormalities of gait and mobility (R26.89);Muscle weakness  (generalized) (M62.81)     Time: 7209-4709 PT Time Calculation (min) (ACUTE ONLY): 28 min  Charges:  $Gait Training: 23-37 mins                     Karma Ganja, PT, DPT   Acute Rehabilitation Department Pager #: [phone]    Otho Bellows 11/25/2019, 10:50 AM

## 2019-11-25 NOTE — Progress Notes (Signed)
Discharge summary provided to pt/spouse. Explained and educated pt/spouse with no complaints voiced. Questions and concerns were answered satisfactorily.pt alert/oriented in no apparent distress.Spouse is responsible for the pt's transportation.

## 2019-11-25 NOTE — Evaluation (Signed)
Occupational Therapy Evaluation and Discharge Summary Patient Details Name: Mason Patton MRN: [id number] DOB: 12-23-1945 Today's Date: 11/25/2019    History of Present Illness Pt is 74 yo male admitted with knee OA and is s/p R TKA on 11/24/19.  Pt w/ PMH including L TKA, DM, HTN, CVA, see history for full PMH.   Clinical Impression   Pt admitted with the above diagnosis and overall is doing very well with adls. Pt has had a TKA in the past and is very familiar with the techniques used to be as independent as possible with adls. Pt currently needs min assist with some LE adls but states wife will assist at home and does not want adaptive equipment.  Feel pt is safe for d/c from OT standpoint.  No further OT needs at this time.     Follow Up Recommendations  Follow surgeon's recommendation for DC plan and follow-up therapies    Equipment Recommendations  3 in 1 bedside commode    Recommendations for Other Services       Precautions / Restrictions Precautions Precautions: None Restrictions Weight Bearing Restrictions: Yes RLE Weight Bearing: Weight bearing as tolerated      Mobility Bed Mobility               General bed mobility comments: Pt in chair on arrival.  Transfers Overall transfer level: Needs assistance Equipment used: Rolling walker (2 wheeled) Transfers: Sit to/from [employer] Transfers Sit to Stand: Min guard Stand pivot transfers: Min guard       General transfer comment: Pt with increased ability to stand today compared to notes yesterday. Pt did not need physical assist getting to his feet from recliner today.    Balance Overall balance assessment: Needs assistance Sitting-balance support: No upper extremity supported;Feet supported Sitting balance-Leahy Scale: Normal     Standing balance support: Bilateral upper extremity supported;During functional activity Standing balance-Leahy Scale: Fair Standing balance comment: Pt can let go  of walker for short amounts to time for toileting.  When challenges given, pt needs walker. Pt would like to progress to crutches.                           ADL either performed or assessed with clinical judgement   ADL Overall ADL's : Needs assistance/impaired Eating/Feeding: Independent;Sitting   Grooming: Wash/dry hands;Wash/dry face;Oral care;Set up;Standing   Upper Body Bathing: Set up;Sitting   Lower Body Bathing: Minimal assistance;Sit to/from stand;Cueing for compensatory techniques   Upper Body Dressing : Set up;Sitting   Lower Body Dressing: Moderate assistance;Sit to/from stand;Cueing for compensatory techniques Lower Body Dressing Details (indicate cue type and reason): limited with starting clothes over RLE and with donning socks. Pt has not donned socks for weeks. Is not interested in sock aid. States wife can help him. Toilet Transfer: TEFL teacher Details (indicate cue type and reason): Pt walked to bathroom using 3:1 over the commode. Toileting- Clothing Manipulation and Hygiene: Supervision/safety;Sit to/from stand;Cueing for compensatory techniques     Tub/Shower Transfer Details (indicate cue type and reason): spoke about set up of pts shower at home. pt has had previous knee done and has seat in shower ready for when he returns home. Functional mobility during ADLs: Supervision/safety;Rolling walker General ADL Comments: Pt requires assist with a few LE adls due to pain but overall doing a lot for himself at this point.     Vision Baseline Vision/History: Wears glasses Wears  Glasses: At all times Patient Visual Report: No change from baseline Vision Assessment?: No apparent visual deficits     Perception     Praxis      Pertinent Vitals/Pain Pain Assessment: Faces Faces Pain Scale: Hurts little more Pain Location: R knee with mobility Pain Descriptors / Indicators: Sore Pain Intervention(s):  Monitored during session;Premedicated before session;Repositioned     Hand Dominance Right   Extremity/Trunk Assessment Upper Extremity Assessment Upper Extremity Assessment: Overall WFL for tasks assessed       Cervical / Trunk Assessment Cervical / Trunk Assessment: Normal   Communication Communication Communication: No difficulties   Cognition Arousal/Alertness: Awake/alert Behavior During Therapy: WFL for tasks assessed/performed Overall Cognitive Status: Within Functional Limits for tasks assessed                                     General Comments  Pt doing well with adls and has supportive family. Pt has had other knee done so education was familiar.    Exercises     Shoulder Instructions      Home Living Family/patient expects to be discharged to:: Private residence Living Arrangements: Spouse/significant other Available Help at Discharge: Family;Available 24 hours/day Type of Home: House Home Access: Stairs to enter [employer] of Steps: 3 Entrance Stairs-Rails: Right;Left;Can reach both Home Layout: One level     Bathroom Shower/Tub: Occupational psychologist: Handicapped height     Home Equipment: Crutches;Cane - single point;Grab bars - tub/shower          Prior Functioning/Environment Level of Independence: Independent        Comments: Independent except for putting on socks        OT Problem List:        OT Treatment/Interventions:      OT Goals(Current goals can be found in the care plan section) Acute Rehab OT Goals Patient Stated Goal: return home today OT Goal Formulation: All assessment and education complete, DC therapy  OT Frequency:     Barriers to D/C:            Co-evaluation              AM-PAC OT "6 Clicks" Daily Activity     Outcome Measure Help from another person eating meals?: None Help from another person taking care of personal grooming?: None Help from another  person toileting, which includes using toliet, bedpan, or urinal?: A Little Help from another person bathing (including washing, rinsing, drying)?: A Little Help from another person to put on and taking off regular upper body clothing?: None Help from another person to put on and taking off regular lower body clothing?: A Little 6 Click Score: 21   End of Session Equipment Utilized During Treatment: Rolling walker Nurse Communication: Mobility status  Activity Tolerance: Patient tolerated treatment well Patient left: in chair;with call bell/phone within reach  OT Visit Diagnosis: Unsteadiness on feet (R26.81)                Time: 0940-1000 OT Time Calculation (min): 20 min Charges:  OT General Charges $OT Visit: 1 Visit OT Evaluation $OT Eval Low Complexity: 1 Low  Glenford Peers 11/25/2019, 10:09 AM

## 2019-11-25 NOTE — Discharge Summary (Signed)
Discharge Diagnoses:  Active Problems:   Unilateral primary osteoarthritis, right knee   Arthritis of right knee   Surgeries: Procedure(s): RIGHT TOTAL KNEE ARTHROPLASTY on 11/24/2019    Consultants:   Discharged Condition: Improved  Hospital Course: Mason Patton is an 74 y.o. male who was admitted 11/24/2019 with a chief complaint of Right Knee pain, with a final diagnosis of Osteoarthritis Right Knee.  Patient was brought to the operating room on 11/24/2019 and underwent Procedure(s): RIGHT TOTAL KNEE ARTHROPLASTY.    Patient was given perioperative antibiotics:  Anti-infectives (From admission, onward)   Start     Dose/Rate Route Frequency Ordered Stop   11/24/19 1500  ceFAZolin (ANCEF) IVPB 2g/100 mL premix     2 g 200 mL/hr over 30 Minutes Intravenous Every 6 hours 11/24/19 1245 11/24/19 2128   11/24/19 1245  ceFAZolin (ANCEF) IVPB 2g/100 mL premix  Status:  Discontinued     2 g 200 mL/hr over 30 Minutes Intravenous Every 6 hours 11/24/19 1242 11/24/19 1245   11/24/19 0600  ceFAZolin (ANCEF) 3 g in dextrose 5 % 50 mL IVPB     3 g 100 mL/hr over 30 Minutes Intravenous On call to O.R. 11/23/19 5726 11/24/19 2035    .  Patient was given sequential compression devices, early ambulation, and aspirin for DVT prophylaxis.  Recent vital signs:  Patient Vitals for the past 24 hrs:  BP Temp Temp src Pulse Resp SpO2  11/25/19 0247 116/70 (!) 97.5 F (36.4 C) Oral (!) 54 16 92 %  11/25/19 0237 (!) 138/93 (!) 97.4 F (36.3 C) Oral 87 15 97 %  11/25/19 0002 119/76 97.7 F (36.5 C) Oral 63 14 93 %  11/24/19 1929 129/78 (!) 97.5 F (36.4 C) Oral 69 16 93 %  11/24/19 1639 132/68 98.1 F (36.7 C) Oral 64 16 93 %  11/24/19 1233 137/71 98.5 F (36.9 C) -- 64 14 95 %  11/24/19 1210 119/67 -- -- 63 14 95 %  11/24/19 1155 121/81 97.6 F (36.4 C) -- (!) 58 13 96 %  11/24/19 1140 119/71 -- -- (!) 59 14 94 %  11/24/19 1125 125/68 -- -- 66 15 95 %  11/24/19 1110 128/72 -- -- 61 14 95  %  11/24/19 1055 129/74 -- -- 61 17 94 %  11/24/19 1040 139/79 (!) 97 F (36.1 C) -- 66 18 93 %  .  Recent laboratory studies: No results found.  Discharge Medications:   Allergies as of 11/25/2019      Reactions   Codeine Nausea And Vomiting   Demerol Nausea And Vomiting      Medication List    TAKE these medications   amLODipine 10 MG tablet Commonly known as: NORVASC Take 10 mg by mouth daily.   atorvastatin 40 MG tablet Commonly known as: LIPITOR Take 40 mg by mouth at bedtime. Reported on 01/11/2016   cholecalciferol 25 MCG (1000 UNIT) tablet Commonly known as: VITAMIN D3 Take 1,000 Units by mouth daily.   dipyridamole-aspirin 200-25 MG 12hr capsule Commonly known as: AGGRENOX Take 1 capsule by mouth 2 (two) times daily.   Eliquis 5 MG Tabs tablet Generic drug: apixaban Take 5 mg by mouth 2 (two) times daily.   glipiZIDE 10 MG tablet Commonly known as: GLUCOTROL Take 10 mg by mouth 2 (two) times daily before a meal.   glucose blood test strip Commonly known as: OneTouch Verio Use to test blood sugar 2-3 times daily as instructed. Dx: E11.9  hydrochlorothiazide 25 MG tablet Commonly known as: HYDRODIURIL Take 12.5 mg by mouth daily.   levothyroxine 112 MCG tablet Commonly known as: SYNTHROID Take 224 mcg by mouth daily before breakfast.   losartan 100 MG tablet Commonly known as: COZAAR Take 100 mg by mouth daily.   metFORMIN 500 MG 24 hr tablet Commonly known as: GLUCOPHAGE-XR TAKE 4 TABLETS BY MOUTH  DAILY WITH DINNER What changed: See the new instructions.   metoprolol 200 MG 24 hr tablet Commonly known as: TOPROL-XL Take 100 mg by mouth daily. Take with or immediately following a meal.   OneTouch Delica Lancets Fine Misc Use to test blood sugar 2-3 times daily as instructed. Dx: E11.9   OneTouch Verio Flex System w/Device Kit 1 each by Does not apply route daily. Dx: E11.9   oxyCODONE 5 MG immediate release tablet Commonly known as: Oxy  IR/ROXICODONE Take 1-2 tablets (5-10 mg total) by mouth every 4 (four) hours as needed for moderate pain (pain score 4-6).   PRESERVISION AREDS 2+MULTI VIT PO Take 1 capsule by mouth in the morning and at bedtime.   multivitamin with minerals tablet Take 1 tablet by mouth daily.   Ventolin HFA 108 (90 Base) MCG/ACT inhaler Generic drug: albuterol Inhale 1-2 puffs into the lungs every 4 (four) hours as needed for wheezing or shortness of breath.            Durable Medical Equipment  (From admission, onward)         Start     Ordered   11/24/19 1243  DME Walker rolling  Once    Question Answer Comment  Walker: With 5 Inch Wheels   Patient needs a walker to treat with the following condition History of total right knee replacement      11/24/19 1242   11/24/19 1243  DME 3 n 1  Once     11/24/19 1242          Diagnostic Studies: No results found.  Patient benefited maximally from their hospital stay and there were no complications.     Disposition: Discharge disposition: 01-Home or Self Care      Discharge Instructions    Call MD / Call 911   Complete by: As directed    If you experience chest pain or shortness of breath, CALL 911 and be transported to the hospital emergency room.  If you develope a fever above 101 F, pus (white drainage) or increased drainage or redness at the wound, or calf pain, call your surgeon's office.   Constipation Prevention   Complete by: As directed    Drink plenty of fluids.  Prune juice may be helpful.  You may use a stool softener, such as Colace (over the counter) 100 mg twice a day.  Use MiraLax (over the counter) for constipation as needed.   Diet - low sodium heart healthy   Complete by: As directed    Discharge instructions   Complete by: As directed    Weightbearing as tolerated. Elevate leg. Keep dressing dry   Increase activity slowly as tolerated   Complete by: As directed    Negative Pressure Wound Therapy -  Incisional   Complete by: As directed    Show patient how to attach prevena pump     Follow-up Information    Yeimi Debnam, Bevely Palmer, PA In 1 week.   Specialty: Orthopedic Surgery Contact information: [address] [place] [address] [phone]  Signed: Bevely Palmer Lexys Milliner 11/25/2019, 7:08 AM

## 2019-11-25 NOTE — TOC Transition Note (Signed)
Transition of Care [facility]) - CM/SW Discharge Note   Patient Details  Name: Mason CARREON MRN: [id number] Date of Birth: [birth year]  [facility]) CM/SW Contact:  Epifanio Lesches, RN Phone Number: [phone] 11/25/2019, 8:16 AM   Clinical Narrative:      - s/p RIGHT TOTAL KNEE ARTHROPLASTY , 11/24/2019  Pt will transition home today wife. Pt states wife to provide supervision and assistance needed once d/c. Pt with transportation to home, wife. DME ( rolling walker and 3 in 1 ) will be delivered to beside prior to d/c. Pt without Rx med concerns or affordability. Hospital f/u noted on AVS.  Final next level of care: Home/Self Care Barriers to Discharge: No Barriers Identified   Patient Goals and CMS Choice        Discharge Placement                       Discharge Plan and Services                DME Arranged: 3-N-1, Walker rolling   Date DME Agency Contacted: 11/25/19 Time DME Agency Contacted: [phone] Representative spoke with at DME Agency: Ian Malkin            Social Determinants of Health (SDOH) Interventions     Readmission Risk Interventions No flowsheet data found.

## 2019-11-25 NOTE — Plan of Care (Signed)

## 2019-11-26 ENCOUNTER — Telehealth: Payer: Self-pay | Admitting: Orthopedic Surgery

## 2019-11-26 ENCOUNTER — Inpatient Hospital Stay: Payer: PRIVATE HEALTH INSURANCE | Admitting: Physician Assistant

## 2019-11-26 NOTE — Telephone Encounter (Signed)
This has been addressed. Pt will have therapy tomorrow.

## 2019-11-26 NOTE — Telephone Encounter (Signed)
I called and offered and appt for today and the pt declined. The pt disconnected the vac and turned it off. He wants to remove this himself and come in for an appt next week 12/02/19. Advised I would like for him to come in the office and be seen today but says that he dos not want to come in and that he will remove this himself and cover with an ace bandage and see [place] in the office next Thursday. Pt is s/p a total knee replacement on 11/24/19 [facility] called [facility] and they said that they can try and get out to see him today or at the latest tomorrow and remove the vac for the pt. I called and sw the wife and she is in agreement with this and will call with any questions.

## 2019-11-26 NOTE — Telephone Encounter (Signed)
Patient's wife called/   She is requesting a call back to discuss the patient's immediate need for PT.   Call back: [phone]

## 2019-11-26 NOTE — Telephone Encounter (Signed)
Patient called asking a call back from Dr. Audrie Lia nurse. Patient did not say the nature of the call. Only said he and his wife has some questions. Patient phone number is [phone].

## 2019-11-26 NOTE — Telephone Encounter (Signed)
This has been done see previous message.  

## 2019-11-26 NOTE — Telephone Encounter (Signed)
Pt states he cant walk and isn't sure if he'll make it to his appt today; he would like to know if he can take his wound vac off?  [phone]

## 2019-11-27 LAB — GLUCOSE, CAPILLARY: Glucose-Capillary: 143 mg/dL — ABNORMAL HIGH (ref 70–99)

## 2019-11-30 ENCOUNTER — Telehealth: Payer: Self-pay | Admitting: Orthopedic Surgery

## 2019-11-30 ENCOUNTER — Other Ambulatory Visit: Payer: Self-pay | Admitting: Physician Assistant

## 2019-11-30 MED ORDER — OXYCODONE HCL 5 MG PO TABS: 5.0000 mg | ORAL_TABLET | ORAL | 0 refills | Status: DC | PRN

## 2019-11-30 NOTE — Telephone Encounter (Signed)
Patient's wife called requesting an RX refill on his Oxycodone.  Patient uses [facility] in [place].  Patient's wife would like a call back when the RX is sent into the pharmacy.  Patient has 3 pills left.  CB#[phone].  Thank you.

## 2019-11-30 NOTE — Telephone Encounter (Signed)
Pt is s/p a total knee last Wednesday and is requesting refill on pain medication. Last fill was 11/25/19 #30 oxycodone 5/325 please advise.

## 2019-12-02 ENCOUNTER — Encounter: Payer: Self-pay | Admitting: Orthopedic Surgery

## 2019-12-02 ENCOUNTER — Ambulatory Visit (INDEPENDENT_AMBULATORY_CARE_PROVIDER_SITE_OTHER): Payer: Medicare Other | Admitting: Physician Assistant

## 2019-12-02 ENCOUNTER — Other Ambulatory Visit: Payer: Self-pay

## 2019-12-02 DIAGNOSIS — M1711 Unilateral primary osteoarthritis, right knee: Secondary | ICD-10-CM

## 2019-12-02 NOTE — Progress Notes (Signed)
Office Visit Note   Patient: Mason Patton           Date of Birth: June 25, 1946           MRN: [id number] Visit Date: 12/02/2019              Requested by: Roger Kill, PA-C [address] [place],  [place] [address] PCP: Roger Kill, PA-C  No chief complaint on file.     HPI: This is a pleasant gentleman who is 9 days status post right total knee replacement.  He has been working with therapy.  His biggest difficulty is of swelling which makes it painful for him to extend and bend his knee.  Per physical therapy he is missing 15 degrees of extension and only bending to 55 degrees she denies any calf pain.  Assessment & Plan: Visit Diagnoses: No diagnosis found.  Plan: I have e given him a prescription for 2 XL VIVE compression stocking.  He is used compression stockings in the past and found them too tight.  He should continue to work on range of motion as this will help with decreasing the swelling.  He is on Eliquis which certainly contributes to this.  He said he was on some type of blood thinner for his left knee replacement many years ago and he did not seem to have the same problem at his next visit x-rays of his knee should be obtained  Follow-Up Instructions: No follow-ups on file.   Ortho Exam  Patient is alert, oriented, no adenopathy, well-dressed, normal affect, normal respiratory effort. Right knee.  He has moderate soft tissue swelling extending from the knee down into the foot.  Compartments are soft and nontender.  He has some mild swelling and bruising in his thigh.  Surgical staples are in place no drainage well apposed wound edges no cellulitis  Imaging: No results found.   Labs: Lab Results  Component Value Date   HGBA1C 6.9 (H) 11/22/2019   HGBA1C 7.7 07/24/2017   HGBA1C 8.3 02/03/2017   REPTSTATUS 03/27/2013 FINAL 03/23/2013   REPTSTATUS 03/23/2013 FINAL 03/23/2013   GRAMSTAIN  03/23/2013    WBC PRESENT, PREDOMINANTLY  PMN NO ORGANISMS SEEN Performed at [facility] Performed at [facility]   GRAMSTAIN  03/23/2013    WBC PRESENT, PREDOMINANTLY MONONUCLEAR NO ORGANISMS SEEN CYTOSPIN SLIDE   CULT  03/23/2013    NO GROWTH 3 DAYS Performed at [facility]     Lab Results  Component Value Date   ALBUMIN 4.1 04/12/2016   ALBUMIN 3.8 11/07/2014   ALBUMIN 3.5 11/15/2013    No results found for: MG No results found for: VD25OH  No results found for: PREALBUMIN CBC EXTENDED Latest Ref Rng & Units 11/22/2019 11/07/2014 11/15/2013  WBC 4.0 - 10.5 K/uL 9.0 9.9 9.6  RBC 4.22 - 5.81 MIL/uL 4.39 4.62 4.48  HGB 13.0 - 17.0 g/dL 12.9(L) 14.1 12.7(L)  HCT 39.0 - 52.0 % 40.3 40.9 38.4(L)  PLT 150 - 400 K/uL 205 196 201  NEUTROABS 1.7 - 7.7 K/uL - - 7.2  LYMPHSABS 0.7 - 4.0 K/uL - - 1.5     There is no height or weight on file to calculate BMI.  Orders:  No orders of the defined types were placed in this encounter.  No orders of the defined types were placed in this encounter.    Procedures: No procedures performed  Clinical Data: No additional findings.  ROS:  All other systems negative, except as noted in the HPI. Review of Systems  Objective: Vital Signs: There were no vitals taken for this visit.  Specialty Comments:  No specialty comments available.  PMFS History: Patient Active Problem List   Diagnosis Date Noted  . Arthritis of right knee 11/24/2019  . Unilateral primary osteoarthritis, right knee   . Uncontrolled type 2 diabetes mellitus with peripheral neuropathy ([place]) 03/27/2015  . 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 CVA (cerebral infarction) 11/16/2013  . Dizziness 11/15/2013  . Left sided lacunar infarction ([place]) 11/15/2013  . Lacunar infarction ([place]) 06/01/2013  . Acute, but ill-defined, cerebrovascular disease 06/01/2013  . 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 Acute CVA (cerebrovascular accident) ([place]) 03/29/2013  . Numbness and tingling  03/27/2013  . Hyperglycemia 03/26/2013  . Meningitis 03/25/2013  . Encephalopathy acute 03/23/2013  . Fever 03/23/2013  . OSA on CPAP 03/23/2013  . Obesity 03/23/2013  . Murmur 03/16/2013  . Preoperative cardiovascular examination 03/16/2013  . Dyslipidemia 03/16/2013  . Hypertension   . Cholecystitis with cholelithiasis   . Apnea, sleep 01/23/2010   Past Medical History:  Diagnosis Date  . Aortic stenosis    09/30/19 echo ([facility], Cardiologist Dr. Geraldo Pitter): Mild-moderate AS, MaxVel 308 cm/s, MeanPG 22 mmHg, AVA 1.6 cm2  . Arthritis   . Atrial fibrillation ([place])   . Carotid artery stenosis    11/03/19 [place] ([facility]): 50-69% RICA stenosis  . Cholecystitis with cholelithiasis   . Eczema   . Emphysema of lung ([place])   . Gallstones and inflammation of gallbladder without obstruction   . Heart murmur   . History of nuclear stress test 02/23/2010   dipyridamole; normal pattern of perfusion in all regions; normal, low risk study   . Hyperlipidemia   . Hypertension   . 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 Meningoencephalitis   . Obesity   . OSA on CPAP    last sleep study >8 yrs  . Pneumonia    "twice" (05/19/2013)  . Shortness of breath   . Stroke [facility])    "they said I had a minor one when I had menigitis" (05/19/2013)  . Thyroid disease   . Tobacco abuse    quit 11/08/2012  . Type II diabetes mellitus ([place])   . Umbilical hernia     Family History  Problem Relation Age of Onset  . Cancer Maternal Aunt        colon  . Cancer Maternal Aunt        lung  . Thyroid disease Mother   . Hyperlipidemia Father   . Diabetes Maternal Grandmother   . Asthma Child   . Diabetes type I Child     Past Surgical History:  Procedure Laterality Date  . CHOLECYSTECTOMY  09/09/2011   Procedure: LAPAROSCOPIC CHOLECYSTECTOMY WITH INTRAOPERATIVE CHOLANGIOGRAM;  Surgeon: Belva Crome, MD;  Location: [facility];  Service: General;  Laterality: N/A;   . HERNIA REPAIR  04/2006   UHR  . JOINT REPLACEMENT    . KNEE ARTHROSCOPY Right    "w2 times" (05/19/2013)  . TONSILLECTOMY    . TOTAL KNEE ARTHROPLASTY Left 05/19/2013  . TOTAL KNEE ARTHROPLASTY Left 05/19/2013   Procedure: TOTAL KNEE ARTHROPLASTY;  Surgeon: Newt Minion, MD;  Location: [facility];  Service: Orthopedics;  Laterality: Left;  Left Total Knee Arthroplasty  . TOTAL KNEE ARTHROPLASTY Right 11/24/2019  . TOTAL KNEE ARTHROPLASTY Right 11/24/2019   Procedure: RIGHT TOTAL KNEE ARTHROPLASTY;  Surgeon: Meridee Score  V, MD;  Location: [facility];  Service: Orthopedics;  Laterality: Right;  . TRANSTHORACIC ECHOCARDIOGRAM  03/28/2005   EF=>55%, mod conc LVH; LA mod dilated & borderline RA enlargement; mild TR; mild pulm valve regurg   Social History   Occupational History    Employer: [employer]  Tobacco Use  . Smoking status: Current Every Day Smoker    Packs/day: 0.50    Years: 50.00    Pack years: 25.00    Types: Cigarettes  . Smokeless tobacco: Never Used  Substance and Sexual Activity  . Alcohol use: No  . Drug use: No  . Sexual activity: Yes

## 2019-12-09 ENCOUNTER — Ambulatory Visit (INDEPENDENT_AMBULATORY_CARE_PROVIDER_SITE_OTHER): Payer: Medicare Other | Admitting: Orthopedic Surgery

## 2019-12-09 ENCOUNTER — Encounter: Payer: Self-pay | Admitting: Orthopedic Surgery

## 2019-12-09 ENCOUNTER — Other Ambulatory Visit: Payer: Self-pay

## 2019-12-09 VITALS — Ht 70.0 in | Wt 261.2 lb

## 2019-12-09 DIAGNOSIS — Z96651 Presence of right artificial knee joint: Secondary | ICD-10-CM

## 2019-12-09 DIAGNOSIS — M1711 Unilateral primary osteoarthritis, right knee: Secondary | ICD-10-CM

## 2019-12-13 ENCOUNTER — Encounter: Payer: Self-pay | Admitting: Orthopedic Surgery

## 2019-12-13 ENCOUNTER — Other Ambulatory Visit: Payer: Self-pay

## 2019-12-13 ENCOUNTER — Ambulatory Visit (INDEPENDENT_AMBULATORY_CARE_PROVIDER_SITE_OTHER): Payer: Medicare Other | Admitting: Physical Therapy

## 2019-12-13 ENCOUNTER — Encounter: Payer: Self-pay | Admitting: Physical Therapy

## 2019-12-13 DIAGNOSIS — R531 Weakness: Secondary | ICD-10-CM

## 2019-12-13 DIAGNOSIS — R2681 Unsteadiness on feet: Secondary | ICD-10-CM | POA: Diagnosis not present

## 2019-12-13 DIAGNOSIS — R2689 Other abnormalities of gait and mobility: Secondary | ICD-10-CM | POA: Diagnosis not present

## 2019-12-13 DIAGNOSIS — R293 Abnormal posture: Secondary | ICD-10-CM | POA: Diagnosis not present

## 2019-12-13 DIAGNOSIS — R600 Localized edema: Secondary | ICD-10-CM

## 2019-12-13 DIAGNOSIS — M25661 Stiffness of right knee, not elsewhere classified: Secondary | ICD-10-CM

## 2019-12-13 NOTE — Progress Notes (Signed)
Office Visit Note   Patient: Mason Patton           Date of Birth: 23-Jun-1946           MRN: [id number] Visit Date: 12/09/2019              Requested by: Heywood Bene, PA-C [address] [place],  [place] [address] PCP: Heywood Bene, PA-C  Chief Complaint  Patient presents with   Right Knee - Routine Post Op    11/24/19 RTKA      HPI: Patient is a 74 year old gentleman who presents status post right total knee arthroplasty.  Patient is approximately 2 weeks out.  Assessment & Plan: Visit Diagnoses:  1. Unilateral primary osteoarthritis, right knee     Plan: Staples are harvested we will set him up with therapy.  Continue to work on knee extension exercises.  Follow-Up Instructions: Return in about 2 weeks (around 12/23/2019).   Ortho Exam  Patient is alert, oriented, no adenopathy, well-dressed, normal affect, normal respiratory effort. Examination the incision is well-healed he is currently wearing knee-high compression stockings he has been set up for physical therapy upstairs he lacks 10 degrees to full extension.  Staples harvested today.  Imaging: No results found. No images are attached to the encounter.  Labs: Lab Results  Component Value Date   HGBA1C 6.9 (H) 11/22/2019   HGBA1C 7.7 07/24/2017   HGBA1C 8.3 02/03/2017   REPTSTATUS 03/27/2013 FINAL 03/23/2013   REPTSTATUS 03/23/2013 FINAL 03/23/2013   GRAMSTAIN  03/23/2013    WBC PRESENT, PREDOMINANTLY PMN NO ORGANISMS SEEN Performed at [facility] Performed at [facility]  03/23/2013    WBC PRESENT, PREDOMINANTLY MONONUCLEAR NO ORGANISMS SEEN CYTOSPIN SLIDE   CULT  03/23/2013    NO GROWTH 3 DAYS Performed at [facility]     Lab Results  Component Value Date   ALBUMIN 4.1 04/12/2016   ALBUMIN 3.8 11/07/2014   ALBUMIN 3.5 11/15/2013    No results found for: MG No results found for: VD25OH  No results found for:  PREALBUMIN CBC EXTENDED Latest Ref Rng & Units 11/22/2019 11/07/2014 11/15/2013  WBC 4.0 - 10.5 K/uL 9.0 9.9 9.6  RBC 4.22 - 5.81 MIL/uL 4.39 4.62 4.48  HGB 13.0 - 17.0 g/dL 12.9(L) 14.1 12.7(L)  HCT 39.0 - 52.0 % 40.3 40.9 38.4(L)  PLT 150 - 400 K/uL 205 196 201  NEUTROABS 1.7 - 7.7 K/uL - - 7.2  LYMPHSABS 0.7 - 4.0 K/uL - - 1.5     Body mass index is 37.49 kg/m.  Orders:  No orders of the defined types were placed in this encounter.  No orders of the defined types were placed in this encounter.    Procedures: No procedures performed  Clinical Data: No additional findings.  ROS:  All other systems negative, except as noted in the HPI. Review of Systems  Objective: Vital Signs: Ht 5\' 10"  (1.778 m)    Wt 261 lb 4 oz (118.5 kg)    BMI 37.49 kg/m   Specialty Comments:  No specialty comments available.  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 Clovis Riley): Mild-moderate AS, MaxVel 308 cm/s, MeanPG 22 mmHg, AVA 1.6 cm2   Arthritis    Atrial fibrillation ([facility])    Carotid artery stenosis    11/03/19 [place] ([facility]): 50-69% RICA stenosis   Cholecystitis with cholelithiasis    Eczema    Emphysema of lung ([facility])    Gallstones and inflammation of gallbladder without obstruction    Heart murmur    History of nuclear stress test 02/23/2010   dipyridamole; normal pattern of perfusion in all regions; normal, low risk study    Hyperlipidemia    Hypertension    Hypothyroidism    "had thyroid killed w/radiation" (05/19/2013)   Meningoencephalitis    Obesity    OSA on CPAP    last sleep study >8 yrs   Pneumonia    "twice" (05/19/2013)   Shortness of breath    Stroke [facility])    "they said I had a minor one when I had menigitis" (05/19/2013)   Thyroid disease    Tobacco abuse    quit 11/08/2012   Type II diabetes mellitus ([facility])    Umbilical hernia     Family History  Problem Relation Age of Onset   Cancer Maternal Aunt        colon   Cancer Maternal Aunt        lung   Thyroid disease Mother    Hyperlipidemia Father    Diabetes Maternal Grandmother    Asthma Child    Diabetes type I Child     Past Surgical History:  Procedure Laterality Date   CHOLECYSTECTOMY  09/09/2011   Procedure: LAPAROSCOPIC CHOLECYSTECTOMY WITH INTRAOPERATIVE CHOLANGIOGRAM;  Surgeon: Jetty Duhamel, MD;  [facility];  Service: General;  Laterality: N/A;   HERNIA REPAIR  04/2006   UHR   JOINT REPLACEMENT     KNEE ARTHROSCOPY Right    "w2 times" (05/19/2013)   TONSILLECTOMY     TOTAL KNEE ARTHROPLASTY Left 05/19/2013   TOTAL KNEE ARTHROPLASTY Left 05/19/2013   Procedure: TOTAL KNEE ARTHROPLASTY;  Surgeon: Nadara Mustard, MD;  [facility];  Service: Orthopedics;  Laterality: Left;  Left Total Knee Arthroplasty   TOTAL KNEE ARTHROPLASTY Right 11/24/2019   TOTAL KNEE ARTHROPLASTY Right 11/24/2019    Procedure: RIGHT TOTAL KNEE ARTHROPLASTY;  Surgeon: Nadara Mustard, MD;  Location: [facility] OR;  Service: Orthopedics;  Laterality: Right;   TRANSTHORACIC ECHOCARDIOGRAM  03/28/2005   EF=>55%, mod conc LVH; LA mod dilated & borderline RA enlargement; mild TR; mild pulm valve regurg   Social History   Occupational History    Employer: [employer]  Tobacco Use   Smoking status: Current Every Day Smoker    Packs/day: 0.50    Years: 50.00    Pack years: 25.00    Types: Cigarettes   Smokeless tobacco: Never Used  Substance and Sexual Activity   Alcohol  use: No   Drug use: No   Sexual activity: Yes

## 2019-12-13 NOTE — Therapy (Signed)
[facility] Physical Therapy [address] [place], [place], [address] Phone: [phone]   Fax:  [phone]  Physical Therapy Evaluation  Patient Details  Name: JEROMY BORCHERDING MRN: [id number] Date of Birth: April 17, 1946 Referring Provider (PT): Meridee Score  MD   Encounter Date: 12/13/2019  PT End of Session - 12/13/19 1057    Visit Number  1    Number of Visits  25    Date for PT Re-Evaluation  03/10/20    Authorization Type  Medicare A&B, generic commerical    PT Start Time  1010    Activity Tolerance  Patient limited by pain    Behavior During Therapy  [facility] for tasks assessed/performed       Past Medical History:  Diagnosis Date   Aortic stenosis    09/30/19 echo ([facility], Cardiologist Dr. Geraldo Pitter): Mild-moderate AS, MaxVel 308 cm/s, MeanPG 22 mmHg, AVA 1.6 cm2   Arthritis    Atrial fibrillation (HCC)    Carotid artery stenosis    11/03/19 [place] ([facility]): 50-69% RICA stenosis   Cholecystitis with cholelithiasis    Eczema    Emphysema of lung ([place])    Gallstones and inflammation of gallbladder without obstruction    Heart murmur    History of nuclear stress test 02/23/2010   dipyridamole; normal pattern of perfusion in all regions; normal, low risk study    Hyperlipidemia    Hypertension    Hypothyroidism    "had thyroid killed w/radiation" (05/19/2013)   Meningoencephalitis    Obesity    OSA on CPAP    last sleep study >8 yrs   Pneumonia    "twice" (05/19/2013)   Shortness of breath    Stroke [facility])    "they said I had a minor one when I had menigitis" (05/19/2013)   Thyroid disease    Tobacco abuse    quit 11/08/2012   Type II diabetes mellitus ([place])    Umbilical hernia     Past Surgical History:  Procedure Laterality Date   CHOLECYSTECTOMY  09/09/2011   Procedure: LAPAROSCOPIC CHOLECYSTECTOMY WITH INTRAOPERATIVE CHOLANGIOGRAM;  Surgeon: Belva Crome, MD;  Location: [place];  Service: General;   Laterality: N/A;   HERNIA REPAIR  04/2006   UHR   JOINT REPLACEMENT     KNEE ARTHROSCOPY Right    "w2 times" (05/19/2013)   TONSILLECTOMY     TOTAL KNEE ARTHROPLASTY Left 05/19/2013   TOTAL KNEE ARTHROPLASTY Left 05/19/2013   Procedure: TOTAL KNEE ARTHROPLASTY;  Surgeon: Newt Minion, MD;  Location: [place];  Service: Orthopedics;  Laterality: Left;  Left Total Knee Arthroplasty   TOTAL KNEE ARTHROPLASTY Right 11/24/2019   TOTAL KNEE ARTHROPLASTY Right 11/24/2019   Procedure: RIGHT TOTAL KNEE ARTHROPLASTY;  Surgeon: Newt Minion, MD;  Location: [place];  Service: Orthopedics;  Laterality: Right;   TRANSTHORACIC ECHOCARDIOGRAM  03/28/2005   EF=>55%, mod conc LVH; LA mod dilated & borderline RA enlargement; mild TR; mild pulm valve regurg    There were no vitals filed for this visit.   Subjective Assessment - 12/13/19 1016    Subjective  This 74yo male was referred by Meridee Score, MD with Osteoarthritis right knee with Total Knee Arthroplasty on 11/24/2019. Hx of left Total Knee Arthroplasty 05/19/2013. 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  Yes    Pain Score  6    in last week, worst 10/10, best 0/10   Pain Location  Knee    Pain Orientation  Right;Anterior;Medial;Lateral    Pain Descriptors / Indicators  Aching;Sharp;Throbbing    Pain Type  Surgical pain    Pain Radiating Towards  some times up to hip    Pain Onset  More than a month ago    Pain Frequency  Intermittent    Aggravating Factors   doing exercises bending > straightening    Pain Relieving Factors  medications & staying off it         [facility] PT Assessment  - 12/13/19 1010      Assessment   Medical Diagnosis  right Total Knee Arthroplasty    Referring Provider (PT)  Aldean Baker  MD    Onset Date/Surgical Date  11/24/19    Hand Dominance  Right    Prior Therapy  HHPT thru 12/10/2019      Precautions   Precautions  Fall      Balance Screen   Has the patient fallen in the past 6 months  No    Has the patient had a decrease in activity level because of a fear of falling?   No    Is the patient reluctant to leave their home because of a fear of falling?   No      Home Environment   Living Environment  Private residence    Living Arrangements  Spouse/significant other;Children   adult son   Type of Home  House    Home Access  Stairs to enter    Entrance Stairs-Number of Steps  3    Entrance Stairs-Rails  Right;Left;Can reach both    Home Layout  One level    Home Equipment  Walker - 2 wheels;Crutches;Cane - single point;Bedside commode;Shower seat      Prior Function   Level of Independence  Independent;Independent with household mobility without device;Independent with community mobility without device    Vocation  Retired    Leisure  active outdoors      Observation/Other Assessments-Edema    Edema  Circumferential      Circumferential Edema   Circumferential - Right  5" superior to patella 22 7/8",  mid patella 20 5/8", 2" inferior to patella 17 5/8"    Circumferential - Left   5" superior to patella 21 1/4",  mid patella 19 3/8", 2" inferior to patella 16 1/2"      ROM / Strength   AROM / PROM / Strength  AROM;PROM;Strength      AROM   Overall AROM   Deficits    Right Knee Extension  -32   -32* seated, -17* supine   Right Knee Flexion  70   70* seated, 71* supine    Left Knee Extension  -14    Left Knee Flexion  110      PROM   Overall PROM   Deficits    PROM Assessment Site  Knee    Right Knee Extension  -25   -25* seated   Right Knee Flexion  86   86* seated    Left Knee Extension  -10    Left Knee Flexion  114       Ambulation/Gait   Ambulation/Gait  Yes  Ambulation/Gait Assistance  5: Supervision    Ambulation/Gait Assistance Details  excessive UE weight bearing on RW    Ambulation Distance (Feet)  100 Feet    Assistive device  Rolling walker    Gait Pattern  Step-to pattern;Decreased step length - left;Decreased stance time - right;Decreased hip/knee flexion - right;Decreased weight shift to right;Right hip hike;Right flexed knee in stance;Left flexed knee in stance;Antalgic    Ambulation Surface  Level;Indoor                  Objective measurements completed on examination: See above findings.      [facility] Adult PT Treatment/Exercise - 12/13/19 1010      Modalities   Modalities  Vasopneumatic      Vasopneumatic   Number Minutes Vasopneumatic   15 minutes    Vasopnuematic Location   Knee    Vasopneumatic Pressure  Medium    Vasopneumatic Temperature   34            HHPT exercises Ankle pumps Quad set supine Glut set supine Heel slide supine SAQ supine Heel slide seated Hip flexion seated LAQ seated Knee flex standing Marching standing Terminal knee ext standing  PT Education - 12/13/19 1056    Education Details  proper elevation with wife massaging limb distal to proximal & active ankle pumps.    Person(s) Educated  Patient;Spouse    Methods  Explanation;Demonstration;Verbal cues    Comprehension  Verbalized understanding       PT Short Term Goals - 12/13/19 1131      PT SHORT TERM GOAL #1   Title  Patient demonstrates understanding of initial HEP & edema management.    Time  1    Period  Months    Status  New    Target Date  01/07/20      PT SHORT TERM GOAL #2   Title  Right knee PROM extension </= -20* seated    Time  1    Period  Months    Status  New    Target Date  01/07/20      PT SHORT TERM GOAL #3   Title  Right knee PROM flexion </= 95* seated    Time  1    Period  Months    Status  New    Target Date  01/07/20      PT SHORT  TERM GOAL #4   Title  Patient ambulates 100' with cane with supervision    Time  1    Period  Months    Status  New    Target Date  01/07/20        PT Long Term Goals - 12/13/19 1120      PT LONG TERM GOAL #1   Title  Patient reports right knee pain </= 2/10 with standing & gait activities.  (All LTGs Target Date: 03/10/2020)    Time  12    Period  Weeks    Status  New    Target Date  03/10/20      PT LONG TERM GOAL #2   Title  Right knee PROM >/= extension 5* & flexion 110*    Time  12    Period  Weeks    Status  New    Target Date  03/10/20      PT LONG TERM GOAL #3   Title  Right knee AROM >/=  extension -10* & flexion 100*    Time  12  Period  Weeks    Status  New    Target Date  03/10/20      PT LONG TERM GOAL #4   Title  Patient ambulates >500' with cane or less outdoor surfaces modified independent.    Time  12    Period  Weeks    Status  New    Target Date  03/10/20      PT LONG TERM GOAL #5   Title  Patient negotiates ramps, curbs & stairs with single rail with cane or less modified independent.    Time  12    Period  Weeks    Status  New    Target Date  03/10/20      Additional Long Term Goals   Additional Long Term Goals  Yes      PT LONG TERM GOAL #6   Title  Edema of right knee within 1/2" of left knee    Time  12    Period  Weeks    Status  New    Target Date  03/10/20      PT LONG TERM GOAL #7   Title  Right knee strength >/= 4/5    Time  12    Period  Weeks    Status  New    Target Date  03/10/20             Plan - 12/13/19 1111    Clinical Impression Statement  This 74yo male underwent a right Total Knee Arthroplasty 19days prior to [facility] PT evaluation. He reports history of OA & Meningoencephalitis/ CVA / left TKA 2014 but was functioning with no limitations until he injured right knee getting onto tractor. He has significant edema in right knee, decreased PROM & AROM and strength.  He is dependent on RW with excessive  upper extremity weight bearing and gait deviations limiting mobility & safety.    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, CVA left lacunar infarction 2014, HTN, murmur, A-Fib,    Examination-Activity Limitations  Lift;Locomotion Level;Squat;Stairs;Stand;Transfers    Examination-Participation Restrictions  Community Activity;Yard Work    Stability/Clinical Decision Making  Stable/Uncomplicated    Clinical Decision Making  Low    Rehab Potential  Good    PT Frequency  2x / week    PT Duration  12 weeks    PT Treatment/Interventions  ADLs/Self Care Home Management;Cryotherapy;Electrical Stimulation;DME Instruction;Gait training;Stair training;Functional mobility training;Therapeutic activities;Therapeutic exercise;Balance training;Neuromuscular re-education;Patient/family education;Manual techniques;Scar mobilization;Passive range of motion;Vestibular;Taping;Joint Manipulations;Dry needling    PT Next Visit Plan  update HEP from current one with HHPT, ROM mobilizations / manual technique, vasopneumatic    PT Home Exercise Plan  see HHPT exercises in pt instruction area    Consulted and Agree with Plan of Care  Patient;Family member/caregiver    Family Member Consulted  wife       Patient will benefit from skilled therapeutic intervention in order to improve the following deficits and impairments:  Abnormal gait, Decreased activity tolerance, Decreased balance, Decreased endurance, Decreased knowledge of use of DME, Decreased mobility, Decreased range of motion, Decreased skin integrity, Decreased scar mobility, Decreased strength, Increased edema, Impaired flexibility, Postural dysfunction, Obesity, Pain  Visit Diagnosis: Other abnormalities of gait and mobility  Unsteadiness on feet  Abnormal posture  Weakness generalized  Stiffness of right knee, not elsewhere classified  Localized edema     Problem  List Patient Active Problem List  Diagnosis Date Noted   Arthritis of right knee 11/24/2019   Unilateral primary osteoarthritis, right knee    Uncontrolled type 2 diabetes mellitus with peripheral neuropathy (HCC) 03/27/2015   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   CVA (cerebral infarction) 11/16/2013   Dizziness 11/15/2013   Left sided lacunar infarction (HCC) 11/15/2013   Lacunar infarction (HCC) 06/01/2013   Acute, but ill-defined, cerebrovascular disease 06/01/2013   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   Acute CVA (cerebrovascular accident) (HCC) 03/29/2013   Numbness and tingling 03/27/2013   Hyperglycemia 03/26/2013   Meningitis 03/25/2013   Encephalopathy acute 03/23/2013   Fever 03/23/2013   OSA on CPAP 03/23/2013   Obesity 03/23/2013   Murmur 03/16/2013   Preoperative cardiovascular examination 03/16/2013   Dyslipidemia 03/16/2013   Hypertension    Cholecystitis with cholelithiasis    Apnea, sleep 01/23/2010    Vladimir Faster PT, DPT 12/13/2019, 11:39 AM  [facility] Physical Therapy [address] [place], [place], [address] Phone: [phone]   Fax:  [phone]  Name: BRAINARD HIGHFILL MRN: [id number] Date of Birth: 08-Dec-1945

## 2019-12-13 NOTE — Patient Instructions (Addendum)
HHPT exercises Ankle pumps Quad set supine Glut set supine Heel slide supine SAQ supine Heel slide seated Hip flexion seated LAQ seated Knee flex standing Marching standing Terminal knee ext standing

## 2019-12-16 ENCOUNTER — Ambulatory Visit (INDEPENDENT_AMBULATORY_CARE_PROVIDER_SITE_OTHER): Payer: Medicare Other | Admitting: Physical Therapy

## 2019-12-16 ENCOUNTER — Other Ambulatory Visit: Payer: Self-pay

## 2019-12-16 ENCOUNTER — Encounter: Payer: Self-pay | Admitting: Physical Therapy

## 2019-12-16 DIAGNOSIS — R600 Localized edema: Secondary | ICD-10-CM

## 2019-12-16 DIAGNOSIS — M25661 Stiffness of right knee, not elsewhere classified: Secondary | ICD-10-CM

## 2019-12-16 DIAGNOSIS — R531 Weakness: Secondary | ICD-10-CM | POA: Diagnosis not present

## 2019-12-16 DIAGNOSIS — R2681 Unsteadiness on feet: Secondary | ICD-10-CM | POA: Diagnosis not present

## 2019-12-16 DIAGNOSIS — R2689 Other abnormalities of gait and mobility: Secondary | ICD-10-CM

## 2019-12-16 DIAGNOSIS — R293 Abnormal posture: Secondary | ICD-10-CM

## 2019-12-16 NOTE — Therapy (Signed)
[facility] Physical Therapy [address] [place], [place], [address] Phone: [phone]   Fax:  [phone]  Physical Therapy Treatment  Patient Details  Name: Mason Patton MRN: [id number] Date of Birth: [birth year] Referring Provider (PT): Aldean Baker  MD   Encounter Date: 12/16/2019   PT End of Session - 12/16/19 1344    Visit Number 2    Number of Visits 25    Date for PT Re-Evaluation 03/10/20    Authorization Type Medicare A&B, generic commerical    PT Start Time 1305    PT Stop Time 1345    PT Time Calculation (min) 40 min    Activity Tolerance Patient limited by pain    Behavior During Therapy [facility] for tasks assessed/performed           Past Medical History:  Diagnosis Date   Aortic stenosis    09/30/19 echo ([facility], Cardiologist Dr. Clovis Riley): Mild-moderate AS, MaxVel 308 cm/s, MeanPG 22 mmHg, AVA 1.6 cm2   Arthritis    Atrial fibrillation (HCC)    Carotid artery stenosis    11/03/19 [place] ([facility]): 50-69% RICA stenosis   Cholecystitis with cholelithiasis    Eczema    Emphysema of lung (HCC)    Gallstones and inflammation of gallbladder without obstruction    Heart murmur    History of nuclear stress test 02/23/2010   dipyridamole; normal pattern of perfusion in all regions; normal, low risk study    Hyperlipidemia    Hypertension    Hypothyroidism    "had thyroid killed w/radiation" (05/19/2013)   Meningoencephalitis    Obesity    OSA on CPAP    last sleep study >8 yrs   Pneumonia    "twice" (05/19/2013)   Shortness of breath    Stroke [facility])    "they said I had a minor one when I had menigitis" (05/19/2013)   Thyroid disease    Tobacco abuse    quit 11/08/2012   Type II diabetes mellitus (HCC)    Umbilical hernia     Past Surgical History:  Procedure Laterality Date   CHOLECYSTECTOMY  09/09/2011   Procedure: LAPAROSCOPIC CHOLECYSTECTOMY WITH INTRAOPERATIVE CHOLANGIOGRAM;  Surgeon:  Jetty Duhamel, MD;  Location[facility];  Service: General;  Laterality: N/A;   HERNIA REPAIR  04/2006   UHR   JOINT REPLACEMENT     KNEE ARTHROSCOPY Right    "w2 times" (05/19/2013)   TONSILLECTOMY     TOTAL KNEE ARTHROPLASTY Left 05/19/2013   TOTAL KNEE ARTHROPLASTY Left 05/19/2013   Procedure: TOTAL KNEE ARTHROPLASTY;  Surgeon: Nadara Mustard, MD;  Location: [facility] OR;  Service: Orthopedics;  Laterality: Left;  Left Total Knee Arthroplasty   TOTAL KNEE ARTHROPLASTY Right 11/24/2019   TOTAL KNEE ARTHROPLASTY Right 11/24/2019   Procedure: RIGHT TOTAL KNEE ARTHROPLASTY;  Surgeon: Nadara Mustard, MD;  [facility] OR;  Service: Orthopedics;  Laterality: Right;   TRANSTHORACIC ECHOCARDIOGRAM  03/28/2005   EF=>55%, mod conc LVH; LA mod dilated & borderline RA enlargement; mild TR; mild pulm valve regurg    There were no vitals filed for this visit.   Subjective Assessment - 12/16/19 1308    Subjective Rt knee is hurting pretty bad today.    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 Yes    Pain Score 10-Worst pain ever   declined hospital   Pain Location Knee    Pain Orientation Right    Pain Descriptors / Indicators Aching;Sharp;Throbbing    Pain Type Surgical pain;Acute pain    Pain Onset More than a month ago    Pain Frequency Intermittent    Aggravating Factors  working on ROM    Pain Relieving Factors meds, rest                             [facility] Adult PT Treatment/Exercise - 12/16/19 1310      Exercises   Exercises Knee/Hip      Knee/Hip Exercises: Stretches   Knee: Self-Stretch to increase Flexion Right   10 x 10 sec; seated with RLE overpressure     Knee/Hip Exercises: Aerobic   Recumbent Bike partial  revolutions x 8 min; seat 8      Knee/Hip Exercises: Seated   Long Arc Quad Right;3 sets;10 reps    Long Arc Quad Limitations 5 sec hold      Knee/Hip Exercises: Supine   Heel Slides AAROM;Right;10 reps      Vasopneumatic   Number Minutes Vasopneumatic  10 minutes    Vasopnuematic Location  Knee    Vasopneumatic Pressure Medium    Vasopneumatic Temperature  34      Manual Therapy   Manual Therapy Passive ROM    Passive ROM Rt knee extension in supine; flexion in sitting with LLE LAQ 3x10                    PT Short Term Goals - 12/13/19 1131      PT SHORT TERM GOAL #1   Title Patient demonstrates understanding of initial HEP & edema management.    Time 1    Period Months    Status New    Target Date 01/07/20      PT SHORT TERM GOAL #2   Title Right knee PROM extension </= -20* seated    Time 1    Period Months    Status New    Target Date 01/07/20      PT SHORT TERM GOAL #3   Title Right knee PROM flexion </= 95* seated    Time 1    Period Months    Status New    Target Date 01/07/20      PT SHORT TERM GOAL #4   Title Patient ambulates 100' with cane with supervision    Time 1    Period Months    Status New    Target Date 01/07/20             PT Long Term Goals - 12/13/19 1120      PT LONG TERM GOAL #1   Title Patient reports right knee pain </= 2/10 with standing & gait activities.  (All LTGs Target Date: 03/10/2020)    Time 12    Period Weeks    Status New    Target Date 03/10/20      PT LONG TERM GOAL #2   Title Right knee PROM >/= extension 5* & flexion 110*    Time 12    Period Weeks    Status New    Target Date 03/10/20      PT LONG TERM GOAL #3   Title Right knee AROM >/=  extension -10* & flexion 100*  Time 12    Period Weeks    Status New    Target Date 03/10/20      PT LONG TERM GOAL #4   Title Patient ambulates >500' with cane or less outdoor surfaces modified independent.    Time 12    Period Weeks    Status New     Target Date 03/10/20      PT LONG TERM GOAL #5   Title Patient negotiates ramps, curbs & stairs with single rail with cane or less modified independent.    Time 12    Period Weeks    Status New    Target Date 03/10/20      Additional Long Term Goals   Additional Long Term Goals Yes      PT LONG TERM GOAL #6   Title Edema of right knee within 1/2" of left knee    Time 12    Period Weeks    Status New    Target Date 03/10/20      PT LONG TERM GOAL #7   Title Right knee strength >/= 4/5    Time 12    Period Weeks    Status New    Target Date 03/10/20                 Plan - 12/16/19 1344    Clinical Impression Statement Pt tolerated session well today, demonstrating good quad activation at this time.  ROM main limitation and focus for PT currently, especially end range extension.  Will continue to benefit from PT to maximize function.    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, CVA left lacunar infarction 2014, HTN, murmur, A-Fib,    Examination-Activity Limitations Lift;Locomotion Level;Squat;Stairs;Stand;Transfers    Examination-Participation Restrictions Community Activity;Yard Work    Stability/Clinical Decision Making Stable/Uncomplicated    Rehab Potential Good    PT Frequency 2x / week    PT Duration 12 weeks    PT Treatment/Interventions ADLs/Self Care Home Management;Cryotherapy;Electrical Stimulation;DME Instruction;Gait training;Stair training;Functional mobility training;Therapeutic activities;Therapeutic exercise;Balance training;Neuromuscular re-education;Patient/family education;Manual techniques;Scar mobilization;Passive range of motion;Vestibular;Taping;Joint Manipulations;Dry needling    PT Next Visit Plan update HEP from current one with HHPT, ROM mobilizations / manual technique, vasopneumatic    PT Home Exercise Plan see HHPT exercises in pt instruction area    Consulted and  Agree with Plan of Care Patient;Family member/caregiver    Family Member Consulted wife           Patient will benefit from skilled therapeutic intervention in order to improve the following deficits and impairments:  Abnormal gait, Decreased activity tolerance, Decreased balance, Decreased endurance, Decreased knowledge of use of DME, Decreased mobility, Decreased range of motion, Decreased skin integrity, Decreased scar mobility, Decreased strength, Increased edema, Impaired flexibility, Postural dysfunction, Obesity, Pain  Visit Diagnosis: Other abnormalities of gait and mobility  Unsteadiness on feet  Abnormal posture  Weakness generalized  Stiffness of right knee, not elsewhere classified  Localized edema     Problem List Patient Active Problem List   Diagnosis Date Noted   Arthritis of right knee 11/24/2019   Unilateral primary osteoarthritis, right knee    Uncontrolled type 2 diabetes mellitus with peripheral neuropathy ([place]) 03/27/2015   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   CVA (cerebral infarction) 11/16/2013   Dizziness 11/15/2013   Left sided lacunar infarction ([place]) 11/15/2013   Lacunar infarction ([place]) 06/01/2013   Acute, but ill-defined, cerebrovascular disease 06/01/2013   Meningoencephalitis  03/30/2013   Acute CVA (cerebrovascular accident) (HCC) 03/29/2013   Numbness and tingling 03/27/2013   Hyperglycemia 03/26/2013   Meningitis 03/25/2013   Encephalopathy acute 03/23/2013   Fever 03/23/2013   OSA on CPAP 03/23/2013   Obesity 03/23/2013   Murmur 03/16/2013   Preoperative cardiovascular examination 03/16/2013   Dyslipidemia 03/16/2013   Hypertension    Cholecystitis with cholelithiasis    Apnea, sleep 01/23/2010      Clarita Crane, PT, DPT 12/16/19 1:47 PM     [facility] Physical Therapy [address] [place], [place], [address] Phone: [phone]   Fax:  [phone]  Name:  Mason Patton MRN: [id number] Date of Birth: 1945-12-24

## 2019-12-20 ENCOUNTER — Other Ambulatory Visit: Payer: Self-pay

## 2019-12-20 ENCOUNTER — Encounter: Payer: Self-pay | Admitting: Physical Therapy

## 2019-12-20 ENCOUNTER — Ambulatory Visit (INDEPENDENT_AMBULATORY_CARE_PROVIDER_SITE_OTHER): Payer: Medicare Other | Admitting: Physical Therapy

## 2019-12-20 DIAGNOSIS — R531 Weakness: Secondary | ICD-10-CM | POA: Diagnosis not present

## 2019-12-20 DIAGNOSIS — R600 Localized edema: Secondary | ICD-10-CM

## 2019-12-20 DIAGNOSIS — M1711 Unilateral primary osteoarthritis, right knee: Secondary | ICD-10-CM

## 2019-12-20 DIAGNOSIS — R293 Abnormal posture: Secondary | ICD-10-CM

## 2019-12-20 DIAGNOSIS — R2689 Other abnormalities of gait and mobility: Secondary | ICD-10-CM

## 2019-12-20 DIAGNOSIS — M25661 Stiffness of right knee, not elsewhere classified: Secondary | ICD-10-CM

## 2019-12-20 DIAGNOSIS — R2681 Unsteadiness on feet: Secondary | ICD-10-CM | POA: Diagnosis not present

## 2019-12-20 DIAGNOSIS — Z96651 Presence of right artificial knee joint: Secondary | ICD-10-CM

## 2019-12-20 NOTE — Therapy (Signed)
[facility] Physical Therapy [address] [place], [place], [address] Phone: [phone]   Fax:  [phone]  Physical Therapy Treatment  Patient Details  Name: Mason Patton MRN: [id number] Date of Birth: Dec 04, 1945 Referring Provider (PT): Aldean Baker  MD   Encounter Date: 12/20/2019   PT End of Session - 12/20/19 1044    Visit Number 3    Number of Visits 25    Date for PT Re-Evaluation 03/10/20    Authorization Type Medicare A&B, generic commerical    PT Start Time 1015    PT Stop Time 1105    PT Time Calculation (min) 50 min    Activity Tolerance Patient limited by pain    Behavior During Therapy [facility] for tasks assessed/performed           Past Medical History:  Diagnosis Date  . Aortic stenosis    09/30/19 echo ([facility], Cardiologist Dr. Clovis Riley): Mild-moderate AS, MaxVel 308 cm/s, MeanPG 22 mmHg, AVA 1.6 cm2  . Arthritis   . Atrial fibrillation (HCC)   . Carotid artery stenosis    11/03/19 [place] ([facility]): 50-69% RICA stenosis  . Cholecystitis with cholelithiasis   . Eczema   . Emphysema of lung (HCC)   . Gallstones and inflammation of gallbladder without obstruction   . Heart murmur   . History of nuclear stress test 02/23/2010   dipyridamole; normal pattern of perfusion in all regions; normal, low risk study   . Hyperlipidemia   . Hypertension   . 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 Meningoencephalitis   . Obesity   . OSA on CPAP    last sleep study >8 yrs  . Pneumonia    "twice" (05/19/2013)  . Shortness of breath   . Stroke [facility])    "they said I had a minor one when I had menigitis" (05/19/2013)  . Thyroid disease   . Tobacco abuse    quit 11/08/2012  . Type II diabetes mellitus (HCC)   . Umbilical hernia     Past Surgical History:  Procedure Laterality Date  . CHOLECYSTECTOMY  09/09/2011   Procedure: LAPAROSCOPIC CHOLECYSTECTOMY WITH INTRAOPERATIVE CHOLANGIOGRAM;  Surgeon:  Jetty Duhamel, MD;  Location: [facility] OR;  Service: General;  Laterality: N/A;  . HERNIA REPAIR  04/2006   UHR  . JOINT REPLACEMENT    . KNEE ARTHROSCOPY Right    "w2 times" (05/19/2013)  . TONSILLECTOMY    . TOTAL KNEE ARTHROPLASTY Left 05/19/2013  . TOTAL KNEE ARTHROPLASTY Left 05/19/2013   Procedure: TOTAL KNEE ARTHROPLASTY;  Surgeon: Nadara Mustard, MD;  Location: [facility] OR;  Service: Orthopedics;  Laterality: Left;  Left Total Knee Arthroplasty  . TOTAL KNEE ARTHROPLASTY Right 11/24/2019  . TOTAL KNEE ARTHROPLASTY Right 11/24/2019   Procedure: RIGHT TOTAL KNEE ARTHROPLASTY;  Surgeon: Nadara Mustard, MD;  Location: [facility] OR;  Service: Orthopedics;  Laterality: Right;  . TRANSTHORACIC ECHOCARDIOGRAM  03/28/2005   EF=>55%, mod conc LVH; LA mod dilated & borderline RA enlargement; mild TR; mild pulm valve regurg    There were no vitals filed for this visit.   Subjective Assessment - 12/20/19 1037    Subjective Pt arriving to therapy reporting 3/10 pain in left knee.    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 Yes    Pain Score 3     Pain Location Knee    Pain Orientation Right    Pain Descriptors / Indicators Aching;Sore    Pain Type Surgical pain;Acute pain    Pain Onset More than a month ago              [facility] PT Assessment - 12/20/19 0001      Assessment   Medical Diagnosis right Total Knee Arthroplasty    Referring Provider (PT) Aldean Baker  MD    Onset Date/Surgical Date 11/24/19    Hand Dominance Right      PROM   PROM Assessment Site Knee    Right/Left Knee Right    Right Knee Extension 20    Right Knee Flexion 98      Ambulation/Gait   Gait Comments instructed in heel to toe gait using RW, tennis balls applied to pt's  back legs of RW.                          [facility] Adult PT Treatment/Exercise - 12/20/19 0001      Exercises   Exercises Knee/Hip      Knee/Hip Exercises: Stretches   Knee: Self-Stretch to increase Flexion Right   10 x 10 sec; seated with RLE overpressure   Gastroc Stretch Both;3 reps;20 seconds    Gastroc Stretch Limitations slant board      Knee/Hip Exercises: Aerobic   Recumbent Bike partial revolutions x 5 min; seat 7      Knee/Hip Exercises: Standing   Forward Step Up Both;10 reps;Hand Hold: 1      Knee/Hip Exercises: Seated   Long Arc Quad Right;3 sets;10 reps    Long Arc Quad Limitations 5 sec hold    Sit to [employer] 10 reps;with UE support      Knee/Hip Exercises: Supine   Heel Slides AAROM;Right;10 reps    Bridges AROM;Strengthening;Both;10 reps;Limitations    Bridges Limitations limited knee flexion and lift off     Straight Leg Raises AROM;Strengthening;Right;10 reps      Modalities   Modalities Vasopneumatic      Vasopneumatic   Number Minutes Vasopneumatic  10 minutes    Vasopnuematic Location  Knee    Vasopneumatic Pressure Medium    Vasopneumatic Temperature  34      Manual Therapy   Manual Therapy Passive ROM    Passive ROM overpressure with extension, flexion in supine and sitting                   PT Education - 12/20/19 1039    Education Details gait training, heel to toe    Person(s) Educated Patient    Methods Explanation;Demonstration    Comprehension Verbalized understanding            PT Short Term Goals - 12/20/19 1049      PT SHORT TERM GOAL #1   Title Patient demonstrates understanding of initial HEP & edema management.    Status On-going      PT SHORT TERM GOAL #2   Title Right knee PROM extension </= -20* seated    Status On-going      PT SHORT TERM GOAL #3   Title Right knee PROM flexion </= 95* seated    Status On-going      PT SHORT TERM GOAL #4   Title Patient ambulates 100' with cane with  supervision    Status  On-going             PT Long Term Goals - 12/13/19 1120      PT LONG TERM GOAL #1   Title Patient reports right knee pain </= 2/10 with standing & gait activities.  (All LTGs Target Date: 03/10/2020)    Time 12    Period Weeks    Status New    Target Date 03/10/20      PT LONG TERM GOAL #2   Title Right knee PROM >/= extension 5* & flexion 110*    Time 12    Period Weeks    Status New    Target Date 03/10/20      PT LONG TERM GOAL #3   Title Right knee AROM >/=  extension -10* & flexion 100*    Time 12    Period Weeks    Status New    Target Date 03/10/20      PT LONG TERM GOAL #4   Title Patient ambulates >500' with cane or less outdoor surfaces modified independent.    Time 12    Period Weeks    Status New    Target Date 03/10/20      PT LONG TERM GOAL #5   Title Patient negotiates ramps, curbs & stairs with single rail with cane or less modified independent.    Time 12    Period Weeks    Status New    Target Date 03/10/20      Additional Long Term Goals   Additional Long Term Goals Yes      PT LONG TERM GOAL #6   Title Edema of right knee within 1/2" of left knee    Time 12    Period Weeks    Status New    Target Date 03/10/20      PT LONG TERM GOAL #7   Title Right knee strength >/= 4/5    Time 12    Period Weeks    Status New    Target Date 03/10/20                 Plan - 12/20/19 1045    Clinical Impression Statement Pt tolerating session well with soreness reported with flexion and extension exercises. Concentration on ROM. Pt still unable to make full revolutions on the bike with seat at #7. PROM arc: Continue skilled PT to progress toward goals set.    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, CVA left lacunar infarction 2014, HTN, murmur, A-Fib,    Examination-Activity Limitations Lift;Locomotion Level;Squat;Stairs;Stand;Transfers     Examination-Participation Restrictions Community Activity;Yard Work    Stability/Clinical Decision Making Stable/Uncomplicated    Rehab Potential Good    PT Frequency 2x / week    PT Treatment/Interventions ADLs/Self Care Home Management;Cryotherapy;Electrical Stimulation;DME Instruction;Gait training;Stair training;Functional mobility training;Therapeutic activities;Therapeutic exercise;Balance training;Neuromuscular re-education;Patient/family education;Manual techniques;Scar mobilization;Passive range of motion;Vestibular;Taping;Joint Manipulations;Dry needling    PT Next Visit Plan update HEP from current one with HHPT, ROM mobilizations / manual technique, vasopneumatic    PT Home Exercise Plan see HHPT exercises in pt instruction area    Consulted and Agree with Plan of Care Patient;Family member/caregiver           Patient will benefit from skilled therapeutic intervention in order to improve the following deficits and impairments:  Abnormal gait, Decreased activity tolerance, Decreased balance, Decreased endurance, Decreased knowledge of use of DME, Decreased mobility, Decreased range  of motion, Decreased skin integrity, Decreased scar mobility, Decreased strength, Increased edema, Impaired flexibility, Postural dysfunction, Obesity, Pain  Visit Diagnosis: Other abnormalities of gait and mobility  Unsteadiness on feet  Abnormal posture  Weakness generalized  Stiffness of right knee, not elsewhere classified  Localized edema     Problem List Patient Active Problem List   Diagnosis Date Noted  . Arthritis of right knee 11/24/2019  . Unilateral primary osteoarthritis, right knee   . Uncontrolled type 2 diabetes mellitus with peripheral neuropathy ([place]) 03/27/2015  . 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 CVA (cerebral infarction) 11/16/2013  . Dizziness 11/15/2013  . Left sided lacunar infarction ([place]) 11/15/2013  . Lacunar infarction ([place]) 06/01/2013  . Acute, but  ill-defined, cerebrovascular disease 06/01/2013  . 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 Acute CVA (cerebrovascular accident) ([place]) 03/29/2013  . Numbness and tingling 03/27/2013  . Hyperglycemia 03/26/2013  . Meningitis 03/25/2013  . Encephalopathy acute 03/23/2013  . Fever 03/23/2013  . OSA on CPAP 03/23/2013  . Obesity 03/23/2013  . Murmur 03/16/2013  . Preoperative cardiovascular examination 03/16/2013  . Dyslipidemia 03/16/2013  . Hypertension   . Cholecystitis with cholelithiasis   . Apnea, sleep 01/23/2010    Oretha Caprice, PT, MPT 12/20/2019, 10:58 AM  [facility] Physical Therapy [address] [place], [place], [address] Phone: [phone]   Fax:  [phone]  Name: Mason Patton MRN: [id number] Date of Birth: Sep 20, 1945

## 2019-12-22 ENCOUNTER — Other Ambulatory Visit: Payer: Self-pay

## 2019-12-22 ENCOUNTER — Encounter: Payer: Self-pay | Admitting: Rehabilitative and Restorative Service Providers"

## 2019-12-22 ENCOUNTER — Ambulatory Visit (INDEPENDENT_AMBULATORY_CARE_PROVIDER_SITE_OTHER): Payer: Medicare Other | Admitting: Rehabilitative and Restorative Service Providers"

## 2019-12-22 DIAGNOSIS — G8929 Other chronic pain: Secondary | ICD-10-CM | POA: Diagnosis not present

## 2019-12-22 DIAGNOSIS — R2689 Other abnormalities of gait and mobility: Secondary | ICD-10-CM | POA: Diagnosis not present

## 2019-12-22 DIAGNOSIS — M25661 Stiffness of right knee, not elsewhere classified: Secondary | ICD-10-CM

## 2019-12-22 DIAGNOSIS — M25561 Pain in right knee: Secondary | ICD-10-CM | POA: Diagnosis not present

## 2019-12-22 NOTE — Patient Instructions (Signed)
Access Code: [email or web address] URL: [email or web address] Date: 12/22/2019 Prepared by: Pauletta Browns  Exercises Supine Quadricep Sets - 2-5 x daily - 7 x weekly - 20 sets - 5 reps - 5 hold Seated Passive Knee Extension - 5 x daily - 7 x weekly - 1 sets - 10 reps - 10 seconds hold Seated Knee Flexion AAROM - 5 x daily - 7 x weekly - 1 sets - 1 reps - 3 minutes hold

## 2019-12-22 NOTE — Therapy (Signed)
[facility] Physical Therapy [address] [place], [place], [address] Phone: [phone]   Fax:  [phone]  Physical Therapy Treatment  Patient Details  Name: Mason Patton MRN: [id number] Date of Birth: [birth year] Referring Provider (PT): Aldean Baker  MD   Encounter Date: 12/22/2019   PT End of Session - 12/22/19 1100    Visit Number 4    Number of Visits 25    Date for PT Re-Evaluation 03/10/20    Authorization Type Medicare A&B, generic commerical    PT Start Time 1015    PT Stop Time 1100    PT Time Calculation (min) 45 min    Activity Tolerance Patient limited by pain    Behavior During Therapy [facility] for tasks assessed/performed           Past Medical History:  Diagnosis Date   Aortic stenosis    09/30/19 echo ([facility], Cardiologist Dr. Clovis Riley): Mild-moderate AS, MaxVel 308 cm/s, MeanPG 22 mmHg, AVA 1.6 cm2   Arthritis    Atrial fibrillation (HCC)    Carotid artery stenosis    11/03/19 [place] ([facility]): 50-69% RICA stenosis   Cholecystitis with cholelithiasis    Eczema    Emphysema of lung (HCC)    Gallstones and inflammation of gallbladder without obstruction    Heart murmur    History of nuclear stress test 02/23/2010   dipyridamole; normal pattern of perfusion in all regions; normal, low risk study    Hyperlipidemia    Hypertension    Hypothyroidism    "had thyroid killed w/radiation" (05/19/2013)   Meningoencephalitis    Obesity    OSA on CPAP    last sleep study >8 yrs   Pneumonia    "twice" (05/19/2013)   Shortness of breath    Stroke [facility])    "they said I had a minor one when I had menigitis" (05/19/2013)   Thyroid disease    Tobacco abuse    quit 11/08/2012   Type II diabetes mellitus (HCC)    Umbilical hernia     Past Surgical History:  Procedure Laterality Date   CHOLECYSTECTOMY  09/09/2011   Procedure: LAPAROSCOPIC CHOLECYSTECTOMY WITH INTRAOPERATIVE CHOLANGIOGRAM;  Surgeon:  Jetty Duhamel, MD;  Location: [facility] OR;  Service: General;  Laterality: N/A;   HERNIA REPAIR  04/2006   UHR   JOINT REPLACEMENT     KNEE ARTHROSCOPY Right    "w2 times" (05/19/2013)   TONSILLECTOMY     TOTAL KNEE ARTHROPLASTY Left 05/19/2013   TOTAL KNEE ARTHROPLASTY Left 05/19/2013   Procedure: TOTAL KNEE ARTHROPLASTY;  Surgeon: Nadara Mustard, MD;  Location: [facility] OR;  Service: Orthopedics;  Laterality: Left;  Left Total Knee Arthroplasty   TOTAL KNEE ARTHROPLASTY Right 11/24/2019   TOTAL KNEE ARTHROPLASTY Right 11/24/2019   Procedure: RIGHT TOTAL KNEE ARTHROPLASTY;  Surgeon: Nadara Mustard, MD;  Location: [facility] OR;  Service: Orthopedics;  Laterality: Right;   TRANSTHORACIC ECHOCARDIOGRAM  03/28/2005   EF=>55%, mod conc LVH; LA mod dilated & borderline RA enlargement; mild TR; mild pulm valve regurg    There were no vitals filed for this visit.   Subjective Assessment - 12/22/19 1045    Subjective Don reports inconsistent compliance with his HEP.  I reinforced the importance of compliance to restore normal AROM post-TKA.    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 Yes    Pain Score 3     Pain Location Knee    Pain Orientation Right    Pain Descriptors / Indicators Aching;Sore    Pain Type Surgical pain    Pain Onset More than a month ago    Pain Frequency Intermittent    Aggravating Factors  Weight-bearing and PT    Pain Relieving Factors Ice and rest    Effect of Pain on Daily Activities Limited weight-bearing function              [facility] PT Assessment - 12/22/19 0001      PROM   PROM Assessment Site Knee    Right/Left Knee Right    Right Knee Extension 12    Right Knee Flexion 99                          [facility] Adult PT Treatment/Exercise - 12/22/19 0001      Exercises   Exercises Knee/Hip      Knee/Hip Exercises: Stretches   Other Knee/Hip Stretches Self-overpressure into extension (seated in chair with R foot in another chair) 5X 2 minutes    Other Knee/Hip Stretches Seated tailgate knee flexion AROM 3X 3 minutes      Knee/Hip Exercises: Aerobic   Stationary Bike Seat 8 8 minutes      Knee/Hip Exercises: Machines for Strengthening   Total Gym Leg Press Next visit      Knee/Hip Exercises: Supine   Quad Sets Strengthening;Both;2 sets;10 reps   5 seconds                 PT Education - 12/22/19 1107    Education Details Added 3 new activities to focus on with HEP focusing on knee extension AROM.    Person(s) Educated Patient    Methods Explanation;Demonstration;Tactile cues;Verbal cues;Handout    Comprehension Tactile cues required;Verbal cues required;Returned demonstration;Verbalized understanding;Need further instruction            PT Short Term Goals - 12/20/19 1049      PT SHORT TERM GOAL #1   Title Patient demonstrates understanding of initial HEP & edema management.    Status On-going      PT SHORT TERM GOAL #2   Title Right knee PROM extension </= -20* seated    Status On-going      PT SHORT TERM GOAL #3   Title Right knee PROM flexion </= 95* seated    Status On-going      PT SHORT TERM GOAL #4   Title Patient ambulates 100' with cane with supervision    Status On-going             PT Long Term Goals - 12/13/19 1120      PT LONG TERM GOAL #1   Title Patient reports right knee pain </= 2/10 with standing & gait activities.  (All LTGs Target Date: 03/10/2020)    Time 12    Period Weeks    Status New    Target Date 03/10/20      PT LONG TERM GOAL #2   Title Right knee PROM >/= extension 5* & flexion 110*    Time 12    Period Weeks    Status New    Target Date 03/10/20      PT LONG TERM GOAL #3   Title Right  knee  AROM >/=  extension -10* & flexion 100*    Time 12    Period Weeks    Status New    Target Date 03/10/20      PT LONG TERM GOAL #4   Title Patient ambulates >500' with cane or less outdoor surfaces modified independent.    Time 12    Period Weeks    Status New    Target Date 03/10/20      PT LONG TERM GOAL #5   Title Patient negotiates ramps, curbs & stairs with single rail with cane or less modified independent.    Time 12    Period Weeks    Status New    Target Date 03/10/20      Additional Long Term Goals   Additional Long Term Goals Yes      PT LONG TERM GOAL #6   Title Edema of right knee within 1/2" of left knee    Time 12    Period Weeks    Status New    Target Date 03/10/20      PT LONG TERM GOAL #7   Title Right knee strength >/= 4/5    Time 12    Period Weeks    Status New    Target Date 03/10/20                 Plan - 12/22/19 1101    Clinical Impression Statement Measured extension as lacking 12 degrees (was 20) and flexion at 99 degrees today.  Re-focused HEP on extension AROM.  Returning full knee extension AROM, flexion AROM, quadriceps strength and normalizing gait are remaining goals with Don's PT.    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, CVA left lacunar infarction 2014, HTN, murmur, A-Fib,    Examination-Activity Limitations Lift;Locomotion Level;Squat;Stairs;Stand;Transfers    Examination-Participation Restrictions Community Activity;Yard Work    Stability/Clinical Decision Making Stable/Uncomplicated    Rehab Potential Good    PT Frequency 2x / week    PT Treatment/Interventions ADLs/Self Care Home Management;Cryotherapy;Electrical Stimulation;DME Instruction;Gait training;Stair training;Functional mobility training;Therapeutic activities;Therapeutic exercise;Balance training;Neuromuscular re-education;Patient/family education;Manual techniques;Scar  mobilization;Passive range of motion;Vestibular;Taping;Joint Manipulations;Dry needling    PT Next Visit Plan Extension AROM, flexion AROM, quadriceps strength    PT Home Exercise Plan See patient instructions.    Consulted and Agree with Plan of Care Patient           Patient will benefit from skilled therapeutic intervention in order to improve the following deficits and impairments:  Abnormal gait, Decreased activity tolerance, Decreased balance, Decreased endurance, Decreased knowledge of use of DME, Decreased mobility, Decreased range of motion, Decreased skin integrity, Decreased scar mobility, Decreased strength, Increased edema, Impaired flexibility, Postural dysfunction, Obesity, Pain  Visit Diagnosis: Other abnormalities of gait and mobility  Stiffness of right knee, not elsewhere classified  Chronic pain of right knee     Problem List Patient Active Problem List   Diagnosis Date Noted   Arthritis of right knee 11/24/2019   Unilateral primary osteoarthritis, right knee    Uncontrolled type 2 diabetes mellitus with peripheral neuropathy (HCC) 03/27/2015   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   CVA (cerebral infarction) 11/16/2013   Dizziness 11/15/2013   Left sided lacunar infarction (HCC) 11/15/2013   Lacunar infarction (HCC) 06/01/2013   Acute, but ill-defined, cerebrovascular disease 06/01/2013   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 [place], [place], [address] Phone: [phone]   Fax:  [phone]  Name: SIM CHOQUETTE MRN: [id number] Date of Birth: [birth year]

## 2019-12-27 ENCOUNTER — Other Ambulatory Visit: Payer: Self-pay

## 2019-12-27 ENCOUNTER — Ambulatory Visit (INDEPENDENT_AMBULATORY_CARE_PROVIDER_SITE_OTHER): Payer: Medicare Other | Admitting: Physical Therapy

## 2019-12-27 ENCOUNTER — Encounter: Payer: Self-pay | Admitting: Physical Therapy

## 2019-12-27 DIAGNOSIS — R600 Localized edema: Secondary | ICD-10-CM

## 2019-12-27 DIAGNOSIS — R2689 Other abnormalities of gait and mobility: Secondary | ICD-10-CM

## 2019-12-27 DIAGNOSIS — R293 Abnormal posture: Secondary | ICD-10-CM | POA: Diagnosis not present

## 2019-12-27 DIAGNOSIS — R531 Weakness: Secondary | ICD-10-CM | POA: Diagnosis not present

## 2019-12-27 DIAGNOSIS — R2681 Unsteadiness on feet: Secondary | ICD-10-CM

## 2019-12-27 NOTE — Therapy (Signed)
[facility] Physical Therapy [address] [place], [place], [address] Phone: [phone]   Fax:  [phone]  Physical Therapy Treatment  Patient Details  Name: Mason Patton MRN: [id number] Date of Birth: 1946/06/13 Referring Provider (PT): Aldean Baker  MD   Encounter Date: 12/27/2019   PT End of Session - 12/27/19 1322    Visit Number 5    Number of Visits 25    Date for PT Re-Evaluation 03/10/20    Authorization Type Medicare A&B, generic commerical    PT Start Time 1100   Moshe Cipro, PT started initial 15 min of session   PT Stop Time 1145    PT Time Calculation (min) 45 min    Equipment Utilized During Treatment Gait belt    Activity Tolerance Patient limited by pain    Behavior During Therapy [facility] for tasks assessed/performed           Past Medical History:  Diagnosis Date  . Aortic stenosis    09/30/19 echo ([facility], Cardiologist Dr. Clovis Riley): Mild-moderate AS, MaxVel 308 cm/s, MeanPG 22 mmHg, AVA 1.6 cm2  . Arthritis   . Atrial fibrillation (HCC)   . Carotid artery stenosis    11/03/19 [place] ([facility]): 50-69% RICA stenosis  . Cholecystitis with cholelithiasis   . Eczema   . Emphysema of lung (HCC)   . Gallstones and inflammation of gallbladder without obstruction   . Heart murmur   . History of nuclear stress test 02/23/2010   dipyridamole; normal pattern of perfusion in all regions; normal, low risk study   . Hyperlipidemia   . Hypertension   . 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 Meningoencephalitis   . Obesity   . OSA on CPAP    last sleep study >8 yrs  . Pneumonia    "twice" (05/19/2013)  . Shortness of breath   . Stroke [facility])    "they said I had a minor one when I had menigitis" (05/19/2013)  . Thyroid disease   . Tobacco abuse    quit 11/08/2012  . Type II diabetes mellitus (HCC)   . Umbilical hernia     Past Surgical History:  Procedure Laterality Date  .  CHOLECYSTECTOMY  09/09/2011   Procedure: LAPAROSCOPIC CHOLECYSTECTOMY WITH INTRAOPERATIVE CHOLANGIOGRAM;  Surgeon: Jetty Duhamel, MD;  Location: [facility] OR;  Service: General;  Laterality: N/A;  . HERNIA REPAIR  04/2006   UHR  . JOINT REPLACEMENT    . KNEE ARTHROSCOPY Right    "w2 times" (05/19/2013)  . TONSILLECTOMY    . TOTAL KNEE ARTHROPLASTY Left 05/19/2013  . TOTAL KNEE ARTHROPLASTY Left 05/19/2013   Procedure: TOTAL KNEE ARTHROPLASTY;  Surgeon: Nadara Mustard, MD;  Location: [facility] OR;  Service: Orthopedics;  Laterality: Left;  Left Total Knee Arthroplasty  . TOTAL KNEE ARTHROPLASTY Right 11/24/2019  . TOTAL KNEE ARTHROPLASTY Right 11/24/2019   Procedure: RIGHT TOTAL KNEE ARTHROPLASTY;  Surgeon: Nadara Mustard, MD;  [facility] OR;  Service: Orthopedics;  Laterality: Right;  . TRANSTHORACIC ECHOCARDIOGRAM  03/28/2005   EF=>55%, mod conc LVH; LA mod dilated & borderline RA enlargement; mild TR; mild pulm valve regurg    There were no vitals filed for this visit.   Subjective Assessment - 12/27/19 1116    Subjective he has been doing his exercises. Wants to try cane for gait.    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 Yes    Pain Score 3    in last week worst 9/10,  best 0/10   Pain Location Knee    Pain Orientation Right;Anterior;Posterior;Lateral    Pain Descriptors / Indicators Aching;Sharp;Throbbing    Pain Type Surgical pain;Chronic pain    Pain Onset More than a month ago    Pain Frequency Intermittent    Aggravating Factors  exercising &walking    Pain Relieving Factors sitting to rest, heat                             [facility] Adult PT Treatment/Exercise - 12/27/19 1105      Transfers   Transfers Sit to  Stand;Stand to Sit    Sit to Stand 5: Supervision;With upper extremity assist;From chair/3-in-1;With armrests   to cane   Stand to Sit 5: Supervision;With upper extremity assist;With armrests;To chair/3-in-1   from cane     Ambulation/Gait   Ambulation/Gait Yes    Ambulation/Gait Assistance 4: Min guard;5: Supervision   min guard initially with cane & progressed to supervision   Ambulation/Gait Assistance Details PT instructed with demo, verbal & tactile cues on cane use in contralateral UE, sequencing, wt shift & upright posture.  PT explained and patient ambulated with stand alone vs standard tip on cane.  Pt prefers Stand Alone tip.  PT recommended starting with household only to limit increased pain issues and slowly over days progress distance & frequency.  Pt verbalized understanding.     Ambulation Distance (Feet) 100 Feet   100' & 50' cane stand alone tip & 100' cane std tip    Assistive device Straight cane   stand alone & std tip   Ambulation Surface Level;Indoor    Door Management 4: Min assist;5: Supervision   min guard with cane, progressed to supervision   Door Managment Details (indicate cue type and reason) tactile & verbal cues on technique    Ramp 4: Min assist   min guard with cane, progressed to supervision.    Ramp Details (indicate cue type and reason) tactile & verbal cues on technique      Exercises   Exercises Knee/Hip      Knee/Hip Exercises: Stretches   Other Knee/Hip Stretches --    Other Knee/Hip Stretches --      Knee/Hip Exercises: Aerobic   Stationary Bike Seat 8 level 1.0 for 8 minutes;  worked on ROM and only made full circle 4 times.        Knee/Hip Exercises: Machines for Strengthening   Total Gym Leg Press BLEs 100# 15 reps with cues on control & full knee extension;  RLE only 50# 15 reps.  Plan to increase wt on both next session.       Knee/Hip Exercises: Standing   Heel Raises Both;Left;Right;10 reps;5 reps;3 seconds   10 reps BLEs, 5 reps ea  single LE   Heel Raises Limitations hands on vertical wall so UE assist for balance but not lift.      Terminal Knee Extension AROM;Strengthening;Left;10 reps   3 foot positions 10 reps ea.    Terminal Knee Extension Limitations 1st position bilateral stance, 2nd position forward step / initial contact and 3rd position back step / terminal stance position with manual resist / cues for knee flexion  Knee/Hip Exercises: Supine   Quad Sets --                    PT Short Term Goals - 12/20/19 1049      PT SHORT TERM GOAL #1   Title Patient demonstrates understanding of initial HEP & edema management.    Status On-going      PT SHORT TERM GOAL #2   Title Right knee PROM extension </= -20* seated    Status On-going      PT SHORT TERM GOAL #3   Title Right knee PROM flexion </= 95* seated    Status On-going      PT SHORT TERM GOAL #4   Title Patient ambulates 100' with cane with supervision    Status On-going             PT Long Term Goals - 12/13/19 1120      PT LONG TERM GOAL #1   Title Patient reports right knee pain </= 2/10 with standing & gait activities.  (All LTGs Target Date: 03/10/2020)    Time 12    Period Weeks    Status New    Target Date 03/10/20      PT LONG TERM GOAL #2   Title Right knee PROM >/= extension 5* & flexion 110*    Time 12    Period Weeks    Status New    Target Date 03/10/20      PT LONG TERM GOAL #3   Title Right knee AROM >/=  extension -10* & flexion 100*    Time 12    Period Weeks    Status New    Target Date 03/10/20      PT LONG TERM GOAL #4   Title Patient ambulates >500' with cane or less outdoor surfaces modified independent.    Time 12    Period Weeks    Status New    Target Date 03/10/20      PT LONG TERM GOAL #5   Title Patient negotiates ramps, curbs & stairs with single rail with cane or less modified independent.    Time 12    Period Weeks    Status New    Target Date 03/10/20      Additional Long  Term Goals   Additional Long Term Goals Yes      PT LONG TERM GOAL #6   Title Edema of right knee within 1/2" of left knee    Time 12    Period Weeks    Status New    Target Date 03/10/20      PT LONG TERM GOAL #7   Title Right knee strength >/= 4/5    Time 12    Period Weeks    Status New    Target Date 03/10/20                 Plan - 12/27/19 1323    Clinical Impression Statement PT progressed gait to cane and recommended slow progression to distance & frequency as he already reports increased knee pain with longer distance gait with RW.  PT progressed knee strengthening exercises to closed kinetic chain including leg press.    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, CVA left lacunar infarction 2014, HTN, murmur, A-Fib,    Examination-Activity Limitations Lift;Locomotion Level;Squat;Stairs;Stand;Transfers    Examination-Participation Restrictions Community Activity;Yard Work    Risk analyst  Making Stable/Uncomplicated    Rehab Potential Good    PT Frequency 2x / week    PT Treatment/Interventions ADLs/Self Care Home Management;Cryotherapy;Electrical Stimulation;DME Instruction;Gait training;Stair training;Functional mobility training;Therapeutic activities;Therapeutic exercise;Balance training;Neuromuscular re-education;Patient/family education;Manual techniques;Scar mobilization;Passive range of motion;Vestibular;Taping;Joint Manipulations;Dry needling    PT Next Visit Plan Extension AROM, flexion AROM, quadriceps strength, closed kinetic chain exercises,    PT Home Exercise Plan See patient instructions.    Consulted and Agree with Plan of Care Patient           Patient will benefit from skilled therapeutic intervention in order to improve the following deficits and impairments:  Abnormal gait, Decreased activity tolerance, Decreased balance, Decreased endurance, Decreased  knowledge of use of DME, Decreased mobility, Decreased range of motion, Decreased skin integrity, Decreased scar mobility, Decreased strength, Increased edema, Impaired flexibility, Postural dysfunction, Obesity, Pain  Visit Diagnosis: Other abnormalities of gait and mobility  Unsteadiness on feet  Weakness generalized  Abnormal posture  Localized edema     Problem List Patient Active Problem List   Diagnosis Date Noted  . Arthritis of right knee 11/24/2019  . Unilateral primary osteoarthritis, right knee   . Uncontrolled type 2 diabetes mellitus with peripheral neuropathy (HCC) 03/27/2015  . 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 CVA (cerebral infarction) 11/16/2013  . Dizziness 11/15/2013  . Left sided lacunar infarction (HCC) 11/15/2013  . Lacunar infarction (HCC) 06/01/2013  . Acute, but ill-defined, cerebrovascular disease 06/01/2013  . 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 Acute CVA (cerebrovascular accident) (HCC) 03/29/2013  . Numbness and tingling 03/27/2013  . Hyperglycemia 03/26/2013  . Meningitis 03/25/2013  . Encephalopathy acute 03/23/2013  . Fever 03/23/2013  . OSA on CPAP 03/23/2013  . Obesity 03/23/2013  . Murmur 03/16/2013  . Preoperative cardiovascular examination 03/16/2013  . Dyslipidemia 03/16/2013  . Hypertension   . Cholecystitis with cholelithiasis   . Apnea, sleep 01/23/2010    Vladimir Faster PT, DPT 12/27/2019, 1:35 PM  [facility] Physical Therapy [address] [place], [place], [address] Phone: [phone]   Fax:  [phone]  Name: Mason Patton MRN: [id number] Date of Birth: 1946/03/01

## 2019-12-29 ENCOUNTER — Encounter: Payer: PRIVATE HEALTH INSURANCE | Admitting: Physical Therapy

## 2019-12-30 ENCOUNTER — Ambulatory Visit (INDEPENDENT_AMBULATORY_CARE_PROVIDER_SITE_OTHER): Payer: PRIVATE HEALTH INSURANCE | Admitting: Orthopedic Surgery

## 2019-12-30 ENCOUNTER — Encounter: Payer: Self-pay | Admitting: Orthopedic Surgery

## 2019-12-30 ENCOUNTER — Other Ambulatory Visit: Payer: Self-pay

## 2019-12-30 DIAGNOSIS — Z96651 Presence of right artificial knee joint: Secondary | ICD-10-CM

## 2020-01-03 ENCOUNTER — Encounter: Payer: Self-pay | Admitting: Physical Therapy

## 2020-01-03 ENCOUNTER — Ambulatory Visit (INDEPENDENT_AMBULATORY_CARE_PROVIDER_SITE_OTHER): Payer: Medicare Other | Admitting: Physical Therapy

## 2020-01-03 ENCOUNTER — Encounter: Payer: PRIVATE HEALTH INSURANCE | Admitting: Rehabilitative and Restorative Service Providers"

## 2020-01-03 ENCOUNTER — Encounter: Payer: Self-pay | Admitting: Orthopedic Surgery

## 2020-01-03 ENCOUNTER — Other Ambulatory Visit: Payer: Self-pay

## 2020-01-03 DIAGNOSIS — R2689 Other abnormalities of gait and mobility: Secondary | ICD-10-CM | POA: Diagnosis not present

## 2020-01-03 DIAGNOSIS — R293 Abnormal posture: Secondary | ICD-10-CM | POA: Diagnosis not present

## 2020-01-03 DIAGNOSIS — R2681 Unsteadiness on feet: Secondary | ICD-10-CM

## 2020-01-03 DIAGNOSIS — M25561 Pain in right knee: Secondary | ICD-10-CM

## 2020-01-03 DIAGNOSIS — R531 Weakness: Secondary | ICD-10-CM | POA: Diagnosis not present

## 2020-01-03 DIAGNOSIS — R600 Localized edema: Secondary | ICD-10-CM

## 2020-01-03 DIAGNOSIS — G8929 Other chronic pain: Secondary | ICD-10-CM

## 2020-01-03 DIAGNOSIS — M25661 Stiffness of right knee, not elsewhere classified: Secondary | ICD-10-CM

## 2020-01-03 NOTE — Progress Notes (Signed)
Office Visit Note   Patient: Mason Patton           Date of Birth: 07-19-1945           MRN: [id number] Visit Date: 12/30/2019              Requested by: Roger Kill, PA-C [id number] [place],  [place] [address] PCP: Roger Kill, PA-C  Chief Complaint  Patient presents with  . Right Knee - Pain      HPI: Patient is a 74 year old gentleman who presents 4 weeks status post right total knee arthroplasty he is using a cane he states he is doing much better denies much pain he states he is doing physical therapy twice a week.  Assessment & Plan: Visit Diagnoses:  1. Total knee replacement status, right     Plan: Patient was given instructions and demonstrated stretching to achieve full extension of his knee demonstrated active release.  Recommended compression stockings for his venous insufficiency.    Patient has had a recent episode of left wrist pain and we will obtain a uric acid level at follow-up.  Follow-Up Instructions: Return in about 3 weeks (around 01/20/2020).   Ortho Exam  Patient is alert, oriented, no adenopathy, well-dressed, normal affect, normal respiratory effort. Examination patient lacks about 10 degrees to full extension of the right knee there is no redness no cellulitis no signs of infection he does have venous stasis swelling in both legs.  He recently had some acute left wrist pain but he states it is much better at this time.  He does have some swelling there is no redness.  Imaging: No results found. No images are attached to the encounter.  Labs: Lab Results  Component Value Date   HGBA1C 6.9 (H) 11/22/2019   HGBA1C 7.7 07/24/2017   HGBA1C 8.3 02/03/2017   REPTSTATUS 03/27/2013 FINAL 03/23/2013   REPTSTATUS 03/23/2013 FINAL 03/23/2013   GRAMSTAIN  03/23/2013    WBC PRESENT, PREDOMINANTLY PMN NO ORGANISMS SEEN Performed at [facility] Performed at [facility]   GRAMSTAIN  03/23/2013    WBC  PRESENT, PREDOMINANTLY MONONUCLEAR NO ORGANISMS SEEN CYTOSPIN SLIDE   CULT  03/23/2013    NO GROWTH 3 DAYS Performed at [facility]     Lab Results  Component Value Date   ALBUMIN 4.1 04/12/2016   ALBUMIN 3.8 11/07/2014   ALBUMIN 3.5 11/15/2013    No results found for: MG No results found for: VD25OH  No results found for: PREALBUMIN CBC EXTENDED Latest Ref Rng & Units 11/22/2019 11/07/2014 11/15/2013  WBC 4.0 - 10.5 K/uL 9.0 9.9 9.6  RBC 4.22 - 5.81 MIL/uL 4.39 4.62 4.48  HGB 13.0 - 17.0 g/dL 12.9(L) 14.1 12.7(L)  HCT 39 - 52 % 40.3 40.9 38.4(L)  PLT 150 - 400 K/uL 205 196 201  NEUTROABS 1.7 - 7.7 K/uL - - 7.2  LYMPHSABS 0.7 - 4.0 K/uL - - 1.5     There is no height or weight on file to calculate BMI.  Orders:  No orders of the defined types were placed in this encounter.  No orders of the defined types were placed in this encounter.    Procedures: No procedures performed  Clinical Data: No additional findings.  ROS:  All other systems negative, except as noted in the HPI. Review of Systems  Objective: Vital Signs: There were no vitals taken for this visit.  Specialty Comments:  No specialty comments available.  PMFS History: Patient Active Problem List   Diagnosis Date Noted  . Arthritis of right knee 11/24/2019  . Unilateral primary osteoarthritis, right knee   . Uncontrolled type 2 diabetes mellitus with peripheral neuropathy ([place]) 03/27/2015  . 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 CVA (cerebral infarction) 11/16/2013  . Dizziness 11/15/2013  . Left sided lacunar infarction ([place]) 11/15/2013  . Lacunar infarction ([place]) 06/01/2013  . Acute, but ill-defined, cerebrovascular disease 06/01/2013  . 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 Acute CVA (cerebrovascular accident) ([place]) 03/29/2013  . Numbness and tingling 03/27/2013  . Hyperglycemia 03/26/2013  . Meningitis 03/25/2013  . Encephalopathy acute 03/23/2013  . Fever 03/23/2013  . OSA  on CPAP 03/23/2013  . Obesity 03/23/2013  . Murmur 03/16/2013  . Preoperative cardiovascular examination 03/16/2013  . Dyslipidemia 03/16/2013  . Hypertension   . Cholecystitis with cholelithiasis   . Apnea, sleep 01/23/2010   Past Medical History:  Diagnosis Date  . Aortic stenosis    09/30/19 echo ([facility], Cardiologist Dr. Geraldo Pitter): Mild-moderate AS, MaxVel 308 cm/s, MeanPG 22 mmHg, AVA 1.6 cm2  . Arthritis   . Atrial fibrillation ([place])   . Carotid artery stenosis    11/03/19 [place] ([facility]): 50-69% RICA stenosis  . Cholecystitis with cholelithiasis   . Eczema   . Emphysema of lung ([place])   . Gallstones and inflammation of gallbladder without obstruction   . Heart murmur   . History of nuclear stress test 02/23/2010   dipyridamole; normal pattern of perfusion in all regions; normal, low risk study   . Hyperlipidemia   . Hypertension   . 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 Meningoencephalitis   . Obesity   . OSA on CPAP    last sleep study >8 yrs  . Pneumonia    "twice" (05/19/2013)  . Shortness of breath   . Stroke [facility])    "they said I had a minor one when I had menigitis" (05/19/2013)  . Thyroid disease   . Tobacco abuse    quit 11/08/2012  . Type II diabetes mellitus ([place])   . Umbilical hernia     Family History  Problem Relation Age of Onset  . Cancer Maternal Aunt        colon  . Cancer Maternal Aunt        lung  . Thyroid disease Mother   . Hyperlipidemia Father   . Diabetes Maternal Grandmother   . Asthma Child   . Diabetes type I Child     Past Surgical History:  Procedure Laterality Date  . CHOLECYSTECTOMY  09/09/2011   Procedure: LAPAROSCOPIC CHOLECYSTECTOMY WITH INTRAOPERATIVE CHOLANGIOGRAM;  Surgeon: Belva Crome, MD;  Location: [place];  Service: General;  Laterality: N/A;  . HERNIA REPAIR  04/2006   UHR  . JOINT REPLACEMENT    . KNEE ARTHROSCOPY Right    "w2 times" (05/19/2013)  . TONSILLECTOMY     . TOTAL KNEE ARTHROPLASTY Left 05/19/2013  . TOTAL KNEE ARTHROPLASTY Left 05/19/2013   Procedure: TOTAL KNEE ARTHROPLASTY;  Surgeon: Newt Minion, MD;  Location: [place];  Service: Orthopedics;  Laterality: Left;  Left Total Knee Arthroplasty  . TOTAL KNEE ARTHROPLASTY Right 11/24/2019  . TOTAL KNEE ARTHROPLASTY Right 11/24/2019   Procedure: RIGHT TOTAL KNEE ARTHROPLASTY;  Surgeon: Newt Minion, MD;  Location: [place];  Service: Orthopedics;  Laterality: Right;  . TRANSTHORACIC ECHOCARDIOGRAM  03/28/2005   EF=>55%, mod conc LVH; LA mod dilated & borderline RA enlargement; mild TR; mild  pulm valve regurg   Social History   Occupational History    Employer: [employer]  Tobacco Use  . Smoking status: Current Every Day Smoker    Packs/day: 0.50    Years: 50.00    Pack years: 25.00    Types: Cigarettes  . Smokeless tobacco: Never Used  Vaping Use  . Vaping Use: Never used  Substance and Sexual Activity  . Alcohol use: No  . Drug use: No  . Sexual activity: Yes

## 2020-01-03 NOTE — Therapy (Signed)
[facility] [address] [place], [place], [address] Phone: [phone]   Fax:  [phone]  Physical Therapy Treatment  Patient Details  Name: Mason Patton MRN: [id number] Date of Birth: [birth year] Referring Provider (PT): Meridee Score  MD   Encounter Date: 01/03/2020   PT End of Session - 01/03/20 1504    Visit Number 6    Number of Visits 25    Date for PT Re-Evaluation 03/10/20    Authorization Type Medicare A&B, generic commerical    PT Start Time 1434    PT Stop Time 1512    PT Time Calculation (min) 38 min    Equipment Utilized During Treatment Gait belt    Activity Tolerance Patient limited by pain    Behavior During Therapy [facility] for tasks assessed/performed           Past Medical History:  Diagnosis Date  . Aortic stenosis    09/30/19 echo ([facility], Cardiologist Dr. Geraldo Pitter): Mild-moderate AS, MaxVel 308 cm/s, MeanPG 22 mmHg, AVA 1.6 cm2  . Arthritis   . Atrial fibrillation ([place])   . Carotid artery stenosis    11/03/19 [place] ([facility]): 50-69% RICA stenosis  . Cholecystitis with cholelithiasis   . Eczema   . Emphysema of lung ([place])   . Gallstones and inflammation of gallbladder without obstruction   . Heart murmur   . History of nuclear stress test 02/23/2010   dipyridamole; normal pattern of perfusion in all regions; normal, low risk study   . Hyperlipidemia   . Hypertension   . 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 Meningoencephalitis   . Obesity   . OSA on CPAP    last sleep study >8 yrs  . Pneumonia    "twice" (05/19/2013)  . Shortness of breath   . Stroke [facility])    "they said I had a minor one when I had menigitis" (05/19/2013)  . Thyroid disease   . Tobacco abuse    quit 11/08/2012  . Type II diabetes mellitus ([place])   . Umbilical hernia     Past Surgical History:  Procedure Laterality Date  . CHOLECYSTECTOMY  09/09/2011   Procedure: LAPAROSCOPIC  CHOLECYSTECTOMY WITH INTRAOPERATIVE CHOLANGIOGRAM;  Surgeon: Belva Crome, MD;  Location: [place];  Service: General;  Laterality: N/A;  . HERNIA REPAIR  04/2006   UHR  . JOINT REPLACEMENT    . KNEE ARTHROSCOPY Right    "w2 times" (05/19/2013)  . TONSILLECTOMY    . TOTAL KNEE ARTHROPLASTY Left 05/19/2013  . TOTAL KNEE ARTHROPLASTY Left 05/19/2013   Procedure: TOTAL KNEE ARTHROPLASTY;  Surgeon: Newt Minion, MD;  Location: [place];  Service: Orthopedics;  Laterality: Left;  Left Total Knee Arthroplasty  . TOTAL KNEE ARTHROPLASTY Right 11/24/2019  . TOTAL KNEE ARTHROPLASTY Right 11/24/2019   Procedure: RIGHT TOTAL KNEE ARTHROPLASTY;  Surgeon: Newt Minion, MD;  Location: [place];  Service: Orthopedics;  Laterality: Right;  . TRANSTHORACIC ECHOCARDIOGRAM  03/28/2005   EF=>55%, mod conc LVH; LA mod dilated & borderline RA enlargement; mild TR; mild pulm valve regurg    There were no vitals filed for this visit.   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 No/denies    Pain Onset More than a month ago              [facility] PT Assessment - 01/03/20 1453      AROM   Right Knee Extension -4    Right Knee Flexion 115                         [facility] Adult PT Treatment/Exercise - 01/03/20 1437      Knee/Hip Exercises: Stretches   Passive Hamstring Stretch Right;3 reps;30 seconds    Passive Hamstring Stretch Limitations supine with strap, overpressure at distal thigh      Knee/Hip Exercises: Aerobic   Recumbent Bike seat 9 x 8 min, L1      Knee/Hip Exercises: Machines for Strengthening   Cybex Knee Extension 10# 3x10; LLE  min A for RLE    Cybex Knee Flexion 10# RLE only 3x10      Knee/Hip Exercises: Supine   Quad Sets Strengthening;Both;2 sets;10 reps    Heel Slides AAROM;Right;2 sets;10 reps      Manual Therapy   Passive ROM overpressure with extension, flexion in supine                    PT Short Term Goals - 01/03/20 1505      PT SHORT TERM GOAL #1   Title Patient demonstrates understanding of initial HEP & edema management.    Status On-going    Target Date 01/07/20      PT SHORT TERM GOAL #2   Title Right knee PROM extension </= -20* seated    Status On-going    Target Date 01/07/20      PT SHORT TERM GOAL #3   Title Right knee PROM flexion </= 95* seated    Baseline 6/28: met 115 deg active in supine    Status Achieved      PT SHORT TERM GOAL #4   Title Patient ambulates 100' with cane with supervision    Status Achieved             PT Long Term Goals - 12/13/19 1120      PT LONG TERM GOAL #1   Title Patient reports right knee pain </= 2/10 with standing & gait activities.  (All LTGs Target Date: 03/10/2020)    Time 12    Period Weeks    Status New    Target Date 03/10/20      PT LONG TERM GOAL #2   Title Right knee PROM >/= extension 5* & flexion 110*    Time 12    Period Weeks    Status New    Target Date 03/10/20      PT LONG TERM GOAL #3   Title Right knee AROM >/=  extension -10* & flexion 100*    Time 12    Period Weeks    Status New    Target Date 03/10/20      PT LONG TERM GOAL #4   Title Patient ambulates >500' with cane or less outdoor surfaces modified independent.    Time 12    Period Weeks    Status New    Target Date 03/10/20      PT LONG TERM GOAL #5   Title Patient negotiates ramps, curbs & stairs with single rail with cane or less modified independent.  Time 12    Period Weeks    Status New    Target Date 03/10/20      Additional Long Term Goals   Additional Long Term Goals Yes      PT LONG TERM GOAL #6   Title Edema of  right knee within 1/2" of left knee    Time 12    Period Weeks    Status New    Target Date 03/10/20      PT LONG TERM GOAL #7   Title Right knee strength >/= 4/5    Time 12    Period Weeks    Status New    Target Date 03/10/20                 Plan - 01/03/20 1506    Clinical Impression Statement Pt has met 2/4 STGs and is demonstrating great progress with AROM at this time.  He is highly motivated and amb with only cane at this time.  Still with RLE weakness so continue to focus on strengthening.    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, CVA left lacunar infarction 2014, HTN, murmur, A-Fib,    Examination-Activity Limitations Lift;Locomotion Level;Squat;Stairs;Stand;Transfers    Examination-Participation Restrictions Community Activity;Yard Work    Stability/Clinical Decision Making Stable/Uncomplicated    Rehab Potential Good    PT Frequency 2x / week    PT Treatment/Interventions ADLs/Self Care Home Management;Cryotherapy;Electrical Stimulation;DME Instruction;Gait training;Stair training;Functional mobility training;Therapeutic activities;Therapeutic exercise;Balance training;Neuromuscular re-education;Patient/family education;Manual techniques;Scar mobilization;Passive range of motion;Vestibular;Taping;Joint Manipulations;Dry needling    PT Next Visit Plan Extension AROM, flexion AROM, quadriceps strength, closed kinetic chain exercises,    PT Home Exercise Plan See patient instructions.    Consulted and Agree with Plan of Care Patient           Patient will benefit from skilled therapeutic intervention in order to improve the following deficits and impairments:  Abnormal gait, Decreased activity tolerance, Decreased balance, Decreased endurance, Decreased knowledge of use of DME, Decreased mobility, Decreased range of motion, Decreased skin integrity, Decreased scar mobility, Decreased  strength, Increased edema, Impaired flexibility, Postural dysfunction, Obesity, Pain  Visit Diagnosis: Other abnormalities of gait and mobility  Unsteadiness on feet  Weakness generalized  Abnormal posture  Localized edema  Stiffness of right knee, not elsewhere classified  Chronic pain of right knee     Problem List Patient Active Problem List   Diagnosis Date Noted  . Arthritis of right knee 11/24/2019  . Unilateral primary osteoarthritis, right knee   . Uncontrolled type 2 diabetes mellitus with peripheral neuropathy ([place]) 03/27/2015  . 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 CVA (cerebral infarction) 11/16/2013  . Dizziness 11/15/2013  . Left sided lacunar infarction ([place]) 11/15/2013  . Lacunar infarction ([place]) 06/01/2013  . Acute, but ill-defined, cerebrovascular disease 06/01/2013  . 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 Acute CVA (cerebrovascular accident) ([place]) 03/29/2013  . Numbness and tingling 03/27/2013  . Hyperglycemia 03/26/2013  . Meningitis 03/25/2013  . Encephalopathy acute 03/23/2013  . Fever 03/23/2013  . OSA on CPAP 03/23/2013  . Obesity 03/23/2013  . Murmur 03/16/2013  . Preoperative cardiovascular examination 03/16/2013  . Dyslipidemia 03/16/2013  . Hypertension   . Cholecystitis with cholelithiasis   . Apnea, sleep 01/23/2010      Laureen Abrahams, PT, DPT 01/03/20 3:14 PM    [facility] [address] [place], [place], [address] Phone: [phone]   Fax:  [phone]  Name: Mason Patton MRN: [id number] Date of Birth: Aug 16, 1945

## 2020-01-05 ENCOUNTER — Ambulatory Visit (INDEPENDENT_AMBULATORY_CARE_PROVIDER_SITE_OTHER): Payer: Medicare Other | Admitting: Physical Therapy

## 2020-01-05 ENCOUNTER — Encounter: Payer: PRIVATE HEALTH INSURANCE | Admitting: Rehabilitative and Restorative Service Providers"

## 2020-01-05 ENCOUNTER — Other Ambulatory Visit: Payer: Self-pay

## 2020-01-05 ENCOUNTER — Encounter: Payer: Self-pay | Admitting: Physical Therapy

## 2020-01-05 DIAGNOSIS — R2681 Unsteadiness on feet: Secondary | ICD-10-CM | POA: Diagnosis not present

## 2020-01-05 DIAGNOSIS — R600 Localized edema: Secondary | ICD-10-CM

## 2020-01-05 DIAGNOSIS — R531 Weakness: Secondary | ICD-10-CM

## 2020-01-05 DIAGNOSIS — R293 Abnormal posture: Secondary | ICD-10-CM | POA: Diagnosis not present

## 2020-01-05 DIAGNOSIS — M25561 Pain in right knee: Secondary | ICD-10-CM

## 2020-01-05 DIAGNOSIS — G8929 Other chronic pain: Secondary | ICD-10-CM

## 2020-01-05 DIAGNOSIS — R2689 Other abnormalities of gait and mobility: Secondary | ICD-10-CM | POA: Diagnosis not present

## 2020-01-05 DIAGNOSIS — M25661 Stiffness of right knee, not elsewhere classified: Secondary | ICD-10-CM

## 2020-01-05 NOTE — Therapy (Signed)
[facility] [address] [place], [place], [address] Phone: [phone]   Fax:  [phone]  Physical Therapy Treatment  Patient Details  Name: Mason Patton MRN: [id number] Date of Birth: 1945-08-07 Referring Provider (PT): Meridee Score  MD   Encounter Date: 01/05/2020   PT End of Session - 01/05/20 1231    Visit Number 7    Number of Visits 25    Date for PT Re-Evaluation 03/10/20    Authorization Type Medicare A&B, generic commerical    PT Start Time 1147    PT Stop Time 1228    PT Time Calculation (min) 41 min    Equipment Utilized During Treatment Gait belt    Activity Tolerance Patient limited by pain    Behavior During Therapy [facility] for tasks assessed/performed           Past Medical History:  Diagnosis Date  . Aortic stenosis    09/30/19 echo ([facility], Cardiologist Dr. Geraldo Pitter): Mild-moderate AS, MaxVel 308 cm/s, MeanPG 22 mmHg, AVA 1.6 cm2  . Arthritis   . Atrial fibrillation ([place])   . Carotid artery stenosis    11/03/19 [place] ([facility]): 50-69% RICA stenosis  . Cholecystitis with cholelithiasis   . Eczema   . Emphysema of lung ([place])   . Gallstones and inflammation of gallbladder without obstruction   . Heart murmur   . History of nuclear stress test 02/23/2010   dipyridamole; normal pattern of perfusion in all regions; normal, low risk study   . Hyperlipidemia   . Hypertension   . 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 Meningoencephalitis   . Obesity   . OSA on CPAP    last sleep study >8 yrs  . Pneumonia    "twice" (05/19/2013)  . Shortness of breath   . Stroke [facility])    "they said I had a minor one when I had menigitis" (05/19/2013)  . Thyroid disease   . Tobacco abuse    quit 11/08/2012  . Type II diabetes mellitus ([place])   . Umbilical hernia     Past Surgical History:  Procedure Laterality Date  . CHOLECYSTECTOMY  09/09/2011   Procedure: LAPAROSCOPIC  CHOLECYSTECTOMY WITH INTRAOPERATIVE CHOLANGIOGRAM;  Surgeon: Belva Crome, MD;  Location: [place];  Service: General;  Laterality: N/A;  . HERNIA REPAIR  04/2006   UHR  . JOINT REPLACEMENT    . KNEE ARTHROSCOPY Right    "w2 times" (05/19/2013)  . TONSILLECTOMY    . TOTAL KNEE ARTHROPLASTY Left 05/19/2013  . TOTAL KNEE ARTHROPLASTY Left 05/19/2013   Procedure: TOTAL KNEE ARTHROPLASTY;  Surgeon: Newt Minion, MD;  Location: [place];  Service: Orthopedics;  Laterality: Left;  Left Total Knee Arthroplasty  . TOTAL KNEE ARTHROPLASTY Right 11/24/2019  . TOTAL KNEE ARTHROPLASTY Right 11/24/2019   Procedure: RIGHT TOTAL KNEE ARTHROPLASTY;  Surgeon: Newt Minion, MD;  Location: [place];  Service: Orthopedics;  Laterality: Right;  . TRANSTHORACIC ECHOCARDIOGRAM  03/28/2005   EF=>55%, mod conc LVH; LA mod dilated & borderline RA enlargement; mild TR; mild pulm valve regurg    There were no vitals filed for this visit.   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 No/denies    Pain Onset More than a month ago                             [facility] Adult PT Treatment/Exercise - 01/05/20 1149      Knee/Hip Exercises: Stretches   Passive Hamstring Stretch Right;3 reps;30 seconds    Passive Hamstring Stretch Limitations supine with strap, overpressure at distal thigh    Knee: Self-Stretch Limitations seated off mat with LLE overpressure 10x10 sec      Knee/Hip Exercises: Aerobic   Recumbent Bike seat 9 x 8 min, L1      Knee/Hip Exercises: Machines for Strengthening   Cybex Knee Extension 10# 3x10; LLE min A for RLE    Cybex Knee Flexion 10# RLE only 3x10     Cybex Leg Press RLE only 75# 3x10      Manual Therapy   Passive ROM Rt knee extension in supine; Rt knee flexion seated with LLE LAQ 3x10                    PT Short Term Goals - 01/03/20 1505      PT SHORT TERM GOAL #1   Title Patient demonstrates understanding of initial HEP & edema management.    Status On-going    Target Date 01/07/20      PT SHORT TERM GOAL #2   Title Right knee PROM extension </= -20* seated    Status On-going    Target Date 01/07/20      PT SHORT TERM GOAL #3   Title Right knee PROM flexion </= 95* seated    Baseline 6/28: met 115 deg active in supine    Status Achieved      PT SHORT TERM GOAL #4   Title Patient ambulates 100' with cane with supervision    Status Achieved             PT Long Term Goals - 12/13/19 1120      PT LONG TERM GOAL #1   Title Patient reports right knee pain </= 2/10 with standing & gait activities.  (All LTGs Target Date: 03/10/2020)    Time 12    Period Weeks    Status New    Target Date 03/10/20      PT LONG TERM GOAL #2   Title Right knee PROM >/= extension 5* & flexion 110*    Time 12    Period Weeks    Status New    Target Date 03/10/20      PT LONG TERM GOAL #3   Title Right knee AROM >/=  extension -10* & flexion 100*    Time 12    Period Weeks    Status New    Target Date 03/10/20      PT LONG TERM GOAL #4   Title Patient ambulates >500' with cane or less outdoor surfaces modified independent.    Time 12    Period Weeks    Status New    Target Date 03/10/20      PT LONG TERM GOAL #5   Title Patient negotiates ramps, curbs & stairs with single rail with cane or less modified independent.    Time 12    Period Weeks    Status New    Target Date 03/10/20      Additional Long Term Goals   Additional Long Term  Goals Yes      PT LONG TERM GOAL #6   Title Edema of right knee within 1/2" of left knee    Time 12    Period Weeks    Status New    Target Date 03/10/20      PT LONG TERM  GOAL #7   Title Right knee strength >/= 4/5    Time 12    Period Weeks    Status New    Target Date 03/10/20                 Plan - 01/05/20 1231    Clinical Impression Statement Pt tolerated session well today and is progressing well with PT.  Plan to check remaining STGs next session.    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, CVA left lacunar infarction 2014, HTN, murmur, A-Fib,    Examination-Activity Limitations Lift;Locomotion Level;Squat;Stairs;Stand;Transfers    Examination-Participation Restrictions Community Activity;Yard Work    Stability/Clinical Decision Making Stable/Uncomplicated    Rehab Potential Good    PT Frequency 2x / week    PT Treatment/Interventions ADLs/Self Care Home Management;Cryotherapy;Electrical Stimulation;DME Instruction;Gait training;Stair training;Functional mobility training;Therapeutic activities;Therapeutic exercise;Balance training;Neuromuscular re-education;Patient/family education;Manual techniques;Scar mobilization;Passive range of motion;Vestibular;Taping;Joint Manipulations;Dry needling    PT Next Visit Plan Extension AROM, flexion AROM, quadriceps strength, closed kinetic chain exercises, check STGs    PT Home Exercise Plan See patient instructions.    Consulted and Agree with Plan of Care Patient           Patient Mason benefit from skilled therapeutic intervention in order to improve the following deficits and impairments:  Abnormal gait, Decreased activity tolerance, Decreased balance, Decreased endurance, Decreased knowledge of use of DME, Decreased mobility, Decreased range of motion, Decreased skin integrity, Decreased scar mobility, Decreased strength, Increased edema, Impaired flexibility, Postural dysfunction, Obesity, Pain  Visit Diagnosis: Other abnormalities of gait and mobility  Unsteadiness on feet  Abnormal posture  Localized  edema  Stiffness of right knee, not elsewhere classified  Chronic pain of right knee  Weakness generalized     Problem List Patient Active Problem List   Diagnosis Date Noted  . Arthritis of right knee 11/24/2019  . Unilateral primary osteoarthritis, right knee   . Uncontrolled type 2 diabetes mellitus with peripheral neuropathy ([place]) 03/27/2015  . 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 CVA (cerebral infarction) 11/16/2013  . Dizziness 11/15/2013  . Left sided lacunar infarction ([place]) 11/15/2013  . Lacunar infarction ([place]) 06/01/2013  . Acute, but ill-defined, cerebrovascular disease 06/01/2013  . 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 Acute CVA (cerebrovascular accident) ([place]) 03/29/2013  . Numbness and tingling 03/27/2013  . Hyperglycemia 03/26/2013  . Meningitis 03/25/2013  . Encephalopathy acute 03/23/2013  . Fever 03/23/2013  . OSA on CPAP 03/23/2013  . Obesity 03/23/2013  . Murmur 03/16/2013  . Preoperative cardiovascular examination 03/16/2013  . Dyslipidemia 03/16/2013  . Hypertension   . Cholecystitis with cholelithiasis   . Apnea, sleep 01/23/2010      Laureen Abrahams, PT, DPT 01/05/20 12:33 PM     [facility] [address] [place], [place], [address] Phone: [phone]   Fax:  [phone]  Name: Mason Patton MRN: [id number] Date of Birth: 10-23-1945

## 2020-01-11 ENCOUNTER — Encounter: Payer: Self-pay | Admitting: Physical Therapy

## 2020-01-11 ENCOUNTER — Other Ambulatory Visit: Payer: Self-pay

## 2020-01-11 ENCOUNTER — Ambulatory Visit (INDEPENDENT_AMBULATORY_CARE_PROVIDER_SITE_OTHER): Payer: Medicare Other | Admitting: Physical Therapy

## 2020-01-11 DIAGNOSIS — R600 Localized edema: Secondary | ICD-10-CM

## 2020-01-11 DIAGNOSIS — M25661 Stiffness of right knee, not elsewhere classified: Secondary | ICD-10-CM

## 2020-01-11 DIAGNOSIS — R2689 Other abnormalities of gait and mobility: Secondary | ICD-10-CM | POA: Diagnosis not present

## 2020-01-11 DIAGNOSIS — G8929 Other chronic pain: Secondary | ICD-10-CM

## 2020-01-11 DIAGNOSIS — R2681 Unsteadiness on feet: Secondary | ICD-10-CM

## 2020-01-11 DIAGNOSIS — M25561 Pain in right knee: Secondary | ICD-10-CM

## 2020-01-11 DIAGNOSIS — R293 Abnormal posture: Secondary | ICD-10-CM

## 2020-01-11 DIAGNOSIS — M6281 Muscle weakness (generalized): Secondary | ICD-10-CM

## 2020-01-11 NOTE — Therapy (Signed)
[facility] [address] [place], [place], [address] Phone: [phone]   Fax:  [phone]  Physical Therapy Treatment  Patient Details  Name: Mason Patton MRN: [id number] Date of Birth: Dec 17, 1945 Referring Provider (PT): Meridee Score  MD   Encounter Date: 01/11/2020   PT End of Session - 01/11/20 1012    Visit Number 8    Number of Visits 25    Date for PT Re-Evaluation 03/10/20    Authorization Type Medicare A&B, generic commerical    PT Start Time 1012    PT Stop Time 1055    PT Time Calculation (min) 43 min    Equipment Utilized During Treatment Gait belt    Activity Tolerance Patient limited by pain    Behavior During Therapy [facility] for tasks assessed/performed           Past Medical History:  Diagnosis Date   Aortic stenosis    09/30/19 echo ([facility], Cardiologist Dr. Geraldo Pitter): Mild-moderate AS, MaxVel 308 cm/s, MeanPG 22 mmHg, AVA 1.6 cm2   Arthritis    Atrial fibrillation (HCC)    Carotid artery stenosis    11/03/19 [place] ([facility]): 50-69% RICA stenosis   Cholecystitis with cholelithiasis    Eczema    Emphysema of lung ([place])    Gallstones and inflammation of gallbladder without obstruction    Heart murmur    History of nuclear stress test 02/23/2010   dipyridamole; normal pattern of perfusion in all regions; normal, low risk study    Hyperlipidemia    Hypertension    Hypothyroidism    "had thyroid killed w/radiation" (05/19/2013)   Meningoencephalitis    Obesity    OSA on CPAP    last sleep study >8 yrs   Pneumonia    "twice" (05/19/2013)   Shortness of breath    Stroke [facility])    "they said I had a minor one when I had menigitis" (05/19/2013)   Thyroid disease    Tobacco abuse    quit 11/08/2012   Type II diabetes mellitus ([place])    Umbilical hernia     Past Surgical History:  Procedure Laterality Date   CHOLECYSTECTOMY  09/09/2011   Procedure: LAPAROSCOPIC CHOLECYSTECTOMY  WITH INTRAOPERATIVE CHOLANGIOGRAM;  Surgeon: Belva Crome, MD;  Location: [place];  Service: General;  Laterality: N/A;   HERNIA REPAIR  04/2006   UHR   JOINT REPLACEMENT     KNEE ARTHROSCOPY Right    "w2 times" (05/19/2013)   TONSILLECTOMY     TOTAL KNEE ARTHROPLASTY Left 05/19/2013   TOTAL KNEE ARTHROPLASTY Left 05/19/2013   Procedure: TOTAL KNEE ARTHROPLASTY;  Surgeon: Newt Minion, MD;  Location: [place];  Service: Orthopedics;  Laterality: Left;  Left Total Knee Arthroplasty   TOTAL KNEE ARTHROPLASTY Right 11/24/2019   TOTAL KNEE ARTHROPLASTY Right 11/24/2019   Procedure: RIGHT TOTAL KNEE ARTHROPLASTY;  Surgeon: Newt Minion, MD;  Location: [place];  Service: Orthopedics;  Laterality: Right;   TRANSTHORACIC ECHOCARDIOGRAM  03/28/2005   EF=>55%, mod conc LVH; LA mod dilated & borderline RA enlargement; mild TR; mild pulm valve regurg    There were no vitals filed for this visit.   Subjective Assessment - 01/11/20 1012    Subjective His exercises make him a lttle sore. He is elevating 1-3 times per day as PT recommended.    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, CVA left lacunar infarction 2014, HTN, murmur, A-Fib,    Limitations Standing;Walking;House hold activities  Patient Stated Goals to walk in community, yard work (Engineer, building services, Publishing copy, [employer]),    Pain Onset More than a month ago                             [facility] Adult PT Treatment/Exercise - 01/11/20 1015      Ambulation/Gait   Ambulation/Gait Yes    Ambulation/Gait Assistance 5: Supervision    Ambulation/Gait Assistance Details verbal cues on initial contact with heel, equal stance duration & knee extension in stance    Ambulation Distance (Feet) 300 Feet    Assistive device None    Ambulation Surface Level;Indoor      High Level Balance   High Level Balance Activities Side stepping;Direction changes;Negotiating over obstacles    High Level Balance  Comments demo & verbal cues on RLE knee motions - stepping over foam beam to facilitate knee flexion in swing, side stepping with push off, turning 90* & 180* with rotation on RLE.       Knee/Hip Exercises: Stretches   Passive Hamstring Stretch Right;3 reps;30 seconds    Passive Hamstring Stretch Limitations supine with strap, overpressure at distal thigh    Knee: Self-Stretch Limitations --    Gastroc Stretch Right;3 reps;20 seconds    Gastroc Stretch Limitations standing with forefoot on 2" block.        Knee/Hip Exercises: Aerobic   Recumbent Bike seat 10 x 8 min, L1      Knee/Hip Exercises: Machines for Strengthening   Cybex Knee Extension --    Cybex Knee Flexion --    Cybex Leg Press --      Knee/Hip Exercises: Standing   Terminal Knee Extension AROM;Strengthening;Right;10 reps;Theraband    Theraband Level (Terminal Knee Extension) Level 3 (Green)    Terminal Knee Extension Limitations 1st position bilateral stance, 2nd position forward step / initial contact and 3rd position back step / terminal stance position with manual resist / cues for knee flexion    Rocker Board 2 minutes   2 minutes ant/post & right/left   Rocker Board Limitations standing with BUE support //bars      Manual Therapy   Manual therapy comments Grade 4 mobilizations to increase knee extension.     Passive ROM Rt knee extension in supine; Rt knee flexion seated with LLE LAQ 3x10                    PT Short Term Goals - 01/11/20 1055      PT SHORT TERM GOAL #1   Title Patient demonstrates understanding of initial HEP & edema management.    Baseline met 01/11/2020    Status Achieved    Target Date 01/07/20      PT SHORT TERM GOAL #2   Title Right knee PROM extension </= -20* seated    Baseline MET 01/11/2020  PROM Knee extension -12*    Status Achieved    Target Date 01/07/20      PT SHORT TERM GOAL #3   Title Right knee PROM flexion </= 95* seated    Baseline 6/28: met 115 deg active in  supine    Status Achieved      PT SHORT TERM GOAL #4   Title Patient ambulates 100' with cane with supervision    Status Achieved             PT Long Term Goals - 12/13/19 1120  PT LONG TERM GOAL #1   Title Patient reports right knee pain </= 2/10 with standing & gait activities.  (All LTGs Target Date: 03/10/2020)    Time 12    Period Weeks    Status New    Target Date 03/10/20      PT LONG TERM GOAL #2   Title Right knee PROM >/= extension 5* & flexion 110*    Time 12    Period Weeks    Status New    Target Date 03/10/20      PT LONG TERM GOAL #3   Title Right knee AROM >/=  extension -10* & flexion 100*    Time 12    Period Weeks    Status New    Target Date 03/10/20      PT LONG TERM GOAL #4   Title Patient ambulates >500' with cane or less outdoor surfaces modified independent.    Time 12    Period Weeks    Status New    Target Date 03/10/20      PT LONG TERM GOAL #5   Title Patient negotiates ramps, curbs & stairs with single rail with cane or less modified independent.    Time 12    Period Weeks    Status New    Target Date 03/10/20      Additional Long Term Goals   Additional Long Term Goals Yes      PT LONG TERM GOAL #6   Title Edema of right knee within 1/2" of left knee    Time 12    Period Weeks    Status New    Target Date 03/10/20      PT LONG TERM GOAL #7   Title Right knee strength >/= 4/5    Time 12    Period Weeks    Status New    Target Date 03/10/20                 Plan - 01/11/20 1012    Clinical Impression Statement Patient improved knee extension in stance & knee flexion in swing with exercises & instruction.  PT worked on changing directions 79* & 180* and patient improved motion & control with instruction / practice.    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, CVA left lacunar infarction 2014, HTN, murmur, A-Fib,    Examination-Activity  Limitations Lift;Locomotion Level;Squat;Stairs;Stand;Transfers    Examination-Participation Restrictions Community Activity;Yard Work    Stability/Clinical Decision Making Stable/Uncomplicated    Rehab Potential Good    PT Frequency 2x / week    PT Treatment/Interventions ADLs/Self Care Home Management;Cryotherapy;Electrical Stimulation;DME Instruction;Gait training;Stair training;Functional mobility training;Therapeutic activities;Therapeutic exercise;Balance training;Neuromuscular re-education;Patient/family education;Manual techniques;Scar mobilization;Passive range of motion;Vestibular;Taping;Joint Manipulations;Dry needling    PT Next Visit Plan Extension AROM, flexion AROM, quadriceps strength, closed kinetic chain exercises    PT Home Exercise Plan See patient instructions.    Consulted and Agree with Plan of Care Patient           Patient will benefit from skilled therapeutic intervention in order to improve the following deficits and impairments:  Abnormal gait, Decreased activity tolerance, Decreased balance, Decreased endurance, Decreased knowledge of use of DME, Decreased mobility, Decreased range of motion, Decreased skin integrity, Decreased scar mobility, Decreased strength, Increased edema, Impaired flexibility, Postural dysfunction, Obesity, Pain  Visit Diagnosis: Other abnormalities of gait and mobility  Unsteadiness on feet  Abnormal posture  Localized edema  Stiffness of right knee, not elsewhere classified  Chronic pain of right knee  Muscle weakness (generalized)     Problem List Patient Active Problem List   Diagnosis Date Noted   Arthritis of right knee 11/24/2019   Unilateral primary osteoarthritis, right knee    Uncontrolled type 2 diabetes mellitus with peripheral neuropathy ([place]) 03/27/2015   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   CVA (cerebral infarction) 11/16/2013   Dizziness 11/15/2013   Left sided lacunar infarction ([place]) 11/15/2013    Lacunar infarction ([place]) 06/01/2013   Acute, but ill-defined, cerebrovascular disease 06/01/2013   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   Acute CVA (cerebrovascular accident) ([place]) 03/29/2013   Numbness and tingling 03/27/2013   Hyperglycemia 03/26/2013   Meningitis 03/25/2013   Encephalopathy acute 03/23/2013   Fever 03/23/2013   OSA on CPAP 03/23/2013   Obesity 03/23/2013   Murmur 03/16/2013   Preoperative cardiovascular examination 03/16/2013   Dyslipidemia 03/16/2013   Hypertension    Cholecystitis with cholelithiasis    Apnea, sleep 01/23/2010    Jamey Reas PT, DPT 01/11/2020, 10:34 PM  [facility] [address] [place], [place], [address] Phone: [phone]   Fax:  [phone]  Name: Mason Patton MRN: [id number] Date of Birth: 1945/09/04

## 2020-01-13 ENCOUNTER — Ambulatory Visit (INDEPENDENT_AMBULATORY_CARE_PROVIDER_SITE_OTHER): Payer: Medicare Other | Admitting: Physical Therapy

## 2020-01-13 ENCOUNTER — Other Ambulatory Visit: Payer: Self-pay

## 2020-01-13 ENCOUNTER — Encounter: Payer: Self-pay | Admitting: Physical Therapy

## 2020-01-13 DIAGNOSIS — R293 Abnormal posture: Secondary | ICD-10-CM | POA: Diagnosis not present

## 2020-01-13 DIAGNOSIS — R600 Localized edema: Secondary | ICD-10-CM

## 2020-01-13 DIAGNOSIS — R2681 Unsteadiness on feet: Secondary | ICD-10-CM | POA: Diagnosis not present

## 2020-01-13 DIAGNOSIS — M6281 Muscle weakness (generalized): Secondary | ICD-10-CM

## 2020-01-13 DIAGNOSIS — G8929 Other chronic pain: Secondary | ICD-10-CM

## 2020-01-13 DIAGNOSIS — M25561 Pain in right knee: Secondary | ICD-10-CM

## 2020-01-13 DIAGNOSIS — R2689 Other abnormalities of gait and mobility: Secondary | ICD-10-CM

## 2020-01-13 DIAGNOSIS — M25661 Stiffness of right knee, not elsewhere classified: Secondary | ICD-10-CM

## 2020-01-13 NOTE — Therapy (Signed)
[facility] [address] [place], [place], [address] Phone: [phone]   Fax:  [phone]  Physical Therapy Treatment  Patient Details  Name: Mason Patton MRN: [id number] Date of Birth: Nov 20, 1945 Referring Provider (PT): Meridee Score  MD   Encounter Date: 01/13/2020   PT End of Session - 01/13/20 1102    Visit Number 9    Number of Visits 25    Date for PT Re-Evaluation 03/10/20    Authorization Type Medicare A&B, generic commerical    PT Start Time 1015    PT Stop Time 1056    PT Time Calculation (min) 41 min    Equipment Utilized During Treatment Gait belt    Activity Tolerance Patient limited by pain    Behavior During Therapy [facility] for tasks assessed/performed           Past Medical History:  Diagnosis Date  . Aortic stenosis    09/30/19 echo ([facility], Cardiologist Dr. Geraldo Pitter): Mild-moderate AS, MaxVel 308 cm/s, MeanPG 22 mmHg, AVA 1.6 cm2  . Arthritis   . Atrial fibrillation ([place])   . Carotid artery stenosis    11/03/19 [place] ([facility]): 50-69% RICA stenosis  . Cholecystitis with cholelithiasis   . Eczema   . Emphysema of lung ([place])   . Gallstones and inflammation of gallbladder without obstruction   . Heart murmur   . History of nuclear stress test 02/23/2010   dipyridamole; normal pattern of perfusion in all regions; normal, low risk study   . Hyperlipidemia   . Hypertension   . 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 Meningoencephalitis   . Obesity   . OSA on CPAP    last sleep study >8 yrs  . Pneumonia    "twice" (05/19/2013)  . Shortness of breath   . Stroke [facility])    "they said I had a minor one when I had menigitis" (05/19/2013)  . Thyroid disease   . Tobacco abuse    quit 11/08/2012  . Type II diabetes mellitus ([place])   . Umbilical hernia     Past Surgical History:  Procedure Laterality Date  . CHOLECYSTECTOMY  09/09/2011   Procedure: LAPAROSCOPIC CHOLECYSTECTOMY  WITH INTRAOPERATIVE CHOLANGIOGRAM;  Surgeon: Belva Crome, MD;  Location: [place];  Service: General;  Laterality: N/A;  . HERNIA REPAIR  04/2006   UHR  . JOINT REPLACEMENT    . KNEE ARTHROSCOPY Right    "w2 times" (05/19/2013)  . TONSILLECTOMY    . TOTAL KNEE ARTHROPLASTY Left 05/19/2013  . TOTAL KNEE ARTHROPLASTY Left 05/19/2013   Procedure: TOTAL KNEE ARTHROPLASTY;  Surgeon: Newt Minion, MD;  Location: [place];  Service: Orthopedics;  Laterality: Left;  Left Total Knee Arthroplasty  . TOTAL KNEE ARTHROPLASTY Right 11/24/2019  . TOTAL KNEE ARTHROPLASTY Right 11/24/2019   Procedure: RIGHT TOTAL KNEE ARTHROPLASTY;  Surgeon: Newt Minion, MD;  Location: [place];  Service: Orthopedics;  Laterality: Right;  . TRANSTHORACIC ECHOCARDIOGRAM  03/28/2005   EF=>55%, mod conc LVH; LA mod dilated & borderline RA enlargement; mild TR; mild pulm valve regurg    There were no vitals filed for this visit.   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 No/denies    Pain Onset More than a month ago              [facility] PT Assessment - 01/13/20 1029      Assessment   Medical Diagnosis right Total Knee Arthroplasty    Referring Provider (PT) Meridee Score  MD    Onset Date/Surgical Date 11/24/19      AROM   Right Knee Extension -4    Right Knee Flexion 117                         [facility] Adult PT Treatment/Exercise - 01/13/20 1019      Knee/Hip Exercises: Stretches   Knee: Self-Stretch to increase Flexion Right   10x10 sec; seated with LLE overpressure     Knee/Hip Exercises: Aerobic   Recumbent Bike seat 9 x 8 min, L1      Knee/Hip Exercises:  Machines for Strengthening   Cybex Knee Extension 10# 3x10; LLE min A for RLE    Cybex Knee Flexion 15# RLE only 3x10      Knee/Hip Exercises: Supine   Quad Sets Strengthening;Both;2 sets;10 reps      Manual Therapy   Passive ROM Rt knee extension in supine, seated Rt knee flexion                    PT Short Term Goals - 01/11/20 1055      PT SHORT TERM GOAL #1   Title Patient demonstrates understanding of initial HEP & edema management.    Baseline met 01/11/2020    Status Achieved    Target Date 01/07/20      PT SHORT TERM GOAL #2   Title Right knee PROM extension </= -20* seated    Baseline MET 01/11/2020  PROM Knee extension -12*    Status Achieved    Target Date 01/07/20      PT SHORT TERM GOAL #3   Title Right knee PROM flexion </= 95* seated    Baseline 6/28: met 115 deg active in supine    Status Achieved      PT SHORT TERM GOAL #4   Title Patient ambulates 100' with cane with supervision    Status Achieved             PT Long Term Goals - 12/13/19 1120      PT LONG TERM GOAL #1   Title Patient reports right knee pain </= 2/10 with standing & gait activities.  (All LTGs Target Date: 03/10/2020)    Time 12    Period Weeks    Status New    Target Date 03/10/20      PT LONG TERM GOAL #2   Title Right knee PROM >/= extension 5* & flexion 110*    Time 12    Period Weeks    Status New    Target Date 03/10/20      PT LONG TERM GOAL #3   Title Right knee AROM >/=  extension -10* & flexion 100*    Time 12    Period Weeks    Status New    Target Date 03/10/20      PT LONG TERM GOAL #4   Title Patient ambulates >500' with cane or less outdoor surfaces modified independent.    Time 12    Period Weeks    Status New    Target  Date 03/10/20      PT LONG TERM GOAL #5   Title Patient negotiates ramps, curbs & stairs with single rail with cane or less modified independent.    Time 12    Period Weeks    Status New    Target Date 03/10/20       Additional Long Term Goals   Additional Long Term Goals Yes      PT LONG TERM GOAL #6   Title Edema of right knee within 1/2" of left knee    Time 12    Period Weeks    Status New    Target Date 03/10/20      PT LONG TERM GOAL #7   Title Right knee strength >/= 4/5    Time 12    Period Weeks    Status New    Target Date 03/10/20                 Plan - 01/13/20 1102    Clinical Impression Statement Pt still lacking terminal knee extension and last few degrees of extension.  Recommended he continue to focus on this at home, while maintaining flexion gains.  Overall progresing well with PT and will continue to benefit from PT to maximize function.    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, CVA left lacunar infarction 2014, HTN, murmur, A-Fib,    Examination-Activity Limitations Lift;Locomotion Level;Squat;Stairs;Stand;Transfers    Examination-Participation Restrictions Community Activity;Yard Work    Stability/Clinical Decision Making Stable/Uncomplicated    Rehab Potential Good    PT Frequency 2x / week    PT Treatment/Interventions ADLs/Self Care Home Management;Cryotherapy;Electrical Stimulation;DME Instruction;Gait training;Stair training;Functional mobility training;Therapeutic activities;Therapeutic exercise;Balance training;Neuromuscular re-education;Patient/family education;Manual techniques;Scar mobilization;Passive range of motion;Vestibular;Taping;Joint Manipulations;Dry needling    PT Next Visit Plan focus on extension/quad strength    PT Home Exercise Plan See patient instructions.    Consulted and Agree with Plan of Care Patient           Patient will benefit from skilled therapeutic intervention in order to improve the following deficits and impairments:  Abnormal gait, Decreased activity tolerance, Decreased balance, Decreased endurance, Decreased knowledge of use of DME, Decreased  mobility, Decreased range of motion, Decreased skin integrity, Decreased scar mobility, Decreased strength, Increased edema, Impaired flexibility, Postural dysfunction, Obesity, Pain  Visit Diagnosis: Other abnormalities of gait and mobility  Unsteadiness on feet  Abnormal posture  Localized edema  Stiffness of right knee, not elsewhere classified  Chronic pain of right knee  Muscle weakness (generalized)     Problem List Patient Active Problem List   Diagnosis Date Noted  . Arthritis of right knee 11/24/2019  . Unilateral primary osteoarthritis, right knee   . Uncontrolled type 2 diabetes mellitus with peripheral neuropathy ([place]) 03/27/2015  . 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 CVA (cerebral infarction) 11/16/2013  . Dizziness 11/15/2013  . Left sided lacunar infarction ([place]) 11/15/2013  . Lacunar infarction ([place]) 06/01/2013  . Acute, but ill-defined, cerebrovascular disease 06/01/2013  . 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 Acute CVA (cerebrovascular accident) ([place]) 03/29/2013  . Numbness and tingling 03/27/2013  . Hyperglycemia 03/26/2013  . Meningitis 03/25/2013  . Encephalopathy acute 03/23/2013  . Fever 03/23/2013  . OSA on CPAP 03/23/2013  . Obesity 03/23/2013  . Murmur 03/16/2013  . Preoperative cardiovascular examination 03/16/2013  . Dyslipidemia 03/16/2013  . Hypertension   . Cholecystitis with cholelithiasis   . Apnea, sleep 01/23/2010  Laureen Abrahams, PT, DPT 01/13/20 11:06 AM    [facility] [address] [place], [place], [address] Phone: [phone]   Fax:  [phone]  Name: CONAN MCMANAWAY MRN: [id number] Date of Birth: [birth year]

## 2020-01-17 ENCOUNTER — Ambulatory Visit (INDEPENDENT_AMBULATORY_CARE_PROVIDER_SITE_OTHER): Payer: Medicare Other | Admitting: Physical Therapy

## 2020-01-17 ENCOUNTER — Other Ambulatory Visit: Payer: Self-pay

## 2020-01-17 ENCOUNTER — Encounter: Payer: Self-pay | Admitting: Physical Therapy

## 2020-01-17 DIAGNOSIS — R293 Abnormal posture: Secondary | ICD-10-CM

## 2020-01-17 DIAGNOSIS — G8929 Other chronic pain: Secondary | ICD-10-CM

## 2020-01-17 DIAGNOSIS — R600 Localized edema: Secondary | ICD-10-CM | POA: Diagnosis not present

## 2020-01-17 DIAGNOSIS — R2681 Unsteadiness on feet: Secondary | ICD-10-CM

## 2020-01-17 DIAGNOSIS — R2689 Other abnormalities of gait and mobility: Secondary | ICD-10-CM

## 2020-01-17 DIAGNOSIS — M25561 Pain in right knee: Secondary | ICD-10-CM

## 2020-01-17 DIAGNOSIS — M6281 Muscle weakness (generalized): Secondary | ICD-10-CM

## 2020-01-17 DIAGNOSIS — M25661 Stiffness of right knee, not elsewhere classified: Secondary | ICD-10-CM

## 2020-01-17 NOTE — Therapy (Signed)
[facility] Physical Therapy [address] [place], [place], [address] Phone: [phone]   Fax:  [phone]  Physical Therapy Treatment  Patient Details  Name: Mason Patton MRN: [id number] Date of Birth: 1946/04/21 Referring Provider (PT): Meridee Score  MD   Progress Note Reporting Period 12/13/19 to 01/17/20  See note below for Objective Data and Assessment of Progress/Goals.        Encounter Date: 01/17/2020   PT End of Session - 01/17/20 1109    Visit Number 10    Number of Visits 25    Date for PT Re-Evaluation 03/10/20    Authorization Type Medicare A&B, generic commerical    PT Start Time 1014    PT Stop Time 1054    PT Time Calculation (min) 40 min    Equipment Utilized During Treatment Gait belt    Activity Tolerance Patient limited by pain    Behavior During Therapy WFL for tasks assessed/performed           Past Medical History:  Diagnosis Date  . Aortic stenosis    09/30/19 echo ([facility], Cardiologist Dr. Geraldo Pitter): Mild-moderate AS, MaxVel 308 cm/s, MeanPG 22 mmHg, AVA 1.6 cm2  . Arthritis   . Atrial fibrillation ([place])   . Carotid artery stenosis    11/03/19 [place] ([facility]): 50-69% RICA stenosis  . Cholecystitis with cholelithiasis   . Eczema   . Emphysema of lung ([place])   . Gallstones and inflammation of gallbladder without obstruction   . Heart murmur   . History of nuclear stress test 02/23/2010   dipyridamole; normal pattern of perfusion in all regions; normal, low risk study   . Hyperlipidemia   . Hypertension   . 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 Meningoencephalitis   . Obesity   . OSA on CPAP    last sleep study >8 yrs  . Pneumonia    "twice" (05/19/2013)  . Shortness of breath   . Stroke [facility])    "they said I had a minor one when I had menigitis" (05/19/2013)  . Thyroid disease   . Tobacco abuse    quit 11/08/2012  . Type II diabetes mellitus ([place])   . Umbilical  hernia     Past Surgical History:  Procedure Laterality Date  . CHOLECYSTECTOMY  09/09/2011   Procedure: LAPAROSCOPIC CHOLECYSTECTOMY WITH INTRAOPERATIVE CHOLANGIOGRAM;  Surgeon: Belva Crome, MD;  Location: [place];  Service: General;  Laterality: N/A;  . HERNIA REPAIR  04/2006   UHR  . JOINT REPLACEMENT    . KNEE ARTHROSCOPY Right    "w2 times" (05/19/2013)  . TONSILLECTOMY    . TOTAL KNEE ARTHROPLASTY Left 05/19/2013  . TOTAL KNEE ARTHROPLASTY Left 05/19/2013   Procedure: TOTAL KNEE ARTHROPLASTY;  Surgeon: Newt Minion, MD;  Location: [place];  Service: Orthopedics;  Laterality: Left;  Left Total Knee Arthroplasty  . TOTAL KNEE ARTHROPLASTY Right 11/24/2019  . TOTAL KNEE ARTHROPLASTY Right 11/24/2019   Procedure: RIGHT TOTAL KNEE ARTHROPLASTY;  Surgeon: Newt Minion, MD;  Location: [place];  Service: Orthopedics;  Laterality: Right;  . TRANSTHORACIC ECHOCARDIOGRAM  03/28/2005   EF=>55%, mod conc LVH; LA mod dilated & borderline RA enlargement; mild TR; mild pulm valve regurg    There were no vitals filed for this visit.   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 No/denies    Pain Onset More than a month ago              [facility] PT Assessment - 01/17/20 1109      Assessment   Medical Diagnosis right Total Knee Arthroplasty    Referring Provider (PT) Meridee Score  MD    Onset Date/Surgical Date 11/24/19    Next MD Visit 01/20/20      AROM   Right Knee Extension -4    Right Knee Flexion 117                         [facility] Adult PT Treatment/Exercise - 01/17/20 1016      Knee/Hip Exercises:  Aerobic   Recumbent Bike seat 9 x 8 min, L3      Knee/Hip Exercises: Machines for Strengthening   Cybex Knee Extension RLE only 3x10; 10#    Cybex Knee Flexion 15# RLE only 3x10      Knee/Hip Exercises: Standing   Heel Raises 3 sets;10 reps    Terminal Knee Extension Right;3 sets;10 reps;Theraband    Theraband Level (Terminal Knee Extension) Level 4 (Blue)    Terminal Knee Extension Limitations 10 sec hold    Forward Step Up Right;3 sets;10 reps;Hand Hold: 1;Step Height: 4"    Forward Step Up Limitations focusing on achieving full extension of RLE    Functional Squat 3 sets;10 reps                    PT Short Term Goals - 01/11/20 1055      PT SHORT TERM GOAL #1   Title Patient demonstrates understanding of initial HEP & edema management.    Baseline met 01/11/2020    Status Achieved    Target Date 01/07/20      PT SHORT TERM GOAL #2   Title Right knee PROM extension </= -20* seated    Baseline MET 01/11/2020  PROM Knee extension -12*    Status Achieved    Target Date 01/07/20      PT SHORT TERM GOAL #3   Title Right knee PROM flexion </= 95* seated    Baseline 6/28: met 115 deg active in supine    Status Achieved      PT SHORT TERM GOAL #4   Title Patient ambulates 100' with cane with supervision    Status Achieved             PT Long Term Goals - 01/17/20 1109      PT LONG TERM GOAL #1   Title Patient reports right knee pain </= 2/10 with standing & gait activities.  (All LTGs Target Date: 03/10/2020)    Time 12    Period Weeks    Status Achieved      PT LONG TERM GOAL #2   Title Right knee PROM >/= extension 5* & flexion 110*    Time 12    Period Weeks    Status Achieved      PT LONG TERM GOAL #3   Title Right knee AROM >/=  extension -2*    Baseline 7/12, initial goal met of 0-10-100, revised today    Time 12  Period Weeks    Status Revised    Target Date 03/10/20      PT LONG TERM GOAL #4   Title Patient ambulates >500' with cane or less  outdoor surfaces modified independent.    Time 12    Period Weeks    Status On-going    Target Date 03/10/20      PT LONG TERM GOAL #5   Title Patient negotiates ramps, curbs & stairs with single rail with cane or less modified independent.    Time 12    Period Weeks    Status On-going    Target Date 03/10/20      PT LONG TERM GOAL #6   Title Edema of right knee within 1/2" of left knee    Time 12    Period Weeks    Status On-going    Target Date 03/10/20      PT LONG TERM GOAL #7   Title Right knee strength >/= 4/5    Time 12    Period Weeks    Status On-going    Target Date 03/10/20                 Plan - 01/17/20 1111    Clinical Impression Statement Pt continues to progress well, noting mild strength  deficits in stance on RLE.  Session today focused on terminal knee extension strength.  Will continue to benefit from PT to maximize function.    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, CVA left lacunar infarction 2014, HTN, murmur, A-Fib,    Examination-Activity Limitations Lift;Locomotion Level;Squat;Stairs;Stand;Transfers    Examination-Participation Restrictions Community Activity;Yard Work    Stability/Clinical Decision Making Stable/Uncomplicated    Rehab Potential Good    PT Frequency 2x / week    PT Treatment/Interventions ADLs/Self Care Home Management;Cryotherapy;Electrical Stimulation;DME Instruction;Gait training;Stair training;Functional mobility training;Therapeutic activities;Therapeutic exercise;Balance training;Neuromuscular re-education;Patient/family education;Manual techniques;Scar mobilization;Passive range of motion;Vestibular;Taping;Joint Manipulations;Dry needling    PT Next Visit Plan focus on extension/quad strength, needs MD note    PT Home Exercise Plan See patient instructions.    Consulted and Agree with Plan of Care Patient           Patient will benefit  from skilled therapeutic intervention in order to improve the following deficits and impairments:  Abnormal gait, Decreased activity tolerance, Decreased balance, Decreased endurance, Decreased knowledge of use of DME, Decreased mobility, Decreased range of motion, Decreased skin integrity, Decreased scar mobility, Decreased strength, Increased edema, Impaired flexibility, Postural dysfunction, Obesity, Pain  Visit Diagnosis: Other abnormalities of gait and mobility  Unsteadiness on feet  Abnormal posture  Localized edema  Stiffness of right knee, not elsewhere classified  Chronic pain of right knee  Muscle weakness (generalized)     Problem List Patient Active Problem List   Diagnosis Date Noted  . Arthritis of right knee 11/24/2019  . Unilateral primary osteoarthritis, right knee   . Uncontrolled type 2 diabetes mellitus with peripheral neuropathy ([place]) 03/27/2015  . 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 CVA (cerebral infarction) 11/16/2013  . Dizziness 11/15/2013  . Left sided lacunar infarction ([place]) 11/15/2013  . Lacunar infarction ([place]) 06/01/2013  . Acute, but ill-defined, cerebrovascular disease 06/01/2013  . 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 Acute CVA (cerebrovascular accident) ([place]) 03/29/2013  . Numbness and tingling 03/27/2013  . Hyperglycemia 03/26/2013  . Meningitis 03/25/2013  . Encephalopathy acute 03/23/2013  . Fever 03/23/2013  . OSA on CPAP 03/23/2013  . Obesity  03/23/2013  . Murmur 03/16/2013  . Preoperative cardiovascular examination 03/16/2013  . Dyslipidemia 03/16/2013  . Hypertension   . Cholecystitis with cholelithiasis   . Apnea, sleep 01/23/2010      Laureen Abrahams, PT, DPT 01/17/20 11:12 AM    [facility] Physical Therapy [address] [place], [place], [address] Phone: [phone]   Fax:  [phone]  Name: Mason Patton MRN: [id number] Date of Birth: 02/19/1946

## 2020-01-19 ENCOUNTER — Ambulatory Visit (INDEPENDENT_AMBULATORY_CARE_PROVIDER_SITE_OTHER): Payer: Medicare Other | Admitting: Physical Therapy

## 2020-01-19 ENCOUNTER — Encounter: Payer: Self-pay | Admitting: Physical Therapy

## 2020-01-19 ENCOUNTER — Other Ambulatory Visit: Payer: Self-pay

## 2020-01-19 DIAGNOSIS — R293 Abnormal posture: Secondary | ICD-10-CM

## 2020-01-19 DIAGNOSIS — M25561 Pain in right knee: Secondary | ICD-10-CM

## 2020-01-19 DIAGNOSIS — R2689 Other abnormalities of gait and mobility: Secondary | ICD-10-CM

## 2020-01-19 DIAGNOSIS — R2681 Unsteadiness on feet: Secondary | ICD-10-CM | POA: Diagnosis not present

## 2020-01-19 DIAGNOSIS — R600 Localized edema: Secondary | ICD-10-CM | POA: Diagnosis not present

## 2020-01-19 DIAGNOSIS — M25661 Stiffness of right knee, not elsewhere classified: Secondary | ICD-10-CM

## 2020-01-19 DIAGNOSIS — M6281 Muscle weakness (generalized): Secondary | ICD-10-CM

## 2020-01-19 DIAGNOSIS — G8929 Other chronic pain: Secondary | ICD-10-CM

## 2020-01-19 NOTE — Therapy (Addendum)
[facility] Physical Therapy [address] [place], [place], [address] Phone: [phone]   Fax:  [phone]  Physical Therapy Treatment/Discharge Summary  Patient Details  Name: Mason Patton MRN: [id number] Date of Birth: 11-18-1945 Referring Provider (PT): Meridee Score  MD   Encounter Date: 01/19/2020   PT End of Session - 01/19/20 1051    Visit Number 11    Number of Visits 25    Date for PT Re-Evaluation 03/10/20    Authorization Type Medicare A&B, generic commerical    PT Start Time 1005    PT Stop Time 1045    PT Time Calculation (min) 40 min    Activity Tolerance Patient limited by pain    Behavior During Therapy [facility] for tasks assessed/performed           Past Medical History:  Diagnosis Date  . Aortic stenosis    09/30/19 echo ([facility], Cardiologist Dr. Geraldo Pitter): Mild-moderate AS, MaxVel 308 cm/s, MeanPG 22 mmHg, AVA 1.6 cm2  . Arthritis   . Atrial fibrillation ([place])   . Carotid artery stenosis    11/03/19 [place] ([facility]): 50-69% RICA stenosis  . Cholecystitis with cholelithiasis   . Eczema   . Emphysema of lung ([place])   . Gallstones and inflammation of gallbladder without obstruction   . Heart murmur   . History of nuclear stress test 02/23/2010   dipyridamole; normal pattern of perfusion in all regions; normal, low risk study   . Hyperlipidemia   . Hypertension   . 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 Meningoencephalitis   . Obesity   . OSA on CPAP    last sleep study >8 yrs  . Pneumonia    "twice" (05/19/2013)  . Shortness of breath   . Stroke [facility])    "they said I had a minor one when I had menigitis" (05/19/2013)  . Thyroid disease   . Tobacco abuse    quit 11/08/2012  . Type II diabetes mellitus ([place])   . Umbilical hernia     Past Surgical History:  Procedure Laterality Date  . CHOLECYSTECTOMY  09/09/2011   Procedure: LAPAROSCOPIC CHOLECYSTECTOMY WITH INTRAOPERATIVE  CHOLANGIOGRAM;  Surgeon: Belva Crome, MD;  Location: [place];  Service: General;  Laterality: N/A;  . HERNIA REPAIR  04/2006   UHR  . JOINT REPLACEMENT    . KNEE ARTHROSCOPY Right    "w2 times" (05/19/2013)  . TONSILLECTOMY    . TOTAL KNEE ARTHROPLASTY Left 05/19/2013  . TOTAL KNEE ARTHROPLASTY Left 05/19/2013   Procedure: TOTAL KNEE ARTHROPLASTY;  Surgeon: Newt Minion, MD;  Location: [place];  Service: Orthopedics;  Laterality: Left;  Left Total Knee Arthroplasty  . TOTAL KNEE ARTHROPLASTY Right 11/24/2019  . TOTAL KNEE ARTHROPLASTY Right 11/24/2019   Procedure: RIGHT TOTAL KNEE ARTHROPLASTY;  Surgeon: Newt Minion, MD;  Location: [place];  Service: Orthopedics;  Laterality: Right;  . TRANSTHORACIC ECHOCARDIOGRAM  03/28/2005   EF=>55%, mod conc LVH; LA mod dilated & borderline RA enlargement; mild TR; mild pulm valve regurg    There were no vitals filed for this visit.   Subjective Assessment - 01/19/20 1007    Subjective doing well, sees MD tomorrow.    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 No/denies    Pain Onset More than a month ago              [facility] PT Assessment - 01/19/20 1026      Assessment   Medical Diagnosis right Total Knee Arthroplasty    Referring Provider (PT) Meridee Score  MD    Onset Date/Surgical Date 11/24/19      Circumferential Edema   Circumferential - Right mid patella 18.5"    Circumferential - Left  mid patella 18"      AROM   Right Knee Extension -4    Right Knee Flexion 117      Strength   Strength Assessment Site Knee    Right/Left Knee Right    Right Knee Flexion 4-/5    Right Knee Extension 5/5      Ambulation/Gait   Ambulation/Gait Assistance 7: Independent    Ambulation/Gait  Assistance Details seated rest break needed due to fatigue at end of session    Ambulation Distance (Feet) 500 Feet    Assistive device None    Gait Pattern --   decreased TKE on Rt   Stairs Yes    Stairs Assistance 6: Modified independent (Device/Increase time)    Stair Management Technique One rail Left;Alternating pattern;Forwards    Number of Stairs 18    Height of Stairs 8    Ramp 7: Independent    Curb 7: Independent                         [facility] Adult PT Treatment/Exercise - 01/19/20 1007      Knee/Hip Exercises: Stretches   Passive Hamstring Stretch Right;3 reps;30 seconds    Passive Hamstring Stretch Limitations seated with overpressure at distal thigh      Knee/Hip Exercises: Aerobic   Recumbent Bike seat 9 x 8 min, L3      Knee/Hip Exercises: Supine   Quad Sets Strengthening;2 sets;10 reps;Right    Quad Sets Limitations with heel prop on towel    Heel Slides AAROM;Right;2 sets;10 reps                    PT Short Term Goals - 01/11/20 1055      PT SHORT TERM GOAL #1   Title Patient demonstrates understanding of initial HEP & edema management.    Baseline met 01/11/2020    Status Achieved    Target Date 01/07/20      PT SHORT TERM GOAL #2   Title Right knee PROM extension </= -20* seated    Baseline MET 01/11/2020  PROM Knee extension -12*    Status Achieved    Target Date 01/07/20      PT SHORT TERM GOAL #3   Title Right knee PROM flexion </= 95* seated    Baseline 6/28: met 115 deg active in supine    Status Achieved      PT SHORT TERM GOAL #4   Title Patient ambulates 100' with cane with supervision    Status Achieved             PT Long Term Goals - 01/19/20 1051      PT LONG TERM GOAL #1   Title Patient reports right knee pain </= 2/10 with standing & gait activities.  (All LTGs Target Date: 03/10/2020)    Time 12    Period Weeks    Status Achieved      PT LONG TERM GOAL #2  Title Right knee PROM >/= extension 5* &  flexion 110*    Time 12    Period Weeks    Status Achieved      PT LONG TERM GOAL #3   Title Right knee AROM >/=  extension -2*    Baseline 7/12, initial goal met of 0-10-100, revised today    Time 12    Period Weeks    Status On-going      PT LONG TERM GOAL #4   Title Patient ambulates >500' with cane or less outdoor surfaces modified independent.    Time 12    Period Weeks    Status Achieved      PT LONG TERM GOAL #5   Title Patient negotiates ramps, curbs & stairs with single rail with cane or less modified independent.    Time 12    Period Weeks    Status Achieved      PT LONG TERM GOAL #6   Title Edema of right knee within 1/2" of left knee    Time 12    Period Weeks    Status Achieved      PT LONG TERM GOAL #7   Title Right knee strength >/= 4/5    Time 12    Period Weeks    Status Partially Met                 Plan - 01/19/20 1051    Clinical Impression Statement Pt has partially met/met nearly all goals at this time.  He is pleased with his progress and feels he may be ready for d/c.  He still lacks terminal knee extension and quad strength with home exercises focusing on these.  Would like to see 1-2 more weeks to focus on this, but pt would like to see MD before making any decisions about continued PT.    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, CVA left lacunar infarction 2014, HTN, murmur, A-Fib,    Examination-Activity Limitations Lift;Locomotion Level;Squat;Stairs;Stand;Transfers    Examination-Participation Restrictions Community Activity;Yard Work    Stability/Clinical Decision Making Stable/Uncomplicated    Rehab Potential Good    PT Frequency 2x / week    PT Treatment/Interventions ADLs/Self Care Home Management;Cryotherapy;Electrical Stimulation;DME Instruction;Gait training;Stair training;Functional mobility training;Therapeutic activities;Therapeutic exercise;Balance  training;Neuromuscular re-education;Patient/family education;Manual techniques;Scar mobilization;Passive range of motion;Vestibular;Taping;Joint Manipulations;Dry needling    PT Next Visit Plan possible d/c?, see what MD says, otherwise focus on TKE and quad strength    PT Home Exercise Plan See patient instructions.    Consulted and Agree with Plan of Care Patient           Patient will benefit from skilled therapeutic intervention in order to improve the following deficits and impairments:  Abnormal gait, Decreased activity tolerance, Decreased balance, Decreased endurance, Decreased knowledge of use of DME, Decreased mobility, Decreased range of motion, Decreased skin integrity, Decreased scar mobility, Decreased strength, Increased edema, Impaired flexibility, Postural dysfunction, Obesity, Pain  Visit Diagnosis: Other abnormalities of gait and mobility  Unsteadiness on feet  Abnormal posture  Localized edema  Stiffness of right knee, not elsewhere classified  Chronic pain of right knee  Muscle weakness (generalized)     Problem List Patient Active Problem List   Diagnosis Date Noted  . Arthritis of right knee 11/24/2019  . Unilateral primary osteoarthritis, right knee   . Uncontrolled type 2 diabetes mellitus with peripheral neuropathy ([place]) 03/27/2015  . 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  CVA (cerebral infarction) 11/16/2013  . Dizziness 11/15/2013  . Left sided lacunar infarction ([place]) 11/15/2013  . Lacunar infarction ([place]) 06/01/2013  . Acute, but ill-defined, cerebrovascular disease 06/01/2013  . 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 Acute CVA (cerebrovascular accident) ([place]) 03/29/2013  . Numbness and tingling 03/27/2013  . Hyperglycemia 03/26/2013  . Meningitis 03/25/2013  . Encephalopathy acute 03/23/2013  . Fever 03/23/2013  . OSA on CPAP 03/23/2013  . Obesity 03/23/2013  . Murmur 03/16/2013  . Preoperative cardiovascular examination 03/16/2013  .  Dyslipidemia 03/16/2013  . Hypertension   . Cholecystitis with cholelithiasis   . Apnea, sleep 01/23/2010      Laureen Abrahams, PT, DPT 01/19/20 10:54 AM     [facility] Physical Therapy [address] [place], [place], [address] Phone: [phone]   Fax:  [phone]  Name: NIELS CRANSHAW MRN: [id number] Date of Birth: March 08, 1946    PHYSICAL THERAPY DISCHARGE SUMMARY  Visits from Start of Care: 11  Current functional level related to goals / functional outcomes: See above   Remaining deficits: See above   Education / Equipment: HEP  Plan: Patient agrees to discharge.  Patient goals were partially met. Patient is being discharged due to being pleased with the current functional level.  ?????     Laureen Abrahams, PT, DPT 02/17/20 11:34 AM  [facility] Physical Therapy [address] [place], [place], [address] Phone: [phone]   Fax:  [phone]

## 2020-01-20 ENCOUNTER — Ambulatory Visit: Payer: PRIVATE HEALTH INSURANCE | Admitting: Orthopedic Surgery

## 2020-01-20 ENCOUNTER — Ambulatory Visit (INDEPENDENT_AMBULATORY_CARE_PROVIDER_SITE_OTHER): Payer: PRIVATE HEALTH INSURANCE | Admitting: Physician Assistant

## 2020-01-20 ENCOUNTER — Encounter: Payer: Self-pay | Admitting: Orthopedic Surgery

## 2020-01-20 DIAGNOSIS — Z96651 Presence of right artificial knee joint: Secondary | ICD-10-CM

## 2020-01-20 NOTE — Progress Notes (Signed)
Office Visit Note   Patient: Mason Patton           Date of Birth: 1946-02-26           MRN: [id number] Visit Date: 01/20/2020              Requested by: Roger Kill, PA-C [address] [place],  [place] [address] PCP: Roger Kill, PA-C  Chief Complaint  Patient presents with  . Right Knee - Pain, Follow-up      HPI: This is a pleasant gentleman who is 8 weeks status post right total knee arthroplasty.  He has been working visit with physical therapy and very pleased with his results  Assessment & Plan: Visit Diagnoses: No diagnosis found.  Plan: He will follow up at the 71-month anniversary of his knee replacement.  He may return to activities as tolerated he will continue to work on a home exercise program  Follow-Up Instructions: No follow-ups on file.   Ortho Exam  Patient is alert, oriented, no adenopathy, well-dressed, normal affect, normal respiratory effort. Right knee well-healed surgical incision.  No effusion no cellulitis he has lacking 2 to 3 degrees of full extension and can flex to 117 degrees.  Compartments are soft and nontender no evidence of any infection or cellulitis  Imaging: No results found. No images are attached to the encounter.  Labs: Lab Results  Component Value Date   HGBA1C 6.9 (H) 11/22/2019   HGBA1C 7.7 07/24/2017   HGBA1C 8.3 02/03/2017   REPTSTATUS 03/27/2013 FINAL 03/23/2013   REPTSTATUS 03/23/2013 FINAL 03/23/2013   GRAMSTAIN  03/23/2013    WBC PRESENT, PREDOMINANTLY PMN NO ORGANISMS SEEN Performed at [facility] Performed at [facility]   GRAMSTAIN  03/23/2013    WBC PRESENT, PREDOMINANTLY MONONUCLEAR NO ORGANISMS SEEN CYTOSPIN SLIDE   CULT  03/23/2013    NO GROWTH 3 DAYS Performed at [facility]     Lab Results  Component Value Date   ALBUMIN 4.1 04/12/2016   ALBUMIN 3.8 11/07/2014   ALBUMIN 3.5 11/15/2013    No results found for: MG No results found for:  VD25OH  No results found for: PREALBUMIN CBC EXTENDED Latest Ref Rng & Units 11/22/2019 11/07/2014 11/15/2013  WBC 4.0 - 10.5 K/uL 9.0 9.9 9.6  RBC 4.22 - 5.81 MIL/uL 4.39 4.62 4.48  HGB 13.0 - 17.0 g/dL 12.9(L) 14.1 12.7(L)  HCT 39 - 52 % 40.3 40.9 38.4(L)  PLT 150 - 400 K/uL 205 196 201  NEUTROABS 1.7 - 7.7 K/uL - - 7.2  LYMPHSABS 0.7 - 4.0 K/uL - - 1.5     There is no height or weight on file to calculate BMI.  Orders:  No orders of the defined types were placed in this encounter.  No orders of the defined types were placed in this encounter.    Procedures: No procedures performed  Clinical Data: No additional findings.  ROS:  All other systems negative, except as noted in the HPI. Review of Systems  Objective: Vital Signs: There were no vitals taken for this visit.  Specialty Comments:  No specialty comments available.  PMFS History: Patient Active Problem List   Diagnosis Date Noted  . Arthritis of right knee 11/24/2019  . Unilateral primary osteoarthritis, right knee   . Uncontrolled type 2 diabetes mellitus with peripheral neuropathy (HCC) 03/27/2015  . 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 CVA (cerebral infarction) 11/16/2013  . Dizziness 11/15/2013  . Left sided lacunar  infarction (HCC) 11/15/2013  . Lacunar infarction (HCC) 06/01/2013  . Acute, but ill-defined, cerebrovascular disease 06/01/2013  . 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 Acute CVA (cerebrovascular accident) (HCC) 03/29/2013  . Numbness and tingling 03/27/2013  . Hyperglycemia 03/26/2013  . Meningitis 03/25/2013  . Encephalopathy acute 03/23/2013  . Fever 03/23/2013  . OSA on CPAP 03/23/2013  . Obesity 03/23/2013  . Murmur 03/16/2013  . Preoperative cardiovascular examination 03/16/2013  . Dyslipidemia 03/16/2013  . Hypertension   . Cholecystitis with cholelithiasis   . Apnea, sleep 01/23/2010   Past Medical History:  Diagnosis Date  . Aortic stenosis    09/30/19 echo  ([facility], Cardiologist Dr. Clovis Riley): Mild-moderate AS, MaxVel 308 cm/s, MeanPG 22 mmHg, AVA 1.6 cm2  . Arthritis   . Atrial fibrillation (HCC)   . Carotid artery stenosis    11/03/19 [place] ([facility]): 50-69% RICA stenosis  . Cholecystitis with cholelithiasis   . Eczema   . Emphysema of lung (HCC)   . Gallstones and inflammation of gallbladder without obstruction   . Heart murmur   . History of nuclear stress test 02/23/2010   dipyridamole; normal pattern of perfusion in all regions; normal, low risk study   . Hyperlipidemia   . Hypertension   . 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 Meningoencephalitis   . Obesity   . OSA on CPAP    last sleep study >8 yrs  . Pneumonia    "twice" (05/19/2013)  . Shortness of breath   . Stroke [facility])    "they said I had a minor one when I had menigitis" (05/19/2013)  . Thyroid disease   . Tobacco abuse    quit 11/08/2012  . Type II diabetes mellitus (HCC)   . Umbilical hernia     Family History  Problem Relation Age of Onset  . Cancer Maternal Aunt        colon  . Cancer Maternal Aunt        lung  . Thyroid disease Mother   . Hyperlipidemia Father   . Diabetes Maternal Grandmother   . Asthma Child   . Diabetes type I Child     Past Surgical History:  Procedure Laterality Date  . CHOLECYSTECTOMY  09/09/2011   Procedure: LAPAROSCOPIC CHOLECYSTECTOMY WITH INTRAOPERATIVE CHOLANGIOGRAM;  Surgeon: Jetty Duhamel, MD;  Location: [facility] OR;  Service: General;  Laterality: N/A;  . HERNIA REPAIR  04/2006   UHR  . JOINT REPLACEMENT    . KNEE ARTHROSCOPY Right    "w2 times" (05/19/2013)  . TONSILLECTOMY    . TOTAL KNEE ARTHROPLASTY Left 05/19/2013  . TOTAL KNEE ARTHROPLASTY Left 05/19/2013   Procedure: TOTAL KNEE ARTHROPLASTY;  Surgeon: Nadara Mustard, MD;  Location: [facility] OR;  Service: Orthopedics;  Laterality: Left;  Left Total Knee Arthroplasty  . TOTAL KNEE ARTHROPLASTY Right 11/24/2019  . TOTAL KNEE  ARTHROPLASTY Right 11/24/2019   Procedure: RIGHT TOTAL KNEE ARTHROPLASTY;  Surgeon: Nadara Mustard, MD;  Location: [facility] OR;  Service: Orthopedics;  Laterality: Right;  . TRANSTHORACIC ECHOCARDIOGRAM  03/28/2005   EF=>55%, mod conc LVH; LA mod dilated & borderline RA enlargement; mild TR; mild pulm valve regurg   Social History   Occupational History    Employer: [employer]  Tobacco Use  . Smoking status: Current Every Day Smoker    Packs/day: 0.50    Years: 50.00    Pack years: 25.00    Types: Cigarettes  . Smokeless tobacco: Never Used  Vaping Use  .  Vaping Use: Never used  Substance and Sexual Activity  . Alcohol use: No  . Drug use: No  . Sexual activity: Yes

## 2020-01-24 ENCOUNTER — Encounter: Payer: PRIVATE HEALTH INSURANCE | Admitting: Physical Therapy

## 2020-01-26 ENCOUNTER — Encounter: Payer: PRIVATE HEALTH INSURANCE | Admitting: Physical Therapy

## 2020-01-31 ENCOUNTER — Encounter: Payer: PRIVATE HEALTH INSURANCE | Admitting: Physical Therapy

## 2020-02-02 ENCOUNTER — Encounter: Payer: PRIVATE HEALTH INSURANCE | Admitting: Physical Therapy

## 2020-02-07 ENCOUNTER — Encounter: Payer: PRIVATE HEALTH INSURANCE | Admitting: Physical Therapy

## 2020-02-09 ENCOUNTER — Encounter: Payer: PRIVATE HEALTH INSURANCE | Admitting: Physical Therapy

## 2020-03-16 ENCOUNTER — Other Ambulatory Visit: Payer: Self-pay

## 2020-03-16 ENCOUNTER — Telehealth: Payer: Self-pay | Admitting: Orthopedic Surgery

## 2020-03-16 NOTE — Telephone Encounter (Signed)
Patient called.   He has a dental appointment at 11am today and wanted to know if antibiotics were needed.   Call back: [phone]

## 2020-03-16 NOTE — Telephone Encounter (Signed)
Pt is s/p a total joint 11/2019 I called to advise that Dr. Lajoyce Corners does not require abx coverage.

## 2020-03-16 NOTE — Telephone Encounter (Signed)
Sandy from [facility] called requesting pre medication orders for patient that had total knee replacement surgery 11/24/2018. Please fax over orders for pre meds at [phone]. Please call Sandy at [phone].

## 2020-03-16 NOTE — Telephone Encounter (Signed)
Called and sw sandy to advise no premed needed after a total knee per Dr. Lajoyce Corners protocol but if the dentist would still like antibiotic coverage Dr. Lajoyce Corners will use Keflex 500 mg 1 po prior to procedure and then I q 8 hrs x 3 after. Faxed per request to the number provided in message.

## 2020-05-22 ENCOUNTER — Ambulatory Visit (INDEPENDENT_AMBULATORY_CARE_PROVIDER_SITE_OTHER): Payer: Medicare Other | Admitting: Orthopedic Surgery

## 2020-05-22 ENCOUNTER — Encounter: Payer: Self-pay | Admitting: Orthopedic Surgery

## 2020-05-22 VITALS — Ht 70.0 in | Wt 261.0 lb

## 2020-05-22 DIAGNOSIS — Z96651 Presence of right artificial knee joint: Secondary | ICD-10-CM

## 2020-05-25 ENCOUNTER — Encounter: Payer: Self-pay | Admitting: Orthopedic Surgery

## 2020-05-25 NOTE — Progress Notes (Signed)
Office Visit Note   Patient: Mason Patton           Date of Birth: 1946/03/21           MRN: [id number] Visit Date: 05/22/2020              Requested by: Roger Kill, PA-C [address] [place],  [place] [address] PCP: Roger Kill, PA-C  Chief Complaint  Patient presents with  . Right Knee - Follow-up    11/24/19 right TKR       HPI: Patient is a 74 year old gentleman who presents about 6 months status post right total knee he feels like he is doing well wearing knee-high compression stockings.  Assessment & Plan: Visit Diagnoses:  1. Total knee replacement status, right     Plan: Continue to work on strengthening and range of motion continue with the compression.  Follow-Up Instructions: Return if symptoms worsen or fail to improve.   Ortho Exam  Patient is alert, oriented, no adenopathy, well-dressed, normal affect, normal respiratory effort. Patient is currently on 20 mg of Lasix he does have pitting edema in his leg with atrial fibrillation.  There are no ulcers he does have full extension of his knee collateral ligaments are stable no instability with tracking patella.  Imaging: No results found. No images are attached to the encounter.  Labs: Lab Results  Component Value Date   HGBA1C 6.9 (H) 11/22/2019   HGBA1C 7.7 07/24/2017   HGBA1C 8.3 02/03/2017   REPTSTATUS 03/27/2013 FINAL 03/23/2013   REPTSTATUS 03/23/2013 FINAL 03/23/2013   GRAMSTAIN  03/23/2013    WBC PRESENT, PREDOMINANTLY PMN NO ORGANISMS SEEN Performed at [facility] Performed at [facility]   GRAMSTAIN  03/23/2013    WBC PRESENT, PREDOMINANTLY MONONUCLEAR NO ORGANISMS SEEN CYTOSPIN SLIDE   CULT  03/23/2013    NO GROWTH 3 DAYS Performed at [facility]     Lab Results  Component Value Date   ALBUMIN 4.1 04/12/2016   ALBUMIN 3.8 11/07/2014   ALBUMIN 3.5 11/15/2013    No results found for: MG No results found for: VD25OH  No  results found for: PREALBUMIN CBC EXTENDED Latest Ref Rng & Units 11/22/2019 11/07/2014 11/15/2013  WBC 4.0 - 10.5 K/uL 9.0 9.9 9.6  RBC 4.22 - 5.81 MIL/uL 4.39 4.62 4.48  HGB 13.0 - 17.0 g/dL 12.9(L) 14.1 12.7(L)  HCT 39 - 52 % 40.3 40.9 38.4(L)  PLT 150 - 400 K/uL 205 196 201  NEUTROABS 1.7 - 7.7 K/uL - - 7.2  LYMPHSABS 0.7 - 4.0 K/uL - - 1.5     Body mass index is 37.45 kg/m.  Orders:  No orders of the defined types were placed in this encounter.  No orders of the defined types were placed in this encounter.    Procedures: No procedures performed  Clinical Data: No additional findings.  ROS:  All other systems negative, except as noted in the HPI. Review of Systems  Objective: Vital Signs: Ht 5\' 10"  (1.778 m)   Wt 261 lb (118.4 kg)   BMI 37.45 kg/m   Specialty Comments:  No specialty comments available.  PMFS History: Patient Active Problem List   Diagnosis Date Noted  . Arthritis of right knee 11/24/2019  . Unilateral primary osteoarthritis, right knee   . Uncontrolled type 2 diabetes mellitus with peripheral neuropathy (HCC) 03/27/2015  . 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 CVA (cerebral infarction) 11/16/2013  . Dizziness 11/15/2013  .  Left sided lacunar infarction (HCC) 11/15/2013  . Lacunar infarction (HCC) 06/01/2013  . Acute, but ill-defined, cerebrovascular disease 06/01/2013  . 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 Acute CVA (cerebrovascular accident) (HCC) 03/29/2013  . Numbness and tingling 03/27/2013  . Hyperglycemia 03/26/2013  . Meningitis 03/25/2013  . Encephalopathy acute 03/23/2013  . Fever 03/23/2013  . OSA on CPAP 03/23/2013  . Obesity 03/23/2013  . Murmur 03/16/2013  . Preoperative cardiovascular examination 03/16/2013  . Dyslipidemia 03/16/2013  . Hypertension   . Cholecystitis with cholelithiasis   . Apnea, sleep 01/23/2010   Past Medical History:  Diagnosis Date  . Aortic stenosis    09/30/19 echo ([facility],  Cardiologist Dr. Clovis Riley): Mild-moderate AS, MaxVel 308 cm/s, MeanPG 22 mmHg, AVA 1.6 cm2  . Arthritis   . Atrial fibrillation (HCC)   . Carotid artery stenosis    11/03/19 [place] ([facility]): 50-69% RICA stenosis  . Cholecystitis with cholelithiasis   . Eczema   . Emphysema of lung (HCC)   . Gallstones and inflammation of gallbladder without obstruction   . Heart murmur   . History of nuclear stress test 02/23/2010   dipyridamole; normal pattern of perfusion in all regions; normal, low risk study   . Hyperlipidemia   . Hypertension   . 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 Meningoencephalitis   . Obesity   . OSA on CPAP    last sleep study >8 yrs  . Pneumonia    "twice" (05/19/2013)  . Shortness of breath   . Stroke [facility])    "they said I had a minor one when I had menigitis" (05/19/2013)  . Thyroid disease   . Tobacco abuse    quit 11/08/2012  . Type II diabetes mellitus (HCC)   . Umbilical hernia     Family History  Problem Relation Age of Onset  . Cancer Maternal Aunt        colon  . Cancer Maternal Aunt        lung  . Thyroid disease Mother   . Hyperlipidemia Father   . Diabetes Maternal Grandmother   . Asthma Child   . Diabetes type I Child     Past Surgical History:  Procedure Laterality Date  . CHOLECYSTECTOMY  09/09/2011   Procedure: LAPAROSCOPIC CHOLECYSTECTOMY WITH INTRAOPERATIVE CHOLANGIOGRAM;  Surgeon: Jetty Duhamel, MD;  Location: [facility] OR;  Service: General;  Laterality: N/A;  . HERNIA REPAIR  04/2006   UHR  . JOINT REPLACEMENT    . KNEE ARTHROSCOPY Right    "w2 times" (05/19/2013)  . TONSILLECTOMY    . TOTAL KNEE ARTHROPLASTY Left 05/19/2013  . TOTAL KNEE ARTHROPLASTY Left 05/19/2013   Procedure: TOTAL KNEE ARTHROPLASTY;  Surgeon: Nadara Mustard, MD;  Location: [facility] OR;  Service: Orthopedics;  Laterality: Left;  Left Total Knee Arthroplasty  . TOTAL KNEE ARTHROPLASTY Right 11/24/2019  . TOTAL KNEE ARTHROPLASTY Right  11/24/2019   Procedure: RIGHT TOTAL KNEE ARTHROPLASTY;  Surgeon: Nadara Mustard, MD;  Location: [facility] OR;  Service: Orthopedics;  Laterality: Right;  . TRANSTHORACIC ECHOCARDIOGRAM  03/28/2005   EF=>55%, mod conc LVH; LA mod dilated & borderline RA enlargement; mild TR; mild pulm valve regurg   Social History   Occupational History    Employer: [employer]  Tobacco Use  . Smoking status: Current Every Day Smoker    Packs/day: 0.50    Years: 50.00    Pack years: 25.00    Types: Cigarettes  . Smokeless tobacco: Never Used  Vaping Use  . Vaping Use: Never used  Substance and Sexual Activity  . Alcohol use: No  . Drug use: No  . Sexual activity: Yes

## 2020-08-24 ENCOUNTER — Telehealth: Payer: Self-pay

## 2020-08-24 NOTE — Telephone Encounter (Signed)
[facility] [phone] EXT [phone], SENT REFERRAL TO SCHEDULING

## 2020-09-03 NOTE — [place] Notes (Addendum)
Cardiology Office Note:    Date:  09/05/2020   ID:  Mason Patton, DOB 06-01-1946, MRN [id number]  PCP:  Merwyn Katos   [place]  Cardiologist:  No primary care provider on file.  Advanced Practice Provider:  No care team member to display Electrophysiologist:  None    Referring MD: Heywood Bene, *    History of Present Illness:    Mason Patton is a 75 y.o. male with a hx of mild-to-moderate AS, atrial fibrillation, CAS, tobacco abuse, hypertension, obesity, obstructive sleep apnea, prior CVA, DMII and HLD who was referred by Dr. Jimmye Norman for further evaluation of chest pain, DOE, and positive stress test.  Patient states that he has chest pain and dyspnea for the past several months. Symptoms are worsened with exertion and take a while to resolve with rest. Occasionally has symptoms at rest. Had stress test at the [place] on 08/17/20 which showed multiple defects concerning for ischemia in the RCA and LAD territory prompting Cardiology referral. Also has Afib that was diagnosed about 1 year ago currently on apixaban. Has been anemic and is planned for colonoscopy for further work-up. Currently on iron supplementation  Patient has also been having worsening LE edema. Initially was on lasix 2m BID and lost 13lbs but then decreased to daily and now gaining fluid back. Has been feeling more SOB and continues to have LE edema. Sleeps with CPAP; no orthopnea. Non PND. TTE in our system in 2015 with LVEF 55-60%, severely dilated LA, mildly dilated RV.  Patient is notably chest pain free currently. ECG wit Afib with PVCs (bi/trigeminy).   Past Medical History:  Diagnosis Date  . Aortic stenosis    09/30/19 echo ([facility], Cardiologist Dr. LGeraldo Pitter: Mild-moderate AS, MaxVel 308 cm/s, MeanPG 22 mmHg, AVA 1.6 cm2  . Arthritis   . Atrial fibrillation ([place]   . Carotid artery stenosis    11/03/19 [place]([facility]): 50-69% RICA  stenosis  . Cholecystitis with cholelithiasis   . Eczema   . Emphysema of lung ([place]   . Gallstones and inflammation of gallbladder without obstruction   . Heart murmur   . History of nuclear stress test 02/23/2010   dipyridamole; normal pattern of perfusion in all regions; normal, low risk study   . Hyperlipidemia   . Hypertension   . 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 Meningoencephalitis   . Obesity   . OSA on CPAP    last sleep study >8 yrs  . Pneumonia    "twice" (05/19/2013)  . Shortness of breath   . Stroke ([facility]    "they said I had a minor one when I had menigitis" (05/19/2013)  . Thyroid disease   . Tobacco abuse    quit 11/08/2012  . Type II diabetes mellitus ([place]   . Umbilical hernia     Past Surgical History:  Procedure Laterality Date  . CHOLECYSTECTOMY  09/09/2011   Procedure: LAPAROSCOPIC CHOLECYSTECTOMY WITH INTRAOPERATIVE CHOLANGIOGRAM;  Surgeon: JBelva Crome MD;  [place]  Service: General;  Laterality: N/A;  . HERNIA REPAIR  04/2006   UHR  . JOINT REPLACEMENT    . KNEE ARTHROSCOPY Right    "w2 times" (05/19/2013)  . TONSILLECTOMY    . TOTAL KNEE ARTHROPLASTY Left 05/19/2013  . TOTAL KNEE ARTHROPLASTY Left 05/19/2013   Procedure: TOTAL KNEE ARTHROPLASTY;  Surgeon: MNewt Minion MD;  [place]  Service: Orthopedics;  Laterality: Left;  Left Total Knee Arthroplasty  . TOTAL KNEE ARTHROPLASTY Right 11/24/2019  . TOTAL KNEE ARTHROPLASTY Right 11/24/2019   Procedure: RIGHT TOTAL KNEE ARTHROPLASTY;  Surgeon: Newt Minion, MD;  Location: [place];  Service: Orthopedics;  Laterality: Right;  . TRANSTHORACIC ECHOCARDIOGRAM  03/28/2005   EF=>55%, mod conc LVH; LA mod dilated & borderline RA enlargement; mild TR; mild pulm valve regurg    Current Medications: Current Meds  Medication Sig  . apixaban (ELIQUIS) 5 MG TABS tablet Take 5 mg by mouth 2 (two) times daily.  [name] atorvastatin (LIPITOR) 40 MG tablet Take 40  mg by mouth at bedtime. Reported on 01/11/2016  . Blood Glucose Monitoring Suppl (ONETOUCH VERIO FLEX SYSTEM) W/DEVICE KIT 1 each by Does not apply route daily. Dx: E11.9  . carvedilol (COREG) 25 MG tablet Take 1 tablet by mouth 2 (two) times daily.  . cholecalciferol (VITAMIN D3) 25 MCG (1000 UNIT) tablet Take 1,000 Units by mouth daily.  . ferrous sulfate 325 (65 FE) MG tablet Take 1 tablet by mouth 3 (three) times a week.  [name] glipiZIDE (GLUCOTROL) 10 MG tablet Take 10 mg by mouth 2 (two) times daily before a meal.  . glucose blood (ONETOUCH VERIO) test strip Use to test blood sugar 2-3 times daily as instructed. Dx: E11.9  . isosorbide mononitrate (IMDUR) 30 MG 24 hr tablet Take 1 tablet (30 mg total) by mouth daily.  [name] levothyroxine (SYNTHROID) 112 MCG tablet Take 224 mcg by mouth daily before breakfast.  . losartan (COZAAR) 100 MG tablet Take 100 mg by mouth daily.  . metFORMIN (GLUCOPHAGE-XR) 500 MG 24 hr tablet TAKE 4 TABLETS BY MOUTH  DAILY WITH DINNER (Patient taking differently: TAKE 4 TABLETS BY MOUTH  DAILY WITH DINNER)  . Multiple Vitamins-Minerals (MULTIVITAMIN WITH MINERALS) tablet Take 1 tablet by mouth daily.  . Multiple Vitamins-Minerals (PRESERVISION AREDS 2+MULTI VIT PO) Take 1 capsule by mouth in the morning and at bedtime.  . nitroGLYCERIN (NITROSTAT) 0.4 MG SL tablet Place 1 tablet under the tongue as needed.  [name] omeprazole (PRILOSEC) 20 MG capsule Take 1 capsule by mouth daily.  [name] DELICA LANCETS FINE MISC Use to test blood sugar 2-3 times daily as instructed. Dx: E11.9  . oxyCODONE (OXY IR/ROXICODONE) 5 MG immediate release tablet Take 1 tablet (5 mg total) by mouth every 4 (four) hours as needed for moderate pain (pain score 4-6).  . VENTOLIN HFA 108 (90 Base) MCG/ACT inhaler Inhale 1-2 puffs into the lungs every 4 (four) hours as needed for wheezing or shortness of breath.   . vitamin B-12 (CYANOCOBALAMIN) 500 MCG tablet Take 1 tablet by mouth 2 (two) times daily.  .  [DISCONTINUED] furosemide (LASIX) 20 MG tablet Take 3 tablets by mouth as directed. 3 tablets daily until dry weight of 250lb, then decrease to 1 tablet daily     Allergies:   Codeine and Demerol   Social History   Socioeconomic History  . Marital status: Married    Spouse name: sandra  . Number of children: 3  . Years of education: tech  . Highest education level: Not on file  Occupational History    Employer: [employer]  Tobacco Use  . Smoking status: Current Every Day Smoker    Packs/day: 0.50    Years: 50.00    Pack years: 25.00    Types: Cigarettes  . Smokeless tobacco: Never Used  Vaping Use  . Vaping Use: Never used  Substance and Sexual Activity  .  Alcohol use: No  . Drug use: No  . Sexual activity: Yes  Other Topics Concern  . Not on file  Social History Narrative  . Not on file   Social Determinants of Health   Financial Resource Strain: Not on file  Food Insecurity: Not on file  Transportation Needs: Not on file  Physical Activity: Not on file  Stress: Not on file  Social Connections: Not on file     Family History: The patient's family history includes Asthma in his child; Cancer in his maternal aunt and maternal aunt; Diabetes in his maternal grandmother; Diabetes type I in his child; Hyperlipidemia in his father; Thyroid disease in his mother.  ROS:   Please see the history of present illness.    Review of Systems  Constitutional: Positive for malaise/fatigue. Negative for chills and fever.  HENT: Negative for hearing loss.   Eyes: Negative for blurred vision and redness.  Respiratory: Positive for shortness of breath.   Cardiovascular: Positive for chest pain and leg swelling. Negative for palpitations, orthopnea, claudication and PND.  Gastrointestinal: Negative for blood in stool, melena, nausea and vomiting.  Genitourinary: Negative for hematuria.  Musculoskeletal: Positive for joint pain and myalgias.  Neurological: Negative for dizziness and  loss of consciousness.  Endo/Heme/Allergies: Negative for polydipsia.  Psychiatric/Behavioral: Negative for substance abuse.    EKGs/Labs/Other Studies Reviewed:    The following studies were reviewed today: TTE 08-28-2013: Study Conclusions   - Left ventricle: The cavity size was normal. Wall thickness  was increased in a pattern of mild LVH. Systolic function  was normal. The estimated ejection fraction was in the  range of 55% to 60%. Wall motion was normal; there were no  regional wall motion abnormalities.  - Left atrium: The atrium was severely dilated.  - Right ventricle: The cavity size was mildly dilated. Wall  thickness was normal.  Impressions:   - No cardiac source of emboli was indentified. Compared to  the prior study, there has been no significant interval  change.  Transthoracic echocardiography. M-mode, complete 2D,  spectral Doppler, and color Doppler. Height: Height:  179.1cm. Height: 70.5in. Weight: Weight: 123.3kg. Weight:  271.3lb. Body mass index: BMI: 38.5kg/m^2. Body surface  area:  BSA: 2.75m2. Blood pressure:   147/66. Patient  status: Inpatient. Location: Echo laboratory.   ------------------------------------------------------------   ------------------------------------------------------------  Left ventricle: The cavity size was normal. Wall thickness  was increased in a pattern of mild LVH. Systolic function  was normal. The estimated ejection fraction was in the range  of 55% to 60%. Wall motion was normal; there were no  regional wall motion abnormalities.   ------------------------------------------------------------  Aortic valve:  Mildly thickened, mildly calcified leaflets.  Cusp separation was normal. Doppler: Transvalvular  velocity was minimally increased. There was no stenosis. No  regurgitation.  Peak velocity ratio of LVOT to aortic  valve: 0.5.  Mean gradient: 166mHg (S). Peak gradient:   1758mg (S).   ------------------------------------------------------------  Aorta: The aorta was normal, not dilated, and non-diseased.    ------------------------------------------------------------  Mitral valve:  Structurally normal valve.  Leaflet  separation was normal. Doppler: Transvalvular velocity was  within the normal range. There was no evidence for stenosis.  No regurgitation.  Peak gradient: 3mm37m (D).   ------------------------------------------------------------  Left atrium: The atrium was severely dilated.   ------------------------------------------------------------  Right ventricle: The cavity size was mildly dilated. Wall  thickness was normal. Systolic function was normal.   ------------------------------------------------------------  Pulmonic valve:  Structurally  normal valve.  Cusp  separation was normal. Doppler: Transvalvular velocity was  within the normal range. Trivial regurgitation.   ------------------------------------------------------------  Tricuspid valve:  Structurally normal valve.  Leaflet  separation was normal. Doppler: Transvalvular velocity was  within the normal range. No regurgitation.   ------------------------------------------------------------  Right atrium: The atrium was normal in size.   ------------------------------------------------------------  Pericardium: The pericardium was normal in appearance.  There was no pericardial effusion.   ------------------------------------------------------------  Systemic veins:  Inferior vena cava: The vessel was normal in size; the  respirophasic diameter changes were in the normal range (=  50%); findings are consistent with normal central venous  pressure.   ------------------------------------------------------------  Post procedure conclusions  Ascending Aorta:   - The aorta was normal, not dilated, and non-diseased.    Vascular Ultrasound  2014 Carotid Duplex (Doppler) has been completed.  Preliminary findings: Right = 40-59% ICA stenosis. Left = 1-39% ICA stenosis. Antegrade vertebral flow. Due to calcific plaque with acoustic shadowing, higher velocities may be obscured.  Myoview in 08/2020 per report: LAD or RCA ischemia  Carotids 10/2019: 50-69% RICA  EKG:  EKG is  ordered today.  The ekg ordered today demonstrates Afib with frequent PVCs (bigeminy and trigeminy)  Recent Labs: 11/22/2019: BUN 21; Creatinine, Ser 0.86; Hemoglobin 12.9; Platelets 205; Potassium 4.1; Sodium 141  Recent Lipid Panel    Component Value Date/Time   CHOL 106 04/12/2016 1423   TRIG 109.0 04/12/2016 1423   HDL 27.50 (L) 04/12/2016 1423   CHOLHDL 4 04/12/2016 1423   VLDL 21.8 04/12/2016 1423   LDLCALC 57 04/12/2016 1423     Risk Assessment/Calculations:    CHA2DS2-VASc Score = 7  This indicates a 11.2% annual risk of stroke. The patient's score is based upon: CHF History: Yes HTN History: Yes Diabetes History: Yes Stroke History: Yes Vascular Disease History: Yes Age Score: 1 Gender Score: 0   Physical Exam:    VS:  BP 138/70   Pulse 76   Ht 5' 10" (1.778 m)   Wt 260 lb 6.4 oz (118.1 kg)   SpO2 97%   BMI 37.36 kg/m     Wt Readings from Last 3 Encounters:  09/05/20 260 lb 6.4 oz (118.1 kg)  05/22/20 261 lb (118.4 kg)  12/09/19 261 lb 4 oz (118.5 kg)     GEN:  Well nourished, well developed in no acute distress HEENT: Normal NECK: No JVD; No carotid bruits CARDIAC: Irregularly irregular, 2/6 systolic murmur. No rubs, gallops RESPIRATORY:  Faint bibasilar crackles.  ABDOMEN: Soft, non-tender, non-distended MUSCULOSKELETAL:  Trace-1+ edema to mid-shin  SKIN: Warm and dry NEUROLOGIC:  Alert and oriented x 3 PSYCHIATRIC:  Normal affect   ASSESSMENT:    1. Coronary artery disease involving native coronary artery of native heart with angina pectoris ([place])   2. Medication management   3. Cerebrovascular accident  (CVA), unspecified mechanism ([place])   4. Primary hypertension   5. Longstanding persistent atrial fibrillation ([place])   6. Type 2 diabetes mellitus with other circulatory complication, with long-term current use of insulin ([place])   7. Iron deficiency anemia, unspecified iron deficiency anemia type   8. Mixed hyperlipidemia   9. Angina pectoris ([place])    PLAN:    In order of problems listed above:  #Positive Stress Test #Progressive Angina #DOE: Patient with progressive chest pain and dyspnea on exertion that has been ongoing for the past several months. Symptoms resolve with rest, but have been taking slightly longer to abate. Has  had a few episodes of chest pain at rest for which he took SL-NTG with improvement. Saw Cardiologist at [facility] and underwent stress testing which per report showed "defects concerning for RCA and LAD disease" prompting referral here. ECG here with Afib with frequent ectopy. Patient is currently chest pain free but high risk. Discussed option of going directly to the ER for evaluation vs checking troponin now and if normal, will plan for out-patient cath on Thursday. If trop elevated, patient amenable to going to ER at that time.  -Plan for RHC/LHC this Thursday -Check trop and if elevated, will send in tonight for further management -Discussed that if he has recurrent symptoms at rest that do not respond to SL-NTG or are occurring frequently, he needs to go immediately to the ER -Will start imdur 32m daily -Hold apixaban for plans for cath; take ASA 855mon day of cath -Continue lipitor 8038maily -Continue coreg 51m9mD -Continue losartan 100mg45mly -Check TTE  #Acute on Chronic Diastolic Heart Failure: Patient with last known EF 55-60%. Now overloaded with mild LE edema and faint crackles on exam. Has been increasing lasix per primary provider with improvement. Possible acute decompensation in the setting of ischemia as above with NYHA class III symptoms. Will  continue diuresis and manage suspected ischemic heart disease as detailed above. -Work-up suspected ischemic heart disease as above -Plan for RHC at time of LHC -Increase lasix to 60mg 8m-Check TTE -Continue coreg and losartan as above -Monitor daily weights and keep log -Low Na diet  #Afib:  CHADs-vasc 7. Currently in Afib. [facility]ixaban for [facility]; resume after -Continue coreg as above  #Mild-to-moderate AS: -Repeat TTE as above  #Iron deficiency anemia: No active melena or BRBPR. Planned for colonscopy on 03/07 but discussed that this will likely need to be delayed for cardiac work-up as above.  -Cardiac work-up as above -Continue iron supplementation -Follow-up with GI as scheduled  #Prior CVA: -Continue lipitor as below -Resume apixaban post-cath  #HTN: -Continue losartan 100mg d43m -Continue coreg 51mg BI67mHLD: -Continue lipitor 40mg dai67m#DMII: -On insulin -Continue metformin and glipizide  Shared Decision Making/Informed Consent The risks [stroke (1 in 1000), death (1 in 1000), kidney failure [usually temporary] (1 in 500), bleeding (1 in 200), allergic reaction [possibly serious] (1 in 200)], benefits (diagnostic support and management of coronary artery disease) and alternatives of a cardiac catheterization were discussed in detail with Mason Patton aReids willing to proceed.  Medication Adjustments/Labs and Tests Ordered: Current medicines are reviewed at length with the patient today.  Concerns regarding medicines are outlined above.  Orders Placed This Encounter  Procedures  . Troponin T  . Basic metabolic panel  . Pro b natriuretic peptide (BNP)  . Magnesium  . CBC  . EKG 12-Lead  . ECHOCARDIOGRAM COMPLETE   Meds ordered this encounter  Medications  . furosemide (LASIX) 20 MG tablet    Sig: Take 3 tablets (60 mg total) by mouth in the morning and at bedtime.    Dispense:  270 tablet    Refill:  3  . isosorbide  mononitrate (IMDUR) 30 MG 24 hr tablet    Sig: Take 1 tablet (30 mg total) by mouth daily.    Dispense:  90 tablet    Refill:  3    Patient Instructions  Medication Instructions:  1) Start Isosorbide (Imdur) 30 mg daily   2) Increase Lasix to 60 mg twice a  day   *If you need a refill on your cardiac medications before your next appointment, please call your pharmacy*   Lab Work: Troponin *stat*, Magnesium, Cbc, Bmp, Pro Bnp- Today   If you have labs (blood work) drawn today and your tests are completely normal, you will receive your results only by: [name] MyChart Message (if you have MyChart) OR . A paper copy in the mail If you have any lab test that is abnormal or we need to change your treatment, we will call you to review the results.   Testing/Procedures: Your physician has requested that you have a cardiac catheterization. Cardiac catheterization is used to diagnose and/or treat various heart conditions. Doctors may recommend this procedure for a number of different reasons. The most common reason is to evaluate chest pain. Chest pain can be a symptom of coronary artery disease (CAD), and cardiac catheterization can show whether plaque is narrowing or blocking your heart's arteries. This procedure is also used to evaluate the valves, as well as measure the blood flow and oxygen levels in different parts of your heart. For further information please visit [facility]uge[facility]iesta[facility]tn. Please follow instruction sheet, as given.  Your physician has requested that you have an echocardiogram. Echocardiography is a painless test that uses sound waves to create images of your heart. It provides your doctor with information about the size and shape of your heart and how well your heart's chambers and valves are working. This procedure takes approximately one hour. There are no restrictions for this procedure.     Follow-Up: Follow up with Dr. Johney Frame as planned     Other  [place] OFFICE [place], [place] [address] Dept: [phone] Loc: [place]  09/05/2020  You are scheduled for a Cardiac Catheterization on Thursday, March 3 with Dr. Quay Burow.  1. Please arrive at the [facility] (Main Entrance A) at [facility]: [address] [place], [place] [address] at 11:00 AM (This time is two hours before your procedure to ensure your preparation). Free valet parking service is available.   Special note: Every effort is made to have your procedure done on time. Please understand that emergencies sometimes delay scheduled procedures.  2. Diet: Do not eat solid foods after midnight.  The patient may have clear liquids until 5am upon the day of the procedure.  3. Medication instructions in preparation for your procedure:  Hold your second dose of Eliquis tonight and do not take any Eliquis tomorrow   Hold Lasix the morning of your test  Hold Glipizide the morning of your test   Do not take Diabetes Med Glucophage (Metformin) on the day of the procedure and HOLD 48 HOURS AFTER THE PROCEDURE.  On the morning of your procedure, take your Aspirin and any morning medicines NOT listed above.  You may use sips of water.  5. Plan for one night stay--bring personal belongings. 6. Bring a current list of your medications and current insurance cards. 7. You MUST have a responsible person to drive you home. 8. Someone MUST be with you the first 24 hours after you arrive home or your discharge will be delayed. 9. Please wear clothes that are easy to get on and off and wear slip-on shoes.  Thank you for allowing [place] to care for you!   -- [place] [facility]  Signed, Freada Bergeron, MD  09/05/2020 5:52 PM    [facility]

## 2020-09-05 ENCOUNTER — Telehealth: Payer: Self-pay | Admitting: Cardiology

## 2020-09-05 ENCOUNTER — Ambulatory Visit (INDEPENDENT_AMBULATORY_CARE_PROVIDER_SITE_OTHER): Payer: No Typology Code available for payment source | Admitting: Cardiology

## 2020-09-05 ENCOUNTER — Encounter: Payer: Self-pay | Admitting: Cardiology

## 2020-09-05 ENCOUNTER — Other Ambulatory Visit: Payer: Self-pay

## 2020-09-05 ENCOUNTER — Inpatient Hospital Stay (HOSPITAL_COMMUNITY)
Admission: EM | Admit: 2020-09-05 | Discharge: 2020-09-17 | DRG: 216 | Disposition: A | Discharge status: Home / Self Care | Payer: No Typology Code available for payment source | Attending: Surgery | Admitting: Surgery

## 2020-09-05 ENCOUNTER — Encounter (HOSPITAL_COMMUNITY): Payer: Self-pay | Admitting: Emergency Medicine

## 2020-09-05 VITALS — BP 138/70 | HR 76 | Ht 70.0 in | Wt 260.4 lb

## 2020-09-05 DIAGNOSIS — E785 Hyperlipidemia, unspecified: Secondary | ICD-10-CM | POA: Diagnosis present

## 2020-09-05 DIAGNOSIS — G4733 Obstructive sleep apnea (adult) (pediatric): Secondary | ICD-10-CM | POA: Diagnosis present

## 2020-09-05 DIAGNOSIS — D509 Iron deficiency anemia, unspecified: Secondary | ICD-10-CM

## 2020-09-05 DIAGNOSIS — Z9049 Acquired absence of other specified parts of digestive tract: Secondary | ICD-10-CM

## 2020-09-05 DIAGNOSIS — I214 Non-ST elevation (NSTEMI) myocardial infarction: Secondary | ICD-10-CM

## 2020-09-05 DIAGNOSIS — Z951 Presence of aortocoronary bypass graft: Secondary | ICD-10-CM

## 2020-09-05 DIAGNOSIS — Z7901 Long term (current) use of anticoagulants: Secondary | ICD-10-CM

## 2020-09-05 DIAGNOSIS — I350 Nonrheumatic aortic (valve) stenosis: Secondary | ICD-10-CM

## 2020-09-05 DIAGNOSIS — Z8349 Family history of other endocrine, nutritional and metabolic diseases: Secondary | ICD-10-CM

## 2020-09-05 DIAGNOSIS — E1159 Type 2 diabetes mellitus with other circulatory complications: Secondary | ICD-10-CM

## 2020-09-05 DIAGNOSIS — F17210 Nicotine dependence, cigarettes, uncomplicated: Secondary | ICD-10-CM | POA: Diagnosis present

## 2020-09-05 DIAGNOSIS — Z79899 Other long term (current) drug therapy: Secondary | ICD-10-CM | POA: Diagnosis not present

## 2020-09-05 DIAGNOSIS — Z952 Presence of prosthetic heart valve: Secondary | ICD-10-CM

## 2020-09-05 DIAGNOSIS — Z825 Family history of asthma and other chronic lower respiratory diseases: Secondary | ICD-10-CM

## 2020-09-05 DIAGNOSIS — E039 Hypothyroidism, unspecified: Secondary | ICD-10-CM | POA: Diagnosis present

## 2020-09-05 DIAGNOSIS — Z6837 Body mass index (BMI) 37.0-37.9, adult: Secondary | ICD-10-CM

## 2020-09-05 DIAGNOSIS — I10 Essential (primary) hypertension: Secondary | ICD-10-CM

## 2020-09-05 DIAGNOSIS — I4821 Permanent atrial fibrillation: Secondary | ICD-10-CM | POA: Diagnosis present

## 2020-09-05 DIAGNOSIS — E669 Obesity, unspecified: Secondary | ICD-10-CM | POA: Diagnosis present

## 2020-09-05 DIAGNOSIS — R079 Chest pain, unspecified: Secondary | ICD-10-CM | POA: Diagnosis not present

## 2020-09-05 DIAGNOSIS — Z8673 Personal history of transient ischemic attack (TIA), and cerebral infarction without residual deficits: Secondary | ICD-10-CM

## 2020-09-05 DIAGNOSIS — J939 Pneumothorax, unspecified: Secondary | ICD-10-CM

## 2020-09-05 DIAGNOSIS — M1990 Unspecified osteoarthritis, unspecified site: Secondary | ICD-10-CM | POA: Diagnosis present

## 2020-09-05 DIAGNOSIS — E876 Hypokalemia: Secondary | ICD-10-CM | POA: Diagnosis not present

## 2020-09-05 DIAGNOSIS — I200 Unstable angina: Secondary | ICD-10-CM | POA: Diagnosis present

## 2020-09-05 DIAGNOSIS — I639 Cerebral infarction, unspecified: Secondary | ICD-10-CM | POA: Diagnosis not present

## 2020-09-05 DIAGNOSIS — N179 Acute kidney failure, unspecified: Secondary | ICD-10-CM | POA: Diagnosis not present

## 2020-09-05 DIAGNOSIS — Z79890 Hormone replacement therapy (postmenopausal): Secondary | ICD-10-CM

## 2020-09-05 DIAGNOSIS — Z96653 Presence of artificial knee joint, bilateral: Secondary | ICD-10-CM | POA: Diagnosis present

## 2020-09-05 DIAGNOSIS — J90 Pleural effusion, not elsewhere classified: Secondary | ICD-10-CM

## 2020-09-05 DIAGNOSIS — I5033 Acute on chronic diastolic (congestive) heart failure: Secondary | ICD-10-CM | POA: Diagnosis present

## 2020-09-05 DIAGNOSIS — Z8661 Personal history of infections of the central nervous system: Secondary | ICD-10-CM

## 2020-09-05 DIAGNOSIS — D62 Acute posthemorrhagic anemia: Secondary | ICD-10-CM | POA: Diagnosis not present

## 2020-09-05 DIAGNOSIS — I25110 Atherosclerotic heart disease of native coronary artery with unstable angina pectoris: Principal | ICD-10-CM | POA: Diagnosis present

## 2020-09-05 DIAGNOSIS — I2510 Atherosclerotic heart disease of native coronary artery without angina pectoris: Secondary | ICD-10-CM | POA: Diagnosis present

## 2020-09-05 DIAGNOSIS — I4811 Longstanding persistent atrial fibrillation: Secondary | ICD-10-CM

## 2020-09-05 DIAGNOSIS — I25119 Atherosclerotic heart disease of native coronary artery with unspecified angina pectoris: Secondary | ICD-10-CM | POA: Diagnosis not present

## 2020-09-05 DIAGNOSIS — E782 Mixed hyperlipidemia: Secondary | ICD-10-CM

## 2020-09-05 DIAGNOSIS — Z83438 Family history of other disorder of lipoprotein metabolism and other lipidemia: Secondary | ICD-10-CM

## 2020-09-05 DIAGNOSIS — Z7984 Long term (current) use of oral hypoglycemic drugs: Secondary | ICD-10-CM

## 2020-09-05 DIAGNOSIS — I4892 Unspecified atrial flutter: Secondary | ICD-10-CM | POA: Diagnosis present

## 2020-09-05 DIAGNOSIS — I209 Angina pectoris, unspecified: Secondary | ICD-10-CM

## 2020-09-05 DIAGNOSIS — Z885 Allergy status to narcotic agent status: Secondary | ICD-10-CM

## 2020-09-05 DIAGNOSIS — I6521 Occlusion and stenosis of right carotid artery: Secondary | ICD-10-CM | POA: Diagnosis present

## 2020-09-05 DIAGNOSIS — I959 Hypotension, unspecified: Secondary | ICD-10-CM | POA: Diagnosis not present

## 2020-09-05 DIAGNOSIS — I440 Atrioventricular block, first degree: Secondary | ICD-10-CM | POA: Diagnosis present

## 2020-09-05 DIAGNOSIS — E119 Type 2 diabetes mellitus without complications: Secondary | ICD-10-CM | POA: Diagnosis present

## 2020-09-05 DIAGNOSIS — Z01818 Encounter for other preprocedural examination: Secondary | ICD-10-CM

## 2020-09-05 DIAGNOSIS — I110 Hypertensive heart disease with heart failure: Secondary | ICD-10-CM | POA: Diagnosis present

## 2020-09-05 DIAGNOSIS — J439 Emphysema, unspecified: Secondary | ICD-10-CM | POA: Diagnosis present

## 2020-09-05 DIAGNOSIS — Z8701 Personal history of pneumonia (recurrent): Secondary | ICD-10-CM

## 2020-09-05 DIAGNOSIS — I429 Cardiomyopathy, unspecified: Secondary | ICD-10-CM | POA: Diagnosis present

## 2020-09-05 DIAGNOSIS — Z794 Long term (current) use of insulin: Secondary | ICD-10-CM

## 2020-09-05 DIAGNOSIS — Z20822 Contact with and (suspected) exposure to covid-19: Secondary | ICD-10-CM | POA: Diagnosis present

## 2020-09-05 DIAGNOSIS — Z833 Family history of diabetes mellitus: Secondary | ICD-10-CM

## 2020-09-05 DIAGNOSIS — I080 Rheumatic disorders of both mitral and aortic valves: Secondary | ICD-10-CM | POA: Diagnosis present

## 2020-09-05 LAB — CBC WITH DIFFERENTIAL/PLATELET
Abs Immature Granulocytes: 0.03 10*3/uL (ref 0.00–0.07)
Basophils Absolute: 0 10*3/uL (ref 0.0–0.1)
Basophils Relative: 0 %
Eosinophils Absolute: 0.2 10*3/uL (ref 0.0–0.5)
Eosinophils Relative: 3 %
HCT: 32.4 % — ABNORMAL LOW (ref 39.0–52.0)
Hemoglobin: 9.2 g/dL — ABNORMAL LOW (ref 13.0–17.0)
Immature Granulocytes: 0 %
Lymphocytes Relative: 20 %
Lymphs Abs: 1.5 10*3/uL (ref 0.7–4.0)
MCH: 21.6 pg — ABNORMAL LOW (ref 26.0–34.0)
MCHC: 28.4 g/dL — ABNORMAL LOW (ref 30.0–36.0)
MCV: 76.1 fL — ABNORMAL LOW (ref 80.0–100.0)
Monocytes Absolute: 0.5 10*3/uL (ref 0.1–1.0)
Monocytes Relative: 6 %
Neutro Abs: 5.1 10*3/uL (ref 1.7–7.7)
Neutrophils Relative %: 71 %
Platelets: 191 10*3/uL (ref 150–400)
RBC: 4.26 MIL/uL (ref 4.22–5.81)
RDW: 17.6 % — ABNORMAL HIGH (ref 11.5–15.5)
WBC: 7.3 10*3/uL (ref 4.0–10.5)
nRBC: 0 % (ref 0.0–0.2)

## 2020-09-05 LAB — COMPREHENSIVE METABOLIC PANEL
ALT: 21 U/L (ref 0–44)
AST: 23 U/L (ref 15–41)
Albumin: 3.5 g/dL (ref 3.5–5.0)
Alkaline Phosphatase: 86 U/L (ref 38–126)
Anion gap: 10 (ref 5–15)
BUN: 21 mg/dL (ref 8–23)
CO2: 28 mmol/L (ref 22–32)
Calcium: 9.2 mg/dL (ref 8.9–10.3)
Chloride: 103 mmol/L (ref 98–111)
Creatinine, Ser: 0.94 mg/dL (ref 0.61–1.24)
GFR, Estimated: >60 mL/min (ref >60)
Glucose, Bld: 240 mg/dL — ABNORMAL HIGH (ref 70–99)
Potassium: 3.5 mmol/L (ref 3.5–5.1)
Sodium: 141 mmol/L (ref 135–145)
Total Bilirubin: 1.1 mg/dL (ref 0.3–1.2)
Total Protein: 6.1 g/dL — ABNORMAL LOW (ref 6.5–8.1)

## 2020-09-05 LAB — PROTIME-INR
INR: 1.4 — ABNORMAL HIGH (ref 0.8–1.2)
Prothrombin Time: 16.7 seconds — ABNORMAL HIGH (ref 11.4–15.2)

## 2020-09-05 LAB — CBC
Hematocrit: 32.6 % — ABNORMAL LOW (ref 37.5–51.0)
Hemoglobin: 9.8 g/dL — ABNORMAL LOW (ref 13.0–17.7)
MCH: 22 pg — ABNORMAL LOW (ref 26.6–33.0)
MCHC: 30.1 g/dL — ABNORMAL LOW (ref 31.5–35.7)
MCV: 73 fL — ABNORMAL LOW (ref 79–97)
Platelets: 198 10*3/uL (ref 150–450)
RBC: 4.45 x10E6/uL (ref 4.14–5.80)
RDW: 16.6 % — ABNORMAL HIGH (ref 11.6–15.4)
WBC: 7.9 10*3/uL (ref 3.4–10.8)

## 2020-09-05 LAB — TROPONIN T: Troponin T (Highly Sensitive): 28 ng/L (ref 0–22)

## 2020-09-05 LAB — TROPONIN I (HIGH SENSITIVITY): Troponin I (High Sensitivity): 17 ng/L (ref <18)

## 2020-09-05 LAB — APTT: aPTT: 39 seconds — ABNORMAL HIGH (ref 24–36)

## 2020-09-05 MED ORDER — FUROSEMIDE 20 MG PO TABS: 60.0000 mg | ORAL_TABLET | Freq: Two times a day (BID) | ORAL | 3 refills | Status: DC

## 2020-09-05 MED ORDER — ISOSORBIDE MONONITRATE ER 30 MG PO TB24
30.0000 mg | ORAL_TABLET | Freq: Every day | ORAL | 3 refills | Status: DC
Start: 2020-09-05 — End: 2020-09-17

## 2020-09-05 NOTE — Telephone Encounter (Signed)
Troponin elevated at 28. Given progressive symptoms, episodes of chest pain at rest, Afib with frequent PVCs, and positive stress test, recommended patient go to the ER now. Patient and daughter understands plan and amenable to go to [facility] hospital.  Laurance Flatten, MD

## 2020-09-05 NOTE — Patient Instructions (Signed)
Medication Instructions:  1) Start Isosorbide (Imdur) 30 mg daily   2) Increase Lasix to 60 mg twice a day   *If you need a refill on your cardiac medications before your next appointment, please call your pharmacy*   Lab Work: Troponin *stat*, Magnesium, Cbc, Bmp, Pro Bnp- Today   If you have labs (blood work) drawn today and your tests are completely normal, you will receive your results only by: [name] MyChart Message (if you have MyChart) OR . A paper copy in the mail If you have any lab test that is abnormal or we need to change your treatment, we will call you to review the results.   Testing/Procedures: Your physician has requested that you have a cardiac catheterization. Cardiac catheterization is used to diagnose and/or treat various heart conditions. Doctors may recommend this procedure for a number of different reasons. The most common reason is to evaluate chest pain. Chest pain can be a symptom of coronary artery disease (CAD), and cardiac catheterization can show whether plaque is narrowing or blocking your heart's arteries. This procedure is also used to evaluate the valves, as well as measure the blood flow and oxygen levels in different parts of your heart. For further information please visit [email or web address] Please follow instruction sheet, as given.  Your physician has requested that you have an echocardiogram. Echocardiography is a painless test that uses sound waves to create images of your heart. It provides your doctor with information about the size and shape of your heart and how well your heart's chambers and valves are working. This procedure takes approximately one hour. There are no restrictions for this procedure.     Follow-Up: Follow up with Dr. Shari Prows as planned     Other Instructions    [facility] CARDIOVASCULAR DIVISION [facility] ST OFFICE [address] [place] [address] Dept:  [phone] Loc: [phone]  Mason Patton  09/05/2020  You are scheduled for a Cardiac Catheterization on Thursday, March 3 with Dr. Nanetta Batty.  1. Please arrive at the [facility] (Main Entrance A) at [facility]: [address] [place], [place] [address] at 11:00 AM (This time is two hours before your procedure to ensure your preparation). Free valet parking service is available.   Special note: Every effort is made to have your procedure done on time. Please understand that emergencies sometimes delay scheduled procedures.  2. Diet: Do not eat solid foods after midnight.  The patient may have clear liquids until 5am upon the day of the procedure.  3. Medication instructions in preparation for your procedure:  Hold your second dose of Eliquis tonight and do not take any Eliquis tomorrow   Hold Lasix the morning of your test  Hold Glipizide the morning of your test   Do not take Diabetes Med Glucophage (Metformin) on the day of the procedure and HOLD 48 HOURS AFTER THE PROCEDURE.  On the morning of your procedure, take your Aspirin and any morning medicines NOT listed above.  You may use sips of water.  5. Plan for one night stay--bring personal belongings. 6. Bring a current list of your medications and current insurance cards. 7. You MUST have a responsible person to drive you home. 8. Someone MUST be with you the first 24 hours after you arrive home or your discharge will be delayed. 9. Please wear clothes that are easy to get on and off and wear slip-on shoes.  Thank you  for allowing [place] to care for you!   -- [facility]

## 2020-09-05 NOTE — ED Triage Notes (Signed)
Pt reports intermitted chest pain/SOB when at rest and during excretion. Pt states cardiologist told pt to come in today due to elevated troponin. Pt states had a positive stress test about 3 weeks ago and "blockage". Pt states was suppose to have an cath on Thursday.

## 2020-09-06 ENCOUNTER — Emergency Department (HOSPITAL_COMMUNITY): Payer: No Typology Code available for payment source

## 2020-09-06 ENCOUNTER — Other Ambulatory Visit (HOSPITAL_COMMUNITY): Payer: No Typology Code available for payment source

## 2020-09-06 ENCOUNTER — Inpatient Hospital Stay (HOSPITAL_COMMUNITY): Payer: No Typology Code available for payment source

## 2020-09-06 DIAGNOSIS — G4733 Obstructive sleep apnea (adult) (pediatric): Secondary | ICD-10-CM | POA: Diagnosis present

## 2020-09-06 DIAGNOSIS — I110 Hypertensive heart disease with heart failure: Secondary | ICD-10-CM | POA: Diagnosis present

## 2020-09-06 DIAGNOSIS — I428 Other cardiomyopathies: Secondary | ICD-10-CM

## 2020-09-06 DIAGNOSIS — N179 Acute kidney failure, unspecified: Secondary | ICD-10-CM | POA: Diagnosis not present

## 2020-09-06 DIAGNOSIS — I5033 Acute on chronic diastolic (congestive) heart failure: Secondary | ICD-10-CM | POA: Diagnosis present

## 2020-09-06 DIAGNOSIS — M1990 Unspecified osteoarthritis, unspecified site: Secondary | ICD-10-CM | POA: Diagnosis present

## 2020-09-06 DIAGNOSIS — Z8673 Personal history of transient ischemic attack (TIA), and cerebral infarction without residual deficits: Secondary | ICD-10-CM | POA: Diagnosis not present

## 2020-09-06 DIAGNOSIS — J439 Emphysema, unspecified: Secondary | ICD-10-CM | POA: Diagnosis present

## 2020-09-06 DIAGNOSIS — D509 Iron deficiency anemia, unspecified: Secondary | ICD-10-CM | POA: Diagnosis present

## 2020-09-06 DIAGNOSIS — I200 Unstable angina: Secondary | ICD-10-CM | POA: Diagnosis not present

## 2020-09-06 DIAGNOSIS — Z01810 Encounter for preprocedural cardiovascular examination: Secondary | ICD-10-CM | POA: Diagnosis not present

## 2020-09-06 DIAGNOSIS — I440 Atrioventricular block, first degree: Secondary | ICD-10-CM | POA: Diagnosis present

## 2020-09-06 DIAGNOSIS — I509 Heart failure, unspecified: Secondary | ICD-10-CM | POA: Diagnosis not present

## 2020-09-06 DIAGNOSIS — R079 Chest pain, unspecified: Secondary | ICD-10-CM | POA: Diagnosis present

## 2020-09-06 DIAGNOSIS — I4891 Unspecified atrial fibrillation: Secondary | ICD-10-CM | POA: Diagnosis not present

## 2020-09-06 DIAGNOSIS — I4892 Unspecified atrial flutter: Secondary | ICD-10-CM | POA: Diagnosis present

## 2020-09-06 DIAGNOSIS — Z8701 Personal history of pneumonia (recurrent): Secondary | ICD-10-CM | POA: Diagnosis not present

## 2020-09-06 DIAGNOSIS — Z20822 Contact with and (suspected) exposure to covid-19: Secondary | ICD-10-CM | POA: Diagnosis present

## 2020-09-06 DIAGNOSIS — I2510 Atherosclerotic heart disease of native coronary artery without angina pectoris: Secondary | ICD-10-CM | POA: Diagnosis not present

## 2020-09-06 DIAGNOSIS — I4821 Permanent atrial fibrillation: Secondary | ICD-10-CM | POA: Diagnosis present

## 2020-09-06 DIAGNOSIS — I080 Rheumatic disorders of both mitral and aortic valves: Secondary | ICD-10-CM | POA: Diagnosis present

## 2020-09-06 DIAGNOSIS — D62 Acute posthemorrhagic anemia: Secondary | ICD-10-CM | POA: Diagnosis not present

## 2020-09-06 DIAGNOSIS — I959 Hypotension, unspecified: Secondary | ICD-10-CM | POA: Diagnosis not present

## 2020-09-06 DIAGNOSIS — E876 Hypokalemia: Secondary | ICD-10-CM | POA: Diagnosis not present

## 2020-09-06 DIAGNOSIS — E039 Hypothyroidism, unspecified: Secondary | ICD-10-CM | POA: Diagnosis present

## 2020-09-06 DIAGNOSIS — I429 Cardiomyopathy, unspecified: Secondary | ICD-10-CM | POA: Diagnosis present

## 2020-09-06 DIAGNOSIS — E785 Hyperlipidemia, unspecified: Secondary | ICD-10-CM | POA: Diagnosis present

## 2020-09-06 DIAGNOSIS — F17210 Nicotine dependence, cigarettes, uncomplicated: Secondary | ICD-10-CM | POA: Diagnosis present

## 2020-09-06 DIAGNOSIS — I350 Nonrheumatic aortic (valve) stenosis: Secondary | ICD-10-CM | POA: Diagnosis not present

## 2020-09-06 DIAGNOSIS — I6521 Occlusion and stenosis of right carotid artery: Secondary | ICD-10-CM | POA: Diagnosis present

## 2020-09-06 DIAGNOSIS — E119 Type 2 diabetes mellitus without complications: Secondary | ICD-10-CM | POA: Diagnosis present

## 2020-09-06 DIAGNOSIS — I25110 Atherosclerotic heart disease of native coronary artery with unstable angina pectoris: Secondary | ICD-10-CM | POA: Diagnosis present

## 2020-09-06 LAB — BASIC METABOLIC PANEL
BUN/Creatinine Ratio: 23 (ref 10–24)
BUN: 21 mg/dL (ref 8–27)
CO2: 24 mmol/L (ref 20–29)
Calcium: 9.4 mg/dL (ref 8.6–10.2)
Chloride: 103 mmol/L (ref 96–106)
Creatinine, Ser: 0.9 mg/dL (ref 0.76–1.27)
Glucose: 212 mg/dL — ABNORMAL HIGH (ref 65–99)
Potassium: 4.4 mmol/L (ref 3.5–5.2)
Sodium: 143 mmol/L (ref 134–144)
eGFR: 90 mL/min/{1.73_m2} (ref >59)

## 2020-09-06 LAB — ECHOCARDIOGRAM COMPLETE
AR max vel: 0.72 cm2
AV Area VTI: 0.67 cm2
AV Area mean vel: 0.68 cm2
AV Mean grad: 35 mmHg
AV Peak grad: 53.4 mmHg
Ao pk vel: 3.66 m/s
Area-P 1/2: 2.24 cm2
Height: 70 in
S' Lateral: 4.2 cm
Weight: 4080 oz

## 2020-09-06 LAB — APTT: aPTT: 53 seconds — ABNORMAL HIGH (ref 24–36)

## 2020-09-06 LAB — HEPARIN LEVEL (UNFRACTIONATED): Heparin Unfractionated: 1.96 IU/mL — ABNORMAL HIGH (ref 0.30–0.70)

## 2020-09-06 LAB — CBG MONITORING, ED
Glucose-Capillary: 109 mg/dL — ABNORMAL HIGH (ref 70–99)
Glucose-Capillary: 145 mg/dL — ABNORMAL HIGH (ref 70–99)
Glucose-Capillary: 149 mg/dL — ABNORMAL HIGH (ref 70–99)
Glucose-Capillary: 114 mg/dL — ABNORMAL HIGH (ref 70–99)

## 2020-09-06 LAB — HEMOGLOBIN A1C
Hgb A1c MFr Bld: 7.4 % — ABNORMAL HIGH (ref 4.8–5.6)
Mean Plasma Glucose: 165.68 mg/dL

## 2020-09-06 LAB — MAGNESIUM
Magnesium: 1.7 mg/dL (ref 1.6–2.3)
Magnesium: 1.6 mg/dL — ABNORMAL LOW (ref 1.7–2.4)

## 2020-09-06 LAB — BRAIN NATRIURETIC PEPTIDE: B Natriuretic Peptide: 803 pg/mL — ABNORMAL HIGH (ref 0.0–100.0)

## 2020-09-06 LAB — SARS CORONAVIRUS 2 (TAT 6-24 HRS): SARS Coronavirus 2: NEGATIVE

## 2020-09-06 LAB — TROPONIN I (HIGH SENSITIVITY): Troponin I (High Sensitivity): 20 ng/L — ABNORMAL HIGH (ref <18)

## 2020-09-06 LAB — PRO B NATRIURETIC PEPTIDE: NT-Pro BNP: 3817 pg/mL — ABNORMAL HIGH (ref 0–376)

## 2020-09-06 LAB — TSH: TSH: 1.317 u[IU]/mL (ref 0.350–4.500)

## 2020-09-06 MED ORDER — ASPIRIN 81 MG PO CHEW
324.0000 mg | CHEWABLE_TABLET | Freq: Once | ORAL | Status: AC
  Administered 2020-09-06: 324 mg via ORAL
  Filled 2020-09-06: qty 4

## 2020-09-06 MED ORDER — ACETAMINOPHEN 325 MG PO TABS: 650.0000 mg | ORAL_TABLET | ORAL | Status: DC | PRN

## 2020-09-06 MED ORDER — LEVOTHYROXINE SODIUM 112 MCG PO TABS
224.0000 ug | ORAL_TABLET | Freq: Every day | ORAL | Status: DC
  Administered 2020-09-06 – 2020-09-17 (×10): 224 ug via ORAL
  Filled 2020-09-06 (×12): qty 2

## 2020-09-06 MED ORDER — ATORVASTATIN CALCIUM 40 MG PO TABS
40.0000 mg | ORAL_TABLET | Freq: Every day | ORAL | Status: DC
  Administered 2020-09-06 – 2020-09-16 (×10): 40 mg via ORAL
  Filled 2020-09-06 (×10): qty 1

## 2020-09-06 MED ORDER — INSULIN ASPART 100 UNIT/ML ~~LOC~~ SOLN
0.0000 [IU] | Freq: Every day | SUBCUTANEOUS | Status: DC
  Administered 2020-09-07: 3 [IU] via SUBCUTANEOUS
  Administered 2020-09-10: 2 [IU] via SUBCUTANEOUS

## 2020-09-06 MED ORDER — ASPIRIN EC 81 MG PO TBEC
81.0000 mg | DELAYED_RELEASE_TABLET | Freq: Every day | ORAL | Status: DC
  Administered 2020-09-08 – 2020-09-10 (×3): 81 mg via ORAL
  Filled 2020-09-06 (×3): qty 1

## 2020-09-06 MED ORDER — LOSARTAN POTASSIUM 50 MG PO TABS
100.0000 mg | ORAL_TABLET | Freq: Every day | ORAL | Status: DC
  Administered 2020-09-06 – 2020-09-10 (×5): 100 mg via ORAL
  Filled 2020-09-06 (×5): qty 2

## 2020-09-06 MED ORDER — CARVEDILOL 25 MG PO TABS
25.0000 mg | ORAL_TABLET | Freq: Two times a day (BID) | ORAL | Status: DC
  Administered 2020-09-06 – 2020-09-08 (×5): 25 mg via ORAL
  Filled 2020-09-06: qty 2
  Filled 2020-09-06: qty 1
  Filled 2020-09-06: qty 8
  Filled 2020-09-06: qty 2
  Filled 2020-09-06: qty 1

## 2020-09-06 MED ORDER — INSULIN ASPART 100 UNIT/ML ~~LOC~~ SOLN
6.0000 [IU] | Freq: Three times a day (TID) | SUBCUTANEOUS | Status: DC
  Administered 2020-09-06 – 2020-09-10 (×8): 6 [IU] via SUBCUTANEOUS

## 2020-09-06 MED ORDER — SODIUM CHLORIDE 0.9% FLUSH
3.0000 mL | Freq: Two times a day (BID) | INTRAVENOUS | Status: DC
  Administered 2020-09-06 – 2020-09-17 (×19): 3 mL via INTRAVENOUS

## 2020-09-06 MED ORDER — INSULIN ASPART 100 UNIT/ML ~~LOC~~ SOLN
0.0000 [IU] | Freq: Three times a day (TID) | SUBCUTANEOUS | Status: DC
  Administered 2020-09-06: 3 [IU] via SUBCUTANEOUS
  Administered 2020-09-08: 7 [IU] via SUBCUTANEOUS
  Administered 2020-09-09: 3 [IU] via SUBCUTANEOUS
  Administered 2020-09-09: 7 [IU] via SUBCUTANEOUS
  Administered 2020-09-09 – 2020-09-10 (×2): 4 [IU] via SUBCUTANEOUS
  Administered 2020-09-10: 3 [IU] via SUBCUTANEOUS
  Administered 2020-09-10: 4 [IU] via SUBCUTANEOUS

## 2020-09-06 MED ORDER — SODIUM CHLORIDE 0.9 % IV SOLN: 250.0000 mL | INTRAVENOUS | Status: DC | PRN

## 2020-09-06 MED ORDER — APIXABAN 5 MG PO TABS
5.0000 mg | ORAL_TABLET | Freq: Two times a day (BID) | ORAL | Status: DC
  Administered 2020-09-06: 5 mg via ORAL
  Filled 2020-09-06: qty 1

## 2020-09-06 MED ORDER — ONDANSETRON HCL 4 MG/2ML IJ SOLN: 4.0000 mg | Freq: Four times a day (QID) | INTRAMUSCULAR | Status: DC | PRN

## 2020-09-06 MED ORDER — SODIUM CHLORIDE 0.9% FLUSH: 3.0000 mL | INTRAVENOUS | Status: DC | PRN

## 2020-09-06 MED ORDER — HEPARIN (PORCINE) 25000 UT/250ML-% IV SOLN
1600.0000 [IU]/h | INTRAVENOUS | Status: DC
  Administered 2020-09-06: 1400 [IU]/h via INTRAVENOUS
  Administered 2020-09-07: 1600 [IU]/h via INTRAVENOUS
  Filled 2020-09-06 (×2): qty 250

## 2020-09-06 MED ORDER — FUROSEMIDE 10 MG/ML IJ SOLN
40.0000 mg | Freq: Three times a day (TID) | INTRAMUSCULAR | Status: AC
  Administered 2020-09-06 (×2): 40 mg via INTRAVENOUS
  Filled 2020-09-06 (×2): qty 4

## 2020-09-06 NOTE — H&P (Signed)
Cardiology History & Physical    Patient ID: Mason Patton MRN: [id number], DOB/AGE: 10/30/1945   Admit date: 09/05/2020  Primary Physician: Heywood Bene, PA-C Primary Cardiologist: No primary care provider on file.  Patient Profile    75 year old male with mild/mod AS, atrial fibrillation, CAD, tobacco use, HTN, obesity, OSA, prior CVA, T2DM, morbid obesity, hyperlipidemia who presents as direct admission for evaluation of chest pain and shortness of breath.  History of Present Illness    He reports that since January he has been having exertional cehst pain and shortness of breath for the last couple of months.  The amount of exertion that provokes symptoms is variable but it has gotten worse over the last couple of weeks.  He will have shortness of breath with ambulation short distances and occasional chest pain with this.  He has some orthopnea but his CPAP helps with that.  Additionally he has noticed worsening lower extremity edema.  It had responded to twice daily furosemide dosing previously but now he is back to severe peripheral edema with a reduction in his lasix dowing to once daily.  Remains in AF.  Troponin 17, 20  Past Medical History   Past Medical History:  Diagnosis Date  . Aortic stenosis    09/30/19 echo ([facility], Cardiologist Dr. Geraldo Pitter): Mild-moderate AS, MaxVel 308 cm/s, MeanPG 22 mmHg, AVA 1.6 cm2  . Arthritis   . Atrial fibrillation ([place])   . Carotid artery stenosis    11/03/19 [place] ([facility]): 50-69% RICA stenosis  . Cholecystitis with cholelithiasis   . Eczema   . Emphysema of lung ([place])   . Gallstones and inflammation of gallbladder without obstruction   . Heart murmur   . History of nuclear stress test 02/23/2010   dipyridamole; normal pattern of perfusion in all regions; normal, low risk study   . Hyperlipidemia   . Hypertension   . 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 Meningoencephalitis    . Obesity   . OSA on CPAP    last sleep study >8 yrs  . Pneumonia    "twice" (05/19/2013)  . Shortness of breath   . Stroke [facility])    "they said I had a minor one when I had menigitis" (05/19/2013)  . Thyroid disease   . Tobacco abuse    quit 11/08/2012  . Type II diabetes mellitus ([place])   . Umbilical hernia     Past Surgical History:  Procedure Laterality Date  . CHOLECYSTECTOMY  09/09/2011   Procedure: LAPAROSCOPIC CHOLECYSTECTOMY WITH INTRAOPERATIVE CHOLANGIOGRAM;  Surgeon: Belva Crome, MD;  Location: [place];  Service: General;  Laterality: N/A;  . HERNIA REPAIR  04/2006   UHR  . JOINT REPLACEMENT    . KNEE ARTHROSCOPY Right    "w2 times" (05/19/2013)  . TONSILLECTOMY    . TOTAL KNEE ARTHROPLASTY Left 05/19/2013  . TOTAL KNEE ARTHROPLASTY Left 05/19/2013   Procedure: TOTAL KNEE ARTHROPLASTY;  Surgeon: Newt Minion, MD;  Location: [place];  Service: Orthopedics;  Laterality: Left;  Left Total Knee Arthroplasty  . TOTAL KNEE ARTHROPLASTY Right 11/24/2019  . TOTAL KNEE ARTHROPLASTY Right 11/24/2019   Procedure: RIGHT TOTAL KNEE ARTHROPLASTY;  Surgeon: Newt Minion, MD;  Location: [place];  Service: Orthopedics;  Laterality: Right;  . TRANSTHORACIC ECHOCARDIOGRAM  03/28/2005   EF=>55%, mod conc LVH; LA mod dilated & borderline RA enlargement; mild TR; mild pulm valve regurg     Allergies Allergies  Allergen Reactions  . Codeine Nausea And Vomiting  . Demerol Nausea And Vomiting    Home Medications    Prior to Admission medications   Medication Sig Start Date End Date Taking? Authorizing Provider  apixaban (ELIQUIS) 5 MG TABS tablet Take 5 mg by mouth 2 (two) times daily.   Yes [provider]  atorvastatin (LIPITOR) 40 MG tablet Take 40 mg by mouth at bedtime. Reported on 01/11/2016 11/22/15  Yes [provider]  Blood Glucose Monitoring Suppl (ONETOUCH VERIO FLEX SYSTEM) W/DEVICE KIT 1 each by Does not apply route daily. Dx: E11.9 03/30/15  Yes Philemon Kingdom, MD  carvedilol (COREG) 25 MG tablet Take 1 tablet by mouth 2 (two) times daily. 04/05/20  Yes [provider]  cholecalciferol (VITAMIN D3) 25 MCG (1000 UNIT) tablet Take 1,000 Units by mouth daily.   Yes [provider]  ferrous sulfate 325 (65 FE) MG tablet Take 1 tablet by mouth 3 (three) times a week. 03/21/20  Yes [provider]  furosemide (LASIX) 20 MG tablet Take 3 tablets (60 mg total) by mouth in the morning and at bedtime. 09/05/20  Yes Freada Bergeron, MD  glipiZIDE (GLUCOTROL) 10 MG tablet Take 10 mg by mouth 2 (two) times daily before a meal.   Yes [provider]  glucose blood (ONETOUCH VERIO) test strip Use to test blood sugar 2-3 times daily as instructed. Dx: E11.9 10/12/15  Yes Philemon Kingdom, MD  levothyroxine (SYNTHROID) 112 MCG tablet Take 224 mcg by mouth daily before breakfast.   Yes [provider]  losartan (COZAAR) 100 MG tablet Take 100 mg by mouth daily.   Yes [provider]  metFORMIN (GLUCOPHAGE-XR) 500 MG 24 hr tablet TAKE 4 TABLETS BY MOUTH  DAILY WITH DINNER Patient taking differently: TAKE 4 TABLETS BY MOUTH  DAILY WITH DINNER 12/24/17  Yes Philemon Kingdom, MD  Multiple Vitamins-Minerals (MULTIVITAMIN WITH MINERALS) tablet Take 1 tablet by mouth daily.   Yes [provider]  Multiple Vitamins-Minerals (PRESERVISION AREDS 2+MULTI VIT PO) Take 1 capsule by mouth in the morning and at bedtime.   Yes [provider]  nitroGLYCERIN (NITROSTAT) 0.4 MG SL tablet Place 1 tablet under the tongue as needed. 08/17/20  Yes [provider]  omeprazole (PRILOSEC) 20 MG capsule Take 1 capsule by mouth daily. 01/18/20  Yes [provider]  Jonetta Speak LANCETS FINE MISC Use to test blood sugar 2-3 times daily as instructed. Dx: E11.9 10/12/15  Yes Philemon Kingdom, MD  VENTOLIN HFA 108 (90 Base) MCG/ACT inhaler Inhale 1-2 puffs into the lungs every 4 (four) hours as needed for  wheezing or shortness of breath.  07/12/15  Yes [provider]  vitamin B-12 (CYANOCOBALAMIN) 500 MCG tablet Take 1 tablet by mouth 2 (two) times daily. 03/21/20  Yes [provider]  isosorbide mononitrate (IMDUR) 30 MG 24 hr tablet Take 1 tablet (30 mg total) by mouth daily. 09/05/20   Freada Bergeron, MD    Family History    Family History  Problem Relation Age of Onset  . Cancer Maternal Aunt        colon  . Cancer Maternal Aunt        lung  . Thyroid disease Mother   . Hyperlipidemia Father   . Diabetes Maternal Grandmother   . Asthma Child   . Diabetes type I Child    He indicated that his mother is alive. He indicated that his father is deceased. He  indicated that the status of his maternal grandmother is unknown. He indicated that both of his maternal aunts are deceased.   Social History    Social History   Socioeconomic History  . Marital status: Married    Spouse name: sandra  . Number of children: 3  . Years of education: tech  . Highest education level: Not on file  Occupational History    Employer: [employer]  Tobacco Use  . Smoking status: Current Every Day Smoker    Packs/day: 0.50    Years: 50.00    Pack years: 25.00    Types: Cigarettes  . Smokeless tobacco: Never Used  Vaping Use  . Vaping Use: Never used  Substance and Sexual Activity  . Alcohol use: No  . Drug use: No  . Sexual activity: Yes  Other Topics Concern  . Not on file  Social History Narrative  . Not on file   Social Determinants of Health   Financial Resource Strain: Not on file  Food Insecurity: Not on file  Transportation Needs: Not on file  Physical Activity: Not on file  Stress: Not on file  Social Connections: Not on file  Intimate Partner Violence: Not on file     Review of Systems    General:  No chills, fever, night sweats or weight changes.  Cardiovascular:  No chest pain, dyspnea on exertion, edema, orthopnea, palpitations, paroxysmal nocturnal  dyspnea. Dermatological: No rash, lesions/masses Respiratory: No cough, dyspnea Urologic: No hematuria, dysuria Abdominal:   No nausea, vomiting, diarrhea, bright red blood per rectum, melena, or hematemesis Neurologic:  No visual changes, wkns, changes in mental status. All other systems reviewed and are otherwise negative except as noted above.  Physical Exam    BP 137/76   Pulse 64   Temp 97.9 F (36.6 C) (Oral)   Resp (!) 21   Ht '5\' 10"'  (1.778 m)   Wt 115.7 kg   SpO2 100%   BMI 36.59 kg/m  General: Alert, NAD sitting up preparing his CPAP HEENT: Normal  Neck: No bruits and JVP difficult to assess given habitus. Lungs:  Resp regular and unlabored, CTA bilaterally. Heart: Irregular rhythm with normal rate.  SEM present, mid peaking, A2 present. Abdomen: Soft, non-tender, non-distended, BS +.  Extremities: Warm. No clubbing, cyanosis or edema. DP/PT/Radials 2+ and equal bilaterally. Psych: Normal affect. Neuro: Alert and oriented. No gross focal deficits. No abnormal movements.  Labs    Troponin (Point of Care Test) No results for input(s): TROPIPOC in the last 72 hours. No results for input(s): CKTOTAL, CKMB, TROPONINI in the last 72 hours. Lab Results  Component Value Date   WBC 7.3 09/05/2020   HGB 9.2 (L) 09/05/2020   HCT 32.4 (L) 09/05/2020   MCV 76.1 (L) 09/05/2020   PLT 191 09/05/2020    Recent Labs  Lab 09/05/20 2141  NA 141  K 3.5  CL 103  CO2 28  BUN 21  CREATININE 0.94  CALCIUM 9.2  PROT 6.1*  BILITOT 1.1  ALKPHOS 86  ALT 21  AST 23  GLUCOSE 240*   Lab Results  Component Value Date   CHOL 106 04/12/2016   HDL 27.50 (L) 04/12/2016   LDLCALC 57 04/12/2016   TRIG 109.0 04/12/2016   No results found for: [facility]   Radiology Studies    DG Chest Port 1 View  Result Date: 09/06/2020 CLINICAL DATA:  Chest pain EXAM: PORTABLE CHEST 1 VIEW COMPARISON:  11/07/2014 FINDINGS: The heart size and mediastinal  contours are within normal limits. Both  lungs are clear. The visualized skeletal structures are unremarkable. IMPRESSION: No active disease. Electronically Signed   By: Fidela Salisbury MD   On: 2020-09-25 04:14    ECG & Cardiac Imaging    The following studies were reviewed today: TTE Sep 25, 2013: Study Conclusions   - Left ventricle: The cavity size was normal. Wall thickness  was increased in a pattern of mild LVH. Systolic function  was normal. The estimated ejection fraction was in the  range of 55% to 60%. Wall motion was normal; there were no  regional wall motion abnormalities.  - Left atrium: The atrium was severely dilated.  - Right ventricle: The cavity size was mildly dilated. Wall  thickness was normal.  Impressions:   - No cardiac source of emboli was indentified. Compared to  the prior study, there has been no significant interval  change.  Transthoracic echocardiography. M-mode, complete 2D,  spectral Doppler, and color Doppler. Height: Height:  179.1cm. Height: 70.5in. Weight: Weight: 123.3kg. Weight:  271.3lb. Body mass index: BMI: 38.5kg/m^2. Body surface  area:  BSA: 2.92m2. Blood pressure:   147/66. Patient  status: Inpatient. Location: Echo laboratory.   ------------------------------------------------------------   ------------------------------------------------------------  Left ventricle: The cavity size was normal. Wall thickness  was increased in a pattern of mild LVH. Systolic function  was normal. The estimated ejection fraction was in the range  of 55% to 60%. Wall motion was normal; there were no  regional wall motion abnormalities.   ------------------------------------------------------------  Aortic valve:  Mildly thickened, mildly calcified leaflets.  Cusp separation was normal. Doppler: Transvalvular  velocity was minimally increased. There was no stenosis. No  regurgitation.  Peak velocity ratio of LVOT to aortic  valve: 0.5.  Mean gradient:  142mHg (S). Peak gradient:  1779mg (S).   ------------------------------------------------------------  Aorta: The aorta was normal, not dilated, and non-diseased.    ------------------------------------------------------------  Mitral valve:  Structurally normal valve.  Leaflet  separation was normal. Doppler: Transvalvular velocity was  within the normal range. There was no evidence for stenosis.  No regurgitation.  Peak gradient: 3mm65m (D).   ------------------------------------------------------------  Left atrium: The atrium was severely dilated.   ------------------------------------------------------------  Right ventricle: The cavity size was mildly dilated. Wall  thickness was normal. Systolic function was normal.   ------------------------------------------------------------  Pulmonic valve:  Structurally normal valve.  Cusp  separation was normal. Doppler: Transvalvular velocity was  within the normal range. Trivial regurgitation.   ------------------------------------------------------------  Tricuspid valve:  Structurally normal valve.  Leaflet  separation was normal. Doppler: Transvalvular velocity was  within the normal range. No regurgitation.   ------------------------------------------------------------  Right atrium: The atrium was normal in size.   ------------------------------------------------------------  Pericardium: The pericardium was normal in appearance.  There was no pericardial effusion.   ------------------------------------------------------------  Systemic veins:  Inferior vena cava: The vessel was normal in size; the  respirophasic diameter changes were in the normal range (=  50%); findings are consistent with normal central venous  pressure.   ------------------------------------------------------------  Post procedure conclusions  Ascending Aorta:   - The aorta was normal, not dilated, and  non-diseased.    Vascular Ultrasound [id number] Duplex (Doppler)has been completed. Preliminary findings: Right = 40-59% ICA stenosis. Left = 1-39% ICA stenosis. Antegrade vertebral flow. Due to calcific plaque with acoustic shadowing, higher velocities may be obscured.  Myoview in 08/2020 per report: LAD or RCA ischemia  Carotids 10/2019: 50-69% RICA  Assessment & Plan  75 year old male with mild/mod AS, atrial fibrillation, CAD, tobacco use, HTN, obesity, OSA, prior CVA, T2DM, morbid obesity, hyperlipidemia who presents as direct admission for evaluation of chest pain and shortness of breath in the context of (presumed) recent high risk stress test with ischemia in LAD/RCA territories.   Problem list Acute on chronic heart failure Chest pain Atrial fibrillation C2V 7 T2DM HTN OSA on CPAP Morbid obesity Anemia  Plan Acute on chronic heart failure - Start lasix 40 mg IV TID - BID electrolytes, replete as necessary - Telemetry - TTE - Continue carvedilol 25 mg BID - Continue losartan 100 mg daily  Chest pain, recent positive stress test - Plan [facility] Thursday.  Diuresis prior - Cotninue atorvastatin 40 mg daily  Atrial fibrillation C2V 7 - Rate control strategy for now.  Suspect he would benefit from rhythm control long term.  Rate controlled on carvedilol as above. - Hold eliquis PM dose in anticipation of angiogram  T2DM - Hold home glipizide, metformin - SSI as inpatient  HTN - Losartan, metoprolol as above  OSA on CPAP Morbid obesity Anemia - Iron studies, reticulocyte count  Nutrition: Heart healthy DVT ppx: Apixaban GI ppx: Home omeprazole Advanced Care Planning: Full code   Signed, Delight Hoh, MD 09/06/2020, 5:54 AM

## 2020-09-06 NOTE — ED Notes (Signed)
Lunch Ordered @ 1000. °

## 2020-09-06 NOTE — Progress Notes (Deleted)
Progress Note  Patient Name: Mason Patton Date of Encounter: 09/06/2020  [facility] Cardiologist: [name], MD   Subjective   No pain, tired, sitting up and eating.   Inpatient Medications    Scheduled Meds: . apixaban  5 mg Oral BID  . aspirin EC  81 mg Oral Daily  . atorvastatin  40 mg Oral QHS  . carvedilol  25 mg Oral BID  . furosemide  40 mg Intravenous TID  . insulin aspart  0-20 Units Subcutaneous TID WC  . insulin aspart  0-5 Units Subcutaneous QHS  . insulin aspart  6 Units Subcutaneous TID WC  . levothyroxine  224 mcg Oral QAC breakfast  . losartan  100 mg Oral Daily  . sodium chloride flush  3 mL Intravenous Q12H   Continuous Infusions: . sodium chloride     PRN Meds: sodium chloride, acetaminophen, ondansetron (ZOFRAN) IV, sodium chloride flush   Vital Signs    Vitals:   09/06/20 0745 09/06/20 0800 09/06/20 0815 09/06/20 0830  BP: (!) 139/91 136/86 (!) 140/94 115/69  Pulse: (!) 54 (!) 59 (!) 58 (!) 57  Resp: 19 18 17 16   Temp:      TempSrc:      SpO2: 98% 98% 97% 97%  Weight:      Height:       No intake or output data in the 24 hours ending 09/06/20 0919 Last 3 Weights 09/05/2020 09/05/2020 05/22/2020  Weight (lbs) 255 lb 260 lb 6.4 oz 261 lb  Weight (kg) 115.667 kg 118.117 kg 118.389 kg      Telemetry    Atrial fibrillation with controlled-slow ventricular response - Personally Reviewed  ECG    No new tracings - Personally Reviewed  Physical Exam   GEN: No acute distress.   Neck: mild JVD Cardiac: irregular rhythm, bradycardic rate, 4/6 systolic murmur  Respiratory: Clear to auscultation bilaterally. GI: Soft, nontender, non-distended  MS: 1+ B LE edema, No deformity. Neuro:  Nonfocal  Psych: Normal affect   Labs    High Sensitivity Troponin:   Recent Labs  Lab 09/05/20 2141 09/06/20 0043  TROPONINIHS 17 20*      Chemistry Recent Labs  Lab 09/05/20 1651 09/05/20 2141  NA 143 141  K 4.4 3.5  CL 103  103  CO2 24 28  GLUCOSE 212* 240*  BUN 21 21  CREATININE 0.90 0.94  CALCIUM 9.4 9.2  PROT  --  6.1*  ALBUMIN  --  3.5  AST  --  23  ALT  --  21  ALKPHOS  --  86  BILITOT  --  1.1  GFRNONAA  --  >60  ANIONGAP  --  10     Hematology Recent Labs  Lab 09/05/20 1715 09/05/20 2141  WBC 7.9 7.3  RBC 4.45 4.26  HGB 9.8* 9.2*  HCT 32.6* 32.4*  MCV 73* 76.1*  MCH 22.0* 21.6*  MCHC 30.1* 28.4*  RDW 16.6* 17.6*  PLT 198 191    BNP Recent Labs  Lab 09/05/20 1651 09/06/20 0703  BNP  --  803.0*  PROBNP 3,817*  --      DDimer No results for input(s): DDIMER in the last 168 hours.   Radiology    DG Chest Port 1 View  Result Date: 09/06/2020 CLINICAL DATA:  Chest pain EXAM: PORTABLE CHEST 1 VIEW COMPARISON:  11/07/2014 FINDINGS: The heart size and mediastinal contours are within normal limits. Both lungs are clear. The visualized skeletal  structures are unremarkable. IMPRESSION: No active disease. Electronically Signed   By: Helyn Numbers MD   On: 09/06/2020 04:14    Cardiac Studies   Echo pending   Right and left heart cath tomorrow  Patient Profile     75 y.o. male with mild/mod AS, atrial fibrillation, CAD, tobacco use, HTN, obesity, OSA, prior CVA, T2DM, morbid obesity, hyperlipidemia who presents as direct admission for evaluation of chest pain and shortness of breath.  Pt had abnormal stress test at [facility] and was referred to cardiology. Yesterday, reported symptoms concerning for unstable angina and worsening dyspnea/edema. Lasix  Increased back to 60 mg BID. Labs drawn in office yesterday included CE which was mildly positive. Given his symptoms and abnormal stress test, he was sent to the ER.   Assessment & Plan    Acute congestive heart failure - suspect diastolic dysfunction - echo pending - has been titrated to 60 mg lasix BID OP --> recently reduced to daily after 13 lb weight loss - BNP 803 - he was admitted and started on 40 mg IV lasix - has received one  dose - no I&Os charted - weight 255 lbs - will continue 40 mg IV lasix - echo pending - planning for right and left heart cath tomorrow - timing needs to be discussed with lab since eliquis was not held this morning   Elevated troponin Chest pain concerning for angina - Troponin T 28 in office - HST 17 --> 20 - EKG is nonischemic - angiography tomorrow   Hypertension - on losartan and coreg - home imdur was held    Atrial fibrillation Chronic anticoagulation - holding eliquis for heart cath tomorrow - last dose of eliquis 0922 09/06/20 - he missed PM dose on 09/05/20 - Mg 1.6 - will give 4g IV   Hyperlipidemia - obtain fasting lipid - continue 40 mg lipitor   Aortic stenosis - mild to moderate  - echo pending   OSA on CPAP - compliant   Anemia - Hb 9.2 (9.8) - will repeat CBC today - holding eliquis - no signs of bleeding       For questions or updates, please contact [facility] Please consult [email or web address] for contact info under        Signed, Marcelino Duster, PA  09/06/2020, 9:19 AM

## 2020-09-06 NOTE — Progress Notes (Signed)
ANTICOAGULATION CONSULT NOTE  Pharmacy Consult for heparin Indication: atrial fibrillation  Allergies  Allergen Reactions  . Codeine Nausea And Vomiting  . Demerol Nausea And Vomiting    Patient Measurements: Height: 5\' 10"  (177.8 cm) Weight: 115.7 kg (255 lb) IBW/kg (Calculated) : 73 Heparin Dosing Weight: 98.6kg  Vital Signs: BP: 137/91 (03/02 1900) Pulse Rate: 72 (03/02 1900)  Labs: Recent Labs    09/05/20 1651 09/05/20 1715 09/05/20 2141 09/06/20 0043 09/06/20 1915  HGB  --  9.8* 9.2*  --   --   HCT  --  32.6* 32.4*  --   --   PLT  --  198 191  --   --   APTT  --   --  39*  --  53*  LABPROT  --   --  16.7*  --   --   INR  --   --  1.4*  --   --   HEPARINUNFRC  --   --   --   --  1.96*  CREATININE 0.90  --  0.94  --   --   TROPONINIHS  --   --  17 20*  --     Estimated Creatinine Clearance: 87.9 mL/min (by C-G formula based on SCr of 0.94 mg/dL).   Medical History: Past Medical History:  Diagnosis Date  . Aortic stenosis    09/30/19 echo ([facility], Cardiologist Dr. 10/02/19): Mild-moderate AS, MaxVel 308 cm/s, MeanPG 22 mmHg, AVA 1.6 cm2  . Arthritis   . Atrial fibrillation (HCC)   . Carotid artery stenosis    11/03/19 11/05/19 ([facility]): 50-69% RICA stenosis  . Cholecystitis with cholelithiasis   . Eczema   . Emphysema of lung (HCC)   . Gallstones and inflammation of gallbladder without obstruction   . Heart murmur   . History of nuclear stress test 02/23/2010   dipyridamole; normal pattern of perfusion in all regions; normal, low risk study   . Hyperlipidemia   . Hypertension   . 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 Meningoencephalitis   . Obesity   . OSA on CPAP    last sleep study >8 yrs  . Pneumonia    "twice" (05/19/2013)  . Shortness of breath   . Stroke [facility])    "they said I had a minor one when I had menigitis" (05/19/2013)  . Thyroid disease   . Tobacco abuse    quit 11/08/2012  . Type II  diabetes mellitus (HCC)   . Umbilical hernia     Assessment: 74 YOM presenting with SOB, hx of afib on Eliquis PTA (last dose 3/1). Pharmacy consulted to start heparin drip while Eliquis on hold for planned cath.  Initial aPTT low at 53 tonight. Heparin level elevated due to influence of Eliquis, as expected. Hg 9.2, plt wnl (last CBC 3/1). No bleeding or issues with infusion per discussion with RN.  Goal of Therapy:  Heparin level 0.3-0.7 units/ml aPTT 66-102 seconds Monitor platelets by anticoagulation protocol: Yes   Plan:  No bolus. Increase heparin IV to 1600 units/hr Check 8hr aPTT Monitor daily aPTT and heparin level until correlating, CBC, s/sx bleeding F/u post-cath for ability to restart Eliquis   66, PharmD, BCPS Please check AMION for all [facility] contact numbers Clinical Pharmacist 09/06/2020 8:18 PM

## 2020-09-06 NOTE — ED Notes (Signed)
US at bedside

## 2020-09-06 NOTE — Progress Notes (Signed)
ANTICOAGULATION CONSULT NOTE - Initial Consult  Pharmacy Consult for heparin Indication: atrial fibrillation  Allergies  Allergen Reactions  . Codeine Nausea And Vomiting  . Demerol Nausea And Vomiting    Patient Measurements: Height: 5\' 10"  (177.8 cm) Weight: 115.7 kg (255 lb) IBW/kg (Calculated) : 73 Heparin Dosing Weight: 98.6kg  Vital Signs: BP: 136/75 (03/02 0945) Pulse Rate: 55 (03/02 0945)  Labs: Recent Labs    09/05/20 1651 09/05/20 1715 09/05/20 2141 09/06/20 0043  HGB  --  9.8* 9.2*  --   HCT  --  32.6* 32.4*  --   PLT  --  198 191  --   APTT  --   --  39*  --   LABPROT  --   --  16.7*  --   INR  --   --  1.4*  --   CREATININE 0.90  --  0.94  --   TROPONINIHS  --   --  17 20*    Estimated Creatinine Clearance: 87.9 mL/min (by C-G formula based on SCr of 0.94 mg/dL).   Medical History: Past Medical History:  Diagnosis Date  . Aortic stenosis    09/30/19 echo ([facility], Cardiologist Dr. 10/02/19): Mild-moderate AS, MaxVel 308 cm/s, MeanPG 22 mmHg, AVA 1.6 cm2  . Arthritis   . Atrial fibrillation (HCC)   . Carotid artery stenosis    11/03/19 11/05/19 ([facility]): 50-69% RICA stenosis  . Cholecystitis with cholelithiasis   . Eczema   . Emphysema of lung (HCC)   . Gallstones and inflammation of gallbladder without obstruction   . Heart murmur   . History of nuclear stress test 02/23/2010   dipyridamole; normal pattern of perfusion in all regions; normal, low risk study   . Hyperlipidemia   . Hypertension   . 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 Meningoencephalitis   . Obesity   . OSA on CPAP    last sleep study >8 yrs  . Pneumonia    "twice" (05/19/2013)  . Shortness of breath   . Stroke [facility])    "they said I had a minor one when I had menigitis" (05/19/2013)  . Thyroid disease   . Tobacco abuse    quit 11/08/2012  . Type II diabetes mellitus (HCC)   . Umbilical hernia     Assessment: 22 YOM  presenting with SOB, hx of afib on Eliquis PTA (last dose 3/1), pharmacy consulted to start heparin gtt while Eliquis on hold for planned cath.  Goal of Therapy:  Heparin level 0.3-0.7 units/ml aPTT 66-102 seconds Monitor platelets by anticoagulation protocol: Yes   Plan:  Heparin gtt at 1400 units/hr, no bolus F/u 8 hour aPTT/HL F/u post cath for ability to restart Eliquis  66, PharmD Clinical Pharmacist ED Pharmacist Phone # [phone] 09/06/2020 10:42 AM

## 2020-09-06 NOTE — ED Provider Notes (Signed)
[place] EMERGENCY DEPARTMENT Provider Note   CSN: [id number] Arrival date & time: 09/05/20  2025     History Chief Complaint  Patient presents with  . Chest Pain  . Abnormal Lab    Mason Patton is a 75 y.o. male.  The history is provided by the patient and the spouse.  Chest Pain Pain location:  Substernal area Pain quality: pressure   Pain severity:  Moderate Onset quality:  Gradual Timing:  Intermittent Progression:  Improving Chronicity:  New Relieved by:  Rest Worsened by:  Exertion Associated symptoms: shortness of breath   Associated symptoms: no fever   Abnormal Lab     Patient with history of atrial fibrillation on anticoagulation, aortic stenosis, hypertension presents with chest pain.  Patient has had intermittent episodes of exertional chest pain.  He has been evaluated by cardiology and had a recent positive stress test.  Patient was planned to have a cardiac cath later this week, but due to ongoing pain and elevated troponin he was advised to go to the ER. Patient reports he is chest pain-free at this time Past Medical History:  Diagnosis Date  . Aortic stenosis    09/30/19 echo ([facility], Cardiologist Dr. Geraldo Pitter): Mild-moderate AS, MaxVel 308 cm/s, MeanPG 22 mmHg, AVA 1.6 cm2  . Arthritis   . Atrial fibrillation ([place])   . Carotid artery stenosis    11/03/19 [place] ([facility]): 50-69% RICA stenosis  . Cholecystitis with cholelithiasis   . Eczema   . Emphysema of lung ([place])   . Gallstones and inflammation of gallbladder without obstruction   . Heart murmur   . History of nuclear stress test 02/23/2010   dipyridamole; normal pattern of perfusion in all regions; normal, low risk study   . Hyperlipidemia   . Hypertension   . 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 Meningoencephalitis   . Obesity   . OSA on CPAP    last sleep study >8 yrs  . Pneumonia    "twice" (05/19/2013)  .  Shortness of breath   . Stroke [facility])    "they said I had a minor one when I had menigitis" (05/19/2013)  . Thyroid disease   . Tobacco abuse    quit 11/08/2012  . Type II diabetes mellitus ([place])   . Umbilical hernia     Patient Active Problem List   Diagnosis Date Noted  . Arthritis of right knee 11/24/2019  . Unilateral primary osteoarthritis, right knee   . Uncontrolled type 2 diabetes mellitus with peripheral neuropathy ([place]) 03/27/2015  . 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 CVA (cerebral infarction) 11/16/2013  . Dizziness 11/15/2013  . Left sided lacunar infarction ([place]) 11/15/2013  . Lacunar infarction ([place]) 06/01/2013  . Acute, but ill-defined, cerebrovascular disease 06/01/2013  . 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 Acute CVA (cerebrovascular accident) ([place]) 03/29/2013  . Numbness and tingling 03/27/2013  . Hyperglycemia 03/26/2013  . Meningitis 03/25/2013  . Encephalopathy acute 03/23/2013  . Fever 03/23/2013  . OSA on CPAP 03/23/2013  . Obesity 03/23/2013  . Murmur 03/16/2013  . Preoperative cardiovascular examination 03/16/2013  . Dyslipidemia 03/16/2013  . Hypertension   . Cholecystitis with cholelithiasis   . Apnea, sleep 01/23/2010    Past Surgical History:  Procedure Laterality Date  . CHOLECYSTECTOMY  09/09/2011   Procedure: LAPAROSCOPIC CHOLECYSTECTOMY WITH INTRAOPERATIVE CHOLANGIOGRAM;  Surgeon: Belva Crome, MD;  Location: [place];  Service: General;  Laterality: N/A;  . HERNIA REPAIR  04/2006  UHR  . JOINT REPLACEMENT    . KNEE ARTHROSCOPY Right    "w2 times" (05/19/2013)  . TONSILLECTOMY    . TOTAL KNEE ARTHROPLASTY Left 05/19/2013  . TOTAL KNEE ARTHROPLASTY Left 05/19/2013   Procedure: TOTAL KNEE ARTHROPLASTY;  Surgeon: Newt Minion, MD;  Location: [place];  Service: Orthopedics;  Laterality: Left;  Left Total Knee Arthroplasty  . TOTAL KNEE ARTHROPLASTY Right 11/24/2019  . TOTAL KNEE ARTHROPLASTY Right 11/24/2019   Procedure: RIGHT TOTAL  KNEE ARTHROPLASTY;  Surgeon: Newt Minion, MD;  Location: [place];  Service: Orthopedics;  Laterality: Right;  . TRANSTHORACIC ECHOCARDIOGRAM  03/28/2005   EF=>55%, mod conc LVH; LA mod dilated & borderline RA enlargement; mild TR; mild pulm valve regurg       Family History  Problem Relation Age of Onset  . Cancer Maternal Aunt        colon  . Cancer Maternal Aunt        lung  . Thyroid disease Mother   . Hyperlipidemia Father   . Diabetes Maternal Grandmother   . Asthma Child   . Diabetes type I Child     Social History   Tobacco Use  . Smoking status: Current Every Day Smoker    Packs/day: 0.50    Years: 50.00    Pack years: 25.00    Types: Cigarettes  . Smokeless tobacco: Never Used  Vaping Use  . Vaping Use: Never used  Substance Use Topics  . Alcohol use: No  . Drug use: No    Home Medications Prior to Admission medications   Medication Sig Start Date End Date Taking? Authorizing Provider  apixaban (ELIQUIS) 5 MG TABS tablet Take 5 mg by mouth 2 (two) times daily.    [provider]  atorvastatin (LIPITOR) 40 MG tablet Take 40 mg by mouth at bedtime. Reported on 01/11/2016 11/22/15   [provider]  Blood Glucose Monitoring Suppl (ONETOUCH VERIO FLEX SYSTEM) W/DEVICE KIT 1 each by Does not apply route daily. Dx: E11.9 03/30/15   Philemon Kingdom, MD  carvedilol (COREG) 25 MG tablet Take 1 tablet by mouth 2 (two) times daily. 04/05/20   [provider]  cholecalciferol (VITAMIN D3) 25 MCG (1000 UNIT) tablet Take 1,000 Units by mouth daily.    [provider]  ferrous sulfate 325 (65 FE) MG tablet Take 1 tablet by mouth 3 (three) times a week. 03/21/20   [provider]  furosemide (LASIX) 20 MG tablet Take 3 tablets (60 mg total) by mouth in the morning and at bedtime. 09/05/20   Freada Bergeron, MD  glipiZIDE (GLUCOTROL) 10 MG tablet Take 10 mg by mouth 2 (two) times daily before a meal.    [provider]   glucose blood (ONETOUCH VERIO) test strip Use to test blood sugar 2-3 times daily as instructed. Dx: E11.9 10/12/15   Philemon Kingdom, MD  isosorbide mononitrate (IMDUR) 30 MG 24 hr tablet Take 1 tablet (30 mg total) by mouth daily. 09/05/20   Freada Bergeron, MD  levothyroxine (SYNTHROID) 112 MCG tablet Take 224 mcg by mouth daily before breakfast.    [provider]  losartan (COZAAR) 100 MG tablet Take 100 mg by mouth daily.    [provider]  metFORMIN (GLUCOPHAGE-XR) 500 MG 24 hr tablet TAKE 4 TABLETS BY MOUTH  DAILY WITH DINNER Patient taking differently: TAKE 4 TABLETS BY MOUTH  DAILY WITH DINNER 12/24/17   Philemon Kingdom, MD  Multiple Vitamins-Minerals (MULTIVITAMIN WITH  MINERALS) tablet Take 1 tablet by mouth daily.    [provider]  Multiple Vitamins-Minerals (PRESERVISION AREDS 2+MULTI VIT PO) Take 1 capsule by mouth in the morning and at bedtime.    [provider]  nitroGLYCERIN (NITROSTAT) 0.4 MG SL tablet Place 1 tablet under the tongue as needed. 08/17/20   [provider]  omeprazole (PRILOSEC) 20 MG capsule Take 1 capsule by mouth daily. 01/18/20   [provider]  Jonetta Speak LANCETS FINE MISC Use to test blood sugar 2-3 times daily as instructed. Dx: E11.9 10/12/15   Philemon Kingdom, MD  oxyCODONE (OXY IR/ROXICODONE) 5 MG immediate release tablet Take 1 tablet (5 mg total) by mouth every 4 (four) hours as needed for moderate pain (pain score 4-6). 11/30/19   Persons, Bevely Palmer, PA  VENTOLIN HFA 108 (90 Base) MCG/ACT inhaler Inhale 1-2 puffs into the lungs every 4 (four) hours as needed for wheezing or shortness of breath.  07/12/15   [provider]  vitamin B-12 (CYANOCOBALAMIN) 500 MCG tablet Take 1 tablet by mouth 2 (two) times daily. 03/21/20   [provider]    Allergies    Codeine and Demerol  Review of Systems   Review of Systems  Constitutional: Negative for fever.  Respiratory: Positive  for shortness of breath.   Cardiovascular: Positive for chest pain and leg swelling.  All other systems reviewed and are negative.   Physical Exam Updated Vital Signs BP (!) 151/91   Pulse 68   Temp 97.9 F (36.6 C) (Oral)   Resp 19   Ht 1.778 m ('5\' 10"' )   Wt 115.7 kg   SpO2 100%   BMI 36.59 kg/m   Physical Exam  CONSTITUTIONAL: Elderly, no acute distress HEAD: Normocephalic/atraumatic EYES: EOMI/PERRL ENMT: Mucous membranes moist NECK: supple no meningeal signs SPINE/BACK:entire spine nontender CV: V9/D6 noted, systolic murmur noted LUNGS: Lungs are clear to auscultation bilaterally, no apparent distress ABDOMEN: soft, nontender, no rebound or guarding, bowel sounds noted throughout abdomen GU:no cva tenderness NEURO: Pt is awake/alert/appropriate, moves all extremitiesx4.  No facial droop.   EXTREMITIES: pulses normal/equal, full ROM, symmetric lower extremity edema noted SKIN: warm, color normal PSYCH: no abnormalities of mood noted, alert and oriented to situation  ED Results / Procedures / Treatments   Labs (all labs ordered are listed, but only abnormal results are displayed) Labs Reviewed  CBC WITH DIFFERENTIAL/PLATELET - Abnormal; Notable for the following components:      Result Value   Hemoglobin 9.2 (*)    HCT 32.4 (*)    MCV 76.1 (*)    MCH 21.6 (*)    MCHC 28.4 (*)    RDW 17.6 (*)    All other components within normal limits  PROTIME-INR - Abnormal; Notable for the following components:   Prothrombin Time 16.7 (*)    INR 1.4 (*)    All other components within normal limits  APTT - Abnormal; Notable for the following components:   aPTT 39 (*)    All other components within normal limits  COMPREHENSIVE METABOLIC PANEL - Abnormal; Notable for the following components:   Glucose, Bld 240 (*)    Total Protein 6.1 (*)    All other components within normal limits  TROPONIN I (HIGH SENSITIVITY) - Abnormal; Notable for the following components:    Troponin I (High Sensitivity) 20 (*)    All other components within normal limits  SARS CORONAVIRUS 2 (TAT 6-24 HRS)  TROPONIN I (HIGH SENSITIVITY)  EKG EKG Interpretation  Date/Time:  Tuesday September 05 2020 21:28:34 EST Ventricular Rate:  63 PR Interval:    QRS Duration: 110 QT Interval:  444 QTC Calculation: 454 R Axis:   -48 Text Interpretation: Atrial fibrillation Incomplete right bundle branch block Left anterior fascicular block Septal infarct , age undetermined ST & T wave abnormality, consider lateral ischemia Abnormal ECG Interpretation limited secondary to artifact Confirmed by [place], Grayer ([id number]) on 09/06/2020 3:23:28 AM   Radiology DG Chest Port 1 View  Result Date: 09/06/2020 CLINICAL DATA:  Chest pain EXAM: PORTABLE CHEST 1 VIEW COMPARISON:  11/07/2014 FINDINGS: The heart size and mediastinal contours are within normal limits. Both lungs are clear. The visualized skeletal structures are unremarkable. IMPRESSION: No active disease. Electronically Signed   By: Fidela Salisbury MD   On: 09/06/2020 04:14    Procedures .Critical Care Performed by: Ripley Fraise, MD Authorized by: Ripley Fraise, MD   Critical care provider statement:    Critical care time (minutes):  30   Critical care start time:  09/06/2020 3:35 AM   Critical care end time:  09/06/2020 4:05 AM   Critical care time was exclusive of:  Separately billable procedures and treating other patients   Critical care was necessary to treat or prevent imminent or life-threatening deterioration of the following conditions:  Cardiac failure   Critical care was time spent personally by me on the following activities:  Discussions with consultants, examination of patient, obtaining history from patient or surrogate, pulse oximetry, ordering and review of radiographic studies, ordering and review of laboratory studies and review of old charts   I assumed direction of critical care for this patient from another  provider in my specialty: no       Medications Ordered in ED Medications  aspirin chewable tablet 324 mg (has no administration in time range)    ED Course  I have reviewed the triage vital signs and the nursing notes.  Pertinent labs & imaging results that were available during my care of the patient were reviewed by me and considered in my medical decision making (see chart for details).    MDM Rules/Calculators/A&P                          4:06 AM Patient with extensive medical history and multiple cardiac risk factors presents with exertional chest pain.  Patient has been evaluated as an outpatient by cardiology with a positive stress test.  He was planned to have cardiac cath and elevated troponin he was advised to go the ER. Patient is currently chest pain-free at this time. Discussed with cardiology fellow for admission Will defer heparin at this time as patient is on anticoagulation.   Final Clinical Impression(s) / ED Diagnoses Final diagnoses:  NSTEMI (non-ST elevated myocardial infarction) [facility])    Rx / [place] Orders ED Discharge Orders    None       Ripley Fraise, MD 09/06/20 [phone]

## 2020-09-06 NOTE — ED Notes (Addendum)
Called dietary for a meal tray. Delaying insulin until pt can eat something.

## 2020-09-06 NOTE — Progress Notes (Signed)
  Echocardiogram 2D Echocardiogram has been performed.  Mason Patton 09/06/2020, 10:33 AM

## 2020-09-06 NOTE — Progress Notes (Signed)
 Progress Note  Patient Name: Mason Patton Date of Encounter: 09/06/2020  [facility] Cardiologist: Heather E Pemberton, MD   Subjective   No pain, tired, sitting up and eating.   Inpatient Medications    Scheduled Meds: . apixaban  5 mg Oral BID  . aspirin EC  81 mg Oral Daily  . atorvastatin  40 mg Oral QHS  . carvedilol  25 mg Oral BID  . furosemide  40 mg Intravenous TID  . insulin aspart  0-20 Units Subcutaneous TID WC  . insulin aspart  0-5 Units Subcutaneous QHS  . insulin aspart  6 Units Subcutaneous TID WC  . levothyroxine  224 mcg Oral QAC breakfast  . losartan  100 mg Oral Daily  . sodium chloride flush  3 mL Intravenous Q12H   Continuous Infusions: . sodium chloride     PRN Meds: sodium chloride, acetaminophen, ondansetron (ZOFRAN) IV, sodium chloride flush   Vital Signs    Vitals:   09/06/20 0745 09/06/20 0800 09/06/20 0815 09/06/20 0830  BP: (!) 139/91 136/86 (!) 140/94 115/69  Pulse: (!) 54 (!) 59 (!) 58 (!) 57  Resp: 19 18 17 16  Temp:      TempSrc:      SpO2: 98% 98% 97% 97%  Weight:      Height:       No intake or output data in the 24 hours ending 09/06/20 0919 Last 3 Weights 09/05/2020 09/05/2020 05/22/2020  Weight (lbs) 255 lb 260 lb 6.4 oz 261 lb  Weight (kg) 115.667 kg 118.117 kg 118.389 kg      Telemetry    Atrial fibrillation with controlled-slow ventricular response - Personally Reviewed  ECG    No new tracings - Personally Reviewed  Physical Exam   GEN: No acute distress.   Neck: mild JVD Cardiac: irregular rhythm, bradycardic rate, 4/6 systolic murmur  Respiratory: Clear to auscultation bilaterally. GI: Soft, nontender, non-distended  MS: 1+ B LE edema, No deformity. Neuro:  Nonfocal  Psych: Normal affect   Labs    High Sensitivity Troponin:   Recent Labs  Lab 09/05/20 2141 09/06/20 0043  TROPONINIHS 17 20*      Chemistry Recent Labs  Lab 09/05/20 1651 09/05/20 2141  NA 143 141  K 4.4 3.5  CL 103  103  CO2 24 28  GLUCOSE 212* 240*  BUN 21 21  CREATININE 0.90 0.94  CALCIUM 9.4 9.2  PROT  --  6.1*  ALBUMIN  --  3.5  AST  --  23  ALT  --  21  ALKPHOS  --  86  BILITOT  --  1.1  GFRNONAA  --  >60  ANIONGAP  --  10     Hematology Recent Labs  Lab 09/05/20 1715 09/05/20 2141  WBC 7.9 7.3  RBC 4.45 4.26  HGB 9.8* 9.2*  HCT 32.6* 32.4*  MCV 73* 76.1*  MCH 22.0* 21.6*  MCHC 30.1* 28.4*  RDW 16.6* 17.6*  PLT 198 191    BNP Recent Labs  Lab 09/05/20 1651 09/06/20 0703  BNP  --  803.0*  PROBNP 3,817*  --      DDimer No results for input(s): DDIMER in the last 168 hours.   Radiology    DG Chest Port 1 View  Result Date: 09/06/2020 CLINICAL DATA:  Chest pain EXAM: PORTABLE CHEST 1 VIEW COMPARISON:  11/07/2014 FINDINGS: The heart size and mediastinal contours are within normal limits. Both lungs are clear. The visualized skeletal   structures are unremarkable. IMPRESSION: No active disease. Electronically Signed   By: Ashesh  Parikh MD   On: 09/06/2020 04:14    Cardiac Studies   Echo pending   Right and left heart cath tomorrow  Patient Profile     75 y.o. male with mild/mod AS, atrial fibrillation, CAD, tobacco use, HTN, obesity, OSA, prior CVA, T2DM, morbid obesity, hyperlipidemia who presents as direct admission for evaluation of chest pain and shortness of breath.  [facility]. Yesterday, reported symptoms concerning for unstable angina and worsening dyspnea/edema. Lasix  Increased back to 60 mg BID. Labs drawn in office yesterday included CE which was mildly positive. Given his symptoms and abnormal stress test, he was sent to the ER.   Assessment & Plan    Acute congestive heart failure - suspect diastolic dysfunction - echo pending - has been titrated to 60 mg lasix BID OP --> recently reduced to daily after 13 lb weight loss - BNP 803 - he was admitted and started on 40 mg IV lasix - has received one  dose - no I&Os charted - weight 255 lbs - will continue 40 mg IV lasix - echo pending - planning for right and left heart cath tomorrow - timing needs to be discussed with lab since eliquis was not held this morning   Elevated troponin Chest pain concerning for angina - Troponin T 28 in office - HST 17 --> 20 - EKG is nonischemic - angiography tomorrow   Hypertension - on losartan and coreg - home imdur was held    Atrial fibrillation Chronic anticoagulation - holding eliquis for heart cath tomorrow - last dose of eliquis 0922 09/06/20 - he missed PM dose on 09/05/20 - Mg 1.6 - will give 4g IV   Hyperlipidemia - obtain fasting lipid - continue 40 mg lipitor   Aortic stenosis - mild to moderate  - echo pending   OSA on CPAP - compliant   Anemia - Hb 9.2 (9.8) - will repeat CBC today - holding eliquis - no signs of bleeding       For questions or updates, please contact [facility] Please consult [email or web address] for contact info under        Signed, Marquinn Meschke Nicole Johnney [place], PA  09/06/2020, 9:19 AM    

## 2020-09-07 ENCOUNTER — Encounter (HOSPITAL_COMMUNITY): Payer: Self-pay | Admitting: Cardiovascular Disease

## 2020-09-07 ENCOUNTER — Ambulatory Visit (HOSPITAL_COMMUNITY)
Admission: RE | Admit: 2020-09-07 | Payer: No Typology Code available for payment source | Source: Home / Self Care | Admitting: Cardiovascular Disease

## 2020-09-07 ENCOUNTER — Encounter (HOSPITAL_COMMUNITY)
Admission: EM | Disposition: A | Discharge status: Home / Self Care | Payer: Self-pay | Source: Home / Self Care | Attending: Surgery

## 2020-09-07 DIAGNOSIS — I200 Unstable angina: Secondary | ICD-10-CM | POA: Diagnosis not present

## 2020-09-07 DIAGNOSIS — I350 Nonrheumatic aortic (valve) stenosis: Secondary | ICD-10-CM

## 2020-09-07 DIAGNOSIS — I25110 Atherosclerotic heart disease of native coronary artery with unstable angina pectoris: Principal | ICD-10-CM

## 2020-09-07 HISTORY — PX: RIGHT/LEFT HEART CATH AND CORONARY ANGIOGRAPHY: CATH118266

## 2020-09-07 LAB — POCT I-STAT EG7
Acid-Base Excess: 5 mmol/L — ABNORMAL HIGH (ref 0.0–2.0)
Acid-Base Excess: 4 mmol/L — ABNORMAL HIGH (ref 0.0–2.0)
Bicarbonate: 30 mmol/L — ABNORMAL HIGH (ref 20.0–28.0)
Bicarbonate: 29.7 mmol/L — ABNORMAL HIGH (ref 20.0–28.0)
Calcium, Ion: 1.25 mmol/L (ref 1.15–1.40)
Calcium, Ion: 1.22 mmol/L (ref 1.15–1.40)
HCT: 30 % — ABNORMAL LOW (ref 39.0–52.0)
HCT: 29 % — ABNORMAL LOW (ref 39.0–52.0)
Hemoglobin: 10.2 g/dL — ABNORMAL LOW (ref 13.0–17.0)
Hemoglobin: 9.9 g/dL — ABNORMAL LOW (ref 13.0–17.0)
O2 Saturation: 57 %
O2 Saturation: 56 %
Potassium: 3.8 mmol/L (ref 3.5–5.1)
Potassium: 3.7 mmol/L (ref 3.5–5.1)
Sodium: 142 mmol/L (ref 135–145)
Sodium: 142 mmol/L (ref 135–145)
TCO2: 31 mmol/L (ref 22–32)
TCO2: 31 mmol/L (ref 22–32)
pCO2, Ven: 47.8 mmHg (ref 44.0–60.0)
pCO2, Ven: 47.6 mmHg (ref 44.0–60.0)
pH, Ven: 7.406 (ref 7.250–7.430)
pH, Ven: 7.404 (ref 7.250–7.430)
pO2, Ven: 30 mmHg — CL (ref 32.0–45.0)
pO2, Ven: 30 mmHg — CL (ref 32.0–45.0)

## 2020-09-07 LAB — CBC
HCT: 30.2 % — ABNORMAL LOW (ref 39.0–52.0)
Hemoglobin: 8.7 g/dL — ABNORMAL LOW (ref 13.0–17.0)
MCH: 21.8 pg — ABNORMAL LOW (ref 26.0–34.0)
MCHC: 28.8 g/dL — ABNORMAL LOW (ref 30.0–36.0)
MCV: 75.7 fL — ABNORMAL LOW (ref 80.0–100.0)
Platelets: 150 10*3/uL (ref 150–400)
RBC: 3.99 MIL/uL — ABNORMAL LOW (ref 4.22–5.81)
RDW: 17.5 % — ABNORMAL HIGH (ref 11.5–15.5)
WBC: 6.5 10*3/uL (ref 4.0–10.5)
nRBC: 0 % (ref 0.0–0.2)

## 2020-09-07 LAB — BASIC METABOLIC PANEL
Anion gap: 10 (ref 5–15)
BUN: 18 mg/dL (ref 8–23)
CO2: 28 mmol/L (ref 22–32)
Calcium: 9 mg/dL (ref 8.9–10.3)
Chloride: 101 mmol/L (ref 98–111)
Creatinine, Ser: 0.83 mg/dL (ref 0.61–1.24)
GFR, Estimated: >60 mL/min (ref >60)
Glucose, Bld: 107 mg/dL — ABNORMAL HIGH (ref 70–99)
Potassium: 3.5 mmol/L (ref 3.5–5.1)
Sodium: 139 mmol/L (ref 135–145)

## 2020-09-07 LAB — CBG MONITORING, ED
Glucose-Capillary: 105 mg/dL — ABNORMAL HIGH (ref 70–99)
Glucose-Capillary: 127 mg/dL — ABNORMAL HIGH (ref 70–99)

## 2020-09-07 LAB — POCT I-STAT 7, (LYTES, BLD GAS, ICA,H+H)
Acid-Base Excess: 5 mmol/L — ABNORMAL HIGH (ref 0.0–2.0)
Bicarbonate: 29 mmol/L — ABNORMAL HIGH (ref 20.0–28.0)
Calcium, Ion: 1.19 mmol/L (ref 1.15–1.40)
HCT: 29 % — ABNORMAL LOW (ref 39.0–52.0)
Hemoglobin: 9.9 g/dL — ABNORMAL LOW (ref 13.0–17.0)
O2 Saturation: 99 %
Potassium: 3.8 mmol/L (ref 3.5–5.1)
Sodium: 141 mmol/L (ref 135–145)
TCO2: 30 mmol/L (ref 22–32)
pCO2 arterial: 41.3 mmHg (ref 32.0–48.0)
pH, Arterial: 7.454 — ABNORMAL HIGH (ref 7.350–7.450)
pO2, Arterial: 136 mmHg — ABNORMAL HIGH (ref 83.0–108.0)

## 2020-09-07 LAB — HEPARIN LEVEL (UNFRACTIONATED): Heparin Unfractionated: 1.44 IU/mL — ABNORMAL HIGH (ref 0.30–0.70)

## 2020-09-07 LAB — APTT: aPTT: 66 seconds — ABNORMAL HIGH (ref 24–36)

## 2020-09-07 LAB — GLUCOSE, CAPILLARY
Glucose-Capillary: 118 mg/dL — ABNORMAL HIGH (ref 70–99)
Glucose-Capillary: 267 mg/dL — ABNORMAL HIGH (ref 70–99)

## 2020-09-07 SURGERY — RIGHT/LEFT HEART CATH AND CORONARY ANGIOGRAPHY
Anesthesia: LOCAL

## 2020-09-07 MED ORDER — SODIUM CHLORIDE 0.9 % IV SOLN: 250.0000 mL | INTRAVENOUS | Status: DC | PRN

## 2020-09-07 MED ORDER — SODIUM CHLORIDE 0.9% FLUSH: 3.0000 mL | Freq: Two times a day (BID) | INTRAVENOUS | Status: DC

## 2020-09-07 MED ORDER — LIDOCAINE HCL (PF) 1 % IJ SOLN
INTRAMUSCULAR | Status: DC | PRN
  Administered 2020-09-07: 2 mL
  Administered 2020-09-07: 1 mL

## 2020-09-07 MED ORDER — HEPARIN (PORCINE) 25000 UT/250ML-% IV SOLN
1850.0000 [IU]/h | INTRAVENOUS | Status: DC
  Administered 2020-09-07 – 2020-09-08 (×2): 1400 [IU]/h via INTRAVENOUS
  Administered 2020-09-08 – 2020-09-09 (×2): 1750 [IU]/h via INTRAVENOUS
  Administered 2020-09-10 – 2020-09-11 (×2): 1850 [IU]/h via INTRAVENOUS
  Filled 2020-09-07 (×6): qty 250

## 2020-09-07 MED ORDER — MUPIROCIN 2 % EX OINT
1.0000 "application " | TOPICAL_OINTMENT | Freq: Two times a day (BID) | CUTANEOUS | Status: DC
  Administered 2020-09-07 – 2020-09-11 (×8): 1 via NASAL
  Filled 2020-09-07 (×4): qty 22

## 2020-09-07 MED ORDER — SODIUM CHLORIDE 0.9 % IV SOLN: INTRAVENOUS | Status: DC

## 2020-09-07 MED ORDER — IOHEXOL 350 MG/ML SOLN
INTRAVENOUS | Status: DC | PRN
  Administered 2020-09-07: 80 mL

## 2020-09-07 MED ORDER — SODIUM CHLORIDE 0.9% FLUSH: 3.0000 mL | INTRAVENOUS | Status: DC | PRN

## 2020-09-07 MED ORDER — HEPARIN (PORCINE) IN NACL 1000-0.9 UT/500ML-% IV SOLN
INTRAVENOUS | Status: DC | PRN
  Administered 2020-09-07 (×2): 500 mL

## 2020-09-07 MED ORDER — ASPIRIN 81 MG PO CHEW
81.0000 mg | CHEWABLE_TABLET | ORAL | Status: AC
  Administered 2020-09-07: 81 mg via ORAL
  Filled 2020-09-07 (×2): qty 1

## 2020-09-07 MED ORDER — VERAPAMIL HCL 2.5 MG/ML IV SOLN
INTRA_ARTERIAL | Status: DC | PRN
  Administered 2020-09-07: 3 mL via INTRA_ARTERIAL

## 2020-09-07 MED ORDER — AMLODIPINE BESYLATE 5 MG PO TABS
5.0000 mg | ORAL_TABLET | Freq: Every day | ORAL | Status: DC
  Administered 2020-09-07 – 2020-09-08 (×2): 5 mg via ORAL
  Filled 2020-09-07 (×2): qty 1

## 2020-09-07 MED ORDER — HEPARIN SODIUM (PORCINE) 1000 UNIT/ML IJ SOLN
INTRAMUSCULAR | Status: DC | PRN
  Administered 2020-09-07: 6000 [IU] via INTRAVENOUS

## 2020-09-07 SURGICAL SUPPLY — 19 items
BAG SNAP BAND KOVER 36X36 (MISCELLANEOUS) ×1 IMPLANT
CATH BALLN WEDGE 5F 110CM (CATHETERS) ×1 IMPLANT
CATH INFINITI 5 FR JL3.5 (CATHETERS) ×1 IMPLANT
CATH INFINITI JR4 5F (CATHETERS) ×1 IMPLANT
CATH OPTITORQUE TIG 4.0 5F (CATHETERS) ×1 IMPLANT
COVER DOME SNAP 22 D (MISCELLANEOUS) ×1 IMPLANT
DEVICE RAD COMP TR BAND LRG (VASCULAR PRODUCTS) ×1 IMPLANT
GLIDESHEATH SLEND A-KIT 6F 22G (SHEATH) ×1 IMPLANT
GLIDESHEATH SLEND SS 6F .021 (SHEATH) IMPLANT
GUIDEWIRE .025 260CM (WIRE) ×1 IMPLANT
GUIDEWIRE INQWIRE 1.5J.035X260 (WIRE) IMPLANT
INQWIRE 1.5J .035X260CM (WIRE) ×2
KIT HEART LEFT (KITS) ×2 IMPLANT
PACK CARDIAC CATHETERIZATION (CUSTOM PROCEDURE TRAY) ×2 IMPLANT
SHEATH GLIDE SLENDER 4/5FR (SHEATH) ×1 IMPLANT
TRANSDUCER W/STOPCOCK (MISCELLANEOUS) ×2 IMPLANT
TUBING CIL FLEX 10 FLL-RA (TUBING) ×2 IMPLANT
WIRE EMERALD ST .035X150CM (WIRE) ×1 IMPLANT
WIRE HI TORQ VERSACORE-J 145CM (WIRE) ×1 IMPLANT

## 2020-09-07 NOTE — Progress Notes (Signed)
Progress Note  Patient Name: Mason Patton Date of Encounter: 09/07/2020  [facility] Cardiologist: [name], MD   Subjective   No CP; intermittently dyspneic  Inpatient Medications    Scheduled Meds: . aspirin EC  81 mg Oral Daily  . atorvastatin  40 mg Oral QHS  . carvedilol  25 mg Oral BID  . insulin aspart  0-20 Units Subcutaneous TID WC  . insulin aspart  0-5 Units Subcutaneous QHS  . insulin aspart  6 Units Subcutaneous TID WC  . levothyroxine  224 mcg Oral QAC breakfast  . losartan  100 mg Oral Daily  . sodium chloride flush  3 mL Intravenous Q12H  . sodium chloride flush  3 mL Intravenous Q12H   Continuous Infusions: . sodium chloride    . sodium chloride    . sodium chloride 50 mL/hr at 09/07/20 0946  . heparin 1,600 Units/hr (09/07/20 0430)   PRN Meds: sodium chloride, sodium chloride, acetaminophen, ondansetron (ZOFRAN) IV, sodium chloride flush, sodium chloride flush   Vital Signs    Vitals:   09/07/20 0400 09/07/20 0600 09/07/20 0824 09/07/20 0828  BP: (!) 155/80  (!) 180/92 (!) 180/92  Pulse: (!) 49 (!) 55 (!) 45 (!) 57  Resp: 18 13 (!) 21 15  Temp:    98.5 F (36.9 C)  TempSrc:    Oral  SpO2: 97% 97% 99% 100%  Weight:      Height:        Intake/Output Summary (Last 24 hours) at 09/07/2020 1005 Last data filed at 09/06/2020 1138 Gross per 24 hour  Intake --  Output 700 ml  Net -700 ml   Last 3 Weights 09/05/2020 09/05/2020 05/22/2020  Weight (lbs) 255 lb 260 lb 6.4 oz 261 lb  Weight (kg) 115.667 kg 118.117 kg 118.389 kg       Physical Exam   GEN: No acute distress.   Neck: No JVD Cardiac: irregular, 3/6 systolic murmur Respiratory: Clear to auscultation bilaterally. GI: Soft, nontender, non-distended  MS: No edema Neuro:  Nonfocal  Psych: Normal affect   Labs    High Sensitivity Troponin:   Recent Labs  Lab 09/05/20 2141 09/06/20 0043  TROPONINIHS 17 20*      Chemistry Recent Labs  Lab 09/05/20 1651  09/05/20 2141 09/07/20 0214  NA 143 141 139  K 4.4 3.5 3.5  CL 103 103 101  CO2 24 28 28   GLUCOSE 212* 240* 107*  BUN 21 21 18   CREATININE 0.90 0.94 0.83  CALCIUM 9.4 9.2 9.0  PROT  --  6.1*  --   ALBUMIN  --  3.5  --   AST  --  23  --   ALT  --  21  --   ALKPHOS  --  86  --   BILITOT  --  1.1  --   GFRNONAA  --  >60 >60  ANIONGAP  --  10 10     Hematology Recent Labs  Lab 09/05/20 1715 09/05/20 2141 09/07/20 0214  WBC 7.9 7.3 6.5  RBC 4.45 4.26 3.99*  HGB 9.8* 9.2* 8.7*  HCT 32.6* 32.4* 30.2*  MCV 73* 76.1* 75.7*  MCH 22.0* 21.6* 21.8*  MCHC 30.1* 28.4* 28.8*  RDW 16.6* 17.6* 17.5*  PLT 198 191 150    BNP Recent Labs  Lab 09/05/20 1651 09/06/20 0703  BNP  --  803.0*  PROBNP 3,817*  --      Radiology    DG Chest  Port 1 View  Result Date: 09/06/2020 CLINICAL DATA:  Chest pain EXAM: PORTABLE CHEST 1 VIEW COMPARISON:  11/07/2014 FINDINGS: The heart size and mediastinal contours are within normal limits. Both lungs are clear. The visualized skeletal structures are unremarkable. IMPRESSION: No active disease. Electronically Signed   By: Helyn Numbers MD   On: 09/06/2020 04:14   ECHOCARDIOGRAM COMPLETE  Result Date: 09/06/2020    ECHOCARDIOGRAM REPORT   [name] Date of Exam: 09/06/2020 Medical Rec #:  [id number]        Height:       70.0 in Accession #:    [id number]       Weight:       255.0 lb Date of Birth:  1946-06-21        BSA:          2.314 m Patient Age:    75 years         BP:           136/75 mmHg Patient Gender: M                HR:           46 bpm. Exam Location:  Inpatient Procedure: 2D Echo, Cardiac Doppler and Color Doppler Indications:    I42.9 Cardiomyopathy (unspecified)  History:        Patient has prior history of Echocardiogram examinations, most                 recent 11/16/2013. Stroke and Carotid Disease, Aortic Valve                 Disease, Arrythmias:Atrial Fibrillation, Signs/Symptoms:Murmur                 and  Dyspnea; Risk Factors:Hypertension, Diabetes, Dyslipidemia,                 Former Smoker and Sleep Apnea. Thyroid Disease.  Sonographer:    Elmarie Shiley Dance Referring Phys: [id number] CARRIEL T NIPP IMPRESSIONS  1. Left ventricular ejection fraction, by estimation, is 45 to 50%. The left ventricle has mildly decreased function. The left ventricle demonstrates global hypokinesis. Left ventricular diastolic parameters are indeterminate.  2. Right ventricular systolic function is normal. The right ventricular size is normal. Tricuspid regurgitation signal is inadequate for assessing PA pressure.  3. Left atrial size was moderately dilated.  4. Right atrial size was mildly dilated.  5. The mitral valve is normal in structure. Trivial mitral valve regurgitation. No evidence of mitral stenosis. Moderate mitral annular calcification.  6. The aortic valve is tricuspid. Aortic valve regurgitation is not visualized. Severe aortic valve stenosis. Aortic valve area, by VTI measures 0.67 cm. Aortic valve mean gradient measures 35.0 mmHg.  7. Aortic dilatation noted. 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  8. The inferior vena cava is normal in size with <50% respiratory variability, suggesting right atrial pressure of 8 mmHg. FINDINGS  Left Ventricle: Left ventricular ejection fraction, by estimation, is 45 to 50%. The left ventricle has mildly decreased function. The left ventricle demonstrates global hypokinesis. The left ventricular internal cavity size was normal in size. There is  no left ventricular hypertrophy. Left ventricular diastolic parameters are indeterminate. Right Ventricle: The right ventricular size is normal. No increase in right ventricular wall thickness. Right ventricular systolic function is normal. Tricuspid regurgitation signal is inadequate for assessing PA pressure. Left Atrium: Left atrial size was moderately dilated. Right  Atrium: Right atrial size was mildly dilated. Pericardium:  Trivial pericardial effusion is present. Mitral Valve: The mitral valve is normal in structure. Moderate mitral annular calcification. Trivial mitral valve regurgitation. No evidence of mitral valve stenosis. Tricuspid Valve: The tricuspid valve is normal in structure. Tricuspid valve regurgitation is not demonstrated. Aortic Valve: The aortic valve is tricuspid. Aortic valve regurgitation is not visualized. Severe aortic stenosis is present. Aortic valve mean gradient measures 35.0 mmHg. Aortic valve peak gradient measures 53.4 mmHg. Aortic valve area, by VTI measures  0.67 cm. Pulmonic Valve: The pulmonic valve was normal in structure. Pulmonic valve regurgitation is not visualized. Aorta: Aortic dilatation noted. 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 Venous: The inferior vena cava is normal in size with less than 50% respiratory variability, suggesting right atrial pressure of 8 mmHg. IAS/Shunts: No atrial level shunt detected by color flow Doppler.  LEFT VENTRICLE PLAX 2D LVIDd:         5.60 cm LVIDs:         4.20 cm LV PW:         1.40 cm LV IVS:        1.00 cm LVOT diam:     2.30 cm LV SV:         69 LV SV Index:   30 LVOT Area:     4.15 cm  RIGHT VENTRICLE          IVC RV Basal diam:  3.10 cm  IVC diam: 2.00 cm RV Mid diam:    1.40 cm TAPSE (M-mode): 1.9 cm LEFT ATRIUM              Index       RIGHT ATRIUM           Index LA diam:        5.80 cm  2.51 cm/m  RA Area:     25.30 cm LA Vol (A2C):   161.0 ml 69.57 ml/m RA Volume:   82.90 ml  35.82 ml/m LA Vol (A4C):   81.1 ml  35.04 ml/m LA Biplane Vol: 116.0 ml 50.12 ml/m  AORTIC VALVE AV Area (Vmax):    0.72 cm AV Area (Vmean):   0.68 cm AV Area (VTI):     0.67 cm AV Vmax:           365.50 cm/s AV Vmean:          255.500 cm/s AV VTI:            1.027 m AV Peak Grad:      53.4 mmHg AV Mean Grad:      35.0 mmHg LVOT Vmax:         63.20 cm/s LVOT Vmean:        41.550 cm/s LVOT VTI:          0.165 m LVOT/AV VTI ratio: 0.16  AORTA  Ao Root diam: 3.70 cm Ao Asc diam:  3.70 cm MITRAL VALVE MV Area (PHT): 2.24 cm     SHUNTS MV Decel Time: 338 msec     Systemic VTI:  0.16 m MV E velocity: 107.00 cm/s  Systemic Diam: 2.30 cm Dalton Mclean MD Electronically signed by Dalton Mclean MD Signature Date/Time: 09/06/2020/3:19:52 PM    Final     Patient Profile     74 y.o. male with past medical history of permanent atrial fibrillation, aortic stenosis, hypertension, diabetes mellitus, prior CVA, hyperlipidemia, obstructive sleep apnea, anemia admitted with chest pain   and dyspnea and noted to have abnormal nuclear study as an outpatient.  Echocardiogram shows ejection fraction 45 to 50%, biatrial enlargement, severe aortic stenosis with mean gradient 35 mmHg.  Assessment & Plan    1 unstable angina-patient apparently had an abnormal nuclear study at the [place].  Plan had been cardiac catheterization.  We will arrange right and left catheterization today.  The risk and benefits including myocardial infarction, CVA and death discussed and he agrees to proceed.  Continue aspirin, statin, heparin and beta-blocker.  2 aortic stenosis-severe on echocardiogram.  Plan right and left cardiac catheterization as outlined above.  We will likely need AVR.  Await results of cardiac catheterization.  3 hypertension-blood pressure elevated.  Will follow and continue present medications.  Add amlodipine 5 mg daily.  4 microcytic anemia-patient was scheduled for an outpatient colonoscopy which has been delayed.  Will need GI evaluation at some point.  5 permanent atrial fibrillation-continue beta-blocker for rate control.  Apixaban on hold for cardiac catheterization.  Continue IV heparin.  6 acute on chronic diastolic congestive heart failure-May need further diuresis but will delay today prior to catheterization.  7 obstructive sleep apnea-continue CPAP.  For questions or updates, please contact [facility] Please consult [email or web address] for contact  info under        Signed, Olga Millers, MD  09/07/2020, 10:05 AM

## 2020-09-07 NOTE — Progress Notes (Signed)
[facility] consulted for CABG evaluation. °

## 2020-09-07 NOTE — Progress Notes (Addendum)
ANTICOAGULATION CONSULT NOTE - Follow Up Consult  Pharmacy Consult for Heparin Indication: h/o afib, CVA, and CAD  Allergies  Allergen Reactions  . Codeine Nausea And Vomiting  . Demerol Nausea And Vomiting    Patient Measurements: Height: 5\' 10"  (177.8 cm) Weight: 115.7 kg (255 lb) IBW/kg (Calculated) : 73 Heparin Dosing Weight: 98.6 kg  Vital Signs: Temp: 98.5 F (36.9 C) (03/03 0828) Temp Source: Oral (03/03 0828) BP: 149/87 (03/03 1512) Pulse Rate: 61 (03/03 1512)  Labs: Recent Labs    09/05/20 1651 09/05/20 1715 09/05/20 2141 09/06/20 0043 09/06/20 1915 09/07/20 0214  HGB  --  9.8* 9.2*  --   --  8.7*  HCT  --  32.6* 32.4*  --   --  30.2*  PLT  --  198 191  --   --  150  APTT  --   --  39*  --  53* 66*  LABPROT  --   --  16.7*  --   --   --   INR  --   --  1.4*  --   --   --   HEPARINUNFRC  --   --   --   --  1.96* 1.44*  CREATININE 0.90  --  0.94  --   --  0.83  TROPONINIHS  --   --  17 20*  --   --     Estimated Creatinine Clearance: 99.5 mL/min (by C-G formula based on SCr of 0.83 mg/dL).  Assessment: Anticoag: SOB, hx of afib on Eliquis PTA (last dose 3/1), Hgb 8.7. Plts 150. Resume heparin post-cath (sheath out 1525).   Goal of Therapy:  Heparin level 0.3-0.7 units/ml  APTT 66-102  Monitor platelets by anticoagulation protocol: Yes   Plan:  IV heparin (no bolus) at 1400 units/hr to start at 1930 Daily HL, aPTT and CBC   Aitan Rossbach S. 11/07/20, PharmD, BCPS Clinical Staff Pharmacist [email or web address] Merilynn Finland, Reinhard Schack Stillinger 09/07/2020,3:32 PM

## 2020-09-07 NOTE — Interval H&P Note (Signed)
Cath Lab Visit (complete for each Cath Lab visit)  Clinical Evaluation Leading to the Procedure:   ACS: Yes.    Non-ACS:    Anginal Classification: CCS II  Anti-ischemic medical therapy: Minimal Therapy (1 class of medications)  Non-Invasive Test Results: No non-invasive testing performed  Prior CABG: No previous CABG      History and Physical Interval Note:  09/07/2020 2:32 PM  Mason Patton  has presented today for surgery, with the diagnosis of positive stress test - chest pain.  The various methods of treatment have been discussed with the patient and family. After consideration of risks, benefits and other options for treatment, the patient has consented to  Procedure(s): RIGHT/LEFT HEART CATH AND CORONARY ANGIOGRAPHY (N/A) as a surgical intervention.  The patient's history has been reviewed, patient examined, no change in status, stable for surgery.  I have reviewed the patient's chart and labs.  Questions were answered to the patient's satisfaction.     Nanetta Batty

## 2020-09-07 NOTE — Plan of Care (Signed)
  Problem: Education: Goal: Ability to demonstrate management of disease process will improve Outcome: Progressing Goal: Ability to verbalize understanding of medication therapies will improve Outcome: Progressing Goal: Individualized Educational Video(s) Outcome: Progressing   Problem: Activity: Goal: Capacity to carry out activities will improve Outcome: Progressing   Problem: Cardiac: Goal: Ability to achieve and maintain adequate cardiopulmonary perfusion will improve Outcome: Progressing   Problem: Education: Goal: Understanding of CV disease, CV risk reduction, and recovery process will improve Outcome: Progressing Goal: Individualized Educational Video(s) Outcome: Progressing

## 2020-09-07 NOTE — Progress Notes (Signed)
Received report from [place], Charity fundraiser. RN states that patient has 47ml of air remaining in the TR band. Heparin infusing at 1ml/hr. Called pharmacy to confirm infusion time of 1930 for restart and explained upon receiving report heparin was infusing. Pharmacist states to monitor patient for bleeding and will check heparin level in am.

## 2020-09-07 NOTE — Progress Notes (Signed)
ANTICOAGULATION CONSULT NOTE  Pharmacy Consult for heparin Indication: atrial fibrillation  Allergies  Allergen Reactions  . Codeine Nausea And Vomiting  . Demerol Nausea And Vomiting    Patient Measurements: Height: 5\' 10"  (177.8 cm) Weight: 115.7 kg (255 lb) IBW/kg (Calculated) : 73 Heparin Dosing Weight: 98.6kg  Vital Signs: BP: 148/81 (03/03 0000) Pulse Rate: 52 (03/03 0000)  Labs: Recent Labs    09/05/20 1651 09/05/20 1715 09/05/20 2141 09/06/20 0043 09/06/20 1915 09/07/20 0214  HGB  --  9.8* 9.2*  --   --  8.7*  HCT  --  32.6* 32.4*  --   --  30.2*  PLT  --  198 191  --   --  150  APTT  --   --  39*  --  53* 66*  LABPROT  --   --  16.7*  --   --   --   INR  --   --  1.4*  --   --   --   HEPARINUNFRC  --   --   --   --  1.96*  --   CREATININE 0.90  --  0.94  --   --   --   TROPONINIHS  --   --  17 20*  --   --     Estimated Creatinine Clearance: 87.9 mL/min (by C-G formula based on SCr of 0.94 mg/dL).    Assessment: 69 YOM presenting with SOB, hx of afib on Eliquis PTA (last dose 3/1). Pharmacy consulted to start heparin drip while Eliquis on hold for planned cath.  PTT therapeutic (66 sec) on gtt at 1600 units/hr. No bleeding noted.  Goal of Therapy:  Heparin level 0.3-0.7 units/ml aPTT 66-102 seconds Monitor platelets by anticoagulation protocol: Yes   Plan:  Continue heparin IV at 1600 units/hr F/u post-cath for ability to restart Eliquis  66, PharmD, BCPS Please see amion for complete clinical pharmacist phone list 09/07/2020 4:22 AM

## 2020-09-07 NOTE — ED Notes (Signed)
Pt leaving for cath lab 5 with nurse assist transport and on monitor. Report called to 3E29 and given to Lurena Joiner, Charity fundraiser. Pt's belongings transported with pt, wife with pt.

## 2020-09-07 NOTE — H&P (View-Only) (Signed)
Progress Note  Patient Name: Mason Patton Date of Encounter: 09/07/2020  [facility] Cardiologist: [name], MD   Subjective   No CP; intermittently dyspneic  Inpatient Medications    Scheduled Meds: . aspirin EC  81 mg Oral Daily  . atorvastatin  40 mg Oral QHS  . carvedilol  25 mg Oral BID  . insulin aspart  0-20 Units Subcutaneous TID WC  . insulin aspart  0-5 Units Subcutaneous QHS  . insulin aspart  6 Units Subcutaneous TID WC  . levothyroxine  224 mcg Oral QAC breakfast  . losartan  100 mg Oral Daily  . sodium chloride flush  3 mL Intravenous Q12H  . sodium chloride flush  3 mL Intravenous Q12H   Continuous Infusions: . sodium chloride    . sodium chloride    . sodium chloride 50 mL/hr at 09/07/20 0946  . heparin 1,600 Units/hr (09/07/20 0430)   PRN Meds: sodium chloride, sodium chloride, acetaminophen, ondansetron (ZOFRAN) IV, sodium chloride flush, sodium chloride flush   Vital Signs    Vitals:   09/07/20 0400 09/07/20 0600 09/07/20 0824 09/07/20 0828  BP: (!) 155/80  (!) 180/92 (!) 180/92  Pulse: (!) 49 (!) 55 (!) 45 (!) 57  Resp: 18 13 (!) 21 15  Temp:    98.5 F (36.9 C)  TempSrc:    Oral  SpO2: 97% 97% 99% 100%  Weight:      Height:        Intake/Output Summary (Last 24 hours) at 09/07/2020 1005 Last data filed at 09/06/2020 1138 Gross per 24 hour  Intake --  Output 700 ml  Net -700 ml   Last 3 Weights 09/05/2020 09/05/2020 05/22/2020  Weight (lbs) 255 lb 260 lb 6.4 oz 261 lb  Weight (kg) 115.667 kg 118.117 kg 118.389 kg       Physical Exam   GEN: No acute distress.   Neck: No JVD Cardiac: irregular, 3/6 systolic murmur Respiratory: Clear to auscultation bilaterally. GI: Soft, nontender, non-distended  MS: No edema Neuro:  Nonfocal  Psych: Normal affect   Labs    High Sensitivity Troponin:   Recent Labs  Lab 09/05/20 2141 09/06/20 0043  TROPONINIHS 17 20*      Chemistry Recent Labs  Lab 09/05/20 1651  09/05/20 2141 09/07/20 0214  NA 143 141 139  K 4.4 3.5 3.5  CL 103 103 101  CO2 24 28 28   GLUCOSE 212* 240* 107*  BUN 21 21 18   CREATININE 0.90 0.94 0.83  CALCIUM 9.4 9.2 9.0  PROT  --  6.1*  --   ALBUMIN  --  3.5  --   AST  --  23  --   ALT  --  21  --   ALKPHOS  --  86  --   BILITOT  --  1.1  --   GFRNONAA  --  >60 >60  ANIONGAP  --  10 10     Hematology Recent Labs  Lab 09/05/20 1715 09/05/20 2141 09/07/20 0214  WBC 7.9 7.3 6.5  RBC 4.45 4.26 3.99*  HGB 9.8* 9.2* 8.7*  HCT 32.6* 32.4* 30.2*  MCV 73* 76.1* 75.7*  MCH 22.0* 21.6* 21.8*  MCHC 30.1* 28.4* 28.8*  RDW 16.6* 17.6* 17.5*  PLT 198 191 150    BNP Recent Labs  Lab 09/05/20 1651 09/06/20 0703  BNP  --  803.0*  PROBNP 3,817*  --      Radiology    DG Chest  Port 1 View  Result Date: 09/06/2020 CLINICAL DATA:  Chest pain EXAM: PORTABLE CHEST 1 VIEW COMPARISON:  11/07/2014 FINDINGS: The heart size and mediastinal contours are within normal limits. Both lungs are clear. The visualized skeletal structures are unremarkable. IMPRESSION: No active disease. Electronically Signed   By: Helyn Numbers MD   On: 09/06/2020 04:14   ECHOCARDIOGRAM COMPLETE  Result Date: 09/06/2020    ECHOCARDIOGRAM REPORT   [name] Date of Exam: 09/06/2020 Medical Rec #:  [id number]        Height:       70.0 in Accession #:    [id number]       Weight:       255.0 lb Date of Birth:  1946-06-21        BSA:          2.314 m Patient Age:    74 years         BP:           136/75 mmHg Patient Gender: M                HR:           46 bpm. Exam Location:  Inpatient Procedure: 2D Echo, Cardiac Doppler and Color Doppler Indications:    I42.9 Cardiomyopathy (unspecified)  History:        Patient has prior history of Echocardiogram examinations, most                 recent 11/16/2013. Stroke and Carotid Disease, Aortic Valve                 Disease, Arrythmias:Atrial Fibrillation, Signs/Symptoms:Murmur                 and  Dyspnea; Risk Factors:Hypertension, Diabetes, Dyslipidemia,                 Former Smoker and Sleep Apnea. Thyroid Disease.  Sonographer:    Elmarie Shiley Dance Referring Phys: [id number] CARRIEL T NIPP IMPRESSIONS  1. Left ventricular ejection fraction, by estimation, is 45 to 50%. The left ventricle has mildly decreased function. The left ventricle demonstrates global hypokinesis. Left ventricular diastolic parameters are indeterminate.  2. Right ventricular systolic function is normal. The right ventricular size is normal. Tricuspid regurgitation signal is inadequate for assessing PA pressure.  3. Left atrial size was moderately dilated.  4. Right atrial size was mildly dilated.  5. The mitral valve is normal in structure. Trivial mitral valve regurgitation. No evidence of mitral stenosis. Moderate mitral annular calcification.  6. The aortic valve is tricuspid. Aortic valve regurgitation is not visualized. Severe aortic valve stenosis. Aortic valve area, by VTI measures 0.67 cm. Aortic valve mean gradient measures 35.0 mmHg.  7. Aortic dilatation noted. 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  8. The inferior vena cava is normal in size with <50% respiratory variability, suggesting right atrial pressure of 8 mmHg. FINDINGS  Left Ventricle: Left ventricular ejection fraction, by estimation, is 45 to 50%. The left ventricle has mildly decreased function. The left ventricle demonstrates global hypokinesis. The left ventricular internal cavity size was normal in size. There is  no left ventricular hypertrophy. Left ventricular diastolic parameters are indeterminate. Right Ventricle: The right ventricular size is normal. No increase in right ventricular wall thickness. Right ventricular systolic function is normal. Tricuspid regurgitation signal is inadequate for assessing PA pressure. Left Atrium: Left atrial size was moderately dilated. Right  Atrium: Right atrial size was mildly dilated. Pericardium:  Trivial pericardial effusion is present. Mitral Valve: The mitral valve is normal in structure. Moderate mitral annular calcification. Trivial mitral valve regurgitation. No evidence of mitral valve stenosis. Tricuspid Valve: The tricuspid valve is normal in structure. Tricuspid valve regurgitation is not demonstrated. Aortic Valve: The aortic valve is tricuspid. Aortic valve regurgitation is not visualized. Severe aortic stenosis is present. Aortic valve mean gradient measures 35.0 mmHg. Aortic valve peak gradient measures 53.4 mmHg. Aortic valve area, by VTI measures  0.67 cm. Pulmonic Valve: The pulmonic valve was normal in structure. Pulmonic valve regurgitation is not visualized. Aorta: Aortic dilatation noted. 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 Venous: The inferior vena cava is normal in size with less than 50% respiratory variability, suggesting right atrial pressure of 8 mmHg. IAS/Shunts: No atrial level shunt detected by color flow Doppler.  LEFT VENTRICLE PLAX 2D LVIDd:         5.60 cm LVIDs:         4.20 cm LV PW:         1.40 cm LV IVS:        1.00 cm LVOT diam:     2.30 cm LV SV:         69 LV SV Index:   30 LVOT Area:     4.15 cm  RIGHT VENTRICLE          IVC RV Basal diam:  3.10 cm  IVC diam: 2.00 cm RV Mid diam:    1.40 cm TAPSE (M-mode): 1.9 cm LEFT ATRIUM              Index       RIGHT ATRIUM           Index LA diam:        5.80 cm  2.51 cm/m  RA Area:     25.30 cm LA Vol (A2C):   161.0 ml 69.57 ml/m RA Volume:   82.90 ml  35.82 ml/m LA Vol (A4C):   81.1 ml  35.04 ml/m LA Biplane Vol: 116.0 ml 50.12 ml/m  AORTIC VALVE AV Area (Vmax):    0.72 cm AV Area (Vmean):   0.68 cm AV Area (VTI):     0.67 cm AV Vmax:           365.50 cm/s AV Vmean:          255.500 cm/s AV VTI:            1.027 m AV Peak Grad:      53.4 mmHg AV Mean Grad:      35.0 mmHg LVOT Vmax:         63.20 cm/s LVOT Vmean:        41.550 cm/s LVOT VTI:          0.165 m LVOT/AV VTI ratio: 0.16  AORTA  Ao Root diam: 3.70 cm Ao Asc diam:  3.70 cm MITRAL VALVE MV Area (PHT): 2.24 cm     SHUNTS MV Decel Time: 338 msec     Systemic VTI:  0.16 m MV E velocity: 107.00 cm/s  Systemic Diam: 2.30 cm Marca Ancona MD Electronically signed by Marca Ancona MD Signature Date/Time: 09/06/2020/3:19:52 PM    Final     Patient Profile     75 y.o. male with past medical history of permanent atrial fibrillation, aortic stenosis, hypertension, diabetes mellitus, prior CVA, hyperlipidemia, obstructive sleep apnea, anemia admitted with chest pain  and dyspnea and noted to have abnormal nuclear study as an outpatient.  Echocardiogram shows ejection fraction 45 to 50%, biatrial enlargement, severe aortic stenosis with mean gradient 35 mmHg.  Assessment & Plan    1 unstable angina-patient apparently had an abnormal nuclear study at the [place].  Plan had been cardiac catheterization.  We will arrange right and left catheterization today.  The risk and benefits including myocardial infarction, CVA and death discussed and he agrees to proceed.  Continue aspirin, statin, heparin and beta-blocker.  2 aortic stenosis-severe on echocardiogram.  Plan right and left cardiac catheterization as outlined above.  We will likely need AVR.  Await results of cardiac catheterization.  3 hypertension-blood pressure elevated.  Will follow and continue present medications.  Add amlodipine 5 mg daily.  4 microcytic anemia-patient was scheduled for an outpatient colonoscopy which has been delayed.  Will need GI evaluation at some point.  5 permanent atrial fibrillation-continue beta-blocker for rate control.  Apixaban on hold for cardiac catheterization.  Continue IV heparin.  6 acute on chronic diastolic congestive heart failure-May need further diuresis but will delay today prior to catheterization.  7 obstructive sleep apnea-continue CPAP.  For questions or updates, please contact [facility] Please consult [email or web address] for contact  info under        Signed, Olga Millers, MD  09/07/2020, 10:05 AM

## 2020-09-08 ENCOUNTER — Inpatient Hospital Stay (HOSPITAL_COMMUNITY): Payer: No Typology Code available for payment source

## 2020-09-08 ENCOUNTER — Encounter (HOSPITAL_COMMUNITY): Payer: Self-pay | Admitting: Cardiovascular Disease

## 2020-09-08 DIAGNOSIS — Z01810 Encounter for preprocedural cardiovascular examination: Secondary | ICD-10-CM

## 2020-09-08 DIAGNOSIS — I2510 Atherosclerotic heart disease of native coronary artery without angina pectoris: Secondary | ICD-10-CM

## 2020-09-08 DIAGNOSIS — I200 Unstable angina: Secondary | ICD-10-CM | POA: Diagnosis not present

## 2020-09-08 DIAGNOSIS — I350 Nonrheumatic aortic (valve) stenosis: Secondary | ICD-10-CM

## 2020-09-08 LAB — CBC
HCT: 28.6 % — ABNORMAL LOW (ref 39.0–52.0)
Hemoglobin: 8.3 g/dL — ABNORMAL LOW (ref 13.0–17.0)
MCH: 21.6 pg — ABNORMAL LOW (ref 26.0–34.0)
MCHC: 29 g/dL — ABNORMAL LOW (ref 30.0–36.0)
MCV: 74.3 fL — ABNORMAL LOW (ref 80.0–100.0)
Platelets: 139 10*3/uL — ABNORMAL LOW (ref 150–400)
RBC: 3.85 MIL/uL — ABNORMAL LOW (ref 4.22–5.81)
RDW: 17.5 % — ABNORMAL HIGH (ref 11.5–15.5)
WBC: 5.9 10*3/uL (ref 4.0–10.5)
nRBC: 0 % (ref 0.0–0.2)

## 2020-09-08 LAB — BASIC METABOLIC PANEL
Anion gap: 11 (ref 5–15)
BUN: 18 mg/dL (ref 8–23)
CO2: 24 mmol/L (ref 22–32)
Calcium: 8.7 mg/dL — ABNORMAL LOW (ref 8.9–10.3)
Chloride: 104 mmol/L (ref 98–111)
Creatinine, Ser: 0.78 mg/dL (ref 0.61–1.24)
GFR, Estimated: >60 mL/min (ref >60)
Glucose, Bld: 122 mg/dL — ABNORMAL HIGH (ref 70–99)
Potassium: 3.6 mmol/L (ref 3.5–5.1)
Sodium: 139 mmol/L (ref 135–145)

## 2020-09-08 LAB — GLUCOSE, CAPILLARY
Glucose-Capillary: 115 mg/dL — ABNORMAL HIGH (ref 70–99)
Glucose-Capillary: 128 mg/dL — ABNORMAL HIGH (ref 70–99)
Glucose-Capillary: 198 mg/dL — ABNORMAL HIGH (ref 70–99)
Glucose-Capillary: 209 mg/dL — ABNORMAL HIGH (ref 70–99)
Glucose-Capillary: 186 mg/dL — ABNORMAL HIGH (ref 70–99)

## 2020-09-08 LAB — PULMONARY FUNCTION TEST
FEF 25-75 Pre: 1.35 L/sec
FEF2575-%Pred-Pre: 59 %
FEV1-%Pred-Pre: 66 %
FEV1-Pre: 2.06 L
FEV1FVC-%Pred-Pre: 97 %
FEV6-%Pred-Pre: 71 %
FEV6-Pre: 2.87 L
FEV6FVC-%Pred-Pre: 105 %
FVC-%Pred-Pre: 67 %
FVC-Pre: 2.9 L
Pre FEV1/FVC ratio: 71 %
Pre FEV6/FVC Ratio: 99 %

## 2020-09-08 LAB — APTT
aPTT: 64 seconds — ABNORMAL HIGH (ref 24–36)
aPTT: 59 seconds — ABNORMAL HIGH (ref 24–36)
aPTT: 63 seconds — ABNORMAL HIGH (ref 24–36)

## 2020-09-08 LAB — HEPARIN LEVEL (UNFRACTIONATED): Heparin Unfractionated: 0.6 IU/mL (ref 0.30–0.70)

## 2020-09-08 MED ORDER — PHENYLEPHRINE HCL-NACL 20-0.9 MG/250ML-% IV SOLN
30.0000 ug/min | INTRAVENOUS | Status: AC
  Administered 2020-09-11: 15 ug/min via INTRAVENOUS
  Filled 2020-09-08: qty 250

## 2020-09-08 MED ORDER — SODIUM CHLORIDE 0.9 % IV SOLN
1.5000 g | INTRAVENOUS | Status: AC
  Administered 2020-09-11: 1.5 g via INTRAVENOUS
  Filled 2020-09-08: qty 1.5

## 2020-09-08 MED ORDER — TRANEXAMIC ACID (OHS) PUMP PRIME SOLUTION
2.0000 mg/kg | INTRAVENOUS | Status: DC
  Filled 2020-09-08: qty 2.36

## 2020-09-08 MED ORDER — NOREPINEPHRINE 4 MG/250ML-% IV SOLN
0.0000 ug/min | INTRAVENOUS | Status: DC
  Filled 2020-09-08: qty 250

## 2020-09-08 MED ORDER — SODIUM CHLORIDE 0.9 % IV SOLN
INTRAVENOUS | Status: DC
  Filled 2020-09-08: qty 30

## 2020-09-08 MED ORDER — DEXMEDETOMIDINE HCL IN NACL 400 MCG/100ML IV SOLN
0.1000 ug/kg/h | INTRAVENOUS | Status: AC
  Administered 2020-09-11: .5 ug/kg/h via INTRAVENOUS
  Filled 2020-09-08: qty 100

## 2020-09-08 MED ORDER — SODIUM CHLORIDE 0.9 % IV SOLN
750.0000 mg | INTRAVENOUS | Status: AC
  Administered 2020-09-11: 750 mg via INTRAVENOUS
  Filled 2020-09-08: qty 750

## 2020-09-08 MED ORDER — MILRINONE LACTATE IN DEXTROSE 20-5 MG/100ML-% IV SOLN
0.3000 ug/kg/min | INTRAVENOUS | Status: DC
  Filled 2020-09-08: qty 100

## 2020-09-08 MED ORDER — POTASSIUM CHLORIDE 2 MEQ/ML IV SOLN
80.0000 meq | INTRAVENOUS | Status: DC
  Filled 2020-09-08: qty 40

## 2020-09-08 MED ORDER — EPINEPHRINE HCL 5 MG/250ML IV SOLN IN NS
0.0000 ug/min | INTRAVENOUS | Status: DC
  Filled 2020-09-08: qty 250

## 2020-09-08 MED ORDER — TRANEXAMIC ACID (OHS) BOLUS VIA INFUSION
15.0000 mg/kg | INTRAVENOUS | Status: AC
  Administered 2020-09-11: 1770 mg via INTRAVENOUS
  Filled 2020-09-08: qty 1770

## 2020-09-08 MED ORDER — INSULIN REGULAR(HUMAN) IN NACL 100-0.9 UT/100ML-% IV SOLN
INTRAVENOUS | Status: AC
  Administered 2020-09-11: 5 [IU]/h via INTRAVENOUS
  Filled 2020-09-08: qty 100

## 2020-09-08 MED ORDER — MAGNESIUM SULFATE 50 % IJ SOLN
40.0000 meq | INTRAMUSCULAR | Status: DC
  Filled 2020-09-08: qty 9.85

## 2020-09-08 MED ORDER — NITROGLYCERIN IN D5W 200-5 MCG/ML-% IV SOLN
2.0000 ug/min | INTRAVENOUS | Status: DC
  Filled 2020-09-08: qty 250

## 2020-09-08 MED ORDER — FUROSEMIDE 10 MG/ML IJ SOLN
40.0000 mg | Freq: Two times a day (BID) | INTRAMUSCULAR | Status: DC
  Administered 2020-09-08 – 2020-09-10 (×5): 40 mg via INTRAVENOUS
  Filled 2020-09-08 (×5): qty 4

## 2020-09-08 MED ORDER — TRANEXAMIC ACID 1000 MG/10ML IV SOLN
1.5000 mg/kg/h | INTRAVENOUS | Status: AC
  Administered 2020-09-11: 1.5 mg/kg/h via INTRAVENOUS
  Filled 2020-09-08: qty 25

## 2020-09-08 MED ORDER — CARVEDILOL 12.5 MG PO TABS
12.5000 mg | ORAL_TABLET | Freq: Two times a day (BID) | ORAL | Status: DC
Start: 2020-09-08 — End: 2020-09-09
  Filled 2020-09-08: qty 1

## 2020-09-08 MED ORDER — PLASMA-LYTE 148 IV SOLN
INTRAVENOUS | Status: DC
  Filled 2020-09-08: qty 2.5

## 2020-09-08 NOTE — Progress Notes (Signed)
Pre-CABG testing has been completed. Preliminary results can be found in CV Proc through chart review.   09/08/20 4:12 PM Olen Cordial RVT

## 2020-09-08 NOTE — H&P (View-Only) (Signed)
[address]       [place] [address]             [phone]      Cardiothoracic Surgery Consultation  Reason for Consult: Severe multivessel coronary disease and severe aortic stenosis Referring Physician: Dr. Lenna Sciara. Mason Patton is an 75 y.o. male.  HPI:   The patient is a 75 year old gentleman with history of type 2 diabetes, hypertension, hyperlipidemia, hypothyroidism, OSA on CPAP, chronic atrial fibrillation on anticoagulation, 50-pack-year smoking history with ongoing 1/2 pack/day smoking, and known 50 to 69% right internal carotid artery stenosis by ultrasound in 10/2019 who presents with a 2 to 53-monthhistory of progressive exertional fatigue, substernal chest pain and shortness of breath as well as lower extremity edema.  It has gotten to the point where he cannot do any activity and spends most of his day sitting in the house watching TV and reading.  He has been having chest discomfort every day.  [facility] with worsening chest discomfort and shortness of breath.  2D echocardiogram on 09/06/2020 showed left ventricular ejection fraction of 45 to 50% with mild left ventricular global hypokinesis.  The aortic valve is trileaflet with severe aortic stenosis.  The aortic valve area by VTI 0.67 cm.  The mean gradient was 35 mmHg.  Right ventricular systolic function was normal.  There was trivial mitral regurgitation.  He underwent cardiac catheterization yesterday showing severe two-vessel coronary disease with diffusely calcified coronaries.  The proximal LAD had an 80% hazy stenosis.  The RCA had 80% proximal and 80% mid stenoses.  PA pressure was 56/25 with a mean of 35.  Mean pulmonary wedge pressure was 31 with an LVEDP of 25.  The mean gradient across the aortic valve was 25 mmHg.  Cardiac index was 2.7.  The patient lives with his wife.  He sees his dentist every 6  months and has had no dental problems.  Past Medical History:  Diagnosis Date  . Aortic stenosis    09/30/19 echo ([facility], Cardiologist Dr. LGeraldo Pitter: Mild-moderate AS, MaxVel 308 cm/s, MeanPG 22 mmHg, AVA 1.6 cm2  . Arthritis   . Atrial fibrillation ([place]   . Carotid artery stenosis    11/03/19 [place]([facility]): 50-69% RICA stenosis  . Cholecystitis with cholelithiasis   . Eczema   . Emphysema of lung ([place]   . Gallstones and inflammation of gallbladder without obstruction   . Heart murmur   . History of nuclear stress test 02/23/2010   dipyridamole; normal pattern of perfusion in all regions; normal, low risk study   . Hyperlipidemia   . Hypertension   . 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 Meningoencephalitis   . Obesity   . OSA on CPAP    last sleep study >8 yrs  . Pneumonia    "twice" (05/19/2013)  . Shortness of breath   . Stroke ([facility]    "they said I had a minor one when I had menigitis" (05/19/2013)  . Thyroid disease   . Tobacco abuse    quit 11/08/2012  . Type II diabetes mellitus ([facility]   . Umbilical hernia     Past Surgical History:  Procedure Laterality Date  . CHOLECYSTECTOMY  09/09/2011   Procedure: LAPAROSCOPIC CHOLECYSTECTOMY WITH INTRAOPERATIVE CHOLANGIOGRAM;  Surgeon: JBelva Crome MD;  [facility]  Service: General;  Laterality: N/A;  . HERNIA REPAIR  04/2006   UHR  . JOINT REPLACEMENT    . KNEE ARTHROSCOPY Right    "w2 times" (05/19/2013)  . RIGHT/LEFT HEART CATH AND CORONARY ANGIOGRAPHY N/A 09/07/2020   Procedure: RIGHT/LEFT HEART CATH AND CORONARY ANGIOGRAPHY;  Surgeon: Lorretta Harp, MD;  Location: [place] [facility];  Service: Cardiovascular;  Laterality: N/A;  . TONSILLECTOMY    . TOTAL KNEE ARTHROPLASTY Left 05/19/2013  . TOTAL KNEE ARTHROPLASTY Left 05/19/2013   Procedure: TOTAL KNEE ARTHROPLASTY;  Surgeon: Newt Minion, MD;  Location: [place];  Service: Orthopedics;  Laterality: Left;   Left Total Knee Arthroplasty  . TOTAL KNEE ARTHROPLASTY Right 11/24/2019  . TOTAL KNEE ARTHROPLASTY Right 11/24/2019   Procedure: RIGHT TOTAL KNEE ARTHROPLASTY;  Surgeon: Newt Minion, MD;  Location: [facility];  Service: Orthopedics;  Laterality: Right;  . TRANSTHORACIC ECHOCARDIOGRAM  03/28/2005   EF=>55%, mod conc LVH; LA mod dilated & borderline RA enlargement; mild TR; mild pulm valve regurg    Family History  Problem Relation Age of Onset  . Cancer Maternal Aunt        colon  . Cancer Maternal Aunt        lung  . Thyroid disease Mother   . Hyperlipidemia Father   . Diabetes Maternal Grandmother   . Asthma Child   . Diabetes type I Child     Social History:  reports that he has been smoking cigarettes. He has a 25.00 pack-year smoking history. He has never used smokeless tobacco. He reports that he does not drink alcohol and does not use drugs.  Allergies:  Allergies  Allergen Reactions  . Codeine Nausea And Vomiting  . Demerol Nausea And Vomiting    Medications:  I have reviewed the patient's current medications. Prior to Admission:  Medications Prior to Admission  Medication Sig Dispense Refill Last Dose  . apixaban (ELIQUIS) 5 MG TABS tablet Take 5 mg by mouth 2 (two) times daily.   09/05/2020 at 10 am  . atorvastatin (LIPITOR) 40 MG tablet Take 40 mg by mouth at bedtime. Reported on 01/11/2016   09/05/2020 at Unknown time  . Blood Glucose Monitoring Suppl (ONETOUCH VERIO FLEX SYSTEM) W/DEVICE KIT 1 each by Does not apply route daily. Dx: E11.9 1 kit 0 09/05/2020 at Unknown time  . carvedilol (COREG) 25 MG tablet Take 1 tablet by mouth 2 (two) times daily.   09/05/2020 at 7.30 pm  . cholecalciferol (VITAMIN D3) 25 MCG (1000 UNIT) tablet Take 1,000 Units by mouth daily.   09/05/2020 at Unknown time  . ferrous sulfate 325 (65 FE) MG tablet Take 1 tablet by mouth 3 (three) times a week.   09/04/2020 at Unknown time  . furosemide (LASIX) 20 MG tablet Take 3 tablets (60 mg total) by mouth  in the morning and at bedtime. (Patient taking differently: Take 60 mg by mouth daily.) 270 tablet 3 09/05/2020 at Unknown time  . glipiZIDE (GLUCOTROL) 10 MG tablet Take 10 mg by mouth 2 (two) times daily before a meal.   09/05/2020 at Unknown time  . glucose blood (ONETOUCH VERIO) test strip Use to test blood sugar 2-3 times daily as instructed. Dx: E11.9 200 each 3 09/05/2020 at Unknown time  . levothyroxine (SYNTHROID) 112 MCG tablet Take 224 mcg by mouth daily before breakfast.   09/05/2020 at Unknown time  . losartan (COZAAR) 100 MG tablet Take 100 mg by mouth daily.   09/05/2020 at  Unknown time  . metFORMIN (GLUCOPHAGE-XR) 500 MG 24 hr tablet TAKE 4 TABLETS BY MOUTH  DAILY WITH DINNER (Patient taking differently: Take 2,000 mg by mouth 2 (two) times daily.) 360 tablet 3 09/05/2020 at Unknown time  . Multiple Vitamins-Minerals (MULTIVITAMIN WITH MINERALS) tablet Take 1 tablet by mouth daily.   09/05/2020 at Unknown time  . Multiple Vitamins-Minerals (PRESERVISION AREDS 2 PO) Take 1 capsule by mouth 2 (two) times daily.   09/05/2020 at Unknown time  . Multiple Vitamins-Minerals (PRESERVISION AREDS 2+MULTI VIT PO) Take 1 capsule by mouth in the morning and at bedtime.   09/05/2020 at Unknown time  . nitroGLYCERIN (NITROSTAT) 0.4 MG SL tablet Place 0.4 mg under the tongue every 5 (five) minutes as needed for chest pain.   Past Week at Unknown time  . omeprazole (PRILOSEC) 20 MG capsule Take 1 capsule by mouth daily.   09/05/2020 at Unknown time  . ONETOUCH DELICA LANCETS FINE MISC Use to test blood sugar 2-3 times daily as instructed. Dx: E11.9 200 each 3 09/05/2020 at Unknown time  . VENTOLIN HFA 108 (90 Base) MCG/ACT inhaler Inhale 1-2 puffs into the lungs every 4 (four) hours as needed for wheezing or shortness of breath.    last year at Unknown time  . vitamin B-12 (CYANOCOBALAMIN) 500 MCG tablet Take 1 tablet by mouth 2 (two) times daily.   09/05/2020 at Unknown time  . isosorbide mononitrate (IMDUR) 30 MG 24 hr  tablet Take 1 tablet (30 mg total) by mouth daily. 90 tablet 3 Unknown at Unknown time   Scheduled: . aspirin EC  81 mg Oral Daily  . atorvastatin  40 mg Oral QHS  . carvedilol  12.5 mg Oral BID  . [START ON 09/11/2020] epinephrine  0-10 mcg/min Intravenous To OR  . furosemide  40 mg Intravenous BID  . [START ON 09/11/2020] heparin-papaverine-plasmalyte irrigation   Irrigation To OR  . insulin aspart  0-20 Units Subcutaneous TID WC  . insulin aspart  0-5 Units Subcutaneous QHS  . insulin aspart  6 Units Subcutaneous TID WC  . [START ON 09/11/2020] insulin   Intravenous To OR  . levothyroxine  224 mcg Oral QAC breakfast  . losartan  100 mg Oral Daily  . [START ON 09/11/2020] magnesium sulfate  40 mEq Other To OR  . mupirocin ointment  1 application Nasal BID  . [START ON 09/11/2020] phenylephrine  30-200 mcg/min Intravenous To OR  . [START ON 09/11/2020] potassium chloride  80 mEq Other To OR  . sodium chloride flush  3 mL Intravenous Q12H  . [START ON 09/11/2020] tranexamic acid  15 mg/kg Intravenous To OR  . [START ON 09/11/2020] tranexamic acid  2 mg/kg Intracatheter To OR   Continuous: . sodium chloride    . [START ON 09/11/2020] cefUROXime (ZINACEF)  IV    . [START ON 09/11/2020] cefUROXime (ZINACEF)  IV    . [START ON 09/11/2020] dexmedetomidine    . [START ON 09/11/2020] heparin 30,000 units/NS 1000 mL solution for CELLSAVER    . heparin 1,500 Units/hr (09/08/20 0552)  . [START ON 09/11/2020] milrinone    . [START ON 09/11/2020] nitroGLYCERIN    . [START ON 09/11/2020] norepinephrine    . [START ON 09/11/2020] tranexamic acid (CYKLOKAPRON) infusion ([facility])     ZPH:XTAVWP chloride, acetaminophen, ondansetron (ZOFRAN) IV, sodium chloride flush Anti-infectives (From admission, onward)   Start     Dose/Rate Route Frequency Ordered Stop   09/11/20 0400  cefUROXime (ZINACEF) 1.5 g in  sodium chloride 0.9 % 100 mL IVPB        1.5 g 200 mL/hr over 30 Minutes Intravenous To Surgery 09/08/20 1420 09/12/20 0400    09/11/20 0400  cefUROXime (ZINACEF) 750 mg in sodium chloride 0.9 % 100 mL IVPB        750 mg 200 mL/hr over 30 Minutes Intravenous To Surgery 09/08/20 1420 09/12/20 0400      Results for orders placed or performed during the hospital encounter of 09/05/20 (from the past 48 hour(s))  CBG monitoring, ED     Status: Abnormal   Collection Time: 09/06/20  5:58 PM  Result Value Ref Range   Glucose-Capillary 149 (H) 70 - 99 mg/dL    Comment: Glucose reference range applies only to samples taken after fasting for at least 8 hours.  APTT     Status: Abnormal   Collection Time: 09/06/20  7:15 PM  Result Value Ref Range   aPTT 53 (H) 24 - 36 seconds    Comment:        IF BASELINE aPTT IS ELEVATED, SUGGEST PATIENT RISK ASSESSMENT BE USED TO DETERMINE APPROPRIATE ANTICOAGULANT THERAPY. Performed at [facility], [place] [address] [place], [place] [address]   Heparin level (unfractionated)     Status: Abnormal   Collection Time: 09/06/20  7:15 PM  Result Value Ref Range   Heparin Unfractionated 1.96 (H) 0.30 - 0.70 IU/mL    Comment: RESULTS CONFIRMED BY MANUAL DILUTION (NOTE) If heparin results are below expected values, and patient dosage has  been confirmed, suggest follow up testing of antithrombin III levels. Performed at [facility], [place] [address], [place], [place] [address]   CBG monitoring, ED     Status: Abnormal   Collection Time: 09/06/20 11:09 PM  Result Value Ref Range   Glucose-Capillary 114 (H) 70 - 99 mg/dL    Comment: Glucose reference range applies only to samples taken after fasting for at least 8 hours.  Basic metabolic panel     Status: Abnormal   Collection Time: 09/07/20  2:14 AM  Result Value Ref Range   Sodium 139 135 - 145 mmol/L   Potassium 3.5 3.5 - 5.1 mmol/L   Chloride 101 98 - 111 mmol/L   CO2 28 22 - 32 mmol/L   Glucose, Bld 107 (H) 70 - 99 mg/dL    Comment: Glucose reference range applies only to samples taken after fasting for at least 8  hours.   BUN 18 8 - 23 mg/dL   Creatinine, Ser 0.83 0.61 - 1.24 mg/dL   Calcium 9.0 8.9 - 10.3 mg/dL   GFR, Estimated >60 >60 mL/min    Comment: (NOTE) Calculated using the CKD-EPI Creatinine Equation (2021)    Anion gap 10 5 - 15    Comment: Performed at [facility] [address] [place], [place] [address]  Heparin level (unfractionated)     Status: Abnormal   Collection Time: 09/07/20  2:14 AM  Result Value Ref Range   Heparin Unfractionated 1.44 (H) 0.30 - 0.70 IU/mL    Comment: RESULTS CONFIRMED BY MANUAL DILUTION (NOTE) If heparin results are below expected values, and patient dosage has  been confirmed, suggest follow up testing of antithrombin III levels. Performed at [facility], [place] [address], [place], [place] [address]   CBC     Status: Abnormal   Collection Time: 09/07/20  2:14 AM  Result Value Ref Range   WBC 6.5 4.0 -  10.5 K/uL   RBC 3.99 (L) 4.22 - 5.81 MIL/uL   Hemoglobin 8.7 (L) 13.0 - 17.0 g/dL    Comment: Reticulocyte Hemoglobin testing may be clinically indicated, consider ordering this additional test [id number]    HCT 30.2 (L) 39.0 - 52.0 %   MCV 75.7 (L) 80.0 - 100.0 fL   MCH 21.8 (L) 26.0 - 34.0 pg   MCHC 28.8 (L) 30.0 - 36.0 g/dL   RDW 17.5 (H) 11.5 - 15.5 %   Platelets 150 150 - 400 K/uL    Comment: REPEATED TO VERIFY   nRBC 0.0 0.0 - 0.2 %    Comment: Performed at [facility], [place] [address], [place], [place] [address]  APTT     Status: Abnormal   Collection Time: 09/07/20  2:14 AM  Result Value Ref Range   aPTT 66 (H) 24 - 36 seconds    Comment:        IF BASELINE aPTT IS ELEVATED, SUGGEST PATIENT RISK ASSESSMENT BE USED TO DETERMINE APPROPRIATE ANTICOAGULANT THERAPY. Performed at [facility], [place] [address], [place], [place] [address]   CBG monitoring, ED     Status: Abnormal   Collection Time: 09/07/20  8:26 AM  Result Value Ref Range   Glucose-Capillary 105 (H) 70 - 99 mg/dL    Comment: Glucose  reference range applies only to samples taken after fasting for at least 8 hours.  CBG monitoring, ED     Status: Abnormal   Collection Time: 09/07/20 11:55 AM  Result Value Ref Range   Glucose-Capillary 127 (H) 70 - 99 mg/dL    Comment: Glucose reference range applies only to samples taken after fasting for at least 8 hours.   Comment 1 Notify RN    Comment 2 Document in Chart   POCT I-Stat EG7     Status: Abnormal   Collection Time: 09/07/20  2:56 PM  Result Value Ref Range   pH, Ven 7.404 7.250 - 7.430   pCO2, Ven 47.6 44.0 - 60.0 mmHg   pO2, Ven 30.0 (LL) 32.0 - 45.0 mmHg   Bicarbonate 29.7 (H) 20.0 - 28.0 mmol/L   TCO2 31 22 - 32 mmol/L   O2 Saturation 56.0 %   Acid-Base Excess 4.0 (H) 0.0 - 2.0 mmol/L   Sodium 142 135 - 145 mmol/L   Potassium 3.7 3.5 - 5.1 mmol/L   Calcium, Ion 1.22 1.15 - 1.40 mmol/L   HCT 29.0 (L) 39.0 - 52.0 %   Hemoglobin 9.9 (L) 13.0 - 17.0 g/dL   Sample type MIXED VENOUS SAMPLE   POCT I-Stat EG7     Status: Abnormal   Collection Time: 09/07/20  2:57 PM  Result Value Ref Range   pH, Ven 7.406 7.250 - 7.430   pCO2, Ven 47.8 44.0 - 60.0 mmHg   pO2, Ven 30.0 (LL) 32.0 - 45.0 mmHg   Bicarbonate 30.0 (H) 20.0 - 28.0 mmol/L   TCO2 31 22 - 32 mmol/L   O2 Saturation 57.0 %   Acid-Base Excess 5.0 (H) 0.0 - 2.0 mmol/L   Sodium 142 135 - 145 mmol/L   Potassium 3.8 3.5 - 5.1 mmol/L   Calcium, Ion 1.25 1.15 - 1.40 mmol/L   HCT 30.0 (L) 39.0 - 52.0 %   Hemoglobin 10.2 (L) 13.0 - 17.0 g/dL   Sample type MIXED VENOUS SAMPLE   I-STAT 7, (LYTES, BLD GAS, ICA, H+H)     Status: Abnormal   Collection Time: 09/07/20  3:01  PM  Result Value Ref Range   pH, Arterial 7.454 (H) 7.350 - 7.450   pCO2 arterial 41.3 32.0 - 48.0 mmHg   pO2, Arterial 136 (H) 83.0 - 108.0 mmHg   Bicarbonate 29.0 (H) 20.0 - 28.0 mmol/L   TCO2 30 22 - 32 mmol/L   O2 Saturation 99.0 %   Acid-Base Excess 5.0 (H) 0.0 - 2.0 mmol/L   Sodium 141 135 - 145 mmol/L   Potassium 3.8 3.5 - 5.1 mmol/L    Calcium, Ion 1.19 1.15 - 1.40 mmol/L   HCT 29.0 (L) 39.0 - 52.0 %   Hemoglobin 9.9 (L) 13.0 - 17.0 g/dL   Sample type ARTERIAL   Glucose, capillary     Status: Abnormal   Collection Time: 09/07/20  3:42 PM  Result Value Ref Range   Glucose-Capillary 118 (H) 70 - 99 mg/dL    Comment: Glucose reference range applies only to samples taken after fasting for at least 8 hours.  Glucose, capillary     Status: Abnormal   Collection Time: 09/07/20  9:43 PM  Result Value Ref Range   Glucose-Capillary 267 (H) 70 - 99 mg/dL    Comment: Glucose reference range applies only to samples taken after fasting for at least 8 hours.  Basic metabolic panel     Status: Abnormal   Collection Time: 09/08/20  3:57 AM  Result Value Ref Range   Sodium 139 135 - 145 mmol/L   Potassium 3.6 3.5 - 5.1 mmol/L   Chloride 104 98 - 111 mmol/L   CO2 24 22 - 32 mmol/L   Glucose, Bld 122 (H) 70 - 99 mg/dL    Comment: Glucose reference range applies only to samples taken after fasting for at least 8 hours.   BUN 18 8 - 23 mg/dL   Creatinine, Ser 0.78 0.61 - 1.24 mg/dL   Calcium 8.7 (L) 8.9 - 10.3 mg/dL   GFR, Estimated >60 >60 mL/min    Comment: (NOTE) Calculated using the CKD-EPI Creatinine Equation (2021)    Anion gap 11 5 - 15    Comment: Performed at [facility] [address], [place], [place] [address]  APTT     Status: Abnormal   Collection Time: 09/08/20  3:57 AM  Result Value Ref Range   aPTT 64 (H) 24 - 36 seconds    Comment:        IF BASELINE aPTT IS ELEVATED, SUGGEST PATIENT RISK ASSESSMENT BE USED TO DETERMINE APPROPRIATE ANTICOAGULANT THERAPY. Performed at [facility], [place] [address] [place], [place] [address]   Heparin level (unfractionated)     Status: None   Collection Time: 09/08/20  3:57 AM  Result Value Ref Range   Heparin Unfractionated 0.60 0.30 - 0.70 IU/mL    Comment: (NOTE) If heparin results are below expected values, and patient dosage has  been confirmed,  suggest follow up testing of antithrombin III levels. Performed at [facility], [place] [address] [place], [place] [address]   CBC     Status: Abnormal   Collection Time: 09/08/20  3:57 AM  Result Value Ref Range   WBC 5.9 4.0 - 10.5 K/uL   RBC 3.85 (L) 4.22 - 5.81 MIL/uL   Hemoglobin 8.3 (L) 13.0 - 17.0 g/dL    Comment: Reticulocyte Hemoglobin testing may be clinically indicated, consider ordering this additional test [id number]    HCT 28.6 (L) 39.0 - 52.0 %   MCV 74.3 (L) 80.0 - 100.0 fL  MCH 21.6 (L) 26.0 - 34.0 pg   MCHC 29.0 (L) 30.0 - 36.0 g/dL   RDW 17.5 (H) 11.5 - 15.5 %   Platelets 139 (L) 150 - 400 K/uL   nRBC 0.0 0.0 - 0.2 %    Comment: Performed at [facility] [address] [place], [place] [address]  Glucose, capillary     Status: Abnormal   Collection Time: 09/08/20  6:36 AM  Result Value Ref Range   Glucose-Capillary 115 (H) 70 - 99 mg/dL    Comment: Glucose reference range applies only to samples taken after fasting for at least 8 hours.  Glucose, capillary     Status: Abnormal   Collection Time: 09/08/20  8:01 AM  Result Value Ref Range   Glucose-Capillary 128 (H) 70 - 99 mg/dL    Comment: Glucose reference range applies only to samples taken after fasting for at least 8 hours.  Glucose, capillary     Status: Abnormal   Collection Time: 09/08/20 11:47 AM  Result Value Ref Range   Glucose-Capillary 198 (H) 70 - 99 mg/dL    Comment: Glucose reference range applies only to samples taken after fasting for at least 8 hours.  APTT     Status: Abnormal   Collection Time: 09/08/20 11:50 AM  Result Value Ref Range   aPTT 59 (H) 24 - 36 seconds    Comment:        IF BASELINE aPTT IS ELEVATED, SUGGEST PATIENT RISK ASSESSMENT BE USED TO DETERMINE APPROPRIATE ANTICOAGULANT THERAPY. Performed at [facility], [place] [address], [place], [place] [address]     CARDIAC CATHETERIZATION  Result Date: 09/07/2020  Prox RCA lesion is 80% stenosed.   Mid RCA lesion is 80% stenosed.  Prox LAD lesion is 80% stenosed.  Hemodynamic findings consistent with aortic valve stenosis.  NISHAAN STANKE is a 75 y.o. male  [id number] LOCATION:  FACILITY: [facility] PHYSICIAN: Quay Burow, M.D. [birth year] DATE OF PROCEDURE:  09/07/2020 DATE OF DISCHARGE: CARDIAC CATHETERIZATION History obtained from chart review.75 y.o. male with past medical history of permanent atrial fibrillation, aortic stenosis, hypertension, diabetes mellitus, prior CVA, hyperlipidemia, obstructive sleep apnea, anemia admitted with chest pain and dyspnea and noted to have abnormal nuclear study as an outpatient.  Echocardiogram shows ejection fraction 45 to 50%, biatrial enlargement, severe aortic stenosis with mean gradient 35 mmHg.   Mr. Guerette has severely calcified coronary arteries with severe stenosis in the LAD and RCA as well as severe aortic stenosis.  He will need CABG x2 and aortic valve replacement.  The radial sheath was removed and a TR band was placed on the right wrist to achieve patent hemostasis.  The right antecubital sheath venous sheath was removed and a bandage applied.  The patient left lab in stable condition.  TCS will be notified.  Heparin will be restarted without a bolus 4 hours after sheath removal. Quay Burow. MD, [facility] 09/07/2020 3:26 PM    Review of Systems  Constitutional: Positive for activity change, fatigue and unexpected weight change.  HENT: Negative.   Eyes: Negative.   Respiratory: Positive for chest tightness and shortness of breath.   Cardiovascular: Positive for chest pain and leg swelling.  Gastrointestinal: Negative.   Endocrine: Negative.   Genitourinary: Negative.   Musculoskeletal: Positive for arthralgias.  Skin: Negative.   Neurological: Negative for dizziness and syncope.  Hematological: Negative.   Psychiatric/Behavioral: Negative.    Blood pressure 129/75, pulse (!) 56, temperature 97.7 F (36.5  C), temperature source Oral, resp.  rate 19, height _0  (1.778 m), weight 118 kg, SpO2 97 %. Physical Exam Constitutional:      Appearance: He is well-developed. He is obese.  HENT:     Head: Normocephalic and atraumatic.     Mouth/Throat:     Mouth: Mucous membranes are moist.     Pharynx: Oropharynx is clear.  Eyes:     Conjunctiva/sclera: Conjunctivae normal.     Pupils: Pupils are equal, round, and reactive to light.  Neck:     Comments: Transmitted murmur or bruits on both sides of the neck Cardiovascular:     Rate and Rhythm: Normal rate. Rhythm irregular.     Pulses: Normal pulses.     Heart sounds: Normal heart sounds.     Comments: 3/6 systolic murmur along the right sternal border.  There is no diastolic murmur. Pulmonary:     Effort: Pulmonary effort is normal.     Breath sounds: Normal breath sounds.  Abdominal:     Tenderness: There is no abdominal tenderness.  Musculoskeletal:        General: Swelling present. Normal range of motion.     Cervical back: Normal range of motion and neck supple.  Skin:    General: Skin is warm and dry.  Neurological:     General: No focal deficit present.     Mental Status: He is alert and oriented to person, place, and time.  Psychiatric:        Mood and Affect: Mood normal.        Behavior: Behavior normal.    ECHOCARDIOGRAM REPORT       Patient Name:  MYER BOHLMAN Date of Exam: 09/06/2020  Medical Rec #: [id number]    Height:    70.0 in  Accession #:  [id number]    Weight:    255.0 lb  Date of Birth: Oct 14, 1945    BSA:     2.314 m  Patient Age:  36 years     BP:      136/75 mmHg  Patient Gender: M        HR:      46 bpm.  Exam Location: Inpatient   Procedure: 2D Echo, Cardiac Doppler and Color Doppler   Indications:  I42.9 Cardiomyopathy (unspecified)    History:    Patient has prior history of Echocardiogram examinations,  most         recent 11/16/2013. Stroke and Carotid  Disease, Aortic Valve         Disease, Arrythmias:Atrial Fibrillation,  Signs/Symptoms:Murmur         and Dyspnea; Risk Factors:Hypertension, Diabetes,  Dyslipidemia,         Former Smoker and Sleep Apnea. Thyroid Disease.    Sonographer:  Jonelle Sidle Dance  Referring Phys: [id number] [place]    1. Left ventricular ejection fraction, by estimation, is 45 to 50%. The  left ventricle has mildly decreased function. The left ventricle  demonstrates global hypokinesis. Left ventricular diastolic parameters are  indeterminate.  2. Right ventricular systolic function is normal. The right ventricular  size is normal. Tricuspid regurgitation signal is inadequate for assessing  PA pressure.  3. Left atrial size was moderately dilated.  4. Right atrial size was mildly dilated.  5. The mitral valve is normal in structure. Trivial mitral valve  regurgitation. No evidence of mitral stenosis. Moderate mitral annular  calcification.  6. The aortic valve is tricuspid. Aortic valve regurgitation  is not  visualized. Severe aortic valve stenosis. Aortic valve area, by VTI  measures 0.67 cm. Aortic valve mean gradient measures 35.0 mmHg.  7. Aortic dilatation noted. 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  8. The inferior vena cava is normal in size with <50% respiratory  variability, suggesting right atrial pressure of 8 mmHg.   FINDINGS  Left Ventricle: Left ventricular ejection fraction, by estimation, is 45  to 50%. The left ventricle has mildly decreased function. The left  ventricle demonstrates global hypokinesis. The left ventricular internal  cavity size was normal in size. There is  no left ventricular hypertrophy. Left ventricular diastolic parameters  are indeterminate.   Right Ventricle: The right ventricular size is normal. No increase in  right ventricular wall thickness. Right ventricular systolic  function is  normal. Tricuspid regurgitation signal is inadequate for assessing PA  pressure.   Left Atrium: Left atrial size was moderately dilated.   Right Atrium: Right atrial size was mildly dilated.   Pericardium: Trivial pericardial effusion is present.   Mitral Valve: The mitral valve is normal in structure. Moderate mitral  annular calcification. Trivial mitral valve regurgitation. No evidence of  mitral valve stenosis.   Tricuspid Valve: The tricuspid valve is normal in structure. Tricuspid  valve regurgitation is not demonstrated.   Aortic Valve: The aortic valve is tricuspid. Aortic valve regurgitation is  not visualized. Severe aortic stenosis is present. Aortic valve mean  gradient measures 35.0 mmHg. Aortic valve peak gradient measures 53.4  mmHg. Aortic valve area, by VTI measures  0.67 cm.   Pulmonic Valve: The pulmonic valve was normal in structure. Pulmonic valve  regurgitation is not visualized.   Aorta: Aortic dilatation noted. There is mild dilatation of the aortic  root, measuring 37 mm.   Venous: The inferior vena cava is normal in size with less than 50%  respiratory variability, suggesting right atrial pressure of 8 mmHg.   IAS/Shunts: No atrial level shunt detected by color flow Doppler.     LEFT VENTRICLE  PLAX 2D  LVIDd:     5.60 cm  LVIDs:     4.20 cm  LV PW:     1.40 cm  LV IVS:    1.00 cm  LVOT diam:   2.30 cm  LV SV:     69  LV SV Index:  30  LVOT Area:   4.15 cm     RIGHT VENTRICLE     IVC  RV Basal diam: 3.10 cm IVC diam: 2.00 cm  RV Mid diam:  1.40 cm  TAPSE (M-mode): 1.9 cm   LEFT ATRIUM       Index    RIGHT ATRIUM      Index  LA diam:    5.80 cm 2.51 cm/m RA Area:   25.30 cm  LA Vol (A2C):  161.0 ml 69.57 ml/m RA Volume:  82.90 ml 35.82 ml/m  LA Vol (A4C):  81.1 ml 35.04 ml/m  LA Biplane Vol: 116.0 ml 50.12 ml/m  AORTIC VALVE  AV Area (Vmax):  0.72  cm  AV Area (Vmean):  0.68 cm  AV Area (VTI):   0.67 cm  AV Vmax:      365.50 cm/s  AV Vmean:     255.500 cm/s  AV VTI:      1.027 m  AV Peak Grad:   53.4 mmHg  AV Mean Grad:   35.0 mmHg  LVOT Vmax:     63.20  cm/s  LVOT Vmean:    41.550 cm/s  LVOT VTI:     0.165 m  LVOT/AV VTI ratio: 0.16    AORTA  Ao Root diam: 3.70 cm  Ao Asc diam: 3.70 cm   MITRAL VALVE  MV Area (PHT): 2.24 cm   SHUNTS  MV Decel Time: 338 msec   Systemic VTI: 0.16 m  MV E velocity: 107.00 cm/s Systemic Diam: 2.30 cm   Loralie Champagne MD  Electronically signed by Loralie Champagne MD  Signature Date/Time: 09/06/2020/3:19:52 PM     Physicians  Panel Physicians Referring Physician Case Authorizing Physician  Lorretta Harp, MD (Primary)      Procedures  RIGHT/LEFT HEART CATH AND CORONARY ANGIOGRAPHY   Conclusion    Prox RCA lesion is 80% stenosed.  Mid RCA lesion is 80% stenosed.  Prox LAD lesion is 80% stenosed.  Hemodynamic findings consistent with aortic valve stenosis.   BURAK ZERBE is a 75 y.o. male    [id number] LOCATION:  FACILITY: [facility]  PHYSICIAN: Quay Burow, M.D. 11/02/1945   DATE OF PROCEDURE:  09/07/2020  DATE OF DISCHARGE:     CARDIAC CATHETERIZATION     History obtained from chart review.75 y.o.malewith past medical history of permanent atrial fibrillation, aortic stenosis, hypertension, diabetes mellitus, prior CVA, hyperlipidemia, obstructive sleep apnea, anemia admitted with chest pain and dyspnea and noted to have abnormal nuclear study as an outpatient. Echocardiogram shows ejection fraction 45 to 50%, biatrial enlargement, severe aortic stenosis with mean gradient 35 mmHg.    IMPRESSION: Mr. Barbier has severely calcified coronary arteries with severe stenosis in the LAD and RCA as well as severe aortic stenosis.  He will need CABG x2 and aortic valve replacement.  The radial sheath was  removed and a TR band was placed on the right wrist to achieve patent hemostasis.  The right antecubital sheath venous sheath was removed and a bandage applied.  The patient left lab in stable condition.  TCS will be notified.  Heparin will be restarted without a bolus 4 hours after sheath removal.  Quay Burow. MD, [facility] 09/07/2020 3:26 PM       Recommendations  Antiplatelet/Anticoag Recommend Aspirin 25m daily for moderate CAD.   Indications  Severe aortic stenosis [I35.0 (ICD-10-CM)]  Unstable angina (HCC) [I20.0 (ICD-10-CM)]   Procedural Details  Technical Details PROCEDURE DESCRIPTION:   The patient was brought to the second floor [facility] cath lab in the postabsorptive state. He was not premedicated . His right wrist and antecubital fossaWere prepped and shaved in usual sterile fashion. Xylocaine 1% was used or local anesthesia. A 6 French sheath was inserted into the right radial artery using standard Seldinger technique.  A 5 French sheath was inserted into the right antecubital vein.  A 5 French balloontipped Swan-Ganz catheter was then advanced through the right heart chambers obtaining sequential pressures and pulmonary artery blood samples for the determination of Fick cardiac output.  The patient received 4000 units  of heparin intravenously.  5 French right left Judkins diagnostic catheters were used for selective coronary angiography and obtaining left heart pressures.  Isovue dye was used for the entirety of the case.  Retrograde aortic and left ventricular and pullback pressures were recorded.  Radial cocktail was administered via the SideArm sheath.  Estimated blood loss <50 mL.   During this procedure no sedation was administered.   Medications (Filter: Administrations occurring from 1410 to 1526 on 09/07/20) (important) Continuous medications are totaled by the amount administered  until 09/07/20 1526.    Heparin (Porcine) in NaCl 1000-0.9  UT/500ML-% SOLN (mL) Total volume:  1,000 mL  Date/Time Rate/Dose/Volume Action   09/07/20 1424 500 mL Given   1424 500 mL Given    lidocaine (PF) (XYLOCAINE) 1 % injection (mL) Total volume:  3 mL  Date/Time Rate/Dose/Volume Action   09/07/20 1448 1 mL Given   1455 2 mL Given    Radial Cocktail (Verapamil 2.5 mg, NTG, Lidocaine) (mL) Total volume:  3 mL  Date/Time Rate/Dose/Volume Action   09/07/20 1456 3 mL Given    heparin sodium (porcine) injection (Units) Total dose:  6,000 Units  Date/Time Rate/Dose/Volume Action   09/07/20 1459 6,000 Units Given    iohexol (OMNIPAQUE) 350 MG/ML injection (mL) Total volume:  80 mL  Date/Time Rate/Dose/Volume Action   09/07/20 1523 80 mL Given    0.9 % sodium chloride infusion (mL) Total dose:  Cannot be calculated* Dosing weight:  115.7  *Administration dose not documented Date/Time Rate/Dose/Volume Action   09/07/20 1421 *Not included in total MAR Hold    acetaminophen (TYLENOL) tablet 650 mg (mg) Total dose:  Cannot be calculated* Dosing weight:  115.7  *Administration dose not documented Date/Time Rate/Dose/Volume Action   09/07/20 1421 *Not included in total MAR Hold    aspirin EC tablet 81 mg (mg) Total dose:  Cannot be calculated* Dosing weight:  115.7  *Administration dose not documented Date/Time Rate/Dose/Volume Action   09/07/20 1421 *Not included in total MAR Hold    atorvastatin (LIPITOR) tablet 40 mg (mg) Total dose:  Cannot be calculated*  *Administration dose not documented Date/Time Rate/Dose/Volume Action   09/07/20 1421 *Not included in total MAR Hold    insulin aspart (novoLOG) injection 0-20 Units (Units) Total dose:  Cannot be calculated* Dosing weight:  115.7  *Administration dose not documented Date/Time Rate/Dose/Volume Action   09/07/20 1421 *Not included in total MAR Hold    insulin aspart (novoLOG) injection 0-5 Units (Units) Total dose:  Cannot be calculated* Dosing weight:   115.7  *Administration dose not documented Date/Time Rate/Dose/Volume Action   09/07/20 1421 *Not included in total MAR Hold    insulin aspart (novoLOG) injection 6 Units (Units) Total dose:  Cannot be calculated* Dosing weight:  115.7  *Administration dose not documented Date/Time Rate/Dose/Volume Action   09/07/20 1421 *Not included in total MAR Hold    levothyroxine (SYNTHROID) tablet 224 mcg (mcg) Total dose:  Cannot be calculated*  *Administration dose not documented Date/Time Rate/Dose/Volume Action   09/07/20 1421 *Not included in total MAR Hold    losartan (COZAAR) tablet 100 mg (mg) Total dose:  Cannot be calculated*  *Administration dose not documented Date/Time Rate/Dose/Volume Action   09/07/20 1421 *Not included in total MAR Hold    ondansetron (ZOFRAN) injection 4 mg (mg) Total dose:  Cannot be calculated* Dosing weight:  115.7  *Administration dose not documented Date/Time Rate/Dose/Volume Action   09/07/20 1421 *Not included in total MAR Hold    sodium chloride flush (NS) 0.9 % injection 3 mL (mL) Total dose:  Cannot be calculated* Dosing weight:  115.7  *Administration dose not documented Date/Time Rate/Dose/Volume Action   09/07/20 1421 *Not included in total MAR Hold    sodium chloride flush (NS) 0.9 % injection 3 mL (mL) Total dose:  Cannot be calculated* Dosing weight:  115.7  *Administration dose not documented Date/Time Rate/Dose/Volume Action   09/07/20 1421 *Not included in total MAR Hold    amLODipine (NORVASC) tablet 5 mg (mg) Total dose:  Cannot be calculated* Dosing weight:  115.7  *Administration dose not documented Date/Time Rate/Dose/Volume Action   09/07/20 1421 *Not included in total MAR Hold    carvedilol (COREG) tablet 25 mg (mg) Total dose:  Cannot be calculated*  *Administration dose not documented Date/Time Rate/Dose/Volume Action   09/07/20 1421 *Not included in total MAR Hold    Contrast  Medication Name Total  Dose  iohexol (OMNIPAQUE) 350 MG/ML injection 80 mL    Radiation/Fluoro  Fluoro time: 9.5 (min) DAP: [id number] (mGycm2) Cumulative Air Kerma: 3419 (mGy)   Coronary Findings   Diagnostic Dominance: Right  Left Anterior Descending  Prox LAD lesion is 80% stenosed. The lesion is calcified.  Right Coronary Artery  Prox RCA lesion is 80% stenosed. The lesion is calcified.  Mid RCA lesion is 80% stenosed. The lesion is calcified.   Intervention   No interventions have been documented.  Right Heart  Right Heart Pressures Hemodynamic findings consistent with aortic valve stenosis. Right atrial pressure-17/17, mean 16 Right ventricular pressure-52/12 Pulmonary artery pressure-56/25, mean 35 Pulmonary wedge pressure-A-wave 30, V wave 48, mean 31 LVEDP-25 Aortic valve gradient-mean 25 Cardiac output-6.2 L/min with an index of 2.7 L/min/m Aortic valve area-1.26 cm with an index of 0.54 cm/m.   Coronary Diagrams   Diagnostic Dominance: Right    Intervention    Implants    No implant documentation for this case.   Syngo Images  Show images for CARDIAC CATHETERIZATION  Images on Long Term Storage  Show images for Jenesis, Suchy to Procedure Log  Procedure Log     Hemo Data  Flowsheet Row Most Recent Value  Fick Cardiac Output 6.2 L/min  Fick Cardiac Output Index 2.68 (L/min)/BSA  Aortic Mean Gradient 25.67 mmHg  Aortic Peak Gradient 17 mmHg  Aortic Valve Area 1.26  Aortic Value Area Index 0.54 cm2/BSA  RA A Wave 17 mmHg  RA V Wave 17 mmHg  RA Mean 16 mmHg  RV Systolic Pressure 52 mmHg  RV Diastolic Pressure 12 mmHg  RV EDP 16 mmHg  PA Systolic Pressure 56 mmHg  PA Diastolic Pressure 25 mmHg  PA Mean 35 mmHg  PW A Wave 30 mmHg  PW V Wave 48 mmHg  PW Mean 31 mmHg  AO Systolic Pressure 379 mmHg  AO Diastolic Pressure 76 mmHg  AO Mean 024 mmHg  LV Systolic Pressure 097 mmHg  LV Diastolic Pressure 22 mmHg  LV EDP 25 mmHg  AOp  Systolic Pressure 353 mmHg  AOp Diastolic Pressure 75 mmHg  AOp Mean Pressure 299 mmHg  LVp Systolic Pressure 242 mmHg  LVp Diastolic Pressure 22 mmHg  LVp EDP Pressure 27 mmHg  QP/QS 1  TPVR Index 13.07 HRUI  TSVR Index 38.13 HRUI  PVR SVR Ratio 0.05  TPVR/TSVR Ratio 0.34     Assessment/Plan:  This 75 year old gentleman has severe two-vessel coronary disease and severe aortic stenosis with New York Heart Association class III symptoms of exertional fatigue and shortness of breath consistent with chronic diastolic congestive heart failure in addition to exertional angina that is becoming more frequent.  His coronary arteries are heavily calcified and not amenable to PCI.  I agree that the best treatment for him is coronary bypass graft surgery and aortic valve replacement with a bioprosthetic valve as well as clipping of his left atrial appendage. I discussed the operative procedure with the patient and his wife including alternatives, benefits and risks; including but not limited to bleeding, blood transfusion, infection, stroke, myocardial  infarction, graft failure, heart block requiring a permanent pacemaker, organ dysfunction, and death.  Gevena Barre understands and agrees to proceed.  We will schedule surgery for Monday.  I spent 60 minutes performing this consultation and > 50% of this time was spent face to face counseling and coordinating the care of this patient's severe multivessel coronary disease and severe aortic stenosis.  Fernande Boyden Jakyria Bleau 09/08/2020, 2:30 PM

## 2020-09-08 NOTE — Progress Notes (Signed)
ANTICOAGULATION CONSULT NOTE - Follow Up Consult  Pharmacy Consult for Heparin Indication: atrial fibrillation and CAD (awaiting surgical consult)  Allergies  Allergen Reactions  . Codeine Nausea And Vomiting  . Demerol Nausea And Vomiting    Patient Measurements: Height: 5\' 10"  (177.8 cm) Weight: 118 kg (260 lb 3.2 oz) IBW/kg (Calculated) : 73 Heparin Dosing Weight: 100 kg  Vital Signs: Temp: 97.7 F (36.5 C) (03/04 1237) Temp Source: Oral (03/04 1237) BP: 129/75 (03/04 1237) Pulse Rate: 56 (03/04 1237)  Labs: Recent Labs    09/05/20 2141 09/06/20 0043 09/06/20 1915 09/07/20 0214 09/07/20 1456 09/07/20 1457 09/07/20 1501 09/08/20 0357 09/08/20 1150  HGB 9.2*  --   --  8.7*   < > 10.2* 9.9* 8.3*  --   HCT 32.4*  --   --  30.2*   < > 30.0* 29.0* 28.6*  --   PLT 191  --   --  150  --   --   --  139*  --   APTT 39*  --  53* 66*  --   --   --  64* 59*  LABPROT 16.7*  --   --   --   --   --   --   --   --   INR 1.4*  --   --   --   --   --   --   --   --   HEPARINUNFRC  --   --  1.96* 1.44*  --   --   --  0.60  --   CREATININE 0.94  --   --  0.83  --   --   --  0.78  --   TROPONINIHS 17 20*  --   --   --   --   --   --   --    < > = values in this interval not displayed.    Estimated Creatinine Clearance: 104.3 mL/min (by C-G formula based on SCr of 0.78 mg/dL).  Assessment:  75 yr old male with hx Afib on Eliquis 5 mg BID PTA (last dose 3/2 am here).  Eliquis held for cath and IV heparin begun 3/2.  Heparin held for cath on 3/3 and resumed post-cath last night.  Using aPTTs for heparin monitoring while heparin levels expected to be falsely elevated due to recent Eliquis doses.    APTT is 59 seconds on 1500 units/hr. Below target range. Hemoglobin and platelet count trended down.  RN reports some bleeding from L antecubital IV site and TR band site last night. No bleeding today. IV site changed to R hand early am, prior to heparin rate increase from 1400 to 1500  units/hr ~6am.  Goal of Therapy:  Heparin level 0.3-0.7 units/ml aPTT 66-102 seconds Monitor platelets by anticoagulation protocol: Yes   Plan:   Increase heparin drip to 1650 units/hr  aPTT ~6 hrs after rate change.  Daily aPTT and heparin level until correlating; daily CBC.  Eliquis on hold.  Follow up surgical consult.  66, RPh 09/08/2020,1:21 PM

## 2020-09-08 NOTE — Progress Notes (Signed)
Patient's HR in 30s-40s. Paged MD. Patient reports being asymptomatic. Current heart rate low to mid 50s.

## 2020-09-08 NOTE — Progress Notes (Signed)
ANTICOAGULATION CONSULT NOTE - Follow Up Consult  Pharmacy Consult for Heparin Indication: : atrial fibrillation and CAD   Allergies  Allergen Reactions  . Codeine Nausea And Vomiting  . Demerol Nausea And Vomiting    Patient Measurements: Height: 5\' 10"  (177.8 cm) Weight: 118 kg (260 lb 3.2 oz) IBW/kg (Calculated) : 73 Heparin Dosing Weight: 100 kg  Vital Signs: Temp: 97.7 F (36.5 C) (03/04 1916) Temp Source: Oral (03/04 1916) BP: 154/82 (03/04 1916) Pulse Rate: 65 (03/04 1916)  Labs: Recent Labs    09/05/20 2141 09/06/20 0043 09/06/20 1915 09/07/20 0214 09/07/20 1456 09/07/20 1457 09/07/20 1501 09/08/20 0357 09/08/20 1150 09/08/20 2045  HGB 9.2*  --   --  8.7*   < > 10.2* 9.9* 8.3*  --   --   HCT 32.4*  --   --  30.2*   < > 30.0* 29.0* 28.6*  --   --   PLT 191  --   --  150  --   --   --  139*  --   --   APTT 39*  --  53* 66*  --   --   --  64* 59* 63*  LABPROT 16.7*  --   --   --   --   --   --   --   --   --   INR 1.4*  --   --   --   --   --   --   --   --   --   HEPARINUNFRC  --   --  1.96* 1.44*  --   --   --  0.60  --   --   CREATININE 0.94  --   --  0.83  --   --   --  0.78  --   --   TROPONINIHS 17 20*  --   --   --   --   --   --   --   --    < > = values in this interval not displayed.    Estimated Creatinine Clearance: 104.3 mL/min (by C-G formula based on SCr of 0.78 mg/dL).  Assessment: Anticoag: Heparin for afib with h/o CVA and new CAD. APTT 63 slightly low.  Goal of Therapy:  aPTT 66-102 seconds Monitor platelets by anticoagulation protocol: Yes   Plan:  Increase IV Heparin to  1750 units/hr Daily HL, aPTT and CBC  Annaleigh Steinmeyer S. 2046, PharmD, BCPS Clinical Staff Pharmacist [email or web address]  Merilynn Finland, Cordarious Zeek Stillinger 09/08/2020,9:23 PM

## 2020-09-08 NOTE — Progress Notes (Signed)
ANTICOAGULATION CONSULT NOTE - Follow Up Consult  Pharmacy Consult for heparin Indication: Afib and CAD (awaiting CABG consult)  Labs: Recent Labs    09/05/20 1651 09/05/20 1715 09/05/20 2141 09/06/20 0043 09/06/20 1915 09/07/20 0214 09/07/20 1456 09/07/20 1457 09/07/20 1501 09/08/20 0357  HGB   < > 9.8* 9.2*  --   --  8.7* 9.9* 10.2* 9.9*  --   HCT   < > 32.6* 32.4*  --   --  30.2* 29.0* 30.0* 29.0*  --   PLT  --  198 191  --   --  150  --   --   --   --   APTT   < >  --  39*  --  53* 66*  --   --   --  64*  LABPROT  --   --  16.7*  --   --   --   --   --   --   --   INR  --   --  1.4*  --   --   --   --   --   --   --   HEPARINUNFRC  --   --   --   --  1.96* 1.44*  --   --   --  0.60  CREATININE  --   --  0.94  --   --  0.83  --   --   --  0.78  TROPONINIHS  --   --  17 20*  --   --   --   --   --   --    < > = values in this interval not displayed.    Assessment: 75yo male subtherapeutic on heparin after resumed post-cath; no gtt issues or signs of bleeding per RN.  Goal of Therapy:  aPTT 66-102 seconds   Plan:  Will increase heparin gtt by 1 unit/kg/hr to 1500 units/hr and check PTT in 6 hours.    Vernard Gambles, PharmD, BCPS  09/08/2020,5:48 AM

## 2020-09-08 NOTE — Progress Notes (Signed)
Progress Note  Patient Name: Mason Patton Date of Encounter: 09/08/2020  [facility] Cardiologist: [name], MD   Subjective   Denies CP or dyspnea  Inpatient Medications    Scheduled Meds: . amLODipine  5 mg Oral Daily  . aspirin EC  81 mg Oral Daily  . atorvastatin  40 mg Oral QHS  . carvedilol  25 mg Oral BID  . insulin aspart  0-20 Units Subcutaneous TID WC  . insulin aspart  0-5 Units Subcutaneous QHS  . insulin aspart  6 Units Subcutaneous TID WC  . levothyroxine  224 mcg Oral QAC breakfast  . losartan  100 mg Oral Daily  . mupirocin ointment  1 application Nasal BID  . sodium chloride flush  3 mL Intravenous Q12H   Continuous Infusions: . sodium chloride    . heparin 1,500 Units/hr (09/08/20 0552)   PRN Meds: sodium chloride, acetaminophen, ondansetron (ZOFRAN) IV, sodium chloride flush   Vital Signs    Vitals:   09/08/20 0007 09/08/20 0529 09/08/20 0535 09/08/20 0821  BP: (!) 142/93 136/79  109/90  Pulse: 60 61  (!) 107  Resp: 20 16  19   Temp: 97.6 F (36.4 C) 98 F (36.7 C)  98 F (36.7 C)  TempSrc: Oral Oral  Oral  SpO2: 98% 98%  100%  Weight:   118 kg   Height:        Intake/Output Summary (Last 24 hours) at 09/08/2020 0923 Last data filed at 09/08/2020 11/08/2020 Gross per 24 hour  Intake 1514 ml  Output 775 ml  Net 739 ml   Last 3 Weights 09/08/2020 09/05/2020 09/05/2020  Weight (lbs) 260 lb 3.2 oz 255 lb 260 lb 6.4 oz  Weight (kg) 118.026 kg 115.667 kg 118.117 kg       Physical Exam   GEN: NAD Neck: supple Cardiac: irregular, 3/6 systolic murmur, no gallop Respiratory: CTA GI: Soft, NT/ND MS: No edema Neuro:  Grossly intact Psych: Normal affect   Labs    High Sensitivity Troponin:   Recent Labs  Lab 09/05/20 2141 09/06/20 0043  TROPONINIHS 17 20*      Chemistry Recent Labs  Lab 09/05/20 2141 09/07/20 0214 09/07/20 1456 09/07/20 1457 09/07/20 1501 09/08/20 0357  NA 141 139   < > 142 141 139  K 3.5 3.5   < >  3.8 3.8 3.6  CL 103 101  --   --   --  104  CO2 28 28  --   --   --  24  GLUCOSE 240* 107*  --   --   --  122*  BUN 21 18  --   --   --  18  CREATININE 0.94 0.83  --   --   --  0.78  CALCIUM 9.2 9.0  --   --   --  8.7*  PROT 6.1*  --   --   --   --   --   ALBUMIN 3.5  --   --   --   --   --   AST 23  --   --   --   --   --   ALT 21  --   --   --   --   --   ALKPHOS 86  --   --   --   --   --   BILITOT 1.1  --   --   --   --   --  GFRNONAA >60 >60  --   --   --  >60  ANIONGAP 10 10  --   --   --  11   < > = values in this interval not displayed.     Hematology Recent Labs  Lab 09/05/20 2141 09/07/20 0214 09/07/20 1456 09/07/20 1457 09/07/20 1501 09/08/20 0357  WBC 7.3 6.5  --   --   --  5.9  RBC 4.26 3.99*  --   --   --  3.85*  HGB 9.2* 8.7*   < > 10.2* 9.9* 8.3*  HCT 32.4* 30.2*   < > 30.0* 29.0* 28.6*  MCV 76.1* 75.7*  --   --   --  74.3*  MCH 21.6* 21.8*  --   --   --  21.6*  MCHC 28.4* 28.8*  --   --   --  29.0*  RDW 17.6* 17.5*  --   --   --  17.5*  PLT 191 150  --   --   --  139*   < > = values in this interval not displayed.    BNP Recent Labs  Lab 09/05/20 1651 09/06/20 0703  BNP  --  803.0*  PROBNP 3,817*  --      Radiology    CARDIAC CATHETERIZATION  Result Date: 09/07/2020  Prox RCA lesion is 80% stenosed.  Mid RCA lesion is 80% stenosed.  Prox LAD lesion is 80% stenosed.  Hemodynamic findings consistent with aortic valve stenosis.  Mason Patton is a 75 y.o. male  [id number] LOCATION:  FACILITY: [facility] PHYSICIAN: Nanetta BattyJonathan Berry, M.D. Mar 14, 1946 DATE OF PROCEDURE:  09/07/2020 DATE OF DISCHARGE: CARDIAC CATHETERIZATION History obtained from chart review.75 y.o. male with past medical history of permanent atrial fibrillation, aortic stenosis, hypertension, diabetes mellitus, prior CVA, hyperlipidemia, obstructive sleep apnea, anemia admitted with chest pain and dyspnea and noted to have abnormal nuclear study as an outpatient.  Echocardiogram shows  ejection fraction 45 to 50%, biatrial enlargement, severe aortic stenosis with mean gradient 35 mmHg.   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  He will need CABG x2 and aortic valve replacement.  The radial sheath was removed and a TR band was placed on the right wrist to achieve patent hemostasis.  The right antecubital sheath venous sheath was removed and a bandage applied.  The patient left lab in stable condition.  TCS will be notified.  Heparin will be restarted without a bolus 4 hours after sheath removal. Nanetta BattyJonathan Berry. MD, [facility] 09/07/2020 3:26 PM   ECHOCARDIOGRAM COMPLETE  Result Date: 09/06/2020    ECHOCARDIOGRAM REPORT   [name] Date of Exam: 09/06/2020 Medical Rec #:  [id number]        Height:       70.0 in Accession #:    [id number]       Weight:       255.0 lb Date of Birth:  Mar 14, 1946        BSA:          2.314 m Patient Age:    74 years         BP:           136/75 mmHg Patient Gender: M                HR:           46 bpm. Exam  Location:  Inpatient Procedure: 2D Echo, Cardiac Doppler and Color Doppler Indications:    I42.9 Cardiomyopathy (unspecified)  History:        Patient has prior history of Echocardiogram examinations, most                 recent 11/16/2013. Stroke and Carotid Disease, Aortic Valve                 Disease, Arrythmias:Atrial Fibrillation, Signs/Symptoms:Murmur                 and Dyspnea; Risk Factors:Hypertension, Diabetes, Dyslipidemia,                 Former Smoker and Sleep Apnea. Thyroid Disease.  Sonographer:    Elmarie Shiley Dance Referring Phys: [id number] CARRIEL T NIPP IMPRESSIONS  1. Left ventricular ejection fraction, by estimation, is 45 to 50%. The left ventricle has mildly decreased function. The left ventricle demonstrates global hypokinesis. Left ventricular diastolic parameters are indeterminate.  2. Right ventricular systolic function is normal. The right  ventricular size is normal. Tricuspid regurgitation signal is inadequate for assessing PA pressure.  3. Left atrial size was moderately dilated.  4. Right atrial size was mildly dilated.  5. The mitral valve is normal in structure. Trivial mitral valve regurgitation. No evidence of mitral stenosis. Moderate mitral annular calcification.  6. The aortic valve is tricuspid. Aortic valve regurgitation is not visualized. Severe aortic valve stenosis. Aortic valve area, by VTI measures 0.67 cm. Aortic valve mean gradient measures 35.0 mmHg.  7. Aortic dilatation noted. 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  8. The inferior vena cava is normal in size with <50% respiratory variability, suggesting right atrial pressure of 8 mmHg. FINDINGS  Left Ventricle: Left ventricular ejection fraction, by estimation, is 45 to 50%. The left ventricle has mildly decreased function. The left ventricle demonstrates global hypokinesis. The left ventricular internal cavity size was normal in size. There is  no left ventricular hypertrophy. Left ventricular diastolic parameters are indeterminate. Right Ventricle: The right ventricular size is normal. No increase in right ventricular wall thickness. Right ventricular systolic function is normal. Tricuspid regurgitation signal is inadequate for assessing PA pressure. Left Atrium: Left atrial size was moderately dilated. Right Atrium: Right atrial size was mildly dilated. Pericardium: Trivial pericardial effusion is present. Mitral Valve: The mitral valve is normal in structure. Moderate mitral annular calcification. Trivial mitral valve regurgitation. No evidence of mitral valve stenosis. Tricuspid Valve: The tricuspid valve is normal in structure. Tricuspid valve regurgitation is not demonstrated. Aortic Valve: The aortic valve is tricuspid. Aortic valve regurgitation is not visualized. Severe aortic stenosis is present. Aortic valve mean gradient measures 35.0 mmHg.  Aortic valve peak gradient measures 53.4 mmHg. Aortic valve area, by VTI measures  0.67 cm. Pulmonic Valve: The pulmonic valve was normal in structure. Pulmonic valve regurgitation is not visualized. Aorta: Aortic dilatation noted. 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 Venous: The inferior vena cava is normal in size with less than 50% respiratory variability, suggesting right atrial pressure of 8 mmHg. IAS/Shunts: No atrial level shunt detected by color flow Doppler.  LEFT VENTRICLE PLAX 2D LVIDd:         5.60 cm LVIDs:         4.20 cm LV PW:         1.40 cm LV IVS:        1.00 cm LVOT diam:  2.30 cm LV SV:         69 LV SV Index:   30 LVOT Area:     4.15 cm  RIGHT VENTRICLE          IVC RV Basal diam:  3.10 cm  IVC diam: 2.00 cm RV Mid diam:    1.40 cm TAPSE (M-mode): 1.9 cm LEFT ATRIUM              Index       RIGHT ATRIUM           Index LA diam:        5.80 cm  2.51 cm/m  RA Area:     25.30 cm LA Vol (A2C):   161.0 ml 69.57 ml/m RA Volume:   82.90 ml  35.82 ml/m LA Vol (A4C):   81.1 ml  35.04 ml/m LA Biplane Vol: 116.0 ml 50.12 ml/m  AORTIC VALVE AV Area (Vmax):    0.72 cm AV Area (Vmean):   0.68 cm AV Area (VTI):     0.67 cm AV Vmax:           365.50 cm/s AV Vmean:          255.500 cm/s AV VTI:            1.027 m AV Peak Grad:      53.4 mmHg AV Mean Grad:      35.0 mmHg LVOT Vmax:         63.20 cm/s LVOT Vmean:        41.550 cm/s LVOT VTI:          0.165 m LVOT/AV VTI ratio: 0.16  AORTA Ao Root diam: 3.70 cm Ao Asc diam:  3.70 cm MITRAL VALVE MV Area (PHT): 2.24 cm     SHUNTS MV Decel Time: 338 msec     Systemic VTI:  0.16 m MV E velocity: 107.00 cm/s  Systemic Diam: 2.30 cm Marca Ancona MD Electronically signed by Marca Ancona MD Signature Date/Time: 09/06/2020/3:19:52 PM    Final     Patient Profile     75 y.o. male with past medical history of permanent atrial fibrillation, aortic stenosis, hypertension, diabetes mellitus, prior CVA, hyperlipidemia, obstructive  sleep apnea, anemia admitted with chest pain and dyspnea and noted to have abnormal nuclear study as an outpatient.  Echocardiogram shows ejection fraction 45 to 50%, biatrial enlargement, severe aortic stenosis with mean gradient 35 mmHg.  Cardiac catheterization revealed an 80% proximal and mid right coronary artery and 80% proximal LAD.  Assessment & Plan    1 unstable angina-Continue aspirin, statin, heparin and beta-blocker.  Cardiac catheterization results noted.  Plan is for coronary artery bypass and graft and aortic valve replacement. Awaiting CVTS consult.  2 aortic stenosis-severe on echocardiogram.  Plan AVR at time of coronary artery bypass and graft.  3 hypertension-blood pressure now trending down.  We will hold amlodipine.  Continue ARB and beta-blocker (change coreg to 12.5 BID as HR decreased).  4 microcytic anemia-patient was scheduled for an outpatient colonoscopy which has been delayed.  Will need GI evaluation at some point.  5 permanent atrial fibrillation-continue beta-blocker for rate control.  Apixaban on hold for procedures.  Continue IV heparin.  6 acute on chronic diastolic congestive heart failure-pulmonary capillary wedge pressure 31 at time of catheterization.  Add Lasix 40 mg twice daily and follow renal function.  7 obstructive sleep apnea-continue CPAP.  For questions or updates, please contact [facility] Please consult [email or web address]  for contact info under        Signed, Olga Millers, MD  09/08/2020, 9:23 AM

## 2020-09-08 NOTE — Consult Note (Signed)
[address]       [place] [address]             [phone]      Cardiothoracic Surgery Consultation  Reason for Consult: Severe multivessel coronary disease and severe aortic stenosis Referring Physician: Dr. Lenna Sciara. Mason Patton is an 75 y.o. male.  HPI:   The patient is a 75 year old gentleman with history of type 2 diabetes, hypertension, hyperlipidemia, hypothyroidism, OSA on CPAP, chronic atrial fibrillation on anticoagulation, 50-pack-year smoking history with ongoing 1/2 pack/day smoking, and known 50 to 69% right internal carotid artery stenosis by ultrasound in 10/2019 who presents with a 2 to 53-monthhistory of progressive exertional fatigue, substernal chest pain and shortness of breath as well as lower extremity edema.  It has gotten to the point where he cannot do any activity and spends most of his day sitting in the house watching TV and reading.  He has been having chest discomfort every day.  [facility] with worsening chest discomfort and shortness of breath.  2D echocardiogram on 09/06/2020 showed left ventricular ejection fraction of 45 to 50% with mild left ventricular global hypokinesis.  The aortic valve is trileaflet with severe aortic stenosis.  The aortic valve area by VTI 0.67 cm.  The mean gradient was 35 mmHg.  Right ventricular systolic function was normal.  There was trivial mitral regurgitation.  He underwent cardiac catheterization yesterday showing severe two-vessel coronary disease with diffusely calcified coronaries.  The proximal LAD had an 80% hazy stenosis.  The RCA had 80% proximal and 80% mid stenoses.  PA pressure was 56/25 with a mean of 35.  Mean pulmonary wedge pressure was 31 with an LVEDP of 25.  The mean gradient across the aortic valve was 25 mmHg.  Cardiac index was 2.7.  The patient lives with his wife.  He sees his dentist every 6  months and has had no dental problems.  Past Medical History:  Diagnosis Date  . Aortic stenosis    09/30/19 echo ([facility], Cardiologist Dr. LGeraldo Pitter: Mild-moderate AS, MaxVel 308 cm/s, MeanPG 22 mmHg, AVA 1.6 cm2  . Arthritis   . Atrial fibrillation ([place]   . Carotid artery stenosis    11/03/19 [place]([facility]): 50-69% RICA stenosis  . Cholecystitis with cholelithiasis   . Eczema   . Emphysema of lung ([place]   . Gallstones and inflammation of gallbladder without obstruction   . Heart murmur   . History of nuclear stress test 02/23/2010   dipyridamole; normal pattern of perfusion in all regions; normal, low risk study   . Hyperlipidemia   . Hypertension   . 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 Meningoencephalitis   . Obesity   . OSA on CPAP    last sleep study >8 yrs  . Pneumonia    "twice" (05/19/2013)  . Shortness of breath   . Stroke ([facility]    "they said I had a minor one when I had menigitis" (05/19/2013)  . Thyroid disease   . Tobacco abuse    quit 11/08/2012  . Type II diabetes mellitus ([place]   . Umbilical hernia     Past Surgical History:  Procedure Laterality Date  . CHOLECYSTECTOMY  09/09/2011   Procedure: LAPAROSCOPIC CHOLECYSTECTOMY WITH INTRAOPERATIVE CHOLANGIOGRAM;  Surgeon: JBelva Crome MD;  [facility] [facility]  Service: General;  Laterality: N/A;  . HERNIA REPAIR  04/2006   UHR  . JOINT REPLACEMENT    . KNEE ARTHROSCOPY Right    "w2 times" (05/19/2013)  . RIGHT/LEFT HEART CATH AND CORONARY ANGIOGRAPHY N/A 09/07/2020   Procedure: RIGHT/LEFT HEART CATH AND CORONARY ANGIOGRAPHY;  Surgeon: Lorretta Harp, MD;  Location: [place] [facility];  Service: Cardiovascular;  Laterality: N/A;  . TONSILLECTOMY    . TOTAL KNEE ARTHROPLASTY Left 05/19/2013  . TOTAL KNEE ARTHROPLASTY Left 05/19/2013   Procedure: TOTAL KNEE ARTHROPLASTY;  Surgeon: Newt Minion, MD;  Location: [place];  Service: Orthopedics;  Laterality: Left;   Left Total Knee Arthroplasty  . TOTAL KNEE ARTHROPLASTY Right 11/24/2019  . TOTAL KNEE ARTHROPLASTY Right 11/24/2019   Procedure: RIGHT TOTAL KNEE ARTHROPLASTY;  Surgeon: Newt Minion, MD;  Location: [facility];  Service: Orthopedics;  Laterality: Right;  . TRANSTHORACIC ECHOCARDIOGRAM  03/28/2005   EF=>55%, mod conc LVH; LA mod dilated & borderline RA enlargement; mild TR; mild pulm valve regurg    Family History  Problem Relation Age of Onset  . Cancer Maternal Aunt        colon  . Cancer Maternal Aunt        lung  . Thyroid disease Mother   . Hyperlipidemia Father   . Diabetes Maternal Grandmother   . Asthma Child   . Diabetes type I Child     Social History:  reports that he has been smoking cigarettes. He has a 25.00 pack-year smoking history. He has never used smokeless tobacco. He reports that he does not drink alcohol and does not use drugs.  Allergies:  Allergies  Allergen Reactions  . Codeine Nausea And Vomiting  . Demerol Nausea And Vomiting    Medications:  I have reviewed the patient's current medications. Prior to Admission:  Medications Prior to Admission  Medication Sig Dispense Refill Last Dose  . apixaban (ELIQUIS) 5 MG TABS tablet Take 5 mg by mouth 2 (two) times daily.   09/05/2020 at 10 am  . atorvastatin (LIPITOR) 40 MG tablet Take 40 mg by mouth at bedtime. Reported on 01/11/2016   09/05/2020 at Unknown time  . Blood Glucose Monitoring Suppl (ONETOUCH VERIO FLEX SYSTEM) W/DEVICE KIT 1 each by Does not apply route daily. Dx: E11.9 1 kit 0 09/05/2020 at Unknown time  . carvedilol (COREG) 25 MG tablet Take 1 tablet by mouth 2 (two) times daily.   09/05/2020 at 7.30 pm  . cholecalciferol (VITAMIN D3) 25 MCG (1000 UNIT) tablet Take 1,000 Units by mouth daily.   09/05/2020 at Unknown time  . ferrous sulfate 325 (65 FE) MG tablet Take 1 tablet by mouth 3 (three) times a week.   09/04/2020 at Unknown time  . furosemide (LASIX) 20 MG tablet Take 3 tablets (60 mg total) by mouth  in the morning and at bedtime. (Patient taking differently: Take 60 mg by mouth daily.) 270 tablet 3 09/05/2020 at Unknown time  . glipiZIDE (GLUCOTROL) 10 MG tablet Take 10 mg by mouth 2 (two) times daily before a meal.   09/05/2020 at Unknown time  . glucose blood (ONETOUCH VERIO) test strip Use to test blood sugar 2-3 times daily as instructed. Dx: E11.9 200 each 3 09/05/2020 at Unknown time  . levothyroxine (SYNTHROID) 112 MCG tablet Take 224 mcg by mouth daily before breakfast.   09/05/2020 at Unknown time  . losartan (COZAAR) 100 MG tablet Take 100 mg by mouth daily.   09/05/2020 at  Unknown time  . metFORMIN (GLUCOPHAGE-XR) 500 MG 24 hr tablet TAKE 4 TABLETS BY MOUTH  DAILY WITH DINNER (Patient taking differently: Take 2,000 mg by mouth 2 (two) times daily.) 360 tablet 3 09/05/2020 at Unknown time  . Multiple Vitamins-Minerals (MULTIVITAMIN WITH MINERALS) tablet Take 1 tablet by mouth daily.   09/05/2020 at Unknown time  . Multiple Vitamins-Minerals (PRESERVISION AREDS 2 PO) Take 1 capsule by mouth 2 (two) times daily.   09/05/2020 at Unknown time  . Multiple Vitamins-Minerals (PRESERVISION AREDS 2+MULTI VIT PO) Take 1 capsule by mouth in the morning and at bedtime.   09/05/2020 at Unknown time  . nitroGLYCERIN (NITROSTAT) 0.4 MG SL tablet Place 0.4 mg under the tongue every 5 (five) minutes as needed for chest pain.   Past Week at Unknown time  . omeprazole (PRILOSEC) 20 MG capsule Take 1 capsule by mouth daily.   09/05/2020 at Unknown time  . ONETOUCH DELICA LANCETS FINE MISC Use to test blood sugar 2-3 times daily as instructed. Dx: E11.9 200 each 3 09/05/2020 at Unknown time  . VENTOLIN HFA 108 (90 Base) MCG/ACT inhaler Inhale 1-2 puffs into the lungs every 4 (four) hours as needed for wheezing or shortness of breath.    last year at Unknown time  . vitamin B-12 (CYANOCOBALAMIN) 500 MCG tablet Take 1 tablet by mouth 2 (two) times daily.   09/05/2020 at Unknown time  . isosorbide mononitrate (IMDUR) 30 MG 24 hr  tablet Take 1 tablet (30 mg total) by mouth daily. 90 tablet 3 Unknown at Unknown time   Scheduled: . aspirin EC  81 mg Oral Daily  . atorvastatin  40 mg Oral QHS  . carvedilol  12.5 mg Oral BID  . [START ON 09/11/2020] epinephrine  0-10 mcg/min Intravenous To OR  . furosemide  40 mg Intravenous BID  . [START ON 09/11/2020] heparin-papaverine-plasmalyte irrigation   Irrigation To OR  . insulin aspart  0-20 Units Subcutaneous TID WC  . insulin aspart  0-5 Units Subcutaneous QHS  . insulin aspart  6 Units Subcutaneous TID WC  . [START ON 09/11/2020] insulin   Intravenous To OR  . levothyroxine  224 mcg Oral QAC breakfast  . losartan  100 mg Oral Daily  . [START ON 09/11/2020] magnesium sulfate  40 mEq Other To OR  . mupirocin ointment  1 application Nasal BID  . [START ON 09/11/2020] phenylephrine  30-200 mcg/min Intravenous To OR  . [START ON 09/11/2020] potassium chloride  80 mEq Other To OR  . sodium chloride flush  3 mL Intravenous Q12H  . [START ON 09/11/2020] tranexamic acid  15 mg/kg Intravenous To OR  . [START ON 09/11/2020] tranexamic acid  2 mg/kg Intracatheter To OR   Continuous: . sodium chloride    . [START ON 09/11/2020] cefUROXime (ZINACEF)  IV    . [START ON 09/11/2020] cefUROXime (ZINACEF)  IV    . [START ON 09/11/2020] dexmedetomidine    . [START ON 09/11/2020] heparin 30,000 units/NS 1000 mL solution for CELLSAVER    . heparin 1,500 Units/hr (09/08/20 0552)  . [START ON 09/11/2020] milrinone    . [START ON 09/11/2020] nitroGLYCERIN    . [START ON 09/11/2020] norepinephrine    . [START ON 09/11/2020] tranexamic acid (CYKLOKAPRON) infusion ([facility])     ZPH:XTAVWP chloride, acetaminophen, ondansetron (ZOFRAN) IV, sodium chloride flush Anti-infectives (From admission, onward)   Start     Dose/Rate Route Frequency Ordered Stop   09/11/20 0400  cefUROXime (ZINACEF) 1.5 g in  sodium chloride 0.9 % 100 mL IVPB        1.5 g 200 mL/hr over 30 Minutes Intravenous To Surgery 09/08/20 1420 09/12/20 0400    09/11/20 0400  cefUROXime (ZINACEF) 750 mg in sodium chloride 0.9 % 100 mL IVPB        750 mg 200 mL/hr over 30 Minutes Intravenous To Surgery 09/08/20 1420 09/12/20 0400      Results for orders placed or performed during the hospital encounter of 09/05/20 (from the past 48 hour(s))  CBG monitoring, ED     Status: Abnormal   Collection Time: 09/06/20  5:58 PM  Result Value Ref Range   Glucose-Capillary 149 (H) 70 - 99 mg/dL    Comment: Glucose reference range applies only to samples taken after fasting for at least 8 hours.  APTT     Status: Abnormal   Collection Time: 09/06/20  7:15 PM  Result Value Ref Range   aPTT 53 (H) 24 - 36 seconds    Comment:        IF BASELINE aPTT IS ELEVATED, SUGGEST PATIENT RISK ASSESSMENT BE USED TO DETERMINE APPROPRIATE ANTICOAGULANT THERAPY. Performed at [facility], [place] [address] [place], [place] [address]   Heparin level (unfractionated)     Status: Abnormal   Collection Time: 09/06/20  7:15 PM  Result Value Ref Range   Heparin Unfractionated 1.96 (H) 0.30 - 0.70 IU/mL    Comment: RESULTS CONFIRMED BY MANUAL DILUTION (NOTE) If heparin results are below expected values, and patient dosage has  been confirmed, suggest follow up testing of antithrombin III levels. Performed at [facility], [place] [address], [place], [place] [address]   CBG monitoring, ED     Status: Abnormal   Collection Time: 09/06/20 11:09 PM  Result Value Ref Range   Glucose-Capillary 114 (H) 70 - 99 mg/dL    Comment: Glucose reference range applies only to samples taken after fasting for at least 8 hours.  Basic metabolic panel     Status: Abnormal   Collection Time: 09/07/20  2:14 AM  Result Value Ref Range   Sodium 139 135 - 145 mmol/L   Potassium 3.5 3.5 - 5.1 mmol/L   Chloride 101 98 - 111 mmol/L   CO2 28 22 - 32 mmol/L   Glucose, Bld 107 (H) 70 - 99 mg/dL    Comment: Glucose reference range applies only to samples taken after fasting for at least 8  hours.   BUN 18 8 - 23 mg/dL   Creatinine, Ser 0.83 0.61 - 1.24 mg/dL   Calcium 9.0 8.9 - 10.3 mg/dL   GFR, Estimated >60 >60 mL/min    Comment: (NOTE) Calculated using the CKD-EPI Creatinine Equation (2021)    Anion gap 10 5 - 15    Comment: Performed at [facility] [address] [place], [place] [address]  Heparin level (unfractionated)     Status: Abnormal   Collection Time: 09/07/20  2:14 AM  Result Value Ref Range   Heparin Unfractionated 1.44 (H) 0.30 - 0.70 IU/mL    Comment: RESULTS CONFIRMED BY MANUAL DILUTION (NOTE) If heparin results are below expected values, and patient dosage has  been confirmed, suggest follow up testing of antithrombin III levels. Performed at [facility], [place] [address], [place], [place] [address]   CBC     Status: Abnormal   Collection Time: 09/07/20  2:14 AM  Result Value Ref Range   WBC 6.5 4.0 -  10.5 K/uL   RBC 3.99 (L) 4.22 - 5.81 MIL/uL   Hemoglobin 8.7 (L) 13.0 - 17.0 g/dL    Comment: Reticulocyte Hemoglobin testing may be clinically indicated, consider ordering this additional test [id number]    HCT 30.2 (L) 39.0 - 52.0 %   MCV 75.7 (L) 80.0 - 100.0 fL   MCH 21.8 (L) 26.0 - 34.0 pg   MCHC 28.8 (L) 30.0 - 36.0 g/dL   RDW 17.5 (H) 11.5 - 15.5 %   Platelets 150 150 - 400 K/uL    Comment: REPEATED TO VERIFY   nRBC 0.0 0.0 - 0.2 %    Comment: Performed at [facility], [place] [address], [place], [place] [address]  APTT     Status: Abnormal   Collection Time: 09/07/20  2:14 AM  Result Value Ref Range   aPTT 66 (H) 24 - 36 seconds    Comment:        IF BASELINE aPTT IS ELEVATED, SUGGEST PATIENT RISK ASSESSMENT BE USED TO DETERMINE APPROPRIATE ANTICOAGULANT THERAPY. Performed at [facility], [place] [address], [place], [place] [address]   CBG monitoring, ED     Status: Abnormal   Collection Time: 09/07/20  8:26 AM  Result Value Ref Range   Glucose-Capillary 105 (H) 70 - 99 mg/dL    Comment: Glucose  reference range applies only to samples taken after fasting for at least 8 hours.  CBG monitoring, ED     Status: Abnormal   Collection Time: 09/07/20 11:55 AM  Result Value Ref Range   Glucose-Capillary 127 (H) 70 - 99 mg/dL    Comment: Glucose reference range applies only to samples taken after fasting for at least 8 hours.   Comment 1 Notify RN    Comment 2 Document in Chart   POCT I-Stat EG7     Status: Abnormal   Collection Time: 09/07/20  2:56 PM  Result Value Ref Range   pH, Ven 7.404 7.250 - 7.430   pCO2, Ven 47.6 44.0 - 60.0 mmHg   pO2, Ven 30.0 (LL) 32.0 - 45.0 mmHg   Bicarbonate 29.7 (H) 20.0 - 28.0 mmol/L   TCO2 31 22 - 32 mmol/L   O2 Saturation 56.0 %   Acid-Base Excess 4.0 (H) 0.0 - 2.0 mmol/L   Sodium 142 135 - 145 mmol/L   Potassium 3.7 3.5 - 5.1 mmol/L   Calcium, Ion 1.22 1.15 - 1.40 mmol/L   HCT 29.0 (L) 39.0 - 52.0 %   Hemoglobin 9.9 (L) 13.0 - 17.0 g/dL   Sample type MIXED VENOUS SAMPLE   POCT I-Stat EG7     Status: Abnormal   Collection Time: 09/07/20  2:57 PM  Result Value Ref Range   pH, Ven 7.406 7.250 - 7.430   pCO2, Ven 47.8 44.0 - 60.0 mmHg   pO2, Ven 30.0 (LL) 32.0 - 45.0 mmHg   Bicarbonate 30.0 (H) 20.0 - 28.0 mmol/L   TCO2 31 22 - 32 mmol/L   O2 Saturation 57.0 %   Acid-Base Excess 5.0 (H) 0.0 - 2.0 mmol/L   Sodium 142 135 - 145 mmol/L   Potassium 3.8 3.5 - 5.1 mmol/L   Calcium, Ion 1.25 1.15 - 1.40 mmol/L   HCT 30.0 (L) 39.0 - 52.0 %   Hemoglobin 10.2 (L) 13.0 - 17.0 g/dL   Sample type MIXED VENOUS SAMPLE   I-STAT 7, (LYTES, BLD GAS, ICA, H+H)     Status: Abnormal   Collection Time: 09/07/20  3:01  PM  Result Value Ref Range   pH, Arterial 7.454 (H) 7.350 - 7.450   pCO2 arterial 41.3 32.0 - 48.0 mmHg   pO2, Arterial 136 (H) 83.0 - 108.0 mmHg   Bicarbonate 29.0 (H) 20.0 - 28.0 mmol/L   TCO2 30 22 - 32 mmol/L   O2 Saturation 99.0 %   Acid-Base Excess 5.0 (H) 0.0 - 2.0 mmol/L   Sodium 141 135 - 145 mmol/L   Potassium 3.8 3.5 - 5.1 mmol/L    Calcium, Ion 1.19 1.15 - 1.40 mmol/L   HCT 29.0 (L) 39.0 - 52.0 %   Hemoglobin 9.9 (L) 13.0 - 17.0 g/dL   Sample type ARTERIAL   Glucose, capillary     Status: Abnormal   Collection Time: 09/07/20  3:42 PM  Result Value Ref Range   Glucose-Capillary 118 (H) 70 - 99 mg/dL    Comment: Glucose reference range applies only to samples taken after fasting for at least 8 hours.  Glucose, capillary     Status: Abnormal   Collection Time: 09/07/20  9:43 PM  Result Value Ref Range   Glucose-Capillary 267 (H) 70 - 99 mg/dL    Comment: Glucose reference range applies only to samples taken after fasting for at least 8 hours.  Basic metabolic panel     Status: Abnormal   Collection Time: 09/08/20  3:57 AM  Result Value Ref Range   Sodium 139 135 - 145 mmol/L   Potassium 3.6 3.5 - 5.1 mmol/L   Chloride 104 98 - 111 mmol/L   CO2 24 22 - 32 mmol/L   Glucose, Bld 122 (H) 70 - 99 mg/dL    Comment: Glucose reference range applies only to samples taken after fasting for at least 8 hours.   BUN 18 8 - 23 mg/dL   Creatinine, Ser 0.78 0.61 - 1.24 mg/dL   Calcium 8.7 (L) 8.9 - 10.3 mg/dL   GFR, Estimated >60 >60 mL/min    Comment: (NOTE) Calculated using the CKD-EPI Creatinine Equation (2021)    Anion gap 11 5 - 15    Comment: Performed at [facility] [address], [place], [place] [address]  APTT     Status: Abnormal   Collection Time: 09/08/20  3:57 AM  Result Value Ref Range   aPTT 64 (H) 24 - 36 seconds    Comment:        IF BASELINE aPTT IS ELEVATED, SUGGEST PATIENT RISK ASSESSMENT BE USED TO DETERMINE APPROPRIATE ANTICOAGULANT THERAPY. Performed at [facility], [place] [address] [place], [place] [address]   Heparin level (unfractionated)     Status: None   Collection Time: 09/08/20  3:57 AM  Result Value Ref Range   Heparin Unfractionated 0.60 0.30 - 0.70 IU/mL    Comment: (NOTE) If heparin results are below expected values, and patient dosage has  been confirmed,  suggest follow up testing of antithrombin III levels. Performed at [facility], [place] [address] [place], [place] [address]   CBC     Status: Abnormal   Collection Time: 09/08/20  3:57 AM  Result Value Ref Range   WBC 5.9 4.0 - 10.5 K/uL   RBC 3.85 (L) 4.22 - 5.81 MIL/uL   Hemoglobin 8.3 (L) 13.0 - 17.0 g/dL    Comment: Reticulocyte Hemoglobin testing may be clinically indicated, consider ordering this additional test [id number]    HCT 28.6 (L) 39.0 - 52.0 %   MCV 74.3 (L) 80.0 - 100.0 fL  MCH 21.6 (L) 26.0 - 34.0 pg   MCHC 29.0 (L) 30.0 - 36.0 g/dL   RDW 17.5 (H) 11.5 - 15.5 %   Platelets 139 (L) 150 - 400 K/uL   nRBC 0.0 0.0 - 0.2 %    Comment: Performed at [facility] [address] [place], [place] [address]  Glucose, capillary     Status: Abnormal   Collection Time: 09/08/20  6:36 AM  Result Value Ref Range   Glucose-Capillary 115 (H) 70 - 99 mg/dL    Comment: Glucose reference range applies only to samples taken after fasting for at least 8 hours.  Glucose, capillary     Status: Abnormal   Collection Time: 09/08/20  8:01 AM  Result Value Ref Range   Glucose-Capillary 128 (H) 70 - 99 mg/dL    Comment: Glucose reference range applies only to samples taken after fasting for at least 8 hours.  Glucose, capillary     Status: Abnormal   Collection Time: 09/08/20 11:47 AM  Result Value Ref Range   Glucose-Capillary 198 (H) 70 - 99 mg/dL    Comment: Glucose reference range applies only to samples taken after fasting for at least 8 hours.  APTT     Status: Abnormal   Collection Time: 09/08/20 11:50 AM  Result Value Ref Range   aPTT 59 (H) 24 - 36 seconds    Comment:        IF BASELINE aPTT IS ELEVATED, SUGGEST PATIENT RISK ASSESSMENT BE USED TO DETERMINE APPROPRIATE ANTICOAGULANT THERAPY. Performed at [facility], [place] [address], [place], [place] [address]     CARDIAC CATHETERIZATION  Result Date: 09/07/2020  Prox RCA lesion is 80% stenosed.   Mid RCA lesion is 80% stenosed.  Prox LAD lesion is 80% stenosed.  Hemodynamic findings consistent with aortic valve stenosis.  NISHAAN STANKE is a 75 y.o. male  [id number] LOCATION:  FACILITY: [facility] PHYSICIAN: Quay Burow, M.D. [birth year] DATE OF PROCEDURE:  09/07/2020 DATE OF DISCHARGE: CARDIAC CATHETERIZATION History obtained from chart review.75 y.o. male with past medical history of permanent atrial fibrillation, aortic stenosis, hypertension, diabetes mellitus, prior CVA, hyperlipidemia, obstructive sleep apnea, anemia admitted with chest pain and dyspnea and noted to have abnormal nuclear study as an outpatient.  Echocardiogram shows ejection fraction 45 to 50%, biatrial enlargement, severe aortic stenosis with mean gradient 35 mmHg.   Mr. Guerette has severely calcified coronary arteries with severe stenosis in the LAD and RCA as well as severe aortic stenosis.  He will need CABG x2 and aortic valve replacement.  The radial sheath was removed and a TR band was placed on the right wrist to achieve patent hemostasis.  The right antecubital sheath venous sheath was removed and a bandage applied.  The patient left lab in stable condition.  TCS will be notified.  Heparin will be restarted without a bolus 4 hours after sheath removal. Quay Burow. MD, [facility] 09/07/2020 3:26 PM    Review of Systems  Constitutional: Positive for activity change, fatigue and unexpected weight change.  HENT: Negative.   Eyes: Negative.   Respiratory: Positive for chest tightness and shortness of breath.   Cardiovascular: Positive for chest pain and leg swelling.  Gastrointestinal: Negative.   Endocrine: Negative.   Genitourinary: Negative.   Musculoskeletal: Positive for arthralgias.  Skin: Negative.   Neurological: Negative for dizziness and syncope.  Hematological: Negative.   Psychiatric/Behavioral: Negative.    Blood pressure 129/75, pulse (!) 56, temperature 97.7 F (36.5  C), temperature source Oral, resp.  rate 19, height _0  (1.778 m), weight 118 kg, SpO2 97 %. Physical Exam Constitutional:      Appearance: He is well-developed. He is obese.  HENT:     Head: Normocephalic and atraumatic.     Mouth/Throat:     Mouth: Mucous membranes are moist.     Pharynx: Oropharynx is clear.  Eyes:     Conjunctiva/sclera: Conjunctivae normal.     Pupils: Pupils are equal, round, and reactive to light.  Neck:     Comments: Transmitted murmur or bruits on both sides of the neck Cardiovascular:     Rate and Rhythm: Normal rate. Rhythm irregular.     Pulses: Normal pulses.     Heart sounds: Normal heart sounds.     Comments: 3/6 systolic murmur along the right sternal border.  There is no diastolic murmur. Pulmonary:     Effort: Pulmonary effort is normal.     Breath sounds: Normal breath sounds.  Abdominal:     Tenderness: There is no abdominal tenderness.  Musculoskeletal:        General: Swelling present. Normal range of motion.     Cervical back: Normal range of motion and neck supple.  Skin:    General: Skin is warm and dry.  Neurological:     General: No focal deficit present.     Mental Status: He is alert and oriented to person, place, and time.  Psychiatric:        Mood and Affect: Mood normal.        Behavior: Behavior normal.    ECHOCARDIOGRAM REPORT       Patient Name:  MYER BOHLMAN Date of Exam: 09/06/2020  Medical Rec #: [id number]    Height:    70.0 in  Accession #:  [id number]    Weight:    255.0 lb  Date of Birth: Oct 14, 1945    BSA:     2.314 m  Patient Age:  36 years     BP:      136/75 mmHg  Patient Gender: M        HR:      46 bpm.  Exam Location: Inpatient   Procedure: 2D Echo, Cardiac Doppler and Color Doppler   Indications:  I42.9 Cardiomyopathy (unspecified)    History:    Patient has prior history of Echocardiogram examinations,  most         recent 11/16/2013. Stroke and Carotid  Disease, Aortic Valve         Disease, Arrythmias:Atrial Fibrillation,  Signs/Symptoms:Murmur         and Dyspnea; Risk Factors:Hypertension, Diabetes,  Dyslipidemia,         Former Smoker and Sleep Apnea. Thyroid Disease.    Sonographer:  Jonelle Sidle Dance  Referring Phys: [id number] [place]    1. Left ventricular ejection fraction, by estimation, is 45 to 50%. The  left ventricle has mildly decreased function. The left ventricle  demonstrates global hypokinesis. Left ventricular diastolic parameters are  indeterminate.  2. Right ventricular systolic function is normal. The right ventricular  size is normal. Tricuspid regurgitation signal is inadequate for assessing  PA pressure.  3. Left atrial size was moderately dilated.  4. Right atrial size was mildly dilated.  5. The mitral valve is normal in structure. Trivial mitral valve  regurgitation. No evidence of mitral stenosis. Moderate mitral annular  calcification.  6. The aortic valve is tricuspid. Aortic valve regurgitation  is not  visualized. Severe aortic valve stenosis. Aortic valve area, by VTI  measures 0.67 cm. Aortic valve mean gradient measures 35.0 mmHg.  7. Aortic dilatation noted. 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  8. The inferior vena cava is normal in size with <50% respiratory  variability, suggesting right atrial pressure of 8 mmHg.   FINDINGS  Left Ventricle: Left ventricular ejection fraction, by estimation, is 45  to 50%. The left ventricle has mildly decreased function. The left  ventricle demonstrates global hypokinesis. The left ventricular internal  cavity size was normal in size. There is  no left ventricular hypertrophy. Left ventricular diastolic parameters  are indeterminate.   Right Ventricle: The right ventricular size is normal. No increase in  right ventricular wall thickness. Right ventricular systolic  function is  normal. Tricuspid regurgitation signal is inadequate for assessing PA  pressure.   Left Atrium: Left atrial size was moderately dilated.   Right Atrium: Right atrial size was mildly dilated.   Pericardium: Trivial pericardial effusion is present.   Mitral Valve: The mitral valve is normal in structure. Moderate mitral  annular calcification. Trivial mitral valve regurgitation. No evidence of  mitral valve stenosis.   Tricuspid Valve: The tricuspid valve is normal in structure. Tricuspid  valve regurgitation is not demonstrated.   Aortic Valve: The aortic valve is tricuspid. Aortic valve regurgitation is  not visualized. Severe aortic stenosis is present. Aortic valve mean  gradient measures 35.0 mmHg. Aortic valve peak gradient measures 53.4  mmHg. Aortic valve area, by VTI measures  0.67 cm.   Pulmonic Valve: The pulmonic valve was normal in structure. Pulmonic valve  regurgitation is not visualized.   Aorta: Aortic dilatation noted. There is mild dilatation of the aortic  root, measuring 37 mm.   Venous: The inferior vena cava is normal in size with less than 50%  respiratory variability, suggesting right atrial pressure of 8 mmHg.   IAS/Shunts: No atrial level shunt detected by color flow Doppler.     LEFT VENTRICLE  PLAX 2D  LVIDd:     5.60 cm  LVIDs:     4.20 cm  LV PW:     1.40 cm  LV IVS:    1.00 cm  LVOT diam:   2.30 cm  LV SV:     69  LV SV Index:  30  LVOT Area:   4.15 cm     RIGHT VENTRICLE     IVC  RV Basal diam: 3.10 cm IVC diam: 2.00 cm  RV Mid diam:  1.40 cm  TAPSE (M-mode): 1.9 cm   LEFT ATRIUM       Index    RIGHT ATRIUM      Index  LA diam:    5.80 cm 2.51 cm/m RA Area:   25.30 cm  LA Vol (A2C):  161.0 ml 69.57 ml/m RA Volume:  82.90 ml 35.82 ml/m  LA Vol (A4C):  81.1 ml 35.04 ml/m  LA Biplane Vol: 116.0 ml 50.12 ml/m  AORTIC VALVE  AV Area (Vmax):  0.72  cm  AV Area (Vmean):  0.68 cm  AV Area (VTI):   0.67 cm  AV Vmax:      365.50 cm/s  AV Vmean:     255.500 cm/s  AV VTI:      1.027 m  AV Peak Grad:   53.4 mmHg  AV Mean Grad:   35.0 mmHg  LVOT Vmax:     63.20  cm/s  LVOT Vmean:    41.550 cm/s  LVOT VTI:     0.165 m  LVOT/AV VTI ratio: 0.16    AORTA  Ao Root diam: 3.70 cm  Ao Asc diam: 3.70 cm   MITRAL VALVE  MV Area (PHT): 2.24 cm   SHUNTS  MV Decel Time: 338 msec   Systemic VTI: 0.16 m  MV E velocity: 107.00 cm/s Systemic Diam: 2.30 cm   Loralie Champagne MD  Electronically signed by Loralie Champagne MD  Signature Date/Time: 09/06/2020/3:19:52 PM     Physicians  Panel Physicians Referring Physician Case Authorizing Physician  Lorretta Harp, MD (Primary)      Procedures  RIGHT/LEFT HEART CATH AND CORONARY ANGIOGRAPHY   Conclusion    Prox RCA lesion is 80% stenosed.  Mid RCA lesion is 80% stenosed.  Prox LAD lesion is 80% stenosed.  Hemodynamic findings consistent with aortic valve stenosis.   BURAK ZERBE is a 75 y.o. male    [id number] LOCATION:  FACILITY: [facility]  PHYSICIAN: Quay Burow, M.D. 11/02/1945   DATE OF PROCEDURE:  09/07/2020  DATE OF DISCHARGE:     CARDIAC CATHETERIZATION     History obtained from chart review.75 y.o.malewith past medical history of permanent atrial fibrillation, aortic stenosis, hypertension, diabetes mellitus, prior CVA, hyperlipidemia, obstructive sleep apnea, anemia admitted with chest pain and dyspnea and noted to have abnormal nuclear study as an outpatient. Echocardiogram shows ejection fraction 45 to 50%, biatrial enlargement, severe aortic stenosis with mean gradient 35 mmHg.    IMPRESSION: Mr. Barbier has severely calcified coronary arteries with severe stenosis in the LAD and RCA as well as severe aortic stenosis.  He will need CABG x2 and aortic valve replacement.  The radial sheath was  removed and a TR band was placed on the right wrist to achieve patent hemostasis.  The right antecubital sheath venous sheath was removed and a bandage applied.  The patient left lab in stable condition.  TCS will be notified.  Heparin will be restarted without a bolus 4 hours after sheath removal.  Quay Burow. MD, [facility] 09/07/2020 3:26 PM       Recommendations  Antiplatelet/Anticoag Recommend Aspirin 25m daily for moderate CAD.   Indications  Severe aortic stenosis [I35.0 (ICD-10-CM)]  Unstable angina (HCC) [I20.0 (ICD-10-CM)]   Procedural Details  Technical Details PROCEDURE DESCRIPTION:   The patient was brought to the second floor [facility] cath lab in the postabsorptive state. He was not premedicated . His right wrist and antecubital fossaWere prepped and shaved in usual sterile fashion. Xylocaine 1% was used or local anesthesia. A 6 French sheath was inserted into the right radial artery using standard Seldinger technique.  A 5 French sheath was inserted into the right antecubital vein.  A 5 French balloontipped Swan-Ganz catheter was then advanced through the right heart chambers obtaining sequential pressures and pulmonary artery blood samples for the determination of Fick cardiac output.  The patient received 4000 units  of heparin intravenously.  5 French right left Judkins diagnostic catheters were used for selective coronary angiography and obtaining left heart pressures.  Isovue dye was used for the entirety of the case.  Retrograde aortic and left ventricular and pullback pressures were recorded.  Radial cocktail was administered via the SideArm sheath.  Estimated blood loss <50 mL.   During this procedure no sedation was administered.   Medications (Filter: Administrations occurring from 1410 to 1526 on 09/07/20) (important) Continuous medications are totaled by the amount administered  until 09/07/20 1526.    Heparin (Porcine) in NaCl 1000-0.9  UT/500ML-% SOLN (mL) Total volume:  1,000 mL  Date/Time Rate/Dose/Volume Action   09/07/20 1424 500 mL Given   1424 500 mL Given    lidocaine (PF) (XYLOCAINE) 1 % injection (mL) Total volume:  3 mL  Date/Time Rate/Dose/Volume Action   09/07/20 1448 1 mL Given   1455 2 mL Given    Radial Cocktail (Verapamil 2.5 mg, NTG, Lidocaine) (mL) Total volume:  3 mL  Date/Time Rate/Dose/Volume Action   09/07/20 1456 3 mL Given    heparin sodium (porcine) injection (Units) Total dose:  6,000 Units  Date/Time Rate/Dose/Volume Action   09/07/20 1459 6,000 Units Given    iohexol (OMNIPAQUE) 350 MG/ML injection (mL) Total volume:  80 mL  Date/Time Rate/Dose/Volume Action   09/07/20 1523 80 mL Given    0.9 % sodium chloride infusion (mL) Total dose:  Cannot be calculated* Dosing weight:  115.7  *Administration dose not documented Date/Time Rate/Dose/Volume Action   09/07/20 1421 *Not included in total MAR Hold    acetaminophen (TYLENOL) tablet 650 mg (mg) Total dose:  Cannot be calculated* Dosing weight:  115.7  *Administration dose not documented Date/Time Rate/Dose/Volume Action   09/07/20 1421 *Not included in total MAR Hold    aspirin EC tablet 81 mg (mg) Total dose:  Cannot be calculated* Dosing weight:  115.7  *Administration dose not documented Date/Time Rate/Dose/Volume Action   09/07/20 1421 *Not included in total MAR Hold    atorvastatin (LIPITOR) tablet 40 mg (mg) Total dose:  Cannot be calculated*  *Administration dose not documented Date/Time Rate/Dose/Volume Action   09/07/20 1421 *Not included in total MAR Hold    insulin aspart (novoLOG) injection 0-20 Units (Units) Total dose:  Cannot be calculated* Dosing weight:  115.7  *Administration dose not documented Date/Time Rate/Dose/Volume Action   09/07/20 1421 *Not included in total MAR Hold    insulin aspart (novoLOG) injection 0-5 Units (Units) Total dose:  Cannot be calculated* Dosing weight:   115.7  *Administration dose not documented Date/Time Rate/Dose/Volume Action   09/07/20 1421 *Not included in total MAR Hold    insulin aspart (novoLOG) injection 6 Units (Units) Total dose:  Cannot be calculated* Dosing weight:  115.7  *Administration dose not documented Date/Time Rate/Dose/Volume Action   09/07/20 1421 *Not included in total MAR Hold    levothyroxine (SYNTHROID) tablet 224 mcg (mcg) Total dose:  Cannot be calculated*  *Administration dose not documented Date/Time Rate/Dose/Volume Action   09/07/20 1421 *Not included in total MAR Hold    losartan (COZAAR) tablet 100 mg (mg) Total dose:  Cannot be calculated*  *Administration dose not documented Date/Time Rate/Dose/Volume Action   09/07/20 1421 *Not included in total MAR Hold    ondansetron (ZOFRAN) injection 4 mg (mg) Total dose:  Cannot be calculated* Dosing weight:  115.7  *Administration dose not documented Date/Time Rate/Dose/Volume Action   09/07/20 1421 *Not included in total MAR Hold    sodium chloride flush (NS) 0.9 % injection 3 mL (mL) Total dose:  Cannot be calculated* Dosing weight:  115.7  *Administration dose not documented Date/Time Rate/Dose/Volume Action   09/07/20 1421 *Not included in total MAR Hold    sodium chloride flush (NS) 0.9 % injection 3 mL (mL) Total dose:  Cannot be calculated* Dosing weight:  115.7  *Administration dose not documented Date/Time Rate/Dose/Volume Action   09/07/20 1421 *Not included in total MAR Hold    amLODipine (NORVASC) tablet 5 mg (mg) Total dose:  Cannot be calculated* Dosing weight:  115.7  *Administration dose not documented Date/Time Rate/Dose/Volume Action   09/07/20 1421 *Not included in total MAR Hold    carvedilol (COREG) tablet 25 mg (mg) Total dose:  Cannot be calculated*  *Administration dose not documented Date/Time Rate/Dose/Volume Action   09/07/20 1421 *Not included in total MAR Hold    Contrast  Medication Name Total  Dose  iohexol (OMNIPAQUE) 350 MG/ML injection 80 mL    Radiation/Fluoro  Fluoro time: 9.5 (min) DAP: [id number] (mGycm2) Cumulative Air Kerma: 3419 (mGy)   Coronary Findings   Diagnostic Dominance: Right  Left Anterior Descending  Prox LAD lesion is 80% stenosed. The lesion is calcified.  Right Coronary Artery  Prox RCA lesion is 80% stenosed. The lesion is calcified.  Mid RCA lesion is 80% stenosed. The lesion is calcified.   Intervention   No interventions have been documented.  Right Heart  Right Heart Pressures Hemodynamic findings consistent with aortic valve stenosis. Right atrial pressure-17/17, mean 16 Right ventricular pressure-52/12 Pulmonary artery pressure-56/25, mean 35 Pulmonary wedge pressure-A-wave 30, V wave 48, mean 31 LVEDP-25 Aortic valve gradient-mean 25 Cardiac output-6.2 L/min with an index of 2.7 L/min/m Aortic valve area-1.26 cm with an index of 0.54 cm/m.   Coronary Diagrams   Diagnostic Dominance: Right    Intervention    Implants    No implant documentation for this case.   Syngo Images  Show images for CARDIAC CATHETERIZATION  Images on Long Term Storage  Show images for Jenesis, Suchy to Procedure Log  Procedure Log     Hemo Data  Flowsheet Row Most Recent Value  Fick Cardiac Output 6.2 L/min  Fick Cardiac Output Index 2.68 (L/min)/BSA  Aortic Mean Gradient 25.67 mmHg  Aortic Peak Gradient 17 mmHg  Aortic Valve Area 1.26  Aortic Value Area Index 0.54 cm2/BSA  RA A Wave 17 mmHg  RA V Wave 17 mmHg  RA Mean 16 mmHg  RV Systolic Pressure 52 mmHg  RV Diastolic Pressure 12 mmHg  RV EDP 16 mmHg  PA Systolic Pressure 56 mmHg  PA Diastolic Pressure 25 mmHg  PA Mean 35 mmHg  PW A Wave 30 mmHg  PW V Wave 48 mmHg  PW Mean 31 mmHg  AO Systolic Pressure 379 mmHg  AO Diastolic Pressure 76 mmHg  AO Mean 024 mmHg  LV Systolic Pressure 097 mmHg  LV Diastolic Pressure 22 mmHg  LV EDP 25 mmHg  AOp  Systolic Pressure 353 mmHg  AOp Diastolic Pressure 75 mmHg  AOp Mean Pressure 299 mmHg  LVp Systolic Pressure 242 mmHg  LVp Diastolic Pressure 22 mmHg  LVp EDP Pressure 27 mmHg  QP/QS 1  TPVR Index 13.07 HRUI  TSVR Index 38.13 HRUI  PVR SVR Ratio 0.05  TPVR/TSVR Ratio 0.34     Assessment/Plan:  This 75 year old gentleman has severe two-vessel coronary disease and severe aortic stenosis with New York Heart Association class III symptoms of exertional fatigue and shortness of breath consistent with chronic diastolic congestive heart failure in addition to exertional angina that is becoming more frequent.  His coronary arteries are heavily calcified and not amenable to PCI.  I agree that the best treatment for him is coronary bypass graft surgery and aortic valve replacement with a bioprosthetic valve as well as clipping of his left atrial appendage. I discussed the operative procedure with the patient and his wife including alternatives, benefits and risks; including but not limited to bleeding, blood transfusion, infection, stroke, myocardial  infarction, graft failure, heart block requiring a permanent pacemaker, organ dysfunction, and death.  Gevena Barre understands and agrees to proceed.  We will schedule surgery for Monday.  I spent 60 minutes performing this consultation and > 50% of this time was spent face to face counseling and coordinating the care of this patient's severe multivessel coronary disease and severe aortic stenosis.  Fernande Boyden Bartle 09/08/2020, 2:30 PM

## 2020-09-09 DIAGNOSIS — I200 Unstable angina: Secondary | ICD-10-CM | POA: Diagnosis not present

## 2020-09-09 DIAGNOSIS — I350 Nonrheumatic aortic (valve) stenosis: Secondary | ICD-10-CM | POA: Diagnosis not present

## 2020-09-09 LAB — BASIC METABOLIC PANEL
Anion gap: 8 (ref 5–15)
BUN: 15 mg/dL (ref 8–23)
CO2: 27 mmol/L (ref 22–32)
Calcium: 8.7 mg/dL — ABNORMAL LOW (ref 8.9–10.3)
Chloride: 104 mmol/L (ref 98–111)
Creatinine, Ser: 0.98 mg/dL (ref 0.61–1.24)
GFR, Estimated: >60 mL/min (ref >60)
Glucose, Bld: 150 mg/dL — ABNORMAL HIGH (ref 70–99)
Potassium: 3.3 mmol/L — ABNORMAL LOW (ref 3.5–5.1)
Sodium: 139 mmol/L (ref 135–145)

## 2020-09-09 LAB — GLUCOSE, CAPILLARY
Glucose-Capillary: 146 mg/dL — ABNORMAL HIGH (ref 70–99)
Glucose-Capillary: 208 mg/dL — ABNORMAL HIGH (ref 70–99)
Glucose-Capillary: 190 mg/dL — ABNORMAL HIGH (ref 70–99)
Glucose-Capillary: 149 mg/dL — ABNORMAL HIGH (ref 70–99)

## 2020-09-09 LAB — CBC
HCT: 29.1 % — ABNORMAL LOW (ref 39.0–52.0)
Hemoglobin: 8.5 g/dL — ABNORMAL LOW (ref 13.0–17.0)
MCH: 21.7 pg — ABNORMAL LOW (ref 26.0–34.0)
MCHC: 29.2 g/dL — ABNORMAL LOW (ref 30.0–36.0)
MCV: 74.4 fL — ABNORMAL LOW (ref 80.0–100.0)
Platelets: 149 10*3/uL — ABNORMAL LOW (ref 150–400)
RBC: 3.91 MIL/uL — ABNORMAL LOW (ref 4.22–5.81)
RDW: 17.5 % — ABNORMAL HIGH (ref 11.5–15.5)
WBC: 6.3 10*3/uL (ref 4.0–10.5)
nRBC: 0 % (ref 0.0–0.2)

## 2020-09-09 LAB — HEPARIN LEVEL (UNFRACTIONATED): Heparin Unfractionated: 0.4 IU/mL (ref 0.30–0.70)

## 2020-09-09 LAB — APTT: aPTT: 80 seconds — ABNORMAL HIGH (ref 24–36)

## 2020-09-09 MED ORDER — CARVEDILOL 3.125 MG PO TABS
3.1250 mg | ORAL_TABLET | Freq: Two times a day (BID) | ORAL | Status: DC
Start: 2020-09-09 — End: 2020-09-11
  Administered 2020-09-09 – 2020-09-10 (×3): 3.125 mg via ORAL
  Filled 2020-09-09 (×3): qty 1

## 2020-09-09 NOTE — Progress Notes (Signed)
ANTICOAGULATION CONSULT NOTE - Follow Up Consult  Pharmacy Consult for Heparin Indication: : atrial fibrillation and CAD   Allergies  Allergen Reactions  . Codeine Nausea And Vomiting  . Demerol Nausea And Vomiting    Patient Measurements: Height: 5\' 10"  (177.8 cm) Weight: 117.8 kg (259 lb 12.8 oz) IBW/kg (Calculated) : 73 Heparin Dosing Weight: 100 kg  Vital Signs: Temp: 98.4 F (36.9 C) (03/05 0337) Temp Source: Oral (03/05 0337) BP: 133/74 (03/05 0337) Pulse Rate: 62 (03/05 0337)  Labs: Recent Labs    09/07/20 0214 09/07/20 1456 09/07/20 1501 09/08/20 0357 09/08/20 1150 09/08/20 2045 09/09/20 0411  HGB 8.7*   < > 9.9* 8.3*  --   --  8.5*  HCT 30.2*   < > 29.0* 28.6*  --   --  29.1*  PLT 150  --   --  139*  --   --  149*  APTT 66*  --   --  64* 59* 63* 80*  HEPARINUNFRC 1.44*  --   --  0.60  --   --  0.40  CREATININE 0.83  --   --  0.78  --   --  0.98   < > = values in this interval not displayed.    Estimated Creatinine Clearance: 85 mL/min (by C-G formula based on SCr of 0.98 mg/dL).  Assessment: Heparin for afib with h/o CVA and new CAD on Eliquis PTA. Last dose 3/1 at 10am. Pharmacy has been consulted to dose heparin.    aPTT is therapeutic at 80 after rate increase to 1750 units/hr. Heparin level also therapeutic at 0.40. Levels are now correlating, will monitor heparin level only. H&H, PLTs stable. No bleeding reported. Planning for CABG and AVR 3/7.  Goal of Therapy:  Heparin level 0.3 - 0.7  aPTT 66-102 seconds Monitor platelets by anticoagulation protocol: Yes   Plan:  Continue heparin 1750 units/hr Daily HL and CBC Monitor s/s bleeding  5/7, PharmD PGY1 Acute Care Pharmacy Resident 09/09/2020 7:04 AM  Please check [email or web address] for unit specific pharmacy phone numbers.

## 2020-09-09 NOTE — Progress Notes (Signed)
Progress Note  Patient Name: Mason Patton Date of Encounter: 09/09/2020  [facility] Cardiologist: [name], MD   Subjective   Feeling better, less short of breath.  No chest pain overnight.  Did have some bradycardia yesterday into the 30s and 40s.  We will pull back on carvedilol.  Inpatient Medications    Scheduled Meds: . aspirin EC  81 mg Oral Daily  . atorvastatin  40 mg Oral QHS  . carvedilol  3.125 mg Oral BID  . [START ON 09/11/2020] epinephrine  0-10 mcg/min Intravenous To OR  . furosemide  40 mg Intravenous BID  . [START ON 09/11/2020] heparin-papaverine-plasmalyte irrigation   Irrigation To OR  . insulin aspart  0-20 Units Subcutaneous TID WC  . insulin aspart  0-5 Units Subcutaneous QHS  . insulin aspart  6 Units Subcutaneous TID WC  . [START ON 09/11/2020] insulin   Intravenous To OR  . levothyroxine  224 mcg Oral QAC breakfast  . losartan  100 mg Oral Daily  . [START ON 09/11/2020] magnesium sulfate  40 mEq Other To OR  . mupirocin ointment  1 application Nasal BID  . [START ON 09/11/2020] phenylephrine  30-200 mcg/min Intravenous To OR  . [START ON 09/11/2020] potassium chloride  80 mEq Other To OR  . sodium chloride flush  3 mL Intravenous Q12H  . [START ON 09/11/2020] tranexamic acid  15 mg/kg Intravenous To OR  . [START ON 09/11/2020] tranexamic acid  2 mg/kg Intracatheter To OR   Continuous Infusions: . sodium chloride    . [START ON 09/11/2020] cefUROXime (ZINACEF)  IV    . [START ON 09/11/2020] cefUROXime (ZINACEF)  IV    . [START ON 09/11/2020] dexmedetomidine    . [START ON 09/11/2020] heparin 30,000 units/NS 1000 mL solution for CELLSAVER    . heparin 1,750 Units/hr (09/08/20 2236)  . [START ON 09/11/2020] milrinone    . [START ON 09/11/2020] nitroGLYCERIN    . [START ON 09/11/2020] norepinephrine    . [START ON 09/11/2020] tranexamic acid (CYKLOKAPRON) infusion ([facility])     PRN Meds: sodium chloride, acetaminophen, ondansetron (ZOFRAN) IV, sodium chloride  flush   Vital Signs    Vitals:   09/08/20 1237 09/08/20 1916 09/09/20 0337 09/09/20 0822  BP: 129/75 (!) 154/82 133/74 137/77  Pulse: (!) 56 65 62 63  Resp:  20 19 (!) 21  Temp: 97.7 F (36.5 C) 97.7 F (36.5 C) 98.4 F (36.9 C) 97.7 F (36.5 C)  TempSrc: Oral Oral Oral Oral  SpO2: 97% 100% 98% 97%  Weight:   117.8 kg   Height:        Intake/Output Summary (Last 24 hours) at 09/09/2020 1016 Last data filed at 09/09/2020 6381 Gross per 24 hour  Intake 1320.17 ml  Output 1850 ml  Net -529.83 ml   Last 3 Weights 09/09/2020 09/08/2020 09/05/2020  Weight (lbs) 259 lb 12.8 oz 260 lb 3.2 oz 255 lb  Weight (kg) 117.845 kg 118.026 kg 115.667 kg      Telemetry    Arrhythmias, atrial fibrillation, bradycardia in the 30s yesterday morning.- Personally Reviewed  ECG    Atrial fibrillation- Personally Reviewed  Physical Exam   GEN: No acute distress.   Neck: No JVD Cardiac:  Irregular irregular with 3/6 systolic murmur, no rubs, or gallops.  Respiratory: Clear to auscultation bilaterally. GI: Soft, nontender, non-distended  MS: No edema; No deformity. Neuro:  Nonfocal  Psych: Normal affect   Labs    High  Sensitivity Troponin:   Recent Labs  Lab 09/05/20 2141 09/06/20 0043  TROPONINIHS 17 20*      Chemistry Recent Labs  Lab 09/05/20 2141 09/07/20 0214 09/07/20 1456 09/07/20 1501 09/08/20 0357 09/09/20 0411  NA 141 139   < > 141 139 139  K 3.5 3.5   < > 3.8 3.6 3.3*  CL 103 101  --   --  104 104  CO2 28 28  --   --  24 27  GLUCOSE 240* 107*  --   --  122* 150*  BUN 21 18  --   --  18 15  CREATININE 0.94 0.83  --   --  0.78 0.98  CALCIUM 9.2 9.0  --   --  8.7* 8.7*  PROT 6.1*  --   --   --   --   --   ALBUMIN 3.5  --   --   --   --   --   AST 23  --   --   --   --   --   ALT 21  --   --   --   --   --   ALKPHOS 86  --   --   --   --   --   BILITOT 1.1  --   --   --   --   --   GFRNONAA >60 >60  --   --  >60 >60  ANIONGAP 10 10  --   --  11 8   < > = values  in this interval not displayed.     Hematology Recent Labs  Lab 09/07/20 0214 09/07/20 1456 09/07/20 1501 09/08/20 0357 09/09/20 0411  WBC 6.5  --   --  5.9 6.3  RBC 3.99*  --   --  3.85* 3.91*  HGB 8.7*   < > 9.9* 8.3* 8.5*  HCT 30.2*   < > 29.0* 28.6* 29.1*  MCV 75.7*  --   --  74.3* 74.4*  MCH 21.8*  --   --  21.6* 21.7*  MCHC 28.8*  --   --  29.0* 29.2*  RDW 17.5*  --   --  17.5* 17.5*  PLT 150  --   --  139* 149*   < > = values in this interval not displayed.    BNP Recent Labs  Lab 09/05/20 1651 09/06/20 0703  BNP  --  803.0*  PROBNP 3,817*  --      DDimer No results for input(s): DDIMER in the last 168 hours.   Radiology    CARDIAC CATHETERIZATION  Result Date: 09/07/2020  Prox RCA lesion is 80% stenosed.  Mid RCA lesion is 80% stenosed.  Prox LAD lesion is 80% stenosed.  Hemodynamic findings consistent with aortic valve stenosis.  Mason Patton is a 75 y.o. male  [id number] LOCATION:  FACILITY: [facility] PHYSICIAN: Nanetta Batty, M.D. [birth year] DATE OF PROCEDURE:  09/07/2020 DATE OF DISCHARGE: CARDIAC CATHETERIZATION History obtained from chart review.75 y.o. male with past medical history of permanent atrial fibrillation, aortic stenosis, hypertension, diabetes mellitus, prior CVA, hyperlipidemia, obstructive sleep apnea, anemia admitted with chest pain and dyspnea and noted to have abnormal nuclear study as an outpatient.  Echocardiogram shows ejection fraction 45 to 50%, biatrial enlargement, severe aortic stenosis with mean gradient 35 mmHg.   [name] has severely calcified coronary arteries with severe stenosis in the LAD and RCA as well as severe aortic stenosis.  He will  need CABG x2 and aortic valve replacement.  The radial sheath was removed and a TR band was placed on the right wrist to achieve patent hemostasis.  The right antecubital sheath venous sheath was removed and a bandage applied.  The patient left lab in stable condition.  TCS will be notified.   Heparin will be restarted without a bolus 4 hours after sheath removal. Nanetta Batty. MD, [facility] 09/07/2020 3:26 PM   VAS US DOPPLER PRE CABG  Result Date: 09/08/2020 PREOPERATIVE VASCULAR EVALUATION  Indications:      Pre-CABG. Risk Factors:     Hypertension. Comparison Study: No prior studies. Performing Technologist: Olen Cordial RVT  Examination Guidelines: A complete evaluation includes B-mode imaging, spectral Doppler, color Doppler, and power Doppler as needed of all accessible portions of each vessel. Bilateral testing is considered an integral part of a complete examination. Limited examinations for reoccurring indications may be performed as noted.  Right Carotid Findings: +----------+--------+-------+--------+--------------------------------+--------+           PSV cm/sEDV    StenosisDescribe                        Comments                   cm/s                                                    +----------+--------+-------+--------+--------------------------------+--------+ CCA Prox  54      9              smooth and heterogenous                  +----------+--------+-------+--------+--------------------------------+--------+ CCA Distal49      11             smooth, heterogenous and                                                  calcific                                 +----------+--------+-------+--------+--------------------------------+--------+ ICA Prox  162     44     40-59%  calcific                                 +----------+--------+-------+--------+--------------------------------+--------+ ICA Mid   180     64     40-59%  smooth and heterogenous                  +----------+--------+-------+--------+--------------------------------+--------+ ICA Distal63      12                                             tortuous +----------+--------+-------+--------+--------------------------------+--------+ ECA       81      10                                                       +----------+--------+-------+--------+--------------------------------+--------+  Portions of this table do not appear on this page. +----------+--------+-------+--------+------------+           PSV cm/sEDV cmsDescribeArm Pressure +----------+--------+-------+--------+------------+ [id number]                                  +----------+--------+-------+--------+------------+ +---------+--------+--+--------+-+---------+ VertebralPSV cm/s38EDV cm/s6Antegrade +---------+--------+--+--------+-+---------+ Left Carotid Findings: +----------+--------+--------+--------+-----------------------+--------+           PSV cm/sEDV cm/sStenosisDescribe               Comments +----------+--------+--------+--------+-----------------------+--------+ CCA Prox  65      10              smooth and heterogenous         +----------+--------+--------+--------+-----------------------+--------+ CCA Distal41      10              smooth and heterogenous         +----------+--------+--------+--------+-----------------------+--------+ ICA Prox  70      12              calcific                        +----------+--------+--------+--------+-----------------------+--------+ ICA Mid   86      23              smooth and heterogenous         +----------+--------+--------+--------+-----------------------+--------+ ICA Distal80      18                                     tortuous +----------+--------+--------+--------+-----------------------+--------+ ECA       66      6                                               +----------+--------+--------+--------+-----------------------+--------+ +----------+--------+--------+--------+------------+ SubclavianPSV cm/sEDV cm/sDescribeArm Pressure +----------+--------+--------+--------+------------+           76                                   +----------+--------+--------+--------+------------+  +---------+--------+--+--------+--+---------+ VertebralPSV cm/s62EDV cm/s19Antegrade +---------+--------+--+--------+--+---------+  ABI Findings: +--------+------------------+-----+---------+--------+ Right   Rt Pressure (mmHg)IndexWaveform Comment  +--------+------------------+-----+---------+--------+ [id number]                    triphasic         +--------+------------------+-----+---------+--------+ PTA     184               1.18 triphasic         +--------+------------------+-----+---------+--------+ DP      162               1.04 triphasic         +--------+------------------+-----+---------+--------+ +--------+------------------+-----+---------+-------+ Left    Lt Pressure (mmHg)IndexWaveform Comment +--------+------------------+-----+---------+-------+ [id number]                    triphasic        +--------+------------------+-----+---------+-------+ PTA     183               1.17 triphasic        +--------+------------------+-----+---------+-------+ DP      181  1.16 triphasic        +--------+------------------+-----+---------+-------+ +-------+---------------+----------------+ ABI/TBIToday's ABI/TBIPrevious ABI/TBI +-------+---------------+----------------+ Right  1.18                            +-------+---------------+----------------+ Left   1.17                            +-------+---------------+----------------+  Right Doppler Findings: +--------+--------+-----+---------+--------+ Site    PressureIndexDoppler  Comments +--------+--------+-----+---------+--------+ [id number]          triphasic         +--------+--------+-----+---------+--------+ Radial               triphasic         +--------+--------+-----+---------+--------+ Ulnar                triphasic         +--------+--------+-----+---------+--------+  Left Doppler Findings: +--------+--------+-----+---------+--------+ Site     PressureIndexDoppler  Comments +--------+--------+-----+---------+--------+ [id number]          triphasic         +--------+--------+-----+---------+--------+ Radial               triphasic         +--------+--------+-----+---------+--------+ Ulnar                triphasic         +--------+--------+-----+---------+--------+  Summary: Right Carotid: Velocities in the right ICA are consistent with a 40-59%                stenosis. Left Carotid: Velocities in the left ICA are consistent with a 1-39% stenosis. Vertebrals: Bilateral vertebral arteries demonstrate antegrade flow. Right ABI: Resting right ankle-brachial index is within normal range. No evidence of significant right lower extremity arterial disease. Left ABI: Resting left ankle-brachial index is within normal range. No evidence of significant left lower extremity arterial disease. Right Upper Extremity: Doppler waveform obliterate with right radial compression. Doppler waveforms remain within normal limits with right ulnar compression. Left Upper Extremity: Doppler waveform obliterate with left radial compression. Doppler waveforms remain within normal limits with left ulnar compression.  Electronically signed by Heath Larkhomas Hawken on 09/08/2020 at 4:51:22 PM.    Final     Cardiac Studies   Cath with severe multivessel coronary artery disease echo with aortic stenosis  Patient Profile     75 y.o. male severe coronary artery disease, severe aortic stenosis  Assessment & Plan    Severe coronary artery disease Severe aortic stenosis -CABG aortic valve replacement Monday Dr. Laneta SimmersBartle -Left atrial appendage clipping as well.  Unstable angina -Aspirin statin heparin beta-blocker.  Essential hypertension -Stable.  Holding amlodipine.  ARB, beta-blocker.  Permanent atrial fibrillation -Beta-blocker for rate control, bradycardia noted, we will pull back on carvedilol and give him 3.125 mg twice a day.  Eliquis currently on  hold.  IV heparin. -Plan on left atrial appendage clipping.  Acute on chronic diastolic heart failure -Wedge pressure 31 at cath.  Lasix 40 mg twice a day.  Creatinine stable.  Obstructive sleep apnea -CPAP.  For questions or updates, please contact [facility] Please consult [email or web address] for contact info under        Signed, Donato SchultzMark Doctor Sheahan, MD  09/09/2020, 10:16 AM

## 2020-09-09 NOTE — Progress Notes (Signed)
CARDIAC REHAB PHASE I   PRE:  Rate/Rhythm: 66 Afib  BP:  Supine:   Sitting:130  Standing:    SaO2: 98 RA  MODE:  Ambulation: 270 ft   POST:  Rate/Rhythm: 79 Afib  BP:  Supine:   Sitting: 160/80  Standing:    SaO2: 100 RA 1130-1230 Assisted X1 to ambulate. Gait steady. 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 Pt tolerated ambulation well without c/o but noted he was SOB by end of walk with RA sat of 100%. Pt back to bed after walk with call light in reach. Completed pre-op [facility] education with pt and his son. We discussed sternal precautions, walking post op, and what to expect before and after surgery. I gave pt IS and instructed proper use. He was able to achieve 1750 ml.I gave pt preparing for heart surgery booklet and pt care guide. I instructed them how to watch [facility] video on TV. I  would recommend  a Physical Therapy post-op due to him using his arms to get up. He states that his wife will be able to care for him after surgery and that she will be with him 24/7.  Melina Copa RN 09/09/2020 12:38 PM

## 2020-09-10 ENCOUNTER — Inpatient Hospital Stay (HOSPITAL_COMMUNITY): Payer: No Typology Code available for payment source

## 2020-09-10 DIAGNOSIS — I350 Nonrheumatic aortic (valve) stenosis: Secondary | ICD-10-CM | POA: Diagnosis not present

## 2020-09-10 LAB — URINALYSIS, ROUTINE W REFLEX MICROSCOPIC
Bilirubin Urine: NEGATIVE
Glucose, UA: NEGATIVE mg/dL
Hgb urine dipstick: NEGATIVE
Ketones, ur: NEGATIVE mg/dL
Leukocytes,Ua: NEGATIVE
Nitrite: NEGATIVE
Protein, ur: NEGATIVE mg/dL
Specific Gravity, Urine: 1.014 (ref 1.005–1.030)
pH: 5 (ref 5.0–8.0)

## 2020-09-10 LAB — BASIC METABOLIC PANEL
Anion gap: 9 (ref 5–15)
BUN: 15 mg/dL (ref 8–23)
CO2: 26 mmol/L (ref 22–32)
Calcium: 8.6 mg/dL — ABNORMAL LOW (ref 8.9–10.3)
Chloride: 105 mmol/L (ref 98–111)
Creatinine, Ser: 0.83 mg/dL (ref 0.61–1.24)
GFR, Estimated: >60 mL/min (ref >60)
Glucose, Bld: 160 mg/dL — ABNORMAL HIGH (ref 70–99)
Potassium: 3.2 mmol/L — ABNORMAL LOW (ref 3.5–5.1)
Sodium: 140 mmol/L (ref 135–145)

## 2020-09-10 LAB — CBC
HCT: 27.3 % — ABNORMAL LOW (ref 39.0–52.0)
Hemoglobin: 8.4 g/dL — ABNORMAL LOW (ref 13.0–17.0)
MCH: 22.4 pg — ABNORMAL LOW (ref 26.0–34.0)
MCHC: 30.8 g/dL (ref 30.0–36.0)
MCV: 72.8 fL — ABNORMAL LOW (ref 80.0–100.0)
Platelets: 142 10*3/uL — ABNORMAL LOW (ref 150–400)
RBC: 3.75 MIL/uL — ABNORMAL LOW (ref 4.22–5.81)
RDW: 17.6 % — ABNORMAL HIGH (ref 11.5–15.5)
WBC: 6.8 10*3/uL (ref 4.0–10.5)
nRBC: 0 % (ref 0.0–0.2)

## 2020-09-10 LAB — HEPARIN LEVEL (UNFRACTIONATED)
Heparin Unfractionated: 0.29 IU/mL — ABNORMAL LOW (ref 0.30–0.70)
Heparin Unfractionated: 0.3 IU/mL (ref 0.30–0.70)

## 2020-09-10 LAB — GLUCOSE, CAPILLARY
Glucose-Capillary: 140 mg/dL — ABNORMAL HIGH (ref 70–99)
Glucose-Capillary: 195 mg/dL — ABNORMAL HIGH (ref 70–99)
Glucose-Capillary: 195 mg/dL — ABNORMAL HIGH (ref 70–99)
Glucose-Capillary: 216 mg/dL — ABNORMAL HIGH (ref 70–99)

## 2020-09-10 LAB — SURGICAL PCR SCREEN
MRSA, PCR: NEGATIVE
Staphylococcus aureus: NEGATIVE

## 2020-09-10 MED ORDER — TEMAZEPAM 15 MG PO CAPS: 15.0000 mg | ORAL_CAPSULE | Freq: Once | ORAL | Status: DC | PRN

## 2020-09-10 MED ORDER — CHLORHEXIDINE GLUCONATE CLOTH 2 % EX PADS
6.0000 | MEDICATED_PAD | Freq: Once | CUTANEOUS | Status: AC
  Administered 2020-09-11: 6 via TOPICAL

## 2020-09-10 MED ORDER — CHLORHEXIDINE GLUCONATE CLOTH 2 % EX PADS
6.0000 | MEDICATED_PAD | Freq: Once | CUTANEOUS | Status: AC
  Administered 2020-09-10: 6 via TOPICAL

## 2020-09-10 MED ORDER — METOPROLOL TARTRATE 12.5 MG HALF TABLET
12.5000 mg | ORAL_TABLET | Freq: Once | ORAL | Status: AC
  Administered 2020-09-11: 12.5 mg via ORAL
  Filled 2020-09-10: qty 1

## 2020-09-10 MED ORDER — VANCOMYCIN HCL 1500 MG/300ML IV SOLN
1500.0000 mg | INTRAVENOUS | Status: AC
  Administered 2020-09-11: 1500 mg via INTRAVENOUS
  Filled 2020-09-10: qty 300

## 2020-09-10 MED ORDER — CHLORHEXIDINE GLUCONATE 0.12 % MT SOLN
15.0000 mL | Freq: Once | OROMUCOSAL | Status: AC
  Administered 2020-09-11: 15 mL via OROMUCOSAL
  Filled 2020-09-10: qty 15

## 2020-09-10 MED ORDER — BISACODYL 5 MG PO TBEC
5.0000 mg | DELAYED_RELEASE_TABLET | Freq: Once | ORAL | Status: AC
  Administered 2020-09-10: 5 mg via ORAL
  Filled 2020-09-10: qty 1

## 2020-09-10 MED ORDER — POTASSIUM CHLORIDE CRYS ER 20 MEQ PO TBCR
40.0000 meq | EXTENDED_RELEASE_TABLET | Freq: Two times a day (BID) | ORAL | Status: DC
  Administered 2020-09-10 (×2): 40 meq via ORAL
  Filled 2020-09-10 (×2): qty 2

## 2020-09-10 MED ORDER — DIAZEPAM 5 MG PO TABS
5.0000 mg | ORAL_TABLET | Freq: Once | ORAL | Status: AC
  Administered 2020-09-11: 5 mg via ORAL
  Filled 2020-09-10: qty 1

## 2020-09-10 NOTE — Anesthesia Preprocedure Evaluation (Signed)
Anesthesia Evaluation  Patient identified by MRN, date of birth, ID band Patient awake    Reviewed: Allergy & Precautions, NPO status , Patient's Chart, lab work & pertinent test results  Airway Mallampati: II  TM Distance: >3 FB Neck ROM: Full    Dental no notable dental hx.    Pulmonary sleep apnea , COPD,  COPD inhaler, Current Smoker and Patient abstained from smoking.,    Pulmonary exam normal breath sounds clear to auscultation       Cardiovascular hypertension, Pt. on medications and Pt. on home beta blockers + CAD  + dysrhythmias Atrial Fibrillation + Valvular Problems/Murmurs AS  Rhythm:Regular Rate:Normal + Systolic murmurs    Neuro/Psych CVA negative psych ROS   GI/Hepatic negative GI ROS, Neg liver ROS,   Endo/Other  diabetes, Oral Hypoglycemic AgentsHypothyroidism   Renal/GU negative Renal ROS     Musculoskeletal  (+) Arthritis ,   Abdominal (+) + obese,   Peds  Hematology  (+) anemia , HLD   Anesthesia Other Findings CAD  AS  Reproductive/Obstetrics                            Anesthesia Physical Anesthesia Plan  ASA: IV  Anesthesia Plan: General   Post-op Pain Management:    Induction: Intravenous  PONV Risk Score and Plan: 1 and Ondansetron, Dexamethasone, Midazolam and Treatment may vary due to age or medical condition  Airway Management Planned: Oral ETT  Additional Equipment: Arterial line, CVP, PA Cath, TEE and Ultrasound Guidance Line Placement  Intra-op Plan:   Post-operative Plan: Post-operative intubation/ventilation  Informed Consent: I have reviewed the patients History and Physical, chart, labs and discussed the procedure including the risks, benefits and alternatives for the proposed anesthesia with the patient or authorized representative who has indicated his/her understanding and acceptance.     Dental advisory given  Plan Discussed with:  CRNA  Anesthesia Plan Comments:        Anesthesia Quick Evaluation

## 2020-09-10 NOTE — Progress Notes (Addendum)
Progress Note  Patient Name: Mason Patton Date of Encounter: 09/10/2020  [facility] Cardiologist: [name], MD   Subjective   Feeling better, less short of breath.  No chest pain overnight.  Did have some bradycardia on 09/08/2020 into the 30s and 40s.  We decreased the carvedilol.  He states that he urinated quite a bit between 8 and 10 PM yesterday.  His wife and son were in the room.  Inpatient Medications    Scheduled Meds: . aspirin EC  81 mg Oral Daily  . atorvastatin  40 mg Oral QHS  . carvedilol  3.125 mg Oral BID  . [START ON 09/11/2020] epinephrine  0-10 mcg/min Intravenous To OR  . furosemide  40 mg Intravenous BID  . [START ON 09/11/2020] heparin-papaverine-plasmalyte irrigation   Irrigation To OR  . insulin aspart  0-20 Units Subcutaneous TID WC  . insulin aspart  0-5 Units Subcutaneous QHS  . insulin aspart  6 Units Subcutaneous TID WC  . [START ON 09/11/2020] insulin   Intravenous To OR  . levothyroxine  224 mcg Oral QAC breakfast  . losartan  100 mg Oral Daily  . [START ON 09/11/2020] magnesium sulfate  40 mEq Other To OR  . mupirocin ointment  1 application Nasal BID  . [START ON 09/11/2020] phenylephrine  30-200 mcg/min Intravenous To OR  . [START ON 09/11/2020] potassium chloride  80 mEq Other To OR  . sodium chloride flush  3 mL Intravenous Q12H  . [START ON 09/11/2020] tranexamic acid  15 mg/kg Intravenous To OR  . [START ON 09/11/2020] tranexamic acid  2 mg/kg Intracatheter To OR   Continuous Infusions: . sodium chloride    . [START ON 09/11/2020] cefUROXime (ZINACEF)  IV    . [START ON 09/11/2020] cefUROXime (ZINACEF)  IV    . [START ON 09/11/2020] dexmedetomidine    . [START ON 09/11/2020] heparin 30,000 units/NS 1000 mL solution for CELLSAVER    . heparin 1,850 Units/hr (09/10/20 1107)  . [START ON 09/11/2020] milrinone    . [START ON 09/11/2020] nitroGLYCERIN    . [START ON 09/11/2020] norepinephrine    . [START ON 09/11/2020] tranexamic acid (CYKLOKAPRON)  infusion ([facility])    . [START ON 09/11/2020] vancomycin     PRN Meds: sodium chloride, acetaminophen, ondansetron (ZOFRAN) IV, sodium chloride flush   Vital Signs    Vitals:   09/09/20 1926 09/10/20 0010 09/10/20 0241 09/10/20 0334  BP: (!) 144/78   (!) 153/64  Pulse: (!) 57   95  Resp: 18   18  Temp: 97.8 F (36.6 C)   98.1 F (36.7 C)  TempSrc: Oral   Oral  SpO2: 99%   98%  Weight:  117.4 kg 117.4 kg   Height:        Intake/Output Summary (Last 24 hours) at 09/10/2020 1122 Last data filed at 09/10/2020 0626 Gross per 24 hour  Intake 991.43 ml  Output 2205 ml  Net -1213.57 ml   Last 3 Weights 09/10/2020 09/10/2020 09/09/2020  Weight (lbs) 258 lb 13.1 oz 258 lb 13.1 oz 259 lb 12.8 oz  Weight (kg) 117.4 kg 117.4 kg 117.845 kg      Telemetry    Atrial fibrillation, heart rate improved from yesterday.- Personally Reviewed  ECG    Atrial fibrillation- Personally Reviewed  Physical Exam   GEN: Well nourished, well developed, in no acute distress  HEENT: normal  Neck: no JVD, carotid bruits, or masses Cardiac:irreg; 3/6 SM, no rubs,  or gallops,no edema  Respiratory:  clear to auscultation bilaterally, normal work of breathing GI: soft, nontender, nondistended, + BS MS: no deformity or atrophy  Skin: warm and dry, no rash Neuro:  Alert and Oriented x 3, Strength and sensation are intact Psych: euthymic mood, full affect   Labs    High Sensitivity Troponin:   Recent Labs  Lab 09/05/20 2141 09/06/20 0043  TROPONINIHS 17 20*      Chemistry Recent Labs  Lab 09/05/20 2141 09/07/20 0214 09/08/20 0357 09/09/20 0411 09/10/20 0258  NA 141   < > 139 139 140  K 3.5   < > 3.6 3.3* 3.2*  CL 103   < > 104 104 105  CO2 28   < > 24 27 26   GLUCOSE 240*   < > 122* 150* 160*  BUN 21   < > 18 15 15   CREATININE 0.94   < > 0.78 0.98 0.83  CALCIUM 9.2   < > 8.7* 8.7* 8.6*  PROT 6.1*  --   --   --   --   ALBUMIN 3.5  --   --   --   --   AST 23  --   --   --   --   ALT 21  --    --   --   --   ALKPHOS 86  --   --   --   --   BILITOT 1.1  --   --   --   --   GFRNONAA >60   < > >60 >60 >60  ANIONGAP 10   < > 11 8 9    < > = values in this interval not displayed.     Hematology Recent Labs  Lab 09/08/20 0357 09/09/20 0411 09/10/20 0258  WBC 5.9 6.3 6.8  RBC 3.85* 3.91* 3.75*  HGB 8.3* 8.5* 8.4*  HCT 28.6* 29.1* 27.3*  MCV 74.3* 74.4* 72.8*  MCH 21.6* 21.7* 22.4*  MCHC 29.0* 29.2* 30.8  RDW 17.5* 17.5* 17.6*  PLT 139* 149* 142*    BNP Recent Labs  Lab 09/05/20 1651 09/06/20 0703  BNP  --  803.0*  PROBNP 3,817*  --      DDimer No results for input(s): DDIMER in the last 168 hours.   Radiology    VAS 11/10/20 DOPPLER PRE CABG  Result Date: 09/08/2020 PREOPERATIVE VASCULAR EVALUATION  Indications:      Pre-CABG. Risk Factors:     Hypertension. Comparison Study: No prior studies. Performing Technologist: 11/06/20 RVT  Examination Guidelines: A complete evaluation includes B-mode imaging, spectral Doppler, color Doppler, and power Doppler as needed of all accessible portions of each vessel. Bilateral testing is considered an integral part of a complete examination. Limited examinations for reoccurring indications may be performed as noted.  Right Carotid Findings: +----------+--------+-------+--------+--------------------------------+--------+           PSV cm/sEDV    StenosisDescribe                        Comments                   cm/s                                                    +----------+--------+-------+--------+--------------------------------+--------+ CCA Prox  54      9              smooth and heterogenous                  +----------+--------+-------+--------+--------------------------------+--------+ CCA Distal49      11             smooth, heterogenous and                                                  calcific                                  +----------+--------+-------+--------+--------------------------------+--------+ ICA Prox  162     44     40-59%  calcific                                 +----------+--------+-------+--------+--------------------------------+--------+ ICA Mid   180     64     40-59%  smooth and heterogenous                  +----------+--------+-------+--------+--------------------------------+--------+ ICA Distal63      12                                             tortuous +----------+--------+-------+--------+--------------------------------+--------+ ECA       81      10                                                      +----------+--------+-------+--------+--------------------------------+--------+ Portions of this table do not appear on this page. +----------+--------+-------+--------+------------+           PSV cm/sEDV cmsDescribeArm Pressure +----------+--------+-------+--------+------------+ [id number]                                  +----------+--------+-------+--------+------------+ +---------+--------+--+--------+-+---------+ VertebralPSV cm/s38EDV cm/s6Antegrade +---------+--------+--+--------+-+---------+ Left Carotid Findings: +----------+--------+--------+--------+-----------------------+--------+           PSV cm/sEDV cm/sStenosisDescribe               Comments +----------+--------+--------+--------+-----------------------+--------+ CCA Prox  65      10              smooth and heterogenous         +----------+--------+--------+--------+-----------------------+--------+ CCA Distal41      10              smooth and heterogenous         +----------+--------+--------+--------+-----------------------+--------+ ICA Prox  70      12              calcific                        +----------+--------+--------+--------+-----------------------+--------+ ICA Mid   86      23              smooth and heterogenous          +----------+--------+--------+--------+-----------------------+--------+  ICA Distal80      18                                     tortuous +----------+--------+--------+--------+-----------------------+--------+ ECA       66      6                                               +----------+--------+--------+--------+-----------------------+--------+ +----------+--------+--------+--------+------------+ SubclavianPSV cm/sEDV cm/sDescribeArm Pressure +----------+--------+--------+--------+------------+           76                                   +----------+--------+--------+--------+------------+ +---------+--------+--+--------+--+---------+ VertebralPSV cm/s62EDV cm/s19Antegrade +---------+--------+--+--------+--+---------+  ABI Findings: +--------+------------------+-----+---------+--------+ Right   Rt Pressure (mmHg)IndexWaveform Comment  +--------+------------------+-----+---------+--------+ [id number]                    triphasic         +--------+------------------+-----+---------+--------+ PTA     184               1.18 triphasic         +--------+------------------+-----+---------+--------+ DP      162               1.04 triphasic         +--------+------------------+-----+---------+--------+ +--------+------------------+-----+---------+-------+ Left    Lt Pressure (mmHg)IndexWaveform Comment +--------+------------------+-----+---------+-------+ [id number]                    triphasic        +--------+------------------+-----+---------+-------+ PTA     183               1.17 triphasic        +--------+------------------+-----+---------+-------+ DP      181               1.16 triphasic        +--------+------------------+-----+---------+-------+ +-------+---------------+----------------+ ABI/TBIToday's ABI/TBIPrevious ABI/TBI +-------+---------------+----------------+ Right  1.18                             +-------+---------------+----------------+ Left   1.17                            +-------+---------------+----------------+  Right Doppler Findings: +--------+--------+-----+---------+--------+ Site    PressureIndexDoppler  Comments +--------+--------+-----+---------+--------+ [id number]          triphasic         +--------+--------+-----+---------+--------+ Radial               triphasic         +--------+--------+-----+---------+--------+ Ulnar                triphasic         +--------+--------+-----+---------+--------+  Left Doppler Findings: +--------+--------+-----+---------+--------+ Site    PressureIndexDoppler  Comments +--------+--------+-----+---------+--------+ [id number]          triphasic         +--------+--------+-----+---------+--------+ Radial               triphasic         +--------+--------+-----+---------+--------+ Ulnar                triphasic         +--------+--------+-----+---------+--------+  Summary: Right Carotid: Velocities in the right ICA are consistent with a 40-59%                stenosis. Left Carotid: Velocities in the left ICA are consistent with a 1-39% stenosis. Vertebrals: Bilateral vertebral arteries demonstrate antegrade flow. Right ABI: Resting right ankle-brachial index is within normal range. No evidence of significant right lower extremity arterial disease. Left ABI: Resting left ankle-brachial index is within normal range. No evidence of significant left lower extremity arterial disease. Right Upper Extremity: Doppler waveform obliterate with right radial compression. Doppler waveforms remain within normal limits with right ulnar compression. Left Upper Extremity: Doppler waveform obliterate with left radial compression. Doppler waveforms remain within normal limits with left ulnar compression.  Electronically signed by Heath Lark on 09/08/2020 at 4:51:22 PM.    Final     Cardiac Studies   Cath with severe  multivessel coronary artery disease echo with aortic stenosis  Patient Profile     75 y.o. male severe coronary artery disease, severe aortic stenosis  Assessment & Plan    Severe coronary artery disease Severe aortic stenosis -CABG aortic valve replacement Monday Dr. Laneta Simmers -Left atrial appendage clipping as well.  Unstable angina -Aspirin statin heparin beta-blocker.  Essential hypertension -Stable.  Holding amlodipine.  ARB, decreased beta-blocker, carvedilol to 3.125 because of prior bradycardia noted.  Permanent atrial fibrillation -Beta-blocker for rate control, bradycardia noted, we will pulled back on carvedilol to 3.125 mg twice a day.  Eliquis currently on hold.  IV heparin. -Plan on left atrial appendage clipping.  Acute on chronic diastolic heart failure -Wedge pressure 31 at cath.  Lasix 40 mg twice a day.  Creatinine stable.  Hypokalemia -We will replete potassium  Obstructive sleep apnea -CPAP.  For questions or updates, please contact [facility] Please consult [email or web address] for contact info under        Signed, Donato Schultz, MD  09/10/2020, 11:22 AM

## 2020-09-10 NOTE — Progress Notes (Signed)
ANTICOAGULATION CONSULT NOTE - Follow Up Consult  Pharmacy Consult for Heparin Indication: : atrial fibrillation and CAD   Allergies  Allergen Reactions  . Codeine Nausea And Vomiting  . Demerol Nausea And Vomiting    Patient Measurements: Height: 5\' 10"  (177.8 cm) Weight: 117.4 kg (258 lb 13.1 oz) IBW/kg (Calculated) : 73 Heparin Dosing Weight: 100 kg  Vital Signs: Temp: 98 F (36.7 C) (03/06 1137) Temp Source: Oral (03/06 1137) BP: 147/74 (03/06 1137) Pulse Rate: 67 (03/06 1137)  Labs: Recent Labs    09/08/20 0357 09/08/20 1150 09/08/20 2045 09/09/20 0411 09/10/20 0258 09/10/20 1440  HGB 8.3*  --   --  8.5* 8.4*  --   HCT 28.6*  --   --  29.1* 27.3*  --   PLT 139*  --   --  149* 142*  --   APTT 64* 59* 63* 80*  --   --   HEPARINUNFRC 0.60  --   --  0.40 0.29* 0.30  CREATININE 0.78  --   --  0.98 0.83  --     Estimated Creatinine Clearance: 100.3 mL/min (by C-G formula based on SCr of 0.83 mg/dL).  Assessment: Heparin for afib with h/o CVA and new CAD on Eliquis PTA. Last dose 3/1 at 10am. Pharmacy has been consulted to dose heparin.    Heparin level is therapeutic at 0.3, on 1850 units/hr. H&H, PLTs stable. No bleeding reported. Planning for CABG and AVR 3/7.  Goal of Therapy:  Heparin level 0.3 - 0.7  Monitor platelets by anticoagulation protocol: Yes   Plan:  Continue heparin 1850 units/hr Daily HL and CBC Monitor s/s bleeding  5/7, PharmD, BCCCP Clinical Pharmacist  Phone: [phone] 09/10/2020 3:30 PM  Please check AMION for all [facility] phone numbers After 10:00 PM, call Main Pharmacy [phone]

## 2020-09-10 NOTE — Progress Notes (Signed)
ANTICOAGULATION CONSULT NOTE - Follow Up Consult  Pharmacy Consult for Heparin Indication: : atrial fibrillation and CAD   Allergies  Allergen Reactions  . Codeine Nausea And Vomiting  . Demerol Nausea And Vomiting    Patient Measurements: Height: 5\' 10"  (177.8 cm) Weight: 117.4 kg (258 lb 13.1 oz) IBW/kg (Calculated) : 73 Heparin Dosing Weight: 100 kg  Vital Signs: Temp: 98.1 F (36.7 C) (03/06 0334) Temp Source: Oral (03/06 0334) BP: 153/64 (03/06 0334) Pulse Rate: 95 (03/06 0334)  Labs: Recent Labs    09/08/20 0357 09/08/20 1150 09/08/20 2045 09/09/20 0411 09/10/20 0258  HGB 8.3*  --   --  8.5* 8.4*  HCT 28.6*  --   --  29.1* 27.3*  PLT 139*  --   --  149* 142*  APTT 64* 59* 63* 80*  --   HEPARINUNFRC 0.60  --   --  0.40 0.29*  CREATININE 0.78  --   --  0.98 0.83    Estimated Creatinine Clearance: 100.3 mL/min (by C-G formula based on SCr of 0.83 mg/dL).  Assessment: Heparin for afib with h/o CVA and new CAD on Eliquis PTA. Last dose 3/1 at 10am. Pharmacy has been consulted to dose heparin.    Heparin level slightly subtherapeutic at 0.29 on 1750 units/hr. H&H, PLTs stable. No bleeding reported. Planning for CABG and AVR 3/7.  Goal of Therapy:  Heparin level 0.3 - 0.7  Monitor platelets by anticoagulation protocol: Yes   Plan:  Increase heparin 1850 units/hr 8h heparin level Daily HL and CBC Monitor s/s bleeding  5/7, PharmD PGY1 Acute Care Pharmacy Resident 09/10/2020 7:07 AM  Please check [email or web address] for unit specific pharmacy phone numbers.

## 2020-09-11 ENCOUNTER — Inpatient Hospital Stay (HOSPITAL_COMMUNITY): Payer: No Typology Code available for payment source | Admitting: Anesthesiology

## 2020-09-11 ENCOUNTER — Inpatient Hospital Stay (HOSPITAL_COMMUNITY): Payer: No Typology Code available for payment source

## 2020-09-11 ENCOUNTER — Encounter (HOSPITAL_COMMUNITY): Payer: Self-pay | Admitting: Cardiovascular Disease

## 2020-09-11 ENCOUNTER — Inpatient Hospital Stay (HOSPITAL_COMMUNITY)
Admission: EM | Disposition: A | Discharge status: Home / Self Care | Payer: Self-pay | Source: Home / Self Care | Attending: Surgery

## 2020-09-11 DIAGNOSIS — I2510 Atherosclerotic heart disease of native coronary artery without angina pectoris: Secondary | ICD-10-CM | POA: Diagnosis present

## 2020-09-11 HISTORY — PX: AORTIC VALVE REPLACEMENT: SHX41

## 2020-09-11 HISTORY — PX: TEE WITHOUT CARDIOVERSION: SHX5443

## 2020-09-11 HISTORY — PX: CORONARY ARTERY BYPASS GRAFT: SHX141

## 2020-09-11 HISTORY — PX: CLIPPING OF ATRIAL APPENDAGE: SHX5773

## 2020-09-11 LAB — POCT I-STAT 7, (LYTES, BLD GAS, ICA,H+H)
Acid-Base Excess: 1 mmol/L (ref 0.0–2.0)
Acid-Base Excess: 1 mmol/L (ref 0.0–2.0)
Acid-Base Excess: 3 mmol/L — ABNORMAL HIGH (ref 0.0–2.0)
Acid-Base Excess: 2 mmol/L (ref 0.0–2.0)
Acid-Base Excess: 0 mmol/L (ref 0.0–2.0)
Acid-base deficit: 1 mmol/L (ref 0.0–2.0)
Acid-base deficit: 1 mmol/L (ref 0.0–2.0)
Acid-base deficit: 2 mmol/L (ref 0.0–2.0)
Acid-base deficit: 1 mmol/L (ref 0.0–2.0)
Bicarbonate: 26.5 mmol/L (ref 20.0–28.0)
Bicarbonate: 27.7 mmol/L (ref 20.0–28.0)
Bicarbonate: 27.3 mmol/L (ref 20.0–28.0)
Bicarbonate: 27.5 mmol/L (ref 20.0–28.0)
Bicarbonate: 26.2 mmol/L (ref 20.0–28.0)
Bicarbonate: 23.8 mmol/L (ref 20.0–28.0)
Bicarbonate: 22.9 mmol/L (ref 20.0–28.0)
Bicarbonate: 22.6 mmol/L (ref 20.0–28.0)
Bicarbonate: 23.1 mmol/L (ref 20.0–28.0)
Calcium, Ion: 1.11 mmol/L — ABNORMAL LOW (ref 1.15–1.40)
Calcium, Ion: 1.15 mmol/L (ref 1.15–1.40)
Calcium, Ion: 1.16 mmol/L (ref 1.15–1.40)
Calcium, Ion: 1.21 mmol/L (ref 1.15–1.40)
Calcium, Ion: 1.24 mmol/L (ref 1.15–1.40)
Calcium, Ion: 1.24 mmol/L (ref 1.15–1.40)
Calcium, Ion: 1.23 mmol/L (ref 1.15–1.40)
Calcium, Ion: 1.21 mmol/L (ref 1.15–1.40)
Calcium, Ion: 1.22 mmol/L (ref 1.15–1.40)
HCT: 23 % — ABNORMAL LOW (ref 39.0–52.0)
HCT: 24 % — ABNORMAL LOW (ref 39.0–52.0)
HCT: 22 % — ABNORMAL LOW (ref 39.0–52.0)
HCT: 23 % — ABNORMAL LOW (ref 39.0–52.0)
HCT: 25 % — ABNORMAL LOW (ref 39.0–52.0)
HCT: 29 % — ABNORMAL LOW (ref 39.0–52.0)
HCT: 26 % — ABNORMAL LOW (ref 39.0–52.0)
HCT: 26 % — ABNORMAL LOW (ref 39.0–52.0)
HCT: 26 % — ABNORMAL LOW (ref 39.0–52.0)
Hemoglobin: 7.8 g/dL — ABNORMAL LOW (ref 13.0–17.0)
Hemoglobin: 8.2 g/dL — ABNORMAL LOW (ref 13.0–17.0)
Hemoglobin: 7.5 g/dL — ABNORMAL LOW (ref 13.0–17.0)
Hemoglobin: 7.8 g/dL — ABNORMAL LOW (ref 13.0–17.0)
Hemoglobin: 8.5 g/dL — ABNORMAL LOW (ref 13.0–17.0)
Hemoglobin: 9.9 g/dL — ABNORMAL LOW (ref 13.0–17.0)
Hemoglobin: 8.8 g/dL — ABNORMAL LOW (ref 13.0–17.0)
Hemoglobin: 8.8 g/dL — ABNORMAL LOW (ref 13.0–17.0)
Hemoglobin: 8.8 g/dL — ABNORMAL LOW (ref 13.0–17.0)
O2 Saturation: 100 %
O2 Saturation: 84 %
O2 Saturation: 100 %
O2 Saturation: 100 %
O2 Saturation: 100 %
O2 Saturation: 93 %
O2 Saturation: 94 %
O2 Saturation: 94 %
O2 Saturation: 96 %
Patient temperature: 36.7
Patient temperature: 36.7
Patient temperature: 36.7
Patient temperature: 36.7
Potassium: 4 mmol/L (ref 3.5–5.1)
Potassium: 4.1 mmol/L (ref 3.5–5.1)
Potassium: 4.8 mmol/L (ref 3.5–5.1)
Potassium: 4.6 mmol/L (ref 3.5–5.1)
Potassium: 4.2 mmol/L (ref 3.5–5.1)
Potassium: 4.3 mmol/L (ref 3.5–5.1)
Potassium: 4.5 mmol/L (ref 3.5–5.1)
Potassium: 4.5 mmol/L (ref 3.5–5.1)
Potassium: 4.6 mmol/L (ref 3.5–5.1)
Sodium: 142 mmol/L (ref 135–145)
Sodium: 142 mmol/L (ref 135–145)
Sodium: 140 mmol/L (ref 135–145)
Sodium: 141 mmol/L (ref 135–145)
Sodium: 142 mmol/L (ref 135–145)
Sodium: 143 mmol/L (ref 135–145)
Sodium: 143 mmol/L (ref 135–145)
Sodium: 141 mmol/L (ref 135–145)
Sodium: 142 mmol/L (ref 135–145)
TCO2: 28 mmol/L (ref 22–32)
TCO2: 29 mmol/L (ref 22–32)
TCO2: 28 mmol/L (ref 22–32)
TCO2: 29 mmol/L (ref 22–32)
TCO2: 28 mmol/L (ref 22–32)
TCO2: 25 mmol/L (ref 22–32)
TCO2: 24 mmol/L (ref 22–32)
TCO2: 24 mmol/L (ref 22–32)
TCO2: 24 mmol/L (ref 22–32)
pCO2 arterial: 45.4 mmHg (ref 32.0–48.0)
pCO2 arterial: 53.9 mmHg — ABNORMAL HIGH (ref 32.0–48.0)
pCO2 arterial: 37.2 mmHg (ref 32.0–48.0)
pCO2 arterial: 45.1 mmHg (ref 32.0–48.0)
pCO2 arterial: 49.3 mmHg — ABNORMAL HIGH (ref 32.0–48.0)
pCO2 arterial: 40.1 mmHg (ref 32.0–48.0)
pCO2 arterial: 35.1 mmHg (ref 32.0–48.0)
pCO2 arterial: 34.1 mmHg (ref 32.0–48.0)
pCO2 arterial: 34 mmHg (ref 32.0–48.0)
pH, Arterial: 7.319 — ABNORMAL LOW (ref 7.350–7.450)
pH, Arterial: 7.374 (ref 7.350–7.450)
pH, Arterial: 7.474 — ABNORMAL HIGH (ref 7.350–7.450)
pH, Arterial: 7.392 (ref 7.350–7.450)
pH, Arterial: 7.333 — ABNORMAL LOW (ref 7.350–7.450)
pH, Arterial: 7.381 (ref 7.350–7.450)
pH, Arterial: 7.422 (ref 7.350–7.450)
pH, Arterial: 7.428 (ref 7.350–7.450)
pH, Arterial: 7.438 (ref 7.350–7.450)
pO2, Arterial: 385 mmHg — ABNORMAL HIGH (ref 83.0–108.0)
pO2, Arterial: 54 mmHg — ABNORMAL LOW (ref 83.0–108.0)
pO2, Arterial: 322 mmHg — ABNORMAL HIGH (ref 83.0–108.0)
pO2, Arterial: 349 mmHg — ABNORMAL HIGH (ref 83.0–108.0)
pO2, Arterial: 203 mmHg — ABNORMAL HIGH (ref 83.0–108.0)
pO2, Arterial: 68 mmHg — ABNORMAL LOW (ref 83.0–108.0)
pO2, Arterial: 68 mmHg — ABNORMAL LOW (ref 83.0–108.0)
pO2, Arterial: 67 mmHg — ABNORMAL LOW (ref 83.0–108.0)
pO2, Arterial: 77 mmHg — ABNORMAL LOW (ref 83.0–108.0)

## 2020-09-11 LAB — POCT I-STAT, CHEM 8
BUN: 14 mg/dL (ref 8–23)
BUN: 18 mg/dL (ref 8–23)
BUN: 13 mg/dL (ref 8–23)
BUN: 14 mg/dL (ref 8–23)
BUN: 15 mg/dL (ref 8–23)
BUN: 14 mg/dL (ref 8–23)
BUN: 15 mg/dL (ref 8–23)
Calcium, Ion: 1.29 mmol/L (ref 1.15–1.40)
Calcium, Ion: 1.25 mmol/L (ref 1.15–1.40)
Calcium, Ion: 1.19 mmol/L (ref 1.15–1.40)
Calcium, Ion: 1.17 mmol/L (ref 1.15–1.40)
Calcium, Ion: 1.21 mmol/L (ref 1.15–1.40)
Calcium, Ion: 1.21 mmol/L (ref 1.15–1.40)
Calcium, Ion: 1.25 mmol/L (ref 1.15–1.40)
Chloride: 103 mmol/L (ref 98–111)
Chloride: 104 mmol/L (ref 98–111)
Chloride: 103 mmol/L (ref 98–111)
Chloride: 103 mmol/L (ref 98–111)
Chloride: 104 mmol/L (ref 98–111)
Chloride: 105 mmol/L (ref 98–111)
Chloride: 105 mmol/L (ref 98–111)
Creatinine, Ser: 0.7 mg/dL (ref 0.61–1.24)
Creatinine, Ser: 0.7 mg/dL (ref 0.61–1.24)
Creatinine, Ser: 0.6 mg/dL — ABNORMAL LOW (ref 0.61–1.24)
Creatinine, Ser: 0.6 mg/dL — ABNORMAL LOW (ref 0.61–1.24)
Creatinine, Ser: 0.7 mg/dL (ref 0.61–1.24)
Creatinine, Ser: 0.7 mg/dL (ref 0.61–1.24)
Creatinine, Ser: 0.7 mg/dL (ref 0.61–1.24)
Glucose, Bld: 155 mg/dL — ABNORMAL HIGH (ref 70–99)
Glucose, Bld: 137 mg/dL — ABNORMAL HIGH (ref 70–99)
Glucose, Bld: 112 mg/dL — ABNORMAL HIGH (ref 70–99)
Glucose, Bld: 134 mg/dL — ABNORMAL HIGH (ref 70–99)
Glucose, Bld: 155 mg/dL — ABNORMAL HIGH (ref 70–99)
Glucose, Bld: 145 mg/dL — ABNORMAL HIGH (ref 70–99)
Glucose, Bld: 158 mg/dL — ABNORMAL HIGH (ref 70–99)
HCT: 28 % — ABNORMAL LOW (ref 39.0–52.0)
HCT: 27 % — ABNORMAL LOW (ref 39.0–52.0)
HCT: 23 % — ABNORMAL LOW (ref 39.0–52.0)
HCT: 21 % — ABNORMAL LOW (ref 39.0–52.0)
HCT: 30 % — ABNORMAL LOW (ref 39.0–52.0)
HCT: 22 % — ABNORMAL LOW (ref 39.0–52.0)
HCT: 24 % — ABNORMAL LOW (ref 39.0–52.0)
Hemoglobin: 9.5 g/dL — ABNORMAL LOW (ref 13.0–17.0)
Hemoglobin: 9.2 g/dL — ABNORMAL LOW (ref 13.0–17.0)
Hemoglobin: 7.8 g/dL — ABNORMAL LOW (ref 13.0–17.0)
Hemoglobin: 10.2 g/dL — ABNORMAL LOW (ref 13.0–17.0)
Hemoglobin: 7.1 g/dL — ABNORMAL LOW (ref 13.0–17.0)
Hemoglobin: 7.5 g/dL — ABNORMAL LOW (ref 13.0–17.0)
Hemoglobin: 8.2 g/dL — ABNORMAL LOW (ref 13.0–17.0)
Potassium: 3.7 mmol/L (ref 3.5–5.1)
Potassium: 4.9 mmol/L (ref 3.5–5.1)
Potassium: 4.3 mmol/L (ref 3.5–5.1)
Potassium: 4.8 mmol/L (ref 3.5–5.1)
Potassium: 5.1 mmol/L (ref 3.5–5.1)
Potassium: 4.2 mmol/L (ref 3.5–5.1)
Potassium: 5.1 mmol/L (ref 3.5–5.1)
Sodium: 140 mmol/L (ref 135–145)
Sodium: 139 mmol/L (ref 135–145)
Sodium: 141 mmol/L (ref 135–145)
Sodium: 140 mmol/L (ref 135–145)
Sodium: 140 mmol/L (ref 135–145)
Sodium: 140 mmol/L (ref 135–145)
Sodium: 142 mmol/L (ref 135–145)
TCO2: 27 mmol/L (ref 22–32)
TCO2: 29 mmol/L (ref 22–32)
TCO2: 27 mmol/L (ref 22–32)
TCO2: 25 mmol/L (ref 22–32)
TCO2: 26 mmol/L (ref 22–32)
TCO2: 26 mmol/L (ref 22–32)
TCO2: 27 mmol/L (ref 22–32)

## 2020-09-11 LAB — BASIC METABOLIC PANEL
Anion gap: 11 (ref 5–15)
Anion gap: 8 (ref 5–15)
BUN: 14 mg/dL (ref 8–23)
BUN: 19 mg/dL (ref 8–23)
CO2: 23 mmol/L (ref 22–32)
CO2: 20 mmol/L — ABNORMAL LOW (ref 22–32)
Calcium: 8.8 mg/dL — ABNORMAL LOW (ref 8.9–10.3)
Calcium: 8.2 mg/dL — ABNORMAL LOW (ref 8.9–10.3)
Chloride: 104 mmol/L (ref 98–111)
Chloride: 109 mmol/L (ref 98–111)
Creatinine, Ser: 0.78 mg/dL (ref 0.61–1.24)
Creatinine, Ser: 0.8 mg/dL (ref 0.61–1.24)
GFR, Estimated: >60 mL/min (ref >60)
GFR, Estimated: >60 mL/min (ref >60)
Glucose, Bld: 129 mg/dL — ABNORMAL HIGH (ref 70–99)
Glucose, Bld: 147 mg/dL — ABNORMAL HIGH (ref 70–99)
Potassium: 3.9 mmol/L (ref 3.5–5.1)
Potassium: 4.4 mmol/L (ref 3.5–5.1)
Sodium: 138 mmol/L (ref 135–145)
Sodium: 137 mmol/L (ref 135–145)

## 2020-09-11 LAB — CBC
HCT: 29.7 % — ABNORMAL LOW (ref 39.0–52.0)
HCT: 26.8 % — ABNORMAL LOW (ref 39.0–52.0)
HCT: 26.6 % — ABNORMAL LOW (ref 39.0–52.0)
Hemoglobin: 8.7 g/dL — ABNORMAL LOW (ref 13.0–17.0)
Hemoglobin: 8 g/dL — ABNORMAL LOW (ref 13.0–17.0)
Hemoglobin: 7.8 g/dL — ABNORMAL LOW (ref 13.0–17.0)
MCH: 21.8 pg — ABNORMAL LOW (ref 26.0–34.0)
MCH: 22.1 pg — ABNORMAL LOW (ref 26.0–34.0)
MCH: 21.8 pg — ABNORMAL LOW (ref 26.0–34.0)
MCHC: 29.3 g/dL — ABNORMAL LOW (ref 30.0–36.0)
MCHC: 29.9 g/dL — ABNORMAL LOW (ref 30.0–36.0)
MCHC: 29.3 g/dL — ABNORMAL LOW (ref 30.0–36.0)
MCV: 74.3 fL — ABNORMAL LOW (ref 80.0–100.0)
MCV: 74 fL — ABNORMAL LOW (ref 80.0–100.0)
MCV: 74.5 fL — ABNORMAL LOW (ref 80.0–100.0)
Platelets: 146 10*3/uL — ABNORMAL LOW (ref 150–400)
Platelets: UNDETERMINED 10*3/uL (ref 150–400)
Platelets: UNDETERMINED 10*3/uL (ref 150–400)
RBC: 4 MIL/uL — ABNORMAL LOW (ref 4.22–5.81)
RBC: 3.62 MIL/uL — ABNORMAL LOW (ref 4.22–5.81)
RBC: 3.57 MIL/uL — ABNORMAL LOW (ref 4.22–5.81)
RDW: 17.5 % — ABNORMAL HIGH (ref 11.5–15.5)
RDW: 17.5 % — ABNORMAL HIGH (ref 11.5–15.5)
RDW: 17.4 % — ABNORMAL HIGH (ref 11.5–15.5)
WBC: 6.4 10*3/uL (ref 4.0–10.5)
WBC: 10.7 10*3/uL — ABNORMAL HIGH (ref 4.0–10.5)
WBC: 12.3 10*3/uL — ABNORMAL HIGH (ref 4.0–10.5)
nRBC: 0 % (ref 0.0–0.2)
nRBC: 0 % (ref 0.0–0.2)
nRBC: 0 % (ref 0.0–0.2)

## 2020-09-11 LAB — MAGNESIUM: Magnesium: 3.2 mg/dL — ABNORMAL HIGH (ref 1.7–2.4)

## 2020-09-11 LAB — HEMOGLOBIN AND HEMATOCRIT, BLOOD
HCT: 21.7 % — ABNORMAL LOW (ref 39.0–52.0)
Hemoglobin: 6.5 g/dL — CL (ref 13.0–17.0)

## 2020-09-11 LAB — GLUCOSE, CAPILLARY
Glucose-Capillary: 106 mg/dL — ABNORMAL HIGH (ref 70–99)
Glucose-Capillary: 107 mg/dL — ABNORMAL HIGH (ref 70–99)
Glucose-Capillary: 109 mg/dL — ABNORMAL HIGH (ref 70–99)
Glucose-Capillary: 113 mg/dL — ABNORMAL HIGH (ref 70–99)
Glucose-Capillary: 113 mg/dL — ABNORMAL HIGH (ref 70–99)
Glucose-Capillary: 117 mg/dL — ABNORMAL HIGH (ref 70–99)
Glucose-Capillary: 117 mg/dL — ABNORMAL HIGH (ref 70–99)
Glucose-Capillary: 120 mg/dL — ABNORMAL HIGH (ref 70–99)
Glucose-Capillary: 138 mg/dL — ABNORMAL HIGH (ref 70–99)
Glucose-Capillary: 144 mg/dL — ABNORMAL HIGH (ref 70–99)

## 2020-09-11 LAB — HEPARIN LEVEL (UNFRACTIONATED): Heparin Unfractionated: 0.27 IU/mL — ABNORMAL LOW (ref 0.30–0.70)

## 2020-09-11 LAB — PLATELET COUNT: Platelets: 121 10*3/uL — ABNORMAL LOW (ref 150–400)

## 2020-09-11 LAB — PROTIME-INR
INR: 1.6 — ABNORMAL HIGH (ref 0.8–1.2)
Prothrombin Time: 18.1 seconds — ABNORMAL HIGH (ref 11.4–15.2)

## 2020-09-11 LAB — ECHO INTRAOPERATIVE TEE
Height: 70 in
Weight: 4105.6 oz

## 2020-09-11 LAB — APTT: aPTT: 36 seconds (ref 24–36)

## 2020-09-11 LAB — PREPARE RBC (CROSSMATCH)

## 2020-09-11 SURGERY — CORONARY ARTERY BYPASS GRAFTING (CABG)
Anesthesia: General | Site: Chest

## 2020-09-11 MED ORDER — BISACODYL 10 MG RE SUPP: 10.0000 mg | Freq: Every day | RECTAL | Status: DC

## 2020-09-11 MED ORDER — MIDAZOLAM HCL 2 MG/2ML IJ SOLN: 2.0000 mg | INTRAMUSCULAR | Status: DC | PRN

## 2020-09-11 MED ORDER — MAGNESIUM SULFATE 4 GM/100ML IV SOLN
4.0000 g | Freq: Once | INTRAVENOUS | Status: AC
  Administered 2020-09-11: 4 g via INTRAVENOUS
  Filled 2020-09-11: qty 100

## 2020-09-11 MED ORDER — HYDRALAZINE HCL 20 MG/ML IJ SOLN: 10.0000 mg | INTRAMUSCULAR | Status: AC | PRN

## 2020-09-11 MED ORDER — MORPHINE SULFATE (PF) 2 MG/ML IV SOLN
1.0000 mg | INTRAVENOUS | Status: DC | PRN
  Administered 2020-09-11 – 2020-09-12 (×5): 2 mg via INTRAVENOUS
  Filled 2020-09-11 (×3): qty 1
  Filled 2020-09-11: qty 2
  Filled 2020-09-11: qty 1

## 2020-09-11 MED ORDER — LACTATED RINGERS IV SOLN: INTRAVENOUS | Status: DC

## 2020-09-11 MED ORDER — SODIUM CHLORIDE 0.9% FLUSH
3.0000 mL | Freq: Two times a day (BID) | INTRAVENOUS | Status: DC
  Administered 2020-09-11 – 2020-09-13 (×4): 3 mL via INTRAVENOUS

## 2020-09-11 MED ORDER — SODIUM CHLORIDE 0.9% IV SOLUTION: Freq: Once | INTRAVENOUS | Status: DC

## 2020-09-11 MED ORDER — PROPOFOL 1000 MG/100ML IV EMUL
INTRAVENOUS | Status: AC
  Filled 2020-09-11: qty 100

## 2020-09-11 MED ORDER — FAMOTIDINE IN NACL 20-0.9 MG/50ML-% IV SOLN
20.0000 mg | Freq: Two times a day (BID) | INTRAVENOUS | Status: DC
  Administered 2020-09-11: 20 mg via INTRAVENOUS

## 2020-09-11 MED ORDER — DEXMEDETOMIDINE HCL IN NACL 400 MCG/100ML IV SOLN
0.0000 ug/kg/h | INTRAVENOUS | Status: DC
  Administered 2020-09-11: 0.7 ug/kg/h via INTRAVENOUS
  Filled 2020-09-11: qty 100

## 2020-09-11 MED ORDER — ALBUMIN HUMAN 5 % IV SOLN: INTRAVENOUS | Status: DC | PRN

## 2020-09-11 MED ORDER — PLASMA-LYTE 148 IV SOLN
INTRAVENOUS | Status: DC | PRN
  Administered 2020-09-11: 500 mL

## 2020-09-11 MED ORDER — FENTANYL CITRATE (PF) 250 MCG/5ML IJ SOLN
INTRAMUSCULAR | Status: AC
  Filled 2020-09-11: qty 25

## 2020-09-11 MED ORDER — HEPARIN SODIUM (PORCINE) 1000 UNIT/ML IJ SOLN
INTRAMUSCULAR | Status: DC | PRN
  Administered 2020-09-11: 35000 [IU] via INTRAVENOUS

## 2020-09-11 MED ORDER — LEVALBUTEROL HCL 0.63 MG/3ML IN NEBU: 0.6300 mg | INHALATION_SOLUTION | Freq: Four times a day (QID) | RESPIRATORY_TRACT | Status: DC | PRN

## 2020-09-11 MED ORDER — CHLORHEXIDINE GLUCONATE 0.12 % MT SOLN
15.0000 mL | OROMUCOSAL | Status: AC
  Administered 2020-09-11: 15 mL via OROMUCOSAL

## 2020-09-11 MED ORDER — SODIUM CHLORIDE 0.9% FLUSH: 3.0000 mL | INTRAVENOUS | Status: DC | PRN

## 2020-09-11 MED ORDER — DEXAMETHASONE SODIUM PHOSPHATE 10 MG/ML IJ SOLN
INTRAMUSCULAR | Status: AC
  Filled 2020-09-11: qty 1

## 2020-09-11 MED ORDER — PROTAMINE SULFATE 10 MG/ML IV SOLN
INTRAVENOUS | Status: DC | PRN
  Administered 2020-09-11: 300 mg via INTRAVENOUS

## 2020-09-11 MED ORDER — ROCURONIUM BROMIDE 10 MG/ML (PF) SYRINGE
PREFILLED_SYRINGE | INTRAVENOUS | Status: DC | PRN
  Administered 2020-09-11: 50 mg via INTRAVENOUS
  Administered 2020-09-11: 100 mg via INTRAVENOUS
  Administered 2020-09-11: 20 mg via INTRAVENOUS
  Administered 2020-09-11: 50 mg via INTRAVENOUS
  Administered 2020-09-11: 10 mg via INTRAVENOUS

## 2020-09-11 MED ORDER — ASPIRIN 81 MG PO CHEW: 81.0000 mg | CHEWABLE_TABLET | Freq: Every day | ORAL | Status: DC

## 2020-09-11 MED ORDER — DEXAMETHASONE SODIUM PHOSPHATE 10 MG/ML IJ SOLN
INTRAMUSCULAR | Status: DC | PRN
  Administered 2020-09-11: 5 mg via INTRAVENOUS

## 2020-09-11 MED ORDER — ACETAMINOPHEN 325 MG PO TABS: 650.0000 mg | ORAL_TABLET | ORAL | Status: DC | PRN

## 2020-09-11 MED ORDER — HEMOSTATIC AGENTS (NO CHARGE) OPTIME
TOPICAL | Status: DC | PRN
  Administered 2020-09-11: 3 via TOPICAL

## 2020-09-11 MED ORDER — PROPOFOL 10 MG/ML IV BOLUS
INTRAVENOUS | Status: AC
  Filled 2020-09-11: qty 20

## 2020-09-11 MED ORDER — ACETAMINOPHEN 160 MG/5ML PO SOLN: 650.0000 mg | Freq: Once | ORAL | Status: AC

## 2020-09-11 MED ORDER — PHENYLEPHRINE 40 MCG/ML (10ML) SYRINGE FOR IV PUSH (FOR BLOOD PRESSURE SUPPORT)
PREFILLED_SYRINGE | INTRAVENOUS | Status: AC
  Filled 2020-09-11: qty 10

## 2020-09-11 MED ORDER — PROPOFOL 10 MG/ML IV BOLUS
INTRAVENOUS | Status: DC | PRN
  Administered 2020-09-11 (×2): 20 mg via INTRAVENOUS
  Administered 2020-09-11: 50 mg via INTRAVENOUS
  Administered 2020-09-11: 100 mg via INTRAVENOUS
  Administered 2020-09-11: 20 mg via INTRAVENOUS

## 2020-09-11 MED ORDER — NITROGLYCERIN IN D5W 200-5 MCG/ML-% IV SOLN
0.0000 ug/min | INTRAVENOUS | Status: DC
  Administered 2020-09-12: 5 ug/min via INTRAVENOUS
  Filled 2020-09-11: qty 250

## 2020-09-11 MED ORDER — ONDANSETRON HCL 4 MG/2ML IJ SOLN
INTRAMUSCULAR | Status: AC
  Filled 2020-09-11: qty 2

## 2020-09-11 MED ORDER — DEXTROSE 50 % IV SOLN: 0.0000 mL | INTRAVENOUS | Status: DC | PRN

## 2020-09-11 MED ORDER — ONDANSETRON HCL 4 MG/2ML IJ SOLN
INTRAMUSCULAR | Status: DC | PRN
  Administered 2020-09-11: 4 mg via INTRAVENOUS

## 2020-09-11 MED ORDER — POTASSIUM CHLORIDE 10 MEQ/50ML IV SOLN: 10.0000 meq | INTRAVENOUS | Status: AC

## 2020-09-11 MED ORDER — METOPROLOL TARTRATE 25 MG/10 ML ORAL SUSPENSION: 12.5000 mg | Freq: Two times a day (BID) | ORAL | Status: DC

## 2020-09-11 MED ORDER — SODIUM CHLORIDE 0.9 % IV SOLN
1.5000 g | Freq: Two times a day (BID) | INTRAVENOUS | Status: AC
  Administered 2020-09-11 – 2020-09-13 (×4): 1.5 g via INTRAVENOUS
  Filled 2020-09-11 (×4): qty 1.5

## 2020-09-11 MED ORDER — MIDAZOLAM HCL 5 MG/5ML IJ SOLN
INTRAMUSCULAR | Status: DC | PRN
  Administered 2020-09-11: 4 mg via INTRAVENOUS
  Administered 2020-09-11 (×2): 1 mg via INTRAVENOUS

## 2020-09-11 MED ORDER — FENTANYL CITRATE (PF) 250 MCG/5ML IJ SOLN
INTRAMUSCULAR | Status: DC | PRN
  Administered 2020-09-11: 100 ug via INTRAVENOUS
  Administered 2020-09-11 (×2): 50 ug via INTRAVENOUS
  Administered 2020-09-11 (×3): 100 ug via INTRAVENOUS
  Administered 2020-09-11: 200 ug via INTRAVENOUS
  Administered 2020-09-11 (×5): 100 ug via INTRAVENOUS

## 2020-09-11 MED ORDER — ORAL CARE MOUTH RINSE
15.0000 mL | OROMUCOSAL | Status: DC
  Administered 2020-09-11 (×3): 15 mL via OROMUCOSAL

## 2020-09-11 MED ORDER — BISACODYL 5 MG PO TBEC
10.0000 mg | DELAYED_RELEASE_TABLET | Freq: Every day | ORAL | Status: DC
  Administered 2020-09-12 – 2020-09-13 (×2): 10 mg via ORAL
  Filled 2020-09-11 (×2): qty 2

## 2020-09-11 MED ORDER — INSULIN REGULAR(HUMAN) IN NACL 100-0.9 UT/100ML-% IV SOLN: INTRAVENOUS | Status: DC

## 2020-09-11 MED ORDER — TRAMADOL HCL 50 MG PO TABS
50.0000 mg | ORAL_TABLET | ORAL | Status: DC | PRN
  Administered 2020-09-11 – 2020-09-13 (×5): 100 mg via ORAL
  Filled 2020-09-11 (×6): qty 2

## 2020-09-11 MED ORDER — ROCURONIUM BROMIDE 10 MG/ML (PF) SYRINGE
PREFILLED_SYRINGE | INTRAVENOUS | Status: AC
  Filled 2020-09-11: qty 30

## 2020-09-11 MED ORDER — PHENYLEPHRINE HCL-NACL 20-0.9 MG/250ML-% IV SOLN: 0.0000 ug/min | INTRAVENOUS | Status: DC

## 2020-09-11 MED ORDER — VANCOMYCIN HCL IN DEXTROSE 1-5 GM/200ML-% IV SOLN
1000.0000 mg | Freq: Once | INTRAVENOUS | Status: AC
  Administered 2020-09-11: 1000 mg via INTRAVENOUS
  Filled 2020-09-11: qty 200

## 2020-09-11 MED ORDER — MIDAZOLAM HCL (PF) 10 MG/2ML IJ SOLN
INTRAMUSCULAR | Status: AC
  Filled 2020-09-11: qty 2

## 2020-09-11 MED ORDER — SODIUM CHLORIDE 0.9 % IV SOLN: 250.0000 mL | INTRAVENOUS | Status: DC

## 2020-09-11 MED ORDER — SODIUM CHLORIDE 0.9 % IV SOLN: 250.0000 mL | INTRAVENOUS | Status: DC | PRN

## 2020-09-11 MED ORDER — SODIUM CHLORIDE 0.45 % IV SOLN: INTRAVENOUS | Status: DC | PRN

## 2020-09-11 MED ORDER — ASPIRIN 81 MG PO CHEW
324.0000 mg | CHEWABLE_TABLET | Freq: Every day | ORAL | Status: DC
  Filled 2020-09-11: qty 4

## 2020-09-11 MED ORDER — ACETAMINOPHEN 160 MG/5ML PO SOLN: 1000.0000 mg | Freq: Four times a day (QID) | ORAL | Status: AC

## 2020-09-11 MED ORDER — TRANEXAMIC ACID-NACL 1000-0.7 MG/100ML-% IV SOLN
1000.0000 mg | INTRAVENOUS | Status: DC
  Filled 2020-09-11: qty 100

## 2020-09-11 MED ORDER — SODIUM CHLORIDE 0.9 % IV SOLN: INTRAVENOUS | Status: AC

## 2020-09-11 MED ORDER — THROMBIN (RECOMBINANT) 20000 UNITS EX SOLR
CUTANEOUS | Status: AC
  Filled 2020-09-11: qty 20000

## 2020-09-11 MED ORDER — LEVALBUTEROL HCL 0.63 MG/3ML IN NEBU: 0.6300 mg | INHALATION_SOLUTION | Freq: Four times a day (QID) | RESPIRATORY_TRACT | Status: DC

## 2020-09-11 MED ORDER — HEPARIN SODIUM (PORCINE) 1000 UNIT/ML IJ SOLN
INTRAMUSCULAR | Status: AC
  Filled 2020-09-11: qty 1

## 2020-09-11 MED ORDER — SODIUM CHLORIDE 0.9 % IV SOLN
INTRAVENOUS | Status: DC
  Administered 2020-09-12: 20 mL/h via INTRAVENOUS

## 2020-09-11 MED ORDER — ARTIFICIAL TEARS OPHTHALMIC OINT
TOPICAL_OINTMENT | OPHTHALMIC | Status: DC | PRN
  Administered 2020-09-11: 1 via OPHTHALMIC

## 2020-09-11 MED ORDER — PANTOPRAZOLE SODIUM 40 MG PO TBEC
40.0000 mg | DELAYED_RELEASE_TABLET | Freq: Every day | ORAL | Status: DC
  Administered 2020-09-13 – 2020-09-17 (×5): 40 mg via ORAL
  Filled 2020-09-11 (×5): qty 1

## 2020-09-11 MED ORDER — ONDANSETRON HCL 4 MG/2ML IJ SOLN: 4.0000 mg | Freq: Four times a day (QID) | INTRAMUSCULAR | Status: DC | PRN

## 2020-09-11 MED ORDER — LACTATED RINGERS IV SOLN: INTRAVENOUS | Status: DC | PRN

## 2020-09-11 MED ORDER — ACETAMINOPHEN 500 MG PO TABS
1000.0000 mg | ORAL_TABLET | Freq: Four times a day (QID) | ORAL | Status: AC
  Administered 2020-09-11 – 2020-09-16 (×18): 1000 mg via ORAL
  Filled 2020-09-11 (×18): qty 2

## 2020-09-11 MED ORDER — SODIUM CHLORIDE 0.9% FLUSH
10.0000 mL | Freq: Two times a day (BID) | INTRAVENOUS | Status: DC
  Administered 2020-09-12 – 2020-09-15 (×7): 10 mL

## 2020-09-11 MED ORDER — LACTATED RINGERS IV SOLN: 500.0000 mL | Freq: Once | INTRAVENOUS | Status: DC | PRN

## 2020-09-11 MED ORDER — CHLORHEXIDINE GLUCONATE 0.12% ORAL RINSE (MEDLINE KIT)
15.0000 mL | Freq: Two times a day (BID) | OROMUCOSAL | Status: DC
  Administered 2020-09-11: 15 mL via OROMUCOSAL

## 2020-09-11 MED ORDER — METOPROLOL TARTRATE 12.5 MG HALF TABLET: 12.5000 mg | ORAL_TABLET | Freq: Two times a day (BID) | ORAL | Status: DC

## 2020-09-11 MED ORDER — THROMBIN 20000 UNITS EX SOLR
CUTANEOUS | Status: DC | PRN
  Administered 2020-09-11: 20000 [IU] via TOPICAL

## 2020-09-11 MED ORDER — CHLORHEXIDINE GLUCONATE CLOTH 2 % EX PADS
6.0000 | MEDICATED_PAD | Freq: Every day | CUTANEOUS | Status: DC
  Administered 2020-09-11 – 2020-09-17 (×7): 6 via TOPICAL

## 2020-09-11 MED ORDER — ONDANSETRON HCL 4 MG/2ML IJ SOLN
4.0000 mg | Freq: Four times a day (QID) | INTRAMUSCULAR | Status: DC | PRN
  Administered 2020-09-12 (×3): 4 mg via INTRAVENOUS
  Filled 2020-09-11 (×3): qty 2

## 2020-09-11 MED ORDER — LEVALBUTEROL HCL 0.63 MG/3ML IN NEBU
INHALATION_SOLUTION | RESPIRATORY_TRACT | Status: AC
  Filled 2020-09-11: qty 3

## 2020-09-11 MED ORDER — HEMOSTATIC AGENTS (NO CHARGE) OPTIME
TOPICAL | Status: DC | PRN
  Administered 2020-09-11: 1 via TOPICAL

## 2020-09-11 MED ORDER — ASPIRIN EC 325 MG PO TBEC
325.0000 mg | DELAYED_RELEASE_TABLET | Freq: Every day | ORAL | Status: DC
  Administered 2020-09-12 – 2020-09-16 (×5): 325 mg via ORAL
  Filled 2020-09-11 (×5): qty 1

## 2020-09-11 MED ORDER — ALBUMIN HUMAN 5 % IV SOLN
250.0000 mL | INTRAVENOUS | Status: DC | PRN
Start: 2020-09-11 — End: 2020-09-12
  Administered 2020-09-11 (×2): 12.5 g via INTRAVENOUS

## 2020-09-11 MED ORDER — SODIUM CHLORIDE 0.9% FLUSH
10.0000 mL | INTRAVENOUS | Status: DC | PRN
Start: 2020-09-11 — End: 2020-09-17

## 2020-09-11 MED ORDER — NITROGLYCERIN 0.2 MG/ML ON CALL CATH LAB
INTRAVENOUS | Status: DC | PRN
  Administered 2020-09-11 (×7): 40 ug via INTRAVENOUS

## 2020-09-11 MED ORDER — METOPROLOL TARTRATE 5 MG/5ML IV SOLN: 2.5000 mg | INTRAVENOUS | Status: DC | PRN

## 2020-09-11 MED ORDER — LABETALOL HCL 5 MG/ML IV SOLN: 10.0000 mg | INTRAVENOUS | Status: AC | PRN

## 2020-09-11 MED ORDER — ACETAMINOPHEN 650 MG RE SUPP
650.0000 mg | Freq: Once | RECTAL | Status: AC
  Administered 2020-09-11: 650 mg via RECTAL

## 2020-09-11 MED ORDER — SODIUM CHLORIDE (PF) 0.9 % IJ SOLN
INTRAMUSCULAR | Status: AC
  Filled 2020-09-11: qty 10

## 2020-09-11 MED ORDER — SODIUM CHLORIDE 0.9% FLUSH
3.0000 mL | Freq: Two times a day (BID) | INTRAVENOUS | Status: DC
  Administered 2020-09-13: 3 mL via INTRAVENOUS

## 2020-09-11 MED ORDER — DOCUSATE SODIUM 100 MG PO CAPS
200.0000 mg | ORAL_CAPSULE | Freq: Every day | ORAL | Status: DC
  Administered 2020-09-12 – 2020-09-16 (×5): 200 mg via ORAL
  Filled 2020-09-11 (×6): qty 2

## 2020-09-11 MED ORDER — SODIUM CHLORIDE 0.9 % IV SOLN: INTRAVENOUS | Status: DC | PRN

## 2020-09-11 SURGICAL SUPPLY — 116 items
ADAPTER CARDIO PERF ANTE/RETRO (ADAPTER) ×3 IMPLANT
ADH SKN CLS APL DERMABOND .7 (GAUZE/BANDAGES/DRESSINGS) ×2
ADPR PRFSN 84XANTGRD RTRGD (ADAPTER) ×2
ARTICLIP LAA PROCLIP II 45 (Clip) ×3 IMPLANT
BAG DECANTER FOR FLEXI CONT (MISCELLANEOUS) ×3 IMPLANT
BLADE CLIPPER SURG (BLADE) ×3 IMPLANT
BLADE STERNUM SYSTEM 6 (BLADE) ×3 IMPLANT
BLADE SURG 11 STRL SS (BLADE) ×1 IMPLANT
BLADE SURG 15 STRL LF DISP TIS (BLADE) ×2 IMPLANT
BLADE SURG 15 STRL SS (BLADE) ×3
BNDG ELASTIC 4X5.8 VLCR STR LF (GAUZE/BANDAGES/DRESSINGS) ×3 IMPLANT
BNDG ELASTIC 6X5.8 VLCR STR LF (GAUZE/BANDAGES/DRESSINGS) ×3 IMPLANT
BNDG GAUZE ELAST 4 BULKY (GAUZE/BANDAGES/DRESSINGS) ×3 IMPLANT
CANISTER SUCT 3000ML PPV (MISCELLANEOUS) ×3 IMPLANT
CANNULA ARTERIAL NVNT 3/8 22FR (MISCELLANEOUS) ×1 IMPLANT
CANNULA GUNDRY RCSP 15FR (MISCELLANEOUS) ×3 IMPLANT
CATH HEART VENT LEFT (CATHETERS) ×2 IMPLANT
CATH ROBINSON RED A/P 18FR (CATHETERS) ×9 IMPLANT
CATH THORACIC 28FR (CATHETERS) ×3 IMPLANT
CATH THORACIC 36FR (CATHETERS) ×3 IMPLANT
CATH THORACIC 36FR RT ANG (CATHETERS) ×3 IMPLANT
CLIP VESOCCLUDE MED 24/CT (CLIP) IMPLANT
CLIP VESOCCLUDE SM WIDE 24/CT (CLIP) ×2 IMPLANT
CNTNR URN SCR LID CUP LEK RST (MISCELLANEOUS) ×2 IMPLANT
CONT SPEC 4OZ STRL OR WHT (MISCELLANEOUS) ×3
COVER SURGICAL LIGHT HANDLE (MISCELLANEOUS) ×3 IMPLANT
DERMABOND ADVANCED (GAUZE/BANDAGES/DRESSINGS) ×1
DERMABOND ADVANCED .7 DNX12 (GAUZE/BANDAGES/DRESSINGS) IMPLANT
DEVICE ATRICLIP LAA PRCLPII 45 (Clip) IMPLANT
DRAPE CARDIOVASCULAR INCISE (DRAPES) ×3
DRAPE SLUSH/WARMER DISC (DRAPES) ×3 IMPLANT
DRAPE SRG 135X102X78XABS (DRAPES) ×2 IMPLANT
DRSG COVADERM 4X14 (GAUZE/BANDAGES/DRESSINGS) ×3 IMPLANT
ELECT BLADE 4.0 EZ CLEAN MEGAD (MISCELLANEOUS) ×3
ELECT CAUTERY BLADE 6.4 (BLADE) ×3 IMPLANT
ELECT REM PT RETURN 9FT ADLT (ELECTROSURGICAL) ×6
ELECTRODE BLDE 4.0 EZ CLN MEGD (MISCELLANEOUS) IMPLANT
ELECTRODE REM PT RTRN 9FT ADLT (ELECTROSURGICAL) ×4 IMPLANT
FELT TEFLON 1X6 (MISCELLANEOUS) ×6 IMPLANT
GAUZE SPONGE 4X4 12PLY STRL (GAUZE/BANDAGES/DRESSINGS) ×6 IMPLANT
GAUZE SPONGE 4X4 12PLY STRL LF (GAUZE/BANDAGES/DRESSINGS) ×2 IMPLANT
GLOVE SURG UNDER POLY LF SZ6.5 (GLOVE) IMPLANT
GLOVE SURG UNDER POLY LF SZ7 (GLOVE) IMPLANT
GLOVE TRIUMPH SURG SIZE 7.0 (KITS) ×6 IMPLANT
GOWN STRL REUS W/ TWL LRG LVL3 (GOWN DISPOSABLE) ×8 IMPLANT
GOWN STRL REUS W/ TWL XL LVL3 (GOWN DISPOSABLE) ×2 IMPLANT
GOWN STRL REUS W/TWL LRG LVL3 (GOWN DISPOSABLE) ×18
GOWN STRL REUS W/TWL XL LVL3 (GOWN DISPOSABLE) ×3
HEMOSTAT POWDER SURGIFOAM 1G (HEMOSTASIS) ×9 IMPLANT
HEMOSTAT SURGICEL 2X14 (HEMOSTASIS) ×3 IMPLANT
INSERT FOGARTY 61MM (MISCELLANEOUS) IMPLANT
INSERT FOGARTY XLG (MISCELLANEOUS) IMPLANT
KIT BASIN OR (CUSTOM PROCEDURE TRAY) ×3 IMPLANT
KIT CATH CPB BARTLE (MISCELLANEOUS) ×3 IMPLANT
KIT SUCTION CATH 14FR (SUCTIONS) ×3 IMPLANT
KIT TURNOVER KIT B (KITS) ×3 IMPLANT
KIT VASOVIEW HEMOPRO 2 VH 4000 (KITS) ×3 IMPLANT
LINE VENT (MISCELLANEOUS) ×1 IMPLANT
NS IRRIG 1000ML POUR BTL (IV SOLUTION) ×18 IMPLANT
PACK E OPEN HEART (SUTURE) ×3 IMPLANT
PACK OPEN HEART (CUSTOM PROCEDURE TRAY) ×3 IMPLANT
PAD ARMBOARD 7.5X6 YLW CONV (MISCELLANEOUS) ×6 IMPLANT
PAD ELECT DEFIB RADIOL ZOLL (MISCELLANEOUS) ×3 IMPLANT
PENCIL BUTTON HOLSTER BLD 10FT (ELECTRODE) ×3 IMPLANT
POSITIONER HEAD DONUT 9IN (MISCELLANEOUS) ×3 IMPLANT
PUNCH AORTIC ROTATE 4.0MM (MISCELLANEOUS) IMPLANT
PUNCH AORTIC ROTATE 4.5MM 8IN (MISCELLANEOUS) ×3 IMPLANT
PUNCH AORTIC ROTATE 5MM 8IN (MISCELLANEOUS) IMPLANT
SET CARDIOPLEGIA MPS 5001102 (MISCELLANEOUS) ×1 IMPLANT
SPONGE INTESTINAL PEANUT (DISPOSABLE) IMPLANT
SPONGE LAP 18X18 RF (DISPOSABLE) ×1 IMPLANT
SPONGE LAP 4X18 RFD (DISPOSABLE) ×5 IMPLANT
SUPPORT HEART JANKE-BARRON (MISCELLANEOUS) ×3 IMPLANT
SUT BONE WAX W31G (SUTURE) ×3 IMPLANT
SUT EB EXC GRN/WHT 2-0 V-5 (SUTURE) ×6 IMPLANT
SUT ETHIBON EXCEL 2-0 V-5 (SUTURE) ×2 IMPLANT
SUT ETHIBOND V-5 VALVE (SUTURE) ×1 IMPLANT
SUT MNCRL AB 4-0 PS2 18 (SUTURE) ×1 IMPLANT
SUT PROLENE 3 0 SH DA (SUTURE) IMPLANT
SUT PROLENE 3 0 SH1 36 (SUTURE) ×3 IMPLANT
SUT PROLENE 4 0 RB 1 (SUTURE) ×21
SUT PROLENE 4 0 SH DA (SUTURE) IMPLANT
SUT PROLENE 4-0 RB1 .5 CRCL 36 (SUTURE) ×6 IMPLANT
SUT PROLENE 5 0 C 1 36 (SUTURE) IMPLANT
SUT PROLENE 6 0 C 1 30 (SUTURE) IMPLANT
SUT PROLENE 7 0 BV 1 (SUTURE) IMPLANT
SUT PROLENE 7 0 BV1 MDA (SUTURE) ×3 IMPLANT
SUT PROLENE 8 0 BV175 6 (SUTURE) ×1 IMPLANT
SUT SILK  1 MH (SUTURE)
SUT SILK 1 MH (SUTURE) IMPLANT
SUT SILK 2 0 SH (SUTURE) IMPLANT
SUT STEEL 6MS V (SUTURE) IMPLANT
SUT STEEL STERNAL CCS#1 18IN (SUTURE) IMPLANT
SUT STEEL SZ 6 DBL 3X14 BALL (SUTURE) IMPLANT
SUT VIC AB 1 CTX 36 (SUTURE) ×6
SUT VIC AB 1 CTX36XBRD ANBCTR (SUTURE) ×4 IMPLANT
SUT VIC AB 2-0 CT1 27 (SUTURE) ×3
SUT VIC AB 2-0 CT1 TAPERPNT 27 (SUTURE) IMPLANT
SUT VIC AB 2-0 CTX 27 (SUTURE) IMPLANT
SUT VIC AB 3-0 SH 27 (SUTURE)
SUT VIC AB 3-0 SH 27X BRD (SUTURE) IMPLANT
SUT VIC AB 3-0 X1 27 (SUTURE) IMPLANT
SUT VICRYL 4-0 PS2 18IN ABS (SUTURE) IMPLANT
SYSTEM SAHARA CHEST DRAIN ATS (WOUND CARE) ×3 IMPLANT
TAPE CLOTH SURG 4X10 WHT LF (GAUZE/BANDAGES/DRESSINGS) ×1 IMPLANT
TAPE PAPER 2X10 WHT MICROPORE (GAUZE/BANDAGES/DRESSINGS) ×1 IMPLANT
TOWEL GREEN STERILE (TOWEL DISPOSABLE) ×3 IMPLANT
TOWEL GREEN STERILE FF (TOWEL DISPOSABLE) ×3 IMPLANT
TRAY FOLEY SLVR 16FR TEMP STAT (SET/KITS/TRAYS/PACK) ×3 IMPLANT
TUBE GAUZE SZ 6 (GAUZE/BANDAGES/DRESSINGS) ×1 IMPLANT
TUBING LAP HI FLOW INSUFFLATIO (TUBING) ×3 IMPLANT
UNDERPAD 30X36 HEAVY ABSORB (UNDERPADS AND DIAPERS) ×3 IMPLANT
VALVE AORTIC SZ25 INSP/RESIL (Prosthesis & Implant Heart) ×1 IMPLANT
VENT LEFT HEART 12002 (CATHETERS) ×3
WATER STERILE IRR 1000ML POUR (IV SOLUTION) ×6 IMPLANT
YANKAUER SUCT BULB TIP NO VENT (SUCTIONS) ×1 IMPLANT

## 2020-09-11 NOTE — Procedures (Signed)
Extubation Procedure Note  Patient Details:   Name: LEAM MADERO DOB: 1946-01-10 MRN: [id number]   Airway Documentation:  Airway 8 mm (Active)  Secured at (cm) 23 cm 09/11/20 1942  Measured From Lips 09/11/20 1942  Secured Location Right 09/11/20 1942  Secured By Pink Tape 09/11/20 1942  Prone position No 09/11/20 1942  Cuff Pressure (cm H2O) 30 cm H2O 09/11/20 1620  Site Condition Dry 09/11/20 1942   Vent end date: 09/11/20 Vent end time: 2030       Evaluation   O2 sats: stable throughout Complications: No apparent complications Patient did tolerate proce dure well. Bilateral Breath Sounds: Clear,Diminished   Pt mouth was suctioned and airway. Leak present. Extubated at 2030 No stridor noted. Pt on 4L [place]. NIF -20 VC 1L . No complications noted   Ulice Brilliant 09/11/2020, 9:23 PM

## 2020-09-11 NOTE — Discharge Summary (Signed)
Physician Discharge Summary       [address]       [place] [address]             [phone]    Patient ID: Mason Patton MRN: [id number] DOB/AGE: 75-Nov-1947 75 y.o.  Admit date: 09/05/2020 Discharge date: 09/17/2020  Admission Diagnoses: 1. Unstable angina ([place]) 2. Coronary artery disease 3. Aortic stenosis, severe 4. History of atrial fibrillation [facility])  Discharge Diagnoses:  1. S/p CABG x 2, AVR, LA clip 2. Expected post op blood loss anemia 3. History of the following:  Arthritis   . Carotid artery stenosis    11/03/19 [place] ([facility]): 50-69% RICA stenosis  . Cholecystitis with cholelithiasis   . Eczema   . Emphysema of lung ([place])   . Gallstones and inflammation of gallbladder without obstruction   . Heart murmur   . History of nuclear stress test 02/23/2010   dipyridamole; normal pattern of perfusion in all regions; normal, low risk study   . Hyperlipidemia   . Hypertension   . 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 Meningoencephalitis   . Obesity   . OSA on CPAP    last sleep study >8 yrs  . Pneumonia    "twice" (05/19/2013)  . Shortness of breath   . Stroke [facility])    "they said I had a minor one when I had menigitis" (05/19/2013)  . Thyroid disease   . Tobacco abuse    quit 11/08/2012  . Type II diabetes mellitus ([place])   . Umbilical hernia    Consults: None  Procedure (s):  1. Median Sternotomy 2. Extracorporeal circulation 3.   Coronary artery bypass grafting x 2   Left internal mammary artery graft to the LAD  SVG to PD   4.   Endoscopic vein harvest from the right leg 5.   Aortic valve replacement using a 25 mm Edwards INSPIRIS RESILIA pericardial valve (model 11500A, SN [id number]) by Dr. Caffie Pinto on 09/11/2020.  History of Presenting Illness: The patient is a 75 year old gentleman with history of type 2 diabetes, hypertension, hyperlipidemia, hypothyroidism,  OSA on CPAP, chronic atrial fibrillation on anticoagulation, 50-pack-year smoking history with ongoing 1/2 pack/day smoking, and known 50 to 69% right internal carotid artery stenosis by ultrasound in 10/2019 who presents with a 2 to 29-monthhistory of progressive exertional fatigue, substernal chest pain and shortness of breath as well as lower extremity edema.  It has gotten to the point where he cannot do any activity and spends most of his day sitting in the house watching TV and reading.  He has been having chest discomfort every day.  [facility] with worsening chest discomfort and shortness of breath.  2D echocardiogram on 09/06/2020 showed left ventricular ejection fraction of 45 to 50% with mild left ventricular global hypokinesis.  The aortic valve is trileaflet with severe aortic stenosis.  The aortic valve area by VTI 0.67 cm.  The mean gradient was 35 mmHg.  Right ventricular systolic function was normal.  There was trivial mitral regurgitation.  He underwent cardiac catheterization yesterday showing severe two-vessel coronary disease with diffusely calcified coronaries.  The proximal LAD had an 80% hazy stenosis.  The RCA had 80% proximal and 80% mid stenoses.  PA pressure was 56/25 with a mean of 35.  Mean pulmonary wedge pressure was 31 with an LVEDP of  25.  The mean gradient across the aortic valve was 25 mmHg.  Cardiac index was 2.7.  The patient lives with his wife.  He sees his dentist every 6 months and has had no dental problems.  His coronary arteries are heavily calcified and not amenable to PCI.  Dr. Cyndia Bent agreed that the best treatment for him is coronary bypass graft surgery and aortic valve replacement with a bioprosthetic valve as well as clipping of his left atrial appendage. Dr. Cyndia Bent  discussed the operative procedure with the patient and his wife including alternatives, benefits and risks.  Patient agreed to proceed with surgery. Pre operative carotid duplex US showed no significant internal carotid artery stenosis bilaterally. Patient underwent a CABG x 2, LA clip, and bioprosthetic AVR on 09/11/2020.  Brief Hospital Course:  The patient was extubated the evening of surgery without difficulty. He was initially AV paced. He remained afebrile and hemodynamically stable. Mason Patton, a line, chest tubes, and foley were removed early in the post operative course. On post op day one he was in sinus rhythm with first degree AV block. He was restarted on low dose Coreg and Losartan. He had ABL anemia. His H and H decreased to 7.2 and 24.4.  He did require a post op transfusion on 03/10. He was also started on oral Iron. Last H and H was 8.5/27.7. He has a history of chronic, persistent a fib and went back into a fib with CVR post op day 2. He will be restarted on Apixaban at discharge and ec asa should be decreased to 81 mg daily. He had AKI and his creatinine went up to 1.51. Losartan was stopped. He was put on a Dopamine drip. His creatinine did improve. His creatinine on 03/11 was down to 1.05. He was volume overloaded and diuresed initially with Metolazone and Lasix. He then had hypotension so Coreg was stopped. He was weaned off the insulin drip.  The patient's glucose remained well controlled. He will be restarted on Metformin and glipizide closer to discharge. The patient's HGA1C pre op was 7.4. BP improved so he was restarted on Coreg 3.125 mg bid on 03/11. Per cardiology, because of previous bradycardia, Coreg should not be titrated. The patient was felt surgically stable for transfer from the ICU to [facility]U for further convalescence on 03/11. He continues to progress with cardiac rehab. He was ambulating on room air with good oxygenation. He has been tolerating a diet and has had a bowel movement. Epicardial pacing wires were removed on 03/11. Chest tube sutures will be removed in the office after  discharge. The patient is felt surgically stable for discharge today.   Latest Vital Signs: Blood pressure (!) 149/88, pulse 75, temperature 98.6 F (37 C), temperature source Oral, resp. rate 18, height _0  (1.778 m), weight 115.2 kg, SpO2 98 %.  Physical Exam:  General appearance: alert, cooperative and no distress Heart: irregularly irregular rhythm Lungs: clear to auscultation bilaterally Abdomen: soft, non-tender; bowel sounds normal; no masses,  no organomegaly Extremities: extremities normal, atraumatic, no cyanosis or edema Wound: clean and dry  Discharge Condition:Stable and discharged to home.  Recent laboratory studies:  Lab Results  Component Value Date   WBC 7.9 09/16/2020   HGB 8.5 (L) 09/16/2020   HCT 27.7 (L) 09/16/2020   MCV 75.1 (L) 09/16/2020   PLT 149 (L) 09/16/2020   Lab Results  Component Value Date   NA 137 09/16/2020   K 3.6 09/16/2020   CL 106  09/16/2020   CO2 23 09/16/2020   CREATININE 0.92 09/16/2020   GLUCOSE 104 (H) 09/16/2020      Diagnostic Studies: DG Chest 2 View  Result Date: 09/16/2020 CLINICAL DATA:  Pleural effusion. EXAM: CHEST - 2 VIEW COMPARISON:  Chest x-ray 09/13/2020 FINDINGS: Removal of a right internal jugular central venous Cordis. The heart size and mediastinal contours are unchanged. Aortic arch calcifications. Atrial appendage clip. Aortic arch replacement. Suggestion of pericardial calcifications on the lateral view. Biapical pleural/pulmonary scarring. No focal consolidation. No pulmonary edema. Persistent trace small volume left pleural effusion. No pneumothorax. No acute osseous abnormality. IMPRESSION: Persistent trace-small volume left pleural effusion. Electronically Signed   By: Iven Finn M.D.   On: 09/16/2020 05:34   DG Chest 2 View  Result Date: 09/10/2020 CLINICAL DATA:  Preop EXAM: CHEST - 2 VIEW COMPARISON:  September 06, 2020 FINDINGS: The cardiomediastinal silhouette is unchanged in contour.Atherosclerotic  calcifications of the aorta. Tortuous thoracic aorta. Unchanged lucency the RIGHT lung base likely due to skin fold. No pleural effusion. No pneumothorax. No acute pleuroparenchymal abnormality. Visualized abdomen is unremarkable. Multilevel degenerative changes of the thoracic spine. IMPRESSION: No acute cardiopulmonary abnormality. Electronically Signed   By: Valentino Saxon MD   On: 09/10/2020 17:58   CARDIAC CATHETERIZATION  Result Date: 09/07/2020  Prox RCA lesion is 80% stenosed.  Mid RCA lesion is 80% stenosed.  Prox LAD lesion is 80% stenosed.  Hemodynamic findings consistent with aortic valve stenosis.  Mason Patton is a 75 y.o. male  [id number] LOCATION:  FACILITY: [place] PHYSICIAN: Quay Burow, M.D. 1945/12/05 DATE OF PROCEDURE:  09/07/2020 DATE OF DISCHARGE: CARDIAC CATHETERIZATION History obtained from chart review.75 y.o. male with past medical history of permanent atrial fibrillation, aortic stenosis, hypertension, diabetes mellitus, prior CVA, hyperlipidemia, obstructive sleep apnea, anemia admitted with chest pain and dyspnea and noted to have abnormal nuclear study as an outpatient.  Echocardiogram shows ejection fraction 45 to 50%, biatrial enlargement, severe aortic stenosis with mean gradient 35 mmHg.   Mr. Moch has severely calcified coronary arteries with severe stenosis in the LAD and RCA as well as severe aortic stenosis.  He will need CABG x2 and aortic valve replacement.  The radial sheath was removed and a TR band was placed on the right wrist to achieve patent hemostasis.  The right antecubital sheath venous sheath was removed and a bandage applied.  The patient left lab in stable condition.  TCS will be notified.  Heparin will be restarted without a bolus 4 hours after sheath removal. Quay Burow. MD, [facility] 09/07/2020 3:26 PM   DG Chest Port 1 View  Result Date: 09/13/2020 CLINICAL DATA:  Open-heart surgery. EXAM: PORTABLE CHEST 1 VIEW COMPARISON:  09/2020. FINDINGS:  Interim removal of Swan-Ganz catheter, mediastinal drainage catheter, and left chest tube. Right IJ sheath in stable position. Prior cardiac valve replacement. Left atrial appendage clip in stable position. Stable cardiomegaly. Mild left base atelectasis and small left pleural effusion again noted. No pneumothorax. IMPRESSION: 1. Interim removal of Swan-Ganz catheter, mediastinal drainage catheter, and left chest tube. Right IJ sheath in stable position. No pneumothorax. 2.  Prior cardiac valve replacement.  Stable cardiomegaly. 3. Low lung volumes with persistent left base atelectasis. Small left pleural effusion cannot be excluded. No interim change. Electronically Signed   By: Marcello Moores  Register   On: 09/13/2020 06:28   DG Chest Port 1 View  Result Date: 09/12/2020 CLINICAL DATA:  Chest tube.  Sore chest.  Shortness of breath EXAM: PORTABLE  CHEST 1 VIEW COMPARISON:  09/11/2020. FINDINGS: Interim extubation and removal of NG tube. Mediastinal drainage catheter, Swan-Ganz catheter, left chest tube in stable position. Prior cardiac valve replacement. Cardiomegaly. Low lung volumes with persistent left base atelectasis. Mild left base infiltrate cannot be excluded. Small left pleural effusion. No pneumothorax. IMPRESSION: 1. Interim extubation and removal of NG tube. Mediastinal drainage catheter, Swan-Ganz catheter, left chest tube in stable position. No pneumothorax. 2. Prior cardiac valve replacement. Cardiomegaly. 3. Low lung volumes with persistent left base atelectasis. Mild left base infiltrate cannot be excluded. Small left pleural effusion. Electronically Signed   By: Marcello Moores  Register   On: 09/12/2020 05:39   DG Chest Port 1 View  Result Date: 09/11/2020 CLINICAL DATA:  Postop CABG. EXAM: PORTABLE CHEST 1 VIEW COMPARISON:  09/10/2020. FINDINGS: Postop open heart surgery with aortic valve replacement and left atrial appendage clipping. Endotracheal tube in good position. Swan-Ganz catheter in the main  pulmonary artery. Left chest tube in place. Mediastinal drain in place. No pneumothorax. Left lower lobe atelectasis. No significant effusion. IMPRESSION: Postop open heart surgery. Negative for pneumothorax or heart failure. Left lower lobe atelectasis. Electronically Signed   By: Franchot Gallo M.D.   On: 09/11/2020 14:57   DG Chest Port 1 View  Result Date: 09/06/2020 CLINICAL DATA:  Chest pain EXAM: PORTABLE CHEST 1 VIEW COMPARISON:  11/07/2014 FINDINGS: The heart size and mediastinal contours are within normal limits. Both lungs are clear. The visualized skeletal structures are unremarkable. IMPRESSION: No active disease. Electronically Signed   By: Fidela Salisbury MD   On: 09/06/2020 04:14   ECHOCARDIOGRAM COMPLETE  Result Date: 09/06/2020    ECHOCARDIOGRAM REPORT   Patient Name:   KAEDIN HICKLIN Date of Exam: 09/06/2020 Medical Rec #:  [id number]        Height:       70.0 in Accession #:    [id number]       Weight:       255.0 lb Date of Birth:  03/17/1946        BSA:          2.314 m Patient Age:    59 years         BP:           136/75 mmHg Patient Gender: M                HR:           46 bpm. Exam Location:  Inpatient Procedure: 2D Echo, Cardiac Doppler and Color Doppler Indications:    I42.9 Cardiomyopathy (unspecified)  History:        Patient has prior history of Echocardiogram examinations, most                 recent 11/16/2013. Stroke and Carotid Disease, Aortic Valve                 Disease, Arrythmias:Atrial Fibrillation, Signs/Symptoms:Murmur                 and Dyspnea; Risk Factors:Hypertension, Diabetes, Dyslipidemia,                 Former Smoker and Sleep Apnea. Thyroid Disease.  Sonographer:    Jonelle Sidle Dance Referring Phys: [id number] [place]  1. Left ventricular ejection fraction, by estimation, is 45 to 50%. The left ventricle has mildly decreased function. The left ventricle demonstrates global hypokinesis. Left ventricular diastolic parameters are indeterminate.  2.  Right ventricular systolic  function is normal. The right ventricular size is normal. Tricuspid regurgitation signal is inadequate for assessing PA pressure.  3. Left atrial size was moderately dilated.  4. Right atrial size was mildly dilated.  5. The mitral valve is normal in structure. Trivial mitral valve regurgitation. No evidence of mitral stenosis. Moderate mitral annular calcification.  6. The aortic valve is tricuspid. Aortic valve regurgitation is not visualized. Severe aortic valve stenosis. Aortic valve area, by VTI measures 0.67 cm. Aortic valve mean gradient measures 35.0 mmHg.  7. Aortic dilatation noted. 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  8. The inferior vena cava is normal in size with <50% respiratory variability, suggesting right atrial pressure of 8 mmHg. FINDINGS  Left Ventricle: Left ventricular ejection fraction, by estimation, is 45 to 50%. The left ventricle has mildly decreased function. The left ventricle demonstrates global hypokinesis. The left ventricular internal cavity size was normal in size. There is  no left ventricular hypertrophy. Left ventricular diastolic parameters are indeterminate. Right Ventricle: The right ventricular size is normal. No increase in right ventricular wall thickness. Right ventricular systolic function is normal. Tricuspid regurgitation signal is inadequate for assessing PA pressure. Left Atrium: Left atrial size was moderately dilated. Right Atrium: Right atrial size was mildly dilated. Pericardium: Trivial pericardial effusion is present. Mitral Valve: The mitral valve is normal in structure. Moderate mitral annular calcification. Trivial mitral valve regurgitation. No evidence of mitral valve stenosis. Tricuspid Valve: The tricuspid valve is normal in structure. Tricuspid valve regurgitation is not demonstrated. Aortic Valve: The aortic valve is tricuspid. Aortic valve regurgitation is not visualized. Severe aortic stenosis is  present. Aortic valve mean gradient measures 35.0 mmHg. Aortic valve peak gradient measures 53.4 mmHg. Aortic valve area, by VTI measures  0.67 cm. Pulmonic Valve: The pulmonic valve was normal in structure. Pulmonic valve regurgitation is not visualized. Aorta: Aortic dilatation noted. 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 Venous: The inferior vena cava is normal in size with less than 50% respiratory variability, suggesting right atrial pressure of 8 mmHg. IAS/Shunts: No atrial level shunt detected by color flow Doppler.  LEFT VENTRICLE PLAX 2D LVIDd:         5.60 cm LVIDs:         4.20 cm LV PW:         1.40 cm LV IVS:        1.00 cm LVOT diam:     2.30 cm LV SV:         69 LV SV Index:   30 LVOT Area:     4.15 cm  RIGHT VENTRICLE          IVC RV Basal diam:  3.10 cm  IVC diam: 2.00 cm RV Mid diam:    1.40 cm TAPSE (M-mode): 1.9 cm LEFT ATRIUM              Index       RIGHT ATRIUM           Index LA diam:        5.80 cm  2.51 cm/m  RA Area:     25.30 cm LA Vol (A2C):   161.0 ml 69.57 ml/m RA Volume:   82.90 ml  35.82 ml/m LA Vol (A4C):   81.1 ml  35.04 ml/m LA Biplane Vol: 116.0 ml 50.12 ml/m  AORTIC VALVE AV Area (Vmax):    0.72 cm AV Area (Vmean):   0.68 cm AV  Area (VTI):     0.67 cm AV Vmax:           365.50 cm/s AV Vmean:          255.500 cm/s AV VTI:            1.027 m AV Peak Grad:      53.4 mmHg AV Mean Grad:      35.0 mmHg LVOT Vmax:         63.20 cm/s LVOT Vmean:        41.550 cm/s LVOT VTI:          0.165 m LVOT/AV VTI ratio: 0.16  AORTA Ao Root diam: 3.70 cm Ao Asc diam:  3.70 cm MITRAL VALVE MV Area (PHT): 2.24 cm     SHUNTS MV Decel Time: 338 msec     Systemic VTI:  0.16 m MV E velocity: 107.00 cm/s  Systemic Diam: 2.30 cm Loralie Champagne MD Electronically signed by Loralie Champagne MD Signature Date/Time: 09/06/2020/3:19:52 PM    Final    ECHO INTRAOPERATIVE TEE  Result Date: 09/11/2020  *INTRAOPERATIVE TRANSESOPHAGEAL REPORT *  [name] Date  of Exam: 09/11/2020 Medical Rec #:  [id number]        Height:       70.0 in Accession #:    [id number]       Weight:       256.6 lb Date of Birth:  September 14, 1945        BSA:          2.32 m Patient Age:    33 years         BP:           183/77 mmHg Patient Gender: M                HR:           68 bpm. Exam Location:  Anesthesiology Transesophogeal exam was perform intraoperatively during surgical procedure. Patient was closely monitored under general anesthesia during the entirety of examination. Indications:     Atrial fibrillation and Flutter; I35.0 Nonrheumatic aortic                  (valve) stenosis; I20.0 Unstable angina Performing Phys: Adele Barthel, MD Diagnosing Phys: Adele Barthel MD Complications: No known complications during this procedure. POST-OP IMPRESSIONS - Left Ventricle: The left ventricle is unchanged from pre-bypass. - Right Ventricle: The right ventricle appears unchanged from pre-bypass. - Aorta: The aorta appears unchanged from pre-bypass. - Aortic Valve: There is no regurgitation. No regurgitation post repair. The gradient recorded across the prosthetic valve is within the expected range. Peak of 10 with a mean 20f5. No perivalvular leak noted. - Mitral Valve: The mitral valve appears unchanged from pre-bypass. - Tricuspid Valve: The tricuspid valve appears unchanged from pre-bypass. - Pulmonic Valve: The pulmonic valve appears unchanged from pre-bypass. - Interatrial Septum: The interatrial septum appears unchanged from pre-bypass. - Pericardium: The pericardium appears unchanged from pre-bypass. PRE-OP FINDINGS  Left Ventricle: The left ventricle has mild-moderately reduced systolic function, with an ejection fraction of 40-45%. The cavity size was normal. There is mildly increased left ventricular wall thickness. There is mild concentric left ventricular hypertrophy. Right Ventricle: The right ventricle has normal systolic function. The cavity was normal. There is no increase in right  ventricular wall thickness. Left Atrium: Left atrial size was normal in size. No left atrial/left atrial appendage thrombus was detected. Right Atrium: Right atrial  size was normal in size. Interatrial Septum: No atrial level shunt detected by color flow Doppler. Pericardium: There is no evidence of pericardial effusion. Mitral Valve: The mitral valve is normal in structure. Mitral valve regurgitation is mild by color flow Doppler. Tricuspid Valve: The tricuspid valve was normal in structure. Tricuspid valve regurgitation is trivial by color flow Doppler. Aortic Valve: Thick and calcified. Aortic valve regurgitation was not visualized by color flow Doppler. There is moderate stenosis of the aortic valve. Pulmonic Valve: The pulmonic valve was normal in structure. Pulmonic valve regurgitation is not visualized by color flow Doppler. Aorta: The aortic root, ascending aorta and aortic arch are normal in size and structure.  Adele Barthel MD Electronically signed by Adele Barthel MD Signature Date/Time: 09/11/2020/4:56:07 PM    Final    VAS US DOPPLER PRE CABG  Result Date: 09/08/2020 PREOPERATIVE VASCULAR EVALUATION  Indications:      Pre-CABG. Risk Factors:     Hypertension. Comparison Study: No prior studies. Performing Technologist: Carlos Levering RVT  Examination Guidelines: A complete evaluation includes B-mode imaging, spectral Doppler, color Doppler, and power Doppler as needed of all accessible portions of each vessel. Bilateral testing is considered an integral part of a complete examination. Limited examinations for reoccurring indications may be performed as noted.  Right Carotid Findings: +----------+--------+-------+--------+--------------------------------+--------+           PSV cm/sEDV    StenosisDescribe                        Comments                   cm/s                                                    +----------+--------+-------+--------+--------------------------------+--------+  CCA Prox  54      9              smooth and heterogenous                  +----------+--------+-------+--------+--------------------------------+--------+ CCA Distal49      11             smooth, heterogenous and                                                  calcific                                 +----------+--------+-------+--------+--------------------------------+--------+ ICA Prox  162     44     40-59%  calcific                                 +----------+--------+-------+--------+--------------------------------+--------+ ICA Mid   180     64     40-59%  smooth and heterogenous                  +----------+--------+-------+--------+--------------------------------+--------+ ICA Distal63      12  tortuous +----------+--------+-------+--------+--------------------------------+--------+ ECA       81      10                                                      +----------+--------+-------+--------+--------------------------------+--------+ Portions of this table do not appear on this page. +----------+--------+-------+--------+------------+           PSV cm/sEDV cmsDescribeArm Pressure +----------+--------+-------+--------+------------+ [id number]                                  +----------+--------+-------+--------+------------+ +---------+--------+--+--------+-+---------+ VertebralPSV cm/s38EDV cm/s6Antegrade +---------+--------+--+--------+-+---------+ Left Carotid Findings: +----------+--------+--------+--------+-----------------------+--------+           PSV cm/sEDV cm/sStenosisDescribe               Comments +----------+--------+--------+--------+-----------------------+--------+ CCA Prox  65      10              smooth and heterogenous         +----------+--------+--------+--------+-----------------------+--------+ CCA Distal41      10              smooth and  heterogenous         +----------+--------+--------+--------+-----------------------+--------+ ICA Prox  70      12              calcific                        +----------+--------+--------+--------+-----------------------+--------+ ICA Mid   86      23              smooth and heterogenous         +----------+--------+--------+--------+-----------------------+--------+ ICA Distal80      18                                     tortuous +----------+--------+--------+--------+-----------------------+--------+ ECA       66      6                                               +----------+--------+--------+--------+-----------------------+--------+ +----------+--------+--------+--------+------------+ SubclavianPSV cm/sEDV cm/sDescribeArm Pressure +----------+--------+--------+--------+------------+           76                                   +----------+--------+--------+--------+------------+ +---------+--------+--+--------+--+---------+ VertebralPSV cm/s62EDV cm/s19Antegrade +---------+--------+--+--------+--+---------+  ABI Findings: +--------+------------------+-----+---------+--------+ Right   Rt Pressure (mmHg)IndexWaveform Comment  +--------+------------------+-----+---------+--------+ [id number]                    triphasic         +--------+------------------+-----+---------+--------+ PTA     184               1.18 triphasic         +--------+------------------+-----+---------+--------+ DP      162               1.04 triphasic         +--------+------------------+-----+---------+--------+ +--------+------------------+-----+---------+-------+ Left    Lt Pressure (mmHg)IndexWaveform  Comment +--------+------------------+-----+---------+-------+ [id number]                    triphasic        +--------+------------------+-----+---------+-------+ PTA     183               1.17 triphasic         +--------+------------------+-----+---------+-------+ DP      181               1.16 triphasic        +--------+------------------+-----+---------+-------+ +-------+---------------+----------------+ ABI/TBIToday's ABI/TBIPrevious ABI/TBI +-------+---------------+----------------+ Right  1.18                            +-------+---------------+----------------+ Left   1.17                            +-------+---------------+----------------+  Right Doppler Findings: +--------+--------+-----+---------+--------+ Site    PressureIndexDoppler  Comments +--------+--------+-----+---------+--------+ [id number]          triphasic         +--------+--------+-----+---------+--------+ Radial               triphasic         +--------+--------+-----+---------+--------+ Ulnar                triphasic         +--------+--------+-----+---------+--------+  Left Doppler Findings: +--------+--------+-----+---------+--------+ Site    PressureIndexDoppler  Comments +--------+--------+-----+---------+--------+ [id number]          triphasic         +--------+--------+-----+---------+--------+ Radial               triphasic         +--------+--------+-----+---------+--------+ Ulnar                triphasic         +--------+--------+-----+---------+--------+  Summary: Right Carotid: Velocities in the right ICA are consistent with a 40-59%                stenosis. Left Carotid: Velocities in the left ICA are consistent with a 1-39% stenosis. Vertebrals: Bilateral vertebral arteries demonstrate antegrade flow. Right ABI: Resting right ankle-brachial index is within normal range. No evidence of significant right lower extremity arterial disease. Left ABI: Resting left ankle-brachial index is within normal range. No evidence of significant left lower extremity arterial disease. Right Upper Extremity: Doppler waveform obliterate with right radial compression. Doppler waveforms  remain within normal limits with right ulnar compression. Left Upper Extremity: Doppler waveform obliterate with left radial compression. Doppler waveforms remain within normal limits with left ulnar compression.  Electronically signed by Jamelle Haring on 09/08/2020 at 4:51:22 PM.    Final    Discharge Instructions    Amb Referral to Cardiac Rehabilitation   Complete by: As directed    Diagnosis: Valve Replacement   Valve: Aortic   After initial evaluation and assessments completed: Virtual Based Care may be provided alone or in conjunction with Phase 2 Cardiac Rehab based on patient barriers.: Yes      Discharge Medications: Allergies as of 09/17/2020      Reactions   Codeine Nausea And Vomiting   Demerol Nausea And Vomiting      Medication List    STOP taking these medications   isosorbide mononitrate 30 MG 24 hr tablet Commonly known as: IMDUR   losartan 100 MG tablet Commonly known as: COZAAR   nitroGLYCERIN 0.4  MG SL tablet Commonly known as: NITROSTAT     TAKE these medications   acetaminophen 325 MG tablet Commonly known as: TYLENOL Take 2 tablets (650 mg total) by mouth every 4 (four) hours as needed for headache or mild pain.   apixaban 5 MG Tabs tablet Commonly known as: ELIQUIS Take 5 mg by mouth 2 (two) times daily.   aspirin 81 MG EC tablet Take 1 tablet (81 mg total) by mouth daily. Swallow whole. Start taking on: September 18, 2020   atorvastatin 40 MG tablet Commonly known as: LIPITOR Take 40 mg by mouth at bedtime. Reported on 01/11/2016   carvedilol 12.5 MG tablet Commonly known as: COREG Take 1 tablet (12.5 mg total) by mouth 2 (two) times daily with a meal. What changed:   medication strength  how much to take  when to take this   cholecalciferol 25 MCG (1000 UNIT) tablet Commonly known as: VITAMIN D3 Take 1,000 Units by mouth daily.   ferrous sulfate 325 (65 FE) MG tablet Take 1 tablet by mouth 3 (three) times a week.   furosemide 40 MG  tablet Commonly known as: LASIX Take 1 tablet (40 mg total) by mouth daily. Start taking on: September 18, 2020 What changed:   medication strength  how much to take  when to take this   glipiZIDE 10 MG tablet Commonly known as: GLUCOTROL Take 10 mg by mouth 2 (two) times daily before a meal.   glucose blood test strip Commonly known as: OneTouch Verio Use to test blood sugar 2-3 times daily as instructed. Dx: E11.9   levothyroxine 112 MCG tablet Commonly known as: SYNTHROID Take 224 mcg by mouth daily before breakfast.   metFORMIN 500 MG 24 hr tablet Commonly known as: GLUCOPHAGE-XR TAKE 4 TABLETS BY MOUTH  DAILY WITH DINNER What changed: See the new instructions.   omeprazole 20 MG capsule Commonly known as: PRILOSEC Take 1 capsule by mouth daily.   OneTouch Delica Lancets Fine Misc Use to test blood sugar 2-3 times daily as instructed. Dx: E11.9   OneTouch Verio Flex System w/Device Kit 1 each by Does not apply route daily. Dx: E11.9   potassium chloride SA 20 MEQ tablet Commonly known as: KLOR-CON Take 1 tablet (20 mEq total) by mouth daily. Start taking on: September 18, 2020   PRESERVISION AREDS 2+MULTI VIT PO Take 1 capsule by mouth in the morning and at bedtime. What changed: Another medication with the same name was removed. Continue taking this medication, and follow the directions you see here.   multivitamin with minerals tablet Take 1 tablet by mouth daily. What changed: Another medication with the same name was removed. Continue taking this medication, and follow the directions you see here.   traMADol 50 MG tablet Commonly known as: ULTRAM Take 1 tablet (50 mg total) by mouth every 4 (four) hours as needed for moderate pain.   Ventolin HFA 108 (90 Base) MCG/ACT inhaler Generic drug: albuterol Inhale 1-2 puffs into the lungs every 4 (four) hours as needed for wheezing or shortness of breath.   vitamin B-12 500 MCG tablet Commonly known as:  CYANOCOBALAMIN Take 1 tablet by mouth 2 (two) times daily.      The patient has been discharged on:   1.Beta Blocker:  Yes [  x ]                              No   [   ]  If No, reason:  2.Ace Inhibitor/ARB: Yes [   ]                                     No  [  x  ]                                     If No, reason: titrating BB  3.Statin:   Yes [  x ]                  No  [   ]                  If No, reason:  4.Ecasa:  Yes  [ x  ]                  No   [   ]                  If No, reason:  Follow Up Appointments:  Follow-up Information    Freada Bergeron, MD. Go on 10/02/2020.   Specialties: Cardiology, Radiology Why: Appointment time is at 9:20 am  Contact information: [address] [place] [address] [phone]        Gaye Pollack, MD. Go on 10/18/2020.   Specialty: Cardiothoracic Surgery Why: PA/LAT CXR to be taken (at [facility] which is in the same building as Dr. Vivi Martens office) on 04/13 at1:00 pm;Appointment time is at 1:30 pm Contact information: [address] [place]  [address] [phone]        [facility]. Go on 09/26/2020.   Specialty: Cardiothoracic Surgery Why: Appointment is with nurse only for chest tube suture removal. Appointment time is at 11:00 am Contact information: [place], [place] [facility]. Go on 10/23/2020.   Specialty: Cardiology Why: Appointment time is at 8:30 am Contact information: [address] [id number] [place] [phone]              Signed: Terance Hart [facility] 09/17/2020, 9:46 AM

## 2020-09-11 NOTE — Plan of Care (Signed)
?  Problem: Clinical Measurements: ?Goal: Will remain free from infection ?Outcome: Progressing ?  ?Problem: Clinical Measurements: ?Goal: Diagnostic test results will improve ?Outcome: Progressing ?  ?

## 2020-09-11 NOTE — Anesthesia Postprocedure Evaluation (Signed)
Anesthesia Post Note  Patient: Mason Patton  Procedure(s) Performed: CORONARY ARTERY BYPASS GRAFTING (CABG), ON PUMP, TIMES 2 , USING LEFT INTERNAL MAMMARY ARTERY AND ENDOSCOPICALLY HARVESTED RIGHT GREATER SAPHENOUS VEIN (N/A Chest) AORTIC VALVE REPLACEMENT (AVR) (N/A Chest) CLIPPING OF ATRIAL APPENDAGE (N/A ) TRANSESOPHAGEAL ECHOCARDIOGRAM (TEE) (N/A )     Patient location during evaluation: SICU Anesthesia Type: General Level of consciousness: sedated Pain management: pain level controlled Vital Signs Assessment: post-procedure vital signs reviewed and stable Respiratory status: patient remains intubated per anesthesia plan Cardiovascular status: stable Postop Assessment: no apparent nausea or vomiting Anesthetic complications: no   No complications documented.  Last Vitals:  Vitals:   09/11/20 1500 09/11/20 1620  BP:    Pulse: 80 80  Resp: (!) 21 (!) 21  Temp: 36.6 C 36.8 C  SpO2: 96% 98%    Last Pain:  Vitals:   09/11/20 0455  TempSrc: Axillary  PainSc:                  Catheryn Bacon Issaih Kaus

## 2020-09-11 NOTE — Transfer of Care (Signed)
Immediate Anesthesia Transfer of Care Note  Patient: Mason Patton  Procedure(s) Performed: CORONARY ARTERY BYPASS GRAFTING (CABG), ON PUMP, TIMES 2 , USING LEFT INTERNAL MAMMARY ARTERY AND ENDOSCOPICALLY HARVESTED RIGHT GREATER SAPHENOUS VEIN (N/A Chest) AORTIC VALVE REPLACEMENT (AVR) (N/A Chest) CLIPPING OF ATRIAL APPENDAGE (N/A ) TRANSESOPHAGEAL ECHOCARDIOGRAM (TEE) (N/A )  Patient Location: ICU  Anesthesia Type:General  Level of Consciousness: sedated and Patient remains intubated per anesthesia plan  Airway & Oxygen Therapy: Patient remains intubated per anesthesia plan and Patient placed on Ventilator (see vital sign flow sheet for setting)  Post-op Assessment: Report given to RN and Post -op Vital signs reviewed and stable  Post vital signs: Reviewed and stable  Last Vitals:  Vitals Value Taken Time  BP    Temp 36.7 C 09/11/20 1435  Pulse 80 09/11/20 1435  Resp 17 09/11/20 1435  SpO2 96 % 09/11/20 1435  Vitals shown include unvalidated device data.  Last Pain:  Vitals:   09/11/20 0455  TempSrc: Axillary  PainSc:          Complications: No complications documented.

## 2020-09-11 NOTE — Anesthesia Procedure Notes (Signed)
Procedure Name: Intubation Date/Time: 09/11/2020 7:57 AM Performed by: Elliot Dally, CRNA Pre-anesthesia Checklist: Patient identified, Emergency Drugs available, Suction available and Patient being monitored Patient Re-evaluated:Patient Re-evaluated prior to induction Oxygen Delivery Method: Circle System Utilized Preoxygenation: Pre-oxygenation with 100% oxygen Induction Type: IV induction Ventilation: Mask ventilation without difficulty and Oral airway inserted - appropriate to patient size Laryngoscope Size: Hyacinth Meeker and 2 Grade View: Grade I Tube type: Oral Tube size: 8.0 mm Number of attempts: 1 Airway Equipment and Method: Stylet and Oral airway Placement Confirmation: ETT inserted through vocal cords under direct vision,  positive ETCO2 and breath sounds checked- equal and bilateral Secured at: 23 cm Tube secured with: Tape Dental Injury: Teeth and Oropharynx as per pre-operative assessment

## 2020-09-11 NOTE — Anesthesia Procedure Notes (Signed)
Central Venous Catheter Insertion Performed by: Murvin Natal, MD, anesthesiologist Start/End3/01/2021 7:00 AM, 09/11/2020 7:15 AM Patient location: Pre-op. Preanesthetic checklist: patient identified, IV checked, site marked, risks and benefits discussed, surgical consent, monitors and equipment checked, pre-op evaluation, timeout performed and anesthesia consent Position: Trendelenburg Lidocaine 1% used for infiltration and patient sedated Hand hygiene performed , maximum sterile barriers used  and Seldinger technique used Catheter size: 9 Fr Total catheter length 12. PA cath was placed.MAC introducer Swan type:thermodilution PA Cath depth:48 Procedure performed using ultrasound guided technique. Ultrasound Notes:anatomy identified, needle tip was noted to be adjacent to the nerve/plexus identified, no ultrasound evidence of intravascular and/or intraneural injection and image(s) printed for medical record Attempts: 1 Following insertion, line sutured, dressing applied and Biopatch. Post procedure assessment: blood return through all ports, free fluid flow and no air  Patient tolerated the procedure well with no immediate complications.

## 2020-09-11 NOTE — Plan of Care (Signed)
  Problem: Clinical Measurements: Goal: Will remain free from infection Outcome: Progressing   Problem: Clinical Measurements: Goal: Diagnostic test results will improve Outcome: Progressing   Problem: Pain Managment: Goal: General experience of comfort will improve Outcome: Progressing   

## 2020-09-11 NOTE — Anesthesia Procedure Notes (Signed)
Arterial Line Insertion Start/End3/01/2021 6:45 AM, 09/11/2020 6:55 AM Performed by: Elliot Dally, CRNA  Patient location: Pre-op. Preanesthetic checklist: patient identified, IV checked, site marked, risks and benefits discussed, surgical consent, monitors and equipment checked, pre-op evaluation, timeout performed and anesthesia consent Patient sedated Left, radial was placed Catheter size: 20 G Hand hygiene performed  and maximum sterile barriers used  Allen's test indicative of satisfactory collateral circulation Attempts: 1 Procedure performed without using ultrasound guided technique. Following insertion, dressing applied and Biopatch. Patient tolerated the procedure well with no immediate complications.

## 2020-09-11 NOTE — Brief Op Note (Signed)
09/05/2020 - 09/11/2020  12:28 PM  PATIENT:  Mason Patton  75 y.o. male  PRE-OPERATIVE DIAGNOSIS:  1. Severe  Aortic Stenosis 2.Coronary Artery Disease 3. Atrial fibrillation   POST-OPERATIVE DIAGNOSIS:  1. Severe  Aortic Stenosis 2.Coronary Artery Disease 3. Atrial fibrillation   PROCEDURE:  TRANSESOPHAGEAL ECHOCARDIOGRAM (TEE), CORONARY ARTERY BYPASS GRAFTING (CABG) x 2 (LIMA to LAD and SVG to PDA), ON PUMP,  USING LEFT INTERNAL MAMMARY ARTERY AND ENDOSCOPICALLY HARVESTED RIGHT GREATER SAPHENOUS VEIN, AORTIC VALVE REPLACEMENT (AVR) (using an Inspiris Model# [id number], Size 25 mm, Serial # [id number]), and CLIPPING OF ATRIAL APPENDAGE (size 45) EVH HARVEST TIME: 22 minutes; EVH PREP TIME:14 minutes  SURGEON:  Surgeon(s) and Role:    Alleen Borne, MD - Primary  PHYSICIAN ASSISTANT: Doree Fudge PA-C  ASSISTANTS: Benay Spice RNFA  ANESTHESIA:   general  EBL:  Per perfusion and anesthesia record  DRAINS: Chest tubes placed in the mediastinal and pleural spaces   SPECIMEN:  Source of Specimen:  Native AV leaflets  DISPOSITION OF SPECIMEN:  PATHOLOGY  COUNTS CORRECT:  YES  DICTATION: .Dragon Dictation  PLAN OF CARE: Admit to inpatient   PATIENT DISPOSITION:  ICU - intubated and hemodynamically stable.   Delay start of Pharmacological VTE agent (>24hrs) due to surgical blood loss or risk of bleeding: yes  BASELINE WEIGHT: 116 kg

## 2020-09-11 NOTE — Interval H&P Note (Signed)
History and Physical Interval Note:  09/11/2020 7:07 AM  Mason Patton  has presented today for surgery, with the diagnosis of CAD AS.  The various methods of treatment have been discussed with the patient and family. After consideration of risks, benefits and other options for treatment, the patient has consented to  Procedure(s): CORONARY ARTERY BYPASS GRAFTING (CABG) (N/A) AORTIC VALVE REPLACEMENT (AVR) (N/A) CLIPPING OF ATRIAL APPENDAGE (N/A) TRANSESOPHAGEAL ECHOCARDIOGRAM (TEE) (N/A) as a surgical intervention.  The patient's history has been reviewed, patient examined, no change in status, stable for surgery.  I have reviewed the patient's chart and labs.  Questions were answered to the patient's satisfaction.     Alleen Borne

## 2020-09-11 NOTE — Discharge Instructions (Signed)

## 2020-09-11 NOTE — Op Note (Signed)
CARDIOVASCULAR SURGERY OPERATIVE NOTE  09/11/2020  Surgeon:  Alleen Borne, MD  First Assistant: Doree Fudge,  PA-C   Preoperative Diagnosis:  Severe multi-vessel coronary artery disease, Severe aortic stenosis, atrial fibrillation.   Postoperative Diagnosis:  Same   Procedure:  1. Median Sternotomy 2. Extracorporeal circulation 3.   Coronary artery bypass grafting x 2   Left internal mammary artery graft to the LAD  SVG to PD   4.   Endoscopic vein harvest from the right leg 5.   Aortic valve replacement using a 25 mm Edwards INSPIRIS RESILIA pericardial valve (model 11500A, SN [id number])   Anesthesia:  General Endotracheal   Clinical History/Surgical Indication:  This 75 year old gentleman has severe two-vessel coronary disease and severe aortic stenosis with New York Heart Association class III symptoms of exertional fatigue and shortness of breath consistent with chronic diastolic congestive heart failure in addition to exertional angina that is becoming more frequent.  His coronary arteries are heavily calcified and not amenable to PCI.  I agree that the best treatment for him is coronary bypass graft surgery and aortic valve replacement with a bioprosthetic valve as well as clipping of his left atrial appendage. I discussed the operative procedure with the patient and his wife including alternatives, benefits and risks; including but not limited to bleeding, blood transfusion, infection, stroke, myocardial infarction, graft failure, heart block requiring a permanent pacemaker, organ dysfunction, and death.  [name] understands and agrees to proceed.  Preparation:  The patient was seen in the preoperative holding area and the correct patient, correct operation were confirmed with the patient after reviewing the medical record and catheterization. The consent was  signed by me. Preoperative antibiotics were given. A pulmonary arterial line and radial arterial line were placed by the anesthesia team. The patient was taken back to the operating room and positioned supine on the operating room table. After being placed under general endotracheal anesthesia by the anesthesia team a foley catheter was placed. The neck, chest, abdomen, and both legs were prepped with betadine soap and solution and draped in the usual sterile manner. A surgical time-out was taken and the correct patient and operative procedure were confirmed with the nursing and anesthesia staff.   Cardiopulmonary Bypass:  A median sternotomy was performed. The pericardium was opened in the midline. Right ventricular function appeared normal. The ascending aorta was of normal size and had no palpable plaque. There were no contraindications to aortic cannulation or cross-clamping. The patient was fully systemically heparinized and the ACT was maintained > 400 sec. The proximal aortic arch was cannulated with a 22 F aortic cannula for arterial inflow. Venous cannulation was performed via the right atrial appendage using a two-staged venous cannula. An antegrade cardioplegia/vent cannula was inserted into the mid-ascending aorta. A retrograde cardioplegia cannula was inserted into the right atrium and advanced into the coronary sinus. A left ventricular vent was placed via the right superior pulmonary vein. Aortic occlusion was performed with a single cross-clamp. Systemic cooling to 32 degrees Centigrade and topical cooling of the heart with iced saline were used. Hyperkalemic antegrade cold blood cardioplegia was used to induce diastolic arrest and was then given at about 20 minute intervals throughout the period of arrest for the bypasses and then given retrograde during the AVR to maintain myocardial temperature at or below 10 degrees centigrade. A temperature probe was inserted into the interventricular  septum and an insulating pad was placed in the pericardium. CO2 was insufflated into the  pericardium to minimize intracardiac air.   Left internal mammary artery harvest:  The left side of the sternum was retracted using the Rultract retractor. The left internal mammary artery was harvested as a pedicle graft. All side branches were clipped. It was a medium-sized vessel of good quality with excellent blood flow. It was ligated distally and divided. It was sprayed with topical papaverine solution to prevent vasospasm.   Endoscopic vein harvest:  The right greater saphenous vein was harvested endoscopically through a 2 cm incision medial to the right knee. It was harvested from the thigh. It was a medium-sized vein of good quality. The side branches were all ligated with 4-0 silk ties.    Coronary arteries:  The coronary arteries were examined.   LAD:  Large vessel with no distal disease  LCX:  No stenosis on cath  RCA:  Diffusely calcified. The PDA and PL had minimal disease.   Grafts:  1. LIMA to the LAD: 2.0 mm. It was sewn end to side using 8-0 prolene continuous suture. 2. SVG to PDA:  1.75 mm. It was sewn end to side using 7-0 prolene continuous suture.   The proximal vein graft anastomosis was performed to the mid-ascending aorta using continuous 6-0 prolene suture. A graft marker was placed around the proximal anastomosis.  Aortic Valve Replacement:   A transverse aortotomy was performed 1 cm above the take-off of the right coronary artery. The native valve was tri-leaflet with calcified leaflets and moderate annular calcification. The ostia of the coronary arteries were in normal position and were not obstructed. The native valve leaflets were excised and the annulus was decalcified with rongeurs. Care was taken to remove all particulate debris. The left ventricle was directly inspected for debris and then irrigated with ice saline solution. The annulus was sized and a size  25 mm Edwards INSPIRIS RESILIA pericardial valve was chosen.  While the valve was being prepared 2-0 Ethibond pledgeted horizontal mattress sutures were placed around the annulus with the pledgets in a sub-annular position. The sutures were placed through the sewing ring and the valve lowered into place. The sutures were tied sequentially. The valve seated nicely and the coronary ostia were not obstructed. The prosthetic valve leaflets moved normally and there was no sub-valvular obstruction. The aortotomy was closed using 4-0 Prolene suture in 2 layers with felt strips to reinforce the closure.  Ligation of left atrial appendage:   The base of the appendage was measured and a 45 mm Atricure Pro 2 clip was chosen. This was placed across the base of the LAA without difficulty.     Completion:  The patient was rewarmed to 37 degrees Centigrade. The clamp was removed from the LIMA pedicle and there was rapid warming of the septum and return of ventricular fibrillation. The crossclamp was removed with a time of 141 minutes. There was spontaneous return of sinus rhythm. The distal and proximal anastomoses were checked for hemostasis. The position of the graft was satisfactory. Two temporary epicardial pacing wires were placed on the right atrium and two on the right ventricle. The patient was weaned from CPB without difficulty on no inotropes. CPB time was 172 minutes. Cardiac output was 5 LPM. TEE showed a normally functioning aortic valve prosthesis with no paravalvular leak and a low mean gradient of 5 mm Hg. Heparin was fully reversed with protamine and the aortic and venous cannulas removed. Hemostasis was achieved. Mediastinal and left pleural drainage tubes were placed. The sternum was closed  with double #6 stainless steel wires. The fascia was closed with continuous # 1 vicryl suture. The subcutaneous tissue was closed with 2-0 vicryl continuous suture. The skin was closed with 3-0 vicryl subcuticular  suture. All sponge, needle, and instrument counts were reported correct at the end of the case. Dry sterile dressings were placed over the incisions and around the chest tubes which were connected to pleurevac suction. The patient was then transported to the surgical intensive care unit in stable condition.

## 2020-09-11 NOTE — Progress Notes (Signed)
T[facility] Evening Rounds  DOS s/p CABG/AVR Remains intubated; fiO2 70% Waking up, following commands  BP (!) 149/77 (BP Location: Right Arm)   Pulse 80   Temp 98.06 F (36.7 C)   Resp 19   Ht 5\' 10"  (1.778 m)   Wt 116.4 kg   SpO2 99%   BMI 36.82 kg/m  CTA RRR (a-v paced)   Intake/Output Summary (Last 24 hours) at 09/11/2020 1759 Last data filed at 09/11/2020 1430 Gross per 24 hour  Intake 4020 ml  Output 2650 ml  Net 1370 ml   Low CT output  A/p:  Wean ventilator Fluids as needed Routine post-op care  Kaycie Pegues Z. 11/11/2020, MD [phone]

## 2020-09-12 ENCOUNTER — Inpatient Hospital Stay (HOSPITAL_COMMUNITY): Payer: No Typology Code available for payment source

## 2020-09-12 ENCOUNTER — Encounter (HOSPITAL_COMMUNITY): Payer: Self-pay | Admitting: Surgery

## 2020-09-12 ENCOUNTER — Other Ambulatory Visit: Payer: Self-pay | Admitting: Cardiology

## 2020-09-12 DIAGNOSIS — I350 Nonrheumatic aortic (valve) stenosis: Secondary | ICD-10-CM | POA: Diagnosis not present

## 2020-09-12 LAB — BASIC METABOLIC PANEL
Anion gap: 10 (ref 5–15)
Anion gap: 9 (ref 5–15)
BUN: 17 mg/dL (ref 8–23)
BUN: 23 mg/dL (ref 8–23)
CO2: 20 mmol/L — ABNORMAL LOW (ref 22–32)
CO2: 22 mmol/L (ref 22–32)
Calcium: 8.6 mg/dL — ABNORMAL LOW (ref 8.9–10.3)
Calcium: 8.7 mg/dL — ABNORMAL LOW (ref 8.9–10.3)
Chloride: 109 mmol/L (ref 98–111)
Chloride: 106 mmol/L (ref 98–111)
Creatinine, Ser: 0.93 mg/dL (ref 0.61–1.24)
Creatinine, Ser: 1.51 mg/dL — ABNORMAL HIGH (ref 0.61–1.24)
GFR, Estimated: >60 mL/min (ref >60)
GFR, Estimated: 48 mL/min — ABNORMAL LOW (ref >60)
Glucose, Bld: 135 mg/dL — ABNORMAL HIGH (ref 70–99)
Glucose, Bld: 180 mg/dL — ABNORMAL HIGH (ref 70–99)
Potassium: 4.6 mmol/L (ref 3.5–5.1)
Potassium: 5 mmol/L (ref 3.5–5.1)
Sodium: 139 mmol/L (ref 135–145)
Sodium: 137 mmol/L (ref 135–145)

## 2020-09-12 LAB — CBC
HCT: 27.8 % — ABNORMAL LOW (ref 39.0–52.0)
HCT: 26.6 % — ABNORMAL LOW (ref 39.0–52.0)
Hemoglobin: 8 g/dL — ABNORMAL LOW (ref 13.0–17.0)
Hemoglobin: 8 g/dL — ABNORMAL LOW (ref 13.0–17.0)
MCH: 21.7 pg — ABNORMAL LOW (ref 26.0–34.0)
MCH: 22.7 pg — ABNORMAL LOW (ref 26.0–34.0)
MCHC: 28.8 g/dL — ABNORMAL LOW (ref 30.0–36.0)
MCHC: 30.1 g/dL (ref 30.0–36.0)
MCV: 75.5 fL — ABNORMAL LOW (ref 80.0–100.0)
MCV: 75.6 fL — ABNORMAL LOW (ref 80.0–100.0)
Platelets: 114 10*3/uL — ABNORMAL LOW (ref 150–400)
Platelets: 125 10*3/uL — ABNORMAL LOW (ref 150–400)
RBC: 3.68 MIL/uL — ABNORMAL LOW (ref 4.22–5.81)
RBC: 3.52 MIL/uL — ABNORMAL LOW (ref 4.22–5.81)
RDW: 17.6 % — ABNORMAL HIGH (ref 11.5–15.5)
RDW: 17.9 % — ABNORMAL HIGH (ref 11.5–15.5)
WBC: 14.7 10*3/uL — ABNORMAL HIGH (ref 4.0–10.5)
WBC: 14.5 10*3/uL — ABNORMAL HIGH (ref 4.0–10.5)
nRBC: 0 % (ref 0.0–0.2)
nRBC: 0 % (ref 0.0–0.2)

## 2020-09-12 LAB — GLUCOSE, CAPILLARY
Glucose-Capillary: 101 mg/dL — ABNORMAL HIGH (ref 70–99)
Glucose-Capillary: 132 mg/dL — ABNORMAL HIGH (ref 70–99)
Glucose-Capillary: 145 mg/dL — ABNORMAL HIGH (ref 70–99)
Glucose-Capillary: 160 mg/dL — ABNORMAL HIGH (ref 70–99)
Glucose-Capillary: 170 mg/dL — ABNORMAL HIGH (ref 70–99)
Glucose-Capillary: 167 mg/dL — ABNORMAL HIGH (ref 70–99)

## 2020-09-12 LAB — MAGNESIUM
Magnesium: 2.9 mg/dL — ABNORMAL HIGH (ref 1.7–2.4)
Magnesium: 2.9 mg/dL — ABNORMAL HIGH (ref 1.7–2.4)

## 2020-09-12 MED ORDER — INSULIN ASPART 100 UNIT/ML ~~LOC~~ SOLN
0.0000 [IU] | Freq: Three times a day (TID) | SUBCUTANEOUS | Status: DC
  Administered 2020-09-13: 2 [IU] via SUBCUTANEOUS
  Administered 2020-09-13: 4 [IU] via SUBCUTANEOUS
  Administered 2020-09-13: 2 [IU] via SUBCUTANEOUS
  Administered 2020-09-13: 4 [IU] via SUBCUTANEOUS
  Administered 2020-09-14 – 2020-09-15 (×5): 2 [IU] via SUBCUTANEOUS
  Administered 2020-09-15: 8 [IU] via SUBCUTANEOUS
  Administered 2020-09-16 – 2020-09-17 (×2): 2 [IU] via SUBCUTANEOUS

## 2020-09-12 MED ORDER — INSULIN ASPART 100 UNIT/ML ~~LOC~~ SOLN
0.0000 [IU] | SUBCUTANEOUS | Status: DC
  Administered 2020-09-12 (×2): 2 [IU] via SUBCUTANEOUS
  Administered 2020-09-12 (×2): 4 [IU] via SUBCUTANEOUS
  Administered 2020-09-12: 2 [IU] via SUBCUTANEOUS

## 2020-09-12 MED ORDER — LOSARTAN POTASSIUM 50 MG PO TABS
50.0000 mg | ORAL_TABLET | Freq: Every day | ORAL | Status: DC
  Administered 2020-09-12: 50 mg via ORAL
  Filled 2020-09-12: qty 1

## 2020-09-12 MED ORDER — CARVEDILOL 12.5 MG PO TABS
12.5000 mg | ORAL_TABLET | Freq: Two times a day (BID) | ORAL | Status: DC
  Administered 2020-09-12 (×2): 12.5 mg via ORAL
  Filled 2020-09-12 (×2): qty 1

## 2020-09-12 MED ORDER — ENOXAPARIN SODIUM 40 MG/0.4ML ~~LOC~~ SOLN
40.0000 mg | Freq: Every day | SUBCUTANEOUS | Status: DC
  Administered 2020-09-12 – 2020-09-15 (×4): 40 mg via SUBCUTANEOUS
  Filled 2020-09-12 (×4): qty 0.4

## 2020-09-12 MED ORDER — CARVEDILOL 6.25 MG PO TABS: 6.2500 mg | ORAL_TABLET | Freq: Two times a day (BID) | ORAL | Status: DC

## 2020-09-12 MED ORDER — ORAL CARE MOUTH RINSE
15.0000 mL | Freq: Two times a day (BID) | OROMUCOSAL | Status: DC
  Administered 2020-09-12 – 2020-09-16 (×10): 15 mL via OROMUCOSAL

## 2020-09-12 MED ORDER — FE FUMARATE-B12-VIT C-FA-IFC PO CAPS
1.0000 | ORAL_CAPSULE | Freq: Two times a day (BID) | ORAL | Status: DC
  Administered 2020-09-12 – 2020-09-17 (×11): 1 via ORAL
  Filled 2020-09-12 (×12): qty 1

## 2020-09-12 MED ORDER — DOPAMINE-DEXTROSE 3.2-5 MG/ML-% IV SOLN
2.0000 ug/kg/min | INTRAVENOUS | Status: DC
  Administered 2020-09-12: 3 ug/kg/min via INTRAVENOUS
  Filled 2020-09-12: qty 250

## 2020-09-12 MED ORDER — FUROSEMIDE 10 MG/ML IJ SOLN
40.0000 mg | Freq: Two times a day (BID) | INTRAMUSCULAR | Status: AC
  Administered 2020-09-12 (×2): 40 mg via INTRAVENOUS
  Filled 2020-09-12 (×2): qty 4

## 2020-09-12 MED ORDER — INSULIN DETEMIR 100 UNIT/ML ~~LOC~~ SOLN
20.0000 [IU] | Freq: Every day | SUBCUTANEOUS | Status: DC
  Administered 2020-09-12: 20 [IU] via SUBCUTANEOUS
  Filled 2020-09-12 (×2): qty 0.2

## 2020-09-12 MED FILL — Electrolyte-R (PH 7.4) Solution: INTRAVENOUS | Qty: 4000 | Status: AC

## 2020-09-12 MED FILL — Lidocaine HCl Local Soln Prefilled Syringe 100 MG/5ML (2%): INTRAMUSCULAR | Qty: 5 | Status: AC

## 2020-09-12 MED FILL — Sodium Bicarbonate IV Soln 8.4%: INTRAVENOUS | Qty: 50 | Status: AC

## 2020-09-12 MED FILL — Thrombin (Recombinant) For Soln 20000 Unit: CUTANEOUS | Qty: 1 | Status: AC

## 2020-09-12 MED FILL — Mannitol IV Soln 20%: INTRAVENOUS | Qty: 500 | Status: AC

## 2020-09-12 MED FILL — Sodium Chloride IV Soln 0.9%: INTRAVENOUS | Qty: 2000 | Status: AC

## 2020-09-12 MED FILL — Heparin Sodium (Porcine) Inj 1000 Unit/ML: INTRAMUSCULAR | Qty: 10 | Status: AC

## 2020-09-12 NOTE — Progress Notes (Signed)
Patient ID: Mason Patton, male   DOB: [birth year], 75 y.o.   MRN: [id number]  TCTS Evening Rounds:  He was hypertensive this am and received half his home dose of Cozaar and Coreg and BP came down today. He has not responded to 40 mg lasix this am or pm and creat has bumped to 1.5 this pm. I think his kidneys are used to higher BP. Will stop Cozaar for now and decrease Coreg. BP tonight is 98/54. Add dop 3 to augment renal perfusion. Hold off on further diuresis.

## 2020-09-12 NOTE — Progress Notes (Signed)
1 Day Post-Op Procedure(s) (LRB): CORONARY ARTERY BYPASS GRAFTING (CABG), ON PUMP, TIMES 2 , USING LEFT INTERNAL MAMMARY ARTERY AND ENDOSCOPICALLY HARVESTED RIGHT GREATER SAPHENOUS VEIN (N/A) AORTIC VALVE REPLACEMENT (AVR) (N/A) CLIPPING OF ATRIAL APPENDAGE (N/A) TRANSESOPHAGEAL ECHOCARDIOGRAM (TEE) (N/A) Subjective: Complains of pain but otherwise ok  Objective: Vital signs in last 24 hours: Temp:  [97.16 F (36.2 C)-98.24 F (36.8 C)] 97.34 F (36.3 C) (03/08 0500) Pulse Rate:  [79-80] 80 (03/08 0500) Cardiac Rhythm: A-V Sequential paced (03/07 1942) Resp:  [15-24] 16 (03/08 0500) BP: (93-144)/(62-76) 125/76 (03/08 0500) SpO2:  [90 %-100 %] 96 % (03/08 0500) Arterial Line BP: (93-159)/(51-73) 156/66 (03/08 0500) FiO2 (%):  [40 %-100 %] 40 % (03/07 1944) Weight:  [120 kg] 120 kg (03/08 0500)  Hemodynamic parameters for last 24 hours: PAP: (23-42)/(14-25) 40/21 CO:  [3.9 L/min-6.4 L/min] 6.4 L/min CI:  [1.7 L/min/m2-2.8 L/min/m2] 2.8 L/min/m2  Intake/Output from previous day: 03/07 0701 - 03/08 0700 In: 5707.6 [I.V.:2977.2; Blood:770; IV Piggyback:1960.4] Out: 3210 [Urine:1560; Blood:1400; Chest Tube:250] Intake/Output this shift: No intake/output data recorded.  General appearance: alert and cooperative Neurologic: intact Heart: regular rate and rhythm, S1, S2 normal, no murmur Lungs: wheezes bilaterally Extremities: edema mild Wound: dressings dry  Lab Results: Recent Labs    09/11/20 2002 09/11/20 2010 09/11/20 2138 09/12/20 0355  WBC 12.3*  --   --  14.7*  HGB 7.8*   < > 8.8* 8.0*  HCT 26.6*   < > 26.0* 27.8*  PLT PLATELET CLUMPS NOTED ON SMEAR, UNABLE TO ESTIMATE  --   --  114*   < > = values in this interval not displayed.   BMET:  Recent Labs    09/11/20 2002 09/11/20 2010 09/11/20 2138 09/12/20 0355  NA 137   < > 142 139  K 4.4   < > 4.6 4.6  CL 109  --   --  109  CO2 20*  --   --  20*  GLUCOSE 147*  --   --  135*  BUN 19  --   --  17   CREATININE 0.80  --   --  0.93  CALCIUM 8.2*  --   --  8.6*   < > = values in this interval not displayed.    PT/INR:  Recent Labs    09/11/20 1459  LABPROT 18.1*  INR 1.6*   ABG    Component Value Date/Time   PHART 7.438 09/11/2020 2138   HCO3 23.1 09/11/2020 2138   TCO2 24 09/11/2020 2138   ACIDBASEDEF 1.0 09/11/2020 2138   O2SAT 96.0 09/11/2020 2138   CBG (last 3)  Recent Labs    09/11/20 2342 09/12/20 0139 09/12/20 0358  GLUCAP 117* 101* 132*   CXR: mild left base atelectasis  ECG: sinus   Assessment/Plan: S/P Procedure(s) (LRB): CORONARY ARTERY BYPASS GRAFTING (CABG), ON PUMP, TIMES 2 , USING LEFT INTERNAL MAMMARY ARTERY AND ENDOSCOPICALLY HARVESTED RIGHT GREATER SAPHENOUS VEIN (N/A) AORTIC VALVE REPLACEMENT (AVR) (N/A) CLIPPING OF ATRIAL APPENDAGE (N/A) TRANSESOPHAGEAL ECHOCARDIOGRAM (TEE) (N/A)  POD 1 Hypertensive. Will change Lopressor to Coreg which he was on preop. Continue Cozaar at 50 mg daily for now.  Volume excess: Wt is 7-8 lbs over preop. Start diuresis.  Preop anemia with expected postop acute blood loss anemia: stable. Start iron.  DM: preop Hgb A1c 7.4 on oral meds. Start Levemir and SSI.  DC chest tubes, swan, arterial line.  IS, mobilize.  Hx of chronic atrial fib. He is in  sinus now. Plan to start Eliquis back at discharge.   LOS: 6 days    Mason Patton 09/12/2020

## 2020-09-12 NOTE — Progress Notes (Signed)
Paged Joycelyn Man PA in regards to low UOP (20 mL/hr) despite 40 mg lasix. Ordered to give 1800 dose of lasix early. Will mention to [facility] MD during evening rounds as well.

## 2020-09-12 NOTE — Progress Notes (Signed)
Progress Note  Patient Name: Mason Patton Date of Encounter: 09/12/2020  [facility] Cardiologist: [name], MD   Subjective   Pleasant, jovial.  No significant chest pain or shortness of breath  Inpatient Medications    Scheduled Meds: . sodium chloride   Intravenous Once  . acetaminophen  1,000 mg Oral Q6H   Or  . acetaminophen (TYLENOL) oral liquid 160 mg/5 mL  1,000 mg Per Tube Q6H  . aspirin EC  325 mg Oral Daily   Or  . aspirin  324 mg Per Tube Daily  . atorvastatin  40 mg Oral QHS  . bisacodyl  10 mg Oral Daily   Or  . bisacodyl  10 mg Rectal Daily  . carvedilol  12.5 mg Oral BID WC  . Chlorhexidine Gluconate Cloth  6 each Topical Daily  . docusate sodium  200 mg Oral Daily  . enoxaparin (LOVENOX) injection  40 mg Subcutaneous QHS  . ferrous fumarate-b12-vitamic C-folic acid  1 capsule Oral BID PC  . furosemide  40 mg Intravenous BID  . insulin aspart  0-24 Units Subcutaneous Q4H  . levothyroxine  224 mcg Oral QAC breakfast  . losartan  100 mg Oral Daily  . mouth rinse  15 mL Mouth Rinse BID  . mupirocin ointment  1 application Nasal BID  . [START ON 09/13/2020] pantoprazole  40 mg Oral Daily  . sodium chloride flush  10-40 mL Intracatheter Q12H  . sodium chloride flush  3 mL Intravenous Q12H  . sodium chloride flush  3 mL Intravenous Q12H  . sodium chloride flush  3 mL Intravenous Q12H   Continuous Infusions: . sodium chloride    . sodium chloride    . sodium chloride    . sodium chloride    . sodium chloride 20 mL/hr (09/12/20 0612)  . cefUROXime (ZINACEF)  IV Stopped (09/11/20 2019)  . famotidine (PEPCID) IV Stopped (09/11/20 1539)  . lactated ringers    . lactated ringers Stopped (09/12/20 0520)  . nitroGLYCERIN 5 mcg/min (09/12/20 0607)   PRN Meds: sodium chloride, sodium chloride, sodium chloride, acetaminophen, dextrose, levalbuterol, metoprolol tartrate, morphine injection, ondansetron (ZOFRAN) IV, sodium chloride flush, sodium  chloride flush, sodium chloride flush, sodium chloride flush, traMADol   Vital Signs    Vitals:   09/12/20 0300 09/12/20 0400 09/12/20 0410 09/12/20 0500  BP: 125/72 131/74  125/76  Pulse: 80 80 80 80  Resp: 17 16 16 16   Temp: (!) 97.34 F (36.3 C) (!) 97.34 F (36.3 C) (!) 97.16 F (36.2 C) (!) 97.34 F (36.3 C)  TempSrc:      SpO2: 95% 95% 96% 96%  Weight:    120 kg  Height:        Intake/Output Summary (Last 24 hours) at 09/12/2020 0811 Last data filed at 09/12/2020 0600 Gross per 24 hour  Intake 5407.58 ml  Output 3210 ml  Net 2197.58 ml   Last 3 Weights 09/12/2020 09/11/2020 09/10/2020  Weight (lbs) 264 lb 8.8 oz 256 lb 9.6 oz 258 lb 13.1 oz  Weight (kg) 120 kg 116.393 kg 117.4 kg      Telemetry    Sinus rhythm.  Prior AV pacing.  Now intrinsic rhythm with interventricular conduction delay- Personally Reviewed  ECG    Sinus rhythm with first-degree AV block PR interval 256 ms- Personally Reviewed  Physical Exam   GEN: No acute distress.   Neck: No JVD, Swan-Ganz catheter in place Cardiac: RRR, no murmurs, rubs, or  gallops.  Chest scar dressed Respiratory: Clear to auscultation bilaterally. GI: Soft, nontender, non-distended  MS:  Minimal lower extremity edema; No deformity. Neuro:  Nonfocal  Psych: Normal affect   Labs    High Sensitivity Troponin:   Recent Labs  Lab 09/05/20 2141 09/06/20 0043  TROPONINIHS 17 20*      Chemistry Recent Labs  Lab 09/05/20 2141 09/07/20 0214 09/11/20 0436 09/11/20 0801 09/11/20 1315 09/11/20 1459 09/11/20 2002 09/11/20 2010 09/11/20 2138 09/12/20 0355  NA 141   < > 138   < > 142   < > 137 141 142 139  K 3.5   < > 3.9   < > 4.2   < > 4.4 4.5 4.6 4.6  CL 103   < > 104   < > 105  --  109  --   --  109  CO2 28   < > 23  --   --   --  20*  --   --  20*  GLUCOSE 240*   < > 129*   < > 145*  --  147*  --   --  135*  BUN 21   < > 14   < > 14  --  19  --   --  17  CREATININE 0.94   < > 0.78   < > 0.70  --  0.80  --   --   0.93  CALCIUM 9.2   < > 8.8*  --   --   --  8.2*  --   --  8.6*  PROT 6.1*  --   --   --   --   --   --   --   --   --   ALBUMIN 3.5  --   --   --   --   --   --   --   --   --   AST 23  --   --   --   --   --   --   --   --   --   ALT 21  --   --   --   --   --   --   --   --   --   ALKPHOS 86  --   --   --   --   --   --   --   --   --   BILITOT 1.1  --   --   --   --   --   --   --   --   --   GFRNONAA >60   < > >60  --   --   --  >60  --   --  >60  ANIONGAP 10   < > 11  --   --   --  8  --   --  10   < > = values in this interval not displayed.     Hematology Recent Labs  Lab 09/11/20 1459 09/11/20 1901 09/11/20 2002 09/11/20 2010 09/11/20 2138 09/12/20 0355  WBC 10.7*  --  12.3*  --   --  14.7*  RBC 3.62*  --  3.57*  --   --  3.68*  HGB 9.9*  8.0*   < > 7.8* 8.8* 8.8* 8.0*  HCT 29.0*  26.8*   < > 26.6* 26.0* 26.0* 27.8*  MCV 74.0*  --  74.5*  --   --  75.5*  MCH 22.1*  --  21.8*  --   --  21.7*  MCHC 29.9*  --  29.3*  --   --  28.8*  RDW 17.5*  --  17.4*  --   --  17.6*  PLT PLATELET CLUMPS NOTED ON SMEAR, UNABLE TO ESTIMATE  --  PLATELET CLUMPS NOTED ON SMEAR, UNABLE TO ESTIMATE  --   --  114*   < > = values in this interval not displayed.    BNP Recent Labs  Lab 09/05/20 1651 09/06/20 0703  BNP  --  803.0*  PROBNP 3,817*  --      DDimer No results for input(s): DDIMER in the last 168 hours.   Radiology    DG Chest 2 View  Result Date: 09/10/2020 CLINICAL DATA:  Preop EXAM: CHEST - 2 VIEW COMPARISON:  September 06, 2020 FINDINGS: The cardiomediastinal silhouette is unchanged in contour.Atherosclerotic calcifications of the aorta. Tortuous thoracic aorta. Unchanged lucency the RIGHT lung base likely due to skin fold. No pleural effusion. No pneumothorax. No acute pleuroparenchymal abnormality. Visualized abdomen is unremarkable. Multilevel degenerative changes of the thoracic spine. IMPRESSION: No acute cardiopulmonary abnormality. Electronically Signed   By:  Meda Klinefelter MD   On: 09/10/2020 17:58   DG Chest Port 1 View  Result Date: 09/12/2020 CLINICAL DATA:  Chest tube.  Sore chest.  Shortness of breath EXAM: PORTABLE CHEST 1 VIEW COMPARISON:  09/11/2020. FINDINGS: Interim extubation and removal of NG tube. Mediastinal drainage catheter, Swan-Ganz catheter, left chest tube in stable position. Prior cardiac valve replacement. Cardiomegaly. Low lung volumes with persistent left base atelectasis. Mild left base infiltrate cannot be excluded. Small left pleural effusion. No pneumothorax. IMPRESSION: 1. Interim extubation and removal of NG tube. Mediastinal drainage catheter, Swan-Ganz catheter, left chest tube in stable position. No pneumothorax. 2. Prior cardiac valve replacement. Cardiomegaly. 3. Low lung volumes with persistent left base atelectasis. Mild left base infiltrate cannot be excluded. Small left pleural effusion. Electronically Signed   By: Maisie Fus  Register   On: 09/12/2020 05:39   DG Chest Port 1 View  Result Date: 09/11/2020 CLINICAL DATA:  Postop CABG. EXAM: PORTABLE CHEST 1 VIEW COMPARISON:  09/10/2020. FINDINGS: Postop open heart surgery with aortic valve replacement and left atrial appendage clipping. Endotracheal tube in good position. Swan-Ganz catheter in the main pulmonary artery. Left chest tube in place. Mediastinal drain in place. No pneumothorax. Left lower lobe atelectasis. No significant effusion. IMPRESSION: Postop open heart surgery. Negative for pneumothorax or heart failure. Left lower lobe atelectasis. Electronically Signed   By: Marlan Palau M.D.   On: 09/11/2020 14:57   ECHO INTRAOPERATIVE TEE  Result Date: 09/11/2020  *INTRAOPERATIVE TRANSESOPHAGEAL REPORT *  [name] Date of Exam: 09/11/2020 Medical Rec #:  [id number]        Height:       70.0 in Accession #:    [id number]       Weight:       256.6 lb Date of Birth:  12-17-1945        BSA:          2.32 m Patient Age:    74 years         BP:            183/77 mmHg Patient Gender: M                HR:           68  bpm. Exam Location:  Anesthesiology Transesophogeal exam was perform intraoperatively during surgical procedure. Patient was closely monitored under general anesthesia during the entirety of examination. 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 POST-OP IMPRESSIONS - Left Ventricle: The left ventricle is unchanged from pre-bypass. - Right Ventricle: The right ventricle appears unchanged from pre-bypass. - Aorta: The aorta appears unchanged from pre-bypass. - Aortic Valve: There is no regurgitation. No regurgitation post repair. The gradient recorded across the prosthetic valve is within the expected range. Peak of 10 with a mean 431f 5. No perivalvular leak noted. - Mitral Valve: The mitral valve appears unchanged from pre-bypass. - Tricuspid Valve: The tricuspid valve appears unchanged from pre-bypass. - Pulmonic Valve: The pulmonic valve appears unchanged from pre-bypass. - Interatrial Septum: The interatrial septum appears unchanged from pre-bypass. - Pericardium: The pericardium appears unchanged from pre-bypass. PRE-OP FINDINGS  Left Ventricle: The left ventricle has mild-moderately reduced systolic function, with an ejection fraction of 40-45%. The cavity size was normal. There is mildly increased left ventricular wall thickness. There is mild concentric left ventricular hypertrophy. Right Ventricle: The right ventricle has normal systolic function. The cavity was normal. There is no increase in right ventricular wall thickness. Left Atrium: Left atrial size was normal in size. No left atrial/left atrial appendage thrombus was detected. Right Atrium: Right atrial size was normal in size. Interatrial Septum: No atrial level shunt  detected by color flow Doppler. Pericardium: There is no evidence of pericardial effusion. Mitral Valve: The mitral valve is normal in structure. Mitral valve regurgitation is mild by color flow Doppler. Tricuspid Valve: The tricuspid valve was normal in structure. Tricuspid valve regurgitation is trivial by color flow Doppler. Aortic Valve: Thick and calcified. Aortic valve regurgitation was not visualized by color flow Doppler. There is moderate stenosis of the aortic valve. Pulmonic Valve: The pulmonic valve was normal in structure. Pulmonic valve regurgitation is not visualized by color flow Doppler. Aorta: The aortic root, ascending aorta and aortic arch are normal in size and structure.  Karna Christmasyan Ellender MD Electronically signed by Karna Christmasyan Ellender MD Signature Date/Time: 09/11/2020/4:56:07 PM    Final     Cardiac Studies   ECHO this admit   1. Left ventricular ejection fraction, by estimation, is 45 to 50%. The  left ventricle has mildly decreased function. The left ventricle  demonstrates global hypokinesis. Left ventricular diastolic parameters are  indeterminate.  2. Right ventricular systolic function is normal. The right ventricular  size is normal. Tricuspid regurgitation signal is inadequate for assessing  PA pressure.  3. Left atrial size was moderately dilated.  4. Right atrial size was mildly dilated.  5. The mitral valve is normal in structure. Trivial mitral valve  regurgitation. No evidence of mitral stenosis. Moderate mitral annular  calcification.  6. The aortic valve is tricuspid. Aortic valve regurgitation is not  visualized. Severe aortic valve stenosis. Aortic valve area, by VTI  measures 0.67 cm. Aortic valve mean gradient measures 35.0 mmHg.  7. Aortic dilatation noted. 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  8. The inferior vena cava is normal in size with <50% respiratory  variability, suggesting right atrial pressure of 8 mmHg.    Patient Profile  75 y.o. male post aortic valve replacement, CABG LIMA to LAD SVG to posterior descending  Assessment & Plan    Aortic valve replacement -25 mm Edwards INSPIRIS RESILIA pericardial valve   Left atrial appendage clipping  Coronary artery disease -Two-vessel bypass LIMA to LAD and SVG to PDA  Persistent atrial fibrillation -Currently sinus bradycardia with first-degree AV block.  Lets be careful with the carvedilol 12.5.  Prior to his surgery, we did need to pull back to 3.125 because of bradycardia.  Chronic diastolic heart failure -Wedge pressure was 31 at cath. -Overall doing quite well currently.  Continue to monitor.    For questions or updates, please contact [facility] Please consult [email or web address] for contact info under        Signed, Donato Schultz, MD  09/12/2020, 8:11 AM

## 2020-09-13 ENCOUNTER — Inpatient Hospital Stay (HOSPITAL_COMMUNITY): Payer: No Typology Code available for payment source

## 2020-09-13 DIAGNOSIS — I350 Nonrheumatic aortic (valve) stenosis: Secondary | ICD-10-CM | POA: Diagnosis not present

## 2020-09-13 LAB — CBC
HCT: 27.7 % — ABNORMAL LOW (ref 39.0–52.0)
Hemoglobin: 8.3 g/dL — ABNORMAL LOW (ref 13.0–17.0)
MCH: 22.4 pg — ABNORMAL LOW (ref 26.0–34.0)
MCHC: 30 g/dL (ref 30.0–36.0)
MCV: 74.7 fL — ABNORMAL LOW (ref 80.0–100.0)
Platelets: 120 10*3/uL — ABNORMAL LOW (ref 150–400)
RBC: 3.71 MIL/uL — ABNORMAL LOW (ref 4.22–5.81)
RDW: 18 % — ABNORMAL HIGH (ref 11.5–15.5)
WBC: 15.5 10*3/uL — ABNORMAL HIGH (ref 4.0–10.5)
nRBC: 0 % (ref 0.0–0.2)

## 2020-09-13 LAB — BASIC METABOLIC PANEL
Anion gap: 9 (ref 5–15)
BUN: 25 mg/dL — ABNORMAL HIGH (ref 8–23)
CO2: 22 mmol/L (ref 22–32)
Calcium: 9.2 mg/dL (ref 8.9–10.3)
Chloride: 107 mmol/L (ref 98–111)
Creatinine, Ser: 1.33 mg/dL — ABNORMAL HIGH (ref 0.61–1.24)
GFR, Estimated: 56 mL/min — ABNORMAL LOW (ref >60)
Glucose, Bld: 196 mg/dL — ABNORMAL HIGH (ref 70–99)
Potassium: 4.6 mmol/L (ref 3.5–5.1)
Sodium: 138 mmol/L (ref 135–145)

## 2020-09-13 LAB — GLUCOSE, CAPILLARY
Glucose-Capillary: 180 mg/dL — ABNORMAL HIGH (ref 70–99)
Glucose-Capillary: 162 mg/dL — ABNORMAL HIGH (ref 70–99)
Glucose-Capillary: 142 mg/dL — ABNORMAL HIGH (ref 70–99)
Glucose-Capillary: 150 mg/dL — ABNORMAL HIGH (ref 70–99)
Glucose-Capillary: 125 mg/dL — ABNORMAL HIGH (ref 70–99)

## 2020-09-13 LAB — SURGICAL PATHOLOGY

## 2020-09-13 MED ORDER — CARVEDILOL 3.125 MG PO TABS
3.1250 mg | ORAL_TABLET | Freq: Two times a day (BID) | ORAL | Status: DC
  Administered 2020-09-13: 3.125 mg via ORAL
  Filled 2020-09-13: qty 1

## 2020-09-13 MED ORDER — INSULIN DETEMIR 100 UNIT/ML ~~LOC~~ SOLN
25.0000 [IU] | Freq: Every day | SUBCUTANEOUS | Status: DC
  Administered 2020-09-13 – 2020-09-17 (×5): 25 [IU] via SUBCUTANEOUS
  Filled 2020-09-13 (×5): qty 0.25

## 2020-09-13 MED ORDER — TRAMADOL HCL 50 MG PO TABS
50.0000 mg | ORAL_TABLET | ORAL | Status: DC | PRN
Start: 2020-09-13 — End: 2020-09-17
  Administered 2020-09-13 – 2020-09-14 (×3): 50 mg via ORAL
  Filled 2020-09-13 (×4): qty 1

## 2020-09-13 NOTE — Progress Notes (Signed)
Progress Note  Patient Name: Mason Patton Date of Encounter: 09/13/2020  [facility] Cardiologist: [name], MD   Subjective   Ambulated yesterday. Sleeping now with BIPAP  Inpatient Medications    Scheduled Meds: . sodium chloride   Intravenous Once  . acetaminophen  1,000 mg Oral Q6H   Or  . acetaminophen (TYLENOL) oral liquid 160 mg/5 mL  1,000 mg Per Tube Q6H  . aspirin EC  325 mg Oral Daily   Or  . aspirin  324 mg Per Tube Daily  . atorvastatin  40 mg Oral QHS  . bisacodyl  10 mg Oral Daily   Or  . bisacodyl  10 mg Rectal Daily  . carvedilol  3.125 mg Oral BID WC  . Chlorhexidine Gluconate Cloth  6 each Topical Daily  . docusate sodium  200 mg Oral Daily  . enoxaparin (LOVENOX) injection  40 mg Subcutaneous QHS  . ferrous fumarate-b12-vitamic C-folic acid  1 capsule Oral BID PC  . insulin aspart  0-24 Units Subcutaneous TID AC & HS  . insulin detemir  25 Units Subcutaneous Daily  . levothyroxine  224 mcg Oral QAC breakfast  . mouth rinse  15 mL Mouth Rinse BID  . pantoprazole  40 mg Oral Daily  . sodium chloride flush  10-40 mL Intracatheter Q12H  . sodium chloride flush  3 mL Intravenous Q12H  . sodium chloride flush  3 mL Intravenous Q12H  . sodium chloride flush  3 mL Intravenous Q12H   Continuous Infusions: . sodium chloride    . sodium chloride    . sodium chloride    . sodium chloride    . sodium chloride 20 mL/hr at 09/13/20 0600  . DOPamine 2 mcg/kg/min (09/13/20 0830)  . lactated ringers    . lactated ringers Stopped (09/12/20 0520)  . nitroGLYCERIN 5 mcg/min (09/12/20 0607)   PRN Meds: sodium chloride, sodium chloride, sodium chloride, acetaminophen, dextrose, levalbuterol, metoprolol tartrate, morphine injection, ondansetron (ZOFRAN) IV, sodium chloride flush, sodium chloride flush, sodium chloride flush, sodium chloride flush, traMADol   Vital Signs    Vitals:   09/13/20 0500 09/13/20 0600 09/13/20 0800 09/13/20 0900  BP:  109/60 122/78 105/68 (!) 105/58  Pulse: 85 88 80 72  Resp: 14 15 12  (!) 9  Temp:      TempSrc:      SpO2: 93% 92% 92% 94%  Weight:      Height:        Intake/Output Summary (Last 24 hours) at 09/13/2020 0941 Last data filed at 09/13/2020 0900 Gross per 24 hour  Intake 689.6 ml  Output 895 ml  Net -205.4 ml   Last 3 Weights 09/12/2020 09/11/2020 09/10/2020  Weight (lbs) 264 lb 8.8 oz 256 lb 9.6 oz 258 lb 13.1 oz  Weight (kg) 120 kg 116.393 kg 117.4 kg      Telemetry    Sinus rhythm.  Prior AV pacing.  Now intrinsic rhythm with interventricular conduction delay- Personally Reviewed  ECG    09/12/20 - post op Sinus rhythm with first-degree AV block PR interval 256 ms- Personally Reviewed  Physical Exam   GEN: Well nourished, well developed, in no acute distress  HEENT: normal BIPAP Neck: no JVD, carotid bruits, or masses Cardiac: irreg; no murmurs, rubs, or gallops,no edema  Respiratory:  clear to auscultation bilaterally, normal work of breathing GI: soft, nontender, nondistended, + BS MS: no deformity or atrophy  Skin: warm and dry, no rash Neuro:  Alert  and Oriented x 3, Strength and sensation are intact Psych: euthymic mood, full affect   Labs    High Sensitivity Troponin:   Recent Labs  Lab 09/05/20 2141 09/06/20 0043  TROPONINIHS 17 20*      Chemistry Recent Labs  Lab 09/12/20 0355 09/12/20 1517 09/13/20 0419  NA 139 137 138  K 4.6 5.0 4.6  CL 109 106 107  CO2 20* 22 22  GLUCOSE 135* 180* 196*  BUN 17 23 25*  CREATININE 0.93 1.51* 1.33*  CALCIUM 8.6* 8.7* 9.2  GFRNONAA >60 48* 56*  ANIONGAP 10 9 9      Hematology Recent Labs  Lab 09/12/20 0355 09/12/20 1517 09/13/20 0419  WBC 14.7* 14.5* 15.5*  RBC 3.68* 3.52* 3.71*  HGB 8.0* 8.0* 8.3*  HCT 27.8* 26.6* 27.7*  MCV 75.5* 75.6* 74.7*  MCH 21.7* 22.7* 22.4*  MCHC 28.8* 30.1 30.0  RDW 17.6* 17.9* 18.0*  PLT 114* 125* 120*    BNP No results for input(s): BNP, PROBNP in the last 168 hours.    DDimer No results for input(s): DDIMER in the last 168 hours.   Radiology    DG Chest Port 1 View  Result Date: 09/13/2020 CLINICAL DATA:  Open-heart surgery. EXAM: PORTABLE CHEST 1 VIEW COMPARISON:  09/2020. FINDINGS: Interim removal of Swan-Ganz catheter, mediastinal drainage catheter, and left chest tube. Right IJ sheath in stable position. Prior cardiac valve replacement. Left atrial appendage clip in stable position. Stable cardiomegaly. Mild left base atelectasis and small left pleural effusion again noted. No pneumothorax. IMPRESSION: 1. Interim removal of Swan-Ganz catheter, mediastinal drainage catheter, and left chest tube. Right IJ sheath in stable position. No pneumothorax. 2.  Prior cardiac valve replacement.  Stable cardiomegaly. 3. Low lung volumes with persistent left base atelectasis. Small left pleural effusion cannot be excluded. No interim change. Electronically Signed   By: 10/2020  Register   On: 09/13/2020 06:28   DG Chest Port 1 View  Result Date: 09/12/2020 CLINICAL DATA:  Chest tube.  Sore chest.  Shortness of breath EXAM: PORTABLE CHEST 1 VIEW COMPARISON:  09/11/2020. FINDINGS: Interim extubation and removal of NG tube. Mediastinal drainage catheter, Swan-Ganz catheter, left chest tube in stable position. Prior cardiac valve replacement. Cardiomegaly. Low lung volumes with persistent left base atelectasis. Mild left base infiltrate cannot be excluded. Small left pleural effusion. No pneumothorax. IMPRESSION: 1. Interim extubation and removal of NG tube. Mediastinal drainage catheter, Swan-Ganz catheter, left chest tube in stable position. No pneumothorax. 2. Prior cardiac valve replacement. Cardiomegaly. 3. Low lung volumes with persistent left base atelectasis. Mild left base infiltrate cannot be excluded. Small left pleural effusion. Electronically Signed   By: 11/11/2020  Register   On: 09/12/2020 05:39   DG Chest Port 1 View  Result Date: 09/11/2020 CLINICAL DATA:  Postop  CABG. EXAM: PORTABLE CHEST 1 VIEW COMPARISON:  09/10/2020. FINDINGS: Postop open heart surgery with aortic valve replacement and left atrial appendage clipping. Endotracheal tube in good position. Swan-Ganz catheter in the main pulmonary artery. Left chest tube in place. Mediastinal drain in place. No pneumothorax. Left lower lobe atelectasis. No significant effusion. IMPRESSION: Postop open heart surgery. Negative for pneumothorax or heart failure. Left lower lobe atelectasis. Electronically Signed   By: 11/10/2020 M.D.   On: 09/11/2020 14:57    Cardiac Studies   ECHO this admit   1. Left ventricular ejection fraction, by estimation, is 45 to 50%. The  left ventricle has mildly decreased function. The left ventricle  demonstrates global  hypokinesis. Left ventricular diastolic parameters are  indeterminate.  2. Right ventricular systolic function is normal. The right ventricular  size is normal. Tricuspid regurgitation signal is inadequate for assessing  PA pressure.  3. Left atrial size was moderately dilated.  4. Right atrial size was mildly dilated.  5. The mitral valve is normal in structure. Trivial mitral valve  regurgitation. No evidence of mitral stenosis. Moderate mitral annular  calcification.  6. The aortic valve is tricuspid. Aortic valve regurgitation is not  visualized. Severe aortic valve stenosis. Aortic valve area, by VTI  measures 0.67 cm. Aortic valve mean gradient measures 35.0 mmHg.  7. Aortic dilatation noted. 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  8. The inferior vena cava is normal in size with <50% respiratory  variability, suggesting right atrial pressure of 8 mmHg.   Patient Profile     75 y.o. 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  Lets be careful with the carvedilol 12.5.  Prior to his surgery, we did need to pull back to 3.125 because of bradycardia. Continue with 3.125 for now. Now in AFIB with good rate control.   Chronic diastolic heart failure -Wedge pressure was 31 at cath. -Overall doing quite well currently.  Continue to monitor.  AKI  - losartan held, low dose dopamine. Improved.    For questions or updates, please contact [facility] Please consult [email or web address] for contact info under        Signed, Donato Schultz, MD  09/13/2020, 9:41 AM

## 2020-09-13 NOTE — Progress Notes (Addendum)
2 Days Post-Op Procedure(s) (LRB): CORONARY ARTERY BYPASS GRAFTING (CABG), ON PUMP, TIMES 2 , USING LEFT INTERNAL MAMMARY ARTERY AND ENDOSCOPICALLY HARVESTED RIGHT GREATER SAPHENOUS VEIN (N/A) AORTIC VALVE REPLACEMENT (AVR) (N/A) CLIPPING OF ATRIAL APPENDAGE (N/A) TRANSESOPHAGEAL ECHOCARDIOGRAM (TEE) (N/A) Subjective: No complaints. Slept well. Ambulated this am.  Objective: Vital signs in last 24 hours: Temp:  [97.52 F (36.4 C)-98.1 F (36.7 C)] 97.9 F (36.6 C) (03/09 0400) Pulse Rate:  [39-90] 88 (03/09 0600) Cardiac Rhythm: Normal sinus rhythm (03/08 2000) Resp:  [12-23] 15 (03/09 0600) BP: (98-138)/(54-109) 122/78 (03/09 0600) SpO2:  [88 %-100 %] 92 % (03/09 0600) Arterial Line BP: (157-180)/(67-79) 157/67 (03/08 1015)  Hemodynamic parameters for last 24 hours: PAP: (30-43)/(17-25) 36/21  Intake/Output from previous day: 03/08 0701 - 03/09 0700 In: 770.2 [I.V.:570; IV Piggyback:200.1] Out: 940 [Urine:880; Chest Tube:60] Intake/Output this shift: No intake/output data recorded.  General appearance: alert and cooperative Neurologic: intact Heart: regular rate and rhythm, S1, S2 normal, no murmur Lungs: clear to auscultation bilaterally Extremities: edema mild Wound: dressings dry  Lab Results: Recent Labs    09/12/20 1517 09/13/20 0419  WBC 14.5* 15.5*  HGB 8.0* 8.3*  HCT 26.6* 27.7*  PLT 125* 120*   BMET:  Recent Labs    09/12/20 1517 09/13/20 0419  NA 137 138  K 5.0 4.6  CL 106 107  CO2 22 22  GLUCOSE 180* 196*  BUN 23 25*  CREATININE 1.51* 1.33*  CALCIUM 8.7* 9.2    PT/INR:  Recent Labs    09/11/20 1459  LABPROT 18.1*  INR 1.6*   ABG    Component Value Date/Time   PHART 7.438 09/11/2020 2138   HCO3 23.1 09/11/2020 2138   TCO2 24 09/11/2020 2138   ACIDBASEDEF 1.0 09/11/2020 2138   O2SAT 96.0 09/11/2020 2138   CBG (last 3)  Recent Labs    09/12/20 1516 09/12/20 1935 09/13/20 0610  GLUCAP 170* 167* 180*   CXR: mild left basilar  atelectasis. May be tiny effusion.  Assessment/Plan: S/P Procedure(s) (LRB): CORONARY ARTERY BYPASS GRAFTING (CABG), ON PUMP, TIMES 2 , USING LEFT INTERNAL MAMMARY ARTERY AND ENDOSCOPICALLY HARVESTED RIGHT GREATER SAPHENOUS VEIN (N/A) AORTIC VALVE REPLACEMENT (AVR) (N/A) CLIPPING OF ATRIAL APPENDAGE (N/A) TRANSESOPHAGEAL ECHOCARDIOGRAM (TEE) (N/A)  POD 2  Hemodynamically stable on dop 3. Dop added last night for bump in creat and SBP high 90's after Cozaar and Coreg. Cozaar stopped for now. Will decrease dop to 2. Continue low dose Coreg.  Rhythm is back to rate controlled atrial fib as preop. He has been in chronic persistent AF. Plan to resume Eliquis at discharge. He has LAA clip.  Acute kidney injury secondary to DM, pump run, Cozaar and relative hypotension yesterday. Improved to 1.3 this am. Will continue observation and hold off on diuretic today.  Volume excess: start diuresis tomorrow if creat continues to improve.  Preop anemia: stable, continue iron.  Work on [employer], ambulation.    LOS: 7 days    Alleen Borne 09/13/2020

## 2020-09-13 NOTE — Progress Notes (Signed)
EVENING ROUNDS NOTE :     [address]       [place] [address]             [phone]                 2 Days Post-Op Procedure(s) (LRB): CORONARY ARTERY BYPASS GRAFTING (CABG), ON PUMP, TIMES 2 , USING LEFT INTERNAL MAMMARY ARTERY AND ENDOSCOPICALLY HARVESTED RIGHT GREATER SAPHENOUS VEIN (N/A) AORTIC VALVE REPLACEMENT (AVR) (N/A) CLIPPING OF ATRIAL APPENDAGE (N/A) TRANSESOPHAGEAL ECHOCARDIOGRAM (TEE) (N/A)   Total Length of Stay:  LOS: 7 days  Events:   Ambulated today Remains in afib On CPAP     BP 110/62 (BP Location: Right Arm)   Pulse 74   Temp 98.1 F (36.7 C) (Oral)   Resp (!) 24   Ht 5\' 10"  (1.778 m)   Wt 120 kg   SpO2 92%   BMI 37.96 kg/m         . sodium chloride    . sodium chloride    . sodium chloride    . sodium chloride    . sodium chloride 20 mL/hr at 09/13/20 0600  . DOPamine 2 mcg/kg/min (09/13/20 0830)  . lactated ringers    . lactated ringers Stopped (09/12/20 0520)  . nitroGLYCERIN 5 mcg/min (09/12/20 11/12/20)    I/O last 3 completed shifts: In: 2090.6 [I.V.:1054.3; IV Piggyback:1036.3] Out: 1660 [Urine:1410; Chest Tube:250]   CBC Latest Ref Rng & Units 09/13/2020 09/12/2020 09/12/2020  WBC 4.0 - 10.5 K/uL 15.5(H) 14.5(H) 14.7(H)  Hemoglobin 13.0 - 17.0 g/dL 8.3(L) 8.0(L) 8.0(L)  Hematocrit 39.0 - 52.0 % 27.7(L) 26.6(L) 27.8(L)  Platelets 150 - 400 K/uL 120(L) 125(L) 114(L)    BMP Latest Ref Rng & Units 09/13/2020 09/12/2020 09/12/2020  Glucose 70 - 99 mg/dL 11/12/2020) 962(X) 528(U)  BUN 8 - 23 mg/dL 132(G) 23 17  Creatinine 0.61 - 1.24 mg/dL 40(N) 0.27(O) 5.36(U  BUN/Creat Ratio 10 - 24 - - -  Sodium 135 - 145 mmol/L 138 137 139  Potassium 3.5 - 5.1 mmol/L 4.6 5.0 4.6  Chloride 98 - 111 mmol/L 107 106 109  CO2 22 - 32 mmol/L 22 22 20(L)  Calcium 8.9 - 10.3 mg/dL 9.2 4.40) 3.4(V)    ABG    Component Value Date/Time   PHART 7.438 09/11/2020 2138   PCO2ART 34.0 09/11/2020 2138   PO2ART 77 (L) 09/11/2020 2138   HCO3  23.1 09/11/2020 2138   TCO2 24 09/11/2020 2138   ACIDBASEDEF 1.0 09/11/2020 2138   O2SAT 96.0 09/11/2020 2138       2139, MD 09/13/2020 6:06 PM

## 2020-09-14 LAB — TYPE AND SCREEN
ABO/RH(D): A POS
Antibody Screen: NEGATIVE
Unit division: 0
Unit division: 0
Unit division: 0
Unit division: 0

## 2020-09-14 LAB — BASIC METABOLIC PANEL
Anion gap: 6 (ref 5–15)
BUN: 32 mg/dL — ABNORMAL HIGH (ref 8–23)
CO2: 25 mmol/L (ref 22–32)
Calcium: 8.7 mg/dL — ABNORMAL LOW (ref 8.9–10.3)
Chloride: 107 mmol/L (ref 98–111)
Creatinine, Ser: 1.17 mg/dL (ref 0.61–1.24)
GFR, Estimated: >60 mL/min (ref >60)
Glucose, Bld: 116 mg/dL — ABNORMAL HIGH (ref 70–99)
Potassium: 4.1 mmol/L (ref 3.5–5.1)
Sodium: 138 mmol/L (ref 135–145)

## 2020-09-14 LAB — BPAM RBC
Blood Product Expiration Date: 202203252359
Blood Product Expiration Date: 202203252359
Blood Product Expiration Date: 202204012359
Blood Product Expiration Date: 202204012359
ISSUE DATE / TIME: 202203070820
ISSUE DATE / TIME: 202203070820
Unit Type and Rh: 6200
Unit Type and Rh: 6200
Unit Type and Rh: 6200
Unit Type and Rh: 6200

## 2020-09-14 LAB — CBC
HCT: 24.4 % — ABNORMAL LOW (ref 39.0–52.0)
Hemoglobin: 7.2 g/dL — ABNORMAL LOW (ref 13.0–17.0)
MCH: 22.3 pg — ABNORMAL LOW (ref 26.0–34.0)
MCHC: 29.5 g/dL — ABNORMAL LOW (ref 30.0–36.0)
MCV: 75.5 fL — ABNORMAL LOW (ref 80.0–100.0)
Platelets: 127 10*3/uL — ABNORMAL LOW (ref 150–400)
RBC: 3.23 MIL/uL — ABNORMAL LOW (ref 4.22–5.81)
RDW: 18.1 % — ABNORMAL HIGH (ref 11.5–15.5)
WBC: 10.1 10*3/uL (ref 4.0–10.5)
nRBC: 0 % (ref 0.0–0.2)

## 2020-09-14 LAB — GLUCOSE, CAPILLARY
Glucose-Capillary: 121 mg/dL — ABNORMAL HIGH (ref 70–99)
Glucose-Capillary: 144 mg/dL — ABNORMAL HIGH (ref 70–99)
Glucose-Capillary: 148 mg/dL — ABNORMAL HIGH (ref 70–99)
Glucose-Capillary: 114 mg/dL — ABNORMAL HIGH (ref 70–99)

## 2020-09-14 LAB — PREPARE RBC (CROSSMATCH)

## 2020-09-14 MED ORDER — METOLAZONE 2.5 MG PO TABS
2.5000 mg | ORAL_TABLET | Freq: Once | ORAL | Status: AC
  Administered 2020-09-14: 2.5 mg via ORAL
  Filled 2020-09-14: qty 1

## 2020-09-14 MED ORDER — SODIUM CHLORIDE 0.9% IV SOLUTION: Freq: Once | INTRAVENOUS | Status: AC

## 2020-09-14 MED ORDER — FUROSEMIDE 40 MG PO TABS
40.0000 mg | ORAL_TABLET | Freq: Every day | ORAL | Status: DC
  Administered 2020-09-14 – 2020-09-17 (×4): 40 mg via ORAL
  Filled 2020-09-14 (×4): qty 1

## 2020-09-14 NOTE — Plan of Care (Signed)
  Problem: Education: Goal: Ability to demonstrate management of disease process will improve Outcome: Progressing Goal: Ability to verbalize understanding of medication therapies will improve Outcome: Progressing Goal: Individualized Educational Video(s) Outcome: Progressing   Problem: Activity: Goal: Capacity to carry out activities will improve Outcome: Progressing   Problem: Cardiac: Goal: Ability to achieve and maintain adequate cardiopulmonary perfusion will improve Outcome: Progressing   Problem: Education: Goal: Understanding of CV disease, CV risk reduction, and recovery process will improve Outcome: Progressing Goal: Individualized Educational Video(s) Outcome: Progressing   Problem: Activity: Goal: Ability to return to baseline activity level will improve Outcome: Progressing   Problem: Cardiovascular: Goal: Ability to achieve and maintain adequate cardiovascular perfusion will improve Outcome: Progressing Goal: Vascular access site(s) Level 0-1 will be maintained Outcome: Progressing   Problem: Health Behavior/Discharge Planning: Goal: Ability to safely manage health-related needs after discharge will improve Outcome: Progressing   Problem: Education: Goal: Knowledge of General Education information will improve Description: Including pain rating scale, medication(s)/side effects and non-pharmacologic comfort measures Outcome: Progressing   Problem: Health Behavior/Discharge Planning: Goal: Ability to manage health-related needs will improve Outcome: Progressing   Problem: Clinical Measurements: Goal: Ability to maintain clinical measurements within normal limits will improve Outcome: Progressing Goal: Will remain free from infection Outcome: Progressing Goal: Diagnostic test results will improve Outcome: Progressing Goal: Respiratory complications will improve Outcome: Progressing Goal: Cardiovascular complication will be avoided Outcome:  Progressing   Problem: Activity: Goal: Risk for activity intolerance will decrease Outcome: Progressing   Problem: Nutrition: Goal: Adequate nutrition will be maintained Outcome: Progressing   Problem: Coping: Goal: Level of anxiety will decrease Outcome: Progressing   Problem: Elimination: Goal: Will not experience complications related to bowel motility Outcome: Progressing Goal: Will not experience complications related to urinary retention Outcome: Progressing   Problem: Pain Managment: Goal: General experience of comfort will improve Outcome: Progressing   Problem: Safety: Goal: Ability to remain free from injury will improve Outcome: Progressing   Problem: Skin Integrity: Goal: Risk for impaired skin integrity will decrease Outcome: Progressing   Problem: Education: Goal: Will demonstrate proper wound care and an understanding of methods to prevent future damage Outcome: Progressing Goal: Knowledge of disease or condition will improve Outcome: Progressing Goal: Knowledge of the prescribed therapeutic regimen will improve Outcome: Progressing Goal: Individualized Educational Video(s) Outcome: Progressing   Problem: Activity: Goal: Risk for activity intolerance will decrease Outcome: Progressing   Problem: Cardiac: Goal: Will achieve and/or maintain hemodynamic stability Outcome: Progressing   Problem: Clinical Measurements: Goal: Postoperative complications will be avoided or minimized Outcome: Progressing   Problem: Respiratory: Goal: Respiratory status will improve Outcome: Progressing   Problem: Skin Integrity: Goal: Wound healing without signs and symptoms of infection Outcome: Progressing Goal: Risk for impaired skin integrity will decrease Outcome: Progressing   Problem: Urinary Elimination: Goal: Ability to achieve and maintain adequate renal perfusion and functioning will improve Outcome: Progressing

## 2020-09-14 NOTE — Progress Notes (Signed)
Patient was already placed on home CPAP for the night. Sp02=98%

## 2020-09-14 NOTE — Progress Notes (Signed)
Progress Note  Patient Name: NAVJOT LOERA Date of Encounter: 09/14/2020  [facility] Cardiologist: [name], MD   Subjective   Ambulated yesterday. Sleeping now with BIPAP  Inpatient Medications    Scheduled Meds: . sodium chloride   Intravenous Once  . sodium chloride   Intravenous Once  . acetaminophen  1,000 mg Oral Q6H   Or  . acetaminophen (TYLENOL) oral liquid 160 mg/5 mL  1,000 mg Per Tube Q6H  . aspirin EC  325 mg Oral Daily   Or  . aspirin  324 mg Per Tube Daily  . atorvastatin  40 mg Oral QHS  . Chlorhexidine Gluconate Cloth  6 each Topical Daily  . docusate sodium  200 mg Oral Daily  . enoxaparin (LOVENOX) injection  40 mg Subcutaneous QHS  . ferrous fumarate-b12-vitamic C-folic acid  1 capsule Oral BID PC  . furosemide  40 mg Oral Daily  . insulin aspart  0-24 Units Subcutaneous TID AC & HS  . insulin detemir  25 Units Subcutaneous Daily  . levothyroxine  224 mcg Oral QAC breakfast  . mouth rinse  15 mL Mouth Rinse BID  . pantoprazole  40 mg Oral Daily  . sodium chloride flush  10-40 mL Intracatheter Q12H  . sodium chloride flush  3 mL Intravenous Q12H   Continuous Infusions: . sodium chloride    . sodium chloride    . sodium chloride    . sodium chloride    . lactated ringers    . lactated ringers Stopped (09/12/20 0520)   PRN Meds: sodium chloride, sodium chloride, sodium chloride, acetaminophen, dextrose, levalbuterol, morphine injection, ondansetron (ZOFRAN) IV, sodium chloride flush, sodium chloride flush, traMADol   Vital Signs    Vitals:   09/14/20 0600 09/14/20 0700 09/14/20 0800 09/14/20 0822  BP:  109/61 (!) 88/49   Pulse: 95 81 75   Resp: 19 17 12    Temp:    97.9 F (36.6 C)  TempSrc:    Oral  SpO2: 96% 96% 97%   Weight:      Height:        Intake/Output Summary (Last 24 hours) at 09/14/2020 1015 Last data filed at 09/14/2020 0800 Gross per 24 hour  Intake 360 ml  Output 532 ml  Net -172 ml   Last 3 Weights  09/14/2020 09/12/2020 09/11/2020  Weight (lbs) 263 lb 3.7 oz 264 lb 8.8 oz 256 lb 9.6 oz  Weight (kg) 119.4 kg 120 kg 116.393 kg      Telemetry    Sinus rhythm.  Prior AV pacing.  Now intrinsic rhythm with interventricular conduction delay- Personally Reviewed  ECG    09/12/20 - post op Sinus rhythm with first-degree AV block PR interval 256 ms- Personally Reviewed  Physical Exam   GEN: Well nourished, well developed, in no acute distress  HEENT: normal BIPAP Neck: no JVD, carotid bruits, or masses Cardiac: irreg; no murmurs, rubs, or gallops,no edema  Respiratory:  clear to auscultation bilaterally, normal work of breathing GI: soft, nontender, nondistended, + BS MS: no deformity or atrophy  Skin: warm and dry, no rash Neuro:  Alert and Oriented x 3, Strength and sensation are intact Psych: euthymic mood, full affect   Labs    High Sensitivity Troponin:   Recent Labs  Lab 09/05/20 2141 09/06/20 0043  TROPONINIHS 17 20*      Chemistry Recent Labs  Lab 09/12/20 1517 09/13/20 0419 09/14/20 0441  NA 137 138 138  K 5.0  4.6 4.1  CL 106 107 107  CO2 22 22 25   GLUCOSE 180* 196* 116*  BUN 23 25* 32*  CREATININE 1.51* 1.33* 1.17  CALCIUM 8.7* 9.2 8.7*  GFRNONAA 48* 56* >60  ANIONGAP 9 9 6      Hematology Recent Labs  Lab 09/12/20 1517 09/13/20 0419 09/14/20 0441  WBC 14.5* 15.5* 10.1  RBC 3.52* 3.71* 3.23*  HGB 8.0* 8.3* 7.2*  HCT 26.6* 27.7* 24.4*  MCV 75.6* 74.7* 75.5*  MCH 22.7* 22.4* 22.3*  MCHC 30.1 30.0 29.5*  RDW 17.9* 18.0* 18.1*  PLT 125* 120* 127*    BNP No results for input(s): BNP, PROBNP in the last 168 hours.   DDimer No results for input(s): DDIMER in the last 168 hours.   Radiology    DG Chest Port 1 View  Result Date: 09/13/2020 CLINICAL DATA:  Open-heart surgery. EXAM: PORTABLE CHEST 1 VIEW COMPARISON:  09/2020. FINDINGS: Interim removal of Swan-Ganz catheter, mediastinal drainage catheter, and left chest tube. Right IJ sheath in stable  position. Prior cardiac valve replacement. Left atrial appendage clip in stable position. Stable cardiomegaly. Mild left base atelectasis and small left pleural effusion again noted. No pneumothorax. IMPRESSION: 1. Interim removal of Swan-Ganz catheter, mediastinal drainage catheter, and left chest tube. Right IJ sheath in stable position. No pneumothorax. 2.  Prior cardiac valve replacement.  Stable cardiomegaly. 3. Low lung volumes with persistent left base atelectasis. Small left pleural effusion cannot be excluded. No interim change. Electronically Signed   By: 11/13/2020  Register   On: 09/13/2020 06:28    Cardiac Studies   ECHO this admit   1. Left ventricular ejection fraction, by estimation, is 45 to 50%. The  left ventricle has mildly decreased function. The left ventricle  demonstrates global hypokinesis. Left ventricular diastolic parameters are  indeterminate.  2. Right ventricular systolic function is normal. The right ventricular  size is normal. Tricuspid regurgitation signal is inadequate for assessing  PA pressure.  3. Left atrial size was moderately dilated.  4. Right atrial size was mildly dilated.  5. The mitral valve is normal in structure. Trivial mitral valve  regurgitation. No evidence of mitral stenosis. Moderate mitral annular  calcification.  6. The aortic valve is tricuspid. Aortic valve regurgitation is not  visualized. Severe aortic valve stenosis. Aortic valve area, by VTI  measures 0.67 cm. Aortic valve mean gradient measures 35.0 mmHg.  7. Aortic dilatation noted. 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  8. The inferior vena cava is normal in size with <50% respiratory  variability, suggesting right atrial pressure of 8 mmHg.   Patient Profile     75 y.o. male post aortic valve replacement, CABG LIMA to LAD SVG to posterior descending  Assessment & Plan    Aortic valve replacement -25 mm Edwards INSPIRIS RESILIA  pericardial valve   Left atrial appendage clipping --Continue Eliquis on DC  Coronary artery disease -Two-vessel bypass LIMA to LAD and SVG to PDA --Progressing  Persistent atrial fibrillation -Was sinus bradycardia with first-degree AV block on 3/8.  Lets be careful with the carvedilol 12.5.  Prior to his surgery, we did need to pull back to 3.125 because of bradycardia. Continue with 3.125 for now. Now in AFIB with good rate control.  No changes made today.  Chronic diastolic heart failure -Wedge pressure was 31 at cath. -Overall doing quite well currently.  Continue to monitor.  AKI  - losartan held, off of dopamine.  Seems  to be doing well.  Improved.  Postop anemia -7.2.  Agree with unit of blood, iron.  Plan is to transfer to 4 E. later today if stable.  For questions or updates, please contact [facility] Please consult [email or web address] for contact info under        Signed, Donato Schultz, MD  09/14/2020, 10:15 AM

## 2020-09-14 NOTE — Progress Notes (Signed)
TCTS Evening Rounds POD 3 s/p AVR/CABG No complaints  BP 110/62   Pulse 69   Temp 97.9 F (36.6 C) (Oral)   Resp 17   Ht 5\' 10"  (1.778 m)   Wt 119.4 kg   SpO2 96%   BMI 37.77 kg/m  afib Alert/oriented CTA   Intake/Output Summary (Last 24 hours) at 09/14/2020 1701 Last data filed at 09/14/2020 1400 Gross per 24 hour  Intake 1054.64 ml  Output 405 ml  Net 649.64 ml    Making steady improvement txfused earlier Diuresis Mason Z. 11/14/2020, MD [phone]

## 2020-09-14 NOTE — Progress Notes (Signed)
3 Days Post-Op Procedure(s) (LRB): CORONARY ARTERY BYPASS GRAFTING (CABG), ON PUMP, TIMES 2 , USING LEFT INTERNAL MAMMARY ARTERY AND ENDOSCOPICALLY HARVESTED RIGHT GREATER SAPHENOUS VEIN (N/A) AORTIC VALVE REPLACEMENT (AVR) (N/A) CLIPPING OF ATRIAL APPENDAGE (N/A) TRANSESOPHAGEAL ECHOCARDIOGRAM (TEE) (N/A) Subjective: No complaints. Slept well. Ambulated this am. Had BM  Objective: Vital signs in last 24 hours: Temp:  [97.9 F (36.6 C)-98.5 F (36.9 C)] 97.9 F (36.6 C) (03/10 0400) Pulse Rate:  [67-95] 81 (03/10 0700) Cardiac Rhythm: Atrial fibrillation (03/10 0015) Resp:  [7-24] 17 (03/10 0700) BP: (96-133)/(52-83) 109/61 (03/10 0700) SpO2:  [88 %-97 %] 96 % (03/10 0700) Weight:  [119.4 kg] 119.4 kg (03/10 0500)  Hemodynamic parameters for last 24 hours:    Intake/Output from previous day: 03/09 0701 - 03/10 0700 In: 240 [P.O.:240] Out: 627 [Urine:627] Intake/Output this shift: No intake/output data recorded.  General appearance: alert and cooperative Neurologic: intact Heart: irregularly irregular rhythm Lungs: clear to auscultation bilaterally Extremities: edema mild Wound: dressings dry  Lab Results: Recent Labs    09/13/20 0419 09/14/20 0441  WBC 15.5* 10.1  HGB 8.3* 7.2*  HCT 27.7* 24.4*  PLT 120* 127*   BMET:  Recent Labs    09/13/20 0419 09/14/20 0441  NA 138 138  K 4.6 4.1  CL 107 107  CO2 22 25  GLUCOSE 196* 116*  BUN 25* 32*  CREATININE 1.33* 1.17  CALCIUM 9.2 8.7*    PT/INR:  Recent Labs    09/11/20 1459  LABPROT 18.1*  INR 1.6*   ABG    Component Value Date/Time   PHART 7.438 09/11/2020 2138   HCO3 23.1 09/11/2020 2138   TCO2 24 09/11/2020 2138   ACIDBASEDEF 1.0 09/11/2020 2138   O2SAT 96.0 09/11/2020 2138   CBG (last 3)  Recent Labs    09/13/20 2008 09/13/20 2130 09/14/20 0623  GLUCAP 150* 125* 121*    Assessment/Plan: S/P Procedure(s) (LRB): CORONARY ARTERY BYPASS GRAFTING (CABG), ON PUMP, TIMES 2 , USING LEFT  INTERNAL MAMMARY ARTERY AND ENDOSCOPICALLY HARVESTED RIGHT GREATER SAPHENOUS VEIN (N/A) AORTIC VALVE REPLACEMENT (AVR) (N/A) CLIPPING OF ATRIAL APPENDAGE (N/A) TRANSESOPHAGEAL ECHOCARDIOGRAM (TEE) (N/A)  POD 3  Hemodynamically stable but BP lower than his normal hypertensive self. Will DC Coreg for now until BP normalizes.  Acute kidney injury resolving with creat down to 1.17. DC dopamine.  Preop anemia. Hgb down to 7.2 from 8.3 yesterday. With borderline BP I think it would be best to give him a unit of PRBC's and continue iron.  Volume excess: Wt still 6 lbs over preop. Will give a dose of lasix and metolazone after blood this am.  Glucose control good on Levemir and SSI.  Continue IS, ambulation. Plan transfer to 4E later if BP stable off dopamine.   LOS: 8 days    Alleen Borne 09/14/2020

## 2020-09-15 LAB — BASIC METABOLIC PANEL
Anion gap: 9 (ref 5–15)
BUN: 31 mg/dL — ABNORMAL HIGH (ref 8–23)
CO2: 24 mmol/L (ref 22–32)
Calcium: 8.9 mg/dL (ref 8.9–10.3)
Chloride: 104 mmol/L (ref 98–111)
Creatinine, Ser: 1.05 mg/dL (ref 0.61–1.24)
GFR, Estimated: >60 mL/min (ref >60)
Glucose, Bld: 123 mg/dL — ABNORMAL HIGH (ref 70–99)
Potassium: 4.1 mmol/L (ref 3.5–5.1)
Sodium: 137 mmol/L (ref 135–145)

## 2020-09-15 LAB — BPAM RBC
Blood Product Expiration Date: 202203252359
ISSUE DATE / TIME: 202203101015
Unit Type and Rh: 6200

## 2020-09-15 LAB — TYPE AND SCREEN
ABO/RH(D): A POS
Antibody Screen: NEGATIVE
Unit division: 0

## 2020-09-15 LAB — GLUCOSE, CAPILLARY
Glucose-Capillary: 144 mg/dL — ABNORMAL HIGH (ref 70–99)
Glucose-Capillary: 106 mg/dL — ABNORMAL HIGH (ref 70–99)
Glucose-Capillary: 198 mg/dL — ABNORMAL HIGH (ref 70–99)
Glucose-Capillary: 126 mg/dL — ABNORMAL HIGH (ref 70–99)

## 2020-09-15 MED ORDER — ACETAMINOPHEN 325 MG PO TABS: 650.0000 mg | ORAL_TABLET | ORAL | Status: DC | PRN

## 2020-09-15 MED ORDER — CARVEDILOL 3.125 MG PO TABS
3.1250 mg | ORAL_TABLET | Freq: Two times a day (BID) | ORAL | Status: DC
  Administered 2020-09-15 – 2020-09-16 (×3): 3.125 mg via ORAL
  Filled 2020-09-15 (×3): qty 1

## 2020-09-15 MED ORDER — METOLAZONE 2.5 MG PO TABS
2.5000 mg | ORAL_TABLET | Freq: Once | ORAL | Status: AC
  Administered 2020-09-15: 2.5 mg via ORAL
  Filled 2020-09-15: qty 1

## 2020-09-15 MED ORDER — POTASSIUM CHLORIDE CRYS ER 20 MEQ PO TBCR
20.0000 meq | EXTENDED_RELEASE_TABLET | Freq: Every day | ORAL | Status: DC
  Administered 2020-09-15 – 2020-09-17 (×2): 20 meq via ORAL
  Filled 2020-09-15 (×2): qty 1

## 2020-09-15 MED FILL — Heparin Sodium (Porcine) Inj 1000 Unit/ML: INTRAMUSCULAR | Qty: 30 | Status: AC

## 2020-09-15 MED FILL — Potassium Chloride Inj 2 mEq/ML: INTRAVENOUS | Qty: 40 | Status: AC

## 2020-09-15 MED FILL — Magnesium Sulfate Inj 50%: INTRAMUSCULAR | Qty: 10 | Status: AC

## 2020-09-15 NOTE — Progress Notes (Signed)
CARDIAC REHAB PHASE I   PRE:  Rate/Rhythm: 69 afib  BP:  Supine: 110/66  Sitting:   Standing:    SaO2: 100%RA  MODE:  Ambulation: 370 ft   POST:  Rate/Rhythm: 92 afib  BP:  Supine:   Sitting: 153/67  Standing:    SaO2: 100%RA 1347-1425 Pt walked 370 ft on RA with gait belt use, EVA and asst x 1. Tolerated well. To recliner with call bell after walk. Pt demonstrated 1250-1500 ml on IS. Coughing frequently. Encouraged him to use pillow when coughing.  Second walk today.    Luetta Nutting, RN BSN  09/15/2020 2:22 PM

## 2020-09-15 NOTE — Progress Notes (Addendum)
Progress Note  Patient Name: Mason Patton Date of Encounter: 09/15/2020  [facility] Cardiologist: [name], MD   Subjective   Continue to ambulate.  No shortness of breath  Inpatient Medications    Scheduled Meds: . sodium chloride   Intravenous Once  . acetaminophen  1,000 mg Oral Q6H   Or  . acetaminophen (TYLENOL) oral liquid 160 mg/5 mL  1,000 mg Per Tube Q6H  . aspirin EC  325 mg Oral Daily   Or  . aspirin  324 mg Per Tube Daily  . atorvastatin  40 mg Oral QHS  . carvedilol  3.125 mg Oral BID WC  . Chlorhexidine Gluconate Cloth  6 each Topical Daily  . docusate sodium  200 mg Oral Daily  . enoxaparin (LOVENOX) injection  40 mg Subcutaneous QHS  . ferrous fumarate-b12-vitamic C-folic acid  1 capsule Oral BID PC  . furosemide  40 mg Oral Daily  . insulin aspart  0-24 Units Subcutaneous TID AC & HS  . insulin detemir  25 Units Subcutaneous Daily  . levothyroxine  224 mcg Oral QAC breakfast  . mouth rinse  15 mL Mouth Rinse BID  . pantoprazole  40 mg Oral Daily  . potassium chloride  20 mEq Oral Daily  . sodium chloride flush  10-40 mL Intracatheter Q12H  . sodium chloride flush  3 mL Intravenous Q12H   Continuous Infusions: . sodium chloride    . sodium chloride    . sodium chloride    . sodium chloride    . lactated ringers    . lactated ringers Stopped (09/12/20 0520)   PRN Meds: sodium chloride, sodium chloride, sodium chloride, acetaminophen, dextrose, levalbuterol, morphine injection, ondansetron (ZOFRAN) IV, sodium chloride flush, sodium chloride flush, traMADol   Vital Signs    Vitals:   09/15/20 0600 09/15/20 0700 09/15/20 0751 09/15/20 0800  BP: (!) 136/99 140/62  140/75  Pulse:  83  88  Resp: 15 19  (!) 24  Temp:   98.2 F (36.8 C)   TempSrc:   Oral   SpO2: 98% 100%  97%  Weight:      Height:        Intake/Output Summary (Last 24 hours) at 09/15/2020 0915 Last data filed at 09/15/2020 0800 Gross per 24 hour  Intake  887.64 ml  Output 2000 ml  Net -1112.36 ml   Last 3 Weights 09/15/2020 09/14/2020 09/12/2020  Weight (lbs) 261 lb 3.9 oz 263 lb 3.7 oz 264 lb 8.8 oz  Weight (kg) 118.5 kg 119.4 kg 120 kg      Telemetry    Atrial fibrillation rate controlled- Personally Reviewed  ECG    09/12/20 - post op Sinus rhythm with first-degree AV block PR interval 256 ms- Personally Reviewed  Physical Exam   GEN: Well nourished, well developed, in no acute distress  HEENT: normal  Neck: no JVD, carotid bruits, or masses Cardiac: RRR; no murmurs, rubs, or gallops, mild lower extremity edema, CABG scar Respiratory:  clear to auscultation bilaterally, normal work of breathing GI: soft, nontender, nondistended, + BS MS: no deformity or atrophy  Skin: warm and dry, no rash Neuro:  Alert and Oriented x 3, Strength and sensation are intact Psych: euthymic mood, full affect   Labs    High Sensitivity Troponin:   Recent Labs  Lab 09/05/20 2141 09/06/20 0043  TROPONINIHS 17 20*      Chemistry Recent Labs  Lab 09/12/20 1517 09/13/20 0419 09/14/20 0441  NA 137 138 138  K 5.0 4.6 4.1  CL 106 107 107  CO2 22 22 25   GLUCOSE 180* 196* 116*  BUN 23 25* 32*  CREATININE 1.51* 1.33* 1.17  CALCIUM 8.7* 9.2 8.7*  GFRNONAA 48* 56* >60  ANIONGAP 9 9 6      Hematology Recent Labs  Lab 09/12/20 1517 09/13/20 0419 09/14/20 0441  WBC 14.5* 15.5* 10.1  RBC 3.52* 3.71* 3.23*  HGB 8.0* 8.3* 7.2*  HCT 26.6* 27.7* 24.4*  MCV 75.6* 74.7* 75.5*  MCH 22.7* 22.4* 22.3*  MCHC 30.1 30.0 29.5*  RDW 17.9* 18.0* 18.1*  PLT 125* 120* 127*    BNP No results for input(s): BNP, PROBNP in the last 168 hours.   DDimer No results for input(s): DDIMER in the last 168 hours.   Radiology    No results found.  Cardiac Studies   ECHO this admit   1. Left ventricular ejection fraction, by estimation, is 45 to 50%. The  left ventricle has mildly decreased function. The left ventricle  demonstrates global  hypokinesis. Left ventricular diastolic parameters are  indeterminate.  2. Right ventricular systolic function is normal. The right ventricular  size is normal. Tricuspid regurgitation signal is inadequate for assessing  PA pressure.  3. Left atrial size was moderately dilated.  4. Right atrial size was mildly dilated.  5. The mitral valve is normal in structure. Trivial mitral valve  regurgitation. No evidence of mitral stenosis. Moderate mitral annular  calcification.  6. The aortic valve is tricuspid. Aortic valve regurgitation is not  visualized. Severe aortic valve stenosis. Aortic valve area, by VTI  measures 0.67 cm. Aortic valve mean gradient measures 35.0 mmHg.  7. Aortic dilatation noted. 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  8. The inferior vena cava is normal in size with <50% respiratory  variability, suggesting right atrial pressure of 8 mmHg.   Patient Profile     75 y.o. male post aortic valve replacement, CABG LIMA to LAD SVG to posterior descending  Assessment & Plan    Aortic valve replacement -25 mm Edwards INSPIRIS RESILIA pericardial valve   Left atrial appendage clipping --Continue Eliquis on DC  Coronary artery disease -Two-vessel bypass LIMA to LAD and SVG to PDA -High intensity statin atorvastatin 40 mg, aspirin --Progressing well. To4E  Persistent atrial fibrillation -Was sinus bradycardia with first-degree AV block on 3/8.   Prior to his surgery, we did need to pull back COREG to 3.125 because of bradycardia. Agree with restart Coreg low dose. AFIB with good rate control.  No changes made today.  Chronic diastolic heart failure -Wedge pressure was 31 at cath. -Overall doing quite well currently.  Continue to monitor.  AKI  - losartan held.  May be able to resume tomorrow if blood pressure allows.  Seems to be doing well.  Improved.Lasix 40 PO.   Postop anemia -7.2.  1 unit of blood 3/10, iron.  Plan is to  transfer to 4 E. later today if stable.  For questions or updates, please contact [facility] Please consult [email or web address] for contact info under        Signed, 66, MD  09/15/2020, 9:15 AM

## 2020-09-15 NOTE — Progress Notes (Signed)
Progress Note   Patient Name: Mason Patton Date of Encounter: 09/16/2020  [facility] Cardiologist: [name], MD   Subjective   Patient is feeling very well. Asking to go home today.   Inpatient Medications    Scheduled Meds: . sodium chloride   Intravenous Once  . acetaminophen  1,000 mg Oral Q6H   Or  . acetaminophen (TYLENOL) oral liquid 160 mg/5 mL  1,000 mg Per Tube Q6H  . apixaban  5 mg Oral BID  . aspirin EC  81 mg Oral Daily  . atorvastatin  40 mg Oral QHS  . carvedilol  6.25 mg Oral BID WC  . Chlorhexidine Gluconate Cloth  6 each Topical Daily  . docusate sodium  200 mg Oral Daily  . ferrous fumarate-b12-vitamic C-folic acid  1 capsule Oral BID PC  . furosemide  40 mg Oral Daily  . insulin aspart  0-24 Units Subcutaneous TID AC & HS  . insulin detemir  25 Units Subcutaneous Daily  . levothyroxine  224 mcg Oral QAC breakfast  . mouth rinse  15 mL Mouth Rinse BID  . pantoprazole  40 mg Oral Daily  . potassium chloride  20 mEq Oral Daily  . sodium chloride flush  10-40 mL Intracatheter Q12H  . sodium chloride flush  3 mL Intravenous Q12H   Continuous Infusions: . sodium chloride    . sodium chloride    . sodium chloride    . sodium chloride    . lactated ringers    . lactated ringers Stopped (09/12/20 0520)   PRN Meds: sodium chloride, sodium chloride, sodium chloride, acetaminophen, dextrose, levalbuterol, morphine injection, ondansetron (ZOFRAN) IV, sodium chloride flush, sodium chloride flush, traMADol   Vital Signs    Vitals:   09/16/20 0700 09/16/20 0800 09/16/20 0830 09/16/20 0940  BP: (!) 146/63 137/78 (!) 153/83 (!) 148/70  Pulse: 67 71 88   Resp: 11 (!) 28 (!) 24 (!) 26  Temp: (!) 97.1 F (36.2 C)     TempSrc: Axillary     SpO2: 100% 98% 100%   Weight:      Height:        Intake/Output Summary (Last 24 hours) at 09/16/2020 1002 Last data filed at 09/16/2020 0830 Gross per 24 hour  Intake -  Output 2350 ml  Net -2350  ml   Last 3 Weights 09/16/2020 09/15/2020 09/14/2020  Weight (lbs) 259 lb 14.8 oz 261 lb 3.9 oz 263 lb 3.7 oz  Weight (kg) 117.9 kg 118.5 kg 119.4 kg      Telemetry    Afib with HR 70-80s - Personally Reviewed  ECG    No new tracing - Personally Reviewed  Physical Exam   GEN: No acute distress.   Neck: No JVD Cardiac: RRR, no murmurs, rubs, or gallops. Healing sternotomy site Respiratory: Clear to auscultation bilaterally. GI: Soft, nontender, non-distended  MS: 1+ LE edema to mid-shin Neuro:  Nonfocal  Psych: Normal affect   Labs    High Sensitivity Troponin:   Recent Labs  Lab 09/05/20 2141 09/06/20 0043  TROPONINIHS 17 20*      Chemistry Recent Labs  Lab 09/14/20 0441 09/15/20 1002 09/16/20 0021  NA 138 137 137  K 4.1 4.1 3.6  CL 107 104 106  CO2 25 24 23   GLUCOSE 116* 123* 104*  BUN 32* 31* 30*  CREATININE 1.17 1.05 0.92  CALCIUM 8.7* 8.9 8.7*  GFRNONAA >60 >60 >60  ANIONGAP 6 9 8  Hematology Recent Labs  Lab 09/13/20 0419 09/14/20 0441 09/16/20 0021  WBC 15.5* 10.1 7.9  RBC 3.71* 3.23* 3.69*  HGB 8.3* 7.2* 8.5*  HCT 27.7* 24.4* 27.7*  MCV 74.7* 75.5* 75.1*  MCH 22.4* 22.3* 23.0*  MCHC 30.0 29.5* 30.7  RDW 18.0* 18.1* 20.0*  PLT 120* 127* 149*    BNPNo results for input(s): BNP, PROBNP in the last 168 hours.   DDimer No results for input(s): DDIMER in the last 168 hours.   Radiology    DG Chest 2 View  Result Date: 09/16/2020 CLINICAL DATA:  Pleural effusion. EXAM: CHEST - 2 VIEW COMPARISON:  Chest x-ray 09/13/2020 FINDINGS: Removal of a right internal jugular central venous Cordis. The heart size and mediastinal contours are unchanged. Aortic arch calcifications. Atrial appendage clip. Aortic arch replacement. Suggestion of pericardial calcifications on the lateral view. Biapical pleural/pulmonary scarring. No focal consolidation. No pulmonary edema. Persistent trace small volume left pleural effusion. No pneumothorax. No acute  osseous abnormality. IMPRESSION: Persistent trace-small volume left pleural effusion. Electronically Signed   By: Tish Frederickson M.D.   On: 09/16/2020 05:34    Cardiac Studies   TTE 09/06/20: 1. Left ventricular ejection fraction, by estimation, is 45 to 50%. The  left ventricle has mildly decreased function. The left ventricle  demonstrates global hypokinesis. Left ventricular diastolic parameters are  indeterminate.  2. Right ventricular systolic function is normal. The right ventricular  size is normal. Tricuspid regurgitation signal is inadequate for assessing  PA pressure.  3. Left atrial size was moderately dilated.  4. Right atrial size was mildly dilated.  5. The mitral valve is normal in structure. Trivial mitral valve  regurgitation. No evidence of mitral stenosis. Moderate mitral annular  calcification.  6. The aortic valve is tricuspid. Aortic valve regurgitation is not  visualized. Severe aortic valve stenosis. Aortic valve area, by VTI  measures 0.67 cm. Aortic valve mean gradient measures 35.0 mmHg.  7. Aortic dilatation noted. 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  8. The inferior vena cava is normal in size with <50% respiratory  variability, suggesting right atrial pressure of 8 mmHg.   Patient Profile     75 y.o. male with history of persistent atrial fibrillation, CAS, tobacco abuse, hypertension, obesity, obstructive sleep apnea, prior CVA, DMII and HLD who presented with chest pain found to have multivessel CAD on cath and severe AS now s/p CABG with LIMA to LAD SVG to posterior descending artery and AVR with 25 mm [employer] INSPIRIS RESILIA pericardial valve.  Assessment & Plan    #Severe Aortic Stenosis s/p Aortic valve replacement: Patient found to have severe AS on cath that was being performed for progressive angina and chest pain. TTE with Aortic valve area, by VTI 0.67 cm. Aortic valve mean gradient measures 35.0 mmHg now  s/p AVR with 25 mm [employer] INSPIRIS RESILIA pericardial valve. -Doing well post-operatively -Management per CV surgery  #Multivessel CAD s/p CABG: Patient presented with progressive angina found to have multivessel CAD and severe AS on coronary angiography as detailed above. Now s/p two-vessel bypass LIMA to LAD and SVG to PDA. --Continue ASA 81mg  daily -Continue atorvastatin 40 mg --Continue coreg 6.25mg  BID --Progressing well. To4E  #Persistent atrial fibrillation s/p Left atrial appendage clipping Rate controlled. -Continue coreg and apixaban for [facility]  #Chronic diastolic heart failure -Continue lasix 40mg  PO daily -Monitor I/Os and daily weights -Low Na diet  #AKI Resolved. -Continue lasix 40mg  daily -Hold ARB per  surgery--resume as out-patient  #Postop anemia -8.5.  1 unit of blood 3/10, iron.     For questions or updates, please contact [facility] Please consult [email or web address] for contact info under        Signed, [name], MD  09/16/2020, 10:02 AM

## 2020-09-15 NOTE — Progress Notes (Signed)
Patient ID: Mason Patton, male   DOB: 1945/10/12, 75 y.o.   MRN: [id number] [facility] Evening Rounds:  Hemodynamically stable in chronic atrial fib.  Diuresing today.  Creat back to normal.  Awaiting bed on 4E.

## 2020-09-15 NOTE — Progress Notes (Addendum)
[facility] DAILY ICU PROGRESS NOTE                   [address]            [place] [address]          [phone]   4 Days Post-Op Procedure(s) (LRB): CORONARY ARTERY BYPASS GRAFTING (CABG), ON PUMP, TIMES 2 , USING LEFT INTERNAL MAMMARY ARTERY AND ENDOSCOPICALLY HARVESTED RIGHT GREATER SAPHENOUS VEIN (N/A) AORTIC VALVE REPLACEMENT (AVR) (N/A) CLIPPING OF ATRIAL APPENDAGE (N/A) TRANSESOPHAGEAL ECHOCARDIOGRAM (TEE) (N/A)  Total Length of Stay:  LOS: 9 days   Subjective: Patient sitting in chair. He has no specific complaint this am.  Objective: Vital signs in last 24 hours: Temp:  [97.9 F (36.6 C)-98 F (36.7 C)] 97.9 F (36.6 C) (03/11 0400) Pulse Rate:  [63-85] 83 (03/11 0700) Cardiac Rhythm: Atrial fibrillation (03/10 2000) Resp:  [11-24] 19 (03/11 0700) BP: (88-140)/(49-99) 140/62 (03/11 0700) SpO2:  [95 %-100 %] 100 % (03/11 0700) Weight:  [118.5 kg] 118.5 kg (03/11 0500)  Filed Weights   09/12/20 0500 09/14/20 0500 09/15/20 0500  Weight: 120 kg 119.4 kg 118.5 kg    Weight change: -0.9 kg      Intake/Output from previous day: 03/10 0701 - 03/11 0700 In: 1007.6 [P.O.:540; I.V.:134.3; Blood:333.3] Out: 1900 [Urine:1900]  Intake/Output this shift: No intake/output data recorded.  Current Meds: Scheduled Meds: . sodium chloride   Intravenous Once  . acetaminophen  1,000 mg Oral Q6H   Or  . acetaminophen (TYLENOL) oral liquid 160 mg/5 mL  1,000 mg Per Tube Q6H  . aspirin EC  325 mg Oral Daily   Or  . aspirin  324 mg Per Tube Daily  . atorvastatin  40 mg Oral QHS  . Chlorhexidine Gluconate Cloth  6 each Topical Daily  . docusate sodium  200 mg Oral Daily  . enoxaparin (LOVENOX) injection  40 mg Subcutaneous QHS  . ferrous fumarate-b12-vitamic C-folic acid  1 capsule Oral BID PC  . furosemide  40 mg Oral Daily  . insulin aspart  0-24 Units Subcutaneous TID AC & HS  . insulin detemir  25 Units Subcutaneous Daily  . levothyroxine  224 mcg  Oral QAC breakfast  . mouth rinse  15 mL Mouth Rinse BID  . pantoprazole  40 mg Oral Daily  . sodium chloride flush  10-40 mL Intracatheter Q12H  . sodium chloride flush  3 mL Intravenous Q12H   Continuous Infusions: . sodium chloride    . sodium chloride    . sodium chloride    . sodium chloride    . lactated ringers    . lactated ringers Stopped (09/12/20 0520)   PRN Meds:.sodium chloride, sodium chloride, sodium chloride, acetaminophen, dextrose, levalbuterol, morphine injection, ondansetron (ZOFRAN) IV, sodium chloride flush, sodium chloride flush, traMADol  General appearance: alert, cooperative and no distress Heart: IRRR, no murmur Lungs: Slightly diminished bibasilar breath sounds Abdomen: Soft, non tender, bowel sounds present Extremities: Mild LE edema Wound: Sternal dressing removed and wound is clean and dry. RLE wound is clean and dry.  Lab Results: CBC: Recent Labs    09/13/20 0419 09/14/20 0441  WBC 15.5* 10.1  HGB 8.3* 7.2*  HCT 27.7* 24.4*  PLT 120* 127*   BMET:  Recent Labs    09/13/20 0419 09/14/20 0441  NA 138 138  K 4.6 4.1  CL 107 107  CO2 22 25  GLUCOSE 196* 116*  BUN 25* 32*  CREATININE  1.33* 1.17  CALCIUM 9.2 8.7*    CMET: Lab Results  Component Value Date   WBC 10.1 09/14/2020   HGB 7.2 (L) 09/14/2020   HCT 24.4 (L) 09/14/2020   PLT 127 (L) 09/14/2020   GLUCOSE 116 (H) 09/14/2020   CHOL 106 04/12/2016   TRIG 109.0 04/12/2016   HDL 27.50 (L) 04/12/2016   LDLCALC 57 04/12/2016   ALT 21 09/05/2020   AST 23 09/05/2020   NA 138 09/14/2020   K 4.1 09/14/2020   CL 107 09/14/2020   CREATININE 1.17 09/14/2020   BUN 32 (H) 09/14/2020   CO2 25 09/14/2020   TSH 1.317 09/06/2020   INR 1.6 (H) 09/11/2020   HGBA1C 7.4 (H) 09/06/2020   MICROALBUR 17.7 (H) 02/03/2017      PT/INR: No results for input(s): LABPROT, INR in the last 72 hours. Radiology: No results found.   Assessment/Plan: S/P Procedure(s) (LRB): CORONARY ARTERY  BYPASS GRAFTING (CABG), ON PUMP, TIMES 2 , USING LEFT INTERNAL MAMMARY ARTERY AND ENDOSCOPICALLY HARVESTED RIGHT GREATER SAPHENOUS VEIN (N/A) AORTIC VALVE REPLACEMENT (AVR) (N/A) CLIPPING OF ATRIAL APPENDAGE (N/A) TRANSESOPHAGEAL ECHOCARDIOGRAM (TEE) (N/A) 1. CV-History of a fib. In a fib with CVR. Will restart low dose Coreg as no longer hypotensive. Will decrease ec asa to 81 and restart Apixaban at discharge. 2. Pulmonary-On room air this am. Check PA/LAT CXR in am. Encourage incentive spirometer. 3. Volume overload-will give low dose Metolazone and Lasix this am 4. Expected post op blood loss anemia-Had transfusion yesterday. Continue oral Trinsicon. Check CBC in am 5. DM-CBGs 144/148/114. On InsulinPre op HGA1C 7.4. Will restart Glipizide and Metformin closer/at discharge. 6. History of hypothryoidism-continue Levothyroxine 224 mcg daily 7. Remove EPW 8. Transfer to 4E  Donielle Margaretann Loveless PA-C 09/15/2020 7:27 AM    Chart reviewed, patient examined, agree with above. He looks good and feels well.  Creatinine back to baseline. Continue diuresis.

## 2020-09-16 ENCOUNTER — Inpatient Hospital Stay (HOSPITAL_COMMUNITY): Payer: No Typology Code available for payment source

## 2020-09-16 LAB — BASIC METABOLIC PANEL
Anion gap: 8 (ref 5–15)
BUN: 30 mg/dL — ABNORMAL HIGH (ref 8–23)
CO2: 23 mmol/L (ref 22–32)
Calcium: 8.7 mg/dL — ABNORMAL LOW (ref 8.9–10.3)
Chloride: 106 mmol/L (ref 98–111)
Creatinine, Ser: 0.92 mg/dL (ref 0.61–1.24)
GFR, Estimated: >60 mL/min (ref >60)
Glucose, Bld: 104 mg/dL — ABNORMAL HIGH (ref 70–99)
Potassium: 3.6 mmol/L (ref 3.5–5.1)
Sodium: 137 mmol/L (ref 135–145)

## 2020-09-16 LAB — CBC
HCT: 27.7 % — ABNORMAL LOW (ref 39.0–52.0)
Hemoglobin: 8.5 g/dL — ABNORMAL LOW (ref 13.0–17.0)
MCH: 23 pg — ABNORMAL LOW (ref 26.0–34.0)
MCHC: 30.7 g/dL (ref 30.0–36.0)
MCV: 75.1 fL — ABNORMAL LOW (ref 80.0–100.0)
Platelets: 149 10*3/uL — ABNORMAL LOW (ref 150–400)
RBC: 3.69 MIL/uL — ABNORMAL LOW (ref 4.22–5.81)
RDW: 20 % — ABNORMAL HIGH (ref 11.5–15.5)
WBC: 7.9 10*3/uL (ref 4.0–10.5)
nRBC: 0 % (ref 0.0–0.2)

## 2020-09-16 LAB — GLUCOSE, CAPILLARY
Glucose-Capillary: 117 mg/dL — ABNORMAL HIGH (ref 70–99)
Glucose-Capillary: 127 mg/dL — ABNORMAL HIGH (ref 70–99)
Glucose-Capillary: 159 mg/dL — ABNORMAL HIGH (ref 70–99)
Glucose-Capillary: 89 mg/dL (ref 70–99)

## 2020-09-16 MED ORDER — APIXABAN 5 MG PO TABS
5.0000 mg | ORAL_TABLET | Freq: Two times a day (BID) | ORAL | Status: DC
  Administered 2020-09-16 – 2020-09-17 (×3): 5 mg via ORAL
  Filled 2020-09-16 (×3): qty 1

## 2020-09-16 MED ORDER — POTASSIUM CHLORIDE CRYS ER 20 MEQ PO TBCR
20.0000 meq | EXTENDED_RELEASE_TABLET | ORAL | Status: AC
  Administered 2020-09-16 (×3): 20 meq via ORAL
  Filled 2020-09-16 (×3): qty 1

## 2020-09-16 MED ORDER — ASPIRIN EC 81 MG PO TBEC
81.0000 mg | DELAYED_RELEASE_TABLET | Freq: Every day | ORAL | Status: DC
  Administered 2020-09-17: 81 mg via ORAL
  Filled 2020-09-16: qty 1

## 2020-09-16 MED ORDER — CARVEDILOL 6.25 MG PO TABS
6.2500 mg | ORAL_TABLET | Freq: Two times a day (BID) | ORAL | Status: DC
  Administered 2020-09-16: 6.25 mg via ORAL
  Filled 2020-09-16: qty 1

## 2020-09-16 NOTE — Progress Notes (Signed)
Pt admitted to [facility] from [facility].  Pt is A&O X4 and neuro intact.  Pt vitals taken and all within normal range with exception of BP 141/70 (87).  Pt has chronic A-fib.    Pt placed on telemetry and CCMD notified.  CHG bath completed.  Pt is currently comfortable and not in pain.

## 2020-09-16 NOTE — Plan of Care (Signed)
  Problem: Education: Goal: Ability to demonstrate management of disease process will improve Outcome: Progressing Goal: Ability to verbalize understanding of medication therapies will improve Outcome: Progressing Goal: Individualized Educational Video(s) Outcome: Progressing   Problem: Activity: Goal: Capacity to carry out activities will improve Outcome: Progressing   Problem: Cardiac: Goal: Ability to achieve and maintain adequate cardiopulmonary perfusion will improve Outcome: Progressing   Problem: Education: Goal: Understanding of CV disease, CV risk reduction, and recovery process will improve Outcome: Progressing Goal: Individualized Educational Video(s) Outcome: Progressing   Problem: Activity: Goal: Ability to return to baseline activity level will improve Outcome: Progressing   Problem: Cardiovascular: Goal: Ability to achieve and maintain adequate cardiovascular perfusion will improve Outcome: Progressing   Problem: Health Behavior/Discharge Planning: Goal: Ability to safely manage health-related needs after discharge will improve Outcome: Progressing   Problem: Education: Goal: Knowledge of General Education information will improve Description: Including pain rating scale, medication(s)/side effects and non-pharmacologic comfort measures Outcome: Progressing   Problem: Health Behavior/Discharge Planning: Goal: Ability to manage health-related needs will improve Outcome: Progressing   Problem: Clinical Measurements: Goal: Ability to maintain clinical measurements within normal limits will improve Outcome: Progressing Goal: Will remain free from infection Outcome: Progressing Goal: Diagnostic test results will improve Outcome: Progressing Goal: Respiratory complications will improve Outcome: Progressing Goal: Cardiovascular complication will be avoided Outcome: Progressing   Problem: Activity: Goal: Risk for activity intolerance will  decrease Outcome: Progressing   Problem: Nutrition: Goal: Adequate nutrition will be maintained Outcome: Progressing   Problem: Coping: Goal: Level of anxiety will decrease Outcome: Progressing   Problem: Elimination: Goal: Will not experience complications related to bowel motility Outcome: Progressing Goal: Will not experience complications related to urinary retention Outcome: Progressing   Problem: Pain Managment: Goal: General experience of comfort will improve Outcome: Progressing   Problem: Safety: Goal: Ability to remain free from injury will improve Outcome: Progressing   Problem: Skin Integrity: Goal: Risk for impaired skin integrity will decrease Outcome: Progressing   Problem: Education: Goal: Will demonstrate proper wound care and an understanding of methods to prevent future damage Outcome: Progressing Goal: Knowledge of disease or condition will improve Outcome: Progressing Goal: Knowledge of the prescribed therapeutic regimen will improve Outcome: Progressing Goal: Individualized Educational Video(s) Outcome: Progressing   Problem: Activity: Goal: Risk for activity intolerance will decrease Outcome: Progressing   Problem: Cardiac: Goal: Will achieve and/or maintain hemodynamic stability Outcome: Progressing   Problem: Clinical Measurements: Goal: Postoperative complications will be avoided or minimized Outcome: Progressing   Problem: Respiratory: Goal: Respiratory status will improve Outcome: Progressing   Problem: Skin Integrity: Goal: Wound healing without signs and symptoms of infection Outcome: Progressing Goal: Risk for impaired skin integrity will decrease Outcome: Progressing   Problem: Urinary Elimination: Goal: Ability to achieve and maintain adequate renal perfusion and functioning will improve Outcome: Progressing

## 2020-09-16 NOTE — Plan of Care (Signed)
  Problem: Education: Goal: Ability to demonstrate management of disease process will improve Outcome: Progressing   Problem: Activity: Goal: Capacity to carry out activities will improve Outcome: Progressing   Problem: Cardiac: Goal: Ability to achieve and maintain adequate cardiopulmonary perfusion will improve Outcome: Progressing   Problem: Activity: Goal: Ability to return to baseline activity level will improve Outcome: Progressing   Problem: Cardiovascular: Goal: Ability to achieve and maintain adequate cardiovascular perfusion will improve Outcome: Progressing Goal: Vascular access site(s) Level 0-1 will be maintained Outcome: Completed/Met

## 2020-09-16 NOTE — Progress Notes (Signed)
Patient has walked with nursing staff already this morning, reviewed home exercise progression guidelines with patient, no further questions.  Referred to phase II cardiac rehab in [place]. [id number]

## 2020-09-16 NOTE — Progress Notes (Signed)
5 Days Post-Op Procedure(s) (LRB): CORONARY ARTERY BYPASS GRAFTING (CABG), ON PUMP, TIMES 2 , USING LEFT INTERNAL MAMMARY ARTERY AND ENDOSCOPICALLY HARVESTED RIGHT GREATER SAPHENOUS VEIN (N/A) AORTIC VALVE REPLACEMENT (AVR) (N/A) CLIPPING OF ATRIAL APPENDAGE (N/A) TRANSESOPHAGEAL ECHOCARDIOGRAM (TEE) (N/A) Subjective: No complaints. Slept well. Feels like he is ready to go home.  Objective: Vital signs in last 24 hours: Temp:  [97.1 F (36.2 C)-98.3 F (36.8 C)] 97.1 F (36.2 C) (03/12 0700) Pulse Rate:  [67-88] 88 (03/12 0830) Cardiac Rhythm: Atrial fibrillation (03/12 0400) Resp:  [11-28] 26 (03/12 0940) BP: (97-163)/(60-124) 148/70 (03/12 0940) SpO2:  [98 %-100 %] 100 % (03/12 0830) Weight:  [117.9 kg] 117.9 kg (03/12 0400)  Hemodynamic parameters for last 24 hours:    Intake/Output from previous day: 03/11 0701 - 03/12 0700 In: -  Out: 2450 [Urine:2450] Intake/Output this shift: Total I/O In: -  Out: 150 [Urine:150]  General appearance: alert and cooperative Neurologic: intact Heart: irregularly irregular rhythm Lungs: clear to auscultation bilaterally Extremities: edema mild Wound: incision ok  Lab Results: Recent Labs    09/14/20 0441 09/16/20 0021  WBC 10.1 7.9  HGB 7.2* 8.5*  HCT 24.4* 27.7*  PLT 127* 149*   BMET:  Recent Labs    09/15/20 1002 09/16/20 0021  NA 137 137  K 4.1 3.6  CL 104 106  CO2 24 23  GLUCOSE 123* 104*  BUN 31* 30*  CREATININE 1.05 0.92  CALCIUM 8.9 8.7*    PT/INR: No results for input(s): LABPROT, INR in the last 72 hours. ABG    Component Value Date/Time   PHART 7.438 09/11/2020 2138   HCO3 23.1 09/11/2020 2138   TCO2 24 09/11/2020 2138   ACIDBASEDEF 1.0 09/11/2020 2138   O2SAT 96.0 09/11/2020 2138   CBG (last 3)  Recent Labs    09/15/20 1526 09/15/20 2150 09/16/20 0646  GLUCAP 198* 126* 117*    Assessment/Plan: S/P Procedure(s) (LRB): CORONARY ARTERY BYPASS GRAFTING (CABG), ON PUMP, TIMES 2 , USING LEFT  INTERNAL MAMMARY ARTERY AND ENDOSCOPICALLY HARVESTED RIGHT GREATER SAPHENOUS VEIN (N/A) AORTIC VALVE REPLACEMENT (AVR) (N/A) CLIPPING OF ATRIAL APPENDAGE (N/A) TRANSESOPHAGEAL ECHOCARDIOGRAM (TEE) (N/A)  POD 5 Hemodynamically stable in chronic rate-controlled atrial fib. Will increase Coreg to 6.25 bid today and may increase to 12.5 bid at discharge if BP remains 140's. Hold off on Cozaar. It can be resumed later as outpt. Preop EF 40-45.  Chronic AF: resume Eliquis today with ASA 81 mg.   Volume excess: wt is continuing to decrease. 3 lbs over preop. Will send home on lasix 40 daily.  DM: glucose under adequate control. Resume oral agents at discharge.  Chronic anemia: continue iron. He was supposed to have colonoscopy prior to his admission.  Awaiting bed on 4E. Plan home in am with his wife.   LOS: 10 days    Alleen Borne 09/16/2020

## 2020-09-16 NOTE — Progress Notes (Signed)
Pt was already on him home unit cpap and resting comfortably at this time.

## 2020-09-17 LAB — GLUCOSE, CAPILLARY
Glucose-Capillary: 80 mg/dL (ref 70–99)
Glucose-Capillary: 81 mg/dL (ref 70–99)
Glucose-Capillary: 122 mg/dL — ABNORMAL HIGH (ref 70–99)

## 2020-09-17 MED ORDER — CARVEDILOL 12.5 MG PO TABS: 12.5000 mg | ORAL_TABLET | Freq: Two times a day (BID) | ORAL | 1 refills | Status: DC

## 2020-09-17 MED ORDER — TRAMADOL HCL 50 MG PO TABS: 50.0000 mg | ORAL_TABLET | ORAL | 0 refills | Status: DC | PRN

## 2020-09-17 MED ORDER — CARVEDILOL 12.5 MG PO TABS: 12.5000 mg | ORAL_TABLET | Freq: Two times a day (BID) | ORAL | Status: DC

## 2020-09-17 MED ORDER — FUROSEMIDE 40 MG PO TABS: 40.0000 mg | ORAL_TABLET | Freq: Every day | ORAL | 0 refills | Status: DC

## 2020-09-17 MED ORDER — ASPIRIN 81 MG PO TBEC
81.0000 mg | DELAYED_RELEASE_TABLET | Freq: Every day | ORAL | 11 refills | Status: AC
Start: 2020-09-18 — End: ?

## 2020-09-17 MED ORDER — POTASSIUM CHLORIDE CRYS ER 20 MEQ PO TBCR: 20.0000 meq | EXTENDED_RELEASE_TABLET | Freq: Every day | ORAL | 0 refills | Status: DC

## 2020-09-17 NOTE — Progress Notes (Signed)
Pt discharged to home with family.  Pt's IV's removed.  Pt taken off telemetry and CCMD notified.  Pt left with all of their personal belongings. AVS documentation reviewed and sent home with Pt and wife and all questions were answered.

## 2020-09-17 NOTE — Progress Notes (Addendum)
      [address]       [place] [address]             [phone]      6 Days Post-Op Procedure(s) (LRB): CORONARY ARTERY BYPASS GRAFTING (CABG), ON PUMP, TIMES 2 , USING LEFT INTERNAL MAMMARY ARTERY AND ENDOSCOPICALLY HARVESTED RIGHT GREATER SAPHENOUS VEIN (N/A) AORTIC VALVE REPLACEMENT (AVR) (N/A) CLIPPING OF ATRIAL APPENDAGE (N/A) TRANSESOPHAGEAL ECHOCARDIOGRAM (TEE) (N/A) Subjective: Feels good this morning. Wife had lots of questions which were all answered.   Objective: Vital signs in last 24 hours: Temp:  [97.6 F (36.4 C)-98.6 F (37 C)] 98.6 F (37 C) (03/13 0806) Pulse Rate:  [68-96] 75 (03/13 0806) Cardiac Rhythm: Atrial fibrillation (03/13 0800) Resp:  [15-26] 18 (03/13 0806) BP: (127-152)/(58-88) 149/88 (03/13 0806) SpO2:  [98 %-100 %] 98 % (03/13 0806) Weight:  [115.2 kg] 115.2 kg (03/13 0614)     Intake/Output from previous day: 03/12 0701 - 03/13 0700 In: 380 [P.O.:380] Out: 1900 [Urine:1900] Intake/Output this shift: No intake/output data recorded.  General appearance: alert, cooperative and no distress Heart: irregularly irregular rhythm Lungs: clear to auscultation bilaterally Abdomen: soft, non-tender; bowel sounds normal; no masses,  no organomegaly Extremities: extremities normal, atraumatic, no cyanosis or edema Wound: clean and dry  Lab Results: Recent Labs    09/16/20 0021  WBC 7.9  HGB 8.5*  HCT 27.7*  PLT 149*   BMET:  Recent Labs    09/15/20 1002 09/16/20 0021  NA 137 137  K 4.1 3.6  CL 104 106  CO2 24 23  GLUCOSE 123* 104*  BUN 31* 30*  CREATININE 1.05 0.92  CALCIUM 8.9 8.7*    PT/INR: No results for input(s): LABPROT, INR in the last 72 hours. ABG    Component Value Date/Time   PHART 7.438 09/11/2020 2138   HCO3 23.1 09/11/2020 2138   TCO2 24 09/11/2020 2138   ACIDBASEDEF 1.0 09/11/2020 2138   O2SAT 96.0 09/11/2020 2138   CBG (last 3)  Recent Labs    09/16/20 2219 09/17/20 0616  09/17/20 0711  GLUCAP 89 80 81    Assessment/Plan: S/P Procedure(s) (LRB): CORONARY ARTERY BYPASS GRAFTING (CABG), ON PUMP, TIMES 2 , USING LEFT INTERNAL MAMMARY ARTERY AND ENDOSCOPICALLY HARVESTED RIGHT GREATER SAPHENOUS VEIN (N/A) AORTIC VALVE REPLACEMENT (AVR) (N/A) CLIPPING OF ATRIAL APPENDAGE (N/A) TRANSESOPHAGEAL ECHOCARDIOGRAM (TEE) (N/A)  1. CV-Hemodynamically stable in chronic rate-controlled atrial fib. Will increase Coreg to 12.5 BID.  Hold off on Cozaar. It can be resumed later as outpt. Preop EF 40-45 2. Chronic AF-Continue Eliquis and ASA81 3. Renal-creatinine 0.92, electrolytes okay 4. H and H 8.5/27.7, expected acute blood loss anemia 5. Endo-blood glucose well controlled, will restart oral agents on discharge.   Plan: Discharge today, instructions reviewed and questions answered.     LOS: 11 days    Sharlene Dory 09/17/2020   Chart reviewed, patient examined, agree with above. He looks good and wants to go home. Will continue lasix daily for now.  Eliquis and ASA 81 for AF. Home today.

## 2020-09-18 ENCOUNTER — Other Ambulatory Visit: Payer: Self-pay | Admitting: Physician Assistant

## 2020-09-20 ENCOUNTER — Telehealth (HOSPITAL_COMMUNITY): Payer: Self-pay

## 2020-09-20 NOTE — Telephone Encounter (Signed)
Called and spoke with pt in regards to CR, pt stated he is interested and will like to use his [place]. Adv pt he will need to contact the [place] for auth, pt verbalized understanding. Will contact patient for scheduling once f/u has been completed.

## 2020-09-20 NOTE — Telephone Encounter (Signed)
Pt wife called back and stated that she spoke with the [place] and they stated that its already a auth in the pt chart and for [place] to use that one. Printed the pt [place] auth out. The pt [place] auth is valid from 08/21/2020-02/17/2021. The auth is [id number]. Spoke with pt wife and advised her that his [place] Berkley Harvey was printed and that we would contact him once his f/u visit has been completed. Pt wife understood.

## 2020-09-26 ENCOUNTER — Other Ambulatory Visit: Payer: Self-pay

## 2020-09-26 ENCOUNTER — Ambulatory Visit (INDEPENDENT_AMBULATORY_CARE_PROVIDER_SITE_OTHER): Payer: Self-pay | Admitting: *Deleted

## 2020-09-26 ENCOUNTER — Ambulatory Visit: Payer: No Typology Code available for payment source | Admitting: Cardiology

## 2020-09-26 DIAGNOSIS — Z4802 Encounter for removal of sutures: Secondary | ICD-10-CM

## 2020-09-26 NOTE — Progress Notes (Signed)
Patient arrived for nurse visit to remove sutures post-CABG 3/7 by Dr. Laneta Simmers.  Three sutures removed with no signs or symptoms of infection noted.  Incisions well approximated.  Patient tolerated suture removal well.  Patient and family instructed to keep the incision site clean and dry. Patient and family acknowledged instructions given.  All questions answered.

## 2020-09-29 NOTE — Progress Notes (Signed)
Cardiology Office Note:    Date:  10/02/2020   ID:  Mason Patton, DOB 05-Apr-1946, MRN [id number]  PCP:  Merwyn Katos   [place]  Cardiologist:  Freada Bergeron, MD  Advanced Practice Provider:  No care team member to display Electrophysiologist:  None   Referring MD: Heywood Bene, *   History of Present Illness:    Mason Patton is a 75 y.o. male with a hx of persistent atrial fibrillation, CAS,tobacco abuse, hypertension, obesity, obstructive sleep apnea,prior CVA, DMII, HLD and newly diagnosed multivessel CAD on cath and severe AS now s/p CABG with LIMA to LAD and SVG to posterior descending artery and AVR with 25 mm Edwards INSPIRIS RESILIA pericardial valve. Now presenting to clinic for follow-up.  Patient last seen in clinic on 09/05/20 where he was having worsening DOE and exertional chest pain. Stress test at the [facility] revealed multiple defects concerning for ischemia in the RCA and LAD territory. He had several recent episodes of chest pain and was noted to be in bi/trigeminy in the office. Trop was elevated and patient was subsequently referred to the ER for further evaluation.   He was admitted to [facility] from 09/05/20-09/17/20. He underwent coronary angiography which revealed severe two-vessel coronary disease with diffusely calcified coronaries. The proximal LAD had an 80% hazy stenosis. The RCA had 80% proximal and 80% mid stenoses. TTE als showed severe AS with AVA 0.67, mean gradient 78mHg. He underwent CABG x 2, LA clip, and bioprosthetic AVR on 09/11/2020. Post-op, he had mild AKI which improved with a dopamine gtt. He also required diuresis with lasix and metolazone. He now presents to clinic for follow-up.   Patient states that he feels more tired and fatigued over the past week. Has developed worsening LE edema and DOE. Wt went from nadir of 247-->254lbs. Admits to eating fast food frequently, but otherwise has been  compliant with all medications. Currently on lasix 433mdaily. Blood pressure elevated at 140 today. Had one episode of dark stool that has since resolved. No other signs of bleeding. Currently on iron and B12 for anemia.  Past Medical History:  Diagnosis Date  . Aortic stenosis    09/30/19 echo ([facility], Cardiologist Dr. LiGeraldo Pitter Mild-moderate AS, MaxVel 308 cm/s, MeanPG 22 mmHg, AVA 1.6 cm2  . Arthritis   . [place]rial fibrillation ([facility]  . Carotid artery stenosis    [facility] [facility] 50-69% RICA stenosis  . Cholecystitis with cholelithiasis   . Eczema   . Emphysema of lung ([facility]  . Gallstones and inflammation of gallbladder without obstruction   . Heart murmur   . History of nuclear stress test 02/23/2010   dipyridamole; normal pattern of perfusion in all regions; normal, low risk study   . Hyperlipidemia   . Hypertension   . 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 Meningoencephalitis   . Obesity   . OSA on CPAP    last sleep study >8 yrs  . Pneumonia    "twice" (05/19/2013)  . Shortness of breath   . [facility]   "they said I had a minor one when I had menigitis" (05/19/2013)  . Thyroid disease   . Tobacco abuse    quit 11/08/2012  . Type II diabetes mellitus ([facility]  . Umbilical hernia     Past Surgical History:  Procedure Laterality Date  . AORTIC VALVE REPLACEMENT N/A 09/11/2020   Procedure: AORTIC  VALVE REPLACEMENT (AVR);  Surgeon: Gaye Pollack, MD;  Location: [place];  Service: Open Heart Surgery;  Laterality: N/A;  . CHOLECYSTECTOMY  09/09/2011   Procedure: LAPAROSCOPIC CHOLECYSTECTOMY WITH INTRAOPERATIVE CHOLANGIOGRAM;  Surgeon: Belva Crome, MD;  Location: [place];  Service: General;  Laterality: N/A;  . CLIPPING OF ATRIAL APPENDAGE N/A 09/11/2020   Procedure: CLIPPING OF ATRIAL APPENDAGE;  Surgeon: Gaye Pollack, MD;  Location: [address];  Service: Open Heart Surgery;  Laterality: N/A;  . CORONARY ARTERY BYPASS GRAFT  N/A 09/11/2020   Procedure: CORONARY ARTERY BYPASS GRAFTING (CABG), ON PUMP, TIMES 2 , USING LEFT INTERNAL MAMMARY ARTERY AND ENDOSCOPICALLY HARVESTED RIGHT GREATER SAPHENOUS VEIN;  Surgeon: Gaye Pollack, MD;  Location: [place];  Service: Open Heart Surgery;  Laterality: N/A;  . HERNIA REPAIR  04/2006   UHR  . JOINT REPLACEMENT    . KNEE ARTHROSCOPY Right    "w2 times" (05/19/2013)  . RIGHT/LEFT HEART CATH AND CORONARY ANGIOGRAPHY N/A 09/07/2020   Procedure: RIGHT/LEFT HEART CATH AND CORONARY ANGIOGRAPHY;  Surgeon: Lorretta Harp, MD;  Location: [place] CV LAB;  Service: Cardiovascular;  Laterality: N/A;  . TEE WITHOUT CARDIOVERSION N/A 09/11/2020   Procedure: TRANSESOPHAGEAL ECHOCARDIOGRAM (TEE);  Surgeon: Gaye Pollack, MD;  Location: [place];  Service: Open Heart Surgery;  Laterality: N/A;  . TONSILLECTOMY    . TOTAL KNEE ARTHROPLASTY Left 05/19/2013  . TOTAL KNEE ARTHROPLASTY Left 05/19/2013   Procedure: TOTAL KNEE ARTHROPLASTY;  Surgeon: Newt Minion, MD;  Location: [place];  Service: Orthopedics;  Laterality: Left;  Left Total Knee Arthroplasty  . TOTAL KNEE ARTHROPLASTY Right 11/24/2019  . TOTAL KNEE ARTHROPLASTY Right 11/24/2019   Procedure: RIGHT TOTAL KNEE ARTHROPLASTY;  Surgeon: Newt Minion, MD;  Location: [place];  Service: Orthopedics;  Laterality: Right;  . TRANSTHORACIC ECHOCARDIOGRAM  03/28/2005   EF=>55%, mod conc LVH; LA mod dilated & borderline RA enlargement; mild TR; mild pulm valve regurg    Current Medications: Current Meds  Medication Sig  . acetaminophen (TYLENOL) 325 MG tablet Take 2 tablets (650 mg total) by mouth every 4 (four) hours as needed for headache or mild pain.  [name] apixaban (ELIQUIS) 5 MG TABS tablet Take 5 mg by mouth 2 (two) times daily.  [name] aspirin EC 81 MG EC tablet Take 1 tablet (81 mg total) by mouth daily. Swallow whole.  [name] atorvastatin (LIPITOR) 40 MG tablet Take 40 mg by mouth at bedtime. Reported on 01/11/2016  . Blood Glucose Monitoring Suppl  (ONETOUCH VERIO FLEX SYSTEM) W/DEVICE KIT 1 each by Does not apply route daily. Dx: E11.9  . carvedilol (COREG) 12.5 MG tablet Take 1 tablet (12.5 mg total) by mouth 2 (two) times daily with a meal.  . cholecalciferol (VITAMIN D3) 25 MCG (1000 UNIT) tablet Take 1,000 Units by mouth daily.  . ferrous sulfate 325 (65 FE) MG tablet Take 1 tablet by mouth 3 (three) times a week.  . furosemide (LASIX) 20 MG tablet Take 3 tablets (60 mg total) by mouth in the AM, then take 2 tabs (40 mg total) by mouth in the PM for 3 days only, then go back to taking 2 tabs (40 mg total) by mouth daily thereafter.  [name] glipiZIDE (GLUCOTROL) 10 MG tablet Take 10 mg by mouth 2 (two) times daily before a meal.  . glucose blood (ONETOUCH VERIO) test strip Use to test blood sugar 2-3 times daily as instructed. Dx: E11.9  . levothyroxine (SYNTHROID) 112 MCG  tablet Take 224 mcg by mouth daily before breakfast.  . losartan (COZAAR) 50 MG tablet Take 1 tablet (50 mg total) by mouth daily.  . metFORMIN (GLUCOPHAGE-XR) 500 MG 24 hr tablet TAKE 4 TABLETS BY MOUTH  DAILY WITH DINNER (Patient taking differently: Take 2,000 mg by mouth 2 (two) times daily.)  . Multiple Vitamins-Minerals (MULTIVITAMIN WITH MINERALS) tablet Take 1 tablet by mouth daily.  . Multiple Vitamins-Minerals (PRESERVISION AREDS 2+MULTI VIT PO) Take 1 capsule by mouth in the morning and at bedtime.  [name] omeprazole (PRILOSEC) 20 MG capsule Take 1 capsule by mouth daily.  [name] DELICA LANCETS FINE MISC Use to test blood sugar 2-3 times daily as instructed. Dx: E11.9  . potassium chloride SA (KLOR-CON) 20 MEQ tablet Take 1 tablet (20 mEq total) by mouth 2 (two) times daily.  . traMADol (ULTRAM) 50 MG tablet Take 1 tablet (50 mg total) by mouth every 4 (four) hours as needed for moderate pain.  . VENTOLIN HFA 108 (90 Base) MCG/ACT inhaler Inhale 1-2 puffs into the lungs every 4 (four) hours as needed for wheezing or shortness of breath.   . vitamin B-12  (CYANOCOBALAMIN) 500 MCG tablet Take 1 tablet by mouth 2 (two) times daily.  . [DISCONTINUED] furosemide (LASIX) 40 MG tablet Take 1 tablet (40 mg total) by mouth daily.  . [DISCONTINUED] potassium chloride SA (KLOR-CON) 20 MEQ tablet Take 1 tablet (20 mEq total) by mouth daily.     Allergies:   Codeine and Demerol   Social History   Socioeconomic History  . Marital status: Married    Spouse name: sandra  . Number of children: 3  . Years of education: tech  . Highest education level: Not on file  Occupational History    Employer: [employer]  Tobacco Use  . Smoking status: Current Every Day Smoker    Packs/day: 0.50    Years: 50.00    Pack years: 25.00    Types: Cigarettes  . Smokeless tobacco: Never Used  Vaping Use  . Vaping Use: Never used  Substance and Sexual Activity  . Alcohol use: No  . Drug use: No  . Sexual activity: Yes  Other Topics Concern  . Not on file  Social History Narrative  . Not on file   Social Determinants of Health   Financial Resource Strain: Not on file  Food Insecurity: Not on file  Transportation Needs: Not on file  Physical Activity: Not on file  Stress: Not on file  Social Connections: Not on file     Family History: The patient's family history includes Asthma in his child; Cancer in his maternal aunt and maternal aunt; Diabetes in his maternal grandmother; Diabetes type I in his child; Hyperlipidemia in his father; Thyroid disease in his mother.  ROS:   Please see the history of present illness.    Review of Systems  Constitutional: Positive for malaise/fatigue. Negative for chills and fever.  HENT: Negative for sore throat.   Eyes: Negative for blurred vision and redness.  Respiratory: Positive for cough, sputum production and shortness of breath.   Cardiovascular: Positive for leg swelling. Negative for chest pain, palpitations, orthopnea, claudication and PND.  Gastrointestinal: Negative for blood in stool, melena, nausea and  vomiting.  Genitourinary: Negative for hematuria.  Musculoskeletal: Positive for myalgias. Negative for falls.  Skin: Negative for rash.  Neurological: Negative for dizziness and loss of consciousness.  Endo/Heme/Allergies: Negative for polydipsia.  Psychiatric/Behavioral: Negative for substance abuse.    EKGs/Labs/Other  Studies Reviewed:    The following studies were reviewed today: TTE 10-02-2020: 1. Left ventricular ejection fraction, by estimation, is 45 to 50%. The  left ventricle has mildly decreased function. The left ventricle  demonstrates global hypokinesis. Left ventricular diastolic parameters are  indeterminate.  2. Right ventricular systolic function is normal. The right ventricular  size is normal. Tricuspid regurgitation signal is inadequate for assessing  PA pressure.  3. Left atrial size was moderately dilated.  4. Right atrial size was mildly dilated.  5. The mitral valve is normal in structure. Trivial mitral valve  regurgitation. No evidence of mitral stenosis. Moderate mitral annular  calcification.  6. The aortic valve is tricuspid. Aortic valve regurgitation is not  visualized. Severe aortic valve stenosis. Aortic valve area, by VTI  measures 0.67 cm. Aortic valve mean gradient measures 35.0 mmHg.  7. Aortic dilatation noted. 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  8. The inferior vena cava is normal in size with <50% respiratory  variability, suggesting right atrial pressure of 8 mmHg.   Cath 10-02-20:   Prox RCA lesion is 80% stenosed.  Mid RCA lesion is 80% stenosed.  Prox LAD lesion is 80% stenosed.  Hemodynamic findings consistent with aortic valve stenosis.   CABG Op Note 09/11/20: Procedure:  1. Median Sternotomy 2. Extracorporeal circulation 3.   Coronary artery bypass grafting x 2   Left internal mammary artery graft to the LAD  SVG to PD   4.   Endoscopic vein harvest from the right leg 5.    Aortic valve replacement using a 25 mm Edwards INSPIRIS RESILIA pericardial valve (model 11500A, SN [id number])  Recent Labs: 09/05/2020: ALT 21; NT-Pro BNP 3,817 10/02/20: B Natriuretic Peptide 803.0; TSH 1.317 09/12/2020: Magnesium 2.9 09/16/2020: BUN 30; Creatinine, Ser 0.92; Hemoglobin 8.5; Platelets 149; Potassium 3.6; Sodium 137  Recent Lipid Panel    Component Value Date/Time   CHOL 106 04/12/2016 1423   TRIG 109.0 04/12/2016 1423   HDL 27.50 (L) 04/12/2016 1423   CHOLHDL 4 04/12/2016 1423   VLDL 21.8 04/12/2016 1423   LDLCALC 57 04/12/2016 1423     Risk Assessment/Calculations:    CHA2DS2-VASc Score = 7  This indicates a 11.2% annual risk of stroke. The patient's score is based upon: CHF History: Yes HTN History: Yes Diabetes History: Yes Stroke History: Yes Vascular Disease History: Yes Age Score: 1 Gender Score: 0     Physical Exam:    VS:  BP 140/64   Pulse 74   Ht '5\' 10"'  (1.778 m)   Wt 256 lb (116.1 kg)   SpO2 98%   BMI 36.73 kg/m     Wt Readings from Last 3 Encounters:  10/02/20 256 lb (116.1 kg)  09/17/20 253 lb 14.4 oz (115.2 kg)  09/05/20 260 lb 6.4 oz (118.1 kg)     GEN:  Comfortable, NAD HEENT: Normal NECK: No JVD; No carotid bruits CARDIAC: Irregular, soft systolic murmur best heard at RUSB, no gallops RESPIRATORY: Diminished but clear  ABDOMEN: Soft, non-tender, non-distended MUSCULOSKELETAL:  R>L 1+ pitting edema SKIN: Warm and dry NEUROLOGIC:  Alert and oriented x 3 PSYCHIATRIC:  Normal affect   ASSESSMENT:    1. Aortic valve stenosis, etiology of cardiac valve disease unspecified   2. Coronary artery disease involving native coronary artery of native heart with angina pectoris ([place])   3. Medication management   4. Mixed hyperlipidemia   5. S/P CABG (coronary artery bypass graft)  6. Acute on chronic combined systolic and diastolic congestive heart failure ([place])   7. Primary hypertension   8. Longstanding persistent atrial  fibrillation ([place])   9. Iron deficiency anemia, unspecified iron deficiency anemia type   10. Type 2 diabetes mellitus with other circulatory complication, with long-term current use of insulin ([place])   11. Cerebrovascular accident (CVA), unspecified mechanism ([place])    PLAN:    In order of problems listed above:  #Severe Aortic Stenosis s/p Aortic valve replacement: Patient found to have severe AS on cath that was being performed for progressive angina and chest pain. TTE with Aortic valve area, by VTI 0.67 cm. Aortic valve mean gradient measures 35.0 mmHg now s/p AVR with 25 mm Edwards INSPIRIS RESILIA pericardial valve. Now with volume overload as detailed above.  -Management per CV surgery -Manage HFrEF as below  #Multivessel CAD s/p CABG: Patient presented with progressive angina found to have multivessel CAD and severe AS on coronary angiography as detailed above. Now s/p two-vessel bypass LIMA to LAD and SVG to PDA. No anginal symptoms but overloaded on examinatin --Continue ASA 28m daily -Continue atorvastatin 40 mg --Continue coreg 6.281mBID --Start losartan 5043maily --BMET next week  #Acute on chronic combined systolic and diastolic heart failure: TTE 09/06/20 with LVEF 45-50%. Now grossly overloaded on examination with NYHA class II-III symptoms. Likely triggered by dietary non-compliance as patient has been eating a lot of fast food. Compliant with all medications. No anginal symptoms.  -Increase lasix to 31m75m qAM and 40mg38m x 3days and then 40mg 33mthereafter -Check BMET, BNP today and next week on the changed dose of lasix -Continue coreg and losartan as detailed above -Monitor daily weights and call if gaining >3lbs in a couple of days -Spent a lot to time emphasizing the importance of a low Na diet and avoiding take out food -Will need close clinic follow-up  #Persistent atrial fibrillation s/p Left atrial appendage clipping Rate controlled. -Continue coreg  and apixaban for AC  #Postop anemia -Check CBC today -Continue iron and B12 supplementation  #History of CVA: -Continue aspirin, apixaban and statin as above   Medication Adjustments/Labs and Tests Ordered: Current medicines are reviewed at length with the patient today.  Concerns regarding medicines are outlined above.  Orders Placed This Encounter  Procedures  . Basic metabolic panel  . Pro b natriuretic peptide  . Basic metabolic panel  . AMB referral to cardiac rehabilitation   Meds ordered this encounter  Medications  . losartan (COZAAR) 50 MG tablet    Sig: Take 1 tablet (50 mg total) by mouth daily.    Dispense:  90 tablet    Refill:  2  . furosemide (LASIX) 20 MG tablet    Sig: Take 3 tablets (60 mg total) by mouth in the AM, then take 2 tabs (40 mg total) by mouth in the PM for 3 days only, then go back to taking 2 tabs (40 mg total) by mouth daily thereafter.    Dispense:  67 tablet    Refill:  0    Dose increase  . potassium chloride SA (KLOR-CON) 20 MEQ tablet    Sig: Take 1 tablet (20 mEq total) by mouth 2 (two) times daily.    Dispense:  180 tablet    Refill:  0    Dose increase    Patient Instructions  Medication Instructions:   START TAKING LOSARTAN 50 MG BY MOUTH DAILY  INCREASE YOUR LASIX TO TAKING 60  MG BY MOUTH IN THE AM THEN TAKE 40 MG BY MOUTH IN THE PM FOR 3 DAYS ONLY, THEN GO BACK TO TAKING LASIX 40 MG BY MOUTH DAILY THEREAFTER  INCREASE YOUR POTASSIUM CHLORIDE TO 20 mEq BY MOUTH TWICE DAILY  *If you need a refill on your cardiac medications before your next appointment, please call your pharmacy*   Lab Work:  TODAY--BMET AND PRO-BNP  AND IN ONE WEEK--CHECK ANOTHER BMET  If you have labs (blood work) drawn today and your tests are completely normal, you will receive your results only by: [name] MyChart Message (if you have MyChart) OR . A paper copy in the mail If you have any lab test that is abnormal or we need to change your treatment,  we will call you to review the results.    You have been referred to [place] S/P CABG    Follow-Up:  2 WEEKS IN THE OFFICE WITH DR. Johney Frame       Signed, Freada Bergeron, MD  10/02/2020 11:54 AM    [place]

## 2020-10-02 ENCOUNTER — Encounter: Payer: Self-pay | Admitting: Cardiology

## 2020-10-02 ENCOUNTER — Other Ambulatory Visit: Payer: Self-pay

## 2020-10-02 ENCOUNTER — Ambulatory Visit (INDEPENDENT_AMBULATORY_CARE_PROVIDER_SITE_OTHER): Payer: No Typology Code available for payment source | Admitting: Cardiology

## 2020-10-02 VITALS — BP 140/64 | HR 74 | Ht 70.0 in | Wt 256.0 lb

## 2020-10-02 DIAGNOSIS — D509 Iron deficiency anemia, unspecified: Secondary | ICD-10-CM

## 2020-10-02 DIAGNOSIS — I350 Nonrheumatic aortic (valve) stenosis: Secondary | ICD-10-CM | POA: Diagnosis not present

## 2020-10-02 DIAGNOSIS — E782 Mixed hyperlipidemia: Secondary | ICD-10-CM | POA: Diagnosis not present

## 2020-10-02 DIAGNOSIS — I639 Cerebral infarction, unspecified: Secondary | ICD-10-CM

## 2020-10-02 DIAGNOSIS — Z794 Long term (current) use of insulin: Secondary | ICD-10-CM

## 2020-10-02 DIAGNOSIS — I10 Essential (primary) hypertension: Secondary | ICD-10-CM

## 2020-10-02 DIAGNOSIS — I25119 Atherosclerotic heart disease of native coronary artery with unspecified angina pectoris: Secondary | ICD-10-CM | POA: Diagnosis not present

## 2020-10-02 DIAGNOSIS — Z79899 Other long term (current) drug therapy: Secondary | ICD-10-CM

## 2020-10-02 DIAGNOSIS — I4811 Longstanding persistent atrial fibrillation: Secondary | ICD-10-CM

## 2020-10-02 DIAGNOSIS — I5043 Acute on chronic combined systolic (congestive) and diastolic (congestive) heart failure: Secondary | ICD-10-CM

## 2020-10-02 DIAGNOSIS — Z951 Presence of aortocoronary bypass graft: Secondary | ICD-10-CM

## 2020-10-02 DIAGNOSIS — E1159 Type 2 diabetes mellitus with other circulatory complications: Secondary | ICD-10-CM

## 2020-10-02 MED ORDER — FUROSEMIDE 20 MG PO TABS: ORAL_TABLET | ORAL | 0 refills | Status: DC

## 2020-10-02 MED ORDER — POTASSIUM CHLORIDE CRYS ER 20 MEQ PO TBCR: 20.0000 meq | EXTENDED_RELEASE_TABLET | Freq: Two times a day (BID) | ORAL | 0 refills | Status: DC

## 2020-10-02 MED ORDER — LOSARTAN POTASSIUM 50 MG PO TABS: 50.0000 mg | ORAL_TABLET | Freq: Every day | ORAL | 2 refills | Status: DC

## 2020-10-02 NOTE — Patient Instructions (Signed)
Medication Instructions:   START TAKING LOSARTAN 50 MG BY MOUTH DAILY  INCREASE YOUR LASIX TO TAKING 60 MG BY MOUTH IN THE AM THEN TAKE 40 MG BY MOUTH IN THE PM FOR 3 DAYS ONLY, THEN GO BACK TO TAKING LASIX 40 MG BY MOUTH DAILY THEREAFTER  INCREASE YOUR POTASSIUM CHLORIDE TO 20 mEq BY MOUTH TWICE DAILY  *If you need a refill on your cardiac medications before your next appointment, please call your pharmacy*   Lab Work:  TODAY--BMET AND PRO-BNP  AND IN ONE WEEK--CHECK ANOTHER BMET  If you have labs (blood work) drawn today and your tests are completely normal, you will receive your results only by: [name] MyChart Message (if you have MyChart) OR . A paper copy in the mail If you have any lab test that is abnormal or we need to change your treatment, we will call you to review the results.    You have been referred to CARDIAC REHAB AT [facility] FOR S/P CABG    Follow-Up:  2 WEEKS IN THE OFFICE WITH DR. Shari Prows

## 2020-10-03 LAB — BASIC METABOLIC PANEL
BUN/Creatinine Ratio: 23 (ref 10–24)
BUN: 19 mg/dL (ref 8–27)
CO2: 24 mmol/L (ref 20–29)
Calcium: 9.3 mg/dL (ref 8.6–10.2)
Chloride: 102 mmol/L (ref 96–106)
Creatinine, Ser: 0.82 mg/dL (ref 0.76–1.27)
Glucose: 62 mg/dL — ABNORMAL LOW (ref 65–99)
Potassium: 4.4 mmol/L (ref 3.5–5.2)
Sodium: 142 mmol/L (ref 134–144)
eGFR: 92 mL/min/{1.73_m2} (ref >59)

## 2020-10-03 LAB — PRO B NATRIURETIC PEPTIDE: NT-Pro BNP: 2384 pg/mL — ABNORMAL HIGH (ref 0–376)

## 2020-10-09 ENCOUNTER — Telehealth: Payer: Self-pay | Admitting: *Deleted

## 2020-10-09 ENCOUNTER — Other Ambulatory Visit: Payer: No Typology Code available for payment source | Admitting: *Deleted

## 2020-10-09 ENCOUNTER — Other Ambulatory Visit: Payer: Self-pay

## 2020-10-09 DIAGNOSIS — Z79899 Other long term (current) drug therapy: Secondary | ICD-10-CM

## 2020-10-09 DIAGNOSIS — I25119 Atherosclerotic heart disease of native coronary artery with unspecified angina pectoris: Secondary | ICD-10-CM

## 2020-10-09 DIAGNOSIS — I350 Nonrheumatic aortic (valve) stenosis: Secondary | ICD-10-CM

## 2020-10-09 DIAGNOSIS — E782 Mixed hyperlipidemia: Secondary | ICD-10-CM

## 2020-10-09 DIAGNOSIS — Z951 Presence of aortocoronary bypass graft: Secondary | ICD-10-CM

## 2020-10-09 DIAGNOSIS — I5043 Acute on chronic combined systolic (congestive) and diastolic (congestive) heart failure: Secondary | ICD-10-CM

## 2020-10-09 NOTE — Telephone Encounter (Signed)
   [place] Pre-operative Risk Assessment    Patient Name: Mason Patton  DOB: [birth year]  MRN: [id number]   HEARTCARE STAFF: - Please ensure there is not already an duplicate clearance open for this procedure. - Under Visit Info/Reason for Call, type in Other and utilize the format Clearance MM/DD/YY or Clearance TBD. Do not use dashes or single digits. - If request is for dental extraction, please clarify the # of teeth to be extracted.  Request for surgical clearance:  1. What type of surgery is being performed? PT HAVING PAIN UNDER A CROWN, MAY HAVE TO 1 TOOTH TO BE POSSIBLY EXTRACTED  2. When is this surgery scheduled? TBD   3. What type of clearance is required (medical clearance vs. Pharmacy clearance to hold med vs. Both)? BOTH  4. Are there any medications that need to be held prior to surgery and how long? ELIQUIS AND ASA    5. Practice name and name of physician performing surgery? [facility]; DR. Vonna Kotyk RAMEY   6. What is the office phone number? [phone]   7.   What is the office fax number? [phone]  8.   Anesthesia type (None, local, MAC, general) ? LOCAL    Julaine Hua 10/09/2020, 9:58 AM  _________________________________________________________________   (provider comments below)

## 2020-10-09 NOTE — Telephone Encounter (Addendum)
Hi Dr. Shari Prows,   Mr. Crehan recently had CABG x2 and AVR on 09/11/2020. You recently saw him on 10/02/2020 and he was volume overloaded. BNP was elevated and Lasix was increased. He is now having pain under a dental crown and may need to have the tooth extracted. I know dental procedures like this are incredibly low risk but would you recommend this wait until after he is seen for follow-up on 10/17/2020 (scheduled to see Jacolyn Reedy, PA).  Please route response back to P CV DIV PREOP.  Thanks so much! Feleica Fulmore

## 2020-10-10 ENCOUNTER — Telehealth: Payer: Self-pay | Admitting: *Deleted

## 2020-10-10 LAB — BASIC METABOLIC PANEL
BUN/Creatinine Ratio: 21 (ref 10–24)
BUN: 18 mg/dL (ref 8–27)
CO2: 22 mmol/L (ref 20–29)
Calcium: 9.5 mg/dL (ref 8.6–10.2)
Chloride: 99 mmol/L (ref 96–106)
Creatinine, Ser: 0.86 mg/dL (ref 0.76–1.27)
Glucose: 136 mg/dL — ABNORMAL HIGH (ref 65–99)
Potassium: 4.6 mmol/L (ref 3.5–5.2)
Sodium: 142 mmol/L (ref 134–144)
eGFR: 91 mL/min/{1.73_m2} (ref >59)

## 2020-10-10 NOTE — Telephone Encounter (Signed)
-----   Message from Cammy Copa, RN sent at 10/10/2020  2:38 PM EDT ----- Regarding: RE: cardiac rehab referral ordered at last OV on 3/28 by Remigio Eisenmenger   Will forward to [facility] our nurse navigator we have a wait list and delays due to current capacity.   ----- Message ----- From: [name], LPN Sent: [phone]/[id number]/[id number]   2:22 PM EDT To: Cammy Copa, RN, Pricilla Holm, # Subject: cardiac rehab referral ordered at last OV on#  This pt was referred to Cardiac Rehab when he saw Dr. Shari Prows last week on 10/02/20.  The pt and his wife have heard nothing about the referral and from Cardiac Rehab.  He needs this for S/P CABG.  Can you please help with this referral and getting him in there and let me know.  Call the pt and his wife.    Thanks, [employer]

## 2020-10-11 NOTE — Progress Notes (Signed)
Cardiology Office Note    Date:  10/17/2020   ID:  Mason Patton, DOB 12-28-1945, MRN [id number]   PCP:  Merwyn Katos   [place]  Cardiologist:  Freada Bergeron, MD  Advanced Practice Provider:  No care team member to display Electrophysiologist:  None   [phone]   No chief complaint on file.   History of Present Illness:  Mason Patton is a 75 y.o. male with a hx of persistent atrial fibrillation, CAD, tobacco abuse, hypertension, obesity, obstructive sleep apnea, prior CVA, DMII, HLD and newly diagnosed multivessel CAD on cath and severe AS now s/p CABG 09/11/2020 with LIMA to LAD and SVG to posterior descending artery and AVR with 25 mm Edwards INSPIRIS RESILIA pericardial valve.      Patient saw Dr. Johney Frame 10/02/2020 and weight went from 247 pounds up to 254 pounds he was eating fast foods.  Echo 09/06/2020 LVEF 45 to 50%.  He had significant fluid overload.  Lasix increased to 60 mg in the morning 40 in the evening for 3 days then 40 mg twice daily.  2 g sodium diet given.  Patient needs surgical clearance for dental extraction.  Dr. Johney Frame  did want to wait until patient's visit with you on 10/17/2020 before giving him the OK to proceed with dental procedure.   Also, it does not sound like Eliquis or Aspirin should need to be held given he is only having  1 possible tooth extraction. However, Dr. Johney Frame said if Eliquis did need to be held, patient would need Lovenox bridge given his is just over 1 month out from his surgery. Aspirin; however, should not be held.  Patient comes in still DOE. Has lost down to 244. Swelling is down. Still smoking but down to 1/2 ppd.  Past Medical History:  Diagnosis Date  . Aortic stenosis    09/30/19 echo ([facility], Cardiologist Dr. Geraldo Pitter): Mild-moderate AS, MaxVel 308 cm/s, MeanPG 22 mmHg, AVA 1.6 cm2  . Arthritis   . Atrial fibrillation ([place])   . Carotid artery stenosis     11/03/19 [place] ([facility]): 50-69% RICA stenosis  . Cholecystitis with cholelithiasis   . Eczema   . Emphysema of lung ([place])   . Gallstones and inflammation of gallbladder without obstruction   . Heart murmur   . History of nuclear stress test 02/23/2010   dipyridamole; normal pattern of perfusion in all regions; normal, low risk study   . Hyperlipidemia   . Hypertension   . 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 Meningoencephalitis   . Obesity   . OSA on CPAP    last sleep study >8 yrs  . Pneumonia    "twice" (05/19/2013)  . Shortness of breath   . Stroke [facility])    "they said I had a minor one when I had menigitis" (05/19/2013)  . Thyroid disease   . Tobacco abuse    quit 11/08/2012  . Type II diabetes mellitus ([place])   . Umbilical hernia     Past Surgical History:  Procedure Laterality Date  . AORTIC VALVE REPLACEMENT N/A 09/11/2020   Procedure: AORTIC VALVE REPLACEMENT (AVR);  Surgeon: Gaye Pollack, MD;  Location: [place];  Service: Open Heart Surgery;  Laterality: N/A;  . CHOLECYSTECTOMY  09/09/2011   Procedure: LAPAROSCOPIC CHOLECYSTECTOMY WITH INTRAOPERATIVE CHOLANGIOGRAM;  Surgeon: Belva Crome, MD;  Location: [place];  Service: General;  Laterality: N/A;  . CLIPPING  OF ATRIAL APPENDAGE N/A 09/11/2020   Procedure: CLIPPING OF ATRIAL APPENDAGE;  Surgeon: Gaye Pollack, MD;  Location: [place];  Service: Open Heart Surgery;  Laterality: N/A;  . CORONARY ARTERY BYPASS GRAFT N/A 09/11/2020   Procedure: CORONARY ARTERY BYPASS GRAFTING (CABG), ON PUMP, TIMES 2 , USING LEFT INTERNAL MAMMARY ARTERY AND ENDOSCOPICALLY HARVESTED RIGHT GREATER SAPHENOUS VEIN;  Surgeon: Gaye Pollack, MD;  Location: [place];  Service: Open Heart Surgery;  Laterality: N/A;  . HERNIA REPAIR  04/2006   UHR  . JOINT REPLACEMENT    . KNEE ARTHROSCOPY Right    "w2 times" (05/19/2013)  . RIGHT/LEFT HEART CATH AND CORONARY ANGIOGRAPHY N/A 09/07/2020   Procedure: RIGHT/LEFT HEART  CATH AND CORONARY ANGIOGRAPHY;  Surgeon: Lorretta Harp, MD;  Location: [place] [facility];  Service: Cardiovascular;  Laterality: N/A;  . TEE WITHOUT CARDIOVERSION N/A 09/11/2020   Procedure: TRANSESOPHAGEAL ECHOCARDIOGRAM (TEE);  Surgeon: Gaye Pollack, MD;  Location: [place];  Service: Open Heart Surgery;  Laterality: N/A;  . TONSILLECTOMY    . TOTAL KNEE ARTHROPLASTY Left 05/19/2013  . TOTAL KNEE ARTHROPLASTY Left 05/19/2013   Procedure: TOTAL KNEE ARTHROPLASTY;  Surgeon: Newt Minion, MD;  Location: [place];  Service: Orthopedics;  Laterality: Left;  Left Total Knee Arthroplasty  . TOTAL KNEE ARTHROPLASTY Right 11/24/2019  . TOTAL KNEE ARTHROPLASTY Right 11/24/2019   Procedure: RIGHT TOTAL KNEE ARTHROPLASTY;  Surgeon: Newt Minion, MD;  Location: [place];  Service: Orthopedics;  Laterality: Right;  . TRANSTHORACIC ECHOCARDIOGRAM  03/28/2005   EF=>55%, mod conc LVH; LA mod dilated & borderline RA enlargement; mild TR; mild pulm valve regurg    Current Medications: Current Meds  Medication Sig  . acetaminophen (TYLENOL) 325 MG tablet Take 2 tablets (650 mg total) by mouth every 4 (four) hours as needed for headache or mild pain.  [name] apixaban (ELIQUIS) 5 MG TABS tablet Take 5 mg by mouth 2 (two) times daily.  [name] aspirin EC 81 MG EC tablet Take 1 tablet (81 mg total) by mouth daily. Swallow whole.  [name] atorvastatin (LIPITOR) 40 MG tablet Take 40 mg by mouth at bedtime. Reported on 01/11/2016  . Blood Glucose Monitoring Suppl (ONETOUCH VERIO FLEX SYSTEM) W/DEVICE KIT 1 each by Does not apply route daily. Dx: E11.9  . carvedilol (COREG) 12.5 MG tablet Take 1 tablet (12.5 mg total) by mouth 2 (two) times daily with a meal.  . cholecalciferol (VITAMIN D3) 25 MCG (1000 UNIT) tablet Take 1,000 Units by mouth daily.  . Cyanocobalamin (VITAMIN B12) 500 MCG TABS Take by mouth. Per patient taking 1 tablet twice a day  . ferrous sulfate 325 (65 FE) MG tablet Take 1 tablet by mouth 3 (three) times a week.  .  furosemide (LASIX) 20 MG tablet Take 3 tablets (60 mg total) by mouth in the AM, then take 2 tabs (40 mg total) by mouth in the PM for 3 days only, then go back to taking 2 tabs (40 mg total) by mouth daily thereafter.  [name] glipiZIDE (GLUCOTROL) 10 MG tablet Take 10 mg by mouth 2 (two) times daily before a meal.  . glucose blood (ONETOUCH VERIO) test strip Use to test blood sugar 2-3 times daily as instructed. Dx: E11.9  . levothyroxine (SYNTHROID) 112 MCG tablet Take 224 mcg by mouth daily before breakfast.  . losartan (COZAAR) 50 MG tablet Take 1 tablet (50 mg total) by mouth daily.  . metFORMIN (GLUCOPHAGE-XR) 500 MG 24 hr tablet Take  500 mg by mouth daily with breakfast.  . Multiple Vitamins-Minerals (MULTIVITAMIN WITH MINERALS) tablet Take 1 tablet by mouth daily.  . Multiple Vitamins-Minerals (PRESERVISION AREDS 2+MULTI VIT PO) Take 1 capsule by mouth in the morning and at bedtime.  . nitroGLYCERIN (NITROSTAT) 0.4 MG SL tablet DISSOLVE ONE TABLET UNDER THE TONGUE EVERY 5 MINUTES AS NEEDED FOR CHEST PAIN (REPEAT EVERY 5 MINUTES IF NEEDED FOR A TOTAL OF 3 TABLETS IN 15 MINUTES. IF NO RELIEF, CALL 911.)  . omeprazole (PRILOSEC) 20 MG capsule Take 1 capsule by mouth daily.  [name] DELICA LANCETS FINE MISC Use to test blood sugar 2-3 times daily as instructed. Dx: E11.9  . potassium chloride SA (KLOR-CON) 20 MEQ tablet Take 1 tablet (20 mEq total) by mouth 2 (two) times daily.  . traMADol (ULTRAM) 50 MG tablet Take 1 tablet (50 mg total) by mouth every 4 (four) hours as needed for moderate pain.  . VENTOLIN HFA 108 (90 Base) MCG/ACT inhaler Inhale 1-2 puffs into the lungs every 4 (four) hours as needed for wheezing or shortness of breath.   . [DISCONTINUED] vitamin B-12 (CYANOCOBALAMIN) 500 MCG tablet Take 1 tablet by mouth 2 (two) times daily.     Allergies:   Codeine and Demerol   Social History   Socioeconomic History  . Marital status: Married    Spouse name: sandra  . Number of  children: 3  . Years of education: tech  . Highest education level: Not on file  Occupational History    Employer: [employer]  Tobacco Use  . Smoking status: Current Every Day Smoker    Packs/day: 0.50    Years: 50.00    Pack years: 25.00    Types: Cigarettes  . Smokeless tobacco: Never Used  Vaping Use  . Vaping Use: Never used  Substance and Sexual Activity  . Alcohol use: No  . Drug use: No  . Sexual activity: Yes  Other Topics Concern  . Not on file  Social History Narrative  . Not on file   Social Determinants of Health   Financial Resource Strain: Not on file  Food Insecurity: Not on file  Transportation Needs: Not on file  Physical Activity: Not on file  Stress: Not on file  Social Connections: Not on file     Family History:  The patient's   family history includes Asthma in his child; Cancer in his maternal aunt and maternal aunt; Diabetes in his maternal grandmother; Diabetes type I in his child; Hyperlipidemia in his father; Thyroid disease in his mother.   ROS:   Please see the history of present illness.    ROS All other systems reviewed and are negative.   PHYSICAL EXAM:   VS:  BP 116/62   Pulse 76   Ht '5\' 10"'  (1.778 m)   Wt 244 lb 12.8 oz (111 kg)   SpO2 98%   BMI 35.13 kg/m   Physical Exam  GEN: Well nourished, well developed, in no acute distress  Neck: no JVD, carotid bruits, or masses Cardiac:irreg irreg with 1/6 systolic murmur LSB Respiratory: wheezing left lung base GI: soft, nontender, nondistended, + BS Ext: without cyanosis, clubbing, or edema, Good distal pulses bilaterally Neuro:  Alert and Oriented x 3 Psych: euthymic mood, full affect  Wt Readings from Last 3 Encounters:  10/17/20 244 lb 12.8 oz (111 kg)  10/02/20 256 lb (116.1 kg)  09/17/20 253 lb 14.4 oz (115.2 kg)      Studies/Labs Reviewed:  EKG:  EKG is not ordered today.   Recent Labs: 09/05/2020: ALT 21 09/06/2020: B Natriuretic Peptide 803.0; TSH 1.317 09/12/2020:  Magnesium 2.9 09/16/2020: Hemoglobin 8.5; Platelets 149 10/02/2020: NT-Pro BNP 2,384 10/09/2020: BUN 18; Creatinine, Ser 0.86; Potassium 4.6; Sodium 142   Lipid Panel    Component Value Date/Time   CHOL 106 04/12/2016 1423   TRIG 109.0 04/12/2016 1423   HDL 27.50 (L) 04/12/2016 1423   CHOLHDL 4 04/12/2016 1423   VLDL 21.8 04/12/2016 1423   LDLCALC 57 04/12/2016 1423    Additional studies/ records that were reviewed today include:  TTE 09/06/20: 1. Left ventricular ejection fraction, by estimation, is 45 to 50%. The  left ventricle has mildly decreased function. The left ventricle  demonstrates global hypokinesis. Left ventricular diastolic parameters are  indeterminate.   2. Right ventricular systolic function is normal. The right ventricular  size is normal. Tricuspid regurgitation signal is inadequate for assessing  PA pressure.   3. Left atrial size was moderately dilated.   4. Right atrial size was mildly dilated.   5. The mitral valve is normal in structure. Trivial mitral valve  regurgitation. No evidence of mitral stenosis. Moderate mitral annular  calcification.   6. The aortic valve is tricuspid. Aortic valve regurgitation is not  visualized. Severe aortic valve stenosis. Aortic valve area, by VTI  measures 0.67 cm. Aortic valve mean gradient measures 35.0 mmHg.   7. Aortic dilatation noted. 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   8. The inferior vena cava is normal in size with <50% respiratory  variability, suggesting right atrial pressure of 8 mmHg.    Cath 09/06/20:    Prox RCA lesion is 80% stenosed.  Mid RCA lesion is 80% stenosed.  Prox LAD lesion is 80% stenosed.  Hemodynamic findings consistent with aortic valve stenosis.     CABG Op Note 09/11/20: Procedure:   1. Median Sternotomy 2. Extracorporeal circulation 3.   Coronary artery bypass grafting x 2    Left internal mammary artery graft to the LAD  SVG to PD     4.    Endoscopic vein harvest from the right leg 5.   Aortic valve replacement using a 25 mm Edwards INSPIRIS RESILIA pericardial valve (model 11500A, SN [id number])      Risk Assessment/Calculations:    CHA2DS2-VASc Score = 7  This indicates a 11.2% annual risk of stroke. The patient's score is based upon: CHF History: Yes HTN History: Yes Diabetes History: Yes Stroke History: Yes Vascular Disease History: Yes Age Score: 1 Gender Score: 0        ASSESSMENT:    1. Chronic combined systolic and diastolic CHF (congestive heart failure) ([place])   2. Coronary artery disease involving coronary bypass graft of native heart without angina pectoris   3. Persistent atrial fibrillation ([place])   4. Cerebrovascular accident (CVA), unspecified mechanism ([place])   5. DOE (dyspnea on exertion)      PLAN:  In order of problems listed above:  Preoperative clearance for dental surgery-tooth extraction. Dr. Johney Frame recommends lovenox bridge if Eliquis needs to be help but he must stay on ASA. Surgery with Dr. Shann Medal.  Chronic combined systolic and diastolic CHF EF 45 to 17% on echo 09/06/2020 fluid overloaded office visit 10/02/2020 with increasing diuretics-now compensated and watching salt closely  CAD status post CABG and AVR with pericardial tissue valve 09/11/2020 going to do cardiac rehab  Persistent atrial fibrillation status post left atrial  clipping on a apixaban and Coreg  History of CVA on eliquis and ASA  DOE with wheezing and ongoing tobacco use-smoking cessation discussed. To see pulmonary at [facility]    Shared Decision Making/Informed Consent        Medication Adjustments/Labs and Tests Ordered: Current medicines are reviewed at length with the patient today.  Concerns regarding medicines are outlined above.  Medication changes, Labs and Tests ordered today are listed in the Patient Instructions below. Patient Instructions   Medication Instructions:   Your physician  recommends that you continue on your current medications as directed. Please refer to the Current Medication list given to you today.  *If you need a refill on your cardiac medications before your next appointment, please call your pharmacy*   Lab Work:  TODAY--BMET AND PRO-BNP  If you have labs (blood work) drawn today and your tests are completely normal, you will receive your results only by: [name] MyChart Message (if you have MyChart) OR . A paper copy in the mail If you have any lab test that is abnormal or we need to change your treatment, we will call you to review the results.    Follow-Up:  IN THE OFFICE WITH DR. Johney Frame ON January 01, 2021 AT ANY OPEN SLOT PER Damoni Erker PA-C--SCHEDULING OK TO ADD AT ANY OPEN SLOT THAT DAY    --PLEASE FOLLOW A LOW SODIUM DIET     Low-Sodium Eating Plan Sodium, which is an element that makes up salt, helps you maintain a healthy balance of fluids in your body. Too much sodium can increase your blood pressure and cause fluid and waste to be held in your body. Your health care provider or dietitian may recommend following this plan if you have high blood pressure (hypertension), kidney disease, liver disease, or heart failure. Eating less sodium can help lower your blood pressure, reduce swelling, and protect your heart, liver, and kidneys. What are tips for following this plan? Reading food labels  The Nutrition Facts label lists the amount of sodium in one serving of the food. If you eat more than one serving, you must multiply the listed amount of sodium by the number of servings.  Choose foods with less than 140 mg of sodium per serving.  Avoid foods with 300 mg of sodium or more per serving. Shopping  Look for lower-sodium products, often labeled as "low-sodium" or "no salt added."  Always check the sodium content, even if foods are labeled as "unsalted" or "no salt added."  Buy fresh foods. ? Avoid canned foods and pre-made or  frozen meals. ? Avoid canned, cured, or processed meats.  Buy breads that have less than 80 mg of sodium per slice.   Cooking  Eat more home-cooked food and less restaurant, buffet, and fast food.  Avoid adding salt when cooking. Use salt-free seasonings or herbs instead of table salt or sea salt. Check with your health care provider or pharmacist before using salt substitutes.  Cook with plant-based oils, such as canola, sunflower, or olive oil.   Meal planning  When eating at a restaurant, ask that your food be prepared with less salt or no salt, if possible. Avoid dishes labeled as brined, pickled, cured, smoked, or made with soy sauce, miso, or teriyaki sauce.  Avoid foods that contain MSG (monosodium glutamate). MSG is sometimes added to [place] food, bouillon, and some canned foods.  Make meals that can be grilled, baked, poached, roasted, or steamed. These are generally made with less  sodium. General information Most people on this plan should limit their sodium intake to 1,500-2,000 mg (milligrams) of sodium each day. What foods should I eat? Fruits Fresh, frozen, or canned fruit. Fruit juice. Vegetables Fresh or frozen vegetables. "No salt added" canned vegetables. "No salt added" tomato sauce and paste. Low-sodium or reduced-sodium tomato and vegetable juice. Grains Low-sodium cereals, including oats, puffed wheat and rice, and shredded wheat. Low-sodium crackers. Unsalted rice. Unsalted pasta. Low-sodium bread. Whole-grain breads and whole-grain pasta. Meats and other proteins Fresh or frozen (no salt added) meat, poultry, seafood, and fish. Low-sodium canned tuna and salmon. Unsalted nuts. Dried peas, beans, and lentils without added salt. Unsalted canned beans. Eggs. Unsalted nut butters. Dairy Milk. Soy milk. Cheese that is naturally low in sodium, such as ricotta cheese, fresh mozzarella, or Swiss cheese. Low-sodium or reduced-sodium cheese. Cream cheese.  Yogurt. Seasonings and condiments Fresh and dried herbs and spices. Salt-free seasonings. Low-sodium mustard and ketchup. Sodium-free salad dressing. Sodium-free light mayonnaise. Fresh or refrigerated horseradish. Lemon juice. Vinegar. Other foods Homemade, reduced-sodium, or low-sodium soups. Unsalted popcorn and pretzels. Low-salt or salt-free chips. The items listed above may not be a complete list of foods and beverages you can eat. Contact a dietitian for more information. What foods should I avoid? Vegetables Sauerkraut, pickled vegetables, and relishes. Olives. [place] fries. Onion rings. Regular canned vegetables (not low-sodium or reduced-sodium). Regular canned tomato sauce and paste (not low-sodium or reduced-sodium). Regular tomato and vegetable juice (not low-sodium or reduced-sodium). Frozen vegetables in sauces. Grains Instant hot cereals. Bread stuffing, pancake, and biscuit mixes. Croutons. Seasoned rice or pasta mixes. Noodle soup cups. Boxed or frozen macaroni and cheese. Regular salted crackers. Self-rising flour. Meats and other proteins Meat or fish that is salted, canned, smoked, spiced, or pickled. Precooked or cured meat, such as sausages or meat loaves. Berniece Salines. Ham. Pepperoni. Hot dogs. Corned beef. Chipped beef. Salt pork. Jerky. Pickled herring. Anchovies and sardines. Regular canned tuna. Salted nuts. Dairy Processed cheese and cheese spreads. Hard cheeses. Cheese curds. Blue cheese. Feta cheese. String cheese. Regular cottage cheese. Buttermilk. Canned milk. Fats and oils Salted butter. Regular margarine. Ghee. Bacon fat. Seasonings and condiments Onion salt, garlic salt, seasoned salt, table salt, and sea salt. Canned and packaged gravies. Worcestershire sauce. Tartar sauce. Barbecue sauce. Teriyaki sauce. Soy sauce, including reduced-sodium. Steak sauce. Fish sauce. Oyster sauce. Cocktail sauce. Horseradish that you find on the shelf. Regular ketchup and mustard. Meat  flavorings and tenderizers. Bouillon cubes. Hot sauce. Pre-made or packaged marinades. Pre-made or packaged taco seasonings. Relishes. Regular salad dressings. Salsa. Other foods Salted popcorn and pretzels. Corn chips and puffs. Potato and tortilla chips. Canned or dried soups. Pizza. Frozen entrees and pot pies. The items listed above may not be a complete list of foods and beverages you should avoid. Contact a dietitian for more information. Summary  Eating less sodium can help lower your blood pressure, reduce swelling, and protect your heart, liver, and kidneys.  Most people on this plan should limit their sodium intake to 1,500-2,000 mg (milligrams) of sodium each day.  Canned, boxed, and frozen foods are high in sodium. Restaurant foods, fast foods, and pizza are also very high in sodium. You also get sodium by adding salt to food.  Try to cook at home, eat more fresh fruits and vegetables, and eat less fast food and canned, processed, or prepared foods. This information is not intended to replace advice given to you by your health care provider. Make sure you  discuss any questions you have with your health care provider. Document Revised: 07/30/2019 Document Reviewed: 05/26/2019 Elsevier Patient Education  2021 [place], Ermalinda Barrios, [place]  10/17/2020 1:59 PM    [facility] [address], [place], [place]  [address] Phone: [phone]; Fax: [phone]

## 2020-10-12 ENCOUNTER — Telehealth: Payer: Self-pay | Admitting: *Deleted

## 2020-10-12 NOTE — Telephone Encounter (Signed)
-----   Message from [name], RN sent at 10/11/2020  8:22 AM EDT ----- Regarding: RE: cardiac rehab referral ordered at last OV on 3/28 by Theressa Millard,  The patients recollection is not entirely correct.  Support staff called and spoke with him on 3/16 -  Clack, Tawny Hopping (Newest Message First)  Lonia Mad     09/20/20 10:13 AM Note Called and spoke with pt in regards to CR, pt stated he is interested and will like to use his VA. Adv pt he will need to contact the [place] for auth, pt verbalized understanding. Will contact patient for scheduling once f/u has been completed.  Support staff contacted the [place] -  Shanon Brow     09/20/20 2:20 PM Note Pt wife called back and stated that she spoke with the [place] and they stated that its already a auth in the pt chart and for [place] to use that one. Printed the pt VA auth out. The pt VA auth is valid from 08/21/2020-02/17/2021. The auth is [id number]. Spoke with pt wife and advised her that his [place] Berkley Harvey was printed and that we would contact him once his f/u visit has been completed. Pt wife understood.     Yes he is awaiting scheduling.  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  I am sure this is not unlike what many areas in health care are facing.  Please know that we are working very hard to maximize our present space and are looking forward to moving to larger facility where we can serve more patients and lessen the wait time. I appreciate your patience and rest assure that we have him on our list and when his time comes up we will contact him for scheduling.   ----- Message ----- From: Cammy Copa, RN Sent: 10/10/2020   2:49 PM EDT To: [name], RN, [name], LPN Subject: RE: cardiac  rehab referral ordered at last O#  Hello Anela Bensman   Will forward to [facility] our nurse navigator we have a wait list and delays due to current capacity.   ----- Message ----- From: [name], LPN Sent: 01/08/1286   2:22 PM EDT To: Cammy Copa, RN, Pricilla Holm, # Subject: cardiac rehab referral ordered at last OV on#  This pt was referred to Cardiac Rehab when he saw Dr. Shari Prows last week on 10/02/20.  The pt and his wife have heard nothing about the referral and from Cardiac Rehab.  He needs this for S/P CABG.  Can you please help with this referral and getting him in there and let me know.  Call the pt and his wife.    Thanks, [employer]

## 2020-10-16 NOTE — Telephone Encounter (Signed)
Thank you so much for the heads up!!! Hope you're doing well.

## 2020-10-16 NOTE — Telephone Encounter (Signed)
Mason Patton,  Just wanted to update you on this patient. I spoke with Dr. Shari Prows about this patient last week when we were both rounding in the hospital. She did want to wait until patient's visit with you on 10/17/2020 before giving him the OK to proceed with dental procedure.   Also, it does not sound like Eliquis or Aspirin should need to be held given he is only having  1 possible tooth extraction. However, Dr. Shari Prows said if Eliquis did need to be held, patient would need Lovenox bridge given his is just over 1 month out from his surgery. Aspirin; however, should not be held.  Thanks so much! Jonel Sick

## 2020-10-17 ENCOUNTER — Encounter: Payer: Self-pay | Admitting: Physician Assistant

## 2020-10-17 ENCOUNTER — Ambulatory Visit (INDEPENDENT_AMBULATORY_CARE_PROVIDER_SITE_OTHER): Payer: Medicare Other | Admitting: Physician Assistant

## 2020-10-17 ENCOUNTER — Other Ambulatory Visit: Payer: Self-pay

## 2020-10-17 VITALS — BP 116/62 | HR 76 | Ht 70.0 in | Wt 244.8 lb

## 2020-10-17 DIAGNOSIS — I639 Cerebral infarction, unspecified: Secondary | ICD-10-CM | POA: Diagnosis not present

## 2020-10-17 DIAGNOSIS — I25810 Atherosclerosis of coronary artery bypass graft(s) without angina pectoris: Secondary | ICD-10-CM | POA: Diagnosis not present

## 2020-10-17 DIAGNOSIS — I5042 Chronic combined systolic (congestive) and diastolic (congestive) heart failure: Secondary | ICD-10-CM

## 2020-10-17 DIAGNOSIS — I4819 Other persistent atrial fibrillation: Secondary | ICD-10-CM

## 2020-10-17 DIAGNOSIS — R0609 Other forms of dyspnea: Secondary | ICD-10-CM

## 2020-10-17 DIAGNOSIS — R0600 Dyspnea, unspecified: Secondary | ICD-10-CM

## 2020-10-17 DIAGNOSIS — I209 Angina pectoris, unspecified: Secondary | ICD-10-CM

## 2020-10-17 NOTE — Telephone Encounter (Signed)
Message  Patient to have dental extraction and wife says nothing needs held. If they do need to hold Eliquis Dr. Shari Prows says he'll need a lovenox bridge and not to hold ASA. CHF compensated. To see pulmonary at [facility] for DOE.  Dr. Deberah Pelton on [place] will be doing the extraction.  Thanks,  Mason Patton

## 2020-10-17 NOTE — Patient Instructions (Addendum)
Medication Instructions:   Your physician recommends that you continue on your current medications as directed. Please refer to the Current Medication list given to you today.  *If you need a refill on your cardiac medications before your next appointment, please call your pharmacy*   Lab Work:  TODAY--BMET AND PRO-BNP  If you have labs (blood work) drawn today and your tests are completely normal, you will receive your results only by: [name] MyChart Message (if you have MyChart) OR . A paper copy in the mail If you have any lab test that is abnormal or we need to change your treatment, we will call you to review the results.    Follow-Up:  IN THE OFFICE WITH DR. Shari Prows ON January 01, 2021 AT ANY OPEN SLOT PER MICHELE LENZE PA-C--SCHEDULING OK TO ADD AT ANY OPEN SLOT THAT DAY    --PLEASE FOLLOW A LOW SODIUM DIET     Low-Sodium Eating Plan Sodium, which is an element that makes up salt, helps you maintain a healthy balance of fluids in your body. Too much sodium can increase your blood pressure and cause fluid and waste to be held in your body. Your health care provider or dietitian may recommend following this plan if you have high blood pressure (hypertension), kidney disease, liver disease, or heart failure. Eating less sodium can help lower your blood pressure, reduce swelling, and protect your heart, liver, and kidneys. What are tips for following this plan? Reading food labels  The Nutrition Facts label lists the amount of sodium in one serving of the food. If you eat more than one serving, you must multiply the listed amount of sodium by the number of servings.  Choose foods with less than 140 mg of sodium per serving.  Avoid foods with 300 mg of sodium or more per serving. Shopping  Look for lower-sodium products, often labeled as "low-sodium" or "no salt added."  Always check the sodium content, even if foods are labeled as "unsalted" or "no salt added."  Buy fresh  foods. ? Avoid canned foods and pre-made or frozen meals. ? Avoid canned, cured, or processed meats.  Buy breads that have less than 80 mg of sodium per slice.   Cooking  Eat more home-cooked food and less restaurant, buffet, and fast food.  Avoid adding salt when cooking. Use salt-free seasonings or herbs instead of table salt or sea salt. Check with your health care provider or pharmacist before using salt substitutes.  Cook with plant-based oils, such as canola, sunflower, or olive oil.   Meal planning  When eating at a restaurant, ask that your food be prepared with less salt or no salt, if possible. Avoid dishes labeled as brined, pickled, cured, smoked, or made with soy sauce, miso, or teriyaki sauce.  Avoid foods that contain MSG (monosodium glutamate). MSG is sometimes added to [place] food, bouillon, and some canned foods.  Make meals that can be grilled, baked, poached, roasted, or steamed. These are generally made with less sodium. General information Most people on this plan should limit their sodium intake to 1,500-2,000 mg (milligrams) of sodium each day. What foods should I eat? Fruits Fresh, frozen, or canned fruit. Fruit juice. Vegetables Fresh or frozen vegetables. "No salt added" canned vegetables. "No salt added" tomato sauce and paste. Low-sodium or reduced-sodium tomato and vegetable juice. Grains Low-sodium cereals, including oats, puffed wheat and rice, and shredded wheat. Low-sodium crackers. Unsalted rice. Unsalted pasta. Low-sodium bread. Whole-grain breads and whole-grain pasta. Meats and  other proteins Fresh or frozen (no salt added) meat, poultry, seafood, and fish. Low-sodium canned tuna and salmon. Unsalted nuts. Dried peas, beans, and lentils without added salt. Unsalted canned beans. Eggs. Unsalted nut butters. Dairy Milk. Soy milk. Cheese that is naturally low in sodium, such as ricotta cheese, fresh mozzarella, or Swiss cheese. Low-sodium or  reduced-sodium cheese. Cream cheese. Yogurt. Seasonings and condiments Fresh and dried herbs and spices. Salt-free seasonings. Low-sodium mustard and ketchup. Sodium-free salad dressing. Sodium-free light mayonnaise. Fresh or refrigerated horseradish. Lemon juice. Vinegar. Other foods Homemade, reduced-sodium, or low-sodium soups. Unsalted popcorn and pretzels. Low-salt or salt-free chips. The items listed above may not be a complete list of foods and beverages you can eat. Contact a dietitian for more information. What foods should I avoid? Vegetables Sauerkraut, pickled vegetables, and relishes. Olives. [place] fries. Onion rings. Regular canned vegetables (not low-sodium or reduced-sodium). Regular canned tomato sauce and paste (not low-sodium or reduced-sodium). Regular tomato and vegetable juice (not low-sodium or reduced-sodium). Frozen vegetables in sauces. Grains Instant hot cereals. Bread stuffing, pancake, and biscuit mixes. Croutons. Seasoned rice or pasta mixes. Noodle soup cups. Boxed or frozen macaroni and cheese. Regular salted crackers. Self-rising flour. Meats and other proteins Meat or fish that is salted, canned, smoked, spiced, or pickled. Precooked or cured meat, such as sausages or meat loaves. Tomasa Blase. Ham. Pepperoni. Hot dogs. Corned beef. Chipped beef. Salt pork. Jerky. Pickled herring. Anchovies and sardines. Regular canned tuna. Salted nuts. Dairy Processed cheese and cheese spreads. Hard cheeses. Cheese curds. Blue cheese. Feta cheese. String cheese. Regular cottage cheese. Buttermilk. Canned milk. Fats and oils Salted butter. Regular margarine. Ghee. Bacon fat. Seasonings and condiments Onion salt, garlic salt, seasoned salt, table salt, and sea salt. Canned and packaged gravies. Worcestershire sauce. Tartar sauce. Barbecue sauce. Teriyaki sauce. Soy sauce, including reduced-sodium. Steak sauce. Fish sauce. Oyster sauce. Cocktail sauce. Horseradish that you find on the  shelf. Regular ketchup and mustard. Meat flavorings and tenderizers. Bouillon cubes. Hot sauce. Pre-made or packaged marinades. Pre-made or packaged taco seasonings. Relishes. Regular salad dressings. Salsa. Other foods Salted popcorn and pretzels. Corn chips and puffs. Potato and tortilla chips. Canned or dried soups. Pizza. Frozen entrees and pot pies. The items listed above may not be a complete list of foods and beverages you should avoid. Contact a dietitian for more information. Summary  Eating less sodium can help lower your blood pressure, reduce swelling, and protect your heart, liver, and kidneys.  Most people on this plan should limit their sodium intake to 1,500-2,000 mg (milligrams) of sodium each day.  Canned, boxed, and frozen foods are high in sodium. Restaurant foods, fast foods, and pizza are also very high in sodium. You also get sodium by adding salt to food.  Try to cook at home, eat more fresh fruits and vegetables, and eat less fast food and canned, processed, or prepared foods. This information is not intended to replace advice given to you by your health care provider. Make sure you discuss any questions you have with your health care provider. Document Revised: 07/30/2019 Document Reviewed: 05/26/2019 Elsevier Patient Education  2021 [employer].

## 2020-10-18 ENCOUNTER — Other Ambulatory Visit: Payer: Self-pay

## 2020-10-18 ENCOUNTER — Ambulatory Visit: Payer: No Typology Code available for payment source | Admitting: Surgery

## 2020-10-18 ENCOUNTER — Other Ambulatory Visit: Payer: Self-pay | Admitting: Surgery

## 2020-10-18 DIAGNOSIS — Z951 Presence of aortocoronary bypass graft: Secondary | ICD-10-CM

## 2020-10-18 DIAGNOSIS — I4819 Other persistent atrial fibrillation: Secondary | ICD-10-CM

## 2020-10-18 LAB — BASIC METABOLIC PANEL
BUN/Creatinine Ratio: 18 (ref 10–24)
BUN: 17 mg/dL (ref 8–27)
CO2: 25 mmol/L (ref 20–29)
Calcium: 9.8 mg/dL (ref 8.6–10.2)
Chloride: 102 mmol/L (ref 96–106)
Creatinine, Ser: 0.92 mg/dL (ref 0.76–1.27)
Glucose: 123 mg/dL — ABNORMAL HIGH (ref 65–99)
Potassium: 5.6 mmol/L — ABNORMAL HIGH (ref 3.5–5.2)
Sodium: 145 mmol/L — ABNORMAL HIGH (ref 134–144)
eGFR: 87 mL/min/{1.73_m2} (ref >59)

## 2020-10-18 LAB — PRO B NATRIURETIC PEPTIDE: NT-Pro BNP: 1481 pg/mL — ABNORMAL HIGH (ref 0–376)

## 2020-10-19 ENCOUNTER — Other Ambulatory Visit: Payer: Self-pay | Admitting: Surgery

## 2020-10-19 ENCOUNTER — Encounter: Payer: Self-pay | Admitting: Surgery

## 2020-10-19 ENCOUNTER — Ambulatory Visit (INDEPENDENT_AMBULATORY_CARE_PROVIDER_SITE_OTHER): Payer: Self-pay | Admitting: Surgery

## 2020-10-19 ENCOUNTER — Ambulatory Visit
Admission: RE | Admit: 2020-10-19 | Discharge: 2020-10-19 | Disposition: A | Discharge status: Home / Self Care | Payer: No Typology Code available for payment source | Source: Ambulatory Visit | Attending: Surgery | Admitting: Surgery

## 2020-10-19 ENCOUNTER — Other Ambulatory Visit: Payer: Self-pay

## 2020-10-19 VITALS — BP 119/67 | HR 76 | Resp 20 | Wt 243.0 lb

## 2020-10-19 DIAGNOSIS — Z951 Presence of aortocoronary bypass graft: Secondary | ICD-10-CM

## 2020-10-20 ENCOUNTER — Other Ambulatory Visit: Payer: Non-veteran care | Admitting: *Deleted

## 2020-10-20 DIAGNOSIS — I4819 Other persistent atrial fibrillation: Secondary | ICD-10-CM

## 2020-10-21 LAB — BASIC METABOLIC PANEL
BUN/Creatinine Ratio: 17 (ref 10–24)
BUN: 16 mg/dL (ref 8–27)
CO2: 23 mmol/L (ref 20–29)
Calcium: 9.4 mg/dL (ref 8.6–10.2)
Chloride: 102 mmol/L (ref 96–106)
Creatinine, Ser: 0.92 mg/dL (ref 0.76–1.27)
Glucose: 121 mg/dL — ABNORMAL HIGH (ref 65–99)
Potassium: 4.3 mmol/L (ref 3.5–5.2)
Sodium: 141 mmol/L (ref 134–144)
eGFR: 87 mL/min/{1.73_m2} (ref >59)

## 2020-10-23 ENCOUNTER — Ambulatory Visit (HOSPITAL_BASED_OUTPATIENT_CLINIC_OR_DEPARTMENT_OTHER): Payer: No Typology Code available for payment source

## 2020-10-23 ENCOUNTER — Other Ambulatory Visit: Payer: Self-pay

## 2020-10-23 ENCOUNTER — Ambulatory Visit (HOSPITAL_COMMUNITY): Payer: No Typology Code available for payment source

## 2020-10-23 ENCOUNTER — Other Ambulatory Visit (HOSPITAL_COMMUNITY)
Admission: RE | Admit: 2020-10-23 | Discharge: 2020-10-23 | Disposition: A | Discharge status: Home / Self Care | Payer: No Typology Code available for payment source | Source: Ambulatory Visit | Attending: Surgery | Admitting: Surgery

## 2020-10-23 DIAGNOSIS — Z01812 Encounter for preprocedural laboratory examination: Secondary | ICD-10-CM | POA: Diagnosis not present

## 2020-10-23 DIAGNOSIS — I350 Nonrheumatic aortic (valve) stenosis: Secondary | ICD-10-CM

## 2020-10-23 DIAGNOSIS — Z20822 Contact with and (suspected) exposure to covid-19: Secondary | ICD-10-CM | POA: Insufficient documentation

## 2020-10-23 LAB — ECHOCARDIOGRAM COMPLETE
AR max vel: 1.24 cm2
AV Area VTI: 1.29 cm2
AV Area mean vel: 1.09 cm2
AV Mean grad: 12 mmHg
AV Peak grad: 20.1 mmHg
Ao pk vel: 2.24 m/s
Area-P 1/2: 2.82 cm2
S' Lateral: 4.2 cm

## 2020-10-23 LAB — SARS CORONAVIRUS 2 (TAT 6-24 HRS): SARS Coronavirus 2: NEGATIVE

## 2020-10-24 ENCOUNTER — Telehealth: Payer: Self-pay | Admitting: Cardiology

## 2020-10-24 NOTE — Telephone Encounter (Signed)
Encounter not needed nurse tool the call

## 2020-10-25 ENCOUNTER — Ambulatory Visit (HOSPITAL_COMMUNITY)
Admission: RE | Admit: 2020-10-25 | Discharge: 2020-10-25 | Disposition: A | Discharge status: Home / Self Care | Payer: Medicare Other | Source: Ambulatory Visit | Attending: Surgery | Admitting: Surgery

## 2020-10-25 ENCOUNTER — Other Ambulatory Visit: Payer: Self-pay

## 2020-10-25 ENCOUNTER — Other Ambulatory Visit: Payer: Self-pay | Admitting: Surgery

## 2020-10-25 DIAGNOSIS — Z951 Presence of aortocoronary bypass graft: Secondary | ICD-10-CM

## 2020-10-25 DIAGNOSIS — J90 Pleural effusion, not elsewhere classified: Secondary | ICD-10-CM | POA: Insufficient documentation

## 2020-10-25 HISTORY — PX: IR THORACENTESIS ASP PLEURAL SPACE W/IMG GUIDE: IMG5380

## 2020-10-25 MED ORDER — LIDOCAINE HCL (PF) 1 % IJ SOLN
INTRAMUSCULAR | Status: AC
  Filled 2020-10-25: qty 30

## 2020-10-25 MED ORDER — LIDOCAINE HCL (PF) 1 % IJ SOLN
INTRAMUSCULAR | Status: DC | PRN
  Administered 2020-10-25: 10 mL

## 2020-10-25 NOTE — Procedures (Signed)
PROCEDURE SUMMARY:  Successful US guided left thoracentesis. Yielded 1 L of amber fluid. Patient tolerated procedure well. No immediate complications. EBL = trace  Post procedure chest X-ray reveals no pneumothorax  Salah Burlison S Rayland Hamed PA-C 10/25/2020 3:30 PM

## 2020-10-26 NOTE — Progress Notes (Signed)
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 The patient's early postoperative recovery while in the hospital was notable for an uncomplicated postoperative course. Since hospital discharge the patient reports that he has been feeling fairly well.  He still has some shortness of breath with exertion..   Current Outpatient Medications  Medication Sig Dispense Refill  . acetaminophen (TYLENOL) 325 MG tablet Take 2 tablets (650 mg total) by mouth every 4 (four) hours as needed for headache or mild pain.    [name] apixaban (ELIQUIS) 5 MG TABS tablet Take 5 mg by mouth 2 (two) times daily.    [name] aspirin EC 81 MG EC tablet Take 1 tablet (81 mg total) by mouth daily. Swallow whole. 30 tablet 11  . atorvastatin (LIPITOR) 40 MG tablet Take 40 mg by mouth at bedtime. Reported on 01/11/2016    . Blood Glucose Monitoring Suppl (ONETOUCH VERIO FLEX SYSTEM) W/DEVICE KIT 1 each by Does not apply route daily. Dx: E11.9 1 kit 0  . carvedilol (COREG) 12.5 MG tablet Take 1 tablet (12.5 mg total) by mouth 2 (two) times daily with a meal. 60 tablet 1  . cholecalciferol (VITAMIN D3) 25 MCG (1000 UNIT) tablet Take 1,000 Units by mouth daily.    . Cyanocobalamin (VITAMIN B12) 500 MCG TABS Take by mouth. Per patient taking 1 tablet twice a day    . ferrous sulfate 325 (65 FE) MG tablet Take 1 tablet by mouth 3 (three) times a week.    . furosemide (LASIX) 20 MG tablet Take 3 tablets (60 mg total) by mouth in the AM, then take 2 tabs (40 mg total) by mouth in the PM for 3 days only, then go back to taking 2 tabs (40 mg total) by mouth daily thereafter. 67 tablet 0  . glipiZIDE (GLUCOTROL) 10 MG tablet Take 10 mg by mouth 2 (two) times daily before a meal.    . glucose blood (ONETOUCH VERIO) test strip Use to test blood sugar 2-3 times daily as  instructed. Dx: E11.9 200 each 3  . levothyroxine (SYNTHROID) 112 MCG tablet Take 224 mcg by mouth daily before breakfast.    . losartan (COZAAR) 50 MG tablet Take 1 tablet (50 mg total) by mouth daily. 90 tablet 2  . metFORMIN (GLUCOPHAGE-XR) 500 MG 24 hr tablet Take 500 mg by mouth daily with breakfast.    . Multiple Vitamins-Minerals (MULTIVITAMIN WITH MINERALS) tablet Take 1 tablet by mouth daily.    . Multiple Vitamins-Minerals (PRESERVISION AREDS 2+MULTI VIT PO) Take 1 capsule by mouth in the morning and at bedtime.    . nitroGLYCERIN (NITROSTAT) 0.4 MG SL tablet DISSOLVE ONE TABLET UNDER THE TONGUE EVERY 5 MINUTES AS NEEDED FOR CHEST PAIN (REPEAT EVERY 5 MINUTES IF NEEDED FOR A TOTAL OF 3 TABLETS IN 15 MINUTES. IF NO RELIEF, CALL 911.)    . omeprazole (PRILOSEC) 20 MG capsule Take 1 capsule by mouth daily.    [name] DELICA LANCETS FINE MISC Use to test blood sugar 2-3 times daily as instructed. Dx: E11.9 200 each 3  . potassium chloride SA (KLOR-CON) 20 MEQ tablet Take 1 tablet (20 mEq total) by mouth 2 (two) times daily. 180 tablet 0  . traMADol (ULTRAM) 50 MG tablet Take 1 tablet (50 mg total) by mouth every 4 (four) hours as needed for moderate  pain. 30 tablet 0  . VENTOLIN HFA 108 (90 Base) MCG/ACT inhaler Inhale 1-2 puffs into the lungs every 4 (four) hours as needed for wheezing or shortness of breath.      No current facility-administered medications for this visit.    Physical Exam: BP 119/67 (BP Location: Right Arm, Patient Position: Sitting)   Pulse 76   Resp 20   Wt 243 lb (110.2 kg)   SpO2 95%   BMI 34.87 kg/m  He looks well. Cardiac exam shows a regular rate and rhythm with normal heart sounds.  There is no murmur. Lung exam reveals decreased breath sounds over the left lower lobe. The chest incision is healing well and the sternum is stable. The leg incisions are healing well and there is mild bilateral lower extremity edema.  Diagnostic Tests:  Narrative &  Impression  CLINICAL DATA:  Status post coronary bypass grafting  EXAM: CHEST - 2 VIEW  COMPARISON:  09/16/2020  FINDINGS: Cardiac shadow is enlarged but stable. Slight increase in left-sided pleural effusion is noted when compared with the prior exam. Postsurgical changes are again seen. The lungs are otherwise clear.  IMPRESSION: Slight increase in left-sided pleural effusion.   Electronically Signed   By: Inez Catalina M.D.   On: 10/19/2020 13:40      Impression:  Overall he is doing fairly well following his surgery.  He still has some exertional shortness of breath and chest x-ray shows a moderate left pleural effusion that is slightly larger than was on his previous chest x-ray.  I think it would be best to have this drained by thoracentesis.  I reviewed the chest x-ray with him and his wife and they are in agreement with that.  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  Plan:  He will be scheduled for a left thoracentesis by interventional radiology.  He is on Eliquis which may need to be stopped for couple days before that procedure.  I encouraged him to continue ambulating and use his incentive spirometer.  I will plan to see him back about 2 weeks following his thoracentesis with a repeat chest x-ray.   Gaye Pollack, MD [facility] [phone]

## 2020-10-30 ENCOUNTER — Other Ambulatory Visit: Payer: Self-pay | Admitting: Surgery

## 2020-10-30 DIAGNOSIS — Z9889 Other specified postprocedural states: Secondary | ICD-10-CM

## 2020-10-31 ENCOUNTER — Ambulatory Visit: Payer: Self-pay | Admitting: Surgery

## 2020-11-01 ENCOUNTER — Ambulatory Visit
Admission: RE | Admit: 2020-11-01 | Discharge: 2020-11-01 | Disposition: A | Discharge status: Home / Self Care | Payer: Medicare Other | Source: Ambulatory Visit | Attending: Surgery | Admitting: Surgery

## 2020-11-01 ENCOUNTER — Ambulatory Visit (INDEPENDENT_AMBULATORY_CARE_PROVIDER_SITE_OTHER): Payer: Self-pay | Admitting: Surgery

## 2020-11-01 ENCOUNTER — Other Ambulatory Visit: Payer: Self-pay

## 2020-11-01 ENCOUNTER — Encounter: Payer: Self-pay | Admitting: Surgery

## 2020-11-01 VITALS — BP 153/69 | HR 74 | Resp 20 | Ht 70.0 in | Wt 241.0 lb

## 2020-11-01 DIAGNOSIS — Z951 Presence of aortocoronary bypass graft: Secondary | ICD-10-CM

## 2020-11-01 DIAGNOSIS — Z9889 Other specified postprocedural states: Secondary | ICD-10-CM

## 2020-11-01 NOTE — Progress Notes (Signed)
HPI: Mason Patton returns today for follow-up after left thoracentesis for postoperative left pleural effusion.  He had 1 L of amber fluid removed.  He said his breathing is much better.  He has no other complaints.  He is scheduled to see his cardiologist in the [place] system later today.  Current Outpatient Medications  Medication Sig Dispense Refill  . apixaban (ELIQUIS) 5 MG TABS tablet Take 5 mg by mouth 2 (two) times daily.    [name] aspirin EC 81 MG EC tablet Take 1 tablet (81 mg total) by mouth daily. Swallow whole. 30 tablet 11  . atorvastatin (LIPITOR) 40 MG tablet Take 40 mg by mouth at bedtime. Reported on 01/11/2016    . Blood Glucose Monitoring Suppl (ONETOUCH VERIO FLEX SYSTEM) W/DEVICE KIT 1 each by Does not apply route daily. Dx: E11.9 1 kit 0  . carvedilol (COREG) 12.5 MG tablet Take 1 tablet (12.5 mg total) by mouth 2 (two) times daily with a meal. 60 tablet 1  . cholecalciferol (VITAMIN D3) 25 MCG (1000 UNIT) tablet Take 1,000 Units by mouth daily.    . Cyanocobalamin (VITAMIN B12) 500 MCG TABS Take by mouth. Per patient taking 1 tablet twice a day    . ferrous sulfate 325 (65 FE) MG tablet Take 1 tablet by mouth 3 (three) times a week.    . furosemide (LASIX) 20 MG tablet Take 3 tablets (60 mg total) by mouth in the AM, then take 2 tabs (40 mg total) by mouth in the PM for 3 days only, then go back to taking 2 tabs (40 mg total) by mouth daily thereafter. 67 tablet 0  . glipiZIDE (GLUCOTROL) 10 MG tablet Take 10 mg by mouth 2 (two) times daily before a meal.    . glucose blood (ONETOUCH VERIO) test strip Use to test blood sugar 2-3 times daily as instructed. Dx: E11.9 200 each 3  . levothyroxine (SYNTHROID) 112 MCG tablet Take 224 mcg by mouth daily before breakfast.    . losartan (COZAAR) 50 MG tablet Take 1 tablet (50 mg total) by mouth daily. 90 tablet 2  . metFORMIN (GLUCOPHAGE-XR) 500 MG 24 hr tablet Take 500 mg by mouth daily with breakfast.    . Multiple Vitamins-Minerals  (MULTIVITAMIN WITH MINERALS) tablet Take 1 tablet by mouth daily.    . Multiple Vitamins-Minerals (PRESERVISION AREDS 2+MULTI VIT PO) Take 1 capsule by mouth in the morning and at bedtime.    . nitroGLYCERIN (NITROSTAT) 0.4 MG SL tablet DISSOLVE ONE TABLET UNDER THE TONGUE EVERY 5 MINUTES AS NEEDED FOR CHEST PAIN (REPEAT EVERY 5 MINUTES IF NEEDED FOR A TOTAL OF 3 TABLETS IN 15 MINUTES. IF NO RELIEF, CALL 911.)    . omeprazole (PRILOSEC) 20 MG capsule Take 1 capsule by mouth daily.    [name] DELICA LANCETS FINE MISC Use to test blood sugar 2-3 times daily as instructed. Dx: E11.9 200 each 3  . VENTOLIN HFA 108 (90 Base) MCG/ACT inhaler Inhale 1-2 puffs into the lungs every 4 (four) hours as needed for wheezing or shortness of breath.     [name] acetaminophen (TYLENOL) 325 MG tablet Take 2 tablets (650 mg total) by mouth every 4 (four) hours as needed for headache or mild pain. (Patient not taking: Reported on 11/01/2020)    . potassium chloride SA (KLOR-CON) 20 MEQ tablet Take 1 tablet (20 mEq total) by mouth 2 (two) times daily. (Patient not taking: Reported on 11/01/2020) 180 tablet 0  .  traMADol (ULTRAM) 50 MG tablet Take 1 tablet (50 mg total) by mouth every 4 (four) hours as needed for moderate pain. (Patient not taking: Reported on 11/01/2020) 30 tablet 0   No current facility-administered medications for this visit.     Physical Exam: BP (!) 153/69 (BP Location: Right Arm, Patient Position: Sitting)   Pulse 74   Resp 20   Ht 5' 10" (1.778 m)   Wt 241 lb (109.3 kg)   SpO2 100% Comment: RA  BMI 34.58 kg/m  He looks well. Cardiac exam shows a regular rate and rhythm with normal heart sounds. Lungs are clear. The chest incision is healing well and the sternum is stable. His leg incision is healing well and there is no peripheral edema.  Diagnostic Tests:  Narrative & Impression  CLINICAL DATA:  Thoracentesis 1 week ago.  EXAM: CHEST - 2 VIEW  COMPARISON:  10/25/2020.   10/19/2020  FINDINGS: Previous median sternotomy, CABG, aortic valve replacement and atrial clip. The right chest is clear. The left, mild blunting the costophrenic angle is consistent with presence of a small effusion. This appears very similar to the study of 1 week ago and is certainly improved compared to the study of 10/19/2020. No worsening or new finding.  IMPRESSION: Very small left pleural effusion, similar to the study of 1 week ago.   Electronically Signed   By: Nelson Chimes M.D.   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  His postoperative left pleural effusion has been completely drained and his follow-up chest x-ray 1 week later shows no reaccumulation of the pleural effusion.  I encouraged him to continue ambulating as much as possible and to use his incentive spirometer.  I asked him not to lift anything heavier than 10 pounds for 3 months postoperatively.  Plan:  He is going to follow-up with his cardiologist in the [place] [facility] system and will return to see me if he has any problems with his incisions.   Gaye Pollack, MD [facility] [phone]

## 2020-11-04 ENCOUNTER — Other Ambulatory Visit: Payer: Self-pay

## 2020-11-04 ENCOUNTER — Emergency Department (HOSPITAL_COMMUNITY): Payer: No Typology Code available for payment source

## 2020-11-04 ENCOUNTER — Emergency Department (HOSPITAL_COMMUNITY)
Admission: EM | Admit: 2020-11-04 | Discharge: 2020-11-04 | Disposition: A | Discharge status: Home / Self Care | Payer: No Typology Code available for payment source | Source: Home / Self Care | Attending: Emergency Medicine | Admitting: Emergency Medicine

## 2020-11-04 ENCOUNTER — Encounter (HOSPITAL_COMMUNITY): Payer: Self-pay

## 2020-11-04 DIAGNOSIS — Z96652 Presence of left artificial knee joint: Secondary | ICD-10-CM | POA: Insufficient documentation

## 2020-11-04 DIAGNOSIS — R0602 Shortness of breath: Secondary | ICD-10-CM

## 2020-11-04 DIAGNOSIS — I2510 Atherosclerotic heart disease of native coronary artery without angina pectoris: Secondary | ICD-10-CM | POA: Insufficient documentation

## 2020-11-04 DIAGNOSIS — I10 Essential (primary) hypertension: Secondary | ICD-10-CM | POA: Insufficient documentation

## 2020-11-04 DIAGNOSIS — Z951 Presence of aortocoronary bypass graft: Secondary | ICD-10-CM | POA: Insufficient documentation

## 2020-11-04 DIAGNOSIS — E119 Type 2 diabetes mellitus without complications: Secondary | ICD-10-CM | POA: Insufficient documentation

## 2020-11-04 DIAGNOSIS — Z20822 Contact with and (suspected) exposure to covid-19: Secondary | ICD-10-CM | POA: Insufficient documentation

## 2020-11-04 DIAGNOSIS — Z96651 Presence of right artificial knee joint: Secondary | ICD-10-CM | POA: Insufficient documentation

## 2020-11-04 DIAGNOSIS — F17210 Nicotine dependence, cigarettes, uncomplicated: Secondary | ICD-10-CM | POA: Insufficient documentation

## 2020-11-04 DIAGNOSIS — E039 Hypothyroidism, unspecified: Secondary | ICD-10-CM | POA: Insufficient documentation

## 2020-11-04 LAB — URINALYSIS, ROUTINE W REFLEX MICROSCOPIC
Bilirubin Urine: NEGATIVE
Glucose, UA: NEGATIVE mg/dL
Hgb urine dipstick: NEGATIVE
Ketones, ur: NEGATIVE mg/dL
Leukocytes,Ua: NEGATIVE
Nitrite: NEGATIVE
Protein, ur: 30 mg/dL — AB
Specific Gravity, Urine: 1.019 (ref 1.005–1.030)
pH: 6 (ref 5.0–8.0)

## 2020-11-04 LAB — TROPONIN I (HIGH SENSITIVITY)
Troponin I (High Sensitivity): 23 ng/L — ABNORMAL HIGH (ref <18)
Troponin I (High Sensitivity): 27 ng/L — ABNORMAL HIGH (ref <18)

## 2020-11-04 LAB — LACTIC ACID, PLASMA
Lactic Acid, Venous: 1 mmol/L (ref 0.5–1.9)
Lactic Acid, Venous: 1.1 mmol/L (ref 0.5–1.9)

## 2020-11-04 LAB — COMPREHENSIVE METABOLIC PANEL
ALT: 11 U/L (ref 0–44)
AST: 13 U/L — ABNORMAL LOW (ref 15–41)
Albumin: 3.2 g/dL — ABNORMAL LOW (ref 3.5–5.0)
Alkaline Phosphatase: 77 U/L (ref 38–126)
Anion gap: 8 (ref 5–15)
BUN: 14 mg/dL (ref 8–23)
CO2: 26 mmol/L (ref 22–32)
Calcium: 8.2 mg/dL — ABNORMAL LOW (ref 8.9–10.3)
Chloride: 98 mmol/L (ref 98–111)
Creatinine, Ser: 0.72 mg/dL (ref 0.61–1.24)
GFR, Estimated: >60 mL/min (ref >60)
Glucose, Bld: 78 mg/dL (ref 70–99)
Potassium: 3.5 mmol/L (ref 3.5–5.1)
Sodium: 132 mmol/L — ABNORMAL LOW (ref 135–145)
Total Bilirubin: 1 mg/dL (ref 0.3–1.2)
Total Protein: 6 g/dL — ABNORMAL LOW (ref 6.5–8.1)

## 2020-11-04 LAB — CBC WITH DIFFERENTIAL/PLATELET
Abs Immature Granulocytes: 0.02 10*3/uL (ref 0.00–0.07)
Basophils Absolute: 0 10*3/uL (ref 0.0–0.1)
Basophils Relative: 0 %
Eosinophils Absolute: 0.2 10*3/uL (ref 0.0–0.5)
Eosinophils Relative: 3 %
HCT: 25.5 % — ABNORMAL LOW (ref 39.0–52.0)
Hemoglobin: 7.6 g/dL — ABNORMAL LOW (ref 13.0–17.0)
Immature Granulocytes: 0 %
Lymphocytes Relative: 12 %
Lymphs Abs: 0.9 10*3/uL (ref 0.7–4.0)
MCH: 22 pg — ABNORMAL LOW (ref 26.0–34.0)
MCHC: 29.8 g/dL — ABNORMAL LOW (ref 30.0–36.0)
MCV: 73.9 fL — ABNORMAL LOW (ref 80.0–100.0)
Monocytes Absolute: 0.7 10*3/uL (ref 0.1–1.0)
Monocytes Relative: 9 %
Neutro Abs: 5.9 10*3/uL (ref 1.7–7.7)
Neutrophils Relative %: 76 %
Platelets: 192 10*3/uL (ref 150–400)
RBC: 3.45 MIL/uL — ABNORMAL LOW (ref 4.22–5.81)
RDW: 17.8 % — ABNORMAL HIGH (ref 11.5–15.5)
WBC: 7.8 10*3/uL (ref 4.0–10.5)
nRBC: 0 % (ref 0.0–0.2)

## 2020-11-04 LAB — RESP PANEL BY RT-PCR (FLU A&B, COVID) ARPGX2
Influenza A by PCR: NEGATIVE
Influenza B by PCR: NEGATIVE
SARS Coronavirus 2 by RT PCR: NEGATIVE

## 2020-11-04 LAB — BRAIN NATRIURETIC PEPTIDE: B Natriuretic Peptide: 704 pg/mL — ABNORMAL HIGH (ref 0.0–100.0)

## 2020-11-04 MED ORDER — ACETAMINOPHEN 500 MG PO TABS
1000.0000 mg | ORAL_TABLET | Freq: Once | ORAL | Status: AC
  Administered 2020-11-04: 1000 mg via ORAL
  Filled 2020-11-04: qty 2

## 2020-11-04 NOTE — ED Triage Notes (Signed)
Pt to er via ems, per ems pt has some dental work done recently and presents today for some sob.  Pt states that he had a dental extraction on Thursday, states that he started having a cough today and also started having a fever.

## 2020-11-04 NOTE — ED Notes (Signed)
Patient maintained oxygen sats >95% while ambulating on room air. EDP notified.

## 2020-11-04 NOTE — Discharge Instructions (Signed)
You were seen in the emerge department today with trouble breathing.  Your temperature was slightly elevated here but we did not find any active infection.  I did send blood and urine cultures and if bacteria grow in these you will be called and asked to return to the emergency department for treatment.  If you develop worsening shortness of breath or develop chest pain, abdominal pain, other high fevers you should return to the emergency department for reevaluation.

## 2020-11-04 NOTE — ED Provider Notes (Signed)
Emergency Department Provider Note   I have reviewed the triage vital signs and the nursing notes.   HISTORY  Chief Complaint Shortness of Breath   HPI Mason Patton is a 75 y.o. male with PMH of CAD, AS s/p repair in March 2022, emphysema, OSA with intermittent CPAP at home and recent thoracentesis on 4/20 with left sided pleural effusion with IR presents to the ED with shortness of breath.  He states he had a dental extraction done last week and by patient's report was on prophylactic antibiotics for this procedure.  He has been compliant with his anticoagulation but has developed some intermittent shortness of breath.  The symptoms are sporadic and not clearly reproducible.  He is not having chest pain/pressure/tightness.  He is not having vomiting.  He was not aware of fever but in triage has a temp of 100.3 F.  He did take Tylenol yesterday but not for subjective fever but instead for mild dental pain.  He is not having any abdominal pain, diarrhea, vomiting.  No dysuria, hesitancy, urgency. No significant LE swelling.   Past Medical History:  Diagnosis Date  . Aortic stenosis    09/30/19 echo ([facility], Cardiologist Dr. Clovis Riley): Mild-moderate AS, MaxVel 308 cm/s, MeanPG 22 mmHg, AVA 1.6 cm2  . Arthritis   . Atrial fibrillation (HCC)   . Carotid artery stenosis    11/03/19 [place] ([facility]): 50-69% RICA stenosis  . Cholecystitis with cholelithiasis   . Eczema   . Emphysema of lung (HCC)   . Gallstones and inflammation of gallbladder without obstruction   . Heart murmur   . History of nuclear stress test 02/23/2010   dipyridamole; normal pattern of perfusion in all regions; normal, low risk study   . Hyperlipidemia   . Hypertension   . 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 Meningoencephalitis   . Obesity   . OSA on CPAP    last sleep study >8 yrs  . Pneumonia    "twice" (05/19/2013)  . Shortness of breath   . Stroke [facility])     "they said I had a minor one when I had menigitis" (05/19/2013)  . Thyroid disease   . Tobacco abuse    quit 11/08/2012  . Type II diabetes mellitus (HCC)   . Umbilical hernia     Patient Active Problem List   Diagnosis Date Noted  . Coronary artery disease 09/11/2020  . Aortic stenosis, severe 09/07/2020  . Unstable angina (HCC) 09/06/2020  . Arthritis of right knee 11/24/2019  . Unilateral primary osteoarthritis, right knee   . Uncontrolled type 2 diabetes mellitus with peripheral neuropathy (HCC) 03/27/2015  . 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 CVA (cerebral infarction) 11/16/2013  . Dizziness 11/15/2013  . Left sided lacunar infarction (HCC) 11/15/2013  . Lacunar infarction (HCC) 06/01/2013  . Acute, but ill-defined, cerebrovascular disease 06/01/2013  . 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 Acute CVA (cerebrovascular accident) (HCC) 03/29/2013  . Numbness and tingling 03/27/2013  . Hyperglycemia 03/26/2013  . Meningitis 03/25/2013  . Encephalopathy acute 03/23/2013  . Fever 03/23/2013  . OSA on CPAP 03/23/2013  . Obesity 03/23/2013  . Murmur 03/16/2013  . Preoperative cardiovascular examination 03/16/2013  . Dyslipidemia 03/16/2013  . Hypertension   . Cholecystitis with cholelithiasis   . Apnea, sleep 01/23/2010    Past Surgical History:  Procedure Laterality Date  . AORTIC VALVE REPLACEMENT N/A 09/11/2020   Procedure: AORTIC VALVE REPLACEMENT (AVR);  Surgeon: Alleen Borne, MD;  Location:  [facility] OR;  Service: Open Heart Surgery;  Laterality: N/A;  . CHOLECYSTECTOMY  09/09/2011   Procedure: LAPAROSCOPIC CHOLECYSTECTOMY WITH INTRAOPERATIVE CHOLANGIOGRAM;  Surgeon: Jetty Duhamel, MD;  Location: [facility] OR;  Service: General;  Laterality: N/A;  . CLIPPING OF ATRIAL APPENDAGE N/A 09/11/2020   Procedure: CLIPPING OF ATRIAL APPENDAGE;  Surgeon: Alleen Borne, MD;  Location: [facility] OR;  Service: Open Heart Surgery;  Laterality: N/A;  . CORONARY ARTERY BYPASS GRAFT N/A 09/11/2020    Procedure: CORONARY ARTERY BYPASS GRAFTING (CABG), ON PUMP, TIMES 2 , USING LEFT INTERNAL MAMMARY ARTERY AND ENDOSCOPICALLY HARVESTED RIGHT GREATER SAPHENOUS VEIN;  Surgeon: Alleen Borne, MD;  Location: [facility] OR;  Service: Open Heart Surgery;  Laterality: N/A;  . HERNIA REPAIR  04/2006   UHR  . IR THORACENTESIS ASP PLEURAL SPACE W/IMG GUIDE  10/25/2020  . JOINT REPLACEMENT    . KNEE ARTHROSCOPY Right    "w2 times" (05/19/2013)  . RIGHT/LEFT HEART CATH AND CORONARY ANGIOGRAPHY N/A 09/07/2020   Procedure: RIGHT/LEFT HEART CATH AND CORONARY ANGIOGRAPHY;  Surgeon: Runell Gess, MD;  Location: [facility] INVASIVE CV LAB;  Service: Cardiovascular;  Laterality: N/A;  . TEE WITHOUT CARDIOVERSION N/A 09/11/2020   Procedure: TRANSESOPHAGEAL ECHOCARDIOGRAM (TEE);  Surgeon: Alleen Borne, MD;  Location: [facility] OR;  Service: Open Heart Surgery;  Laterality: N/A;  . TONSILLECTOMY    . TOTAL KNEE ARTHROPLASTY Left 05/19/2013  . TOTAL KNEE ARTHROPLASTY Left 05/19/2013   Procedure: TOTAL KNEE ARTHROPLASTY;  Surgeon: Nadara Mustard, MD;  Location: [facility] OR;  Service: Orthopedics;  Laterality: Left;  Left Total Knee Arthroplasty  . TOTAL KNEE ARTHROPLASTY Right 11/24/2019  . TOTAL KNEE ARTHROPLASTY Right 11/24/2019   Procedure: RIGHT TOTAL KNEE ARTHROPLASTY;  Surgeon: Nadara Mustard, MD;  Location[facility];  Service: Orthopedics;  Laterality: Right;  . TRANSTHORACIC ECHOCARDIOGRAM  03/28/2005   EF=>55%, mod conc LVH; LA mod dilated & borderline RA enlargement; mild TR; mild pulm valve regurg    Allergies Codeine and Demerol  Family History  Problem Relation Age of Onset  . Cancer Maternal Aunt        colon  . Cancer Maternal Aunt        lung  . Thyroid disease Mother   . Hyperlipidemia Father   . Diabetes Maternal Grandmother   . Asthma Child   . Diabetes type I Child     Social History Social History   Tobacco Use  . Smoking status: Current Every Day Smoker    Packs/day: 0.50    Years: 50.00    Pack  years: 25.00    Types: Cigarettes  . Smokeless tobacco: Never Used  Vaping Use  . Vaping Use: Never used  Substance Use Topics  . Alcohol use: No  . Drug use: No    Review of Systems  Constitutional: No fever/chills although borderline temp here.  Eyes: No visual changes. ENT: No sore throat. Positive dental pain.  Cardiovascular: Denies chest pain. Respiratory: Positive shortness of breath. Gastrointestinal: No abdominal pain.  No nausea, no vomiting.  No diarrhea.  No constipation. Genitourinary: Negative for dysuria. Musculoskeletal: Negative for back pain. Skin: Negative for rash. Neurological: Negative for headaches, focal weakness or numbness.  10-point ROS otherwise negative.  ____________________________________________   PHYSICAL EXAM:  VITAL SIGNS: ED Triage Vitals [11/04/20 1816]  Enc Vitals Group     BP (!) 118/58     Pulse Rate 82     Resp (!) 21     Temp  100.3 F (37.9 C)     Temp Source Oral     SpO2 93 %     Weight 240 lb (108.9 kg)     Height 5\' 10"  (1.778 m)   Constitutional: Alert and oriented. Well appearing and in no acute distress. Eyes: Conjunctivae are normal. Head: Atraumatic. Nose: No congestion/rhinnorhea. Mouth/Throat: Mucous membranes are moist.  Oropharynx non-erythematous.  Well-appearing area of dental extraction in the right lower jaw. No active bleeding. No surrounding edema.  Neck: No stridor.   Cardiovascular: Normal rate, regular rhythm. Good peripheral circulation. Grossly normal heart sounds.  No loud murmurs.  Respiratory: Normal respiratory effort.  No retractions. Lungs CTAB. No wheezing.  Gastrointestinal: Soft and nontender. No distention.  Musculoskeletal: No lower extremity tenderness with trace bilateral LE edema. No gross deformities of extremities. Neurologic:  Normal speech and language.  Skin:  Skin is warm, dry and intact. No rash noted.  ____________________________________________   LABS (all labs ordered  are listed, but only abnormal results are displayed)  Labs Reviewed  COMPREHENSIVE METABOLIC PANEL - Abnormal; Notable for the following components:      Result Value   Sodium 132 (*)    Calcium 8.2 (*)    Total Protein 6.0 (*)    Albumin 3.2 (*)    AST 13 (*)    All other components within normal limits  CBC WITH DIFFERENTIAL/PLATELET - Abnormal; Notable for the following components:   RBC 3.45 (*)    Hemoglobin 7.6 (*)    HCT 25.5 (*)    MCV 73.9 (*)    MCH 22.0 (*)    MCHC 29.8 (*)    RDW 17.8 (*)    All other components within normal limits  BRAIN NATRIURETIC PEPTIDE - Abnormal; Notable for the following components:   B Natriuretic Peptide 704.0 (*)    All other components within normal limits  URINALYSIS, ROUTINE W REFLEX MICROSCOPIC - Abnormal; Notable for the following components:   Protein, ur 30 (*)    Bacteria, UA RARE (*)    All other components within normal limits  TROPONIN I (HIGH SENSITIVITY) - Abnormal; Notable for the following components:   Troponin I (High Sensitivity) 23 (*)    All other components within normal limits  TROPONIN I (HIGH SENSITIVITY) - Abnormal; Notable for the following components:   Troponin I (High Sensitivity) 27 (*)    All other components within normal limits  CULTURE, BLOOD (ROUTINE X 2)  CULTURE, BLOOD (ROUTINE X 2)  RESP PANEL BY RT-PCR (FLU A&B, COVID) ARPGX2  LACTIC ACID, PLASMA  LACTIC ACID, PLASMA   ____________________________________________  EKG   EKG Interpretation  Date/Time:  Saturday November 04 2020 18:40:15 EDT Ventricular Rate:  73 PR Interval:  221 QRS Duration: 117 QT Interval:  407 QTC Calculation: 449 R Axis:   -43 Text Interpretation: Sinus rhythm Multiple premature complexes, vent & supraven Nonspecific IVCD with LAD Anteroseptal infarct, old Confirmed by [id number] [phone]) on 11/04/2020 6:48:24 PM       ____________________________________________  RADIOLOGY  DG Chest Portable 1 View  Result  Date: 11/04/2020 CLINICAL DATA:  Fever, short of breath EXAM: PORTABLE CHEST 1 VIEW COMPARISON:  11/01/2020 FINDINGS: Single frontal view of the chest demonstrates stable postsurgical changes from median sternotomy and aortic valve prosthesis. Cardiac silhouette is stable. No acute airspace disease, effusion, or pneumothorax. No acute bony abnormalities. IMPRESSION: 1. No acute intrathoracic process. Electronically Signed   By: 11/03/2020 M.D.   On: 11/04/2020 19:14  ____________________________________________   PROCEDURES  Procedure(s) performed:   Procedures  None  ____________________________________________   INITIAL IMPRESSION / ASSESSMENT AND PLAN / ED COURSE  Pertinent labs & imaging results that were available during my care of the patient were reviewed by me and considered in my medical decision making (see chart for details).   Patient presents to the emergency department with shortness of breath primarily.  He has borderline fever here in the setting of recent dental extraction.  He is not having severe dental pain, jaw swelling, obvious infection.  The wound is hemostatic.  I do not hear any obvious murmurs or signs of new congestive failure on exam.  Lungs are relatively clear with no wheezing.  He did have a recent thoracentesis with no immediate complication.  Plan for chest x-ray to evaluate for pneumonia.  We will test for COVID and flu.  Plan for labs including troponin although CAD much less likely.  Bacterial endocarditis is very unlikely in this case.  It sounds as if the patient had been taking antibiotics before his dental extraction and his vitals, other than borderline fever, and exam are not consistent with this diagnosis.  09:00 PM  Patient's lab work reviewed.  UA shows no evidence of infection.  Lactic acid is normal.  No acute kidney injury.  No leukocytosis.  I have sent blood cultures.  Patient has been up and ambulatory in the emergency department on  pulse ox with no hypoxemia or increased work of breathing.  He does have some anemia but that is near his baseline and review of prior labs.  BNP is slightly elevated although clinically patient does not appear volume overloaded.  Initial troponin is 23 and will follow the second lab but also in line with prior values.  Ultimately, my suspicion for endocarditis is very low.  We will follow blood cultures.  His wife is now at bedside and states he is still taking antibiotics after his dental procedure.   Second troponin not significantly changed and in line with other values from prior ED visits.  Patient continues to feel well and reports wanting to go home.  Strict ED return precautions discussed as above and provided in writing as well. ____________________________________________  FINAL CLINICAL IMPRESSION(S) / ED DIAGNOSES  Final diagnoses:  Shortness of breath    MEDICATIONS GIVEN DURING THIS VISIT:  Medications  acetaminophen (TYLENOL) tablet 1,000 mg (1,000 mg Oral Given 11/04/20 1909)    Note:  This document was prepared using Dragon voice recognition software and may include unintentional dictation errors.  Alona Bene, MD, [facility] Emergency Medicine    Dinh Ayotte, Arlyss Repress, MD 11/04/20 2124

## 2020-11-05 ENCOUNTER — Other Ambulatory Visit: Payer: Self-pay

## 2020-11-05 ENCOUNTER — Inpatient Hospital Stay (HOSPITAL_COMMUNITY)
Admission: EM | Admit: 2020-11-05 | Discharge: 2020-11-07 | DRG: 280 | Disposition: A | Discharge status: Home Health | Payer: No Typology Code available for payment source | Attending: Internal Medicine | Admitting: Internal Medicine

## 2020-11-05 ENCOUNTER — Encounter (HOSPITAL_COMMUNITY): Payer: Self-pay

## 2020-11-05 ENCOUNTER — Emergency Department (HOSPITAL_COMMUNITY): Payer: No Typology Code available for payment source

## 2020-11-05 DIAGNOSIS — R509 Fever, unspecified: Secondary | ICD-10-CM

## 2020-11-05 DIAGNOSIS — E785 Hyperlipidemia, unspecified: Secondary | ICD-10-CM | POA: Diagnosis present

## 2020-11-05 DIAGNOSIS — I2510 Atherosclerotic heart disease of native coronary artery without angina pectoris: Secondary | ICD-10-CM | POA: Diagnosis present

## 2020-11-05 DIAGNOSIS — E876 Hypokalemia: Secondary | ICD-10-CM | POA: Diagnosis present

## 2020-11-05 DIAGNOSIS — Z8673 Personal history of transient ischemic attack (TIA), and cerebral infarction without residual deficits: Secondary | ICD-10-CM

## 2020-11-05 DIAGNOSIS — I110 Hypertensive heart disease with heart failure: Secondary | ICD-10-CM | POA: Diagnosis present

## 2020-11-05 DIAGNOSIS — J440 Chronic obstructive pulmonary disease with acute lower respiratory infection: Secondary | ICD-10-CM | POA: Diagnosis present

## 2020-11-05 DIAGNOSIS — Z83438 Family history of other disorder of lipoprotein metabolism and other lipidemia: Secondary | ICD-10-CM

## 2020-11-05 DIAGNOSIS — Z885 Allergy status to narcotic agent status: Secondary | ICD-10-CM

## 2020-11-05 DIAGNOSIS — Z7984 Long term (current) use of oral hypoglycemic drugs: Secondary | ICD-10-CM

## 2020-11-05 DIAGNOSIS — Z6835 Body mass index (BMI) 35.0-35.9, adult: Secondary | ICD-10-CM

## 2020-11-05 DIAGNOSIS — Z7982 Long term (current) use of aspirin: Secondary | ICD-10-CM

## 2020-11-05 DIAGNOSIS — Z20822 Contact with and (suspected) exposure to covid-19: Secondary | ICD-10-CM | POA: Diagnosis present

## 2020-11-05 DIAGNOSIS — E0590 Thyrotoxicosis, unspecified without thyrotoxic crisis or storm: Secondary | ICD-10-CM | POA: Diagnosis present

## 2020-11-05 DIAGNOSIS — J441 Chronic obstructive pulmonary disease with (acute) exacerbation: Secondary | ICD-10-CM | POA: Diagnosis present

## 2020-11-05 DIAGNOSIS — D509 Iron deficiency anemia, unspecified: Secondary | ICD-10-CM | POA: Diagnosis present

## 2020-11-05 DIAGNOSIS — I4819 Other persistent atrial fibrillation: Secondary | ICD-10-CM | POA: Diagnosis present

## 2020-11-05 DIAGNOSIS — Z825 Family history of asthma and other chronic lower respiratory diseases: Secondary | ICD-10-CM

## 2020-11-05 DIAGNOSIS — Z79899 Other long term (current) drug therapy: Secondary | ICD-10-CM

## 2020-11-05 DIAGNOSIS — E669 Obesity, unspecified: Secondary | ICD-10-CM | POA: Diagnosis present

## 2020-11-05 DIAGNOSIS — E039 Hypothyroidism, unspecified: Secondary | ICD-10-CM | POA: Diagnosis present

## 2020-11-05 DIAGNOSIS — J189 Pneumonia, unspecified organism: Secondary | ICD-10-CM | POA: Diagnosis present

## 2020-11-05 DIAGNOSIS — I214 Non-ST elevation (NSTEMI) myocardial infarction: Principal | ICD-10-CM | POA: Diagnosis present

## 2020-11-05 DIAGNOSIS — Z952 Presence of prosthetic heart valve: Secondary | ICD-10-CM

## 2020-11-05 DIAGNOSIS — Z96653 Presence of artificial knee joint, bilateral: Secondary | ICD-10-CM | POA: Diagnosis present

## 2020-11-05 DIAGNOSIS — I5043 Acute on chronic combined systolic (congestive) and diastolic (congestive) heart failure: Secondary | ICD-10-CM | POA: Diagnosis present

## 2020-11-05 DIAGNOSIS — G4733 Obstructive sleep apnea (adult) (pediatric): Secondary | ICD-10-CM | POA: Diagnosis present

## 2020-11-05 DIAGNOSIS — R0600 Dyspnea, unspecified: Secondary | ICD-10-CM | POA: Diagnosis present

## 2020-11-05 DIAGNOSIS — I509 Heart failure, unspecified: Secondary | ICD-10-CM

## 2020-11-05 DIAGNOSIS — E119 Type 2 diabetes mellitus without complications: Secondary | ICD-10-CM | POA: Diagnosis present

## 2020-11-05 DIAGNOSIS — F17210 Nicotine dependence, cigarettes, uncomplicated: Secondary | ICD-10-CM | POA: Diagnosis present

## 2020-11-05 DIAGNOSIS — Z951 Presence of aortocoronary bypass graft: Secondary | ICD-10-CM

## 2020-11-05 DIAGNOSIS — Z79890 Hormone replacement therapy (postmenopausal): Secondary | ICD-10-CM

## 2020-11-05 DIAGNOSIS — I350 Nonrheumatic aortic (valve) stenosis: Secondary | ICD-10-CM | POA: Diagnosis present

## 2020-11-05 DIAGNOSIS — Z7901 Long term (current) use of anticoagulants: Secondary | ICD-10-CM

## 2020-11-05 DIAGNOSIS — Z833 Family history of diabetes mellitus: Secondary | ICD-10-CM

## 2020-11-05 LAB — RESPIRATORY PANEL BY PCR

## 2020-11-05 LAB — COMPREHENSIVE METABOLIC PANEL
ALT: 11 U/L (ref 0–44)
AST: 16 U/L (ref 15–41)
Albumin: 3.1 g/dL — ABNORMAL LOW (ref 3.5–5.0)
Alkaline Phosphatase: 71 U/L (ref 38–126)
Anion gap: 11 (ref 5–15)
BUN: 13 mg/dL (ref 8–23)
CO2: 26 mmol/L (ref 22–32)
Calcium: 8.6 mg/dL — ABNORMAL LOW (ref 8.9–10.3)
Chloride: 97 mmol/L — ABNORMAL LOW (ref 98–111)
Creatinine, Ser: 0.9 mg/dL (ref 0.61–1.24)
GFR, Estimated: >60 mL/min (ref >60)
Glucose, Bld: 136 mg/dL — ABNORMAL HIGH (ref 70–99)
Potassium: 3.8 mmol/L (ref 3.5–5.1)
Sodium: 134 mmol/L — ABNORMAL LOW (ref 135–145)
Total Bilirubin: 0.7 mg/dL (ref 0.3–1.2)
Total Protein: 5.9 g/dL — ABNORMAL LOW (ref 6.5–8.1)

## 2020-11-05 LAB — CBC WITH DIFFERENTIAL/PLATELET
Abs Immature Granulocytes: 0.04 10*3/uL (ref 0.00–0.07)
Basophils Absolute: 0 10*3/uL (ref 0.0–0.1)
Basophils Relative: 0 %
Eosinophils Absolute: 0.2 10*3/uL (ref 0.0–0.5)
Eosinophils Relative: 3 %
HCT: 26.5 % — ABNORMAL LOW (ref 39.0–52.0)
Hemoglobin: 7.7 g/dL — ABNORMAL LOW (ref 13.0–17.0)
Immature Granulocytes: 1 %
Lymphocytes Relative: 11 %
Lymphs Abs: 0.9 10*3/uL (ref 0.7–4.0)
MCH: 21.4 pg — ABNORMAL LOW (ref 26.0–34.0)
MCHC: 29.1 g/dL — ABNORMAL LOW (ref 30.0–36.0)
MCV: 73.8 fL — ABNORMAL LOW (ref 80.0–100.0)
Monocytes Absolute: 0.7 10*3/uL (ref 0.1–1.0)
Monocytes Relative: 9 %
Neutro Abs: 6.1 10*3/uL (ref 1.7–7.7)
Neutrophils Relative %: 76 %
Platelets: 208 10*3/uL (ref 150–400)
RBC: 3.59 MIL/uL — ABNORMAL LOW (ref 4.22–5.81)
RDW: 17.7 % — ABNORMAL HIGH (ref 11.5–15.5)
WBC: 7.9 10*3/uL (ref 4.0–10.5)
nRBC: 0 % (ref 0.0–0.2)

## 2020-11-05 LAB — LACTIC ACID, PLASMA
Lactic Acid, Venous: 2.5 mmol/L (ref 0.5–1.9)
Lactic Acid, Venous: 1.7 mmol/L (ref 0.5–1.9)

## 2020-11-05 LAB — BRAIN NATRIURETIC PEPTIDE: B Natriuretic Peptide: 988.4 pg/mL — ABNORMAL HIGH (ref 0.0–100.0)

## 2020-11-05 LAB — TSH: TSH: 0.185 u[IU]/mL — ABNORMAL LOW (ref 0.350–4.500)

## 2020-11-05 LAB — URINALYSIS, ROUTINE W REFLEX MICROSCOPIC
Bilirubin Urine: NEGATIVE
Glucose, UA: NEGATIVE mg/dL
Hgb urine dipstick: NEGATIVE
Ketones, ur: NEGATIVE mg/dL
Leukocytes,Ua: NEGATIVE
Nitrite: NEGATIVE
Protein, ur: NEGATIVE mg/dL
Specific Gravity, Urine: 1.01 (ref 1.005–1.030)
pH: 5.5 (ref 5.0–8.0)

## 2020-11-05 LAB — GLUCOSE, CAPILLARY
Glucose-Capillary: 96 mg/dL (ref 70–99)
Glucose-Capillary: 135 mg/dL — ABNORMAL HIGH (ref 70–99)

## 2020-11-05 LAB — I-STAT CHEM 8, ED
BUN: 14 mg/dL (ref 8–23)
Calcium, Ion: 1.13 mmol/L — ABNORMAL LOW (ref 1.15–1.40)
Chloride: 97 mmol/L — ABNORMAL LOW (ref 98–111)
Creatinine, Ser: 0.8 mg/dL (ref 0.61–1.24)
Glucose, Bld: 134 mg/dL — ABNORMAL HIGH (ref 70–99)
HCT: 25 % — ABNORMAL LOW (ref 39.0–52.0)
Hemoglobin: 8.5 g/dL — ABNORMAL LOW (ref 13.0–17.0)
Potassium: 3.9 mmol/L (ref 3.5–5.1)
Sodium: 134 mmol/L — ABNORMAL LOW (ref 135–145)
TCO2: 26 mmol/L (ref 22–32)

## 2020-11-05 LAB — RESP PANEL BY RT-PCR (FLU A&B, COVID) ARPGX2
Influenza A by PCR: NEGATIVE
Influenza B by PCR: NEGATIVE
SARS Coronavirus 2 by RT PCR: NEGATIVE

## 2020-11-05 LAB — TROPONIN I (HIGH SENSITIVITY)
Troponin I (High Sensitivity): 90 ng/L — ABNORMAL HIGH (ref <18)
Troponin I (High Sensitivity): 108 ng/L (ref <18)
Troponin I (High Sensitivity): 106 ng/L (ref <18)

## 2020-11-05 LAB — PROTIME-INR
INR: 1.7 — ABNORMAL HIGH (ref 0.8–1.2)
Prothrombin Time: 19.5 seconds — ABNORMAL HIGH (ref 11.4–15.2)

## 2020-11-05 MED ORDER — FUROSEMIDE 10 MG/ML IJ SOLN
40.0000 mg | Freq: Once | INTRAMUSCULAR | Status: AC
  Administered 2020-11-05: 40 mg via INTRAVENOUS
  Filled 2020-11-05: qty 4

## 2020-11-05 MED ORDER — ACETAMINOPHEN 500 MG PO TABS
1000.0000 mg | ORAL_TABLET | Freq: Once | ORAL | Status: AC
  Administered 2020-11-05: 1000 mg via ORAL
  Filled 2020-11-05: qty 2

## 2020-11-05 MED ORDER — ATORVASTATIN CALCIUM 40 MG PO TABS
40.0000 mg | ORAL_TABLET | Freq: Every day | ORAL | Status: DC
  Administered 2020-11-05 – 2020-11-06 (×2): 40 mg via ORAL
  Filled 2020-11-05 (×2): qty 1

## 2020-11-05 MED ORDER — ACETAMINOPHEN 325 MG PO TABS
650.0000 mg | ORAL_TABLET | Freq: Four times a day (QID) | ORAL | Status: DC | PRN
  Administered 2020-11-06 (×2): 650 mg via ORAL
  Filled 2020-11-05 (×2): qty 2

## 2020-11-05 MED ORDER — ASPIRIN EC 81 MG PO TBEC
81.0000 mg | DELAYED_RELEASE_TABLET | Freq: Every day | ORAL | Status: DC
  Administered 2020-11-05 – 2020-11-07 (×3): 81 mg via ORAL
  Filled 2020-11-05 (×3): qty 1

## 2020-11-05 MED ORDER — NITROGLYCERIN 0.4 MG SL SUBL: 0.4000 mg | SUBLINGUAL_TABLET | SUBLINGUAL | Status: DC | PRN

## 2020-11-05 MED ORDER — INSULIN ASPART 100 UNIT/ML IJ SOLN
0.0000 [IU] | Freq: Three times a day (TID) | INTRAMUSCULAR | Status: DC
  Administered 2020-11-06 (×2): 2 [IU] via SUBCUTANEOUS
  Administered 2020-11-07: 3 [IU] via SUBCUTANEOUS

## 2020-11-05 MED ORDER — LEVOTHYROXINE SODIUM 112 MCG PO TABS
224.0000 ug | ORAL_TABLET | Freq: Every day | ORAL | Status: DC
  Administered 2020-11-06: 224 ug via ORAL
  Filled 2020-11-05: qty 2

## 2020-11-05 MED ORDER — PANTOPRAZOLE SODIUM 40 MG PO TBEC
40.0000 mg | DELAYED_RELEASE_TABLET | Freq: Every day | ORAL | Status: DC
  Administered 2020-11-05 – 2020-11-07 (×3): 40 mg via ORAL
  Filled 2020-11-05 (×3): qty 1

## 2020-11-05 MED ORDER — SODIUM CHLORIDE 0.9% FLUSH
3.0000 mL | Freq: Two times a day (BID) | INTRAVENOUS | Status: DC
  Administered 2020-11-05 – 2020-11-07 (×5): 3 mL via INTRAVENOUS

## 2020-11-05 MED ORDER — FERROUS SULFATE 325 (65 FE) MG PO TABS
325.0000 mg | ORAL_TABLET | ORAL | Status: DC
  Administered 2020-11-06: 325 mg via ORAL
  Filled 2020-11-05: qty 1

## 2020-11-05 MED ORDER — APIXABAN 5 MG PO TABS
5.0000 mg | ORAL_TABLET | Freq: Two times a day (BID) | ORAL | Status: DC
  Administered 2020-11-05 – 2020-11-07 (×4): 5 mg via ORAL
  Filled 2020-11-05 (×4): qty 1

## 2020-11-05 MED ORDER — SODIUM CHLORIDE 0.9 % IV SOLN
1.0000 g | Freq: Once | INTRAVENOUS | Status: AC
  Administered 2020-11-05: 1 g via INTRAVENOUS
  Filled 2020-11-05: qty 10

## 2020-11-05 MED ORDER — ACETAMINOPHEN 650 MG RE SUPP: 650.0000 mg | Freq: Four times a day (QID) | RECTAL | Status: DC | PRN

## 2020-11-05 MED ORDER — SODIUM CHLORIDE 0.9 % IV SOLN
500.0000 mg | Freq: Once | INTRAVENOUS | Status: AC
  Administered 2020-11-05: 500 mg via INTRAVENOUS
  Filled 2020-11-05: qty 500

## 2020-11-05 MED ORDER — CYANOCOBALAMIN 500 MCG PO TABS
500.0000 ug | ORAL_TABLET | Freq: Two times a day (BID) | ORAL | Status: DC
  Administered 2020-11-05 – 2020-11-07 (×4): 500 ug via ORAL
  Filled 2020-11-05 (×5): qty 1

## 2020-11-05 MED ORDER — IPRATROPIUM-ALBUTEROL 0.5-2.5 (3) MG/3ML IN SOLN
3.0000 mL | Freq: Four times a day (QID) | RESPIRATORY_TRACT | Status: DC | PRN
  Administered 2020-11-06 – 2020-11-07 (×2): 3 mL via RESPIRATORY_TRACT
  Filled 2020-11-05 (×2): qty 3

## 2020-11-05 MED ORDER — INSULIN ASPART 100 UNIT/ML IJ SOLN: 0.0000 [IU] | Freq: Every day | INTRAMUSCULAR | Status: DC

## 2020-11-05 NOTE — Hospital Course (Signed)
Admitted 11/05/2020  Allergies: Codeine and Demerol Pertinent Hx: CHF, CAD s/p CABG, Afib, OSA, T2DM, COPD  75 y.o. male p/w dyspnea, cough  * CHF exacerbation: BNP 998, questionable medication compliance, eating fast food often. Received IV lasix 40mg  in ED, no O2 requirement. On CPAP at night.   *Low grade fever: No obvious PNA on CXR. VRP neg. Bcx pending. Given Rocephin, azithromycin in ED. Procal pending.  *NSTEMI: Trop 90>108, flattened out. No CP or ECG changes. Likely demand related  Consults: n/a Meds: Lasix, Eliquis VTE ppx: home Eliquis IVF: n/a Diet: HH

## 2020-11-05 NOTE — ED Notes (Signed)
Moved to 3E via gurney with RN and RT escorts on CPAP

## 2020-11-05 NOTE — Plan of Care (Signed)
  Problem: Education: Goal: Knowledge of General Education information will improve Description: Including pain rating scale, medication(s)/side effects and non-pharmacologic comfort measures Outcome: Progressing   Problem: Health Behavior/Discharge Planning: Goal: Ability to manage health-related needs will improve Outcome: Progressing   Problem: Clinical Measurements: Goal: Will remain free from infection Outcome: Progressing Goal: Diagnostic test results will improve Outcome: Progressing Goal: Respiratory complications will improve Outcome: Progressing Goal: Cardiovascular complication will be avoided Outcome: Progressing   Problem: Elimination: Goal: Will not experience complications related to bowel motility Outcome: Progressing Goal: Will not experience complications related to urinary retention Outcome: Progressing   Problem: Pain Managment: Goal: General experience of comfort will improve Outcome: Progressing   Problem: Safety: Goal: Ability to remain free from injury will improve Outcome: Progressing   Problem: Skin Integrity: Goal: Risk for impaired skin integrity will decrease Outcome: Progressing   Problem: Education: Goal: Ability to demonstrate management of disease process will improve Outcome: Progressing Goal: Ability to verbalize understanding of medication therapies will improve Outcome: Progressing   Problem: Cardiac: Goal: Ability to achieve and maintain adequate cardiopulmonary perfusion will improve Outcome: Progressing   Problem: Clinical Measurements: Goal: Ability to maintain clinical measurements within normal limits will improve Outcome: Not Progressing   Problem: Activity: Goal: Risk for activity intolerance will decrease Outcome: Not Progressing   Problem: Nutrition: Goal: Adequate nutrition will be maintained Outcome: Not Progressing   Problem: Coping: Goal: Level of anxiety will decrease Outcome: Not Progressing   Problem:  Education: Goal: Individualized Educational Video(s) Outcome: Not Progressing   Problem: Activity: Goal: Capacity to carry out activities will improve Outcome: Not Progressing

## 2020-11-05 NOTE — ED Notes (Signed)
I called resp to notify them of bipap orders.  States will be here soon.  Pt states he wears CPAP a lot at home.  MD aware

## 2020-11-05 NOTE — Progress Notes (Signed)
Patient is congested cough an mouth breather and asked for a Mask for oxygen

## 2020-11-05 NOTE — H&P (Signed)
Date: 11/05/2020               Patient Name:  Mason Patton MRN: [id number]  DOB: 1945/12/20 Age / Sex: 75 y.o., male   PCP: Roger Kill, PA-C         Medical Service: Internal Medicine Teaching Service         Attending Physician: Dr. Inez Catalina, MD    First Contact: Dr. Evlyn Kanner Pager: [phone]  Second Contact: Dr. Eliezer Bottom Pager: [phone]       After Hours (After 5p/  First Contact Pager: [phone]  weekends / holidays): Second Contact Pager: [phone]   Chief Complaint: generalized fatigue, dyspnea, fevers  History of Present Illness:  Mason Patton is 75yo c[place] with chronic combined systolic and diastolic heart failure, CAD s/p CABG, aortic stenosis s/p AVR, persistent atrial fibrillation on Eliquis, OSA on CPAP, T2DM, COPD, prior CVA presenting to [facility] with dyspnea. History mostly obtained via patient's wife given he is on BiPAP. Patient reports recent tooth extraction on Thursday, 4/28, for which he reports compliance with prophylactic amoxicillin. Patient reportedly started feeling fatigued, dyspneic when standing on Friday. During this time has had productive cough with mostly clear sputum, reports some brown-tinged sputum last night. Yesterday patient went to [facility] with similar symptoms. Work-up was overall unremarkable and patient was discharged home. Patient reportedly had fever at home of 102 overnight and was given Tylenol. This morning patient had episode of altered mental status. Patient's wife describes the patient was confused and generalized weakness, therefore EMS was called. She also notes his weight has increased from low 230's last week to low 240's today. She states patient has continued to intermittently eat fast foods despite previous physicians' advice. Mentions he normally takes his medications regularly but sometimes will miss dosages. She also reports intermittent dizziness that he has experienced recently, but no syncopal episodes.  Patient denies chest pain, nausea, vomiting, abdominal pain, dysuria, frequent urination, recent falls, urinary or bowel incontinence.   Meds:  No outpatient medications have been marked as taking for the 11/05/20 encounter [facility] Encounter).   Allergies: Allergies as of 11/05/2020 - Review Complete 11/05/2020  Allergen Reaction Noted  . Codeine Nausea And Vomiting 09/08/2011  . Demerol Nausea And Vomiting 09/08/2011   Past Medical History:  Diagnosis Date  . Aortic stenosis    09/30/19 echo ([facility], Cardiologist Dr. Clovis Riley): Mild-moderate AS, MaxVel 308 cm/s, MeanPG 22 mmHg, AVA 1.6 cm2  . Arthritis   . Atrial fibrillation (HCC)   . Carotid artery stenosis    11/03/19 [place] ([facility]): 50-69% RICA stenosis  . Cholecystitis with cholelithiasis   . Eczema   . Emphysema of lung (HCC)   . Gallstones and inflammation of gallbladder without obstruction   . Heart murmur   . History of nuclear stress test 02/23/2010   dipyridamole; normal pattern of perfusion in all regions; normal, low risk study   . Hyperlipidemia   . Hypertension   . 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 Meningoencephalitis   . Obesity   . OSA on CPAP    last sleep study >8 yrs  . Pneumonia    "twice" (05/19/2013)  . Shortness of breath   . Stroke [facility])    "they said I had a minor one when I had menigitis" (05/19/2013)  . Thyroid disease   . Tobacco abuse    quit 11/08/2012  . Type II diabetes mellitus (HCC)   .  Umbilical hernia    Family History:  Family History  Problem Relation Age of Onset  . Cancer Maternal Aunt        colon  . Cancer Maternal Aunt        lung  . Thyroid disease Mother   . Hyperlipidemia Father   . Diabetes Maternal Grandmother   . Asthma Child   . Diabetes type I Child    Social History:  Lives at home with wife, independent in ADL's. Retired 2 years ago, was Engineer, drilling working with [employer]. He is a current smoker,  1/2 ppd for last 46yrs. Denies any alcohol or illicit drug use    Review of Systems: A complete ROS was negative except as per HPI.   Physical Exam: Blood pressure 125/62, pulse 78, temperature 100 F (37.8 C), temperature source Oral, resp. rate (!) 28, height 5\' 10"  (1.778 m), weight 108.8 kg, SpO2 100 %. General: Laying in bed, on BiPAP, no acute distress HENT: Normocephalic, atraumatic CV: Irregular rate, rhythm. No m/r/g appreciated, although difficult to assess given BiPAP. 2+ distal pulses bilaterally Pulm: Normal work of breathing on BiPAP.  GI: Soft, non-tender, non-distended. Normoactive bowel sounds. MSK: No pitting edema bilaterally. Skin: Warm, dry. No rashes, lesions appreciated. Neuro: Awake, alert, oriented x4. CN in tact. Motor 5/5 throughout, sensation in tact throughout.  Psych: Normal mood, affect.  CBC Latest Ref Rng & Units 11/05/2020 11/05/2020 11/04/2020  WBC 4.0 - 10.5 K/uL - 7.9 7.8  Hemoglobin 13.0 - 17.0 g/dL 11/06/2020) 7.7(L) 7.6(L)  Hematocrit 39.0 - 52.0 % 25.0(L) 26.5(L) 25.5(L)  Platelets 150 - 400 K/uL - 208 192   BMP Latest Ref Rng & Units 11/05/2020 11/05/2020 11/04/2020  Glucose 70 - 99 mg/dL 11/06/2020) 035(K) 78  BUN 8 - 23 mg/dL 14 13 14   Creatinine 0.61 - 1.24 mg/dL 093(G 1.82  BUN/Creat Ratio 10 - 24 - - -  Sodium 135 - 145 mmol/L 134(L) 134(L) 132(L)  Potassium 3.5 - 5.1 mmol/L 3.9 3.8 3.5  Chloride 98 - 111 mmol/L 97(L) 97(L) 98  CO2 22 - 32 mmol/L - 26 26  Calcium 8.9 - 10.3 mg/dL - 8.6(L) 8.2(L)   Lactic 2.5 > 1.7 Troponin 90 > 108 BNP 988  EKG: personally reviewed my interpretation is atrial fibrillation, LBBB  CXR: personally reviewed my interpretation is similar to previous study on 4/30, although appears to have increased interstitial infiltrates from 4/27 study. No pleural effusions appreciated.  Assessment & Plan by Problem: Mason Patton is [phone] cismale with chronic combined systolic and diastolic heart failure, CAD s/p CABG, aortic  stenosis s/p AVR, persistent atrial fibrillation on Eliquis, OSA on CPAP, T2DM, COPD, prior CVA admitted 5/1 for acute on chronic heart failure exacerbation, possibly 2/2 underlying infection.  Active Problems:   Dyspnea  #Acute on chronic heart failure exacerbation #Possible underlying respiratory infection Patient with mild low-grade fever on arrival, otherwise hemodynamically stable. Patient placed on BiPAP for comfort measures, previously sating >95% on room air. Initial lab work in ED revealed elevated troponin, lactic acid, and BNP. Initially given IVF with resolution of lacticemia then lasix for hypervolemia. On my exam, difficult to assess pulmonary volume status due to BiPAP machine, but no pitting edema bilaterally. JVD difficult to assess d/t body habitus. Patient does have mild low-grade fever, productive cough, and dyspnea concerning for pneumonia, but no signs of infection on imaging. He did have recent dental procedure, but has been compliant with amoxicillin and no new murmurs  appreciated. Will d/c antibiotics at this time, will follow-up procal in AM for further evaluation. Possible patient has viral respiratory infection causing acute heart failure exacerbation. Other possible etiologies include medication non-compliance and high salt diet. Patient was given IV lasix 40mg  in ED, will monitor I/O's and re-assess volume status in AM for further diuresis needs.  - F/u respiratory panel, UA, procal - D/c rocephin, azithromycin - Strict I&O's - Daily weights - F/u CBC, BMP, Mg in AM  #NSTEMI #CAD s/p CABG #Aortic stenosis s/p AVR On arrival troponin 90>108. ECG overall unchanged from previous. Patient denies current or recent chest pain. Most likely elevated troponin is due to demand ischemia 2/2 heart failure exacerbation, underlying respiratory infection. Will continue on patient's home ASA, Eliquis.  - Trend troponin - C/w home aspirin, Eliquis  #Persistent atrial  fibrillation On arrival, patient in Afib but rate controlled. Given acute infection will hold coreg for now, but can re-start if becomes tachycardic or hypertensive. - F/u TSH - Hold coreg home - C/w home Eliquis  #OSA on CPAP Patient reports compliance with CPAP. Unclear if patient's altered mental status earlier today was due to hypercapnia vs infectious cause, but he is currently alert and oriented x4. Will continue with CPAP nightly. - CPAP QHS  #Type 2 DM Last A1c 7.4 09/06/20. Glucose 136 on arrival. Patient on metformin, glipizide at home.  - CBG monitoring TID - SSI  #COPD #Tobacco use disorder - Ventolin 1-2 puffs q4h PRN - Nicotine patch  Dispo: Admit patient to Inpatient with expected length of stay greater than 2 midnights.  Signed: 11/06/20, MD 11/05/2020, 10:02 AM  Pager: [phone] After 5pm on weekdays and 1pm on weekends: On Call pager: [phone]

## 2020-11-05 NOTE — ED Notes (Signed)
Peri care provided to pt and condom cath placed. Gave pt 2 new clean blankets.

## 2020-11-05 NOTE — ED Notes (Signed)
MD notified of troponin results.

## 2020-11-05 NOTE — ED Notes (Signed)
RT in with pt placing bipap

## 2020-11-05 NOTE — Progress Notes (Signed)
Patient admitted from ED on Bipap 40% fi02 at a rate of 12. Orders has been discontinued for B-pap to use n/c and resume cpap at night. R?T assisting with transition, Wife Dois Davenport was updated on patients status.

## 2020-11-05 NOTE — Progress Notes (Signed)
Pt placed on cpap for the night. °

## 2020-11-05 NOTE — ED Notes (Signed)
Pt prefers to stay on CPAP, RT notified and will contact me to assist with transfer to floor

## 2020-11-05 NOTE — ED Provider Notes (Signed)
[facility] EMERGENCY DEPARTMENT Provider Note   CSN: [id number] Arrival date & time: 11/05/20  0718     History Chief Complaint  Patient presents with  . Shortness of Breath  . Altered Mental Status    The SOB  started yesterday and pt went [employer]. Then went home and developed AMS, had episode of incontinence, slight bleeding in mouth possibly from tooth pulled a week ago    Mason Patton is a 75 y.o. male.  75 year old male with prior medical history as detailed below presents for evaluation.  [facility] reportedly was at an [facility] ED yesterday for evaluation.  Patient presents today to [facility] with complaint of shortness of breath.  EMS reports temperature of 100.8 on their initial evaluation.  Patient also complains of significant cough.  He denies chest pain.  Patient is status post recent dental extraction approximate 1 week ago.  He is still taking amoxicillin as previously prescribed.    The history is provided by the patient, medical records and the EMS personnel.  Shortness of Breath Severity:  Moderate Onset quality:  Gradual Duration:  2 days Timing:  Constant Progression:  Worsening Chronicity:  New Relieved by:  Nothing Worsened by:  Nothing Altered Mental Status      Past Medical History:  Diagnosis Date  . Aortic stenosis    09/30/19 echo ([facility], Cardiologist Dr. Geraldo Pitter): Mild-moderate AS, MaxVel 308 cm/s, MeanPG 22 mmHg, AVA 1.6 cm2  . Arthritis   . Atrial fibrillation ([place])   . Carotid artery stenosis    11/03/19 [place] ([facility]): 50-69% RICA stenosis  . Cholecystitis with cholelithiasis   . Eczema   . Emphysema of lung ([place])   . Gallstones and inflammation of gallbladder without obstruction   . Heart murmur   . History of nuclear stress test 02/23/2010   dipyridamole; normal pattern of perfusion in all regions; normal, low risk study   . Hyperlipidemia   . Hypertension   . Hypothyroidism    "had thyroid  killed w/radiation" (05/19/2013)  . Meningoencephalitis   . Obesity   . OSA on CPAP    last sleep study >8 yrs  . Pneumonia    "twice" (05/19/2013)  . Shortness of breath   . Stroke [facility])    "they said I had a minor one when I had menigitis" (05/19/2013)  . Thyroid disease   . Tobacco abuse    quit 11/08/2012  . Type II diabetes mellitus ([place])   . Umbilical hernia     Patient Active Problem List   Diagnosis Date Noted  . Coronary artery disease 09/11/2020  . Aortic stenosis, severe 09/07/2020  . Unstable angina ([place]) 09/06/2020  . Arthritis of right knee 11/24/2019  . Unilateral primary osteoarthritis, right knee   . Uncontrolled type 2 diabetes mellitus with peripheral neuropathy ([place]) 03/27/2015  . 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 CVA (cerebral infarction) 11/16/2013  . Dizziness 11/15/2013  . Left sided lacunar infarction ([place]) 11/15/2013  . Lacunar infarction ([place]) 06/01/2013  . Acute, but ill-defined, cerebrovascular disease 06/01/2013  . 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 Acute CVA (cerebrovascular accident) ([place]) 03/29/2013  . Numbness and tingling 03/27/2013  . Hyperglycemia 03/26/2013  . Meningitis 03/25/2013  . Encephalopathy acute 03/23/2013  . Fever 03/23/2013  . OSA on CPAP 03/23/2013  . Obesity 03/23/2013  . Murmur 03/16/2013  . Preoperative cardiovascular examination 03/16/2013  . Dyslipidemia 03/16/2013  . Hypertension   . Cholecystitis with cholelithiasis   . Apnea, sleep 01/23/2010  Past Surgical History:  Procedure Laterality Date  . AORTIC VALVE REPLACEMENT N/A 09/11/2020   Procedure: AORTIC VALVE REPLACEMENT (AVR);  Surgeon: Gaye Pollack, MD;  Location: [place];  Service: Open Heart Surgery;  Laterality: N/A;  . CHOLECYSTECTOMY  09/09/2011   Procedure: LAPAROSCOPIC CHOLECYSTECTOMY WITH INTRAOPERATIVE CHOLANGIOGRAM;  Surgeon: Belva Crome, MD;  Location: [place];  Service: General;  Laterality: N/A;  . CLIPPING OF ATRIAL APPENDAGE N/A  09/11/2020   Procedure: CLIPPING OF ATRIAL APPENDAGE;  Surgeon: Gaye Pollack, MD;  Location: [place];  Service: Open Heart Surgery;  Laterality: N/A;  . CORONARY ARTERY BYPASS GRAFT N/A 09/11/2020   Procedure: CORONARY ARTERY BYPASS GRAFTING (CABG), ON PUMP, TIMES 2 , USING LEFT INTERNAL MAMMARY ARTERY AND ENDOSCOPICALLY HARVESTED RIGHT GREATER SAPHENOUS VEIN;  Surgeon: Gaye Pollack, MD;  Location: [place];  Service: Open Heart Surgery;  Laterality: N/A;  . HERNIA REPAIR  04/2006   UHR  . IR THORACENTESIS ASP PLEURAL SPACE W/IMG GUIDE  10/25/2020  . JOINT REPLACEMENT    . KNEE ARTHROSCOPY Right    "w2 times" (05/19/2013)  . RIGHT/LEFT HEART CATH AND CORONARY ANGIOGRAPHY N/A 09/07/2020   Procedure: RIGHT/LEFT HEART CATH AND CORONARY ANGIOGRAPHY;  Surgeon: Lorretta Harp, MD;  Location: [place] CV LAB;  Service: Cardiovascular;  Laterality: N/A;  . TEE WITHOUT CARDIOVERSION N/A 09/11/2020   Procedure: TRANSESOPHAGEAL ECHOCARDIOGRAM (TEE);  Surgeon: Gaye Pollack, MD;  Location: [place];  Service: Open Heart Surgery;  Laterality: N/A;  . TONSILLECTOMY    . TOTAL KNEE ARTHROPLASTY Left 05/19/2013  . TOTAL KNEE ARTHROPLASTY Left 05/19/2013   Procedure: TOTAL KNEE ARTHROPLASTY;  Surgeon: Newt Minion, MD;  Location: [place];  Service: Orthopedics;  Laterality: Left;  Left Total Knee Arthroplasty  . TOTAL KNEE ARTHROPLASTY Right 11/24/2019  . TOTAL KNEE ARTHROPLASTY Right 11/24/2019   Procedure: RIGHT TOTAL KNEE ARTHROPLASTY;  Surgeon: Newt Minion, MD;  Location: [place];  Service: Orthopedics;  Laterality: Right;  . TRANSTHORACIC ECHOCARDIOGRAM  03/28/2005   EF=>55%, mod conc LVH; LA mod dilated & borderline RA enlargement; mild TR; mild pulm valve regurg       Family History  Problem Relation Age of Onset  . Cancer Maternal Aunt        colon  . Cancer Maternal Aunt        lung  . Thyroid disease Mother   . Hyperlipidemia Father   . Diabetes Maternal Grandmother   . Asthma Child   .  Diabetes type I Child     Social History   Tobacco Use  . Smoking status: Current Every Day Smoker    Packs/day: 0.50    Years: 50.00    Pack years: 25.00    Types: Cigarettes  . Smokeless tobacco: Never Used  Vaping Use  . Vaping Use: Never used  Substance Use Topics  . Alcohol use: No  . Drug use: No    Home Medications Prior to Admission medications   Medication Sig Start Date End Date Taking? Authorizing Provider  acetaminophen (TYLENOL) 325 MG tablet Take 2 tablets (650 mg total) by mouth every 4 (four) hours as needed for headache or mild pain. 09/15/20   Nani Skillern, PA-C  amoxicillin (AMOXIL) 500 MG capsule Take by mouth 3 (three) times daily. 11/01/20   [provider]  apixaban (ELIQUIS) 5 MG TABS tablet Take 5 mg by mouth 2 (two) times daily.    [provider]  aspirin EC  81 MG EC tablet Take 1 tablet (81 mg total) by mouth daily. Swallow whole. 09/18/20   Elgie Collard, PA-C  atorvastatin (LIPITOR) 40 MG tablet Take 40 mg by mouth at bedtime. Reported on 01/11/2016 11/22/15   [provider]  Blood Glucose Monitoring Suppl (ONETOUCH VERIO FLEX SYSTEM) W/DEVICE KIT 1 each by Does not apply route daily. Dx: E11.9 03/30/15   Philemon Kingdom, MD  carvedilol (COREG) 12.5 MG tablet Take 1 tablet (12.5 mg total) by mouth 2 (two) times daily with a meal. 09/17/20   Elgie Collard, PA-C  cholecalciferol (VITAMIN D3) 25 MCG (1000 UNIT) tablet Take 1,000 Units by mouth daily.    [provider]  Cyanocobalamin (VITAMIN B12) 500 MCG TABS Take by mouth. Per patient taking 1 tablet twice a day    [provider]  ferrous sulfate 325 (65 FE) MG tablet Take 1 tablet by mouth 3 (three) times a week. 03/21/20   [provider]  furosemide (LASIX) 20 MG tablet Take 3 tablets (60 mg total) by mouth in the AM, then take 2 tabs (40 mg total) by mouth in the PM for 3 days only, then go back to taking 2 tabs (40 mg total) by mouth daily  thereafter. 10/02/20   Freada Bergeron, MD  glipiZIDE (GLUCOTROL) 10 MG tablet Take 10 mg by mouth 2 (two) times daily before a meal.    [provider]  glucose blood (ONETOUCH VERIO) test strip Use to test blood sugar 2-3 times daily as instructed. Dx: E11.9 10/12/15   Philemon Kingdom, MD  HYDROcodone-acetaminophen (NORCO) 5-325 MG tablet Take 1 tablet by mouth every 6 (six) hours as needed for moderate pain.    [provider]  levothyroxine (SYNTHROID) 112 MCG tablet Take 224 mcg by mouth daily before breakfast.    [provider]  losartan (COZAAR) 50 MG tablet Take 1 tablet (50 mg total) by mouth daily. 10/02/20   Freada Bergeron, MD  metFORMIN (GLUCOPHAGE-XR) 500 MG 24 hr tablet Take 500 mg by mouth daily with breakfast.    [provider]  Multiple Vitamins-Minerals (MULTIVITAMIN WITH MINERALS) tablet Take 1 tablet by mouth daily.    [provider]  Multiple Vitamins-Minerals (PRESERVISION AREDS 2+MULTI VIT PO) Take 1 capsule by mouth in the morning and at bedtime.    [provider]  nitroGLYCERIN (NITROSTAT) 0.4 MG SL tablet Place 0.4 mg under the tongue every 5 (five) minutes as needed. 08/17/20   [provider]  omeprazole (PRILOSEC) 20 MG capsule Take 1 capsule by mouth daily. 01/18/20   [provider]  Jonetta Speak LANCETS FINE MISC Use to test blood sugar 2-3 times daily as instructed. Dx: E11.9 10/12/15   Philemon Kingdom, MD  potassium chloride SA (KLOR-CON) 20 MEQ tablet Take 1 tablet (20 mEq total) by mouth 2 (two) times daily. Patient not taking: No sig reported 10/02/20   Freada Bergeron, MD  traMADol (ULTRAM) 50 MG tablet Take 1 tablet (50 mg total) by mouth every 4 (four) hours as needed for moderate pain. Patient not taking: No sig reported 09/17/20   Elgie Collard, PA-C  VENTOLIN HFA 108 (90 Base) MCG/ACT inhaler Inhale 1-2 puffs into the lungs every 4 (four) hours as needed for wheezing or  shortness of breath.  07/12/15   [provider]    Allergies    Codeine and Demerol  Review of Systems   Review of Systems  Respiratory: Positive for shortness  of breath.   All other systems reviewed and are negative.   Physical Exam Updated Vital Signs BP 125/62   Pulse 78   Temp 100 F (37.8 C) (Oral)   Resp (!) 28   Ht '5\' 10"'  (1.778 m)   Wt 108.8 kg   SpO2 100%   BMI 34.42 kg/m   Physical Exam Vitals and nursing note reviewed.  Constitutional:      General: He is not in acute distress.    Appearance: He is well-developed.  HENT:     Head: Normocephalic and atraumatic.     Mouth/Throat:     Comments: Recent dental extraction to the right lower jaw.  Scant amount of dried blood are surrounded the empty socket.  No active bleeding. Eyes:     Conjunctiva/sclera: Conjunctivae normal.     Pupils: Pupils are equal, round, and reactive to light.  Cardiovascular:     Rate and Rhythm: Regular rhythm. Tachycardia present.     Heart sounds: Normal heart sounds.  Pulmonary:     Effort: Pulmonary effort is normal. No respiratory distress.     Breath sounds: Examination of the right-lower field reveals decreased breath sounds. Examination of the left-lower field reveals decreased breath sounds. Decreased breath sounds present.  Abdominal:     General: There is no distension.     Palpations: Abdomen is soft.     Tenderness: There is no abdominal tenderness.  Musculoskeletal:        General: No deformity. Normal range of motion.     Cervical back: Normal range of motion and neck supple.  Skin:    General: Skin is warm and dry.  Neurological:     Mental Status: He is alert and oriented to person, place, and time.     ED Results / Procedures / Treatments   Labs (all labs ordered are listed, but only abnormal results are displayed) Labs Reviewed  CBC WITH DIFFERENTIAL/PLATELET - Abnormal; Notable for the following components:      Result Value   RBC 3.59 (*)     Hemoglobin 7.7 (*)    HCT 26.5 (*)    MCV 73.8 (*)    MCH 21.4 (*)    MCHC 29.1 (*)    RDW 17.7 (*)    All other components within normal limits  PROTIME-INR - Abnormal; Notable for the following components:   Prothrombin Time 19.5 (*)    INR 1.7 (*)    All other components within normal limits  COMPREHENSIVE METABOLIC PANEL - Abnormal; Notable for the following components:   Sodium 134 (*)    Chloride 97 (*)    Glucose, Bld 136 (*)    Calcium 8.6 (*)    Total Protein 5.9 (*)    Albumin 3.1 (*)    All other components within normal limits  BRAIN NATRIURETIC PEPTIDE - Abnormal; Notable for the following components:   B Natriuretic Peptide 988.4 (*)    All other components within normal limits  LACTIC ACID, PLASMA - Abnormal; Notable for the following components:   Lactic Acid, Venous 2.5 (*)    All other components within normal limits  I-STAT CHEM 8, ED - Abnormal; Notable for the following components:   Sodium 134 (*)    Chloride 97 (*)    Glucose, Bld 134 (*)    Calcium, Ion 1.13 (*)    Hemoglobin 8.5 (*)    HCT 25.0 (*)    All other components within normal limits  TROPONIN I (  HIGH SENSITIVITY) - Abnormal; Notable for the following components:   Troponin I (High Sensitivity) 90 (*)    All other components within normal limits  CULTURE, BLOOD (ROUTINE X 2)  CULTURE, BLOOD (ROUTINE X 2)  RESP PANEL BY RT-PCR (FLU A&B, COVID) ARPGX2  LACTIC ACID, PLASMA  URINALYSIS, ROUTINE W REFLEX MICROSCOPIC  TROPONIN I (HIGH SENSITIVITY)    EKG EKG Interpretation  Date/Time:  Sunday Nov 05 2020 07:59:22 EDT Ventricular Rate:  84 PR Interval:    QRS Duration: 111 QT Interval:  382 QTC Calculation: 452 R Axis:   -56 Text Interpretation: Atrial fibrillation Incomplete left bundle branch block Confirmed by Dene Gentry [id number]) on 11/05/2020 8:58:15 AM   Radiology DG Chest Port 1 View  Result Date: 11/05/2020 CLINICAL DATA:  75 year old male with fever, shortness of breath,  confusion. EXAM: PORTABLE CHEST 1 VIEW COMPARISON:  Portable chest yesterday, chest radiographs 11/01/2020. FINDINGS: Portable AP semi upright view at 0742 hours. Stable lung volumes and mediastinal contours with previous cardiac valve replacement. Allowing for portable technique the lungs are clear. No pneumothorax. No acute osseous abnormality identified. IMPRESSION: No acute cardiopulmonary abnormality. Electronically Signed   By: Genevie Ann M.D.   On: 11/05/2020 08:07   DG Chest Portable 1 View  Result Date: 11/04/2020 CLINICAL DATA:  Fever, short of breath EXAM: PORTABLE CHEST 1 VIEW COMPARISON:  11/01/2020 FINDINGS: Single frontal view of the chest demonstrates stable postsurgical changes from median sternotomy and aortic valve prosthesis. Cardiac silhouette is stable. No acute airspace disease, effusion, or pneumothorax. No acute bony abnormalities. IMPRESSION: 1. No acute intrathoracic process. Electronically Signed   By: Randa Ngo M.D.   On: 11/04/2020 19:14    Procedures Procedures  CRITICAL CARE Performed by: Valarie Merino   Total critical care time: 30 minutes  Critical care time was exclusive of separately billable procedures and treating other patients.  Critical care was necessary to treat or prevent imminent or life-threatening deterioration.  Critical care was time spent personally by me on the following activities: development of treatment plan with patient and/or surrogate as well as nursing, discussions with consultants, evaluation of patient's response to treatment, examination of patient, obtaining history from patient or surrogate, ordering and performing treatments and interventions, ordering and review of laboratory studies, ordering and review of radiographic studies, pulse oximetry and re-evaluation of patient's condition.     Medications Ordered in ED Medications  acetaminophen (TYLENOL) tablet 1,000 mg (1,000 mg Oral Given 11/05/20 0811)  cefTRIAXone  (ROCEPHIN) 1 g in sodium chloride 0.9 % 100 mL IVPB (0 g Intravenous Stopped 11/05/20 0840)  azithromycin (ZITHROMAX) 500 mg in sodium chloride 0.9 % 250 mL IVPB (500 mg Intravenous New Bag/Given 11/05/20 0805)  furosemide (LASIX) injection 40 mg (40 mg Intravenous Given 11/05/20 5732)    ED Course  I have reviewed the triage vital signs and the nursing notes.  Pertinent labs & imaging results that were available during my care of the patient were reviewed by me and considered in my medical decision making (see chart for details).    MDM Rules/Calculators/A&P                          MDM  MSE complete  RODRIQUES BADIE was evaluated in Emergency Department on 11/05/2020 for the symptoms described in the history of present illness. He was evaluated in the context of the global COVID-19 pandemic, which necessitated consideration that the patient might be at  risk for infection with the SARS-CoV-2 virus that causes COVID-19. Institutional protocols and algorithms that pertain to the evaluation of patients at risk for COVID-19 are in a state of rapid change based on information released by regulatory bodies including the CDC and federal and state organizations. These policies and algorithms were followed during the patient's care in the ED.   Patient presents with complaint of fever, malaise, and shortness of breath.  Symptoms began gradually over the last 2 to 3 days.  He was seen at [facility], ED yesterday for same complaint.  Symptoms worsened overnight.  He complains of worsening fever with T-max of 102 at home.  Patient arrives by EMS with significant coughing and reported dyspnea.  Room air sats are in the upper 90s.  Patient reports that he uses CPAP at home regularly.  He request BiPAP now given that his CPAP machine is still at home.  Work-up is suggestive of possible CHF exacerbation with possible early pneumonia.  Broad-spectrum antibiotics administered.  Diuretics administered.  Patient  will require admission for further work-up and treatment.  Internal medicine teaching service is aware of case and will evaluate.  Patient and patient's family at bedside understand plan of care.    Final Clinical Impression(s) / ED Diagnoses Final diagnoses:  Fever, unspecified fever cause  Dyspnea, unspecified type    Rx / DC Orders ED Discharge Orders    None       Valarie Merino, MD 11/05/20 [phone]

## 2020-11-05 NOTE — ED Triage Notes (Signed)
Was at [facility] for c/o sob. Then became confused and incontinent at home which wife states is not normal for him

## 2020-11-06 DIAGNOSIS — Z9981 Dependence on supplemental oxygen: Secondary | ICD-10-CM

## 2020-11-06 DIAGNOSIS — J189 Pneumonia, unspecified organism: Secondary | ICD-10-CM

## 2020-11-06 DIAGNOSIS — Z825 Family history of asthma and other chronic lower respiratory diseases: Secondary | ICD-10-CM | POA: Diagnosis not present

## 2020-11-06 DIAGNOSIS — E119 Type 2 diabetes mellitus without complications: Secondary | ICD-10-CM | POA: Diagnosis present

## 2020-11-06 DIAGNOSIS — E039 Hypothyroidism, unspecified: Secondary | ICD-10-CM | POA: Diagnosis present

## 2020-11-06 DIAGNOSIS — Z951 Presence of aortocoronary bypass graft: Secondary | ICD-10-CM | POA: Diagnosis not present

## 2020-11-06 DIAGNOSIS — J441 Chronic obstructive pulmonary disease with (acute) exacerbation: Secondary | ICD-10-CM | POA: Diagnosis present

## 2020-11-06 DIAGNOSIS — J440 Chronic obstructive pulmonary disease with acute lower respiratory infection: Secondary | ICD-10-CM | POA: Diagnosis present

## 2020-11-06 DIAGNOSIS — I350 Nonrheumatic aortic (valve) stenosis: Secondary | ICD-10-CM | POA: Diagnosis present

## 2020-11-06 DIAGNOSIS — I509 Heart failure, unspecified: Secondary | ICD-10-CM

## 2020-11-06 DIAGNOSIS — E0590 Thyrotoxicosis, unspecified without thyrotoxic crisis or storm: Secondary | ICD-10-CM | POA: Diagnosis present

## 2020-11-06 DIAGNOSIS — I214 Non-ST elevation (NSTEMI) myocardial infarction: Secondary | ICD-10-CM | POA: Diagnosis not present

## 2020-11-06 DIAGNOSIS — E876 Hypokalemia: Secondary | ICD-10-CM

## 2020-11-06 DIAGNOSIS — D500 Iron deficiency anemia secondary to blood loss (chronic): Secondary | ICD-10-CM | POA: Diagnosis not present

## 2020-11-06 DIAGNOSIS — Z6835 Body mass index (BMI) 35.0-35.9, adult: Secondary | ICD-10-CM | POA: Diagnosis not present

## 2020-11-06 DIAGNOSIS — I2510 Atherosclerotic heart disease of native coronary artery without angina pectoris: Secondary | ICD-10-CM | POA: Diagnosis present

## 2020-11-06 DIAGNOSIS — G4733 Obstructive sleep apnea (adult) (pediatric): Secondary | ICD-10-CM

## 2020-11-06 DIAGNOSIS — Z8673 Personal history of transient ischemic attack (TIA), and cerebral infarction without residual deficits: Secondary | ICD-10-CM | POA: Diagnosis not present

## 2020-11-06 DIAGNOSIS — R0600 Dyspnea, unspecified: Secondary | ICD-10-CM | POA: Diagnosis present

## 2020-11-06 DIAGNOSIS — I110 Hypertensive heart disease with heart failure: Secondary | ICD-10-CM | POA: Diagnosis present

## 2020-11-06 DIAGNOSIS — I4819 Other persistent atrial fibrillation: Secondary | ICD-10-CM | POA: Diagnosis present

## 2020-11-06 DIAGNOSIS — E785 Hyperlipidemia, unspecified: Secondary | ICD-10-CM | POA: Diagnosis present

## 2020-11-06 DIAGNOSIS — D509 Iron deficiency anemia, unspecified: Secondary | ICD-10-CM | POA: Diagnosis present

## 2020-11-06 DIAGNOSIS — R509 Fever, unspecified: Secondary | ICD-10-CM

## 2020-11-06 DIAGNOSIS — Z20822 Contact with and (suspected) exposure to covid-19: Secondary | ICD-10-CM | POA: Diagnosis present

## 2020-11-06 DIAGNOSIS — Z885 Allergy status to narcotic agent status: Secondary | ICD-10-CM | POA: Diagnosis not present

## 2020-11-06 DIAGNOSIS — I5043 Acute on chronic combined systolic (congestive) and diastolic (congestive) heart failure: Secondary | ICD-10-CM | POA: Diagnosis present

## 2020-11-06 DIAGNOSIS — F17210 Nicotine dependence, cigarettes, uncomplicated: Secondary | ICD-10-CM | POA: Diagnosis present

## 2020-11-06 DIAGNOSIS — Z952 Presence of prosthetic heart valve: Secondary | ICD-10-CM | POA: Diagnosis not present

## 2020-11-06 DIAGNOSIS — E669 Obesity, unspecified: Secondary | ICD-10-CM | POA: Diagnosis present

## 2020-11-06 LAB — FERRITIN: Ferritin: 16 ng/mL — ABNORMAL LOW (ref 24–336)

## 2020-11-06 LAB — BASIC METABOLIC PANEL
Anion gap: 10 (ref 5–15)
BUN: 13 mg/dL (ref 8–23)
CO2: 28 mmol/L (ref 22–32)
Calcium: 8.6 mg/dL — ABNORMAL LOW (ref 8.9–10.3)
Chloride: 97 mmol/L — ABNORMAL LOW (ref 98–111)
Creatinine, Ser: 0.82 mg/dL (ref 0.61–1.24)
GFR, Estimated: >60 mL/min (ref >60)
Glucose, Bld: 105 mg/dL — ABNORMAL HIGH (ref 70–99)
Potassium: 3.4 mmol/L — ABNORMAL LOW (ref 3.5–5.1)
Sodium: 135 mmol/L (ref 135–145)

## 2020-11-06 LAB — CBC
HCT: 24 % — ABNORMAL LOW (ref 39.0–52.0)
Hemoglobin: 7.1 g/dL — ABNORMAL LOW (ref 13.0–17.0)
MCH: 21.6 pg — ABNORMAL LOW (ref 26.0–34.0)
MCHC: 29.6 g/dL — ABNORMAL LOW (ref 30.0–36.0)
MCV: 72.9 fL — ABNORMAL LOW (ref 80.0–100.0)
Platelets: 184 10*3/uL (ref 150–400)
RBC: 3.29 MIL/uL — ABNORMAL LOW (ref 4.22–5.81)
RDW: 17.7 % — ABNORMAL HIGH (ref 11.5–15.5)
WBC: 7.1 10*3/uL (ref 4.0–10.5)
nRBC: 0 % (ref 0.0–0.2)

## 2020-11-06 LAB — GLUCOSE, CAPILLARY
Glucose-Capillary: 109 mg/dL — ABNORMAL HIGH (ref 70–99)
Glucose-Capillary: 134 mg/dL — ABNORMAL HIGH (ref 70–99)
Glucose-Capillary: 141 mg/dL — ABNORMAL HIGH (ref 70–99)
Glucose-Capillary: 140 mg/dL — ABNORMAL HIGH (ref 70–99)

## 2020-11-06 LAB — IRON AND TIBC
Iron: 14 ug/dL — ABNORMAL LOW (ref 45–182)
Saturation Ratios: 4 % — ABNORMAL LOW (ref 17.9–39.5)
TIBC: 337 ug/dL (ref 250–450)
UIBC: 323 ug/dL

## 2020-11-06 LAB — STREP PNEUMONIAE URINARY ANTIGEN: Strep Pneumo Urinary Antigen: NEGATIVE

## 2020-11-06 LAB — T4, FREE: Free T4: 1.4 ng/dL — ABNORMAL HIGH (ref 0.61–1.12)

## 2020-11-06 LAB — MAGNESIUM: Magnesium: 1.8 mg/dL (ref 1.7–2.4)

## 2020-11-06 LAB — PROCALCITONIN: Procalcitonin: <0.1 ng/mL

## 2020-11-06 MED ORDER — SODIUM CHLORIDE 0.9 % IV SOLN
510.0000 mg | INTRAVENOUS | Status: DC
  Administered 2020-11-06: 510 mg via INTRAVENOUS
  Filled 2020-11-06: qty 17

## 2020-11-06 MED ORDER — POTASSIUM CHLORIDE CRYS ER 20 MEQ PO TBCR
40.0000 meq | EXTENDED_RELEASE_TABLET | Freq: Two times a day (BID) | ORAL | Status: DC
  Administered 2020-11-06 – 2020-11-07 (×3): 40 meq via ORAL
  Filled 2020-11-06 (×3): qty 2

## 2020-11-06 MED ORDER — LEVOTHYROXINE SODIUM 112 MCG PO TABS: 112.0000 ug | ORAL_TABLET | Freq: Every day | ORAL | Status: DC

## 2020-11-06 MED ORDER — SODIUM CHLORIDE 0.9 % IR SOLN
15.0000 mL | Freq: Three times a day (TID) | Status: DC
  Administered 2020-11-06 – 2020-11-07 (×3): 15 mL

## 2020-11-06 MED ORDER — MAGNESIUM SULFATE IN D5W 1-5 GM/100ML-% IV SOLN
1.0000 g | Freq: Once | INTRAVENOUS | Status: AC
  Administered 2020-11-06: 1 g via INTRAVENOUS
  Filled 2020-11-06: qty 100

## 2020-11-06 MED ORDER — LEVOTHYROXINE SODIUM 100 MCG PO TABS
200.0000 ug | ORAL_TABLET | Freq: Every day | ORAL | Status: DC
  Administered 2020-11-07: 200 ug via ORAL
  Filled 2020-11-06: qty 2

## 2020-11-06 MED ORDER — SODIUM CHLORIDE 0.9 % IV SOLN
500.0000 mg | INTRAVENOUS | Status: AC
  Administered 2020-11-06 – 2020-11-07 (×2): 500 mg via INTRAVENOUS
  Filled 2020-11-06 (×3): qty 500

## 2020-11-06 MED ORDER — FUROSEMIDE 10 MG/ML IJ SOLN
40.0000 mg | Freq: Every day | INTRAMUSCULAR | Status: DC
  Administered 2020-11-06 – 2020-11-07 (×2): 40 mg via INTRAVENOUS
  Filled 2020-11-06 (×2): qty 4

## 2020-11-06 MED ORDER — SODIUM CHLORIDE 0.9 % IR SOLN: 100.0000 mL | Freq: Four times a day (QID) | Status: DC

## 2020-11-06 MED ORDER — SODIUM BICARBONATE/SODIUM CHLORIDE MOUTHWASH
Freq: Three times a day (TID) | OROMUCOSAL | Status: DC
  Filled 2020-11-06: qty 1000

## 2020-11-06 MED ORDER — SODIUM CHLORIDE 0.9 % IV SOLN
2.0000 g | INTRAVENOUS | Status: DC
  Administered 2020-11-06: 2 g via INTRAVENOUS
  Filled 2020-11-06: qty 20

## 2020-11-06 NOTE — Progress Notes (Signed)
   11/06/20 0538  Assess: MEWS Score  Temp 98.8 F (37.1 C)  BP (!) 146/76  Pulse Rate 78  ECG Heart Rate 79  Resp (!) 21  Level of Consciousness Alert  SpO2 99 %  O2 Device CPAP (w/ 3 liters bled in.)  Assess: MEWS Score  MEWS Temp 0  MEWS Systolic 0  MEWS Pulse 0  MEWS RR 1  MEWS LOC 0  MEWS Score 1  MEWS Score Color Green  Treat  Patients response to intervention Effective  Document  Patient Outcome Stabilized after interventions

## 2020-11-06 NOTE — Progress Notes (Signed)
   11/06/20 0458  Assess: MEWS Score  Temp (!) 101.8 F (38.8 C)  BP (!) 172/84  Pulse Rate 81  ECG Heart Rate 79  Resp 13  Level of Consciousness Alert  SpO2 100 %  O2 Device CPAP  Assess: MEWS Score  MEWS Temp 2  MEWS Systolic 0  MEWS Pulse 0  MEWS RR 1  MEWS LOC 0  MEWS Score 3  MEWS Score Color Yellow  Assess: if the MEWS score is Yellow or Red  Were vital signs taken at a resting state? Yes  Focused Assessment No change from prior assessment  Early Detection of Sepsis Score *See Row Information* Low  MEWS guidelines implemented *See Row Information* Yes  Treat  MEWS Interventions Administered prn meds/treatments  Pain Scale 0-10  Pain Score 0  Complains of Fever  Take Vital Signs  Increase Vital Sign Frequency  Yellow: Q 2hr X 2 then Q 4hr X 2, if remains yellow, continue Q 4hrs  Escalate  MEWS: Escalate Yellow: discuss with charge nurse/RN and consider discussing with provider and RRT  Notify: Charge Nurse/RN  Name of Charge Nurse/RN Notified Bernadene Person, RN  Date Charge Nurse/RN Notified 11/06/20  Time Charge Nurse/RN Notified 1975  Notify: Provider  Provider Name/Title Dr. Laural Benes  Date Provider Notified 11/06/20  Time Provider Notified [phone]  Notification Type Page  Notification Reason Other (Comment) (fever 101.8)  Provider response Evaluate remotely  Date of Provider Response 11/06/20  Time of Provider Response 0519  Dr. Laural Benes notified. Secure chat sent to this RN from MD. No new orders at this time.

## 2020-11-06 NOTE — Discharge Summary (Signed)
Name: Mason Patton MRN: [id number] DOB: [birth year] 75 y.o. PCP: Heywood Bene, PA-C  Date of Admission: 11/05/2020  7:18 AM Date of Discharge: 11/07/2020 Attending Physician: Sid Falcon, MD  Subjective: Patient evaluated at bedside this AM. Patient reports he's feeling well, joking with staff. Says breathing has improved some, but not to baseline. Had CPAP on overnight. Cough is improved. No fevers overnight. Reports that he is eating alright.   Discharge Diagnosis: 1. Acute on chronic heart failure exacerbation in setting of PNA 2. NSTEMI 3. Persistent atrial fibrillation 4. Iron deficiency anemia 5. Hypothyroidism  Discharge Medications: Allergies as of 11/07/2020      Reactions   Codeine Nausea And Vomiting   Demerol Nausea And Vomiting      Medication List    STOP taking these medications   amoxicillin 500 MG capsule Commonly known as: AMOXIL   traMADol 50 MG tablet Commonly known as: ULTRAM     TAKE these medications   acetaminophen 325 MG tablet Commonly known as: TYLENOL Take 2 tablets (650 mg total) by mouth every 4 (four) hours as needed for headache or mild pain. What changed: how much to take   apixaban 5 MG Tabs tablet Commonly known as: ELIQUIS Take 5 mg by mouth 2 (two) times daily.   aspirin 81 MG EC tablet Take 1 tablet (81 mg total) by mouth daily. Swallow whole.   atorvastatin 40 MG tablet Commonly known as: LIPITOR Take 40 mg by mouth at bedtime. Reported on 01/11/2016   azithromycin 500 MG tablet Commonly known as: Zithromax Take 1 tablet (500 mg total) by mouth daily for 2 days. Take 1 tablet daily for 3 days.   carvedilol 12.5 MG tablet Commonly known as: COREG Take 1 tablet (12.5 mg total) by mouth 2 (two) times daily with a meal.   cholecalciferol 25 MCG (1000 UNIT) tablet Commonly known as: VITAMIN D3 Take 1,000 Units by mouth daily.   ferrous sulfate 325 (65 FE) MG tablet Take 325 mg by mouth 3 (three) times a  week.   furosemide 20 MG tablet Commonly known as: LASIX Take 3 tablets (60 mg total) by mouth in the AM, then take 2 tabs (40 mg total) by mouth in the PM for 3 days only, then go back to taking 2 tabs (40 mg total) by mouth daily thereafter. What changed:   how much to take  how to take this  when to take this  additional instructions   glipiZIDE 10 MG tablet Commonly known as: GLUCOTROL Take 10 mg by mouth 2 (two) times daily before a meal.   glucose blood test strip Commonly known as: OneTouch Verio Use to test blood sugar 2-3 times daily as instructed. Dx: E11.9   HYDROcodone-acetaminophen 5-325 MG tablet Commonly known as: NORCO/VICODIN Take 1 tablet by mouth every 6 (six) hours as needed (for pain).   levothyroxine 200 MCG tablet Commonly known as: SYNTHROID Take 1 tablet (200 mcg total) by mouth daily before breakfast. Start taking on: Nov 08, 2020 What changed:   medication strength  how much to take   losartan 50 MG tablet Commonly known as: COZAAR Take 1 tablet (50 mg total) by mouth daily.   metFORMIN 500 MG tablet Commonly known as: GLUCOPHAGE Take 1,000 mg by mouth 2 (two) times daily with a meal. What changed: Another medication with the same name was removed. Continue taking this medication, and follow the directions you see here.   nitroGLYCERIN 0.4 MG SL  tablet Commonly known as: NITROSTAT Place 0.4 mg under the tongue every 5 (five) minutes as needed. What changed: Another medication with the same name was removed. Continue taking this medication, and follow the directions you see here.   omeprazole 20 MG capsule Commonly known as: PRILOSEC Take 20 mg by mouth daily before breakfast.   OneTouch Delica Lancets Fine Misc Use to test blood sugar 2-3 times daily as instructed. Dx: E11.9   OneTouch Verio Flex System w/Device Kit 1 each by Does not apply route daily. Dx: E11.9   potassium chloride SA 20 MEQ tablet Commonly known as:  KLOR-CON Take 1 tablet (20 mEq total) by mouth 2 (two) times daily.   PRESERVISION AREDS 2+MULTI VIT PO Take 1 capsule by mouth in the morning and at bedtime.   multivitamin with minerals tablet Take 1 tablet by mouth daily.   Centrum Silver 50+Men Tabs Take 1 tablet by mouth daily with breakfast.   Ventolin HFA 108 (90 Base) MCG/ACT inhaler Generic drug: albuterol Inhale 1-2 puffs into the lungs every 4 (four) hours as needed for wheezing or shortness of breath.   Vitamin B12 500 MCG Tabs Take 500 mcg by mouth 2 (two) times daily.       Disposition and follow-up:   Mr.Diane R Cleavenger was discharged from [facility] in Stable condition.  At the hospital follow up visit please address:  1. Acute on chronic heart failure exacerbation in setting of PNA: At discharge patient was sating well on room air, no pitting edema noted. Discharged with two more days of azithromycin, re-start other home medications.  2. Iron deficiency anemia: Gave patient Feraheme while inpatient as he became more anemic, likely due to blood loss from tooth extraction. Can make sure he is taking iron supplements as prescribed.  3. Hypothyroidism: Thyroid studies while inpatient showed hyperthyroidism in setting of acute infection. Decreased dose to 26mcg daily, recommend f/u studies in 6-8 weeks.  2.  Labs / imaging needed at time of follow-up: CBC, BMP  3.  Pending labs/ test needing follow-up: n/a  Follow-up Appointments:    Follow-up Information    [facility], [facility] Follow up.   Specialty: Home Health Services Why: HHPT Contact information: [address] [place] [address] [phone]        Heywood Bene, PA-C. Schedule an appointment as soon as possible for a visit in 1 week(s).   Specialty: Physician Assistant Contact information: [address] [place] [address] [place] [facility] Course by problem  list: 1. Acute on chronic heart failure exacerbation in setting of PNA: Patient with history chronic combined systolic and diastolic heart failure presenting with worsening dyspnea, cough, fever in setting of recent tooth extraction. Day prior to presentation patient was evaluated at [facility] with overall negative work-up. That night he developed further fever and altered mental status, leading him to come to [facility]. On arrival, patient hemodynamically stable on room air with low-grade fever. Work-up notable for elevated BNP 998, elevated lactic acid and troponin. CXR without obvious pneumonia. CXR with increased infiltrates from a week prior, but similar to day before. Most likely patient has pneumonia vs COPD exacerbation leading to mild heart failure exacerbation. Patient was give IV lasix $Remove'40mg'vLoqAkK$  in ED and started on azithromycin and ceftriaxone. The next day patient still appeared to be hypervolemic requiring 3L supplemental oxygen. Urine strep antigen negative  also resulted negative and ceftriaxone was discontinued. Patient continued to improve over the next 24 hours with another dose of IV lasix 43m. On day of discharge, patient sating appropriately on room at rest and during ambulation. PT/OT evaluated, recommending Home Health. Patient stable for discharge with plans to go to PCP within the next week.   2. NSTEMI: On arrival troponin elevated at 90. ECG in ED without ischemic changes from previous study. Patient reporting no chest pain in ED or prior to arrival. Troponin flattened 90>108>106. Most likely this is demand related. Throughout the rest of hospitalization he remained chest pain free.  3. Persistent atrial fibrillation: Patient has chronic persistent atrial fibrillation. On arrival he was rate controlled, no chest pain. He continued to be asymptomatic. BP medications were held during his hospitalization due to acute infection, but he is to resume these at discharge.  4. Iron deficiency  anemia: On arrival Hgb 7.7, which appears chronic. Iron studies revealed low ferritin and iron. Given he has had some continual bleeding from tooth extraction, gave dose Feraheme while inpatient with appropriate response. Patient to continue taking home iron supplements and follow-up with PCP.  5. Hypothyroidism: Patient has history of hypothyroidism and takes synthroid 2246m. Re-checked thyroid studies which showed hyperthyroidism. Difficult to assess fully given acute infection, but decreased Synthroid to 2005mdaily with plans to follow-up studies with PCP in 6-8 weeks.   Discharge Exam:   BP 137/70   Pulse 69   Temp 98.1 F (36.7 C) (Oral)   Resp 18   Ht _0  (1.778 m)   Wt 110.9 kg   SpO2 100%   BMI 35.08 kg/m  General: Elderly, sitting in chair, no acute distress CV: Regular rate, irregular rhythm. Distal pulses 2+ bilaterally. Pulm: Normal work of breathing on room air. Clear to auscultation bilaterally. MSK: No pitting edema bilaterally.  Pertinent Labs, Studies, and Procedures:  CBC Latest Ref Rng & Units 11/06/2020 11/05/2020 11/05/2020  WBC 4.0 - 10.5 K/uL 7.1 - 7.9  Hemoglobin 13.0 - 17.0 g/dL 7.1(L) 8.5(L) 7.7(L)  Hematocrit 39.0 - 52.0 % 24.0(L) 25.0(L) 26.5(L)  Platelets 150 - 400 K/uL 184 - 208   BMP Latest Ref Rng & Units 11/06/2020 11/05/2020 11/05/2020  Glucose 70 - 99 mg/dL 105(H) 134(H) 136(H)  BUN 8 - 23 mg/dL _1 Creatinine 0.61 - 1.24 mg/dL 0.82 0.80 0.90  BUN/Creat Ratio 10 - 24 - - -  Sodium 135 - 145 mmol/L 135 134(L) 134(L)  Potassium 3.5 - 5.1 mmol/L 3.4(L) 3.9 3.8  Chloride 98 - 111 mmol/L 97(L) 97(L) 97(L)  CO2 22 - 32 mmol/L 28 - 26  Calcium 8.9 - 10.3 mg/dL 8.6(L) - 8.6(L)   Ferritin 16 Fe 14, TIBC 337 TSH 0.185 Free T4 1.4  Discharge Instructions:   Mr. ClaRandleI am so glad you are feeling better and are able to go home! You were admitted because you were short of breath and required oxygen. We believe this was due to a mild pneumonia and  heart failure exacerbation. Thankfully, you have improved with antibiotics and diuretics. Please see the following notes:   We have prescribed you an antibiotic (azithromycin) that you will continue to take for two more days. You have already received the dose today, May 3rd. You can stop taking the amoxicillin from the dental procedure, as you have been given antibiotics throughout your hospitalization.   Otherwise, we have only made one other medication change. We did thyroid studies, which  showed elevated thyroid hormone. We have decreased the dosage for your Synthroid. You should now take 244mg. This new prescription has been sent to the pharmacy.   We have ordered Home Health to come to your house to do physical therapy.   Please make sure to follow-up with your primary care doctor within the next week.   It was a pleasure meeting you, Mr. CHopping I wish you the best and hope you stay happy and healthy!   Thank you,  PSanjuan Dame MD   Signed: BSanjuan Dame MD 11/07/2020, 1:22 PM   Pager: [phone]

## 2020-11-06 NOTE — Progress Notes (Signed)
Heart Failure Navigator Progress Note  Assessed for Heart & Vascular TOC clinic readiness.  Unfortunately at this time the patient does not meet criteria due to pneumonia primary admission diagnosis.   Navigator available for reassessment of patient.   Ozella Rocks, RN, BSN Heart Failure Nurse Navigator [phone]

## 2020-11-06 NOTE — Progress Notes (Signed)
Nutrition Brief Note  Patient identified on the Malnutrition Screening Tool (MST) Report  Wt Readings from Last 15 Encounters:  11/06/20 110.9 kg  11/04/20 108.9 kg  11/01/20 109.3 kg  10/19/20 110.2 kg  10/17/20 111 kg  10/02/20 116.1 kg  09/17/20 115.2 kg  09/05/20 118.1 kg  05/22/20 118.4 kg  12/09/19 118.5 kg  11/24/19 118.5 kg  11/22/19 118.5 kg  09/22/19 128.4 kg  07/24/17 128.4 kg  05/26/17 127.5 kg   Mason Patton is 75yo cismale with chronic combined systolic and diastolic heart failure, CAD s/p CABG, aortic stenosis s/p AVR, persistent atrial fibrillation on Eliquis, OSA on CPAP, T2DM, COPD, prior CVA presenting to [facility] with dyspnea.  Pt admitted with CHF exacerbation.   Reviewed I/O's: +368 ml x 24 hours  UOP: 725 ml x 24 hours  Spoke with pt and wife at bedside. Per wife, pt generally has a good appetite, however, does not like the hospital food. She shares that they have been trying to make changes in their diet to better control pt's CHF and DM; this has been a difficult transition for him, as pt can be a very selective eater and likes fast food. Wife shares that she has been reading labels and trying to order healthier options when they do go out. Pt typically consumes 2 meals per day (Breakfast: orange juice, cereal, and fruit; Dinner: toast, eggs, and bacon  OR meat, starch, and vegetable). Pt also snacks on vanilla wafers and tootsie rolls throughout the day.   Per pt, he recently had a tooth extraction, but this is not impacting his ability to chew or swallow foods.  Pt denies any weight loss. Per wife, wt has been stable, but recently increased secondary to fluid retention. Reviewed wt hx; pt has experienced a 6.4% wt loss over the past 6 months; suspect this may be related to fluid loss from diuresis.   Nutrition-Focused physical exam completed. Findings are no fat depletion, no muscle depletion, and mild edema.   Reviewed CHF and DM diet guidelines with pt  and wife and discussed importance of self-management. Pt weighs himself daily and was able to teachback to this RD when to contact MD for weight changes.   Medications reviewed and include ferrous sulfate and vitamin B-12.  Lab Results  Component Value Date   HGBA1C 7.4 (H) 09/06/2020   PTA DM medications are 500 mg metformin daily 10 mg glipizide BID.   Labs reviewed: CBGS: 96-135 (inpatient orders for glycemic control are 0-15 units insulin aspart TID with meals).   Body mass index is 35.08 kg/m. Patient meets criteria for obesity, class II based on current BMI.   Current diet order is heart healthy/ carb modified, patient is consuming approximately n/a% of meals at this time. Labs and medications reviewed.   No nutrition interventions warranted at this time. If nutrition issues arise, please consult RD.   Mason Patton, RD, LDN, CDCES Registered Dietitian II Certified Diabetes Care and Education Specialist Please refer to [facility] for RD and/or RD on-call/weekend/after hours pager

## 2020-11-06 NOTE — Discharge Instructions (Signed)

## 2020-11-06 NOTE — Progress Notes (Signed)
Subjective:  HD 1  No acute events overnight.  This morning, patient evaluated at bedside with significant other. He is reporting improvement in his breathing. Per wife, he had a "blood clot" in his mouth this morning. She also notes that he has a decreased appetite. Discussed continuing antibiotics today.   Objective:  Vital signs in last 24 hours: Vitals:   11/06/20 0100 11/06/20 0458 11/06/20 0538 11/06/20 0648  BP:  (!) 172/84 (!) 146/76 (!) 141/84  Pulse:  81 78 74  Resp:  13 (!) 21 (!) 23  Temp: 99.7 F (37.6 C) (!) 101.8 F (38.8 C) 98.8 F (37.1 C) 99.3 F (37.4 C)  TempSrc: Oral Oral Oral Oral  SpO2:  100% 99% 97%  Weight:  110.9 kg    Height:       Physical Exam: General: Chronically ill-appearing, no acute distress HENT: Poor dentition. Blood clot visible on right lower gum at surgical site CV: Regular rate, irregular rhythm. No m/r/g appreciated Pulm: Rhonci appreciated bilaterally. Normal WOB on 3L supp O2 MSK: Bilateral lower extremities with non-pitting edema  CBC Latest Ref Rng & Units 11/05/2020 11/05/2020 11/04/2020  WBC 4.0 - 10.5 K/uL - 7.9 7.8  Hemoglobin 13.0 - 17.0 g/dL 7.6(H) 7.7(L) 7.6(L)  Hematocrit 39.0 - 52.0 % 25.0(L) 26.5(L) 25.5(L)  Platelets 150 - 400 K/uL - 208 192   BMP Latest Ref Rng & Units 11/05/2020 11/05/2020 11/04/2020  Glucose 70 - 99 mg/dL 607(P) 710(G) 78  BUN 8 - 23 mg/dL 14 13 14   Creatinine 0.61 - 1.24 mg/dL 2.69 4.85  BUN/Creat Ratio 10 - 24 - - -  Sodium 135 - 145 mmol/L 134(L) 134(L) 132(L)  Potassium 3.5 - 5.1 mmol/L 3.9 3.8 3.5  Chloride 98 - 111 mmol/L 97(L) 97(L) 98  CO2 22 - 32 mmol/L - 26 26  Calcium 8.9 - 10.3 mg/dL - 8.6(L) 8.2(L)   Assessment/Plan: Mason Patton is 75yo cism[place] with chronic combined systolic and diastolic heart failure, CAD s/p CABG, aortic stenosis s/p AVR, persistent atrial fibrillation on Eliquis, OSA on CPAP, T2DM, COPD, prior CVA admitted 5/1 for acute on chronic HF exacerbation 2/2  pneumonia.  Active Problems:   Dyspnea   Acute exacerbation of CHF (congestive heart failure) (HCC)  #Acute on chronic heart failure exacerbation #Community-acquired pneumonia Overnight patient was febrile to 101.41F, otherwise hemodynamically stable. This AM patient down to 3L supplemental oxygen, reporting improvement of symptoms. On exam, appears to still be somewhat hypervolemic. Given he has continued to have fevers combined with his presenting symptoms will continue with IV antibiotics today for CAP coverage. Most likely this infection preceded his heart failure exacerbation. Will check urine strep antigen today as well. Procal <0.1, but patient was previously taking amoxicillin due to recent tooth extraction therefore offers less clinical value. Plan to give another dose of IV lasix, hopefully will be able to switch to oral medications tomorrow. - C/w rocephin, azithromycin (day 2) - F/u urine strep antigen - IV lasix 40mg  - Strict I&O's - Daily weights - PT/OT  #NSTEMI #CAD s/p CABG #Aortic stenosis s/p AVR Troponin flattened, 108>106 yesterday evening. Denies any new chest pain. NSTEMI still most likely demand related. Will continue home medications. - C/w home aspirin, Eliquis  #Persistent atrial fibrillation No acute changes, continues to be rate controlled. Will continue to hold home medications today given infection, possibly can re-start tomorrow AM. - Holding home coreg - C/w home Eliquis  #S/p tooth extraction Blood clot visible on surgical  site on exam this AM. Patient's wife reports previously was getting saline rinses at home. - Saline rinses q8h - Daily CBC  #Iron deficiency anemia Iron panel consistent with IDA w/ low ferritin, iron. Hgb 7.1 this AM from 7.7 yesterday, likely due to bleeding from oral surgical site in setting of anticoagulation. Giving saline flushes today and monitor CBC. Will give Feraheme today and follow-up CBC in AM. - Feraheme dose today -  F/u CBC  #Hx hypothyroidism TSH low, free T4 elevated. Currently taking Synthroid daily. Will decrease dose in half and have patient follow-up with PCP. - Decrease Synthroid daily  #Hypokalemia K 3.4 likely in setting of decreased po intake. Mg 1.8. Will replete both today. - po potassium - 1g IV Mg - F/u BMP, Mb in AM  #OSA on CPAP - CPAP QHS  DIET: HH IVF: n/a DVT PPX: home Eliquis BOWEL: n/a CODE: FULL FAM COM: Patient's spouse at bedside this AM  Prior to Admission Living Arrangement: Home Anticipated Discharge Location: Pending PT/OT eval Barriers to Discharge: medical management Dispo: Anticipated discharge in approximately 1-2 day(s).   Evlyn Kanner, MD 11/06/2020, 7:16 AM Pager: [phone] After 5pm on weekdays and 1pm on weekends: On Call pager [phone]

## 2020-11-06 NOTE — TOC Initial Note (Addendum)
Transition of Care [facility]) - Initial/Assessment Note    Patient Details  Name: Mason Patton MRN: [id number] Date of Birth: 11-Apr-1946  Transition of Care [facility]) CM/SW Contact:    Leone Haven, RN Phone Number: 11/06/2020, 4:30 PM  Clinical Narrative:                 NCM spoke with patient at bedside, he lives with wife, he has no issues with transportation or medications. He is a Cytogeneticist , goes to [employer], PCP is Dr. Alphonsa Gin fax [phone]. NCM offered choice for HHPT, he states he does not have a preference.  NCM made referral to [facility] with [facility].  He states he can take referral.  Soc will begin 24 to 48 hrs post dc.    Expected Discharge Plan: Home w Home Health Services Barriers to Discharge: Continued Medical Work up   Patient Goals and CMS Choice Patient states their goals for this hospitalization and ongoing recovery are:: return home CMS Medicare.gov Compare Post Acute Care list provided to:: Patient Choice offered to / list presented to : Patient  Expected Discharge Plan and Services Expected Discharge Plan: Home w Home Health Services   Discharge Planning Services: CM Consult Post Acute Care Choice: Home Health Living arrangements for the past 2 months: Single Family Home                   DME Agency: NA       HH Arranged: PT HH Agency: [facility] Date [facility] Agency Contacted: 11/06/20 Time HH Agency Contacted: 1629 Representative spoke with at [facility] Agency: Kandee Keen  Prior Living Arrangements/Services Living arrangements for the past 2 months: Single Family Home Lives with:: Spouse Patient language and need for interpreter reviewed:: Yes Do you feel safe going back to the place where you live?: Yes      Need for Family Participation in Patient Care: Yes (Comment) Care giver support system in place?: Yes (comment) Current home services: DME (has walker and a cane) Criminal Activity/Legal Involvement Pertinent to Current  Situation/Hospitalization: No - Comment as needed  Activities of Daily Living Home Assistive Devices/Equipment: Walker (specify type) ADL Screening (condition at time of admission) Patient's cognitive ability adequate to safely complete daily activities?: Yes Is the patient deaf or have difficulty hearing?: No Does the patient have difficulty seeing, even when wearing glasses/contacts?: No Does the patient have difficulty concentrating, remembering, or making decisions?: No Patient able to express need for assistance with ADLs?: No Does the patient have difficulty dressing or bathing?: No Independently performs ADLs?: No Communication: Independent with device (comment) Dressing (OT): Independent with device (comment),Needs assistance Grooming: Independent with device (comment),Needs assistance Feeding: Independent with device (comment),Needs assistance Bathing: Needs assistance Toileting: Needs assistance In/Out Bed: Needs assistance Walks in Home: Needs assistance Does the patient have difficulty walking or climbing stairs?: Yes Weakness of Legs: Both Weakness of Arms/Hands: Both  Permission Sought/Granted                  Emotional Assessment Appearance:: Appears stated age Attitude/Demeanor/Rapport: Engaged Affect (typically observed): Appropriate Orientation: : Oriented to Self,Oriented to Place,Oriented to  Time,Oriented to Situation Alcohol / Substance Use: Not Applicable Psych Involvement: No (comment)  Admission diagnosis:  Dyspnea [R06.00] Acute exacerbation of CHF (congestive heart failure) (HCC) [I50.9] Fever, unspecified fever cause [R50.9] Dyspnea, unspecified type [R06.00] Acute CHF (HCC) [I50.9] Patient Active Problem List   Diagnosis Date Noted  . Acute CHF (HCC) 11/06/2020  .  Dyspnea 11/05/2020  . Acute exacerbation of CHF (congestive heart failure) (HCC) 11/05/2020  . Coronary artery disease 09/11/2020  . Aortic stenosis, severe 09/07/2020  .  Unstable angina (HCC) 09/06/2020  . Arthritis of right knee 11/24/2019  . Unilateral primary osteoarthritis, right knee   . Uncontrolled type 2 diabetes mellitus with peripheral neuropathy (HCC) 03/27/2015  . 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 CVA (cerebral infarction) 11/16/2013  . Dizziness 11/15/2013  . Left sided lacunar infarction (HCC) 11/15/2013  . Lacunar infarction (HCC) 06/01/2013  . Acute, but ill-defined, cerebrovascular disease 06/01/2013  . 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 Acute CVA (cerebrovascular accident) (HCC) 03/29/2013  . Numbness and tingling 03/27/2013  . Hyperglycemia 03/26/2013  . Meningitis 03/25/2013  . Encephalopathy acute 03/23/2013  . Fever 03/23/2013  . OSA on CPAP 03/23/2013  . Obesity 03/23/2013  . Murmur 03/16/2013  . Preoperative cardiovascular examination 03/16/2013  . Dyslipidemia 03/16/2013  . Hypertension   . Cholecystitis with cholelithiasis   . Apnea, sleep 01/23/2010   PCP:  Roger Kill, PA-C Pharmacy:   [id number] - [place], [place] - [address] [place] [address] Phone: [phone] Fax: [phone]  [facility] - [place], [place] - [address] [place] [address] Phone: [phone] Fax: [phone]     Social Determinants of Health (SDOH) Interventions    Readmission Risk Interventions Readmission Risk Prevention Plan 11/06/2020  Transportation Screening Complete  PCP or Specialist Appt within 3-5 Days Complete  HRI or Home Care Consult Complete  Social Work Consult for Recovery Care Planning/Counseling Complete  Palliative Care Screening Not Applicable  Medication Review Oceanographer) Complete  Some recent data might be hidden

## 2020-11-06 NOTE — Evaluation (Signed)
Physical Therapy Evaluation Patient Details Name: Mason Patton MRN: [id number] DOB: 1946/07/02 Today's Date: 11/06/2020   History of Present Illness  Pt is a 75 y.o. male admitted 11/05/20 with dypsnea, AMS, generalized weakness, weight gain. Workup for CHF exacerbation, CAP, NSTEMI. PMH includes HF, CAD s/p CABG, aortic stenosis s/p AVR, afib on Eliquis, OSA on CPAP, DM2, COPD, CVA (2014), bilateral TKA (L 2014, R 2021).    Clinical Impression  Pt presents with an overall decrease in functional mobility secondary to above. PTA, pt independent with ambulation and ADLs, wife only assists with lower body dressing. Today, pt requiring up to [facility] for bed mobility, ambulating short distance with RW and min guard; pt with DOE 3/4, reports limited by fatigue and SOB; pt denies dizziness. Called wife at end of session to discuss d/c recommendations, current mobility level and assist/DME needs; wife in agreement. Pt would benefit from continued acute PT services to maximize functional mobility and independence prior to d/c with HHPT services.   SpO2 94-98% on RA, DOE 3/4 with activity HR 80s-90s    Follow Up Recommendations Home health PT;Supervision for mobility/OOB    Equipment Recommendations  None recommended by PT    Recommendations for Other Services       Precautions / Restrictions Precautions Precautions: Fall Restrictions Weight Bearing Restrictions: No      Mobility  Bed Mobility Overal bed mobility: Needs Assistance Bed Mobility: Supine to Sit     Supine to sit: Mod assist;HOB elevated     General bed mobility comments: Able to come to sit EOB with min-modA for HHA to elevate trunk, difficulty maintaining seated balance requiring modA to scoot hips forwards, looking for UE support from rails/EOB/RW    Transfers Overall transfer level: Needs assistance Equipment used: Rolling walker (2 wheeled) Transfers: Sit to/from Stand Sit to Stand: Min guard         General  transfer comment: Cues for hand placement, min guard for balance; poor eccentric control into sitting despite verbal cues  Ambulation/Gait Ambulation/Gait assistance: Min guard Gait Distance (Feet): 14 Feet Assistive device: Rolling walker (2 wheeled) Gait Pattern/deviations: Shuffle;Step-through pattern;Decreased stride length;Trunk flexed Gait velocity: Decreased   General Gait Details: Initial slow, shuffling gait with forward flexed postures, able to increase step length with cues; min guard for balance; verbal cues to stay on task walking to recliner; DOE 2-3/4. Pt declined further mobility secondary to fatigue  Stairs            Wheelchair Mobility    Modified Rankin (Stroke Patients Only)       Balance Overall balance assessment: Needs assistance Sitting-balance support: Single extremity supported;Bilateral upper extremity supported;No upper extremity supported;Feet supported Sitting balance-Leahy Scale: Poor Sitting balance - Comments: Requiring frequent minA to maintain static sitting balance or UE support   Standing balance support: Bilateral upper extremity supported;During functional activity Standing balance-Leahy Scale: Poor Standing balance comment: Reliant on UE support                             Pertinent Vitals/Pain Pain Assessment: No/denies pain    Home Living Family/patient expects to be discharged to:: Private residence Living Arrangements: Spouse/significant other Available Help at Discharge: Family;Available 24 hours/day Type of Home: House Home Access: Stairs to enter Entrance Stairs-Rails: Right;Left;Can reach both Entrance Stairs-Number of Steps: 3 Home Layout: One level Home Equipment: Cane - single point;Walker - 2 wheels;Grab bars - tub/shower;Shower seat  Prior Function Level of Independence: Needs assistance   Gait / Transfers Assistance Needed: Indep with ambulation without DME  ADL's / Homemaking Assistance  Needed: Indep for majority of ADLs, although wife assists with lower body dressing  Comments: Does not wear O2 at home. Wife reports pt was about to start working with outpatient cardiac rehab, but this had not been scheduled yet     Hand Dominance        Extremity/Trunk Assessment   Upper Extremity Assessment Upper Extremity Assessment: Overall WFL for tasks assessed    Lower Extremity Assessment Lower Extremity Assessment: Generalized weakness       Communication   Communication: HOH  Cognition Arousal/Alertness: Awake/alert Behavior During Therapy: WFL for tasks assessed/performed;Flat affect Overall Cognitive Status: No family/caregiver present to determine baseline cognitive functioning Area of Impairment: Attention;Following commands;Safety/judgement;Awareness;Problem solving                   Current Attention Level: Sustained;Selective   Following Commands: Follows one step commands with increased time Safety/Judgement: Decreased awareness of deficits Awareness: Emergent Problem Solving: Slow processing;Decreased initiation;Requires verbal cues General Comments: Pt becoming quiet during activity and not consistently answering questions, following directions with increased time; when asked about this, pt not giving straight answer, later reports due to fatigue. (Wife present at beginning of session but leaving before verifying prior cognition). Poor attention when looking on phone to call wife, required cues to reattend to task (started watching TV)      General Comments General comments (skin integrity, edema, etc.): HR 80s-90s, SpO2 94-98% on RA. Called wife Dois Davenport) at end of session to discussed current mobility status and d/c recommendations; wife agreeable to HHPT services. Discussed recommendation for pt to use RW upon initial return home    Exercises     Assessment/Plan    PT Assessment Patient needs continued PT services  PT Problem List Decreased  strength;Decreased activity tolerance;Decreased balance;Decreased mobility;Decreased cognition;Cardiopulmonary status limiting activity       PT Treatment Interventions DME instruction;Gait training;Stair training;Functional mobility training;Therapeutic activities;Therapeutic exercise;Balance training;Patient/family education;Cognitive remediation    PT Goals (Current goals can be found in the Care Plan section)  Acute Rehab PT Goals Patient Stated Goal: Return home PT Goal Formulation: With patient/family Time For Goal Achievement: 11/20/20 Potential to Achieve Goals: Good    Frequency Min 3X/week   Barriers to discharge        Co-evaluation               AM-PAC PT "6 Clicks" Mobility  Outcome Measure Help needed turning from your back to your side while in a flat bed without using bedrails?: A Little Help needed moving from lying on your back to sitting on the side of a flat bed without using bedrails?: A Little Help needed moving to and from a bed to a chair (including a wheelchair)?: A Little Help needed standing up from a chair using your arms (e.g., wheelchair or bedside chair)?: A Little Help needed to walk in hospital room?: A Little Help needed climbing 3-5 steps with a railing? : A Little 6 Click Score: 18    End of Session Equipment Utilized During Treatment: Gait belt Activity Tolerance: Patient tolerated treatment well;Patient limited by fatigue Patient left: in chair;with call bell/phone within reach;with chair alarm set Nurse Communication: Mobility status PT Visit Diagnosis: Other abnormalities of gait and mobility (R26.89);Muscle weakness (generalized) (M62.81)    Time: 1400-1446 PT Time Calculation (min) (ACUTE ONLY): 46 min   Charges:  PT Evaluation $PT Eval Moderate Complexity: 1 Mod PT Treatments $Therapeutic Activity: 8-22 mins $Self Care/Home Management: 8-22   Ina Homes, PT, DPT Acute Rehabilitation Services  Pager  [phone] Office [phone]  Malachy Chamber 11/06/2020, 3:25 PM

## 2020-11-07 ENCOUNTER — Other Ambulatory Visit: Payer: Self-pay | Admitting: Student

## 2020-11-07 DIAGNOSIS — I4819 Other persistent atrial fibrillation: Secondary | ICD-10-CM

## 2020-11-07 DIAGNOSIS — I5043 Acute on chronic combined systolic (congestive) and diastolic (congestive) heart failure: Secondary | ICD-10-CM

## 2020-11-07 DIAGNOSIS — E039 Hypothyroidism, unspecified: Secondary | ICD-10-CM

## 2020-11-07 DIAGNOSIS — D500 Iron deficiency anemia secondary to blood loss (chronic): Secondary | ICD-10-CM

## 2020-11-07 DIAGNOSIS — I214 Non-ST elevation (NSTEMI) myocardial infarction: Principal | ICD-10-CM

## 2020-11-07 LAB — BASIC METABOLIC PANEL
Anion gap: 9 (ref 5–15)
BUN: 12 mg/dL (ref 8–23)
CO2: 27 mmol/L (ref 22–32)
Calcium: 8.8 mg/dL — ABNORMAL LOW (ref 8.9–10.3)
Chloride: 99 mmol/L (ref 98–111)
Creatinine, Ser: 0.83 mg/dL (ref 0.61–1.24)
GFR, Estimated: >60 mL/min (ref >60)
Glucose, Bld: 107 mg/dL — ABNORMAL HIGH (ref 70–99)
Potassium: 3.9 mmol/L (ref 3.5–5.1)
Sodium: 135 mmol/L (ref 135–145)

## 2020-11-07 LAB — CBC
HCT: 25.7 % — ABNORMAL LOW (ref 39.0–52.0)
Hemoglobin: 7.7 g/dL — ABNORMAL LOW (ref 13.0–17.0)
MCH: 21.9 pg — ABNORMAL LOW (ref 26.0–34.0)
MCHC: 30 g/dL (ref 30.0–36.0)
MCV: 73 fL — ABNORMAL LOW (ref 80.0–100.0)
Platelets: 186 10*3/uL (ref 150–400)
RBC: 3.52 MIL/uL — ABNORMAL LOW (ref 4.22–5.81)
RDW: 17.6 % — ABNORMAL HIGH (ref 11.5–15.5)
WBC: 7.2 10*3/uL (ref 4.0–10.5)
nRBC: 0 % (ref 0.0–0.2)

## 2020-11-07 LAB — GLUCOSE, CAPILLARY
Glucose-Capillary: 104 mg/dL — ABNORMAL HIGH (ref 70–99)
Glucose-Capillary: 183 mg/dL — ABNORMAL HIGH (ref 70–99)

## 2020-11-07 MED ORDER — AZITHROMYCIN 500 MG PO TABS: 500.0000 mg | ORAL_TABLET | Freq: Every day | ORAL | 0 refills | Status: DC

## 2020-11-07 MED ORDER — BENZONATATE 100 MG PO CAPS: 100.0000 mg | ORAL_CAPSULE | Freq: Three times a day (TID) | ORAL | 0 refills | Status: DC | PRN

## 2020-11-07 MED ORDER — BENZONATATE 100 MG PO CAPS: 100.0000 mg | ORAL_CAPSULE | Freq: Three times a day (TID) | ORAL | 0 refills | Status: AC | PRN

## 2020-11-07 MED ORDER — LEVOTHYROXINE SODIUM 200 MCG PO TABS: 200.0000 ug | ORAL_TABLET | Freq: Every day | ORAL | 0 refills | Status: DC

## 2020-11-07 MED ORDER — LEVOTHYROXINE SODIUM 200 MCG PO TABS: 200.0000 ug | ORAL_TABLET | Freq: Every day | ORAL | 0 refills | Status: AC

## 2020-11-07 NOTE — TOC Transition Note (Addendum)
Transition of Care [facility]) - CM/SW Discharge Note   Patient Details  Name: Mason Patton MRN: [id number] Date of Birth: 1946-05-20  Transition [facility]) CM/SW Contact:  Leone Haven, RN Phone Number: 11/07/2020, 10:19 AM   Clinical Narrative:    Patient is for dc today, he is set up with [facility], it is on the AVS.  He has not further needs.  NCM faxed orders to patient PCP at [facility] office, will faxed dc summary when dc summary is ready.   Final next level of care: Home w Home Health Services Barriers to Discharge: No Barriers Identified   Patient Goals and CMS Choice Patient states their goals for this hospitalization and ongoing recovery are:: return home CMS Medicare.gov Compare Post Acute Care list provided to:: Patient Choice offered to / list presented to : Patient  Discharge Placement                       Discharge Plan and Services   Discharge Planning Services: CM Consult Post Acute Care Choice: Home Health            DME Agency: NA       HH Arranged: PT HH Agency: [facility] Date [facility] Agency Contacted: 11/06/20 Time HH Agency Contacted: 1629 Representative spoke with at [facility] Agency: Kandee Keen  Social Determinants of Health (SDOH) Interventions     Readmission Risk Interventions Readmission Risk Prevention Plan 11/06/2020  Transportation Screening Complete  PCP or Specialist Appt within 3-5 Days Complete  HRI or Home Care Consult Complete  Social Work Consult for Recovery Care Planning/Counseling Complete  Palliative Care Screening Not Applicable  Medication Review Oceanographer) Complete  Some recent data might be hidden

## 2020-11-07 NOTE — Significant Event (Signed)
Patient desats with 02 off transitioning from C pap to Nasal cannula refused to eat at this time. Asked for a chocolate ensure.

## 2020-11-07 NOTE — Progress Notes (Signed)
Physical Therapy Treatment Patient Details Name: Mason Patton MRN: [id number] DOB: 1945/09/21 Today's Date: 11/07/2020    History of Present Illness Pt is a 75 y.o. male admitted 11/05/20 with dypsnea, AMS, generalized weakness, weight gain. Workup for CHF exacerbation, CAP, NSTEMI. PMH includes HF, CAD s/p CABG, aortic stenosis s/p AVR, afib on Eliquis, OSA on CPAP, DM2, COPD, CVA (2014), bilateral TKA (L 2014, R 2021).   PT Comments    Pt progressing well with mobility. Pt tolerated increased activity this session in-room, performing ADL tasks at sink and short ambulation distance with RW, requiring up to minA for mobility. Pt hopeful for d/c home today; continue to recommend follow-up with HHPT services to maximize functional mobility and independence. Will continue to follow acutely.   Follow Up Recommendations  Home health PT;Supervision for mobility/OOB     Equipment Recommendations  None recommended by PT    Recommendations for Other Services       Precautions / Restrictions Precautions Precautions: Fall Restrictions Weight Bearing Restrictions: No    Mobility  Bed Mobility Overal bed mobility: Needs Assistance Bed Mobility: Supine to Sit     Supine to sit: Mod assist;HOB elevated     General bed mobility comments: ModA for HHA to elevate trunk; improved ability to get hips situated at EOB and scooting forward    Transfers Overall transfer level: Needs assistance Equipment used: Rolling walker (2 wheeled) Transfers: Sit to/from Stand Sit to Stand: Min assist         General transfer comment: Reliant on momentum to power into standing, cues for hand placement, light minA for trunk elevaiton and stability  Ambulation/Gait Ambulation/Gait assistance: Min guard Gait Distance (Feet): 30 Feet Assistive device: Rolling walker (2 wheeled) Gait Pattern/deviations: Step-through pattern;Decreased stride length;Trunk flexed Gait velocity: Decreased   General Gait  Details: Slow, steady gait with RW and intermittent min guard for balance, stability improved from prior session; distance limited due to fatigue after prolonged standing ADL tasks at sink, as well as arrival of MDs; DOE 2/4, SpO2 >/94% on RA   Stairs             Wheelchair Mobility    Modified Rankin (Stroke Patients Only)       Balance Overall balance assessment: Needs assistance Sitting-balance support: Single extremity supported;Bilateral upper extremity supported;No upper extremity supported;Feet supported Sitting balance-Leahy Scale: Fair       Standing balance-Leahy Scale: Fair Standing balance comment: Can static stand to perform ADL tasks at sink without UE support; dynamic stability improved with UE support                            Cognition Arousal/Alertness: Awake/alert Behavior During Therapy: WFL for tasks assessed/performed Overall Cognitive Status: No family/caregiver present to determine baseline cognitive functioning Area of Impairment: Attention;Following commands;Safety/judgement;Awareness;Problem solving                   Current Attention Level: Selective   Following Commands: Follows one step commands consistently;Follows one step commands with increased time Safety/Judgement: Decreased awareness of deficits Awareness: Emergent Problem Solving: Slow processing;Decreased initiation;Requires verbal cues General Comments: Improving affect and cognition this session. Still requiring increased time to complete tasks with verbal cues to stay on task      Exercises      General Comments General comments (skin integrity, edema, etc.): DOE 2/4, SpO2 >/94% on RA      Pertinent Vitals/Pain Pain Assessment: No/denies  pain    Home Living                      Prior Function            PT Goals (current goals can now be found in the care plan section) Progress towards PT goals: Progressing toward goals     Frequency    Min 3X/week      PT Plan Current plan remains appropriate    Co-evaluation              AM-PAC PT "6 Clicks" Mobility   Outcome Measure  Help needed turning from your back to your side while in a flat bed without using bedrails?: None Help needed moving from lying on your back to sitting on the side of a flat bed without using bedrails?: A Lot Help needed moving to and from a bed to a chair (including a wheelchair)?: A Little Help needed standing up from a chair using your arms (e.g., wheelchair or bedside chair)?: A Little Help needed to walk in hospital room?: A Little Help needed climbing 3-5 steps with a railing? : A Little 6 Click Score: 18    End of Session Equipment Utilized During Treatment: Gait belt Activity Tolerance: Patient tolerated treatment well Patient left: in chair;with call bell/phone within reach;with chair alarm set Nurse Communication: Mobility status PT Visit Diagnosis: Other abnormalities of gait and mobility (R26.89);Muscle weakness (generalized) (M62.81)     Time: 2130-8657 PT Time Calculation (min) (ACUTE ONLY): 20 min  Charges:  $Therapeutic Activity: 8-22 mins                     Mason Patton, PT, DPT Acute Rehabilitation Services  Pager [phone] Office [phone]  Mason Patton 11/07/2020, 10:23 AM

## 2020-11-07 NOTE — Plan of Care (Signed)

## 2020-11-07 NOTE — Evaluation (Signed)
Occupational Therapy Evaluation Patient Details Name: Mason Patton MRN: [id number] DOB: 1945-12-31 Today's Date: 11/07/2020    History of Present Illness Pt is a 75 y.o. male admitted 11/05/20 with dypsnea, AMS, generalized weakness, weight gain. Workup for CHF exacerbation, CAP, NSTEMI. PMH includes HF, CAD s/p CABG, aortic stenosis s/p AVR, afib on Eliquis, OSA on CPAP, DM2, COPD, CVA (2014), bilateral TKA (L 2014, R 2021).   Clinical Impression   Pt presents with decline in function and safety with ADLs and ADL mobility with impaired strength, balance, endurance and cognition. PTA pt lived at home with his wife and was not using an AD for mobility but furniture walked, Ind with most ADLs with some assist for LB selfcare from his wife. Pt currently requires mod A with LB ADLs, mi guard a with grooming standing at sink, min A with mobility using RW with decreased safety awareness. Pt would benefit from acute OT services to address impairments to maximize level of function and safety. Pt hopeful to return home today.    Follow Up Recommendations  Home health OT;Supervision/Assistance - 24 hour    Equipment Recommendations  None recommended by OT    Recommendations for Other Services       Precautions / Restrictions Precautions Precautions: Fall Restrictions Weight Bearing Restrictions: No      Mobility Bed Mobility Overal bed mobility: Needs Assistance Bed Mobility: Sit to Supine     Supine to sit: Mod assist;HOB elevated Sit to supine: Supervision;Min guard   General bed mobility comments: min guard A with LEs back onto bed. min verbal cues for scootig to [facility] and positioning    Transfers Overall transfer level: Needs assistance Equipment used: Rolling walker (2 wheeled) Transfers: Sit to/from Stand Sit to Stand: Min assist         General transfer comment: Reliant on momentum to power into standing, cues for hand placement, light minA for trunk elevaiton and  stability    Balance Overall balance assessment: Needs assistance Sitting-balance support: No upper extremity supported;Feet supported Sitting balance-Leahy Scale: Fair     Standing balance support: Bilateral upper extremity supported;During functional activity Standing balance-Leahy Scale: Fair Standing balance comment: Can static stand to perform ADL tasks at sink without UE support; dynamic stability improved with UE support                           ADL either performed or assessed with clinical judgement   ADL Overall ADL's : Needs assistance/impaired Eating/Feeding: Set up;Independent;Sitting   Grooming: Wash/dry hands;Wash/dry face;Min guard;Standing   Upper Body Bathing: Supervision/ safety;Set up;Sitting   Lower Body Bathing: Moderate assistance;With caregiver independent assisting   Upper Body Dressing : Supervision/safety;Set up;Sitting;With caregiver independent assisting   Lower Body Dressing: Moderate assistance;With caregiver independent assisting   Toilet Transfer: Minimal assistance;Ambulation;RW;Cueing for safety   Toileting- Clothing Manipulation and Hygiene: Minimal assistance;Sit to/from stand;With caregiver independent assisting       Functional mobility during ADLs: Minimal assistance;Rolling walker;Cueing for safety General ADL Comments: pt's wife present and will be able to assist pt 24/7 as needed     Vision Baseline Vision/History: Wears glasses Patient Visual Report: No change from baseline       Perception     Praxis      Pertinent Vitals/Pain Pain Assessment: No/denies pain     Hand Dominance Right   Extremity/Trunk Assessment Upper Extremity Assessment Upper Extremity Assessment: Generalized weakness   Lower Extremity Assessment Lower  Extremity Assessment: Defer to PT evaluation       Communication Communication Communication: HOH   Cognition Arousal/Alertness: Awake/alert Behavior During Therapy: WFL for  tasks assessed/performed;Flat affect Overall Cognitive Status: Impaired/Different from baseline Area of Impairment: Memory;Following commands;Safety/judgement;Awareness;Problem solving                   Current Attention Level: Selective   Following Commands: Follows one step commands consistently;Follows one step commands with increased time Safety/Judgement: Decreased awareness of deficits Awareness: Emergent Problem Solving: Slow processing;Decreased initiation;Requires verbal cues General Comments: Improving affect and cognition this session. Still requiring increased time to complete tasks with verbal cues to stay on task   General Comments  DOE 2/4, SpO2 >/94% on RA    Exercises     Shoulder Instructions      Home Living Family/patient expects to be discharged to:: Private residence Living Arrangements: Spouse/significant other Available Help at Discharge: Family;Available 24 hours/day Type of Home: House Home Access: Stairs to enter [employer] of Steps: 3 Entrance Stairs-Rails: Right;Left;Can reach both Home Layout: One level     Bathroom Shower/Tub: Producer, television/film/video: Handicapped height     Home Equipment: Cane - single point;Walker - 2 wheels;Grab bars - tub/shower;Shower seat          Prior Functioning/Environment Level of Independence: Needs assistance  Gait / Transfers Assistance Needed: Indep with ambulation without DME ADL's / Homemaking Assistance Needed: Indep for majority of ADLs, although wife assists with lower body dressing   Comments: Does not wear O2 at home. Wife reports pt was about to start working with outpatient cardiac rehab, but this had not been scheduled yet        OT Problem List: Decreased strength;Impaired balance (sitting and/or standing);Decreased cognition;Decreased safety awareness;Decreased activity tolerance;Decreased knowledge of use of DME or AE      OT Treatment/Interventions:  Self-care/ADL training;Patient/family education;Therapeutic activities;DME and/or AE instruction    OT Goals(Current goals can be found in the care plan section) Acute Rehab OT Goals Patient Stated Goal: Return home OT Goal Formulation: With patient/family Time For Goal Achievement: 11/21/20 Potential to Achieve Goals: Good ADL Goals Pt Will Perform Grooming: with supervision;with set-up;with caregiver independent in assisting;standing Pt Will Perform Lower Body Bathing: with min assist;with min guard assist;with caregiver independent in assisting Pt Will Perform Lower Body Dressing: with min assist;with min guard assist;with caregiver independent in assisting Pt Will Transfer to Toilet: with min guard assist;with supervision;ambulating Pt Will Perform Toileting - Clothing Manipulation and hygiene: with min guard assist;with supervision;sit to/from stand  OT Frequency: Min 2X/week   Barriers to D/C:            Co-evaluation              AM-PAC OT "6 Clicks" Daily Activity     Outcome Measure Help from another person eating meals?: None Help from another person taking care of personal grooming?: A Little Help from another person toileting, which includes using toliet, bedpan, or urinal?: A Little Help from another person bathing (including washing, rinsing, drying)?: A Little Help from another person to put on and taking off regular upper body clothing?: A Little Help from another person to put on and taking off regular lower body clothing?: A Little 6 Click Score: 19   End of Session Equipment Utilized During Treatment: Gait belt;Rolling walker  Activity Tolerance: Patient limited by fatigue Patient left: in bed;with call bell/phone within reach;with bed alarm set;with family/visitor present  OT  Visit Diagnosis: Unsteadiness on feet (R26.81);Other abnormalities of gait and mobility (R26.89);Muscle weakness (generalized) (M62.81);Other symptoms and signs involving cognitive  function                Time: 7793-9030 OT Time Calculation (min): 24 min Charges:  OT General Charges $OT Visit: 1 Visit OT Evaluation $OT Eval Moderate Complexity: 1 Mod OT Treatments $Self Care/Home Management : 8-22 mins    Margaretmary Eddy [facility] 11/07/2020, 12:01 PM

## 2020-11-08 ENCOUNTER — Emergency Department (HOSPITAL_COMMUNITY): Payer: No Typology Code available for payment source

## 2020-11-08 ENCOUNTER — Inpatient Hospital Stay (HOSPITAL_COMMUNITY): Payer: No Typology Code available for payment source

## 2020-11-08 ENCOUNTER — Inpatient Hospital Stay (HOSPITAL_COMMUNITY)
Admission: EM | Admit: 2020-11-08 | Discharge: 2020-11-16 | DRG: 871 | Disposition: A | Discharge status: Home Health | Payer: No Typology Code available for payment source | Attending: Internal Medicine | Admitting: Internal Medicine

## 2020-11-08 ENCOUNTER — Other Ambulatory Visit: Payer: Self-pay

## 2020-11-08 ENCOUNTER — Encounter (HOSPITAL_COMMUNITY): Payer: Self-pay | Admitting: Internal Medicine

## 2020-11-08 DIAGNOSIS — I5042 Chronic combined systolic (congestive) and diastolic (congestive) heart failure: Secondary | ICD-10-CM | POA: Diagnosis present

## 2020-11-08 DIAGNOSIS — J9601 Acute respiratory failure with hypoxia: Secondary | ICD-10-CM | POA: Diagnosis present

## 2020-11-08 DIAGNOSIS — Z6832 Body mass index (BMI) 32.0-32.9, adult: Secondary | ICD-10-CM | POA: Diagnosis not present

## 2020-11-08 DIAGNOSIS — E1142 Type 2 diabetes mellitus with diabetic polyneuropathy: Secondary | ICD-10-CM | POA: Diagnosis present

## 2020-11-08 DIAGNOSIS — D509 Iron deficiency anemia, unspecified: Secondary | ICD-10-CM | POA: Diagnosis present

## 2020-11-08 DIAGNOSIS — Z951 Presence of aortocoronary bypass graft: Secondary | ICD-10-CM

## 2020-11-08 DIAGNOSIS — Z7901 Long term (current) use of anticoagulants: Secondary | ICD-10-CM

## 2020-11-08 DIAGNOSIS — Z20822 Contact with and (suspected) exposure to covid-19: Secondary | ICD-10-CM | POA: Diagnosis present

## 2020-11-08 DIAGNOSIS — Z885 Allergy status to narcotic agent status: Secondary | ICD-10-CM | POA: Diagnosis not present

## 2020-11-08 DIAGNOSIS — I110 Hypertensive heart disease with heart failure: Secondary | ICD-10-CM | POA: Diagnosis present

## 2020-11-08 DIAGNOSIS — A419 Sepsis, unspecified organism: Secondary | ICD-10-CM | POA: Diagnosis not present

## 2020-11-08 DIAGNOSIS — Z833 Family history of diabetes mellitus: Secondary | ICD-10-CM

## 2020-11-08 DIAGNOSIS — G3184 Mild cognitive impairment, so stated: Secondary | ICD-10-CM | POA: Diagnosis not present

## 2020-11-08 DIAGNOSIS — Z9981 Dependence on supplemental oxygen: Secondary | ICD-10-CM | POA: Diagnosis not present

## 2020-11-08 DIAGNOSIS — R0600 Dyspnea, unspecified: Secondary | ICD-10-CM | POA: Diagnosis not present

## 2020-11-08 DIAGNOSIS — Z8349 Family history of other endocrine, nutritional and metabolic diseases: Secondary | ICD-10-CM

## 2020-11-08 DIAGNOSIS — I340 Nonrheumatic mitral (valve) insufficiency: Secondary | ICD-10-CM | POA: Diagnosis not present

## 2020-11-08 DIAGNOSIS — Z7984 Long term (current) use of oral hypoglycemic drugs: Secondary | ICD-10-CM

## 2020-11-08 DIAGNOSIS — I2510 Atherosclerotic heart disease of native coronary artery without angina pectoris: Secondary | ICD-10-CM | POA: Diagnosis present

## 2020-11-08 DIAGNOSIS — Z953 Presence of xenogenic heart valve: Secondary | ICD-10-CM | POA: Diagnosis not present

## 2020-11-08 DIAGNOSIS — I358 Other nonrheumatic aortic valve disorders: Secondary | ICD-10-CM

## 2020-11-08 DIAGNOSIS — B957 Other staphylococcus as the cause of diseases classified elsewhere: Secondary | ICD-10-CM | POA: Diagnosis present

## 2020-11-08 DIAGNOSIS — J439 Emphysema, unspecified: Secondary | ICD-10-CM | POA: Diagnosis present

## 2020-11-08 DIAGNOSIS — R7881 Bacteremia: Secondary | ICD-10-CM | POA: Diagnosis not present

## 2020-11-08 DIAGNOSIS — E890 Postprocedural hypothyroidism: Secondary | ICD-10-CM | POA: Diagnosis not present

## 2020-11-08 DIAGNOSIS — E1165 Type 2 diabetes mellitus with hyperglycemia: Secondary | ICD-10-CM | POA: Diagnosis present

## 2020-11-08 DIAGNOSIS — J9811 Atelectasis: Secondary | ICD-10-CM | POA: Diagnosis present

## 2020-11-08 DIAGNOSIS — Z8673 Personal history of transient ischemic attack (TIA), and cerebral infarction without residual deficits: Secondary | ICD-10-CM

## 2020-11-08 DIAGNOSIS — A412 Sepsis due to unspecified staphylococcus: Secondary | ICD-10-CM | POA: Diagnosis present

## 2020-11-08 DIAGNOSIS — J189 Pneumonia, unspecified organism: Secondary | ICD-10-CM | POA: Insufficient documentation

## 2020-11-08 DIAGNOSIS — G4733 Obstructive sleep apnea (adult) (pediatric): Secondary | ICD-10-CM | POA: Diagnosis present

## 2020-11-08 DIAGNOSIS — E785 Hyperlipidemia, unspecified: Secondary | ICD-10-CM | POA: Diagnosis present

## 2020-11-08 DIAGNOSIS — Z79899 Other long term (current) drug therapy: Secondary | ICD-10-CM

## 2020-11-08 DIAGNOSIS — W06XXXA Fall from bed, initial encounter: Secondary | ICD-10-CM | POA: Diagnosis present

## 2020-11-08 DIAGNOSIS — F419 Anxiety disorder, unspecified: Secondary | ICD-10-CM | POA: Diagnosis present

## 2020-11-08 DIAGNOSIS — Z79890 Hormone replacement therapy (postmenopausal): Secondary | ICD-10-CM

## 2020-11-08 DIAGNOSIS — I4819 Other persistent atrial fibrillation: Secondary | ICD-10-CM | POA: Diagnosis present

## 2020-11-08 DIAGNOSIS — R509 Fever, unspecified: Secondary | ICD-10-CM | POA: Diagnosis not present

## 2020-11-08 DIAGNOSIS — F05 Delirium due to known physiological condition: Secondary | ICD-10-CM | POA: Diagnosis not present

## 2020-11-08 DIAGNOSIS — IMO0002 Reserved for concepts with insufficient information to code with codable children: Secondary | ICD-10-CM | POA: Diagnosis present

## 2020-11-08 DIAGNOSIS — F17210 Nicotine dependence, cigarettes, uncomplicated: Secondary | ICD-10-CM | POA: Diagnosis present

## 2020-11-08 DIAGNOSIS — E039 Hypothyroidism, unspecified: Secondary | ICD-10-CM | POA: Diagnosis not present

## 2020-11-08 DIAGNOSIS — I10 Essential (primary) hypertension: Secondary | ICD-10-CM | POA: Diagnosis present

## 2020-11-08 DIAGNOSIS — K219 Gastro-esophageal reflux disease without esophagitis: Secondary | ICD-10-CM | POA: Diagnosis present

## 2020-11-08 DIAGNOSIS — R011 Cardiac murmur, unspecified: Secondary | ICD-10-CM | POA: Diagnosis present

## 2020-11-08 DIAGNOSIS — E876 Hypokalemia: Secondary | ICD-10-CM | POA: Diagnosis present

## 2020-11-08 DIAGNOSIS — Z9989 Dependence on other enabling machines and devices: Secondary | ICD-10-CM

## 2020-11-08 DIAGNOSIS — Z825 Family history of asthma and other chronic lower respiratory diseases: Secondary | ICD-10-CM

## 2020-11-08 LAB — ECHOCARDIOGRAM COMPLETE
AR max vel: 2.48 cm2
AV Area VTI: 2.38 cm2
AV Area mean vel: 2.28 cm2
AV Mean grad: 10 mmHg
AV Peak grad: 21.2 mmHg
Ao pk vel: 2.3 m/s
Area-P 1/2: 2.66 cm2
Height: 70 in
S' Lateral: 4.4 cm
Single Plane A4C EF: 48.3 %
Weight: 3824 oz

## 2020-11-08 LAB — GLUCOSE, CAPILLARY
Glucose-Capillary: 89 mg/dL (ref 70–99)
Glucose-Capillary: 116 mg/dL — ABNORMAL HIGH (ref 70–99)
Glucose-Capillary: 91 mg/dL (ref 70–99)

## 2020-11-08 LAB — COMPREHENSIVE METABOLIC PANEL
ALT: 24 U/L (ref 0–44)
AST: 28 U/L (ref 15–41)
Albumin: 3.2 g/dL — ABNORMAL LOW (ref 3.5–5.0)
Alkaline Phosphatase: 73 U/L (ref 38–126)
Anion gap: 10 (ref 5–15)
BUN: 11 mg/dL (ref 8–23)
CO2: 26 mmol/L (ref 22–32)
Calcium: 9 mg/dL (ref 8.9–10.3)
Chloride: 100 mmol/L (ref 98–111)
Creatinine, Ser: 0.89 mg/dL (ref 0.61–1.24)
GFR, Estimated: >60 mL/min (ref >60)
Glucose, Bld: 76 mg/dL (ref 70–99)
Potassium: 4.2 mmol/L (ref 3.5–5.1)
Sodium: 136 mmol/L (ref 135–145)
Total Bilirubin: 1 mg/dL (ref 0.3–1.2)
Total Protein: 6.4 g/dL — ABNORMAL LOW (ref 6.5–8.1)

## 2020-11-08 LAB — URINALYSIS, ROUTINE W REFLEX MICROSCOPIC
Bilirubin Urine: NEGATIVE
Glucose, UA: NEGATIVE mg/dL
Hgb urine dipstick: NEGATIVE
Ketones, ur: NEGATIVE mg/dL
Leukocytes,Ua: NEGATIVE
Nitrite: NEGATIVE
Protein, ur: 30 mg/dL — AB
Specific Gravity, Urine: 1.014 (ref 1.005–1.030)
pH: 9 — ABNORMAL HIGH (ref 5.0–8.0)

## 2020-11-08 LAB — CBC WITH DIFFERENTIAL/PLATELET
Abs Immature Granulocytes: 0.04 10*3/uL (ref 0.00–0.07)
Basophils Absolute: 0 10*3/uL (ref 0.0–0.1)
Basophils Relative: 0 %
Eosinophils Absolute: 0.1 10*3/uL (ref 0.0–0.5)
Eosinophils Relative: 1 %
HCT: 28 % — ABNORMAL LOW (ref 39.0–52.0)
Hemoglobin: 8.1 g/dL — ABNORMAL LOW (ref 13.0–17.0)
Immature Granulocytes: 0 %
Lymphocytes Relative: 13 %
Lymphs Abs: 1.2 10*3/uL (ref 0.7–4.0)
MCH: 21.7 pg — ABNORMAL LOW (ref 26.0–34.0)
MCHC: 28.9 g/dL — ABNORMAL LOW (ref 30.0–36.0)
MCV: 75.1 fL — ABNORMAL LOW (ref 80.0–100.0)
Monocytes Absolute: 0.8 10*3/uL (ref 0.1–1.0)
Monocytes Relative: 9 %
Neutro Abs: 7.4 10*3/uL (ref 1.7–7.7)
Neutrophils Relative %: 77 %
Platelets: 219 10*3/uL (ref 150–400)
RBC: 3.73 MIL/uL — ABNORMAL LOW (ref 4.22–5.81)
RDW: 18.1 % — ABNORMAL HIGH (ref 11.5–15.5)
WBC: 9.7 10*3/uL (ref 4.0–10.5)
nRBC: 0 % (ref 0.0–0.2)

## 2020-11-08 LAB — LACTIC ACID, PLASMA: Lactic Acid, Venous: 1.7 mmol/L (ref 0.5–1.9)

## 2020-11-08 LAB — CBG MONITORING, ED: Glucose-Capillary: 82 mg/dL (ref 70–99)

## 2020-11-08 LAB — APTT: aPTT: 33 seconds (ref 24–36)

## 2020-11-08 LAB — RESP PANEL BY RT-PCR (FLU A&B, COVID) ARPGX2
Influenza A by PCR: NEGATIVE
Influenza B by PCR: NEGATIVE
SARS Coronavirus 2 by RT PCR: NEGATIVE

## 2020-11-08 LAB — LEGIONELLA PNEUMOPHILA SEROGP 1 UR AG: L. pneumophila Serogp 1 Ur Ag: NEGATIVE

## 2020-11-08 LAB — SEDIMENTATION RATE: Sed Rate: 34 mm/hr — ABNORMAL HIGH (ref 0–16)

## 2020-11-08 LAB — MAGNESIUM: Magnesium: 1.6 mg/dL — ABNORMAL LOW (ref 1.7–2.4)

## 2020-11-08 LAB — PROTIME-INR
INR: 1.9 — ABNORMAL HIGH (ref 0.8–1.2)
Prothrombin Time: 21.4 seconds — ABNORMAL HIGH (ref 11.4–15.2)

## 2020-11-08 LAB — BRAIN NATRIURETIC PEPTIDE: B Natriuretic Peptide: 1095.9 pg/mL — ABNORMAL HIGH (ref 0.0–100.0)

## 2020-11-08 LAB — C-REACTIVE PROTEIN: CRP: 0.7 mg/dL (ref <1.0)

## 2020-11-08 LAB — PROCALCITONIN: Procalcitonin: <0.1 ng/mL

## 2020-11-08 MED ORDER — ACETAMINOPHEN 650 MG RE SUPP: 650.0000 mg | Freq: Four times a day (QID) | RECTAL | Status: DC | PRN

## 2020-11-08 MED ORDER — FUROSEMIDE 10 MG/ML IJ SOLN
40.0000 mg | Freq: Once | INTRAMUSCULAR | Status: AC
  Administered 2020-11-08: 40 mg via INTRAVENOUS
  Filled 2020-11-08: qty 4

## 2020-11-08 MED ORDER — AZITHROMYCIN 250 MG PO TABS: 250.0000 mg | ORAL_TABLET | ORAL | Status: DC

## 2020-11-08 MED ORDER — SODIUM CHLORIDE 0.9 % IV SOLN
500.0000 mg | Freq: Once | INTRAVENOUS | Status: AC
  Administered 2020-11-08: 500 mg via INTRAVENOUS
  Filled 2020-11-08: qty 500

## 2020-11-08 MED ORDER — LEVOTHYROXINE SODIUM 100 MCG PO TABS
200.0000 ug | ORAL_TABLET | Freq: Every day | ORAL | Status: DC
  Administered 2020-11-08 – 2020-11-16 (×9): 200 ug via ORAL
  Filled 2020-11-08 (×9): qty 2

## 2020-11-08 MED ORDER — NITROGLYCERIN 0.4 MG SL SUBL: 0.4000 mg | SUBLINGUAL_TABLET | SUBLINGUAL | Status: DC | PRN

## 2020-11-08 MED ORDER — MAGNESIUM SULFATE 2 GM/50ML IV SOLN
2.0000 g | Freq: Once | INTRAVENOUS | Status: AC
  Administered 2020-11-08: 2 g via INTRAVENOUS
  Filled 2020-11-08: qty 50

## 2020-11-08 MED ORDER — ASPIRIN 81 MG PO CHEW
81.0000 mg | CHEWABLE_TABLET | Freq: Every day | ORAL | Status: DC
  Administered 2020-11-08 – 2020-11-16 (×9): 81 mg via ORAL
  Filled 2020-11-08 (×9): qty 1

## 2020-11-08 MED ORDER — ATORVASTATIN CALCIUM 40 MG PO TABS
40.0000 mg | ORAL_TABLET | Freq: Every day | ORAL | Status: DC
  Administered 2020-11-08 – 2020-11-15 (×8): 40 mg via ORAL
  Filled 2020-11-08 (×8): qty 1

## 2020-11-08 MED ORDER — PANTOPRAZOLE SODIUM 40 MG PO TBEC
40.0000 mg | DELAYED_RELEASE_TABLET | Freq: Every day | ORAL | Status: DC
  Administered 2020-11-08 – 2020-11-16 (×9): 40 mg via ORAL
  Filled 2020-11-08 (×9): qty 1

## 2020-11-08 MED ORDER — APIXABAN 5 MG PO TABS
5.0000 mg | ORAL_TABLET | Freq: Two times a day (BID) | ORAL | Status: DC
  Administered 2020-11-08 – 2020-11-16 (×17): 5 mg via ORAL
  Filled 2020-11-08 (×17): qty 1

## 2020-11-08 MED ORDER — LOSARTAN POTASSIUM 50 MG PO TABS
50.0000 mg | ORAL_TABLET | Freq: Every day | ORAL | Status: DC
  Administered 2020-11-08 – 2020-11-12 (×5): 50 mg via ORAL
  Filled 2020-11-08 (×5): qty 1

## 2020-11-08 MED ORDER — ALBUTEROL SULFATE (2.5 MG/3ML) 0.083% IN NEBU
5.0000 mg | INHALATION_SOLUTION | Freq: Once | RESPIRATORY_TRACT | Status: AC
  Administered 2020-11-08: 5 mg via RESPIRATORY_TRACT
  Filled 2020-11-08: qty 6

## 2020-11-08 MED ORDER — IPRATROPIUM-ALBUTEROL 0.5-2.5 (3) MG/3ML IN SOLN
3.0000 mL | RESPIRATORY_TRACT | Status: DC
  Administered 2020-11-08 (×2): 3 mL via RESPIRATORY_TRACT
  Filled 2020-11-08 (×2): qty 3

## 2020-11-08 MED ORDER — POLYETHYLENE GLYCOL 3350 17 G PO PACK: 17.0000 g | PACK | Freq: Every day | ORAL | Status: DC | PRN

## 2020-11-08 MED ORDER — SODIUM CHLORIDE 0.9 % IV SOLN
1.0000 g | Freq: Once | INTRAVENOUS | Status: AC
  Administered 2020-11-08: 1 g via INTRAVENOUS
  Filled 2020-11-08: qty 10

## 2020-11-08 MED ORDER — GUAIFENESIN ER 600 MG PO TB12
600.0000 mg | ORAL_TABLET | Freq: Two times a day (BID) | ORAL | Status: DC
  Administered 2020-11-08 – 2020-11-16 (×17): 600 mg via ORAL
  Filled 2020-11-08 (×17): qty 1

## 2020-11-08 MED ORDER — LACTATED RINGERS IV SOLN: INTRAVENOUS | Status: AC

## 2020-11-08 MED ORDER — ACETAMINOPHEN 650 MG RE SUPP
650.0000 mg | Freq: Once | RECTAL | Status: AC
  Administered 2020-11-08: 650 mg via RECTAL
  Filled 2020-11-08: qty 1

## 2020-11-08 MED ORDER — METRONIDAZOLE 500 MG PO TABS
500.0000 mg | ORAL_TABLET | Freq: Three times a day (TID) | ORAL | Status: DC
  Filled 2020-11-08: qty 1

## 2020-11-08 MED ORDER — BENZONATATE 100 MG PO CAPS: 100.0000 mg | ORAL_CAPSULE | Freq: Three times a day (TID) | ORAL | Status: DC | PRN

## 2020-11-08 MED ORDER — IOHEXOL 300 MG/ML  SOLN
75.0000 mL | Freq: Once | INTRAMUSCULAR | Status: AC | PRN
  Administered 2020-11-08: 75 mL via INTRAVENOUS

## 2020-11-08 MED ORDER — IPRATROPIUM-ALBUTEROL 0.5-2.5 (3) MG/3ML IN SOLN: 3.0000 mL | RESPIRATORY_TRACT | Status: DC | PRN

## 2020-11-08 MED ORDER — ACETAMINOPHEN 325 MG PO TABS: 650.0000 mg | ORAL_TABLET | Freq: Four times a day (QID) | ORAL | Status: DC | PRN

## 2020-11-08 MED ORDER — CARVEDILOL 12.5 MG PO TABS
12.5000 mg | ORAL_TABLET | Freq: Two times a day (BID) | ORAL | Status: DC
  Administered 2020-11-08 – 2020-11-16 (×17): 12.5 mg via ORAL
  Filled 2020-11-08 (×17): qty 1

## 2020-11-08 MED ORDER — SODIUM CHLORIDE 0.9 % IV SOLN
3.0000 g | Freq: Four times a day (QID) | INTRAVENOUS | Status: DC
  Administered 2020-11-08 – 2020-11-09 (×4): 3 g via INTRAVENOUS
  Filled 2020-11-08 (×3): qty 8
  Filled 2020-11-08 (×3): qty 3
  Filled 2020-11-08 (×2): qty 8
  Filled 2020-11-08: qty 3
  Filled 2020-11-08 (×2): qty 8

## 2020-11-08 NOTE — Evaluation (Signed)
Clinical/Bedside Swallow Evaluation Patient Details  Name: Mason Patton MRN: [id number] Date of Birth: Aug 03, 1945  Today's Date: 11/08/2020 Time: SLP Start Time (ACUTE ONLY): 1719 SLP Stop Time (ACUTE ONLY): 1730 SLP Time Calculation (min) (ACUTE ONLY): 10.88 min  Past Medical History:  Past Medical History:  Diagnosis Date  . Aortic stenosis    09/30/19 echo ([facility], Cardiologist Dr. Clovis Riley): Mild-moderate AS, MaxVel 308 cm/s, MeanPG 22 mmHg, AVA 1.6 cm2  . Arthritis   . Atrial fibrillation (HCC)   . Carotid artery stenosis    11/03/19 [place] ([facility]): 50-69% RICA stenosis  . Cholecystitis with cholelithiasis   . Eczema   . Emphysema of lung (HCC)   . Gallstones and inflammation of gallbladder without obstruction   . Heart murmur   . History of nuclear stress test 02/23/2010   dipyridamole; normal pattern of perfusion in all regions; normal, low risk study   . Hyperlipidemia   . Hypertension   . 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 Meningoencephalitis   . Obesity   . OSA on CPAP    last sleep study >8 yrs  . Pneumonia    "twice" (05/19/2013)  . Shortness of breath   . Stroke [facility])    "they said I had a minor one when I had menigitis" (05/19/2013)  . Thyroid disease   . Tobacco abuse    quit 11/08/2012  . Type II diabetes mellitus (HCC)   . Umbilical hernia    Past Surgical History:  Past Surgical History:  Procedure Laterality Date  . AORTIC VALVE REPLACEMENT N/A 09/11/2020   Procedure: AORTIC VALVE REPLACEMENT (AVR);  Surgeon: Alleen Borne, MD;  Location: [facility] OR;  Service: Open Heart Surgery;  Laterality: N/A;  . CHOLECYSTECTOMY  09/09/2011   Procedure: LAPAROSCOPIC CHOLECYSTECTOMY WITH INTRAOPERATIVE CHOLANGIOGRAM;  Surgeon: Jetty Duhamel, MD;  Location: [facility] OR;  Service: General;  Laterality: N/A;  . CLIPPING OF ATRIAL APPENDAGE N/A 09/11/2020   Procedure: CLIPPING OF ATRIAL APPENDAGE;  Surgeon: Alleen Borne, MD;   Location: [facility] OR;  Service: Open Heart Surgery;  Laterality: N/A;  . CORONARY ARTERY BYPASS GRAFT N/A 09/11/2020   Procedure: CORONARY ARTERY BYPASS GRAFTING (CABG), ON PUMP, TIMES 2 , USING LEFT INTERNAL MAMMARY ARTERY AND ENDOSCOPICALLY HARVESTED RIGHT GREATER SAPHENOUS VEIN;  Surgeon: Alleen Borne, MD;  Location: [facility] OR;  Service: Open Heart Surgery;  Laterality: N/A;  . HERNIA REPAIR  04/2006   UHR  . IR THORACENTESIS ASP PLEURAL SPACE W/IMG GUIDE  10/25/2020  . JOINT REPLACEMENT    . KNEE ARTHROSCOPY Right    "w2 times" (05/19/2013)  . RIGHT/LEFT HEART CATH AND CORONARY ANGIOGRAPHY N/A 09/07/2020   Procedure: RIGHT/LEFT HEART CATH AND CORONARY ANGIOGRAPHY;  Surgeon: Runell Gess, MD;  Location: [facility] INVASIVE CV LAB;  Service: Cardiovascular;  Laterality: N/A;  . TEE WITHOUT CARDIOVERSION N/A 09/11/2020   Procedure: TRANSESOPHAGEAL ECHOCARDIOGRAM (TEE);  Surgeon: Alleen Borne, MD;  Location: [facility] OR;  Service: Open Heart Surgery;  Laterality: N/A;  . TONSILLECTOMY    . TOTAL KNEE ARTHROPLASTY Left 05/19/2013  . TOTAL KNEE ARTHROPLASTY Left 05/19/2013   Procedure: TOTAL KNEE ARTHROPLASTY;  Surgeon: Nadara Mustard, MD;  Location: [facility] OR;  Service: Orthopedics;  Laterality: Left;  Left Total Knee Arthroplasty  . TOTAL KNEE ARTHROPLASTY Right 11/24/2019  . TOTAL KNEE ARTHROPLASTY Right 11/24/2019   Procedure: RIGHT TOTAL KNEE ARTHROPLASTY;  Surgeon: Nadara Mustard, MD;  Location: [facility] OR;  Service:  Orthopedics;  Laterality: Right;  . TRANSTHORACIC ECHOCARDIOGRAM  03/28/2005   EF=>55%, mod conc LVH; LA mod dilated & borderline RA enlargement; mild TR; mild pulm valve regurg   HPI:  Pt is a 75 year old man with past medical history significant for chronic combined systolic and diastolic heart failure, CAD s/p CABG, aortic stenosis status post AVR, persistent atrial fibrillation (on Eliquis), OSA (on CPAP), T2DM, COPD, prior CVA, iron deficiency anemia, hypothyroidism and recent admission from 11/05/20  to 11/07/20 for acute on chronic HF exacerbation in setting of pneumonia who presented to [facility] on 11/08/20 for shortness of breath and altered mental status. CT head was negative for acute changes. CT soft tissue neck: no source of infection is identified within the neck. Cervical spondylosis. CT chest 5/4: Small left pleural effusion with passive atelectasis, but no  compelling findings of active pneumonia. SLP consulted " to ensure no signs of aspiration."   Assessment / Plan / Recommendation Clinical Impression  Pt was seen for bedside swallow evaluation. Pt denied any consistent symptoms of oropharyngeal dysphagia, but stated that he "got choked the other night" when he was eating while dyspneic. Oral mechanism exam was [facility] and dentition was adequate. He tolerated all solids and liquids without signs or symptoms of oropharyngeal dysphagia, but p.o. intake was limited due to pt's refusal of additional boluses of trials and RR increased to the low 30s during p.o. intake. A regular texture diet with thin liquids is recommended at this time. Considering dyspnea and the limited nature of this evaluation, SLP will follow briefly to ensure diet tolerance. SLP Visit Diagnosis: Dysphagia, unspecified (R13.10)    Aspiration Risk  Mild aspiration risk    Diet Recommendation Regular;Thin liquid   Liquid Administration via: Cup;Straw Medication Administration: Whole meds with liquid Compensations: Slow rate;Small sips/bites (rest breaks if dyspneic) Postural Changes: Seated upright at 90 degrees    Other  Recommendations Oral Care Recommendations: Oral care BID   Follow up Recommendations  (TBD)      Frequency and Duration min 1 x/week  1 week       Prognosis        Swallow Study   General Date of Onset: 11/08/20 HPI: Pt is a 75 year old man with past medical history significant for chronic combined systolic and diastolic heart failure, CAD s/p CABG, aortic stenosis status post AVR,  persistent atrial fibrillation (on Eliquis), OSA (on CPAP), T2DM, COPD, prior CVA, iron deficiency anemia, hypothyroidism and recent admission from 11/05/20 to 11/07/20 for acute on chronic HF exacerbation in setting of pneumonia who presented [facility] on 11/08/20 for shortness of breath and altered mental status. CT head was negative for acute changes. CT soft tissue neck: no source of infection is identified within the neck. Cervical spondylosis. CT chest 5/4: Small left pleural effusion with passive atelectasis, but no  compelling findings of active pneumonia. SLP consulted " to ensure no signs of aspiration." Type of Study: Bedside Swallow Evaluation Previous Swallow Assessment: none Diet Prior to this Study: Regular;Thin liquids Temperature Spikes Noted: No Respiratory Status: Nasal cannula History of Recent Intubation: No Behavior/Cognition: Alert;Cooperative;Pleasant mood Oral Cavity Assessment: Within Functional Limits Oral Care Completed by SLP: No Vision: Functional for self-feeding Self-Feeding Abilities: Able to feed self Patient Positioning: Upright in bed;Postural control adequate for testing Baseline Vocal Quality: Normal Volitional Cough: Weak Volitional Swallow: Able to elicit    Oral/Motor/Sensory Function Overall Oral Motor/Sensory Function: Within functional limits   Ice Chips Ice chips: Within functional limits Presentation: Spoon  Thin Liquid Thin Liquid: Within functional limits Presentation: Straw    Nectar Thick Nectar Thick Liquid: Not tested   Honey Thick Honey Thick Liquid: Not tested   Puree Puree: Within functional limits Presentation: Spoon   Solid     Solid: Within functional limits Presentation: Self Fed     Suda Forbess I. Vear Clock, MS, CCC-SLP Acute Rehabilitation Services Office number [phone] Pager [phone]  Scheryl Marten 11/08/2020,5:33 PM

## 2020-11-08 NOTE — Progress Notes (Signed)
PT Cancellation Note  Patient Details Name: DEVARIS QUIRK MRN: [id number] DOB: 1945/10/22   Cancelled Treatment:    Reason Eval/Treat Not Completed: Patient at procedure or test/unavailable transport arriving to take patient to CT as PT entered room- unable to complete eval at this time. Will continue to follow.    Madelaine Etienne, DPT, PN1   Supplemental Physical Therapist [facility]    Pager [phone] Acute Rehab Office [phone]

## 2020-11-08 NOTE — Progress Notes (Signed)
OT Cancellation Note  Patient Details Name: Mason Patton MRN: [id number] DOB: [birth year]   Cancelled Treatment:    Reason Eval/Treat Not Completed: Patient at procedure or test/ unavailable (ECHO)  Evern Bio 11/08/2020, 2:54 PM  Martie Round, OTR/L Acute Rehabilitation Services Pager: [phone] Office: [phone]

## 2020-11-08 NOTE — H&P (Addendum)
Date: 11/08/2020               Patient Name:  Mason Patton MRN: [id number]  DOB: 12/18/1945 Age / Sex: 75 y.o., male   PCP: Roger Kill, PA-C         Medical Service: Internal Medicine Teaching Service         Attending Physician: Dr. Debe Coder, MD    First Contact: Dr. Evlyn Kanner, MD Pager: [phone]  Second Contact: Dr. Eliezer Bottom, MD Pager: [phone]       After Hours (After 5p/  First Contact Pager: [phone]  weekends / holidays): Second Contact Pager: [phone]   Chief Complaint: Shortness of breath and altered mental status  History of Present Illness: Mason Patton is 75 year old man with past medical history significant for chronic combined systolic and diastolic heart failure, CAD s/p CABG, aortic stenosis status post AVR, persistent atrial fibrillation (on Eliquis), OSA (on CPAP), T2DM, COPD, prior CVA, iron deficiency anemia, hypothyroidism and recent admission from 11/05/20 to 11/07/20 for acute on chronic HF exacerbation in setting of pneumonia who presented to [facility] on [facility]08[facility]20 for shortness of breath and altered mental status.  Patient was recently admitted to IMTS following a tooth extraction with worsening dyspnea, fever and cough. Patient's symptoms were attributed to acute on chronic heart failure exacerbation with superimposed pneumonia. Patient received IV diuresis and started on ceftriaxone and azithromycin. Patient was discharged home with plan to complete two additional days of azithromycin. Following discharge, patient reportedly developed progressively worsening shortness of breath and cough productive of white and brown sputum with one episode of hemoptysis. Additionally, he endorses feeling very tired and experiencing frequent chills. Several hours following discharge, patient was also noted to have increasing confusion and ended up falling out of his bed at home. Following his fall, his wife called EMS who brought him back to the ED. He  otherwise denies chest pain, sore throat, neck swelling, headaches, vision changes, neck pain, abdominal pain, diarrhea, nausea, vomiting, pain with urination or leg swelling.   ED Course: On arrival to the ED, patient was febrile to 102.6, hypertensive to 155/75, tachypneic to 24, saturating at 100% on room air. Initial EKG revealed atrial fibrillation, LAFB, minimal ST depression in lateral leads and prolonged QT interval. Initial labs with negative respiratory panel; CBC with WBC 9.7, Hb 8.1, MCV 75.1; INR 1.9; APTT 33; lactic acid 1.7; CMP and UA unremarkable; blood cultures collected Initial imaging with CXR revealing stable cardiomegaly with mild bibasilar atelectasis; CT Head Wo Contrast showed no acute findings but chronic small vessel ischemia progressed since 2015. Patient received 650mg  acetaminophen suppository, ceftriaxone and albuterol nebulizer. Patient admitted to IMTS for further evaluation and management.  Medications: Acetaminophen 650mg  Q4H PRN Apixaban 5mg  twice daily Aspirin 81mg  daily Atorvastatin 40mg  daily Azithromycin 500mg  for two additional days Carvedilol 12.5mg  twice daily Cholecalciferol 1,000 units daily Ferrous sulfate 325mg  three times weekly Furosemide 60mg  in morning and 40mg  in evening for three days followed by 40mg  daily Glipizide 10mg  twice daily Hydrocodone-acetaminophen 5-325mg  Q6H PRN Levothyroxine daily Losartan 50mg  daily Metformin 1000mg  twice daily Nitroglycerin 0.4mg  Q60min PRN Omeprazole 20mg  daily Potassium chloride twice daily Multivitamin Ventolin 1-2 puffs Q4H PRN Vitamin B12 twice daily  Allergies: Allergies as of 11/08/2020 - Review Complete 11/08/2020  Allergen Reaction Noted  . Codeine Nausea And Vomiting 09/08/2011  . Demerol Nausea And Vomiting 09/08/2011   Past Medical History:  Diagnosis Date  . Aortic stenosis  09/30/19 echo ([facility], Cardiologist Dr. Clovis Riley): Mild-moderate AS, MaxVel 308  cm/s, MeanPG 22 mmHg, AVA 1.6 cm2  . Arthritis   . Atrial fibrillation (HCC)   . Carotid artery stenosis    11/03/19 [place] ([facility]): 50-69% RICA stenosis  . Cholecystitis with cholelithiasis   . Eczema   . Emphysema of lung (HCC)   . Gallstones and inflammation of gallbladder without obstruction   . Heart murmur   . History of nuclear stress test 02/23/2010   dipyridamole; normal pattern of perfusion in all regions; normal, low risk study   . Hyperlipidemia   . Hypertension   . 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 Meningoencephalitis   . Obesity   . OSA on CPAP    last sleep study >8 yrs  . Pneumonia    "twice" (05/19/2013)  . Shortness of breath   . Stroke [facility])    "they said I had a minor one when I had menigitis" (05/19/2013)  . Thyroid disease   . Tobacco abuse    quit 11/08/2012  . Type II diabetes mellitus (HCC)   . Umbilical hernia    Family History:  Family History  Problem Relation Age of Onset  . Cancer Maternal Aunt        colon  . Cancer Maternal Aunt        lung  . Thyroid disease Mother   . Hyperlipidemia Father   . Diabetes Maternal Grandmother   . Asthma Child   . Diabetes type I Child    Social History: Lives at home with wife, independent in ADL's. Retired 2 years ago, was Engineer, drilling working with [employer]. He is a current smoker, 1/2 ppd for last 66yrs. Denies any alcohol or illicit drug use    Review of Systems: A complete ROS was negative except as per HPI.  Physical Exam: Blood pressure (!) 150/81, pulse 72, temperature (!) 102.6 F (39.2 C), temperature source Rectal, resp. rate (!) 27, height 5\' 10"  (1.778 m), weight 108.4 kg, SpO2 98 %. Physical Exam Vitals and nursing note reviewed.  Constitutional:      Appearance: He is obese. He is ill-appearing.     Comments: Elderly man, supine in hospital bed, frequently falling asleep throughout interview and frequently coughing  HENT:     Head:  Normocephalic and atraumatic.     Mouth/Throat:     Mouth: Mucous membranes are moist.     Pharynx: Oropharynx is clear.  Neck:     Vascular: No JVD.  Cardiovascular:     Rate and Rhythm: Normal rate. Rhythm irregular.  Pulmonary:     Effort: No accessory muscle usage or respiratory distress.     Breath sounds: Examination of the left-lower field reveals decreased breath sounds. Decreased breath sounds present.     Comments: Coarse breath sounds centrally Abdominal:     General: Bowel sounds are normal.     Palpations: Abdomen is soft.     Tenderness: There is no abdominal tenderness.  Musculoskeletal:     Cervical back: Normal range of motion and neck supple.     Right lower leg: No edema.     Left lower leg: No edema.  Skin:    General: Skin is warm and dry.  Neurological:     Comments: Oriented to person, place and reason for hospitalization. Reports that the year is 2012. Able to follow basic commands.  Psychiatric:        Mood  and Affect: Mood normal.        Behavior: Behavior normal.   EKG: personally reviewed my interpretation is atrial fibrillation with rate of 84.   CXR: personally reviewed my interpretation is cardiomegaly with bibasilar atelectasis  Assessment & Plan by Problem: Active Problems:   Pneumonia  Dodger Sinning is 75 year old man with past medical history significant for chronic combined systolic and diastolic heart failure, CAD s/p CABG, aortic stenosis status post AVR, persistent atrial fibrillation (on Eliquis), OSA (on CPAP), T2DM, COPD, prior CVA, iron deficiency anemia, hypothyroidism and recent admission from 11/05/20 to 11/07/20 for acute on chronic HF exacerbation in setting of pneumonia who presented to [facility] on 11/08/20 for shortness of breath and altered mental status with continued pneumonia.  #Community acquired pneumonia Patient presenting as a readmission for ongoing community acquired pneumonia with worsening complaints of dyspnea and  productive cough. Patient is saturating well on room air and his CXR is largely unchanged from prior although his examination does reveal significant coarse breath sounds centrally and decreased breath sounds in left lower lung raising concern of a possible aspiration event. Patient would benefit from continuation of antibiotics at this time, although viral infection certainly remains a consideration. Patient's respiratory panel was negative for COVID or influenza. -Hold further ceftriaxone pending procalcitonin -Continue azithromycin for final dose tomorrow -Guaifenesin 12hr tablet 600mg  twice daily -Tessalon perles 100mg  three times daily PRN -Aspiration precautions -Oxygen therapy available if needed -Follow-up blood cultures -Follow-up procalcitonin   #Recent tooth extraction Patient did recently have a tooth extraction prior to the onset of these symptoms. Although pneumonia remains the most obvious consideration, continued fevers following dental operation raises concern of a possible bacterial endocarditis given his valve replacement.  -Follow-up blood cultures -Echocardiogram complete ordered  #Hypoactive delirium, active Patient reportedly having increased confusion since the onset of his infection at last hospitalization. Patient's waxing and waning mentation likely secondary to delirium in the setting of old age, underlying mild cognitive impairment, infection and hospitalization. -Delirium precautions -Treat infection as described above  #Chronic combined systolic and diastolic heart failure Patient appears euvolemic on examination following his prior hospitalization where he received IV diuresis. Patient's weight is stable since discharge. -Follow-up BNP -Will need to restart furosemide -Continue home carvedilol 12.5mg  twice daily -Continue home losartan 50mg  daily -Cardiac monitoring -Strict intake and output -Daily weights  #Persistent atrial fibrillation,  chronic Patient continues to remain in atrial fibrillation this admission with no symptoms of chest pain or palpitations. Patient's heart rate appropriately controlled. -Continue home apixaban 5mg  twice daily  #COPD, chronic Patient saturating near 100% on room air with no appreciable wheezing on examination. -Duonebs Q4H PRN  #T2DM, chronic -CBG monitoring  #GERD, chronic -Continue home pantoprazole 40mg  daily  #Code status: Full code #Diet: Heart healthy/carb modified #IVF: None #Bowel regimen: Miralax 17g daily PRN  Dispo: Admit patient to Inpatient with expected length of stay at least two midnights  Signed: , MD 11/08/2020, 6:02 AM  Pager: [phone] After 5pm on weekdays and 1pm on weekends: On Call pager: [phone]

## 2020-11-08 NOTE — ED Triage Notes (Signed)
Pt arrives to ED BIB [facility] due to [facility] and worseining confusion. Per EMS pt was admitted for pneumonia and d/c from hospital today due to same.

## 2020-11-08 NOTE — Progress Notes (Signed)
Subjective:  Mason Patton  Patient evaluated at bedside this AM. States he is having some trouble breathing, but does not remember going home yesterday. Mentions he is having a dry cough as well. Otherwise denies chest pain, headache, blurry vision, abdominal pain, leg pain.  Objective:  Vital signs in last 24 hours: Vitals:   11/08/20 0500 11/08/20 0504 11/08/20 0530 11/08/20 0625  BP: (!) 144/77  (!) 150/81   Pulse: 77 76 72   Resp: (!) 27 18 (!) 27   Temp:    99.4 F (37.4 C)  TempSrc:    Rectal  SpO2: 92% 95% 98%   Weight:      Height:       Physical Exam: General: Laying in bed, no acute distress CV: Regular rate, irregular rhythm. 2/6 systolic murmur present. Distal pulses 2+ bilaterally. Pulm: Normal work of breathing on supplemental oxygen. Coarse breath sounds. Abdomen: Soft, non-tender, non-distended. Normoactive bowel sounds MSK: Bilateral lower legs without erythema, swelling, or tenderness. No nuchal rigidity.   Skin: Warm, dry. No open wounds on heels, legs, or arms. Neuro: Lethargic, awake, oriented x4.   CBC Latest Ref Rng & Units 11/08/2020 11/07/2020 11/06/2020  WBC 4.0 - 10.5 K/uL 9.7 7.2 7.1  Hemoglobin 13.0 - 17.0 g/dL 8.1(L) 7.7(L) 7.1(L)  Hematocrit 39.0 - 52.0 % 28.0(L) 25.7(L) 24.0(L)  Platelets 150 - 400 K/uL 219 186 184   BMP Latest Ref Rng & Units 11/08/2020 11/07/2020 11/06/2020  Glucose 70 - 99 mg/dL 76 173(V) 670(L)  BUN 8 - 23 mg/dL 11 12 13   Creatinine 0.61 - 1.24 mg/dL 4.10 3.01  BUN/Creat Ratio 10 - 24 - - -  Sodium 135 - 145 mmol/L 136 135 135  Potassium 3.5 - 5.1 mmol/L 4.2 3.9 3.4(L)  Chloride 98 - 111 mmol/L 100 99 97(L)  CO2 22 - 32 mmol/L 26 27 28   Calcium 8.9 - 10.3 mg/dL 9.0 3.14) )   Assessment/Plan: Juluis Patton is 75yo [place] with chronic combined systolic and diastolic heart failure, CAD s/p CABG, aortic stenosis s/p AVR, persistent atrial fibrillation on Eliquis, OSA on CPAP, T2DM, COPD, prior CVA admitted 5/4 for continued  dyspnea, febrile illness, and altered mental status despite IV antibiotics.  Active Problems:   Pneumonia  #Dyspnea w/ fever  #Altered mental status/delirium Patient returning to hospital overnight after discharge yesterday. At home reportedly continued to be dyspneic and was very confused, subsequently had a fall off the bed. On arrival, patient febrile 102.62F, tachypneic, otherwise hemodynamically stable on room air. Prior to last admission, he was taking daily amoxicillin for tooth extraction. He then received three days azithromycin and one dose of ceftriaxone. Blood cultures from 4/30 and 5/1 continue to have no growth. Procal negative x2. Lactic originally elevated at 2.5 at beginning of last admission, but is normal upon this admission. CXR on arrival overnight difficult to assess given rotation. On exam this morning he is lethargic but oriented x4, having dry cough. At this juncture, no clear source of infection. UA negative, BCx negative, no open wounds, no GI symptoms, no focal neurological symptoms or nuchal rigidity. Also no new cardiac symptoms, but will obtain TTE given recent dental procedure and history of AVR. CT soft tissue neck and chest revealed no abscesses or active pneumonia. No signs of PE on CT, although this does not have high sensitivity for PE and he is not tachycardic. He does have calcified plaque at R ICA with significant stenosis with hx CVA. Will also order MRI  brain for further explanation of altered mental status, possibly stroke vs signs of encephalitis. Can also consider LP pending those results. 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 However, he currently has no symptoms concerning for meningitis. Also possible patient has delirium in setting of mild cognitive dysfunction. Difficult to say whether mental status has been Today broadened antibiotic coverage to include anaerobic coverage and will have SLP eval to ensure no  signs of aspiration.  - C/w azithromycin (day 4) - Start Unasyn - F/u TTE, MRI - Duonebs q4h PRN - Guaifenesin 600mg  BID - Tessalon 100mg  TID PRN - Incentive spirometry - PT/OT/SLP - F/u BCx  #Chronic combined systolic and diastolic heart failure Patient euvolemic on exam, small L pleural effusion seen on CT but otherwise no signs of acute heart failure. Continues to be hemodynamically stable. Will give one dose IV lasix this evening, likely can resume home lasix tomorrow. Otherwise will continue with GDMT.  - IV lasix 40mg  x1 - C/w home Coreg, losartan - Strict I/O - Daily weights - F/u BMP, Mg  #Chronic persistent atrial fibrillation Patient in atrial fibrillation, rate controlled with home carvedilol. Will continue with home Eliquis. - Eliquis 5mg  BID  DIET: HH/CM IVF: n/a DVT PPX: Home Eliquis BOWEL: Miralax PRN CODE: FULL FAM COM: Wife present at bedside on admission with overnight team  Prior to Admission Living Arrangement: Home Anticipated Discharge Location: Pending PT/OT Barriers to Discharge: medical management Dispo: Anticipated discharge in approximately 2-3 day(s).   , MD 11/08/2020, 7:43 AM Pager: [phone] After 5pm on weekdays and 1pm on weekends: On Call pager [phone]

## 2020-11-08 NOTE — ED Provider Notes (Signed)
[facility] EMERGENCY DEPARTMENT Provider Note   CSN: [id number] Arrival date & time: 11/08/20  0158     History Chief Complaint  Patient presents with  . Shortness of Breath   Level 5 caveat due to altered mental status Mason Patton is a 75 y.o. male.  The history is provided by the patient and the EMS personnel.  Shortness of Breath Severity:  Moderate Onset quality:  Gradual Timing:  Constant Progression:  Worsening Chronicity:  Recurrent Relieved by:  Nothing Worsened by:  Nothing Patient with extensive history including aortic stenosis, atrial fibrillation, hypertension, CHF presents with shortness of breath and confusion.  Patient was just discharged in the hospital, and after getting home patient was found to be altered and short of breath. Patient is unable to provide further details.    Past Medical History:  Diagnosis Date  . Aortic stenosis    09/30/19 echo ([facility], Cardiologist Dr. Geraldo Pitter): Mild-moderate AS, MaxVel 308 cm/s, MeanPG 22 mmHg, AVA 1.6 cm2  . Arthritis   . Atrial fibrillation ([place])   . Carotid artery stenosis    11/03/19 [place] ([facility]): 50-69% RICA stenosis  . Cholecystitis with cholelithiasis   . Eczema   . Emphysema of lung ([place])   . Gallstones and inflammation of gallbladder without obstruction   . Heart murmur   . History of nuclear stress test 02/23/2010   dipyridamole; normal pattern of perfusion in all regions; normal, low risk study   . Hyperlipidemia   . Hypertension   . 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 Meningoencephalitis   . Obesity   . OSA on CPAP    last sleep study >8 yrs  . Pneumonia    "twice" (05/19/2013)  . Shortness of breath   . Stroke [facility])    "they said I had a minor one when I had menigitis" (05/19/2013)  . Thyroid disease   . Tobacco abuse    quit 11/08/2012  . Type II diabetes mellitus ([facility])   . Umbilical hernia     Patient Active  Problem List   Diagnosis Date Noted  . Acute CHF ([place]) 11/06/2020  . Dyspnea 11/05/2020  . Acute exacerbation of CHF (congestive heart failure) ([place]) 11/05/2020  . Coronary artery disease 09/11/2020  . Aortic stenosis, severe 09/07/2020  . Unstable angina ([place]) 09/06/2020  . Arthritis of right knee 11/24/2019  . Unilateral primary osteoarthritis, right knee   . Uncontrolled type 2 diabetes mellitus with peripheral neuropathy ([place]) 03/27/2015  . 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 CVA (cerebral infarction) 11/16/2013  . Dizziness 11/15/2013  . Left sided lacunar infarction ([place]) 11/15/2013  . Lacunar infarction ([place]) 06/01/2013  . Acute, but ill-defined, cerebrovascular disease 06/01/2013  . 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 Acute CVA (cerebrovascular accident) ([place]) 03/29/2013  . Numbness and tingling 03/27/2013  . Hyperglycemia 03/26/2013  . Meningitis 03/25/2013  . Encephalopathy acute 03/23/2013  . Fever 03/23/2013  . OSA on CPAP 03/23/2013  . Obesity 03/23/2013  . Murmur 03/16/2013  . Preoperative cardiovascular examination 03/16/2013  . Dyslipidemia 03/16/2013  . Hypertension   . Cholecystitis with cholelithiasis   . Apnea, sleep 01/23/2010    Past Surgical History:  Procedure Laterality Date  . AORTIC VALVE REPLACEMENT N/A 09/11/2020   Procedure: AORTIC VALVE REPLACEMENT (AVR);  Surgeon: Gaye Pollack, MD;  Location: [place];  Service: Open Heart Surgery;  Laterality: N/A;  . CHOLECYSTECTOMY  09/09/2011   Procedure: LAPAROSCOPIC CHOLECYSTECTOMY WITH INTRAOPERATIVE CHOLANGIOGRAM;  Surgeon:  Gwenyth Ober III, MD;  Location: [place];  Service: General;  Laterality: N/A;  . CLIPPING OF ATRIAL APPENDAGE N/A 09/11/2020   Procedure: CLIPPING OF ATRIAL APPENDAGE;  Surgeon: Gaye Pollack, MD;  Location: [place];  Service: Open Heart Surgery;  Laterality: N/A;  . CORONARY ARTERY BYPASS GRAFT N/A 09/11/2020   Procedure: CORONARY ARTERY BYPASS GRAFTING (CABG), ON PUMP, TIMES 2 ,  USING LEFT INTERNAL MAMMARY ARTERY AND ENDOSCOPICALLY HARVESTED RIGHT GREATER SAPHENOUS VEIN;  Surgeon: Gaye Pollack, MD;  Location: [place];  Service: Open Heart Surgery;  Laterality: N/A;  . HERNIA REPAIR  04/2006   UHR  . IR THORACENTESIS ASP PLEURAL SPACE W/IMG GUIDE  10/25/2020  . JOINT REPLACEMENT    . KNEE ARTHROSCOPY Right    "w2 times" (05/19/2013)  . RIGHT/LEFT HEART CATH AND CORONARY ANGIOGRAPHY N/A 09/07/2020   Procedure: RIGHT/LEFT HEART CATH AND CORONARY ANGIOGRAPHY;  Surgeon: Lorretta Harp, MD;  Location: [facility];  Service: Cardiovascular;  Laterality: N/A;  . TEE WITHOUT CARDIOVERSION N/A 09/11/2020   Procedure: TRANSESOPHAGEAL ECHOCARDIOGRAM (TEE);  Surgeon: Gaye Pollack, MD;  Location: [facility];  Service: Open Heart Surgery;  Laterality: N/A;  . TONSILLECTOMY    . TOTAL KNEE ARTHROPLASTY Left 05/19/2013  . TOTAL KNEE ARTHROPLASTY Left 05/19/2013   Procedure: TOTAL KNEE ARTHROPLASTY;  Surgeon: Newt Minion, MD;  Location: [place];  Service: Orthopedics;  Laterality: Left;  Left Total Knee Arthroplasty  . TOTAL KNEE ARTHROPLASTY Right 11/24/2019  . TOTAL KNEE ARTHROPLASTY Right 11/24/2019   Procedure: RIGHT TOTAL KNEE ARTHROPLASTY;  Surgeon: Newt Minion, MD;  Location: [place];  Service: Orthopedics;  Laterality: Right;  . TRANSTHORACIC ECHOCARDIOGRAM  03/28/2005   EF=>55%, mod conc LVH; LA mod dilated & borderline RA enlargement; mild TR; mild pulm valve regurg       Family History  Problem Relation Age of Onset  . Cancer Maternal Aunt        colon  . Cancer Maternal Aunt        lung  . Thyroid disease Mother   . Hyperlipidemia Father   . Diabetes Maternal Grandmother   . Asthma Child   . Diabetes type I Child     Social History   Tobacco Use  . Smoking status: Current Every Day Smoker    Packs/day: 0.50    Years: 50.00    Pack years: 25.00    Types: Cigarettes  . Smokeless tobacco: Never Used  Vaping Use  . Vaping Use: Never used  Substance  Use Topics  . Alcohol use: No  . Drug use: No    Home Medications Prior to Admission medications   Medication Sig Start Date End Date Taking? Authorizing Provider  acetaminophen (TYLENOL) 325 MG tablet Take 2 tablets (650 mg total) by mouth every 4 (four) hours as needed for headache or mild pain. Patient taking differently: Take 325-650 mg by mouth every 4 (four) hours as needed for headache or mild pain. 09/15/20   Nani Skillern, PA-C  apixaban (ELIQUIS) 5 MG TABS tablet Take 5 mg by mouth 2 (two) times daily.    [provider]  aspirin EC 81 MG EC tablet Take 1 tablet (81 mg total) by mouth daily. Swallow whole. 09/18/20   Elgie Collard, PA-C  atorvastatin (LIPITOR) 40 MG tablet Take 40 mg by mouth at bedtime. Reported on 01/11/2016 11/22/15   [provider]  azithromycin (ZITHROMAX) 500 MG tablet Take 1 tablet (500  mg total) by mouth daily for 2 days. Take 1 tablet daily for 3 days. 11/07/20 11/09/20  Sanjuan Dame, MD  azithromycin (ZITHROMAX) 500 MG tablet Take 1 tablet (500 mg total) by mouth daily for 2 days. Take 1 tablet daily for 3 days. 11/07/20 11/09/20  Sanjuan Dame, MD  azithromycin (ZITHROMAX) 500 MG tablet Take 1 tablet (500 mg total) by mouth daily for 2 doses. Take 1 tablet daily for 3 days. 11/07/20 11/09/20  Sanjuan Dame, MD  benzonatate (TESSALON PERLES) 100 MG capsule Take 1 capsule (100 mg total) by mouth 3 (three) times daily as needed for cough. 11/07/20 12/07/20  Sanjuan Dame, MD  benzonatate (TESSALON PERLES) 100 MG capsule Take 1 capsule (100 mg total) by mouth 3 (three) times daily as needed for cough. 11/07/20 11/07/21  Sanjuan Dame, MD  benzonatate (TESSALON PERLES) 100 MG capsule Take 1 capsule (100 mg total) by mouth 3 (three) times daily as needed for up to 30 doses for cough. 11/07/20   Sanjuan Dame, MD  Blood Glucose Monitoring Suppl (ONETOUCH VERIO FLEX SYSTEM) W/DEVICE KIT 1 each by Does not apply route daily. Dx: E11.9 03/30/15    Philemon Kingdom, MD  carvedilol (COREG) 12.5 MG tablet Take 1 tablet (12.5 mg total) by mouth 2 (two) times daily with a meal. 09/17/20   Elgie Collard, PA-C  cholecalciferol (VITAMIN D3) 25 MCG (1000 UNIT) tablet Take 1,000 Units by mouth daily.    [provider]  Cyanocobalamin (VITAMIN B12) 500 MCG TABS Take 500 mcg by mouth 2 (two) times daily.    [provider]  ferrous sulfate 325 (65 FE) MG tablet Take 325 mg by mouth 3 (three) times a week. 03/21/20   [provider]  furosemide (LASIX) 20 MG tablet Take 3 tablets (60 mg total) by mouth in the AM, then take 2 tabs (40 mg total) by mouth in the PM for 3 days only, then go back to taking 2 tabs (40 mg total) by mouth daily thereafter. Patient taking differently: Take 40 mg by mouth in the morning. 10/02/20   Freada Bergeron, MD  glipiZIDE (GLUCOTROL) 10 MG tablet Take 10 mg by mouth 2 (two) times daily before a meal.    [provider]  glucose blood (ONETOUCH VERIO) test strip Use to test blood sugar 2-3 times daily as instructed. Dx: E11.9 10/12/15   Philemon Kingdom, MD  HYDROcodone-acetaminophen (NORCO/VICODIN) 5-325 MG tablet Take 1 tablet by mouth every 6 (six) hours as needed (for pain).    [provider]  levothyroxine (SYNTHROID) 200 MCG tablet Take 1 tablet (200 mcg total) by mouth daily. 11/07/20 12/07/20  Sanjuan Dame, MD  losartan (COZAAR) 50 MG tablet Take 1 tablet (50 mg total) by mouth daily. 10/02/20   Freada Bergeron, MD  metFORMIN (GLUCOPHAGE) 500 MG tablet Take 1,000 mg by mouth 2 (two) times daily with a meal.    [provider]  Multiple Vitamins-Minerals (CENTRUM SILVER 50+MEN) TABS Take 1 tablet by mouth daily with breakfast.    [provider]  Multiple Vitamins-Minerals (MULTIVITAMIN WITH MINERALS) tablet Take 1 tablet by mouth daily.    [provider]  Multiple Vitamins-Minerals (PRESERVISION AREDS 2+MULTI VIT PO) Take 1 capsule by  mouth in the morning and at bedtime.    [provider]  nitroGLYCERIN (NITROSTAT) 0.4 MG SL tablet Place 0.4 mg under the tongue every 5 (five) minutes as needed. 08/17/20   [provider]  omeprazole (PRILOSEC) 20 MG  capsule Take 20 mg by mouth daily before breakfast. 01/18/20   [provider]  ONETOUCH DELICA LANCETS FINE MISC Use to test blood sugar 2-3 times daily as instructed. Dx: E11.9 10/12/15   Gherghe, Cristina, MD  potassium chloride SA (KLOR-CON) 20 MEQ tablet Take 1 tablet (20 mEq total) by mouth 2 (two) times daily. Patient not taking: Reported on 11/05/2020 10/02/20   Pemberton, Heather E, MD  VENTOLIN HFA 108 (90 Base) MCG/ACT inhaler Inhale 1-2 puffs into the lungs every 4 (four) hours as needed for wheezing or shortness of breath.  07/12/15   [provider]    Allergies    Codeine and Demerol  Review of Systems   Review of Systems  Unable to perform ROS: Mental status change  Respiratory: Positive for shortness of breath.     Physical Exam Updated Vital Signs BP (!) 156/76   Pulse 83   Temp (!) 102.6 F (39.2 C) (Rectal)   Resp (!) 39   Ht 1.778 m (5' 10")   Wt 108.4 kg   SpO2 99%   BMI 34.29 kg/m   Physical Exam CONSTITUTIONAL: Elderly, ill-appearing HEAD: Normocephalic/atraumatic EYES: EOMI/PERRL ENMT: Mucous membranes dry, no stridor, no drooling NECK: supple no meningeal signs, bruising noted anterior neck but there is no fluctuance or induration SPINE/BACK:entire spine nontender CV: S1/S2 noted, no loud murmurs noted LUNGS: Crackles bilaterally, tachypneic ABDOMEN: soft, nontender, no rebound or guarding, bowel sounds noted throughout abdomen GU:no cva tenderness NEURO: Pt is awake/alert, moves all extremitiesx4.  No facial droop.  Patient appears confused, when answering questions he drifts off and does not finish his symptoms EXTREMITIES: pulses normal/equal, full ROM, All other extremities/joints palpated/ranged and  nontender SKIN: warm, color normal PSYCH: Unable to assess  ED Results / Procedures / Treatments   Labs (all labs ordered are listed, but only abnormal results are displayed) Labs Reviewed  COMPREHENSIVE METABOLIC PANEL - Abnormal; Notable for the following components:      Result Value   Total Protein 6.4 (*)    Albumin 3.2 (*)    All other components within normal limits  CBC WITH DIFFERENTIAL/PLATELET - Abnormal; Notable for the following components:   RBC 3.73 (*)    Hemoglobin 8.1 (*)    HCT 28.0 (*)    MCV 75.1 (*)    MCH 21.7 (*)    MCHC 28.9 (*)    RDW 18.1 (*)    All other components within normal limits  PROTIME-INR - Abnormal; Notable for the following components:   Prothrombin Time 21.4 (*)    INR 1.9 (*)    All other components within normal limits  URINALYSIS, ROUTINE W REFLEX MICROSCOPIC - Abnormal; Notable for the following components:   pH 9.0 (*)    Protein, ur 30 (*)    Bacteria, UA RARE (*)    All other components within normal limits  RESP PANEL BY RT-PCR (FLU A&B, COVID) ARPGX2  URINE CULTURE  CULTURE, BLOOD (ROUTINE X 2)  CULTURE, BLOOD (ROUTINE X 2)  LACTIC ACID, PLASMA  APTT  LACTIC ACID, PLASMA    EKG EKG Interpretation  Date/Time:  Wednesday Nov 08 2020 02:09:25 EDT Ventricular Rate:  84 PR Interval:    QRS Duration: 107 QT Interval:  429 QTC Calculation: 508 R Axis:   -74 Text Interpretation: Atrial fibrillation Left anterior fascicular block Anteroseptal infarct, old Minimal ST depression, lateral leads Prolonged QT interval Confirmed by , Rett ([id number]) on 11/08/2020 2:19:30 AM   Radiology CT   Head Wo Contrast  Result Date: 11/08/2020 CLINICAL DATA:  Delirium EXAM: CT HEAD WITHOUT CONTRAST TECHNIQUE: Contiguous axial images were obtained from the base of the skull through the vertex without intravenous contrast. COMPARISON:  Brain MRI 11/15/2013 FINDINGS: Brain: No evidence of acute cortical infarction, hemorrhage,  hydrocephalus, extra-axial collection or mass lesion/mass effect. Chronic small vessel ischemic gliosis in the cerebral white matter. Based on prior, chronic perforator infarct at the right basal ganglia. Chronic lacunar infarct at the right thalamus. Small remote left cerebellar infarction. Vascular: No hyperdense vessel or unexpected calcification. Skull: Normal. Negative for fracture or focal lesion. Sinuses/Orbits: No acute finding. IMPRESSION: 1. No acute finding. 2. Chronic small vessel ischemia that is progressed from 2015. Electronically Signed   By: Jonathon  Watts M.D.   On: 11/08/2020 04:32   DG Chest Port 1 View  Result Date: 11/08/2020 CLINICAL DATA:  Worsening confusion. EXAM: PORTABLE CHEST 1 VIEW COMPARISON:  Nov 05, 2020 FINDINGS: Multiple sternal wires and vascular clips are seen. There is no evidence of a pleural effusion or pneumothorax. Mild atelectasis is noted within the bilateral lung bases. The cardiac silhouette is mildly enlarged and unchanged in size. An artificial aortic valve is seen. A radiopaque atrial appendage clip is also noted. The visualized skeletal structures are unremarkable. IMPRESSION: 1. Stable cardiomegaly and postoperative changes with mild bibasilar atelectasis. Electronically Signed   By: Thaddeus  Houston M.D.   On: 11/08/2020 02:59    Procedures .Critical Care Performed by: , [place], MD Authorized by: , Shawn, MD   Critical care provider statement:    Critical care time (minutes):  50   Critical care start time:  11/08/2020 3:10 AM   Critical care end time:  11/08/2020 4:00 AM   Critical care time was exclusive of:  Separately billable procedures and treating other patients   Critical care was necessary to treat or prevent imminent or life-threatening deterioration of the following conditions:  Respiratory failure, sepsis and CNS failure or compromise   Critical care was time spent personally by me on the following activities:  Development  of treatment plan with patient or surrogate, discussions with consultants, evaluation of patient's response to treatment, examination of patient, re-evaluation of patient's condition, pulse oximetry, ordering and review of radiographic studies, ordering and review of laboratory studies, review of old charts, ordering and performing treatments and interventions and obtaining history from patient or surrogate   I assumed direction of critical care for this patient from another provider in my specialty: no     Care discussed with: admitting provider       Medications Ordered in ED Medications  cefTRIAXone (ROCEPHIN) 1 g in sodium chloride 0.9 % 100 mL IVPB (1 g Intravenous New Bag/Given 11/08/20 0457)  azithromycin (ZITHROMAX) 500 mg in sodium chloride 0.9 % 250 mL IVPB (has no administration in time range)  albuterol (PROVENTIL) (2.5 MG/3ML) 0.083% nebulizer solution 5 mg (has no administration in time range)  acetaminophen (TYLENOL) suppository 650 mg (650 mg Rectal Given 11/08/20 0324)    ED Course  I have reviewed the triage vital signs and the nursing notes.  Pertinent labs & imaging results that were available during my care of the patient were reviewed by me and considered in my medical decision making (see chart for details).    MDM Rules/Calculators/A&P                          3:02 AM Patient presents from home for   altered mental status and shortness of breath.  Patient was found to be febrile.  He was just in the hospital with CHF with superimposed pneumonia.  He was discharged home on Zithromax. Imaging and labs are pending at this time 3:45 AM Wife is at bedside.  She reports he got home he was confused.  He rolled out of the bed but does not think he had any traumatic injuries.  He did have recent dental extraction and that explains the bruising on his neck.  This is not an acute issue.  He was pretreated with amoxicillin prior to the dental procedure.  Patient's had previous  history of aortic valve repair Although the confusion is likely due to acute infection, will obtain CT head to rule out intracranial hemorrhage as patient is on anticoagulation Once this is ruled out, and urinalysis has been reviewed, will restart antibiotics 5:03 AM Overall patient appears to be improving.  He is more responsive to questioning.  He has no meningeal signs  however he still appears short of breath with a wet cough.  Although his x-ray is essentially unchanged, he still appears to have a respiratory illness.  COVID-19 testing is negative. We will restart antibiotics for community-acquired pneumonia. Blood cultures are pending. Lactic acid is negative.  CT head is negative.  No signs of UTI Discussed with internal medicine resident Dr. Sharon Seller for admission Final Clinical Impression(s) / ED Diagnoses Final diagnoses:  Acute respiratory failure with hypoxia [facility])    Rx / DC Orders ED Discharge Orders    None       Ripley Fraise, MD 11/08/20 0505

## 2020-11-08 NOTE — Progress Notes (Addendum)
Pharmacy Antibiotic Note  Mason Patton is a 75 y.o. male admitted on 11/08/2020 with aspiration PNA.  Pharmacy has been consulted for unasyn dosing.  Has been on antibiotics of azithromycin/ceftriaxone since 5/1. WBC 9.7, LA 1.7, afebrile. Scr 0.89 (CrCl 89 mL/min).   Plan: Unasyn 3g IV every 6 hours Plan for 1 more dose of azithromycin tomorrow to complete 5 total days Monitor renal fx, cx results, clinical pic, and LOT  Height: 5\' 10"  (177.8 cm) Weight: 108.4 kg (239 lb) IBW/kg (Calculated) : 73  Temp (24hrs), Avg:100.2 F (37.9 C), Min:98.6 F (37 C), Max:102.6 F (39.2 C)  Recent Labs  Lab 11/04/20 1850 11/04/20 2030 11/05/20 0728 11/05/20 0729 11/05/20 0757 11/05/20 0931 11/06/20 0737 11/07/20 0358 11/08/20 0239  WBC 7.8  --  7.9  --   --   --  7.1 7.2 9.7  CREATININE 0.72  --  0.90  --  0.80  --  0.82 0.83 0.89  LATICACIDVEN 1.0 1.1  --  2.5*  --  1.7  --   --  1.7    Estimated Creatinine Clearance: 89.8 mL/min (by C-G formula based on SCr of 0.89 mg/dL).    Allergies  Allergen Reactions  . Codeine Nausea And Vomiting  . Demerol Nausea And Vomiting    Antimicrobials this admission: Azithromycin/Ceftraixone 5/1 >> 5/4 (missed ceftriaxone dose on 5/3) Unasyn 5/4 >>   Dose adjustments this admission: N/A  Microbiology results: 5/4 BCx: sent 5/4 UCx: sent  5/4 COVID PCR: neg   Thank you for allowing pharmacy to be a part of this patient's care.  7/4, PharmD, BCCCP Clinical Pharmacist  Phone: [phone] 11/08/2020 9:43 AM  Please check AMION for all [facility] Pharmacy phone numbers After 10:00 PM, call Main Pharmacy [phone]

## 2020-11-08 NOTE — Progress Notes (Signed)
  Echocardiogram 2D Echocardiogram has been performed.  Mason Patton 11/08/2020, 3:07 PM

## 2020-11-08 NOTE — Plan of Care (Signed)
Care plan acknowledged

## 2020-11-09 ENCOUNTER — Inpatient Hospital Stay (HOSPITAL_COMMUNITY): Payer: No Typology Code available for payment source

## 2020-11-09 ENCOUNTER — Encounter (HOSPITAL_COMMUNITY): Payer: Self-pay | Admitting: Internal Medicine

## 2020-11-09 DIAGNOSIS — I5042 Chronic combined systolic (congestive) and diastolic (congestive) heart failure: Secondary | ICD-10-CM

## 2020-11-09 DIAGNOSIS — A419 Sepsis, unspecified organism: Secondary | ICD-10-CM | POA: Diagnosis not present

## 2020-11-09 DIAGNOSIS — R0600 Dyspnea, unspecified: Secondary | ICD-10-CM | POA: Diagnosis not present

## 2020-11-09 DIAGNOSIS — R7881 Bacteremia: Secondary | ICD-10-CM | POA: Diagnosis not present

## 2020-11-09 DIAGNOSIS — B957 Other staphylococcus as the cause of diseases classified elsewhere: Secondary | ICD-10-CM | POA: Diagnosis not present

## 2020-11-09 DIAGNOSIS — I4819 Other persistent atrial fibrillation: Secondary | ICD-10-CM

## 2020-11-09 LAB — GLUCOSE, CAPILLARY
Glucose-Capillary: 88 mg/dL (ref 70–99)
Glucose-Capillary: 142 mg/dL — ABNORMAL HIGH (ref 70–99)
Glucose-Capillary: 152 mg/dL — ABNORMAL HIGH (ref 70–99)
Glucose-Capillary: 129 mg/dL — ABNORMAL HIGH (ref 70–99)

## 2020-11-09 LAB — URINE CULTURE: Culture: NO GROWTH

## 2020-11-09 LAB — BASIC METABOLIC PANEL
Anion gap: 10 (ref 5–15)
BUN: 9 mg/dL (ref 8–23)
CO2: 23 mmol/L (ref 22–32)
Calcium: 8.4 mg/dL — ABNORMAL LOW (ref 8.9–10.3)
Chloride: 103 mmol/L (ref 98–111)
Creatinine, Ser: 0.76 mg/dL (ref 0.61–1.24)
GFR, Estimated: >60 mL/min (ref >60)
Glucose, Bld: 88 mg/dL (ref 70–99)
Potassium: 4.4 mmol/L (ref 3.5–5.1)
Sodium: 136 mmol/L (ref 135–145)

## 2020-11-09 LAB — CBC
HCT: 25.7 % — ABNORMAL LOW (ref 39.0–52.0)
Hemoglobin: 7.5 g/dL — ABNORMAL LOW (ref 13.0–17.0)
MCH: 21.8 pg — ABNORMAL LOW (ref 26.0–34.0)
MCHC: 29.2 g/dL — ABNORMAL LOW (ref 30.0–36.0)
MCV: 74.7 fL — ABNORMAL LOW (ref 80.0–100.0)
Platelets: 169 10*3/uL (ref 150–400)
RBC: 3.44 MIL/uL — ABNORMAL LOW (ref 4.22–5.81)
RDW: 18.5 % — ABNORMAL HIGH (ref 11.5–15.5)
WBC: 8.1 10*3/uL (ref 4.0–10.5)
nRBC: 0 % (ref 0.0–0.2)

## 2020-11-09 LAB — PROCALCITONIN: Procalcitonin: <0.1 ng/mL

## 2020-11-09 LAB — MAGNESIUM: Magnesium: 2.2 mg/dL (ref 1.7–2.4)

## 2020-11-09 MED ORDER — IPRATROPIUM-ALBUTEROL 0.5-2.5 (3) MG/3ML IN SOLN
3.0000 mL | RESPIRATORY_TRACT | Status: DC | PRN
  Administered 2020-11-09: 3 mL via RESPIRATORY_TRACT
  Filled 2020-11-09: qty 3

## 2020-11-09 MED ORDER — INSULIN ASPART 100 UNIT/ML IJ SOLN
0.0000 [IU] | Freq: Three times a day (TID) | INTRAMUSCULAR | Status: DC
  Administered 2020-11-09 – 2020-11-10 (×2): 3 [IU] via SUBCUTANEOUS
  Administered 2020-11-11 – 2020-11-12 (×3): 2 [IU] via SUBCUTANEOUS
  Administered 2020-11-12: 3 [IU] via SUBCUTANEOUS
  Administered 2020-11-13: 2 [IU] via SUBCUTANEOUS
  Administered 2020-11-13 – 2020-11-15 (×5): 3 [IU] via SUBCUTANEOUS

## 2020-11-09 MED ORDER — FUROSEMIDE 40 MG PO TABS
40.0000 mg | ORAL_TABLET | Freq: Every day | ORAL | Status: DC
  Administered 2020-11-09 – 2020-11-16 (×8): 40 mg via ORAL
  Filled 2020-11-09 (×8): qty 1

## 2020-11-09 MED ORDER — SENNOSIDES-DOCUSATE SODIUM 8.6-50 MG PO TABS
1.0000 | ORAL_TABLET | Freq: Every day | ORAL | Status: DC
  Administered 2020-11-09 – 2020-11-11 (×3): 1 via ORAL
  Filled 2020-11-09 (×3): qty 1

## 2020-11-09 MED ORDER — SODIUM CHLORIDE 0.9 % IV SOLN
510.0000 mg | Freq: Once | INTRAVENOUS | Status: AC
  Administered 2020-11-09: 510 mg via INTRAVENOUS
  Filled 2020-11-09: qty 17

## 2020-11-09 NOTE — Progress Notes (Signed)
Pt transported to MRI 

## 2020-11-09 NOTE — Progress Notes (Addendum)
Pt returned from MRI. RR noted to be elevated in high 30s, as well as blood pressure 187/76, pt in SpO2 100 on 2 L [place]. VS that were taken right prior pt got transported to MRI showed temp of 100.5 that has resolved w/o meds.  Pt wheezing bilaterally in all lobes.  Denies SOB and chest pain, no distress noted.  Spoke to MD, and new orders for blood culture were placed as well as nebulizer treatment, and continue Cpap. Will continue to monitor pt and treat per MD orders.

## 2020-11-09 NOTE — TOC Initial Note (Signed)
Transition of Care [facility]) - Initial/Assessment Note    Patient Details  Name: Mason Patton MRN: [id number] Date of Birth: 11/12/1945  Transition of Care [facility]) CM/SW Contact:    Joanne Chars, LCSW Phone Number: 11/09/2020, 1:33 PM  Clinical Narrative:   CSW met with pt and wife to discuss discharge recommendations.  Pt asleep when CSW arrived, wife woke him and he gave permission for CSW to speak with wife present, but shortly fell back to sleep, most information from wife.  Pt readmitted after being discharged 2 days ago, [facility] was set up but had not yet started.  They would like to move forward with the [facility] referral.  Pt PCP is at [facility]  Pt is vaccinated for covid and has had 2 boosters.  Current equipment in home: walker, cane, shower chair, cpap.                  Expected Discharge Plan: [place] Barriers to Discharge: Continued Medical Work up   Patient Goals and CMS Choice Patient states their goals for this hospitalization and ongoing recovery are:: breath better, stronger, more independent CMS Medicare.gov Compare Post Acute Care list provided to::  (Pt already active with [facility], wants to continue)    Expected Discharge Plan and Services Expected Discharge Plan: [place] Acute Care Choice: [place] arrangements for the past 2 months: Single Family Home                                      Prior Living Arrangements/Services Living arrangements for the past 2 months: Single Family Home Lives with:: Spouse,Adult Children (one son) Patient language and need for interpreter reviewed:: Yes        Need for Family Participation in Patient Care: Yes (Comment) Care giver support system in place?: Yes (comment) Current home services: Home PT,DME (cpap) Criminal Activity/Legal Involvement Pertinent to Current Situation/Hospitalization: No - Comment as needed  Activities of Daily  Living Home Assistive Devices/Equipment: Walker (specify type) ADL Screening (condition at time of admission) Patient's cognitive ability adequate to safely complete daily activities?: Yes Is the patient deaf or have difficulty hearing?: No Does the patient have difficulty seeing, even when wearing glasses/contacts?: No Does the patient have difficulty concentrating, remembering, or making decisions?: Yes Patient able to express need for assistance with ADLs?: No Does the patient have difficulty dressing or bathing?: No Independently performs ADLs?: No Communication: Appropriate for developmental age Does the patient have difficulty walking or climbing stairs?: Yes Weakness of Legs: Both Weakness of Arms/Hands: Both  Permission Sought/Granted Permission sought to share information with : Family Supports Permission granted to share information with : Yes, Verbal Permission Granted  Share Information with NAME: wife Katharine Look           Emotional Assessment Appearance:: Appears stated age Attitude/Demeanor/Rapport: Other (comment) (sleepy) Affect (typically observed): Appropriate Orientation: : Oriented to Self,Oriented to Place,Oriented to Situation Alcohol / Substance Use: Not Applicable Psych Involvement: No (comment)  Admission diagnosis:  Pneumonia [J18.9] Acute respiratory failure with hypoxia ([place]) [J96.01] Patient Active Problem List   Diagnosis Date Noted  . Pneumonia 11/08/2020  . Acute respiratory failure with hypoxia ([place])   . Acute CHF ([place]) 11/06/2020  . Dyspnea 11/05/2020  . Acute exacerbation of CHF (congestive heart failure) ([place]) 11/05/2020  . Coronary artery disease  09/11/2020  . Aortic stenosis, severe 09/07/2020  . Unstable angina ([place]) 09/06/2020  . Arthritis of right knee 11/24/2019  . Unilateral primary osteoarthritis, right knee   . Uncontrolled type 2 diabetes mellitus with peripheral neuropathy ([place]) 03/27/2015  . Postablative hypothyroidism  03/27/2015  . CVA (cerebral infarction) 11/16/2013  . Dizziness 11/15/2013  . Left sided lacunar infarction ([place]) 11/15/2013  . Lacunar infarction ([place]) 06/01/2013  . Acute, but ill-defined, cerebrovascular disease 06/01/2013  . 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 Acute CVA (cerebrovascular accident) ([place]) 03/29/2013  . Numbness and tingling 03/27/2013  . Hyperglycemia 03/26/2013  . Meningitis 03/25/2013  . Encephalopathy acute 03/23/2013  . Fever 03/23/2013  . OSA on CPAP 03/23/2013  . Obesity 03/23/2013  . Murmur 03/16/2013  . Preoperative cardiovascular examination 03/16/2013  . Dyslipidemia 03/16/2013  . Hypertension   . Cholecystitis with cholelithiasis   . Apnea, sleep 01/23/2010   PCP:  Heywood Bene, PA-C Pharmacy:   [id number] [place], [place] - [address] [place] [id number]: [phone]: [address]Phone: [phone]: [phone] [place]Phone: [phone]: [phone]    Social Determinants of Health (SDOH) Interventions    Readmission Risk Interventions Readmission Risk Prevention Plan 11/09/2020 11/06/2020  Transportation Screening Complete Complete  PCP or Specialist Appt within 3-5 Days - Complete  [place]RI or [place] Complete  Social Work Consult for [place]anning/Counseling Complete Complete  Palliative Care Screening Not Applicable Not Applicable  Medication Review (Press photographer - Complete  Some recent data might be hidden

## 2020-11-09 NOTE — Evaluation (Signed)
Occupational Therapy Evaluation Patient Details Name: Mason Patton MRN: [id number] DOB: February 06, 1946 Today's Date: 11/09/2020    History of Present Illness 75 year old man presented 11/08/20 due to confusion and fall OOB. Had been discharged 11/07/20 after treated for pna vs COPD exacerbation. CT head was negative. CT chest 5/4: Small left pleural effusion  PMH- heart failure, CAD s/p CABG, aortic stenosis status post AVR, persistent atrial fibrillation (on Eliquis), OSA (on CPAP), T2DM, COPD, prior CVA   Clinical Impression   Pt was walking without a device and assisted for LB ADL and all IADL prior to admission. Pt presents with decreased activity tolerance with increased WOB although Sp02 remained 92-100% throughout session. He demonstrates weakness, requiring moderate assistance to stand from chair and min assist with RW to transfer. Pt needs set up to total assist for ADL. He is having periods of confusion.  Pt's wife in room and supportive, plans to take pt home upon discharge.     Follow Up Recommendations  Home health OT;Supervision/Assistance - 24 hour    Equipment Recommendations  None recommended by OT    Recommendations for Other Services       Precautions / Restrictions Precautions Precautions: Fall      Mobility Bed Mobility Overal bed mobility: Needs Assistance Bed Mobility: Sit to Supine    Sit to supine: Mod assist   General bed mobility comments: assisted LEs back into bed    Transfers Overall transfer level: Needs assistance Equipment used: Rolling walker (2 wheeled) Transfers: Sit to/from Stand Sit to Stand: Mod assist         General transfer comment: use of momentum, heavy reliance on UEs assist to rise and steady    Balance Overall balance assessment: Needs assistance Sitting-balance support: No upper extremity supported;Feet supported Sitting balance-Leahy Scale: Fair     Standing balance support: Bilateral upper extremity supported Standing  balance-Leahy Scale: Poor                             ADL either performed or assessed with clinical judgement   ADL Overall ADL's : Needs assistance/impaired Eating/Feeding: Set up;Sitting   Grooming: Supervision/safety;Sitting   Upper Body Bathing: Minimal assistance;Sitting   Lower Body Bathing: Total assistance;Sit to/from stand   Upper Body Dressing : Minimal assistance;Sitting   Lower Body Dressing: Total assistance;Sit to/from stand   Toilet Transfer: Minimal assistance;Stand-pivot;RW   Toileting- Clothing Manipulation and Hygiene: Total assistance;Sit to/from stand         General ADL Comments: supportive wife at bedside, plans to care for pt at home     Vision Baseline Vision/History: Wears glasses Patient Visual Report: No change from baseline       Perception     Praxis      Pertinent Vitals/Pain Pain Assessment: No/denies pain     Hand Dominance Right   Extremity/Trunk Assessment Upper Extremity Assessment Upper Extremity Assessment: Generalized weakness   Lower Extremity Assessment Lower Extremity Assessment: Defer to PT evaluation       Communication Communication Communication: HOH   Cognition Arousal/Alertness: Awake/alert Behavior During Therapy: Anxious;Flat affect (pt immediately wanting CPAP when returne to bed) Overall Cognitive Status: Impaired/Different from baseline Area of Impairment: Memory;Following commands;Safety/judgement;Awareness;Problem solving;Attention                   Current Attention Level: Sustained Memory: Decreased short-term memory Following Commands: Follows one step commands consistently;Follows one step commands with increased time  Safety/Judgement: Decreased awareness of deficits   Problem Solving: Slow processing;Decreased initiation;Requires verbal cues General Comments: educated pt's wife in delirium prevention   General Comments      Exercises     Shoulder Instructions       Home Living Family/patient expects to be discharged to:: Private residence Living Arrangements: Spouse/significant other Available Help at Discharge: Family;Available 24 hours/day Type of Home: House Home Access: Stairs to enter [employer] of Steps: 3 Entrance Stairs-Rails: Right;Left;Can reach both Home Layout: One level     Bathroom Shower/Tub: Producer, television/film/video: Handicapped height     Home Equipment: Cane - single point;Walker - 2 wheels;Grab bars - tub/shower;Shower seat;Other (comment);Adaptive equipment (lift cair) Adaptive Equipment: Sock aid        Prior Functioning/Environment Level of Independence: Needs assistance  Gait / Transfers Assistance Needed: Indep with ambulation without DME (prior to recent hospitalization) ADL's / Homemaking Assistance Needed: assisted for LB bathing and dressing and all IADL           OT Problem List: Decreased strength;Impaired balance (sitting and/or standing);Decreased cognition;Decreased safety awareness;Decreased activity tolerance;Decreased knowledge of use of DME or AE;Cardiopulmonary status limiting activity      OT Treatment/Interventions: Self-care/ADL training;Patient/family education;Therapeutic activities;DME and/or AE instruction;Energy conservation;Cognitive remediation/compensation;Balance training    OT Goals(Current goals can be found in the care plan section) Acute Rehab OT Goals Patient Stated Goal: Return home OT Goal Formulation: With patient/family Time For Goal Achievement: 11/23/20 Potential to Achieve Goals: Good ADL Goals Pt Will Perform Grooming: with min guard assist;standing Pt Will Perform Upper Body Dressing: with supervision;sitting Pt Will Perform Lower Body Dressing: with min assist;sit to/from stand;with adaptive equipment Pt Will Transfer to Toilet: with min guard assist;ambulating;bedside commode (over toilet) Pt Will Perform Toileting - Clothing Manipulation and  hygiene: with min guard assist;sit to/from stand Additional ADL Goal #1: Pt will perform bed mobility with supervision in preparation for ADL. Additional ADL Goal #2: Pt will state at least 3 energy conservation strategies during ADL and demonstrated pursed lip breathing technique during exertion.  OT Frequency: Min 2X/week   Barriers to D/C:            Co-evaluation              AM-PAC OT "6 Clicks" Daily Activity     Outcome Measure Help from another person eating meals?: A Little Help from another person taking care of personal grooming?: A Little Help from another person toileting, which includes using toliet, bedpan, or urinal?: Total Help from another person bathing (including washing, rinsing, drying)?: A Lot Help from another person to put on and taking off regular upper body clothing?: A Little Help from another person to put on and taking off regular lower body clothing?: Total 6 Click Score: 13   End of Session Equipment Utilized During Treatment: Gait belt;Rolling walker  Activity Tolerance: Patient limited by fatigue Patient left: in bed;with call bell/phone within reach;with bed alarm set;with family/visitor present  OT Visit Diagnosis: Unsteadiness on feet (R26.81);Other abnormalities of gait and mobility (R26.89);Muscle weakness (generalized) (M62.81);Other symptoms and signs involving cognitive function                Time: 1120-1139 OT Time Calculation (min): 19 min Charges:  OT General Charges $OT Visit: 1 Visit OT Evaluation $OT Eval Moderate Complexity: 1 Mod  Martie Round, OTR/L Acute Rehabilitation Services Pager: [phone] Office: [phone]  Evern Bio 11/09/2020, 1:49 PM

## 2020-11-09 NOTE — Evaluation (Signed)
Physical Therapy Evaluation Patient Details Name: Mason Patton MRN: [id number] DOB: [birth year] Today's Date: 11/09/2020   History of Present Illness  75 year old man presented 11/08/20 due to confusion and fall OOB. Had been discharged 11/07/20 after treated for pna vs COPD exacerbation. CT head was negative. CT chest 5/4: Small left pleural effusion  PMH- heart failure, CAD s/p CABG, aortic stenosis status post AVR, persistent atrial fibrillation (on Eliquis), OSA (on CPAP), T2DM, COPD, prior CVA  Clinical Impression   Pt admitted secondary to problem above with deficits below. PTA patient was recently hospitalized and returned home for very short period with 1 fall during that time. Before this, he was independently walking with no device. Pt currently requires min assist and use of RW and only tolerated 3 ft due to dyspnea. (sats on room air 93-100% throughout session). Discussed possible need for residential therapy prior to discharge home, however neither patient or wife are interested in this plan. Discussed we will continue to assess his abilities and safest discharge plan while here. Will continue to follow acutely to maximize functional mobility independence and safety.       Follow Up Recommendations Home health PT;Supervision for mobility/OOB (pt does not want SNF)    Equipment Recommendations  None recommended by PT    Recommendations for Other Services       Precautions / Restrictions Precautions Precautions: Fall      Mobility  Bed Mobility Overal bed mobility: Needs Assistance Bed Mobility: Rolling;Sidelying to Sit Rolling: Min assist Sidelying to sit: Min assist;HOB elevated       General bed mobility comments: using rail, needed assist to fully roll onto side before trying to take legs over EOB (to decr energy expenditure with come to sit); assist to raise torso    Transfers Overall transfer level: Needs assistance Equipment used: None Transfers: Sit to/from  Stand Sit to Stand: Min assist         General transfer comment: initial attempt with minguard assist not successful; min assist to come to stand with wide BOS  Ambulation/Gait Ambulation/Gait assistance: Min assist Gait Distance (Feet): 3 Feet Assistive device: None Gait Pattern/deviations: Step-to pattern   Gait velocity interpretation: <1.31 ft/sec, indicative of household ambulator General Gait Details: small steps around to chair  Stairs            Wheelchair Mobility    Modified Rankin (Stroke Patients Only)       Balance Overall balance assessment: Needs assistance Sitting-balance support: No upper extremity supported;Feet supported Sitting balance-Leahy Scale: Fair     Standing balance support: No upper extremity supported Standing balance-Leahy Scale: Fair                               Pertinent Vitals/Pain Pain Assessment: No/denies pain    Home Living Family/patient expects to be discharged to:: Private residence Living Arrangements: Spouse/significant other Available Help at Discharge: Family;Available 24 hours/day Type of Home: House Home Access: Stairs to enter Entrance Stairs-Rails: Right;Left;Can reach both Entrance Stairs-Number of Steps: 3 Home Layout: One level Home Equipment: Cane - single point;Walker - 2 wheels;Grab bars - tub/shower;Shower seat      Prior Function Level of Independence: Needs assistance   Gait / Transfers Assistance Needed: Indep with ambulation without DME (prior to recent hospitalization)  ADL's / Homemaking Assistance Needed: Indep for majority of ADLs, although wife assists with lower body dressing  Comments: Does not wear  O2 at home. Wife reports pt was about to start working with outpatient cardiac rehab, but this had not been scheduled yet     Hand Dominance   Dominant Hand: Right    Extremity/Trunk Assessment   Upper Extremity Assessment Upper Extremity Assessment: Generalized  weakness    Lower Extremity Assessment Lower Extremity Assessment: Generalized weakness       Communication   Communication: HOH  Cognition Arousal/Alertness: Awake/alert Behavior During Therapy: Anxious (pt denies, but wife stating he wants to wear CPAP all the time because he gets anxious about his breathing) Overall Cognitive Status: Impaired/Different from baseline Area of Impairment: Memory;Following commands;Safety/judgement;Awareness;Problem solving                   Current Attention Level: Sustained Memory: Decreased short-term memory (does not recall how he fell OOB) Following Commands: Follows one step commands consistently;Follows one step commands with increased time Safety/Judgement: Decreased awareness of deficits   Problem Solving: Slow processing;Decreased initiation;Requires verbal cues        General Comments General comments (skin integrity, edema, etc.): DOE 3/4; sats on room air generally 98-100% (at lowest 93%)    Exercises     Assessment/Plan    PT Assessment Patient needs continued PT services  PT Problem List Decreased strength;Decreased activity tolerance;Decreased balance;Decreased mobility;Decreased cognition;Cardiopulmonary status limiting activity       PT Treatment Interventions DME instruction;Gait training;Stair training;Functional mobility training;Therapeutic activities;Therapeutic exercise;Balance training;Patient/family education;Cognitive remediation    PT Goals (Current goals can be found in the Care Plan section)  Acute Rehab PT Goals Patient Stated Goal: Return home PT Goal Formulation: With patient/family Time For Goal Achievement: 11/23/20 Potential to Achieve Goals: Good    Frequency Min 3X/week   Barriers to discharge        Co-evaluation               AM-PAC PT "6 Clicks" Mobility  Outcome Measure Help needed turning from your back to your side while in a flat bed without using bedrails?: A  Little Help needed moving from lying on your back to sitting on the side of a flat bed without using bedrails?: A Little Help needed moving to and from a bed to a chair (including a wheelchair)?: A Little Help needed standing up from a chair using your arms (e.g., wheelchair or bedside chair)?: A Little Help needed to walk in hospital room?: A Little Help needed climbing 3-5 steps with a railing? : A Lot 6 Click Score: 17    End of Session Equipment Utilized During Treatment: Gait belt Activity Tolerance: Patient limited by fatigue;Treatment limited secondary to medical complications (Comment) (dyspnea) Patient left: in chair;with call bell/phone within reach;with family/visitor present (chair alarm missing battery door and non-functional) Nurse Communication: Mobility status PT Visit Diagnosis: Other abnormalities of gait and mobility (R26.89);Muscle weakness (generalized) (M62.81)    Time: 1740-8144 PT Time Calculation (min) (ACUTE ONLY): 25 min   Charges:   PT Evaluation $PT Eval Moderate Complexity: 1 Mod PT Treatments $Therapeutic Activity: 8-22 mins         Jerolyn Center, PT Pager [phone]   Zena Amos 11/09/2020, 11:17 AM

## 2020-11-09 NOTE — Plan of Care (Signed)
  Problem: Clinical Measurements: Goal: Ability to maintain clinical measurements within normal limits will improve Outcome: Progressing Goal: Diagnostic test results will improve Outcome: Progressing Goal: Respiratory complications will improve Outcome: Progressing Goal: Cardiovascular complication will be avoided Outcome: Progressing   Problem: Nutrition: Goal: Adequate nutrition will be maintained Outcome: Progressing   Problem: Safety: Goal: Ability to remain free from injury will improve Outcome: Progressing   Problem: Skin Integrity: Goal: Risk for impaired skin integrity will decrease Outcome: Progressing

## 2020-11-09 NOTE — Progress Notes (Signed)
Subjective:  HD 2  Overnight, no acute events.   This morning, reports feeling better. Per wife, mental status is improved. Per wife, possibly has blood in urine and urine looks darker to her. Patient denies any headaches, vision changes, cough, trouble breathing, neck pain, chest pain, n/v, abdominal pain, dysuria.  Objective:  Vital signs in last 24 hours: Vitals:   11/08/20 1929 11/08/20 2331 11/09/20 0509 11/09/20 0548  BP: (!) 151/61  (!) 159/59   Pulse: 67 63 72   Resp:  19 20   Temp: 97.8 F (36.6 C)  97.7 F (36.5 C)   TempSrc: Oral  Axillary   SpO2: 100% 99% 98%   Weight:    109.1 kg  Height:       Physical Exam: General: Laying in bed with CPAP on, no acute distress CV: Regular rate, irregular rhythm. Unable to discern m/r/g given CPAP Pulm: On CPAP, normal work of breathing without use accessory muscles Abdomen: Soft, non-distended, non-tender. MSK: No pitting edema bilaterally. Skin: No wounds, rashes or lesions on legs, heels, back, abdominal folds, groin Neuro: Awake, alert. Responding to questions appropriately.  CBC Latest Ref Rng & Units 11/09/2020 11/08/2020 11/07/2020  WBC 4.0 - 10.5 K/uL 8.1 9.7 7.2  Hemoglobin 13.0 - 17.0 g/dL 7.5(L) 8.1(L) 7.7(L)  Hematocrit 39.0 - 52.0 % 25.7(L) 28.0(L) 25.7(L)  Platelets 150 - 400 K/uL 169 219 186   BMP Latest Ref Rng & Units 11/09/2020 11/08/2020 11/07/2020  Glucose 70 - 99 mg/dL 88 76 962(E)  BUN 8 - 23 mg/dL 9 11 12   Creatinine 0.61 - 1.24 mg/dL 3.66 2.94  BUN/Creat Ratio 10 - 24 - - -  Sodium 135 - 145 mmol/L 136 136 135  Potassium 3.5 - 5.1 mmol/L 4.4 4.2 3.9  Chloride 98 - 111 mmol/L 103 100 99  CO2 22 - 32 mmol/L 23 26 27   Calcium 8.9 - 10.3 mg/dL 7.65) 9.0 )   Assessment/Plan: Mason Patton is 75yo cismale living with chronic combined systolic and diastolic heart failure, CAD s/p CABG, aortic stenosis s/p AVR, persistent atrial fibrillation on Eliquis, OSA on CPAP, T2DM, COPD, prior CVA admitted  5/4 for continued dyspnea, fevers, and altered mental status with overall negative work-up thus far.  Active Problems:   Pneumonia  #Dyspnea w/ fever #Delirium vs altered mental status Overnight patient remained afebrile and hemodynamically stable. TTE yesterday without signs of vegetations. On exam this morning, no open wounds on skin concerning for infection. His mental status appears improved, more clear today than yesterday. BCx continue to be negative. Given there is a reported color change in urine will repeat UA, although suspect this is possibly degree of dehydration vs mild trauma from catheter. Will obtain MRI brain today. Otherwise, since there is no obvious source of infection, he has remained afebrile since admission, and no leukocytosis, plan to hold all antibiotics and antipyretics. If patient spikes fever will further assess for infectious work-up. Patient is very weak, likely will need a few days of physical therapy during this time.  - Stop azithromycin, Unasyn, acetaminophen, NSAIDs - F/u MRI brain - F/u UA - Duonebs q4h PRN - Guaifenesin 600mg  BID - PT/OT  #Chronic combined systolic and diastolic heart failure Patient appears euvolemic s/p IV lasix given yesterday today. 700cc UOP recorded over last 24hr. Will continue to monitor for signs of hypervolemia. - C/w home carvedilol, losartan - Strict I/O - Daily weights - F/u BMP, Mg  #Chronic persistent atrial fibrillation Continues to be  rate controlled, no change in medications at this time. - Eliquis 5mg  BID, coreg 12.5mg  BID  DIET: HH IVF: n/a DVT PPX: home Eliquis  BOWEL: Miralax CODE: FULL FAM COM: n/a  Prior to Admission Living Arrangement: Home Anticipated Discharge Location: pending PT/OT Barriers to Discharge: medical management Dispo: Anticipated discharge in approximately 1-3 day(s).   , MD 11/09/2020, 6:32 AM Pager: [phone] After 5pm on weekdays and 1pm on weekends: On Call  pager [phone]

## 2020-11-10 DIAGNOSIS — I5042 Chronic combined systolic (congestive) and diastolic (congestive) heart failure: Secondary | ICD-10-CM | POA: Diagnosis not present

## 2020-11-10 DIAGNOSIS — F05 Delirium due to known physiological condition: Secondary | ICD-10-CM

## 2020-11-10 DIAGNOSIS — J189 Pneumonia, unspecified organism: Secondary | ICD-10-CM

## 2020-11-10 DIAGNOSIS — R531 Weakness: Secondary | ICD-10-CM

## 2020-11-10 DIAGNOSIS — D509 Iron deficiency anemia, unspecified: Secondary | ICD-10-CM | POA: Diagnosis not present

## 2020-11-10 DIAGNOSIS — I4819 Other persistent atrial fibrillation: Secondary | ICD-10-CM | POA: Diagnosis not present

## 2020-11-10 LAB — BASIC METABOLIC PANEL
Anion gap: 8 (ref 5–15)
BUN: 6 mg/dL — ABNORMAL LOW (ref 8–23)
CO2: 26 mmol/L (ref 22–32)
Calcium: 8.8 mg/dL — ABNORMAL LOW (ref 8.9–10.3)
Chloride: 100 mmol/L (ref 98–111)
Creatinine, Ser: 0.92 mg/dL (ref 0.61–1.24)
GFR, Estimated: >60 mL/min (ref >60)
Glucose, Bld: 123 mg/dL — ABNORMAL HIGH (ref 70–99)
Potassium: 3.6 mmol/L (ref 3.5–5.1)
Sodium: 134 mmol/L — ABNORMAL LOW (ref 135–145)

## 2020-11-10 LAB — GLUCOSE, CAPILLARY
Glucose-Capillary: 118 mg/dL — ABNORMAL HIGH (ref 70–99)
Glucose-Capillary: 170 mg/dL — ABNORMAL HIGH (ref 70–99)
Glucose-Capillary: 117 mg/dL — ABNORMAL HIGH (ref 70–99)
Glucose-Capillary: 112 mg/dL — ABNORMAL HIGH (ref 70–99)

## 2020-11-10 LAB — CBC
HCT: 27.2 % — ABNORMAL LOW (ref 39.0–52.0)
Hemoglobin: 8.1 g/dL — ABNORMAL LOW (ref 13.0–17.0)
MCH: 22.4 pg — ABNORMAL LOW (ref 26.0–34.0)
MCHC: 29.8 g/dL — ABNORMAL LOW (ref 30.0–36.0)
MCV: 75.1 fL — ABNORMAL LOW (ref 80.0–100.0)
Platelets: 218 10*3/uL (ref 150–400)
RBC: 3.62 MIL/uL — ABNORMAL LOW (ref 4.22–5.81)
RDW: 20.1 % — ABNORMAL HIGH (ref 11.5–15.5)
WBC: 8 10*3/uL (ref 4.0–10.5)
nRBC: 0 % (ref 0.0–0.2)

## 2020-11-10 LAB — URINALYSIS, ROUTINE W REFLEX MICROSCOPIC
Bacteria, UA: NONE SEEN
Bilirubin Urine: NEGATIVE
Glucose, UA: NEGATIVE mg/dL
Ketones, ur: 5 mg/dL — AB
Leukocytes,Ua: NEGATIVE
Nitrite: NEGATIVE
Protein, ur: NEGATIVE mg/dL
RBC / HPF: >50 RBC/hpf — ABNORMAL HIGH (ref 0–5)
Specific Gravity, Urine: 1.01 (ref 1.005–1.030)
pH: 6 (ref 5.0–8.0)

## 2020-11-10 LAB — TYPE AND SCREEN
ABO/RH(D): A POS
Antibody Screen: NEGATIVE

## 2020-11-10 LAB — CULTURE, BLOOD (ROUTINE X 2)
Culture: NO GROWTH
Culture: NO GROWTH
Culture: NO GROWTH
Culture: NO GROWTH
Special Requests: ADEQUATE
Special Requests: ADEQUATE
Special Requests: ADEQUATE
Special Requests: ADEQUATE

## 2020-11-10 LAB — CK: Total CK: 37 U/L — ABNORMAL LOW (ref 49–397)

## 2020-11-10 MED ORDER — FERROUS SULFATE 325 (65 FE) MG PO TABS: 325.0000 mg | ORAL_TABLET | Freq: Every day | ORAL | Status: DC

## 2020-11-10 MED ORDER — FUROSEMIDE 10 MG/ML IJ SOLN
40.0000 mg | Freq: Once | INTRAMUSCULAR | Status: AC
  Administered 2020-11-10: 40 mg via INTRAVENOUS
  Filled 2020-11-10: qty 4

## 2020-11-10 NOTE — Progress Notes (Signed)
  Speech Language Pathology Treatment: Dysphagia  Patient Details Name: Mason Patton MRN: [id number] DOB: 08-04-1945 Today's Date: 11/10/2020 Time: 4037-5436 SLP Time Calculation (min) (ACUTE ONLY): 14.08 min  Assessment / Plan / Recommendation Clinical Impression  Pt was seen for dysphagia treatment and was cooperative throughout the session. Pt and his son reported that the pt has been tolerating the current diet without overt s/sx of aspiration. Pt reported that he has not been eating much  since many foods do not taste good. Pt's family has brought foods in, but stated that this has not significantly  improved p.o. intake. Pt tolerated regular texture solids, and thin liquids using individual and consecutive swallows without symptoms of oropharyngeal dysphagia. It is recommended that the current diet be continued. Further skilled SLP services are not clinically indicated at this time.    HPI HPI: Pt is a 75 year old man with past medical history significant for chronic combined systolic and diastolic heart failure, CAD s/p CABG, aortic stenosis status post AVR, persistent atrial fibrillation (on Eliquis), OSA (on CPAP), T2DM, COPD, prior CVA, iron deficiency anemia, hypothyroidism and recent admission from 11/05/20 to 11/07/20 for acute on chronic HF exacerbation in setting of pneumonia who presented to [facility] on [facility]08/20 for shortness of breath and altered mental status. CT head was negative for acute changes. CT soft tissue neck: no source of infection is identified within the neck. Cervical spondylosis. CT chest 5/4: Small left pleural effusion with passive atelectasis, but no  compelling findings of active pneumonia. SLP consulted " to ensure no signs of aspiration."      SLP Plan  All goals met       Recommendations  Diet recommendations: Regular;Thin liquid Liquids provided via: Cup;Straw Medication Administration: Whole meds with liquid Supervision: Patient able to self  feed Compensations: Slow rate;Small sips/bites (rest breaks if dyspneic) Postural Changes and/or Swallow Maneuvers: Seated upright 90 degrees                Oral Care Recommendations: Oral care BID Follow up Recommendations: None SLP Visit Diagnosis: Dysphagia, unspecified (R13.10) Plan: All goals met       Ramonita Koenig I. Hardin Negus, [place], [place] Office number [phone] Pager [phone]                Horton Marshall 11/10/2020, 11:05 AM

## 2020-11-10 NOTE — Progress Notes (Signed)
Occupational Therapy Treatment Patient Details Name: Mason Patton MRN: [id number] DOB: 10-25-1945 Today's Date: 11/10/2020    History of present illness 75 year old man presented 11/08/20 due to confusion and fall OOB. Had been discharged 11/07/20 after treated for pna vs COPD exacerbation. CT head was negative. CT chest 5/4: Small left pleural effusion  PMH- heart failure, CAD s/p CABG, aortic stenosis status post AVR, persistent atrial fibrillation (on Eliquis), OSA (on CPAP), T2DM, COPD, prior CVA   OT comments  Pt more alert and interacting this visit. Readily willing to get OOB to chair with mod assist to stand and min assist with RW and extra time to pivot to chair. Pt requiring total assist for socks and min assist to change gown. Able to self feed, but appetite remains poor. Pt with Sp02 94-96% on RA, RN made aware.   Follow Up Recommendations  Home health OT;Supervision/Assistance - 24 hour    Equipment Recommendations  None recommended by OT    Recommendations for Other Services      Precautions / Restrictions Precautions Precautions: Fall       Mobility Bed Mobility Overal bed mobility: Needs Assistance Bed Mobility: Supine to Sit   Sidelying to sit: Min assist;HOB elevated       General bed mobility comments: min assist for hips to EOB, increased time    Transfers Overall transfer level: Needs assistance Equipment used: Rolling walker (2 wheeled) Transfers: Sit to/from [employer] Sit to Stand: Mod assist;From elevated surface         General transfer comment: assist to rise and to steady as he moved his hand from bed to walker    Balance Overall balance assessment: Needs assistance   Sitting balance-Leahy Scale: Poor Sitting balance - Comments: LOB posterior and to L   Standing balance support: Bilateral upper extremity supported Standing balance-Leahy Scale: Poor Standing balance comment: unable to release walker in static standing                            ADL either performed or assessed with clinical judgement   ADL Overall ADL's : Needs assistance/impaired Eating/Feeding: Set up;Sitting   Grooming: Wash/dry hands;Sitting;Set up           Upper Body Dressing : Minimal assistance;Sitting   Lower Body Dressing: Total assistance;Bed level                       Vision       Perception     Praxis      Cognition Arousal/Alertness: Awake/alert Behavior During Therapy: WFL for tasks assessed/performed Overall Cognitive Status: Impaired/Different from baseline Area of Impairment: Following commands;Problem solving                       Following Commands: Follows one step commands with increased time     Problem Solving: Slow processing;Decreased initiation;Requires verbal cues General Comments: improved cognition from yesterday, more alert and interactive        Exercises     Shoulder Instructions       General Comments      Pertinent Vitals/ Pain       Pain Assessment: Faces Faces Pain Scale: Hurts little more Pain Location: back Pain Descriptors / Indicators: Aching Pain Intervention(s): Repositioned;Monitored during session  Home Living  Prior Functioning/Environment              Frequency  Min 2X/week        Progress Toward Goals  OT Goals(current goals can now be found in the care plan section)  Progress towards OT goals: Progressing toward goals  Acute Rehab OT Goals Patient Stated Goal: Return home OT Goal Formulation: With patient/family Time For Goal Achievement: 11/23/20 Potential to Achieve Goals: Good  Plan Discharge plan remains appropriate    Co-evaluation                 AM-PAC OT "6 Clicks" Daily Activity     Outcome Measure   Help from another person eating meals?: A Little Help from another person taking care of personal grooming?: A Little Help  from another person toileting, which includes using toliet, bedpan, or urinal?: Total Help from another person bathing (including washing, rinsing, drying)?: A Lot Help from another person to put on and taking off regular upper body clothing?: A Little Help from another person to put on and taking off regular lower body clothing?: Total 6 Click Score: 13    End of Session Equipment Utilized During Treatment: Gait belt;Rolling walker  OT Visit Diagnosis: Unsteadiness on feet (R26.81);Other abnormalities of gait and mobility (R26.89);Muscle weakness (generalized) (M62.81);Other symptoms and signs involving cognitive function   Activity Tolerance Patient tolerated treatment well   Patient Left in chair;with call bell/phone within reach;with family/visitor present (ST in room)   Nurse Communication Mobility status;Other (comment) (02 sats in min 90s)        Time: 1030-1057 OT Time Calculation (min): 27 min  Charges: OT General Charges $OT Visit: 1 Visit OT Treatments $Self Care/Home Management : 23-37 mins  Martie Round, OTR/L Acute Rehabilitation Services Pager: [phone] Office: [phone]   Evern Bio 11/10/2020, 11:06 AM

## 2020-11-10 NOTE — Progress Notes (Signed)
`  Subjective:  [name]  Evaluated at bedside this AM. States he is feeling much better, minimal problems breathing. Patient's wife and son at bedside today, raising concerns about discharging too early. Discussed that we do not want to discharge before he is ready and want to make sure he is stable. Also explained we have not found etiology for fevers.   Objective:  Vital signs in last 24 hours: Vitals:   11/09/20 2209 11/10/20 0100 11/10/20 0550 11/10/20 0600  BP: (!) 187/72 (!) 186/91 (!) 161/79 (!) 153/92  Pulse:   72   Resp:   (!) 22   Temp: 98.8 F (37.1 C) 98.5 F (36.9 C) 100.2 F (37.9 C)   TempSrc: Oral Oral Axillary   SpO2:   98%   Weight:   108.1 kg   Height:       Physical Exam: General: Sitting up in chair, no acute distress CV: Regular rate, irregular rhythm. No m/r/g appreciated. Pulm: Normal work of breathing, bibasilar rales without wheezing on auscultation. Neuro: Lethargic, but easily arousable, answering questions appropriately.  CBC Latest Ref Rng & Units 11/10/2020 11/09/2020 11/08/2020  WBC 4.0 - 10.5 K/uL 8.0 8.1 9.7  Hemoglobin 13.0 - 17.0 g/dL 8.1(L) 7.5(L) 8.1(L)  Hematocrit 39.0 - 52.0 % 27.2(L) 25.7(L) 28.0(L)  Platelets 150 - 400 K/uL 218 169 219   BMP Latest Ref Rng & Units 11/10/2020 11/09/2020 11/08/2020  Glucose 70 - 99 mg/dL 595(G) 88 76  BUN 8 - 23 mg/dL 6(L) 9 11  Creatinine 3.87 - 1.24 mg/dL 5.64 3.32 9.51  BUN/Creat Ratio 10 - 24 - - -  Sodium 135 - 145 mmol/L 134(L) 136 136  Potassium 3.5 - 5.1 mmol/L 3.6 4.4 4.2  Chloride 98 - 111 mmol/L 100 103 100  CO2 22 - 32 mmol/L 26 23 26   Calcium 8.9 - 10.3 mg/dL ) 8.8(C) 9.0   Assessment/Plan: Mason Patton is 75yo cismale living with chronic combined systolic and diastolic heart failure, CAD s/p CABG, aortic stenosis s/p AVR, persistent atrial fibrillation on Eliquis, OSA on CPAP, T2DM, COPD, prior CVA admitted 5/4 for dyspnea, fevers, and AMS, negative work-up thus far but appears to be  clinically improving.  Active Problems:   Pneumonia  #Dyspnea, fevers #Delirium vs AMS #Generalized weakness Yesterday patient had low-grade fever 100.7 yesterday, intermittently up to 100.2-100.5 overnight. Patient clinically appears to have improved, sitting up in chair this morning on room air. He is lethargic this morning, but likely due to generalized weakness from 1 week of acute illness. MRI brain yesterday without signs of acute stroke, infection. Will follow-up with r/p UA. Otherwise no obvious source of infection, blood cultures negative x4 thus far. No wheezing on exam today, unlikely COPD exacerbation. Bibasilar rales appreciated, will give another dose IV lasix. Patient also could be dyspneic 2/2 anemia. Believe patient would likely benefit from SNF, but patient and family declined and prefer to be discharged home when appropriate. Likely will need to stay throughout at least the weekend to monitor is mental and respiratory status.  - Holding antibiotics, antipyretics. - IV lasix 40mg   - F/u UA, ANA - DuoNebs q4h PRN - CPAP QHS - PT/OT   #Iron deficiency anemia Hgb has been stable 7.5-8 throughout hospitalization, appears to be somewhat chronic. However, could be contributing to patient's symptoms. He was given 2nd dose Feraheme yesterday. Will continue to monitor CBC, further signs of bleeding. - S/p Feraheme - Daily CBC  #Chronic combined systolic diastolic heart failure Bibasilar rales  on exam today. Possibly heart failure contributing to some of his symptoms, although that would not explain his fevers. Will give an extra dose of IV lasix today, follow-up in AM for further diuresis needs. - IV lasix 40mg  - C/w home coreg, losartan - Strict I/O - Daily weights - F/u BMP, Mg  #Chronic persistent atrial fibrillation Rate-controlled, no chest pain. Will continue home meds. - c/w home Eliquis, coreg  DIET: HH IVF: n/a DVT PPX: home Eliquis BOWEL: Miralax CODE:  FULL FAM COM: n/a  Prior to Admission Living Arrangement: Home Anticipated Discharge Location: pending PT/OT Barriers to Discharge: medical management Dispo: Anticipated discharge in approximately 2-3 day(s).   , MD 11/10/2020, 8:02 AM Pager: [phone] After 5pm on weekdays and 1pm on weekends: On Call pager [phone]

## 2020-11-11 DIAGNOSIS — E890 Postprocedural hypothyroidism: Secondary | ICD-10-CM

## 2020-11-11 DIAGNOSIS — E1165 Type 2 diabetes mellitus with hyperglycemia: Secondary | ICD-10-CM

## 2020-11-11 DIAGNOSIS — A419 Sepsis, unspecified organism: Secondary | ICD-10-CM

## 2020-11-11 DIAGNOSIS — Z9989 Dependence on other enabling machines and devices: Secondary | ICD-10-CM

## 2020-11-11 DIAGNOSIS — G4733 Obstructive sleep apnea (adult) (pediatric): Secondary | ICD-10-CM | POA: Diagnosis not present

## 2020-11-11 DIAGNOSIS — I10 Essential (primary) hypertension: Secondary | ICD-10-CM | POA: Diagnosis not present

## 2020-11-11 LAB — CBC
HCT: 26.1 % — ABNORMAL LOW (ref 39.0–52.0)
Hemoglobin: 7.9 g/dL — ABNORMAL LOW (ref 13.0–17.0)
MCH: 22.8 pg — ABNORMAL LOW (ref 26.0–34.0)
MCHC: 30.3 g/dL (ref 30.0–36.0)
MCV: 75.2 fL — ABNORMAL LOW (ref 80.0–100.0)
Platelets: 184 10*3/uL (ref 150–400)
RBC: 3.47 MIL/uL — ABNORMAL LOW (ref 4.22–5.81)
RDW: 20.6 % — ABNORMAL HIGH (ref 11.5–15.5)
WBC: 7.7 10*3/uL (ref 4.0–10.5)
nRBC: 0 % (ref 0.0–0.2)

## 2020-11-11 LAB — BLOOD CULTURE ID PANEL (REFLEXED) - BCID2

## 2020-11-11 LAB — MAGNESIUM: Magnesium: 1.8 mg/dL (ref 1.7–2.4)

## 2020-11-11 LAB — BASIC METABOLIC PANEL
Anion gap: 9 (ref 5–15)
BUN: 8 mg/dL (ref 8–23)
CO2: 27 mmol/L (ref 22–32)
Calcium: 8.5 mg/dL — ABNORMAL LOW (ref 8.9–10.3)
Chloride: 100 mmol/L (ref 98–111)
Creatinine, Ser: 0.74 mg/dL (ref 0.61–1.24)
GFR, Estimated: >60 mL/min (ref >60)
Glucose, Bld: 95 mg/dL (ref 70–99)
Potassium: 3.3 mmol/L — ABNORMAL LOW (ref 3.5–5.1)
Sodium: 136 mmol/L (ref 135–145)

## 2020-11-11 LAB — ANA: Anti Nuclear Antibody (ANA): NEGATIVE

## 2020-11-11 LAB — GLUCOSE, CAPILLARY
Glucose-Capillary: 90 mg/dL (ref 70–99)
Glucose-Capillary: 133 mg/dL — ABNORMAL HIGH (ref 70–99)
Glucose-Capillary: 94 mg/dL (ref 70–99)
Glucose-Capillary: 145 mg/dL — ABNORMAL HIGH (ref 70–99)

## 2020-11-11 MED ORDER — LIDOCAINE 5 % EX PTCH
1.0000 | MEDICATED_PATCH | CUTANEOUS | Status: DC
  Administered 2020-11-11 – 2020-11-15 (×2): 1 via TRANSDERMAL
  Filled 2020-11-11 (×2): qty 1

## 2020-11-11 MED ORDER — VANCOMYCIN HCL 1250 MG/250ML IV SOLN
1250.0000 mg | Freq: Two times a day (BID) | INTRAVENOUS | Status: DC
  Administered 2020-11-11 – 2020-11-14 (×7): 1250 mg via INTRAVENOUS
  Filled 2020-11-11 (×7): qty 250

## 2020-11-11 MED ORDER — POTASSIUM CHLORIDE CRYS ER 20 MEQ PO TBCR
30.0000 meq | EXTENDED_RELEASE_TABLET | Freq: Two times a day (BID) | ORAL | Status: AC
  Administered 2020-11-11 (×2): 30 meq via ORAL
  Filled 2020-11-11 (×2): qty 1

## 2020-11-11 MED ORDER — ACETAMINOPHEN 325 MG PO TABS
650.0000 mg | ORAL_TABLET | Freq: Four times a day (QID) | ORAL | Status: DC | PRN
  Administered 2020-11-11 – 2020-11-12 (×2): 650 mg via ORAL
  Filled 2020-11-11 (×2): qty 2

## 2020-11-11 MED ORDER — VANCOMYCIN HCL 1500 MG/300ML IV SOLN
1500.0000 mg | Freq: Once | INTRAVENOUS | Status: AC
  Administered 2020-11-11: 1500 mg via INTRAVENOUS
  Filled 2020-11-11: qty 300

## 2020-11-11 NOTE — Progress Notes (Signed)
Pharmacy Antibiotic Note  Mason Patton is a 75 y.o. male admitted on 11/08/2020 with SOB and fevers, blood cultures growing MR-Staph epidermidis  Pharmacy has been consulted for Vancomycin dosing.  Plan: Vancomycin 1500 mg IV now, then 1250 mg IV q12h  Height: 5\' 10"  (177.8 cm) Weight: 108.1 kg (238 lb 5.1 oz) IBW/kg (Calculated) : 73  Temp (24hrs), Avg:99 F (37.2 C), Min:97.8 F (36.6 C), Max:100.2 F (37.9 C)  Recent Labs  Lab 11/04/20 1850 11/04/20 2030 11/05/20 0728 11/05/20 0729 11/05/20 0757 11/05/20 0931 11/06/20 0737 11/07/20 0358 11/08/20 0239 11/09/20 0500 11/10/20 0353  WBC 7.8  --    < >  --   --   --  7.1 7.2 9.7 8.1 8.0  CREATININE 0.72  --    < >  --    < >  --  0.82 0.83 0.89 0.76 0.92  LATICACIDVEN 1.0 1.1  --  2.5*  --  1.7  --   --  1.7  --   --    < > = values in this interval not displayed.    Estimated Creatinine Clearance: 86.7 mL/min (by C-G formula based on SCr of 0.92 mg/dL).    Allergies  Allergen Reactions  . Codeine Nausea And Vomiting  . Demerol Nausea And Vomiting    01/10/21 11/11/2020 1:35 AM

## 2020-11-11 NOTE — Progress Notes (Signed)
     Subjective: [name] is feeling better today.  Continues with fatigue and low appetite.  We discussed his culture results today.   Objective:  Vital signs in last 24 hours: Vitals:   11/11/20 0003 11/11/20 0500 11/11/20 0554 11/11/20 0612  BP:    (!) 165/74  Pulse: (!) 48  71 65  Resp: (!) 26  (!) 21 20  Temp:    97.6 F (36.4 C)  TempSrc:    Axillary  SpO2: 95%   97%  Weight:  108.6 kg    Height:       Gen: Awake, alert, eating breakfast Eyes: Anicteric sclerae HENT: Neck is supple, no pain, + clot in right lower mouth at site of recent extraction of tooth CV: Irreg Irreg, normal rate, no murmur, no peripheral edema Pulm: Breathing comfortably, no wheezing Abd: Soft, +BS MSK: Normal tone for age, normal bulk Psych: Normal mood and affect.   Assessment/Plan:  Sepsis (HCC), MR staph epidermidis in 2 bottles Acute respiratory failure with hypoxia - resolved - Last elevated temp 11/10/2020, 100.2 - On vancomycin per pharmacy - Staph Epidermidis, methicillin resistant in 2 bottles - Cardiology contacted for patient to be scheduled for TEE, discussed with patient and wife - Continue to monitor for fever, follow up blood cultures - Respiratory failure resolved, continue oxygen if needed and CPAP at night - WBC 7.7 today - Discontinue droplet precautions    Hypertension - BP waxes and wanes - Continue Coreg, losartan, lasix - Consider increase in medication if BP remains elevated    OSA on CPAP - Continue overnight CPAP use    Uncontrolled type 2 diabetes mellitus with peripheral neuropathy  - Continue metformin - Continue SSI - Monitor dietary intake, should improve with treatment of sepsis.  - Continue statin    Postablative hypothyroidism Iatrogenic hyperthyroidism - Synthroid decreased to at last admission - Recheck blood work in 4-6 weeks  Chronic HFpEF - Continue daily lasix - Monitor I/O  Hypokalemia, mild - Replace with oral K  today  Chronic microcytic anemia Iron deficiency - Starting on iron replacement tomorrow given sepsis diagnosis     Dispo: Anticipated discharge in approximately 3-5 day(s).   Inez Catalina, MD 11/11/2020, 10:31 AM

## 2020-11-11 NOTE — Progress Notes (Signed)
Interim progress note  Received page from patient's RN regarding patient having some back pain, most likely due to resting in bed.  Patient given Tylenol 650 mg as needed (CMP from 5/4 reviewed, normal liver function), K pad and Lidoderm patch.  Nurse instructed not to apply K pad over Lidoderm patch as this will increase absorption.  All questions answered.  Peggyann Shoals, DO [facility], PGY-3 11/11/2020 10:47 PM

## 2020-11-11 NOTE — Progress Notes (Signed)
PHARMACY - PHYSICIAN COMMUNICATION CRITICAL VALUE ALERT - BLOOD CULTURE IDENTIFICATION (BCID)  Mason Patton is an 75 y.o. male who presented to [facility] on 11/08/2020 with a chief complaint of SOB and fevers  Assessment:   2/2 blood cultures collected 5/5 growing MR-Staph epidermidis  Name of physician (or Provider) Contacted: Dr. Cleaster Corin  Current antibiotics:  None  Changes to prescribed antibiotics recommended:  Recommend adding Vancomycin   Results for orders placed or performed during the hospital encounter of 11/08/20  Blood Culture ID Panel (Reflexed) (Collected: 11/09/2020 11:03 PM)  Result Value Ref Range   Enterococcus faecalis NOT DETECTED NOT DETECTED   Enterococcus Faecium NOT DETECTED NOT DETECTED   Listeria monocytogenes NOT DETECTED NOT DETECTED   Staphylococcus species DETECTED (A) NOT DETECTED   Staphylococcus aureus (BCID) NOT DETECTED NOT DETECTED   Staphylococcus epidermidis DETECTED (A) NOT DETECTED   Staphylococcus lugdunensis NOT DETECTED NOT DETECTED   Streptococcus species NOT DETECTED NOT DETECTED   Streptococcus agalactiae NOT DETECTED NOT DETECTED   Streptococcus pneumoniae NOT DETECTED NOT DETECTED   Streptococcus pyogenes NOT DETECTED NOT DETECTED   A.calcoaceticus-baumannii NOT DETECTED NOT DETECTED   Bacteroides fragilis NOT DETECTED NOT DETECTED   Enterobacterales NOT DETECTED NOT DETECTED   Enterobacter cloacae complex NOT DETECTED NOT DETECTED   Escherichia coli NOT DETECTED NOT DETECTED   Klebsiella aerogenes NOT DETECTED NOT DETECTED   Klebsiella oxytoca NOT DETECTED NOT DETECTED   Klebsiella pneumoniae NOT DETECTED NOT DETECTED   Proteus species NOT DETECTED NOT DETECTED   Salmonella species NOT DETECTED NOT DETECTED   Serratia marcescens NOT DETECTED NOT DETECTED   Haemophilus influenzae NOT DETECTED NOT DETECTED   Neisseria meningitidis NOT DETECTED NOT DETECTED   Pseudomonas aeruginosa NOT DETECTED NOT DETECTED   Stenotrophomonas  maltophilia NOT DETECTED NOT DETECTED   Candida albicans NOT DETECTED NOT DETECTED   Candida auris NOT DETECTED NOT DETECTED   Candida glabrata NOT DETECTED NOT DETECTED   Candida krusei NOT DETECTED NOT DETECTED   Candida parapsilosis NOT DETECTED NOT DETECTED   Candida tropicalis NOT DETECTED NOT DETECTED   Cryptococcus neoformans/gattii NOT DETECTED NOT DETECTED   Methicillin resistance mecA/C DETECTED (A) NOT DETECTED    Eddie Candle 11/11/2020  1:15 AM

## 2020-11-11 NOTE — Plan of Care (Signed)
  Problem: Nutrition: Goal: Adequate nutrition will be maintained Outcome: Progressing   Problem: Safety: Goal: Ability to remain free from injury will improve Outcome: Progressing   Problem: Skin Integrity: Goal: Risk for impaired skin integrity will decrease Outcome: Progressing   Problem: Education: Goal: Knowledge of General Education information will improve Description: Including pain rating scale, medication(s)/side effects and non-pharmacologic comfort measures Outcome: Progressing   Problem: Nutrition: Goal: Adequate nutrition will be maintained Outcome: Progressing   Problem: Pain Managment: Goal: General experience of comfort will improve Outcome: Progressing

## 2020-11-12 DIAGNOSIS — G4733 Obstructive sleep apnea (adult) (pediatric): Secondary | ICD-10-CM

## 2020-11-12 DIAGNOSIS — Z9981 Dependence on supplemental oxygen: Secondary | ICD-10-CM

## 2020-11-12 DIAGNOSIS — I10 Essential (primary) hypertension: Secondary | ICD-10-CM | POA: Diagnosis not present

## 2020-11-12 DIAGNOSIS — E039 Hypothyroidism, unspecified: Secondary | ICD-10-CM

## 2020-11-12 DIAGNOSIS — A419 Sepsis, unspecified organism: Secondary | ICD-10-CM | POA: Diagnosis not present

## 2020-11-12 DIAGNOSIS — R509 Fever, unspecified: Secondary | ICD-10-CM

## 2020-11-12 DIAGNOSIS — J9601 Acute respiratory failure with hypoxia: Secondary | ICD-10-CM

## 2020-11-12 DIAGNOSIS — E1142 Type 2 diabetes mellitus with diabetic polyneuropathy: Secondary | ICD-10-CM

## 2020-11-12 LAB — CBC
HCT: 29 % — ABNORMAL LOW (ref 39.0–52.0)
Hemoglobin: 8.4 g/dL — ABNORMAL LOW (ref 13.0–17.0)
MCH: 22.5 pg — ABNORMAL LOW (ref 26.0–34.0)
MCHC: 29 g/dL — ABNORMAL LOW (ref 30.0–36.0)
MCV: 77.7 fL — ABNORMAL LOW (ref 80.0–100.0)
Platelets: 208 10*3/uL (ref 150–400)
RBC: 3.73 MIL/uL — ABNORMAL LOW (ref 4.22–5.81)
RDW: 22.1 % — ABNORMAL HIGH (ref 11.5–15.5)
WBC: 8.2 10*3/uL (ref 4.0–10.5)
nRBC: 0 % (ref 0.0–0.2)

## 2020-11-12 LAB — CULTURE, BLOOD (ROUTINE X 2): Special Requests: ADEQUATE

## 2020-11-12 LAB — BASIC METABOLIC PANEL
Anion gap: 10 (ref 5–15)
BUN: 9 mg/dL (ref 8–23)
CO2: 24 mmol/L (ref 22–32)
Calcium: 8.7 mg/dL — ABNORMAL LOW (ref 8.9–10.3)
Chloride: 103 mmol/L (ref 98–111)
Creatinine, Ser: 0.79 mg/dL (ref 0.61–1.24)
GFR, Estimated: >60 mL/min (ref >60)
Glucose, Bld: 100 mg/dL — ABNORMAL HIGH (ref 70–99)
Potassium: 3.8 mmol/L (ref 3.5–5.1)
Sodium: 137 mmol/L (ref 135–145)

## 2020-11-12 LAB — GLUCOSE, CAPILLARY
Glucose-Capillary: 143 mg/dL — ABNORMAL HIGH (ref 70–99)
Glucose-Capillary: 124 mg/dL — ABNORMAL HIGH (ref 70–99)
Glucose-Capillary: 182 mg/dL — ABNORMAL HIGH (ref 70–99)

## 2020-11-12 MED ORDER — LOSARTAN POTASSIUM 50 MG PO TABS
50.0000 mg | ORAL_TABLET | Freq: Once | ORAL | Status: AC
  Administered 2020-11-12: 50 mg via ORAL
  Filled 2020-11-12: qty 1

## 2020-11-12 MED ORDER — POLYETHYLENE GLYCOL 3350 17 G PO PACK
17.0000 g | PACK | Freq: Every day | ORAL | Status: DC
  Administered 2020-11-12: 17 g via ORAL
  Filled 2020-11-12 (×4): qty 1

## 2020-11-12 MED ORDER — SENNOSIDES-DOCUSATE SODIUM 8.6-50 MG PO TABS
2.0000 | ORAL_TABLET | Freq: Every day | ORAL | Status: DC
  Administered 2020-11-12 – 2020-11-15 (×4): 2 via ORAL
  Filled 2020-11-12 (×4): qty 2

## 2020-11-12 MED ORDER — LOSARTAN POTASSIUM 50 MG PO TABS
100.0000 mg | ORAL_TABLET | Freq: Every day | ORAL | Status: DC
  Administered 2020-11-13 – 2020-11-16 (×4): 100 mg via ORAL
  Filled 2020-11-12 (×4): qty 2

## 2020-11-12 NOTE — Plan of Care (Signed)
  Problem: Clinical Measurements: Goal: Ability to maintain clinical measurements within normal limits will improve Outcome: Progressing Goal: Diagnostic test results will improve Outcome: Progressing Goal: Respiratory complications will improve Outcome: Progressing   Problem: Nutrition: Goal: Adequate nutrition will be maintained Outcome: Progressing   Problem: Safety: Goal: Ability to remain free from injury will improve Outcome: Progressing

## 2020-11-12 NOTE — Plan of Care (Signed)
  Problem: Clinical Measurements: Goal: Ability to maintain clinical measurements within normal limits will improve Outcome: Progressing Goal: Diagnostic test results will improve Outcome: Progressing Goal: Respiratory complications will improve Outcome: Progressing Goal: Cardiovascular complication will be avoided Outcome: Progressing   Problem: Nutrition: Goal: Adequate nutrition will be maintained Outcome: Progressing   Problem: Safety: Goal: Ability to remain free from injury will improve Outcome: Progressing   Problem: Skin Integrity: Goal: Risk for impaired skin integrity will decrease Outcome: Progressing   Problem: Education: Goal: Knowledge of General Education information will improve Description: Including pain rating scale, medication(s)/side effects and non-pharmacologic comfort measures Outcome: Progressing   Problem: Health Behavior/Discharge Planning: Goal: Ability to manage health-related needs will improve Outcome: Progressing   Problem: Clinical Measurements: Goal: Ability to maintain clinical measurements within normal limits will improve Outcome: Progressing Goal: Will remain free from infection Outcome: Progressing   Problem: Activity: Goal: Risk for activity intolerance will decrease Outcome: Progressing   Problem: Nutrition: Goal: Adequate nutrition will be maintained Outcome: Progressing   Problem: Coping: Goal: Level of anxiety will decrease Outcome: Progressing   Problem: Elimination: Goal: Will not experience complications related to bowel motility Outcome: Progressing Goal: Will not experience complications related to urinary retention Outcome: Progressing   Problem: Pain Managment: Goal: General experience of comfort will improve Outcome: Progressing

## 2020-11-12 NOTE — Progress Notes (Signed)
    [facility] has been requested to perform a transesophageal echocardiogram on Mason Patton for bacteremia in the setting of prior AVR.  After careful review of history and examination, the risks and benefits of transesophageal echocardiogram have been explained including risks of esophageal damage, perforation (1:10,000 risk), bleeding, pharyngeal hematoma as well as other potential complications associated with conscious sedation including aspiration, arrhythmia, respiratory failure and death. Alternatives to treatment were discussed, questions were answered. Patient is willing to proceed.   Hgb is low at 8.4 today but similar to prior values this admission. Platelets at 208 K. BP has been stable without the requirement for pressor support.   Would keep NPO after midnight. Will message the Card Master to see if his TEE can be performed tomorrow if scheduling allows.   Ellsworth Lennox, PA-C  11/12/2020 10:21 AM

## 2020-11-12 NOTE — Progress Notes (Addendum)
Subjective:  HD #4 Overnight, patient noted to have some back pain for which eh was given tylenol with heating pad and lidoderm patch.   This morning, Mason Patton was evaluated at bedside Mason Patton states he is doing "horrible". He says he had trouble sleeping due to back pain that improved but didn't resolve with Tylenol and a heating pad. He denies any SOB or any other symptoms. Discussed that he has a staph bacteremia and would benefit from getting further evaluated with TEE. He is agreeable to this at this time.   Objective:  Vital signs in last 24 hours: Vitals:   11/11/20 1724 11/11/20 2000 11/11/20 2100 11/12/20 0500  BP:      Pulse: 62 69    Resp:  17    Temp:   97.6 F (36.4 C)   TempSrc:   Axillary   SpO2:  100%    Weight:    105.9 kg  Height:       CBC Latest Ref Rng & Units 11/12/2020 11/11/2020 11/10/2020  WBC 4.0 - 10.5 K/uL 8.2 7.7 8.0  Hemoglobin 13.0 - 17.0 g/dL 8.4(O) 7.9(L) 8.1(L)  Hematocrit 39.0 - 52.0 % 29.0(L) 26.1(L) 27.2(L)  Platelets 150 - 400 K/uL 208 184 218   BMP Latest Ref Rng & Units 11/12/2020 11/11/2020 11/10/2020  Glucose 70 - 99 mg/dL 962(X) 95 528(U)  BUN 8 - 23 mg/dL 9 8 6(L)  Creatinine 1.32 - 1.24 mg/dL 4.40 1.02 7.25  BUN/Creat Ratio 10 - 24 - - -  Sodium 135 - 145 mmol/L 137 136 134(L)  Potassium 3.5 - 5.1 mmol/L 3.8 3.3(L) 3.6  Chloride 98 - 111 mmol/L 103 100 100  CO2 22 - 32 mmol/L 24 27 26   Calcium 8.9 - 10.3 mg/dL ) 3.6(U) 4.4(I)   Physical Exam  Constitutional: Awake, alert, sitting up in bedside chair, no acute distress  HENT: Normocephalic and atraumatic, EOMI, moist mucous membranes Cardiovascular: Normal rate, irregularly irregular rhythm, S1 and S2 present, no murmurs, rubs, gallops.  Distal pulses intact Respiratory: No respiratory distress, Effort is normal.  Lungs are clear to auscultation bilaterally. On Room Air GI:, soft, nontender to palpation, active bowel sounds Musculoskeletal: Normal bulk and tone.  No peripheral  edema noted. Neurological: Is alert and oriented x4, no apparent focal deficits noted. Skin: Warm and dry.  No rash, erythema, lesions noted.  Assessment/Plan:  Principal Problem:   Sepsis (HCC) Active Problems:   Hypertension   Fever   OSA on CPAP   Uncontrolled type 2 diabetes mellitus with peripheral neuropathy (HCC)   Postablative hypothyroidism   Acute respiratory failure with hypoxia [facility])  Mason Patton is a 75 year old male with PMHx of combined systolic and diastolic HF, CAD s/p CABG, aortic stenosis s/p AVR, persistent atrial fibrillation on Eliquis, OSA on CPAP, type II DM with peripheral neuropathy, COPD and prior CVA admitted with staph epidermidis bacteremia.   Sepsis 2/2 Staph Epidermidis Bacteremia  Acute respiratory failure with hypoxia - resolved Tmax 97.6 over past 24 hours with last episode of fever on 5/6 with Tmax of 100.2. Noted to have methicillin resistant staph epidermidis bacteremia. This morning, patient is doing well over all.  He has remained afebrile for 24 hours and without leukocytosis on labs.  His respiratory failure has resolved at this time and he is only on CPAP nightly.  He is continued on IV vancomycin. Discussed getting TEE to evaluate for any valvular abnormalities and presence of vegetations.  Patient expresses  anxiety regarding this given that he had some trouble with extubation following his CABG.  Discussed that this is a routine procedure with minimal risks.  Patient is agreeable to this.  Discussed with the cardiologist on-call; TEE on either Monday vs Tuesday as schedule allows.  -Continue IV vancomycin -Continue to trend fever curve -TEE per cardiology schedule  Hypertension: Patient has become progressively hypotensive with systolics in the 160s to 180s in the past several days.  He is on Coreg 12.5 mg twice daily, losartan 50 mg daily and Lasix 40 mg daily for his heart failure.  Renal function is stable -At this time, will increase  his losartan to 100 mg daily -Continue Coreg 12.5 twice daily, Lasix 40 mg daily for his heart failure -If blood pressure is persistently elevated, will add amlodipine.   OSA on CPAP: Continue CPAP nightly  Postablative hypothyroidism:  Iatrogenic hyperthyroidism:  His levothyroxine was decreased to 200 mcg daily at the last admission.  He will need repeat labs in 1 month. 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  Patient received second dose of Feraheme during this admission.  Hemoglobin stable at 8.4 this morning. -Continue to trend CBC -Has bacteremia, can start iron replacement therapy following resolution   Chronic combined systolic and diastolic heart failure: Patient appears euvolemic on examination today.   -Continue Lasix 40 mg daily  Chronic persistent atrial fibrillation:  Patient is rate controlled on Coreg.  -Continue Coreg 12.5mg  twice daily -Continue Eliquis 5 mg twice daily for anticoagulation  Type 2 diabetes with peripheral neuropathy Hemoglobin A1c of 7.4 in March 2022.  Patient is on metformin 1000 mg twice daily at home.  He is on sliding scale insulin in the hospital.  BG's have remained 100-1 50s. - Continue SSI - Continue CBG monitoring   Diet: HH IVF: N/A DVT Prophylaxis: Eliquis  Code status: FULL  Prior to Admission Living Arrangement: Home Anticipated Discharge Location: Home w/HH Barriers to Discharge: Continued medical management  Dispo: Anticipated discharge in approximately 3-4 day(s).   Eliezer Bottom, MD  [facility] PGY-2 11/12/2020, 6:52 AM Pager: [phone] After 5pm on weekdays and 1pm on weekends: On Call pager [phone]

## 2020-11-12 NOTE — Progress Notes (Signed)
Placed patient on CPAP for the night with pressure set at 8cm and oxygen set at 2lpm.  

## 2020-11-12 NOTE — Progress Notes (Signed)
Physical Therapy Treatment Patient Details Name: Mason Patton MRN: [id number] DOB: [birth year] Today's Date: 11/12/2020    History of Present Illness 75 year old man presented 11/08/20 due to confusion and fall OOB. Had been discharged 11/07/20 after treated for pna vs COPD exacerbation. CT head was negative. CT chest 5/4: Small left pleural effusion  PMH- heart failure, CAD s/p CABG, aortic stenosis status post AVR, persistent atrial fibrillation (on Eliquis), OSA (on CPAP), T2DM, COPD, prior CVA    PT Comments    Pt with much improvement from a cognitive and functional standpoint today. Pt able to converse throughout session, stayed on task, and even noted his room number when he left the room to be able to find it on the way back. Pt did get from bed to chair on his own prior to therapist's entry with cath bag still attached to bed and linens on floor so still with some decreased safety awareness. He ambulated 200' with RW and min-guard A, able to converse while walking and sats 88-100% on RA. Still appropriate for HHPT. Acute PT will continue to follow.    Follow Up Recommendations  Home health PT;Supervision for mobility/OOB     Equipment Recommendations  None recommended by PT    Recommendations for Other Services       Precautions / Restrictions Precautions Precautions: Fall Restrictions Weight Bearing Restrictions: No    Mobility  Bed Mobility               General bed mobility comments: pt received in chair    Transfers Overall transfer level: Needs assistance Equipment used: Rolling walker (2 wheeled) Transfers: Sit to/from Stand Sit to Stand: Min guard         General transfer comment: pt stood from recliner to RW without physical assist, good use of arm rests. Increased time needed  Ambulation/Gait Ambulation/Gait assistance: Min guard Gait Distance (Feet): 200 Feet Assistive device: Rolling walker (2 wheeled) Gait Pattern/deviations: Step-through  pattern Gait velocity: Decreased Gait velocity interpretation: <1.8 ft/sec, indicate of risk for recurrent falls General Gait Details: pt ambulated in hallway with RW. SPO2 88-100% on RA. Pt able to speak while ambulating   Stairs             Wheelchair Mobility    Modified Rankin (Stroke Patients Only)       Balance Overall balance assessment: Needs assistance Sitting-balance support: No upper extremity supported;Feet supported Sitting balance-Leahy Scale: Good     Standing balance support: Bilateral upper extremity supported Standing balance-Leahy Scale: Poor Standing balance comment: able to maintain static stance without holding RW but needed UE support with dynamic                            Cognition Arousal/Alertness: Awake/alert Behavior During Therapy: WFL for tasks assessed/performed Overall Cognitive Status: No family/caregiver present to determine baseline cognitive functioning                     Current Attention Level: Alternating Memory: Decreased short-term memory Following Commands: Follows one step commands with increased time;Follows multi-step commands with increased time   Awareness: Anticipatory Problem Solving: Slow processing;Decreased initiation;Requires verbal cues General Comments: pt very engaged today and able to converse while walking and even took note of his room number as he left the room to find it when he came back. Improved from last session.      Exercises  General Comments        Pertinent Vitals/Pain Pain Assessment: No/denies pain    Home Living                      Prior Function            PT Goals (current goals can now be found in the care plan section) Acute Rehab PT Goals Patient Stated Goal: Return home PT Goal Formulation: With patient/family Time For Goal Achievement: 11/23/20 Potential to Achieve Goals: Good Progress towards PT goals: Progressing toward goals     Frequency    Min 3X/week      PT Plan Current plan remains appropriate    Co-evaluation              AM-PAC PT "6 Clicks" Mobility   Outcome Measure  Help needed turning from your back to your side while in a flat bed without using bedrails?: None Help needed moving from lying on your back to sitting on the side of a flat bed without using bedrails?: None Help needed moving to and from a bed to a chair (including a wheelchair)?: A Little Help needed standing up from a chair using your arms (e.g., wheelchair or bedside chair)?: A Little Help needed to walk in hospital room?: A Little Help needed climbing 3-5 steps with a railing? : A Lot 6 Click Score: 19    End of Session Equipment Utilized During Treatment: Gait belt Activity Tolerance: Patient tolerated treatment well Patient left: in chair;with call bell/phone within reach Nurse Communication: Mobility status PT Visit Diagnosis: Other abnormalities of gait and mobility (R26.89);Muscle weakness (generalized) (M62.81)     Time: 7915-0569 PT Time Calculation (min) (ACUTE ONLY): 30 min  Charges:  $Gait Training: 23-37 mins                     Lyanne Co, PT  Acute Rehab Services  Pager [phone] Office [phone]    Lawana Chambers Laurabeth Yip 11/12/2020, 11:39 AM

## 2020-11-13 DIAGNOSIS — R7881 Bacteremia: Secondary | ICD-10-CM

## 2020-11-13 DIAGNOSIS — E119 Type 2 diabetes mellitus without complications: Secondary | ICD-10-CM

## 2020-11-13 DIAGNOSIS — A419 Sepsis, unspecified organism: Secondary | ICD-10-CM | POA: Diagnosis not present

## 2020-11-13 DIAGNOSIS — D509 Iron deficiency anemia, unspecified: Secondary | ICD-10-CM | POA: Diagnosis not present

## 2020-11-13 DIAGNOSIS — I10 Essential (primary) hypertension: Secondary | ICD-10-CM | POA: Diagnosis not present

## 2020-11-13 DIAGNOSIS — B957 Other staphylococcus as the cause of diseases classified elsewhere: Secondary | ICD-10-CM

## 2020-11-13 LAB — BASIC METABOLIC PANEL
Anion gap: 8 (ref 5–15)
BUN: 7 mg/dL — ABNORMAL LOW (ref 8–23)
CO2: 27 mmol/L (ref 22–32)
Calcium: 8.9 mg/dL (ref 8.9–10.3)
Chloride: 105 mmol/L (ref 98–111)
Creatinine, Ser: 0.75 mg/dL (ref 0.61–1.24)
GFR, Estimated: >60 mL/min (ref >60)
Glucose, Bld: 105 mg/dL — ABNORMAL HIGH (ref 70–99)
Potassium: 4 mmol/L (ref 3.5–5.1)
Sodium: 140 mmol/L (ref 135–145)

## 2020-11-13 LAB — GLUCOSE, CAPILLARY
Glucose-Capillary: 113 mg/dL — ABNORMAL HIGH (ref 70–99)
Glucose-Capillary: 173 mg/dL — ABNORMAL HIGH (ref 70–99)
Glucose-Capillary: 122 mg/dL — ABNORMAL HIGH (ref 70–99)

## 2020-11-13 LAB — CULTURE, BLOOD (ROUTINE X 2)
Culture: NO GROWTH
Culture: NO GROWTH
Special Requests: ADEQUATE
Special Requests: ADEQUATE

## 2020-11-13 LAB — CBC
HCT: 30.3 % — ABNORMAL LOW (ref 39.0–52.0)
Hemoglobin: 8.9 g/dL — ABNORMAL LOW (ref 13.0–17.0)
MCH: 23.1 pg — ABNORMAL LOW (ref 26.0–34.0)
MCHC: 29.4 g/dL — ABNORMAL LOW (ref 30.0–36.0)
MCV: 78.5 fL — ABNORMAL LOW (ref 80.0–100.0)
Platelets: 220 10*3/uL (ref 150–400)
RBC: 3.86 MIL/uL — ABNORMAL LOW (ref 4.22–5.81)
RDW: 22.9 % — ABNORMAL HIGH (ref 11.5–15.5)
WBC: 7.7 10*3/uL (ref 4.0–10.5)
nRBC: 0 % (ref 0.0–0.2)

## 2020-11-13 MED ORDER — AMLODIPINE BESYLATE 5 MG PO TABS
5.0000 mg | ORAL_TABLET | Freq: Every day | ORAL | Status: DC
  Administered 2020-11-13 – 2020-11-14 (×2): 5 mg via ORAL
  Filled 2020-11-13 (×2): qty 1

## 2020-11-13 NOTE — Plan of Care (Signed)
  Problem: Clinical Measurements: Goal: Ability to maintain clinical measurements within normal limits will improve Outcome: Progressing Goal: Diagnostic test results will improve Outcome: Progressing Goal: Respiratory complications will improve Outcome: Progressing Goal: Cardiovascular complication will be avoided Outcome: Progressing   Problem: Nutrition: Goal: Adequate nutrition will be maintained Outcome: Progressing   Problem: Safety: Goal: Ability to remain free from injury will improve Outcome: Progressing   Problem: Skin Integrity: Goal: Risk for impaired skin integrity will decrease Outcome: Progressing   Problem: Education: Goal: Knowledge of General Education information will improve Description: Including pain rating scale, medication(s)/side effects and non-pharmacologic comfort measures Outcome: Progressing   Problem: Health Behavior/Discharge Planning: Goal: Ability to manage health-related needs will improve Outcome: Progressing   Problem: Clinical Measurements: Goal: Ability to maintain clinical measurements within normal limits will improve Outcome: Progressing Goal: Will remain free from infection Outcome: Progressing   Problem: Activity: Goal: Risk for activity intolerance will decrease Outcome: Progressing   Problem: Nutrition: Goal: Adequate nutrition will be maintained Outcome: Progressing   Problem: Coping: Goal: Level of anxiety will decrease Outcome: Progressing   Problem: Elimination: Goal: Will not experience complications related to bowel motility Outcome: Progressing Goal: Will not experience complications related to urinary retention Outcome: Progressing   Problem: Pain Managment: Goal: General experience of comfort will improve Outcome: Progressing   

## 2020-11-13 NOTE — Progress Notes (Signed)
Occupational Therapy Treatment Patient Details Name: Mason Patton MRN: [id number] DOB: 1945-11-22 Today's Date: 11/13/2020    History of present illness 75 year old man presented 11/08/20 due to confusion and fall OOB. Had been discharged 11/07/20 after treated for pna vs COPD exacerbation. CT head was negative. CT chest 5/4: Small left pleural effusion  PMH- heart failure, CAD s/p CABG, aortic stenosis status post AVR, persistent atrial fibrillation (on Eliquis), OSA (on CPAP), T2DM, COPD, prior CVA   OT comments  Pt making steady progress towards OT goals this session. Pt received seated in recliner agreeable to OT intervention. Pt continues to present with decreased activity tolerance, impaired balance, and generalized deconditioning. Pt currently requires min guard assist for functional mobility greater than a household distance with Rw and MIN A for UB ADLs. Pt would continue to benefit from skilled occupational therapy while admitted and after d/c to address the below listed limitations in order to improve overall functional mobility and facilitate independence with BADL participation. DC plan remains appropriate, will follow acutely per POC.     Follow Up Recommendations  Home health OT;Supervision/Assistance - 24 hour    Equipment Recommendations  None recommended by OT    Recommendations for Other Services      Precautions / Restrictions Precautions Precautions: Fall Restrictions Weight Bearing Restrictions: No       Mobility Bed Mobility               General bed mobility comments: pt OOB in recliner upon arrival and returned to recliner at end of session    Transfers Overall transfer level: Needs assistance Equipment used: Rolling walker (2 wheeled) Transfers: Sit to/from Stand Sit to Stand: Min guard         General transfer comment: minguard for safety from recliner with good hand placement noted    Balance Overall balance assessment: Needs  assistance Sitting-balance support: No upper extremity supported;Feet supported Sitting balance-Leahy Scale: Good     Standing balance support: Bilateral upper extremity supported Standing balance-Leahy Scale: Poor Standing balance comment: reliant on BUE support                           ADL either performed or assessed with clinical judgement   ADL Overall ADL's : Needs assistance/impaired                 Upper Body Dressing : Minimal assistance;Sitting Upper Body Dressing Details (indicate cue type and reason): to don back side gown     Toilet Transfer: Min IT sales professional Details (indicate cue type and reason): simulated via functional mobility with Rw         Functional mobility during ADLs: Min guard;Rolling walker General ADL Comments: pt continues to present with decreased activity tolerance, impaired balane and generalized deconditioning     Vision       Perception     Praxis      Cognition Arousal/Alertness: Awake/alert Behavior During Therapy: WFL for tasks assessed/performed Overall Cognitive Status: Within Functional Limits for tasks assessed                                 General Comments: overall WFL for mobility tasks, did not formally assess        Exercises     Shoulder Instructions       General Comments pt on RA during session SpO2  99% post ambulation HR in Afib pattern but no higher than 88 bpm    Pertinent Vitals/ Pain       Pain Assessment: Faces Faces Pain Scale: No hurt  Home Living                                          Prior Functioning/Environment              Frequency  Min 2X/week        Progress Toward Goals  OT Goals(current goals can now be found in the care plan section)  Progress towards OT goals: Progressing toward goals  Acute Rehab OT Goals Patient Stated Goal: to be able to eat OT Goal Formulation: With patient Time For Goal  Achievement: 11/23/20 Potential to Achieve Goals: Good  Plan Discharge plan remains appropriate;Frequency remains appropriate    Co-evaluation                 AM-PAC OT "6 Clicks" Daily Activity     Outcome Measure   Help from another person eating meals?: None Help from another person taking care of personal grooming?: A Little Help from another person toileting, which includes using toliet, bedpan, or urinal?: A Little Help from another person bathing (including washing, rinsing, drying)?: A Little Help from another person to put on and taking off regular upper body clothing?: None Help from another person to put on and taking off regular lower body clothing?: A Lot 6 Click Score: 19    End of Session Equipment Utilized During Treatment: Gait belt;Rolling walker  OT Visit Diagnosis: Unsteadiness on feet (R26.81);Other abnormalities of gait and mobility (R26.89);Muscle weakness (generalized) (M62.81);Other symptoms and signs involving cognitive function   Activity Tolerance Patient tolerated treatment well   Patient Left in chair;with call bell/phone within reach;with nursing/sitter in room   Nurse Communication Mobility status        Time: 5852-7782 OT Time Calculation (min): 14 min  Charges: OT General Charges $OT Visit: 1 Visit OT Treatments $Self Care/Home Management : 8-22 mins  Lenor Derrick., COTA/L Acute Rehabilitation Services [phone]    Barron Schmid 11/13/2020, 10:06 AM

## 2020-11-13 NOTE — Progress Notes (Signed)
Subjective:  [name]  No acute events overnight. Patient evaluated at bedside this AM, getting ready to brush his teeth. Says he's doing much better today. He denies any CP, SOB or any other symptoms. Discussed plan for TEE, continue IV antibiotics  Objective:  Vital signs in last 24 hours: Vitals:   11/12/20 0500 11/12/20 1549 11/12/20 1925 11/12/20 2013  BP:  126/70    Pulse:  (!) 57  79  Resp:  17 (!) 23 18  Temp:  (!) 97.4 F (36.3 C)    TempSrc:      SpO2:  98%  96%  Weight: 105.9 kg     Height:       Physical Exam: General: Elderly, sitting by sink in no acute distress CV: Regular rate, irregular rhythm. No m/r/g appreciated. Pulm: Normal work of breathing on room air. Clear to auscultation bilaterally. MSK: Normal bulk, tone. No pitting edema bilaterally. Skin: Warm, dry. No rashes appreciated.  CBC Latest Ref Rng & Units 11/13/2020 11/12/2020 11/11/2020  WBC 4.0 - 10.5 K/uL 7.7 8.2 7.7  Hemoglobin 13.0 - 17.0 g/dL 7.1(I) 4.5(Y) 7.9(L)  Hematocrit 39.0 - 52.0 % 30.3(L) 29.0(L) 26.1(L)  Platelets 150 - 400 K/uL 220 208 184   BMP Latest Ref Rng & Units 11/13/2020 11/12/2020 11/11/2020  Glucose 70 - 99 mg/dL 099(I) 338(S) 95  BUN 8 - 23 mg/dL 7(L) 9 8  Creatinine 5.05 - 1.24 mg/dL 3.97 6.73 4.19  BUN/Creat Ratio 10 - 24 - - -  Sodium 135 - 145 mmol/L 140 137 136  Potassium 3.5 - 5.1 mmol/L 4.0 3.8 3.3(L)  Chloride 98 - 111 mmol/L 105 103 100  CO2 22 - 32 mmol/L 27 24 27   Calcium 8.9 - 10.3 mg/dL 8.9 ) 3.7(T)   Assessment/Plan: Mason Patton is 74yo cism[place] (he/him) living with combined systolic and diastolic heart failure, CAD s/p CABG, aortic stenosis s/p AVR, persistent atrial fibrillation on Eliquis, OSA on CPAP, T2DM, COPD, prior CVA admitted 5/4, subsequently found to have staph epidermidis bacteremia, now improving on IV antibiotics.  Principal Problem:   Sepsis (HCC) Active Problems:   Hypertension   Fever   OSA on CPAP   Uncontrolled type 2 diabetes mellitus  with peripheral neuropathy (HCC)   Postablative hypothyroidism   Acute respiratory failure with hypoxia (HCC)  #Staph epidermidis bacteremia #Acute hypoxic respiratory failure, resolved Patient continues to be afebrile, last fever on 5/6. BCx from 5/5 growing methicillin resistant staph epidermidis bacteremia. Today is day three of vancomycin. Plan to repeat BCx today, expect source control given patient's clinical improvement over the last few days. TTE previously without vegetations, plan to obtain TEE for further evaluation. Unfortunately, due to scheduling issues TEE planned for Thursday unless there is an opening sooner. - C/w IV vancomycin (day 3) - F/u repeat BCx - TEE tentatively planned for 5/12  #Hypertension This AM BP elevated to systolic 180's. Denies chest pain or worsening respiratory status. Renal function and electrolytes stable. Will continue with home medications and add amlodipine today. - Add amlodipine 5mg  today - C/w carvedilol 12.5mg  BID, losartan 100mg  qd  #Iron deficiency anemia Hgb stable today, 8.9<8.4. No obvious signs of bleeding on exam. Received second dose Feraheme 5/5. Will hold off further iron supplementation given acute infection. - Daily CBC - Restart oral iron supplementation once resolved  #Chronic combined systolic, diastolic heart failure On exam this morning appears euvolemic, respiratory status markedly improved since admission. Will continue with home medications. - C/w home lasix 40mg   qd, carvedilol 12.5mg  BID  #Chronic persistent atrial fibrillation Continues to be rate-controlled on home medications. - C/w home carvedilol, Eliquis BID  #Type 2 DM A1c 7.4 in March. Glucose continues to be appropriate, will continue with sliding scale. - CBG monitoring - SSI  DIET: HH IVF: n/a DVT PPX: home Eliquis BOWEL: Miralax, Senokot-S CODE: FULL FAM COM: Wife present at bedside this AM  Prior to Admission Living Arrangement:  Home Anticipated Discharge Location: Home, pt and family refusing SNF Barriers to Discharge: medical management Dispo: Anticipated discharge in approximately 4-5 day(s).   Mason Kanner, MD 11/13/2020, 6:20 AM Pager: [phone] After 5pm on weekdays and 1pm on weekends: On Call pager [phone]

## 2020-11-13 NOTE — Progress Notes (Signed)
Patient wearing CPAP when RT entered room. Patient is tolerating CPAP well. No respiratory distress noted. VSS. RT will monitor as needed.

## 2020-11-14 DIAGNOSIS — A419 Sepsis, unspecified organism: Secondary | ICD-10-CM | POA: Diagnosis not present

## 2020-11-14 DIAGNOSIS — D509 Iron deficiency anemia, unspecified: Secondary | ICD-10-CM | POA: Diagnosis not present

## 2020-11-14 DIAGNOSIS — I10 Essential (primary) hypertension: Secondary | ICD-10-CM | POA: Diagnosis not present

## 2020-11-14 DIAGNOSIS — R7881 Bacteremia: Secondary | ICD-10-CM | POA: Diagnosis not present

## 2020-11-14 LAB — CBC
HCT: 30.5 % — ABNORMAL LOW (ref 39.0–52.0)
Hemoglobin: 9 g/dL — ABNORMAL LOW (ref 13.0–17.0)
MCH: 23.1 pg — ABNORMAL LOW (ref 26.0–34.0)
MCHC: 29.5 g/dL — ABNORMAL LOW (ref 30.0–36.0)
MCV: 78.4 fL — ABNORMAL LOW (ref 80.0–100.0)
Platelets: 242 10*3/uL (ref 150–400)
RBC: 3.89 MIL/uL — ABNORMAL LOW (ref 4.22–5.81)
RDW: 23.8 % — ABNORMAL HIGH (ref 11.5–15.5)
WBC: 7.2 10*3/uL (ref 4.0–10.5)
nRBC: 0 % (ref 0.0–0.2)

## 2020-11-14 LAB — BASIC METABOLIC PANEL
Anion gap: 9 (ref 5–15)
BUN: 8 mg/dL (ref 8–23)
CO2: 23 mmol/L (ref 22–32)
Calcium: 8.7 mg/dL — ABNORMAL LOW (ref 8.9–10.3)
Chloride: 106 mmol/L (ref 98–111)
Creatinine, Ser: 0.62 mg/dL (ref 0.61–1.24)
GFR, Estimated: >60 mL/min (ref >60)
Glucose, Bld: 97 mg/dL (ref 70–99)
Potassium: 3.5 mmol/L (ref 3.5–5.1)
Sodium: 138 mmol/L (ref 135–145)

## 2020-11-14 LAB — GLUCOSE, CAPILLARY
Glucose-Capillary: 105 mg/dL — ABNORMAL HIGH (ref 70–99)
Glucose-Capillary: 200 mg/dL — ABNORMAL HIGH (ref 70–99)
Glucose-Capillary: 171 mg/dL — ABNORMAL HIGH (ref 70–99)
Glucose-Capillary: 105 mg/dL — ABNORMAL HIGH (ref 70–99)

## 2020-11-14 LAB — VANCOMYCIN, TROUGH: Vancomycin Tr: 20 ug/mL (ref 15–20)

## 2020-11-14 LAB — VANCOMYCIN, PEAK: Vancomycin Pk: 39 ug/mL (ref 30–40)

## 2020-11-14 MED ORDER — VANCOMYCIN HCL 1750 MG/350ML IV SOLN
1750.0000 mg | INTRAVENOUS | Status: DC
  Administered 2020-11-14 – 2020-11-15 (×2): 1750 mg via INTRAVENOUS
  Filled 2020-11-14 (×3): qty 350

## 2020-11-14 NOTE — Progress Notes (Signed)
Subjective:  HD6  Patient evaluated at bedside this AM. Mason Patton is frustrated this morning. He feels that he is isolated to his bed despite desire to get up out of his bed. He is frustrated with being told to go back to bed when his bed alarms go off. He says his foley catheter had leaked overnight but his nurse didn't believe him. He is frustrated his TEE is delayed until Thursday. He wonders whether he can go home in the meantime until his TEE although we explained he currently requires IV antibiotics given his infection. He is excited by the possibility of getting unhooked from equipment to walk around a bit.    Objective:  Vital signs in last 24 hours: Vitals:   11/13/20 1603 11/13/20 1942 11/13/20 2233 11/14/20 0505  BP: 130/69 138/68  125/70  Pulse: 70 (!) 54 64 69  Resp: 20 18 18 18   Temp: 97.6 F (36.4 C) 98.3 F (36.8 C)  97.8 F (36.6 C)  TempSrc:  Axillary  Axillary  SpO2: 99% 100% 98% 100%  Weight:      Height:       Physical Exam: General: Elderly, sitting in up in bed, no acute distress CV: Regular rate, irregular rhythm. No m/r/g appreciated. Pulm: Normal work of breathing, clear to auscultation bilaterally. MSK: No pitting edema bilaterally. Neuro: Awake, alert, answering questions appropriately  CBC Latest Ref Rng & Units 11/14/2020 11/13/2020 11/12/2020  WBC 4.0 - 10.5 K/uL 7.2 7.7 8.2  Hemoglobin 13.0 - 17.0 g/dL 9.0(L) 8.9(L) 8.4(L)  Hematocrit 39.0 - 52.0 % 30.5(L) 30.3(L) 29.0(L)  Platelets 150 - 400 K/uL 242 220 208   BMP Latest Ref Rng & Units 11/14/2020 11/13/2020 11/12/2020  Glucose 70 - 99 mg/dL 97 01/12/2021) 102(V)  BUN 8 - 23 mg/dL 8 7(L) 9  Creatinine 253(G - 1.24 mg/dL 6.44 0.34 7.42  BUN/Creat Ratio 10 - 24 - - -  Sodium 135 - 145 mmol/L 138 140 137  Potassium 3.5 - 5.1 mmol/L 3.5 4.0 3.8  Chloride 98 - 111 mmol/L 106 105 103  CO2 22 - 32 mmol/L 23 27 24   Calcium 8.9 - 10.3 mg/dL 5.95) 8.9 )   Assessment/Plan: Mason Patton is 74yo  [place] (he/him) living with combined systolic and diastolic heart failure, CAD s/p CABG, aortic stenosis s/p AVR, persistent Afib on Eliquis, OSA on CPAP, T2DM, COPD, prior CVA admitted 5/4 and found to have staph epidermidis bacteremia, currently on IV vancomycin awaiting TEE.  Principal Problem:   Sepsis (HCC) Active Problems:   Hypertension   Fever   OSA on CPAP   Uncontrolled type 2 diabetes mellitus with peripheral neuropathy (HCC)   Postablative hypothyroidism   Acute respiratory failure with hypoxia (HCC)   Staphylococcus epidermidis bacteremia  #Staph epidermidis bacteremia #Acute hypoxic respiratory failure, resolved Patient continues to improve, afebrile and hemodynamically stable. BCx from 5/5 sensitive to vancomycin. Repeat BCx no growth. Will continue with IV vancomycin until TEE, now scheduled for Thursday morning. Mason Patton is understandably frustrated with scheduling, however he appears much improved since starting IV antibiotics. Since he is feeling much better, will d/c catheter, have bedside commode. Discussed with nursing, patient will still need assistance to get out of bed due to recent fall. - IV vancomycin (day 4) - Following r/p BCx - TEE scheduled Thursday 730AM, NPO tomorrow after midnight - D/c catheter, place bedside commode  #Chronic combined systolic and diastolic heart failure #Hypertension BP has improved, SBP in 130's. Currently not reporting  chest pain, edema, or dyspnea. No signs of hypervolemia on exam. Will continue with current medications, can increase amlodipine if BP not controlled.  - Carvedilol 12.5mg  BID - Lasix 40mg  qd - Losartan 100mg  qd - Amlodipine 5mg  qd  #Iron deficiency anemia Hgb 9.0<8.9, continues to be stable, no signs of acute bleeding. Planning on starting oral supplementation once he is out of acute infection. - Daily CBC - Restart oral iron supplementation at discharge  #Chronic persistent atrial  fibrillation Rate-controlled, will continue with home medications. - C/w home Coreg, Eliquis  #Type 2 DM A1c 7.4 09/2020. Glucose appropriate, will continue to monitor as he improves. - CBG monitoring - SSI  DIET: HH IVF: n/a DVT PPX: home Eliquis BOWEL: Miralax, Senokot-S CODE: FULL FAM COM: Wife at bedside this morning  Prior to Admission Living Arrangement: Home Anticipated Discharge Location: Home, family and patient refusing SNF Barriers to Discharge: medical management Dispo: Anticipated discharge in approximately 2-3 day(s).   , MD 11/14/2020, 7:25 AM Pager: [phone] After 5pm on weekdays and 1pm on weekends: On Call pager [phone]

## 2020-11-14 NOTE — Plan of Care (Signed)
  Problem: Clinical Measurements: Goal: Ability to maintain clinical measurements within normal limits will improve Outcome: Progressing Goal: Diagnostic test results will improve Outcome: Progressing Goal: Respiratory complications will improve Outcome: Progressing Goal: Cardiovascular complication will be avoided Outcome: Progressing   Problem: Nutrition: Goal: Adequate nutrition will be maintained Outcome: Progressing   Problem: Safety: Goal: Ability to remain free from injury will improve Outcome: Progressing   Problem: Skin Integrity: Goal: Risk for impaired skin integrity will decrease Outcome: Progressing   Problem: Education: Goal: Knowledge of General Education information will improve Description: Including pain rating scale, medication(s)/side effects and non-pharmacologic comfort measures Outcome: Progressing   Problem: Health Behavior/Discharge Planning: Goal: Ability to manage health-related needs will improve Outcome: Progressing   Problem: Clinical Measurements: Goal: Ability to maintain clinical measurements within normal limits will improve Outcome: Progressing Goal: Will remain free from infection Outcome: Progressing   Problem: Activity: Goal: Risk for activity intolerance will decrease Outcome: Progressing   Problem: Nutrition: Goal: Adequate nutrition will be maintained Outcome: Progressing   Problem: Coping: Goal: Level of anxiety will decrease Outcome: Progressing   Problem: Elimination: Goal: Will not experience complications related to bowel motility Outcome: Progressing Goal: Will not experience complications related to urinary retention Outcome: Progressing   Problem: Pain Managment: Goal: General experience of comfort will improve Outcome: Progressing   

## 2020-11-14 NOTE — Progress Notes (Signed)
Pharmacy Antibiotic Note  Mason Patton is a 75 y.o. male admitted on 11/08/2020 with SOB and fevers, blood cultures growing MR-Staph epidermidis  Pharmacy has been consulted for Vancomycin dosing.  Plan for TEE to eval for endocarditis. Vanc peak came back at 39 and trough 20 (a small volume was infused before stopping). We will adjust the dose for now and likely to check another level in a few days depending on LOT. Calculated AUC = 711 goal 400-600  Scr 0.62, wbc wnl Plan: Change vanc 1.75g IV q24 AUC 497 F/u TEE Levels as needed  Height: 5\' 10"  (177.8 cm) Weight: 105.9 kg (233 lb 7.5 oz) IBW/kg (Calculated) : 73  Temp (24hrs), Avg:97.9 F (36.6 C), Min:97.6 F (36.4 C), Max:98.3 F (36.8 C)  Recent Labs  Lab 11/08/20 0239 11/09/20 0500 11/10/20 0353 11/11/20 0102 11/12/20 0104 11/13/20 0427 11/14/20 0011 11/14/20 0937  WBC 9.7   < > 8.0 7.7 8.2 7.7 7.2  --   CREATININE 0.89   < > 0.92 0.74 0.79 0.75 0.62  --   LATICACIDVEN 1.7  --   --   --   --   --   --   --   VANCOTROUGH  --   --   --   --   --   --   --  20  VANCOPEAK  --   --   --   --   --   --  39  --    < > = values in this interval not displayed.    Estimated Creatinine Clearance: 98.8 mL/min (by C-G formula based on SCr of 0.62 mg/dL).    Allergies  Allergen Reactions  . Codeine Nausea And Vomiting  . Demerol Nausea And Vomiting   Azithromycin 5/1>> [5/5] Ceftriaxone 5/1>>5/2, 5/4 Unasyn 5/4>>5/5 Vanc 5/7>   Ucx 5/4: NG Bcx 5/4: NGF Bcx 5/5: staph epi 4/4 5/9 blood>>ngtd  7/9, PharmD, [place], AAHIVP, CPP Infectious Disease Pharmacist 11/14/2020 10:46 AM

## 2020-11-14 NOTE — Progress Notes (Signed)
Physical Therapy Treatment Patient Details Name: Mason Patton MRN: [id number] DOB: October 01, 1945 Today's Date: 11/14/2020    History of Present Illness 75 year old man presented 11/08/20 due to confusion and fall OOB. Had been discharged 11/07/20 after treated for pna vs COPD exacerbation. CT head was negative. CT chest 5/4: Small left pleural effusion  PMH- heart failure, CAD s/p CABG, aortic stenosis status post AVR, persistent atrial fibrillation (on Eliquis), OSA (on CPAP), T2DM, COPD, prior CVA    PT Comments    Pt making good progress toward return home with family. Continue to work toward incr activity tolerance.    Follow Up Recommendations  Home health PT;Supervision for mobility/OOB     Equipment Recommendations  None recommended by PT    Recommendations for Other Services       Precautions / Restrictions Precautions Precautions: Fall    Mobility  Bed Mobility Overal bed mobility: Modified Independent Bed Mobility: Supine to Sit     Supine to sit: Modified independent (Device/Increase time);HOB elevated     General bed mobility comments: Incr time and effort and HOB elevated    Transfers Overall transfer level: Needs assistance Equipment used: Rolling walker (2 wheeled) Transfers: Sit to/from Stand Sit to Stand: Min guard         General transfer comment: Assist for safety. Incr time to rise. Verbal cues for hand placement.  Ambulation/Gait Ambulation/Gait assistance: Supervision Gait Distance (Feet): 250 Feet Assistive device: Rolling walker (2 wheeled) Gait Pattern/deviations: Step-through pattern;Decreased stride length;Shuffle Gait velocity: decr Gait velocity interpretation: <1.31 ft/sec, indicative of household ambulator General Gait Details: Assist for safety. Decr foot clearance bilaterally   Stairs             Wheelchair Mobility    Modified Rankin (Stroke Patients Only)       Balance Overall balance assessment: Needs  assistance Sitting-balance support: No upper extremity supported;Feet supported Sitting balance-Leahy Scale: Good     Standing balance support: Bilateral upper extremity supported Standing balance-Leahy Scale: Poor Standing balance comment: walker and supervision for safety                            Cognition Arousal/Alertness: Awake/alert Behavior During Therapy: WFL for tasks assessed/performed Overall Cognitive Status: Within Functional Limits for tasks assessed                                        Exercises      General Comments General comments (skin integrity, edema, etc.): VSS      Pertinent Vitals/Pain Pain Assessment: No/denies pain    Home Living                      Prior Function            PT Goals (current goals can now be found in the care plan section) Progress towards PT goals: Progressing toward goals    Frequency    Min 3X/week      PT Plan Current plan remains appropriate    Co-evaluation              AM-PAC PT "6 Clicks" Mobility   Outcome Measure  Help needed turning from your back to your side while in a flat bed without using bedrails?: None Help needed moving from lying on your back to sitting on  the side of a flat bed without using bedrails?: None Help needed moving to and from a bed to a chair (including a wheelchair)?: A Little Help needed standing up from a chair using your arms (e.g., wheelchair or bedside chair)?: A Little Help needed to walk in hospital room?: A Little Help needed climbing 3-5 steps with a railing? : A Little 6 Click Score: 20    End of Session Equipment Utilized During Treatment: Gait belt Activity Tolerance: Patient tolerated treatment well Patient left: in chair;with call bell/phone within reach;with chair alarm set;with family/visitor present   PT Visit Diagnosis: Other abnormalities of gait and mobility (R26.89);Muscle weakness (generalized) (M62.81)      Time: 1321-1340 PT Time Calculation (min) (ACUTE ONLY): 19 min  Charges:  $Gait Training: 8-22 mins                     [facility] PT Acute Rehabilitation Services Pager [phone] Office [phone]    [name] [facility] 11/14/2020, 3:51 PM

## 2020-11-15 DIAGNOSIS — R7881 Bacteremia: Secondary | ICD-10-CM | POA: Diagnosis not present

## 2020-11-15 DIAGNOSIS — A419 Sepsis, unspecified organism: Secondary | ICD-10-CM | POA: Diagnosis not present

## 2020-11-15 DIAGNOSIS — D509 Iron deficiency anemia, unspecified: Secondary | ICD-10-CM | POA: Diagnosis not present

## 2020-11-15 DIAGNOSIS — I10 Essential (primary) hypertension: Secondary | ICD-10-CM | POA: Diagnosis not present

## 2020-11-15 LAB — BASIC METABOLIC PANEL WITH GFR
Anion gap: 9 (ref 5–15)
BUN: 8 mg/dL (ref 8–23)
CO2: 26 mmol/L (ref 22–32)
Calcium: 9.1 mg/dL (ref 8.9–10.3)
Chloride: 104 mmol/L (ref 98–111)
Creatinine, Ser: 0.78 mg/dL (ref 0.61–1.24)
GFR, Estimated: >60 mL/min
Glucose, Bld: 105 mg/dL — ABNORMAL HIGH (ref 70–99)
Potassium: 3.5 mmol/L (ref 3.5–5.1)
Sodium: 139 mmol/L (ref 135–145)

## 2020-11-15 LAB — CBC
HCT: 31.4 % — ABNORMAL LOW (ref 39.0–52.0)
Hemoglobin: 9 g/dL — ABNORMAL LOW (ref 13.0–17.0)
MCH: 22.6 pg — ABNORMAL LOW (ref 26.0–34.0)
MCHC: 28.7 g/dL — ABNORMAL LOW (ref 30.0–36.0)
MCV: 78.9 fL — ABNORMAL LOW (ref 80.0–100.0)
Platelets: 225 K/uL (ref 150–400)
RBC: 3.98 MIL/uL — ABNORMAL LOW (ref 4.22–5.81)
RDW: 24.1 % — ABNORMAL HIGH (ref 11.5–15.5)
WBC: 6.2 K/uL (ref 4.0–10.5)
nRBC: 0 % (ref 0.0–0.2)

## 2020-11-15 LAB — GLUCOSE, CAPILLARY
Glucose-Capillary: 116 mg/dL — ABNORMAL HIGH (ref 70–99)
Glucose-Capillary: 193 mg/dL — ABNORMAL HIGH (ref 70–99)
Glucose-Capillary: 187 mg/dL — ABNORMAL HIGH (ref 70–99)
Glucose-Capillary: 157 mg/dL — ABNORMAL HIGH (ref 70–99)

## 2020-11-15 MED ORDER — SODIUM CHLORIDE 0.9 % IV SOLN: INTRAVENOUS | Status: DC

## 2020-11-15 MED ORDER — AMLODIPINE BESYLATE 10 MG PO TABS
10.0000 mg | ORAL_TABLET | Freq: Every day | ORAL | Status: DC
  Administered 2020-11-15 – 2020-11-16 (×2): 10 mg via ORAL
  Filled 2020-11-15 (×2): qty 1

## 2020-11-15 NOTE — Progress Notes (Signed)
Occupational Therapy Treatment Patient Details Name: Mason Patton MRN: [id number] DOB: 08-Mar-1946 Today's Date: 11/15/2020    History of present illness 75 year old man presented 11/08/20 due to confusion and fall OOB. Had been discharged 11/07/20 after treated for pna vs COPD exacerbation. CT head was negative. CT chest 5/4: Small left pleural effusion  PMH- heart failure, CAD s/p CABG, aortic stenosis status post AVR, persistent atrial fibrillation (on Eliquis), OSA (on CPAP), T2DM, COPD, prior CVA   OT comments  Pt making steady progress towards OT goals this session.Pt received seated in recliner, dressed and agreeable to OT session. Pt needed less assist for functional mobility greater than a household distance this session with RW. Pt presents with improved balance standing unsupported to complete LB dressing tasks. Pt on RA during session with SpO2 99-100% post mobility and HR 88 bpm. .Pt would continue to benefit from skilled occupational therapy while admitted and after d/c to address the below listed limitations in order to improve overall functional mobility and facilitate independence with BADL participation. DC plan remains appropriate, will follow acutely per POC.     Follow Up Recommendations  Home health OT;Supervision/Assistance - 24 hour    Equipment Recommendations  None recommended by OT    Recommendations for Other Services      Precautions / Restrictions Precautions Precautions: Fall Restrictions Weight Bearing Restrictions: No       Mobility Bed Mobility               General bed mobility comments: pt OOB in recliner at start of session and returned pt to recliner at end of session    Transfers Overall transfer level: Needs assistance Equipment used: Rolling walker (2 wheeled) Transfers: Sit to/from Stand Sit to Stand: Min guard         General transfer comment: minguard for safety, cues for hand placement during stand and when descending into  recliner    Balance Overall balance assessment: Needs assistance Sitting-balance support: No upper extremity supported;Feet supported Sitting balance-Leahy Scale: Good Sitting balance - Comments: sitting on edge of recliner unsupported   Standing balance support: No upper extremity supported;During functional activity Standing balance-Leahy Scale: Fair Standing balance comment: standing with no UE support to pull pants to waist line                           ADL either performed or assessed with clinical judgement   ADL Overall ADL's : Needs assistance/impaired                   Upper Body Dressing Details (indicate cue type and reason): pt dressed upon arrival in [facility] shirt and reports getting dressed independently   Lower Body Dressing Details (indicate cue type and reason): pt dressed upon arrival and reports getting dressing independently; pt standing unsupported to pull pants up to waist line with no LOB, pt also with shoes donned Toilet Transfer: Supervision/safety;RW;Ambulation Statistician Details (indicate cue type and reason): simulated via functional mobility with Rw       Tub/Shower Transfer Details (indicate cue type and reason): pt reports walkin shower with seat Functional mobility during ADLs: Supervision/safety;Rolling walker       Vision       Perception     Praxis      Cognition Arousal/Alertness: Awake/alert Behavior During Therapy: WFL for tasks assessed/performed Overall Cognitive Status: Within Functional Limits for tasks assessed  General Comments: pleasant and making appropriate jokes throughout session        Exercises     Shoulder Instructions       General Comments pt on RA during session with SpO2 99-100% post mobility, HR 88 bpm    Pertinent Vitals/ Pain       Pain Assessment: No/denies pain  Home Living                                           Prior Functioning/Environment              Frequency  Min 2X/week        Progress Toward Goals  OT Goals(current goals can now be found in the care plan section)  Progress towards OT goals: Progressing toward goals  Acute Rehab OT Goals Patient Stated Goal: to go home tomorrow OT Goal Formulation: With patient Time For Goal Achievement: 11/23/20 Potential to Achieve Goals: Good  Plan Discharge plan remains appropriate;Frequency remains appropriate    Co-evaluation                 AM-PAC OT "6 Clicks" Daily Activity     Outcome Measure   Help from another person eating meals?: None Help from another person taking care of personal grooming?: None Help from another person toileting, which includes using toliet, bedpan, or urinal?: A Little Help from another person bathing (including washing, rinsing, drying)?: A Little Help from another person to put on and taking off regular upper body clothing?: None Help from another person to put on and taking off regular lower body clothing?: None 6 Click Score: 22    End of Session Equipment Utilized During Treatment: Rolling walker  OT Visit Diagnosis: Unsteadiness on feet (R26.81);Other abnormalities of gait and mobility (R26.89);Muscle weakness (generalized) (M62.81);Other symptoms and signs involving cognitive function   Activity Tolerance Patient tolerated treatment well   Patient Left in chair;with call bell/phone within reach   Nurse Communication Mobility status        Time: 2585-2778 OT Time Calculation (min): 9 min  Charges: OT General Charges $OT Visit: 1 Visit OT Treatments $Self Care/Home Management : 8-22 mins  Lenor Derrick., COTA/L Acute Rehabilitation Services [phone]   Barron Schmid 11/15/2020, 8:56 AM

## 2020-11-15 NOTE — Progress Notes (Addendum)
Subjective:  HD7  Patient evaluated at bedside this AM. Mason Patton says that he is doing much better today. He remains eager to go home. He denies any troubles breathing or CP. He wonders what will happen if he is found to have an infection of his heart valve. We explained he would need a PICC line for antibiotics and he says his wife has helped him with this at home before.   Objective:  Vital signs in last 24 hours: Vitals:   11/14/20 1700 11/14/20 2159 11/14/20 2306 11/15/20 0534  BP: 130/69 (!) 142/89  (!) 166/83  Pulse:  86  65  Resp: 20 20  19   Temp: 98.7 F (37.1 C) 97.6 F (36.4 C)  97.6 F (36.4 C)  TempSrc: Oral Axillary  Oral  SpO2: 100% 96% 99% 100%  Weight:    103 kg  Height:       Physical Exam: General: Sitting up in chair, no acute distress CV: Regular rate, irregular rhythm. No m/r/g. Pulm: Normal work of breathing, clear to auscultation bilaterally MSK: No pitting edema bilaterally. Psych: Normal mood, affect, speech.  CBC Latest Ref Rng & Units 11/15/2020 11/14/2020 11/13/2020  WBC 4.0 - 10.5 K/uL 6.2 7.2 7.7  Hemoglobin 13.0 - 17.0 g/dL 9.0(L) 9.0(L) 8.9(L)  Hematocrit 39.0 - 52.0 % 31.4(L) 30.5(L) 30.3(L)  Platelets 150 - 400 K/uL 225 242 220   BMP Latest Ref Rng & Units 11/15/2020 11/14/2020 11/13/2020  Glucose 70 - 99 mg/dL 01/13/2021) 97 696(E)  BUN 8 - 23 mg/dL 8 8 7(L)  Creatinine 952(W - 1.24 mg/dL 4.13 2.44 0.10  BUN/Creat Ratio 10 - 24 - - -  Sodium 135 - 145 mmol/L 139 138 140  Potassium 3.5 - 5.1 mmol/L 3.5 3.5 4.0  Chloride 98 - 111 mmol/L 104 106 105  CO2 22 - 32 mmol/L 26 23 27   Calcium 8.9 - 10.3 mg/dL 9.1 2.72) 8.9   Assessment/Plan: Mason Patton is 75yo cism[place] (he/him) living with combined systolic and diastolic heart failure, CAD s/p CABG, aortic stenosis s/p AVR, persistent Afib on Eliquis, OSA on CPAP, T2DM, COPD, prior CVA admitted 5/4 and found to have staph epidermidis bacteremia, continuing to get IV antibiotics awaiting TEE  tomorrow.  Principal Problem:   Sepsis (HCC) Active Problems:   Hypertension   Fever   OSA on CPAP   Uncontrolled type 2 diabetes mellitus with peripheral neuropathy (HCC)   Postablative hypothyroidism   Acute respiratory failure with hypoxia (HCC)   Staphylococcus epidermidis bacteremia  #Staph epidermidis bacteremia #Hx aortic stenosis s/p AVR Patient doing very well this morning, laughing and making jokes with staff on rounds. Repeat BCx continue to be negative. Plan for TEE tomorrow morning. If no vegetations will switch to oral antibiotics and discharge. If there are vegetations will need to obtain PICC line for long-term antibiotics and consult cardiothoracic surgery to evaluate bioprosthetic aortic valve.  - IV vancomycin (day 5) - TEE tomorrow AM - NPO @ MN  #Chronic combined systolic and diastolic heart failure #Hypertension SBP more elevated over the 24-48 hours above goal. No chest pain, edema, or dyspnea present. Appears euvolemic on exam. Will increase amlodipine today. - Increase amlodipine 10mg  qd - Carvedilol 12.5mg  BID - Lasix 40mg  qd - Losartan 100mg  qd  #Iron deficiency anemia Hgb stable, 9.0<9.0, no bleeding on exam. Will re-start oral supplementation upon discharge. - Daily CBC - Restart oral iron supplementation at discharge  #Chronic persistent atrial fibrillation Rate-controlled, will continue with home medications -  Home carvedilol, apixaban  #Type 2 DM A1c 7.4 09/2020. Glucose remains appropriate. - CBG monitoring - SSI  DIET: HH IVF: n/a DVT PPX: home Eliquis BOWEL: Miralax, Senokot-S CODE: FULL FAM COM: Discussed with wife at bedside yesterday  Prior to Admission Living Arrangement: Home Anticipated Discharge Location: Home, family/patient refusing SNF Barriers to Discharge: medical management Dispo: Anticipated discharge in approximately 1 day(s).   Mason Kanner, MD 11/15/2020, 6:29 AM Pager: [phone] After 5pm on weekdays  and 1pm on weekends: On Call pager [phone]

## 2020-11-15 NOTE — Progress Notes (Signed)
    [facility] has been requested to perform a transesophageal echocardiogram on 11/16/2020 for bacteremia.  After careful review of history and examination, the risks and benefits of transesophageal echocardiogram have been explained including risks of esophageal damage, perforation (1:10,000 risk), bleeding, pharyngeal hematoma as well as other potential complications associated with conscious sedation including aspiration, arrhythmia, respiratory failure and death. Alternatives to treatment were discussed, questions were answered. Patient is willing to proceed.   Mr. Hogen is a 75 yo male with PMH of recent CABG and AVR presented with bacteremia. Plan to proceed with TEE.  Azalee Course, PA-C 11/15/2020 3:28 PM

## 2020-11-15 NOTE — TOC Progression Note (Signed)
Transition of Care [facility]) - Progression Note    Patient Details  Name: Mason Patton MRN: [id number] Date of Birth: [birth year]  [facility] CM/SW Contact  Lorri Frederick, LCSW Phone Number: 11/15/2020, 8:42 AM  Clinical Narrative: CSW continues to follow for DC needs.      Expected Discharge Plan: Home w Home Health Services Barriers to Discharge: Continued Medical Work up  Expected Discharge Plan and Services Expected Discharge Plan: Home w Home Health Services     Post Acute Care Choice: Home Health Living arrangements for the past 2 months: Single Family Home                                       Social Determinants of Health (SDOH) Interventions    Readmission Risk Interventions Readmission Risk Prevention Plan 11/10/2020 11/09/2020 11/06/2020  Transportation Screening - Complete Complete  PCP or Specialist Appt within 3-5 Days Complete - Complete  HRI or Home Care Consult - Complete Complete  Social Work Consult for Recovery Care Planning/Counseling - Complete Complete  Palliative Care Screening - Not Applicable Not Applicable  Medication Review Oceanographer) - - Complete  Some recent data might be hidden

## 2020-11-16 ENCOUNTER — Inpatient Hospital Stay (HOSPITAL_COMMUNITY): Payer: No Typology Code available for payment source | Admitting: Anesthesiology

## 2020-11-16 ENCOUNTER — Inpatient Hospital Stay (HOSPITAL_COMMUNITY): Payer: No Typology Code available for payment source

## 2020-11-16 ENCOUNTER — Encounter (HOSPITAL_COMMUNITY)
Admission: EM | Disposition: A | Discharge status: Home Health | Payer: Self-pay | Source: Home / Self Care | Attending: Internal Medicine

## 2020-11-16 DIAGNOSIS — J9601 Acute respiratory failure with hypoxia: Secondary | ICD-10-CM | POA: Diagnosis not present

## 2020-11-16 DIAGNOSIS — I340 Nonrheumatic mitral (valve) insufficiency: Secondary | ICD-10-CM

## 2020-11-16 DIAGNOSIS — E1142 Type 2 diabetes mellitus with diabetic polyneuropathy: Secondary | ICD-10-CM | POA: Diagnosis not present

## 2020-11-16 DIAGNOSIS — R7881 Bacteremia: Secondary | ICD-10-CM | POA: Diagnosis not present

## 2020-11-16 DIAGNOSIS — A419 Sepsis, unspecified organism: Secondary | ICD-10-CM | POA: Diagnosis not present

## 2020-11-16 HISTORY — PX: TEE WITHOUT CARDIOVERSION: SHX5443

## 2020-11-16 LAB — CBC
HCT: 32.3 % — ABNORMAL LOW (ref 39.0–52.0)
Hemoglobin: 9.3 g/dL — ABNORMAL LOW (ref 13.0–17.0)
MCH: 22.9 pg — ABNORMAL LOW (ref 26.0–34.0)
MCHC: 28.8 g/dL — ABNORMAL LOW (ref 30.0–36.0)
MCV: 79.6 fL — ABNORMAL LOW (ref 80.0–100.0)
Platelets: 252 10*3/uL (ref 150–400)
RBC: 4.06 MIL/uL — ABNORMAL LOW (ref 4.22–5.81)
RDW: 24.8 % — ABNORMAL HIGH (ref 11.5–15.5)
WBC: 7.5 10*3/uL (ref 4.0–10.5)
nRBC: 0 % (ref 0.0–0.2)

## 2020-11-16 LAB — BASIC METABOLIC PANEL
Anion gap: 10 (ref 5–15)
BUN: 11 mg/dL (ref 8–23)
CO2: 25 mmol/L (ref 22–32)
Calcium: 9.2 mg/dL (ref 8.9–10.3)
Chloride: 104 mmol/L (ref 98–111)
Creatinine, Ser: 0.8 mg/dL (ref 0.61–1.24)
GFR, Estimated: >60 mL/min (ref >60)
Glucose, Bld: 109 mg/dL — ABNORMAL HIGH (ref 70–99)
Potassium: 3.2 mmol/L — ABNORMAL LOW (ref 3.5–5.1)
Sodium: 139 mmol/L (ref 135–145)

## 2020-11-16 LAB — GLUCOSE, CAPILLARY
Glucose-Capillary: 102 mg/dL — ABNORMAL HIGH (ref 70–99)
Glucose-Capillary: 124 mg/dL — ABNORMAL HIGH (ref 70–99)

## 2020-11-16 SURGERY — ECHOCARDIOGRAM, TRANSESOPHAGEAL
Anesthesia: Monitor Anesthesia Care

## 2020-11-16 MED ORDER — SODIUM CHLORIDE 0.9 % IV SOLN: INTRAVENOUS | Status: DC | PRN

## 2020-11-16 MED ORDER — POTASSIUM CHLORIDE CRYS ER 20 MEQ PO TBCR
40.0000 meq | EXTENDED_RELEASE_TABLET | Freq: Two times a day (BID) | ORAL | Status: DC
  Administered 2020-11-16: 40 meq via ORAL
  Filled 2020-11-16: qty 2

## 2020-11-16 MED ORDER — PROPOFOL 500 MG/50ML IV EMUL
INTRAVENOUS | Status: DC | PRN
  Administered 2020-11-16: 100 ug/kg/min via INTRAVENOUS

## 2020-11-16 MED ORDER — AMLODIPINE BESYLATE 10 MG PO TABS: 10.0000 mg | ORAL_TABLET | Freq: Every day | ORAL | 0 refills | Status: DC

## 2020-11-16 MED ORDER — PROPOFOL 10 MG/ML IV BOLUS
INTRAVENOUS | Status: DC | PRN
  Administered 2020-11-16: 40 mg via INTRAVENOUS

## 2020-11-16 MED ORDER — DOXYCYCLINE HYCLATE 50 MG PO CAPS: 100.0000 mg | ORAL_CAPSULE | Freq: Two times a day (BID) | ORAL | 0 refills | Status: AC

## 2020-11-16 MED ORDER — LOSARTAN POTASSIUM 50 MG PO TABS: 100.0000 mg | ORAL_TABLET | Freq: Every day | ORAL | 0 refills | Status: DC

## 2020-11-16 NOTE — Progress Notes (Signed)
Patient back to his room at this time. VS checked and within normal limit. NAD noted.

## 2020-11-16 NOTE — Progress Notes (Signed)
PT Cancellation Note  Patient Details Name: Mason Patton MRN: [id number] DOB: Jul 29, 1945   Cancelled Treatment:    Reason Eval/Treat Not Completed: Other (comment)  Patient just back to bed and put CPAP on to try to sleep. States he didn't sleep well at all last night and just didn't want to get up OOB right now. Will see later today as schedule permits   Jerolyn Center, PT Pager [phone]   Zena Amos 11/16/2020, 9:43 AM

## 2020-11-16 NOTE — Discharge Summary (Signed)
Name: MERRELL BORSUK MRN: [id number] DOB: June 10, 1946 75 y.o. PCP: Merwyn Katos  Date of Admission: 11/08/2020  1:58 AM Date of Discharge: 11/16/2020 Attending Physician: Sid Falcon, MD  Discharge Diagnosis: 1. Staph epidermidis bacteremia 2. Hypertension 3. Chronic combined systolic and diastolic heart failure 4. Iron deficiency anemia 5. Chronic persistent atrial fibrillation 6. Type 2 diabetes melltius  Discharge Medications: Allergies as of 11/16/2020      Reactions   Codeine Nausea And Vomiting   Demerol Nausea And Vomiting      Medication List    STOP taking these medications   azithromycin 500 MG tablet Commonly known as: Zithromax   potassium chloride SA 20 MEQ tablet Commonly known as: KLOR-CON     TAKE these medications   acetaminophen 325 MG tablet Commonly known as: TYLENOL Take 2 tablets (650 mg total) by mouth every 4 (four) hours as needed for headache or mild pain. What changed: how much to take   amLODipine 10 MG tablet Commonly known as: NORVASC Take 1 tablet (10 mg total) by mouth daily. Start taking on: Nov 17, 2020   apixaban 5 MG Tabs tablet Commonly known as: ELIQUIS Take 5 mg by mouth 2 (two) times daily.   aspirin 81 MG EC tablet Take 1 tablet (81 mg total) by mouth daily. Swallow whole.   atorvastatin 40 MG tablet Commonly known as: LIPITOR Take 40 mg by mouth at bedtime. Reported on 01/11/2016   benzonatate 100 MG capsule Commonly known as: Tessalon Perles Take 1 capsule (100 mg total) by mouth 3 (three) times daily as needed for cough. What changed: Another medication with the same name was removed. Continue taking this medication, and follow the directions you see here.   carvedilol 12.5 MG tablet Commonly known as: COREG Take 1 tablet (12.5 mg total) by mouth 2 (two) times daily with a meal.   Centrum Silver 50+Men Tabs Take 1 tablet by mouth daily with breakfast. What changed: Another medication with  the same name was removed. Continue taking this medication, and follow the directions you see here.   cholecalciferol 25 MCG (1000 UNIT) tablet Commonly known as: VITAMIN D3 Take 1,000 Units by mouth daily.   doxycycline 50 MG capsule Commonly known as: VIBRAMYCIN Take 2 capsules (100 mg total) by mouth 2 (two) times daily for 8 days. Start taking on: Nov 17, 2020   ferrous sulfate 325 (65 FE) MG tablet Take 325 mg by mouth 3 (three) times a week.   furosemide 20 MG tablet Commonly known as: LASIX Take 3 tablets (60 mg total) by mouth in the AM, then take 2 tabs (40 mg total) by mouth in the PM for 3 days only, then go back to taking 2 tabs (40 mg total) by mouth daily thereafter. What changed:   how much to take  how to take this  when to take this  additional instructions   glipiZIDE 10 MG tablet Commonly known as: GLUCOTROL Take 10 mg by mouth 2 (two) times daily before a meal.   glucose blood test strip Commonly known as: OneTouch Verio Use to test blood sugar 2-3 times daily as instructed. Dx: E11.9   HYDROcodone-acetaminophen 5-325 MG tablet Commonly known as: NORCO/VICODIN Take 1 tablet by mouth every 6 (six) hours as needed (for pain).   levothyroxine 200 MCG tablet Commonly known as: Synthroid Take 1 tablet (200 mcg total) by mouth daily.   losartan 50 MG tablet Commonly known as: COZAAR Take 2  tablets (100 mg total) by mouth daily. Start taking on: Nov 17, 2020 What changed: how much to take   metFORMIN 500 MG tablet Commonly known as: GLUCOPHAGE Take 1,000 mg by mouth 2 (two) times daily with a meal.   nitroGLYCERIN 0.4 MG SL tablet Commonly known as: NITROSTAT Place 0.4 mg under the tongue every 5 (five) minutes as needed.   omeprazole 20 MG capsule Commonly known as: PRILOSEC Take 20 mg by mouth daily before breakfast.   OneTouch Delica Lancets Fine Misc Use to test blood sugar 2-3 times daily as instructed. Dx: E11.9   OneTouch Verio Flex  System w/Device Kit 1 each by Does not apply route daily. Dx: E11.9   Ventolin HFA 108 (90 Base) MCG/ACT inhaler Generic drug: albuterol Inhale 1-2 puffs into the lungs every 4 (four) hours as needed for wheezing or shortness of breath.   Vitamin B12 500 MCG Tabs Take 500 mcg by mouth 2 (two) times daily.      Disposition and follow-up:   Mr.Thao R Croft was discharged from [facility] in Stable condition.  At the hospital follow up visit please address:   Staph epidermidis bacteremia: Patient to complete course of doxycycline, last day 11/24/2020. TEE without signs of vegetations on bioprosthetic valve.    Hypertension: SBP elevated throughout admission. Titrated up losartan to 131m and added amlodipine 196m Please ensure patient taking correct medications, titrate as needed.   Iron deficiency anemia: Received two doses of Feraheme between his two admissions. Can re-start oral iron supplementation once acute illness has resolved.   Type 2 DM: Can consider SGLT2i if A1c remains above goal.    Labs / imaging needed at time of follow-up: CBC, BMP   Pending labs/ test needing follow-up: n/a  Follow-up Appointments:  Follow-up Information    WiHeywood BenePA-C. Go on 11/20/2020.   Specialty: Physician Assistant Why: Please attend your hospital discharge appointment with BrClyde Lundborgn Monday, 11/20/20 at 1:00pm. Contact information: [address] [place] [id number] problem list: 1. Staph epidermidis bacteremia: Patient is [age]yoith history of chronic systolic heart failure, aortic stenosis s/p AVR, COPD with recent admission for mild heart failure exacerbation vs COPD exacerbation who returned to ED day after discharge for worsening dyspnea and altered mental status. On exam the day of discharge, patient was sating well on room air, afebrile, and feeling back to baseline. He was  discharged with instructions to complete two more days of azithromycin. However, on return home patient reportedly became increasingly dyspneic and confused. BCx drawn on 4/30 and 5/1 were negative. On arrival to ED patient found to have fever of 102.38F, tachypneic to 24, sating well on room air. Lab work revealed no leukocytosis, no urinary infection. Initial CXR stable from previous, showing cardiomegaly and mild bibasilar atelectasis. CT head revealed no acute findings. Patient had another blood cultrue drawn and was started on ceftriaxone. On our initial exam, patient was found to not have GI symptoms, no open wounds, no focal neurological symptoms, or nuchal rigidity. CT chest and neck were obtained to ensure no underlying abscess after recent dental procedure. CT without signs of abscess or PE. MRI brain obtained, no acute stroke. TTE without vegetations or acute changes. Given he was very lethargic, concern for possible aspiration. He was continued on original azithromycin from previous admission and started on Unasyn. SLP eval revealed  mild aspiration risk. He was also given his second dose of Feraheme, with concern dyspnea possibly also related to anemia. Patient did have further low-grade fevers on day 3, so the decision was made to stop all antibiotics and antipyretics to see if his infection revealed itself. BCx drawn on 5/5 grew staph epidermidis overnight 5/6. Vancomycin was started and cardiology consulted for TEE. Unfortunately, patient had to wait until 5/12 for TEE. By this date, patient had markedly improved and was awake, alert, and walking around the room on room air. TEE revealed no acute changes and no evidence of vegetations. Patient was subsequently discharged 5/12 with instructions to complete 14d course of antibiotics with doxycycline.    2. Hypertension: Patient's home antihypertensives were initially held in setting of acute infection. Once patient became stable, was slowly able to add  back his home medications. However, his blood pressures continued to be elevated above goal. Therefore had to titrate losartan to 142m daily as well as add amlodipine 139mdaily. Patient discharged with instructions to continue these medications until he can follow-up with PCP for further titration as needed.  3. Chronic combined systolic and diastolic heart failure: Throughout patient's admission patient did not appear hypervolemic. Patient was admitted previously for mild heart failure and was adequately diuresed. Patient was able to return to his home lasix dose.  4. Iron deficiency anemia: Patient had received one dose of Feraheme during previous admission. On this admission, patient's Hgb had remained stable. Given he was dyspneic in the first few days of this admission, concern that anemia was contributing to his symptoms. He was subsequently given another dose of Feraheme with adequate response afterward. Further iron supplementation was held at discharge. Patient to re-start iron supplementation at end of antibiotic course per PCP.  5. Chronic persistent atrial fibrillation: Throughout admission patient remained rate-controlled with home carvedilol. Patient's home Eliquis was continued throughout as well for stroke prevention.  6. Type 2 diabetes melltius: Last A1c 7.4% in March 2022. Throughout admission glucose remained appropriate on sliding scale. At discharge patient was to resume home medications.  Discharge Exam:   BP 139/69   Pulse (!) 57   Temp 97.9 F (36.6 C) (Oral)   Resp (!) 22   Ht '5\' 10"'  (1.778 m)   Wt 102.6 kg   SpO2 100%   BMI 32.46 kg/m  General: Sitting on side of bed, no acute distress CV: Regular rate, irregular rhythm. No m/r/g appreciated. Distal pulses 2+ bilaterally.  Pulm: Clear to auscultation bilaterally. Normal work of breathing. Abdomen: Soft, non-tender, non-distended. Normoactive bowel sounds. MSK: No pitting edema bilaterally. Neuro: Awake, alert,  responding to questions appropriately.  Pertinent Labs, Studies, and Procedures:  CBC Latest Ref Rng & Units 11/16/2020 11/15/2020 11/14/2020  WBC 4.0 - 10.5 K/uL 7.5 6.2 7.2  Hemoglobin 13.0 - 17.0 g/dL 9.3(L) 9.0(L) 9.0(L)  Hematocrit 39.0 - 52.0 % 32.3(L) 31.4(L) 30.5(L)  Platelets 150 - 400 K/uL 252 225 242   BMP Latest Ref Rng & Units 11/16/2020 11/15/2020 11/14/2020  Glucose 70 - 99 mg/dL 109(H) 105(H) 97  BUN 8 - 23 mg/dL '11 8 8  ' Creatinine 0.61 - 1.24 mg/dL 0.80 0.78 0.62  BUN/Creat Ratio 10 - 24 - - -  Sodium 135 - 145 mmol/L 139 139 138  Potassium 3.5 - 5.1 mmol/L 3.2(L) 3.5 3.5  Chloride 98 - 111 mmol/L 104 104 106  CO2 22 - 32 mmol/L '25 26 23  ' Calcium 8.9 - 10.3 mg/dL 9.2 9.1 8.7(L)  Urinalysis    Component Value Date/Time   COLORURINE YELLOW 11/10/2020 [place] 11/10/2020 1514   LABSPEC 1.010 11/10/2020 1514   PHURINE 6.0 11/10/2020 1514   GLUCOSEU NEGATIVE 11/10/2020 1514   HGBUR MODERATE (A) 11/10/2020 1514   [place] 11/10/2020 1514   KETONESUR 5 (A) 11/10/2020 1514   PROTEINUR NEGATIVE 11/10/2020 1514   UROBILINOGEN 2.0 (H) 03/22/2013 2304   NITRITE NEGATIVE 11/10/2020 1514   LEUKOCYTESUR NEGATIVE 11/10/2020 1514   CXR 5/4: Stable cardiomegaly and postoperative changes with mild bibasilar atelectasis.  CT head w/o contrast 5/4: No acute finding. Chronic small vessel ischemia that is progressed from 2015.  CT soft tissue neck/chest 5/4: Recent dental extraction site within the right mandible compatible with the given history. No appreciable adjacent inflammatory stranding or soft tissue abscess. Mucosal thickening within the partially imaged ethmoid sinuses. Otherwise, no source of infection is identified within the neck. Atherosclerotic disease. It is suspected that calcified plaque at the origin of the right ICA results in a hemodynamically significant stenosis at this site. Carotid artery duplex is recommended for further evaluation.  Cervical spondylosis. Partially imaged left pleural effusion. Please refer to the concurrently performed chest CT for description of intrathoracic findings.  MRI brain 5/6: Technically limited exam due to extensive motion artifact. No definite acute intracranial abnormality. Age-related cerebral atrophy with moderate chronic small vessel ischemic disease, with probable remote lacunar infarcts involving the right frontal corona radiata, bilateral thalami, and left cerebellum.  Discharge Instructions: Discharge Instructions    (HEART FAILURE PATIENTS) Call MD:  Anytime you have any of the following symptoms: 1) 3 pound weight gain in 24 hours or 5 pounds in 1 week 2) shortness of breath, with or without a dry hacking cough 3) swelling in the hands, feet or stomach 4) if you have to sleep on extra pillows at night in order to breathe.   Complete by: As directed    Call MD for:  difficulty breathing, headache or visual disturbances   Complete by: As directed    Call MD for:  extreme fatigue   Complete by: As directed    Call MD for:  hives   Complete by: As directed    Call MD for:  persistant dizziness or light-headedness   Complete by: As directed    Call MD for:  persistant nausea and vomiting   Complete by: As directed    Call MD for:  redness, tenderness, or signs of infection (pain, swelling, redness, odor or green/yellow discharge around incision site)   Complete by: As directed    Call MD for:  severe uncontrolled pain   Complete by: As directed    Call MD for:  temperature >100.4   Complete by: As directed    Diet - low sodium heart healthy   Complete by: As directed    Discharge instructions   Complete by: As directed    Mr. Bhalla, I am so glad you are feeling better and can be discharged today! You were admitted for a blood stream infection, possibly from your recent dental procedure. We have taken a picture of your heart, which showed bacteria did not stick to your prosthetic  valve. Therefore, we would like for you to continue antibiotics through May 20th. Please see the following notes:  For antibiotics, you will take: Doxycycline 114m, twice daily starting on Friday, May 13th. You will take this for 8 more days.  We have also adjusted your blood pressure medications. You  will now take losartan 189m (previously you were taking 523m. You will also take amlodipine 1033maily. Otherwise continue taking the carvedilol and Eliquis (apixaban) like you were before.  Please make sure to follow-up with your primary care doctor within the next week or so.  It was a pleasure meeting you, Mr. ClaWhitecotton wish you the best and hope you stay happy and healthy!  Thank you, PhiSanjuan DameD   Increase activity slowly   Complete by: As directed      Signed: BraSanjuan DameD 11/16/2020, 3:27 PM   Pager: [phone]

## 2020-11-16 NOTE — Progress Notes (Signed)
Subjective: HD8  Patient evaluated at bedside this AM. He is understandably frustrated over re-scheduling of TEE, otherwise doing well. Denies chest pain, dyspnea, edema. Discussed plan for possible discharge today pending results.  Objective:  Vital signs in last 24 hours: Vitals:   11/15/20 0746 11/15/20 2150 11/16/20 0500 11/16/20 0537  BP:  124/68  (!) 154/83  Pulse:  (!) 56  67  Resp:  20  20  Temp:  98 F (36.7 C)  98.1 F (36.7 C)  TempSrc:    Oral  SpO2: 96% 99%  100%  Weight: 102.3 kg  102.6 kg   Height:       Physical Exam: General: Sitting up on side of bed, no acute distress CV: Regular rate, irregular rhythm. No m/r/g appreciated. Distal pulses 2+ bilaterally. Pulm: Clear to auscultation bilaterally. Normal work of breathing. Abdomen: Soft, non-tender, non-distended. Normoactive bowel sounds. MSK: No pitting edema bilaterally.  CBC Latest Ref Rng & Units 11/16/2020 11/15/2020 11/14/2020  WBC 4.0 - 10.5 K/uL 7.5 6.2 7.2  Hemoglobin 13.0 - 17.0 g/dL 1.7(C) 9.4(W) 9.6(P)  Hematocrit 39.0 - 52.0 % 32.3(L) 31.4(L) 30.5(L)  Platelets 150 - 400 K/uL 252 225 242   BMP Latest Ref Rng & Units 11/16/2020 11/15/2020 11/14/2020  Glucose 70 - 99 mg/dL 591(M) 384(Y) 97  BUN 8 - 23 mg/dL 11 8 8   Creatinine 0.61 - 1.24 mg/dL 6.59 9.35  BUN/Creat Ratio 10 - 24 - - -  Sodium 135 - 145 mmol/L 139 139 138  Potassium 3.5 - 5.1 mmol/L 3.2(L) 3.5 3.5  Chloride 98 - 111 mmol/L 104 104 106  CO2 22 - 32 mmol/L 25 26 23   Calcium 8.9 - 10.3 mg/dL 9.2 9.1 7.01)   Assessment/Plan: Mason Patton is 75yo c[place] (he/him) living with combined systolic and diastolic heart failure, CAD s/p CABG, aortic stenosis s/p AVR, persistent atrial fibrillation on Eliquis, OSA on CAPP, T2DM, COPD, prior CVA admitted 5/4 and found to have staph epidermidis bacteremia, clinically improved now waiting TEE with possible d/c today.  Principal Problem:   Sepsis (HCC) Active Problems:   Hypertension    Fever   OSA on CPAP   Uncontrolled type 2 diabetes mellitus with peripheral neuropathy (HCC)   Postablative hypothyroidism   Acute respiratory failure with hypoxia (HCC)   Staphylococcus epidermidis bacteremia  #Staph epidermidis bacteremia #Hx aortic stenosis s/p AVR Patient continues to be doing clinically well, afebrile, hemodynamically stable. R/p BCx no growth to date. Unfortunately, his TEE was rescheduled until this afternoon. If no vegetations will plan to discharge today on doxycycline for a total 14d course. Otherwise will need PICC for long-term IV vancomycin and consult to cardiothoracic surgery to evaluate prosthetic aortic valve. - IV vancomycin (day 6) - TEE this afternoon - D/c later this afternoon if no vegetations  #Chronic combined systolic and diastolic heart failure #Hypertension SBP stable with increase in amlodipine. Will hold off further titration of beta blocker given HR. No chest pain, dyspnea, or peripheral edema appreciated on exam.  - Amlodipine 10mg  qd - Carvedilol 12.5mg  BID - Lasix 40mg  qd - Losartan 100mg  qd  #Iron deficiency anemia Hgb stable, no bleeding on exam. Plan to re-start iron at discharge pending TEE results. - Daily CBC - Re-start oral iron  #Chronic persistent atrial fibrillation Rate controlled, no changes in medication - Continue home carvedilol, apixaban  #Type 2 DM A1c 7.4 in March. Glucose appropriate, no acute changes. - CBG monitoring - SSI  DIET: HH IVF: n/a  DVT PPX: home Eliquis BOWEL: Miralax, Senokot-S CODE: FULL FAM COM: n/a  Prior to Admission Living Arrangement: Home Anticipated Discharge Location: Home, family/patient preference Barriers to Discharge: medical management Dispo: Anticipated discharge in approximately 0-1 day(s).   Evlyn Kanner, MD 11/16/2020, 7:10 AM Pager: [phone] After 5pm on weekdays and 1pm on weekends: On Call pager [phone]

## 2020-11-16 NOTE — Progress Notes (Signed)
Patient remains NPO after midnight for a TEE procedure. Po Meds given with sips of water.

## 2020-11-16 NOTE — Transfer of Care (Signed)
Immediate Anesthesia Transfer of Care Note  Patient: Mason Patton  Procedure(s) Performed: TRANSESOPHAGEAL ECHOCARDIOGRAM (TEE) (N/A )  Patient Location: Endoscopy Unit  Anesthesia Type:MAC  Level of Consciousness: drowsy and patient cooperative  Airway & Oxygen Therapy: Patient Spontanous Breathing  Post-op Assessment: Report given to RN and Post -op Vital signs reviewed and stable  Post vital signs: Reviewed and stable  Last Vitals:  Vitals Value Taken Time  BP 114/55 11/16/20 1458  Temp    Pulse 64 11/16/20 1458  Resp 10 11/16/20 1459  SpO2 100 % 11/16/20 1458  Vitals shown include unvalidated device data.  Last Pain:  Vitals:   11/16/20 1458  TempSrc:   PainSc: 0-No pain      Patients Stated Pain Goal: 0 (25/48/62 8241)  Complications: No complications documented.

## 2020-11-16 NOTE — Anesthesia Preprocedure Evaluation (Signed)
Anesthesia Evaluation  Patient identified by MRN, date of birth, ID band Patient awake    Reviewed: Allergy & Precautions, NPO status , Patient's Chart, lab work & pertinent test results  Airway Mallampati: II  TM Distance: >3 FB Neck ROM: Full    Dental no notable dental hx.    Pulmonary sleep apnea , COPD,  COPD inhaler, Current Smoker and Patient abstained from smoking.,    Pulmonary exam normal breath sounds clear to auscultation       Cardiovascular hypertension, Pt. on medications and Pt. on home beta blockers + CAD  + dysrhythmias Atrial Fibrillation + Valvular Problems/Murmurs AS  Rhythm:Regular Rate:Normal + Systolic murmurs    Neuro/Psych CVA negative psych ROS   GI/Hepatic negative GI ROS, Neg liver ROS,   Endo/Other  diabetes, Oral Hypoglycemic AgentsHypothyroidism   Renal/GU negative Renal ROS     Musculoskeletal  (+) Arthritis ,   Abdominal (+) + obese,   Peds  Hematology  (+) anemia , HLD   Anesthesia Other Findings CAD  AS  Reproductive/Obstetrics                             Anesthesia Physical  Anesthesia Plan  ASA: III  Anesthesia Plan: MAC   Post-op Pain Management:    Induction: Intravenous  PONV Risk Score and Plan: 1 and Ondansetron and Treatment may vary due to age or medical condition  Airway Management Planned: Nasal Cannula  Additional Equipment: None  Intra-op Plan:   Post-operative Plan:   Informed Consent: I have reviewed the patients History and Physical, chart, labs and discussed the procedure including the risks, benefits and alternatives for the proposed anesthesia with the patient or authorized representative who has indicated his/her understanding and acceptance.     Dental advisory given  Plan Discussed with: CRNA  Anesthesia Plan Comments:         Anesthesia Quick Evaluation

## 2020-11-16 NOTE — Plan of Care (Signed)
Discharge instruction given to the patient and his wife and both stated understanding. Follow up appointments and medications to continue at home explained and stated understanding. Questions and concerns answered. VS checked and within normal limit. IV discontinued. Patient discharged via wheel chair. Patient hemodynamically stable during discharge.  Problem: Clinical Measurements: Goal: Ability to maintain clinical measurements within normal limits will improve Outcome: Adequate for Discharge Goal: Diagnostic test results will improve Outcome: Adequate for Discharge Goal: Respiratory complications will improve Outcome: Adequate for Discharge Goal: Cardiovascular complication will be avoided Outcome: Adequate for Discharge   Problem: Nutrition: Goal: Adequate nutrition will be maintained Outcome: Adequate for Discharge   Problem: Safety: Goal: Ability to remain free from injury will improve Outcome: Adequate for Discharge   Problem: Skin Integrity: Goal: Risk for impaired skin integrity will decrease Outcome: Adequate for Discharge   Problem: Education: Goal: Knowledge of General Education information will improve Description: Including pain rating scale, medication(s)/side effects and non-pharmacologic comfort measures Outcome: Adequate for Discharge   Problem: Health Behavior/Discharge Planning: Goal: Ability to manage health-related needs will improve Outcome: Adequate for Discharge   Problem: Clinical Measurements: Goal: Ability to maintain clinical measurements within normal limits will improve Outcome: Adequate for Discharge Goal: Will remain free from infection Outcome: Adequate for Discharge   Problem: Activity: Goal: Risk for activity intolerance will decrease Outcome: Adequate for Discharge   Problem: Nutrition: Goal: Adequate nutrition will be maintained Outcome: Adequate for Discharge   Problem: Coping: Goal: Level of anxiety will decrease Outcome:  Adequate for Discharge   Problem: Elimination: Goal: Will not experience complications related to bowel motility Outcome: Adequate for Discharge Goal: Will not experience complications related to urinary retention Outcome: Adequate for Discharge   Problem: Pain Managment: Goal: General experience of comfort will improve Outcome: Adequate for Discharge

## 2020-11-16 NOTE — Plan of Care (Signed)
  Problem: Clinical Measurements: Goal: Ability to maintain clinical measurements within normal limits will improve Outcome: Progressing Goal: Diagnostic test results will improve Outcome: Progressing Goal: Respiratory complications will improve Outcome: Progressing Goal: Cardiovascular complication will be avoided Outcome: Progressing   Problem: Nutrition: Goal: Adequate nutrition will be maintained Outcome: Progressing   Problem: Safety: Goal: Ability to remain free from injury will improve Outcome: Progressing   Problem: Skin Integrity: Goal: Risk for impaired skin integrity will decrease Outcome: Progressing   Problem: Education: Goal: Knowledge of General Education information will improve Description: Including pain rating scale, medication(s)/side effects and non-pharmacologic comfort measures Outcome: Progressing   Problem: Health Behavior/Discharge Planning: Goal: Ability to manage health-related needs will improve Outcome: Progressing   Problem: Clinical Measurements: Goal: Ability to maintain clinical measurements within normal limits will improve Outcome: Progressing Goal: Will remain free from infection Outcome: Progressing   Problem: Activity: Goal: Risk for activity intolerance will decrease Outcome: Progressing   Problem: Nutrition: Goal: Adequate nutrition will be maintained Outcome: Progressing   Problem: Coping: Goal: Level of anxiety will decrease Outcome: Progressing   Problem: Elimination: Goal: Will not experience complications related to bowel motility Outcome: Progressing Goal: Will not experience complications related to urinary retention Outcome: Progressing   Problem: Pain Managment: Goal: General experience of comfort will improve Outcome: Progressing   

## 2020-11-16 NOTE — CV Procedure (Signed)
    PROCEDURE NOTE:  Procedure:  Transesophageal echocardiogram Operator:  Armanda Magic, MD Indications:  Bacteremia Complications: None  During this procedure the patient is administered a total of Propofol 275 mg to achieve and maintain moderate conscious sedation.  The patient's heart rate, blood pressure, and oxygen saturation are monitored continuously during the procedure by Anesthesia.   Results: Normal LV size and function Normal RV size and function Normal RA Mildly dilated LA with normal small LA appendage with no evidence of thrombus. 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 Markedly lipomatous interatrial septum with no evidence of shunt by colorflow dopper  Normal thoracic and ascending aorta. No evidence of vegetation  The patient tolerated the procedure well and was transferred back to their room in stable condition.  Signed: Armanda Magic, MD [facility]

## 2020-11-16 NOTE — Discharge Instructions (Signed)
Acute Respiratory Failure, Adult Acute respiratory failure is a condition that is a medical emergency. It can develop quickly, and it should be treated right away. There are two types of acute respiratory failure:  Type I respiratory failure is when the lungs are not able to get enough oxygen into the blood. This causes the blood oxygen level to drop.  Type II respiratory failure is when carbon dioxide is not passing from the lungs out of the body. This causes carbon dioxide to build up in the blood. A person may have one type of acute respiratory failure or have both types at the same time. What are the causes? Common causes of type I respiratory failure include:  Trauma to the lung, chest, ribs, or tissues around the lung.  Pneumonia.  Lung diseases, such as pulmonary fibrosis or asthma.  Smoke, chemical, or water inhalation.  A blood clot in the lungs (pulmonary embolism).  A blood infection (sepsis).  Heart attack. Common causes of type II respiratory failure include:  Stroke.  A spinal cord injury.  A drug or alcohol overdose.  A blood infection (sepsis).  Cardiac arrest. What increases the risk? This condition is more likely to develop in people who have:  Lung diseases such as asthma or chronic obstructive pulmonary disease (COPD).  A condition that damages or weakens the muscles, nerves, bones, or tissues that are involved in breathing, such as myasthenia gravis or Guillain-Barr syndrome.  A serious infection.  A health problem that blocks the unconscious reflex that is involved in breathing, such as hypothyroidism or sleep apnea. What are the signs or symptoms? Trouble breathing is the main symptom of acute respiratory failure. Symptoms may also include:  Fast breathing.  Restlessness or anxiety.  Breathing loudly (wheezing) and grunting.  Fast or irregular heartbeats (palpitations).  Confusion or changes in behavior.  Feeling tired (fatigue),  sleeping more than normal, or being hard to wake.  Skin, lips, or fingernails that appear blue (cyanosis). How is this diagnosed? This condition may be diagnosed based on:  Your medical history and a physical exam. Your health care provider will listen to your heart and lungs to check for abnormal sounds.  Tests to confirm the diagnosis and determine the cause of respiratory failure. These tests may include: ? Measuring the amount of oxygen in your blood (pulse oximetry). The measurement comes from a small device that is placed on your finger, earlobe, or toe. ? Blood tests to measure blood oxygen and carbon dioxide and to look for signs of infection. ? Tests on a sample of the fluid that surrounds the spinal cord (cerebrospinal fluid) or a sample of fluid that is drawn from the windpipe (trachea) to check for infections. ? Chest X-ray. ? Electrocardiogram (ECG) to look at the heart's electrical activity.   How is this treated? Treatment for this condition usually takes place in a hospital intensive care unit (ICU). Treatment depends on what is causing the condition. It may include one or more of these treatments:  Oxygen may be given through your nose or a face mask.  A device such as a continuous positive airway pressure (CPAP) machine or bi-level positive airway pressure (BPAP) machine may be used to help you breathe. The device gives you oxygen and pressure.  Breathing treatments, fluids, and other medicines may be given.  A ventilator may be used to help you breathe. The machine gives you oxygen and pressure. A tube is put into your mouth and trachea to connect  the ventilator. ? If this treatment is needed longer term, a tracheostomy may be placed. A tracheostomy is a breathing tube put through your neck into your trachea.  In extreme cases, extracorporeal life support (ECLS) may be used. This treatment temporarily takes over the function of the heart and lungs, supplying oxygen and  removing carbon dioxide. ECLS gives the lungs a chance to recover. Follow these instructions at home: Medicines  Take over-the-counter and prescription medicines only as told by your health care provider.  If you were prescribed an antibiotic medicine, take it as told by your health care provider. Do not stop using the antibiotic even if you start to feel better.  If you are taking blood thinners: ? Talk with your health care provider before you take any medicines that contain aspirin or NSAIDs, such as ibuprofen. These medicines increase your risk for dangerous bleeding. ? Take your medicine exactly as told, at the same time every day. ? Avoid activities that could cause injury or bruising, and follow instructions about how to prevent falls. ? Wear a medical alert bracelet or carry a card that lists what medicines you take. General instructions  Return to your normal activities as told by your health care provider. Ask your health care provider what activities are safe for you.  Do not use any products that contain nicotine or tobacco, such as cigarettes, e-cigarettes, and chewing tobacco. If you need help quitting, ask your health care provider.  Do not drink alcohol if: ? Your health care provider tells you not to drink. ? You are pregnant, may be pregnant, or are planning to become pregnant.  Wear compression stockings as told by your health care provider. These stockings help to prevent blood clots and reduce swelling in your legs.  Attend any physical therapy and pulmonary rehabilitation as told by your health care provider.  Keep all follow-up visits as told by your health care provider. This is important. How is this prevented?  If you have an infection or a medical condition that may lead to acute respiratory failure, make sure you get proper treatment. Contact a health care provider if:  You have a fever.  Your symptoms do not improve or they get worse. Get help right  away if:  You are having trouble breathing.  You lose consciousness.  You develop a fast heart rate.  Your fingers, lips, or other areas turn blue.  You are confused. These symptoms may represent a serious problem that is an emergency. Do not wait to see if the symptoms will go away. Get medical help right away. Call your local emergency services (911 in the [place] Do not drive yourself to the hospital. Summary  Acute respiratory failure is a medical emergency. It can develop quickly, and it should be treated right away.  Treatment for this condition usually takes place in a hospital intensive care unit (ICU). Treatment may include oxygen, fluids, and medicines. A device may be used to help you breathe, such as a ventilator.  Take over-the-counter and prescription medicines only as told by your health care provider.  Contact a health care provider if your symptoms do not improve or if they get worse. This information is not intended to replace advice given to you by your health care provider. Make sure you discuss any questions you have with your health care provider. Document Revised: 06/11/2019 Document Reviewed: 06/11/2019 Elsevier Patient Education  2021 [employer]. Information on my medicine - ELIQUIS (apixaban)  This medication education  was reviewed with me or my healthcare representative as part of my discharge preparation.  The pharmacist that spoke with me during my hospital stay was:    Why was Eliquis prescribed for you? Eliquis was prescribed for you to reduce the risk of a blood clot forming that can cause a stroke if you have a medical condition called atrial fibrillation (a type of irregular heartbeat).  What do You need to know about Eliquis ? Take your Eliquis TWICE DAILY - one tablet in the morning and one tablet in the evening with or without food. If you have difficulty swallowing the tablet whole please discuss with your pharmacist how to take the  medication safely.  Take Eliquis exactly as prescribed by your doctor and DO NOT stop taking Eliquis without talking to the doctor who prescribed the medication.  Stopping may increase your risk of developing a stroke.  Refill your prescription before you run out.  After discharge, you should have regular check-up appointments with your healthcare provider that is prescribing your Eliquis.  In the future your dose may need to be changed if your kidney function or weight changes by a significant amount or as you get older.  What do you do if you miss a dose? If you miss a dose, take it as soon as you remember on the same day and resume taking twice daily.  Do not take more than one dose of ELIQUIS at the same time to make up a missed dose.  Important Safety Information A possible side effect of Eliquis is bleeding. You should call your healthcare provider right away if you experience any of the following: ? Bleeding from an injury or your nose that does not stop. ? Unusual colored urine (red or dark brown) or unusual colored stools (red or black). ? Unusual bruising for unknown reasons. ? A serious fall or if you hit your head (even if there is no bleeding).  Some medicines may interact with Eliquis and might increase your risk of bleeding or clotting while on Eliquis. To help avoid this, consult your healthcare provider or pharmacist prior to using any new prescription or non-prescription medications, including herbals, vitamins, non-steroidal anti-inflammatory drugs (NSAIDs) and supplements.  This website has more information on Eliquis (apixaban): http://www.eliquis.com/eliquis/home

## 2020-11-17 NOTE — Anesthesia Postprocedure Evaluation (Signed)
Anesthesia Post Note  Patient: Mason Patton  Procedure(s) Performed: TRANSESOPHAGEAL ECHOCARDIOGRAM (TEE) (N/A )     Patient location during evaluation: Endoscopy Anesthesia Type: MAC Level of consciousness: awake and alert Pain management: pain level controlled Vital Signs Assessment: post-procedure vital signs reviewed and stable Respiratory status: spontaneous breathing, nonlabored ventilation, respiratory function stable and patient connected to nasal cannula oxygen Cardiovascular status: stable and blood pressure returned to baseline Postop Assessment: no apparent nausea or vomiting Anesthetic complications: no   No complications documented.  Last Vitals:  Vitals:   11/16/20 1528 11/16/20 1553  BP: (!) 161/59 126/71  Pulse: 64 70  Resp: 16 18  Temp:  (!) 36.4 C  SpO2: 100% 100%    Last Pain:  Vitals:   11/16/20 1553  TempSrc: Oral  PainSc: 0-No pain                 Rosabell Geyer S

## 2020-11-18 LAB — CULTURE, BLOOD (ROUTINE X 2)
Culture: NO GROWTH
Culture: NO GROWTH
Special Requests: ADEQUATE
Special Requests: ADEQUATE

## 2020-11-19 ENCOUNTER — Encounter (HOSPITAL_COMMUNITY): Payer: Self-pay | Admitting: Cardiology

## 2020-11-27 ENCOUNTER — Telehealth: Payer: Self-pay | Admitting: *Deleted

## 2020-11-27 NOTE — Telephone Encounter (Signed)
Our office received a clearance request today for the pt. On the clearance request it states 2nd attempt. I tried x 2 to reach the dental office though dental office is now closed. I was calling to confirm if this is the same clearance that was back 10/09/20. I messaged our pre op provider for today Mason Patton, [facility] for further input on the clearance back 10/09/20, not sure if this was a duplicate as it states 2nd attempt. 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 He was cleared to have dental extraction. Then admitted x 2 with pneumonia, CHF, sepsis. Probably a new request. He needs antibiotics too. Go ahead and create a new request and send to preop   Will have to try and reach dental office tomorrow.

## 2020-11-28 MED ORDER — AMOXICILLIN 500 MG PO TABS: ORAL_TABLET | ORAL | 2 refills | Status: DC

## 2020-11-28 NOTE — Telephone Encounter (Signed)
Notes sent to dentist.  Pt needs antibiotics prior to dental work.   Allergies  Allergen Reactions  . Codeine Nausea And Vomiting  . Demerol Nausea And Vomiting    Therefore, he should take Amoxicillin 2000 mg 30-60 mins prior to dental work.  Please confirm his allergies and if not allergic to PCN, send in Rx to his pharmacy.  Tereso Newcomer, PA-C    11/28/2020 10:02 AM

## 2020-11-28 NOTE — Telephone Encounter (Addendum)
   Patient Name:  Mason Patton  DOB:  February 20, 1946  MRN:  [id number]   Primary Cardiologist: [name], MD  Chart reviewed as part of pre-operative protocol coverage.   Simple dental extractions are considered low risk procedures per guidelines and generally do not require any specific cardiac clearance. It is also generally accepted that for simple extractions and dental cleanings, there is no need to interrupt blood thinner (anticoagulation) therapy.  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  Recommendations:  SBE prophylaxis is required for the patient from a cardiac standpoint.   Unless dental cleaning is felt to be urgent, it would be best to wait until the patient is at least 3 mos out from surgery (after 12/12/2020).   Continue Apixaban (Eliquis) and aspirin without interruption.   Please call with questions. Tereso Newcomer, PA-C 11/28/2020, 9:54 AM

## 2020-11-28 NOTE — Telephone Encounter (Signed)
Spoke with wife, Dois Davenport, [place] on file.  She confirmed pt is not allergic to Penicillin and that pt will need to take Amoxicillin, 500 mg taking all 4 tablets 30-60 mins prior to his dental cleaning on Thursday.   Prescription sent to [facility], per [facility] request.

## 2020-11-28 NOTE — Telephone Encounter (Signed)
   [place] Pre-operative Risk Assessment    Patient Name: Mason Patton  DOB: 01-17-1946  MRN: [id number]   HEARTCARE STAFF: - Please ensure there is not already an duplicate clearance open for this procedure. - Under Visit Info/Reason for Call, type in Other and utilize the format Clearance MM/DD/YY or Clearance TBD. Do not use dashes or single digits. - If request is for dental extraction, please clarify the # of teeth to be extracted.  Request for surgical clearance:  1. What type of surgery is being performed?  DENTAL CLEANING   2. When is this surgery scheduled?  11/30/2020   3. What type of clearance is required (medical clearance vs. Pharmacy clearance to hold med vs. Both)?  MEDICAL AND DOES PT NEED ABX PRIOR TO?  4. Are there any medications that need to be held prior to surgery and how long? N/A   5. Practice name and name of physician performing surgery?  [facility] / DR. Tobey Grim   6. What is the office phone number? [phone]   7.   What is the office fax number?  [phone]  8.   Anesthesia type (None, local, MAC, general) ?    Jeanann Lewandowsky 11/28/2020, 8:45 AM  _________________________________________________________________   (provider comments below)

## 2020-12-15 ENCOUNTER — Telehealth: Payer: Self-pay | Admitting: Cardiology

## 2020-12-15 NOTE — Telephone Encounter (Signed)
Pts wife is calling to report to Dr. Shari Prows that the pt has been having ongoing issues with nausea and dizziness for a couple weeks.  Wife states the nausea is everyday, as well as the dizziness.  She states he got so dizzy this past Monday while he was with her at her doctors appt, that he fell in the parking lot.  She states he had no injuries. Wife attributes his dizziness and nausea to all his cardiac meds he's taking.  She states the symptoms usually occur in the morning times, an hour or so after taking his morning meds, which are listed below:-  -first dose eliquis -first dose coreg -losartan -amlodipine -lasix -first dose metformin  Pt takes atorvastatin in PM.  Wife has no BP/HR readings to provide me, for they are not monitoring this at home. Wife states his PCP gave them zofran as needed for the nausea, which sometimes helps. Wife voiced no other cardiac complaints pt is experiencing,  like chest pain, sob, doe, palpitations, and lower extremity edema.  Wife states that she thinks he is on too many cardiac meds, and this is causing his dizziness and nausea.   Asked the pts wife if he was eating meals with his morning medications and she states sometimes he does and sometimes he doesn't.  Asked about his hydration, and she said he doesn't drink water as much as he use to when he worked.  Pt is also diabetic, however she has no blood sugars to report.  Pt recently had CABG back in March. He has an appt with Dr. Shari Prows on 6/27.  Wife is seeking further advisement on this matter, in the interim while waiting to see Dr. Shari Prows on 6/27.  Advised the pts wife that he should be eating meals with med administration.  Advised her they should be monitoring his BS closely, as well as monitoring his BP/HR one hr after meds taken.  Pt education provided on this.  Advised the pts wife that he should be hydrating accordingly, just not aggressively due to heart failure.  Advised her that  he should be changing positions slowly to avoid orthostatic hypotension.  Advised her he may need to use assistance when walking long distances, like a cane/walker, to prevent further falls.   He can use compressions as needed during the day time.   Informed the pts wife that I will route this message to our Pharmacist and Dr. Shari Prows for further advisement on this matter.  Informed the pts wife that I will follow-up with them accordingly thereafter.  Wife verbalized understanding and agrees with this plan.

## 2020-12-15 NOTE — Telephone Encounter (Signed)
STAT if patient feels like he/she is going to faint   Are you dizzy now? no  Do you feel faint or have you passed out? passed  Do you have any other symptoms? nause  Have you checked your HR and BP (record if available)? no  Patient called wanting to speak to the nurse in regards to him passing out earlier this week

## 2020-12-15 NOTE — Telephone Encounter (Signed)
[address] patient.   Thank you!

## 2020-12-15 NOTE — Telephone Encounter (Signed)
Metformin may certainly be the cause of his nausea, especially if he is not eating with this. I agree that blood pressures/HR would be very helpful to determine if they are too low or too high.  His scr on 5/16 was up a little from him baseline (0.8 up to 1.34) I would assess swelling and see if maybe furosemide dose needs to be decreased.

## 2020-12-18 MED ORDER — FUROSEMIDE 20 MG PO TABS
20.0000 mg | ORAL_TABLET | Freq: Every day | ORAL | 0 refills | Status: DC
Start: 2020-12-18 — End: 2021-01-19

## 2020-12-18 NOTE — Telephone Encounter (Signed)
Spoke to the pts wife Mason Patton (on [place]) and endorsed to her recommendations per Dr. Shari Prows and Pharmacist Efraim Kaufmann. Wife states the pts lower extremities are pretty stable and no worsening edema.  His weight is also stable. Advised the pts wife that he needs to be eating full meals with his meds, especially his Metformin, to avoid feeling nauseated.  Also advised her that they should start monitoring his BP/HR at home, for this is helpful in dosing his meds. Informed the pts wife that being he is not experiencing increased edema, I will route this message to Dr. Shari Prows and [facility], to further advise on dosing of lasix, and is it ok to decrease this from 40 mg to 20 mg po daily, to see if this helps with his complaints? Informed the pts wife that I will follow-up with her accordingly thereafter, once further advisement is provided.  Wife verbalized understanding and agrees with this plan.

## 2020-12-18 NOTE — Telephone Encounter (Signed)
Yes, please decrease to 20mg  daily and keep track of daily weights

## 2020-12-18 NOTE — Telephone Encounter (Signed)
Completely agree. Thank you both!!

## 2020-12-18 NOTE — Telephone Encounter (Signed)
Called the pts wife Dois Davenport back and endorsed to her that per our Pharmacist Malena Peer, we will decrease the pts lasix now to 20 mg po daily, and they should keep track of his weights as well as BP/HR daily. Pt education provided to the wife on alarming weight parameters to look out for, if they were to occur.  Confirmed the pharmacy of choice with the pts wife.  She states to not send in the refill of lasix 20 mg po daily to the pharmacy yet, for they have plenty 20s on hand at this time, and will call when they need a refill.  Noted this in pts rx.  Wife states his BP this morning was excellent at 118/78. Pt is doing well she reports. Informed the pts wife we will see him as planned on 6/27. Wife verbalized understanding and agrees with this plan.

## 2020-12-18 NOTE — Telephone Encounter (Signed)
Kris, No - 12/15/2020  3:53 PM Shari Prows Kathlynn Grate, MD  Sent: Caleen Essex December 15, 2020  8:05 PM  To: Olene Floss, RPH-CPP; [name], LPN          Message  Totally agree. We may need to decrease lasix and if we need to stop a BP med, would drop the amlodipine first.  ----- Message -----  From: Olene Floss, RPH-CPP  Sent: 12/15/2020   4:58 PM EDT  To: [name], LPN, [name], MD

## 2020-12-20 ENCOUNTER — Other Ambulatory Visit: Payer: Self-pay | Admitting: Cardiology

## 2020-12-20 MED ORDER — AMLODIPINE BESYLATE 5 MG PO TABS: 5.0000 mg | ORAL_TABLET | Freq: Every day | ORAL | 3 refills | Status: DC

## 2020-12-20 NOTE — Progress Notes (Deleted)
Cardiology Office Note:    Date:  12/20/2020   ID:  Mason Patton, DOB [birth year], MRN [id number]  PCP:  Roger Kill, PA-C   [facility] Providers Cardiologist:  [name], MD { Click to update primary MD,subspecialty MD or APP then REFRESH:1}    Referring MD: Roger Kill, *   No chief complaint on file. ***  History of Present Illness:    Mason Patton is a 75 y.o. male with a hx of ***  Past Medical History:  Diagnosis Date   Aortic stenosis    09/30/19 echo ([facility], Cardiologist Dr. Clovis Riley): Mild-moderate AS, MaxVel 308 cm/s, MeanPG 22 mmHg, AVA 1.6 cm2   Arthritis    Atrial fibrillation (HCC)    Carotid artery stenosis    11/03/19 [place] ([facility]): 50-69% RICA stenosis   Cholecystitis with cholelithiasis    Eczema    Emphysema of lung (HCC)    Gallstones and inflammation of gallbladder without obstruction    Heart murmur    History of nuclear stress test 02/23/2010   dipyridamole; normal pattern of perfusion in all regions; normal, low risk study    Hyperlipidemia    Hypertension    Hypothyroidism    "had thyroid killed w/radiation" (05/19/2013)   Meningoencephalitis    Obesity    OSA on CPAP    last sleep study >8 yrs   Pneumonia    "twice" (05/19/2013)   Shortness of breath    Stroke [facility])    "they said I had a minor one when I had menigitis" (05/19/2013)   Thyroid disease    Tobacco abuse    quit 11/08/2012   Type II diabetes mellitus (HCC)    Umbilical hernia     Past Surgical History:  Procedure Laterality Date   AORTIC VALVE REPLACEMENT N/A 09/11/2020   Procedure: AORTIC VALVE REPLACEMENT (AVR);  Surgeon: Alleen Borne, MD;  Location: [facility] OR;  Service: Open Heart Surgery;  Laterality: N/A;   CHOLECYSTECTOMY  09/09/2011   Procedure: LAPAROSCOPIC CHOLECYSTECTOMY WITH INTRAOPERATIVE CHOLANGIOGRAM;  Surgeon: Jetty Duhamel, MD;  [facility] OR;  Service: General;  Laterality: N/A;   CLIPPING OF  ATRIAL APPENDAGE N/A 09/11/2020   Procedure: CLIPPING OF ATRIAL APPENDAGE;  Surgeon: Alleen Borne, MD;  Location: [facility] OR;  Service: Open Heart Surgery;  Laterality: N/A;   CORONARY ARTERY BYPASS GRAFT N/A 09/11/2020   Procedure: CORONARY ARTERY BYPASS GRAFTING (CABG), ON PUMP, TIMES 2 , USING LEFT INTERNAL MAMMARY ARTERY AND ENDOSCOPICALLY HARVESTED RIGHT GREATER SAPHENOUS VEIN;  Surgeon: Alleen Borne, MD;  Location: [facility] OR;  Service: Open Heart Surgery;  Laterality: N/A;   HERNIA REPAIR  04/2006   UHR   IR THORACENTESIS ASP PLEURAL SPACE W/IMG GUIDE  10/25/2020   JOINT REPLACEMENT     KNEE ARTHROSCOPY Right    "w2 times" (05/19/2013)   RIGHT/LEFT HEART CATH AND CORONARY ANGIOGRAPHY N/A 09/07/2020   Procedure: RIGHT/LEFT HEART CATH AND CORONARY ANGIOGRAPHY;  Surgeon: Runell Gess, MD;  [facility] INVASIVE CV LAB;  Service: Cardiovascular;  Laterality: N/A;   TEE WITHOUT CARDIOVERSION N/A 09/11/2020   Procedure: TRANSESOPHAGEAL ECHOCARDIOGRAM (TEE);  Surgeon: Alleen Borne, MD;  Location: [facility] OR;  Service: Open Heart Surgery;  Laterality: N/A;   TEE WITHOUT CARDIOVERSION N/A 11/16/2020   Procedure: TRANSESOPHAGEAL ECHOCARDIOGRAM (TEE);  Surgeon: Quintella Reichert, MD;  Location: [facility] ENDOSCOPY;  Service: Cardiovascular;  Laterality: N/A;   TONSILLECTOMY     TOTAL KNEE ARTHROPLASTY Left  05/19/2013   TOTAL KNEE ARTHROPLASTY Left 05/19/2013   Procedure: TOTAL KNEE ARTHROPLASTY;  Surgeon: Nadara Mustard, MD;  [facility] OR;  Service: Orthopedics;  Laterality: Left;  Left Total Knee Arthroplasty   TOTAL KNEE ARTHROPLASTY Right 11/24/2019   TOTAL KNEE ARTHROPLASTY Right 11/24/2019   Procedure: RIGHT TOTAL KNEE ARTHROPLASTY;  Surgeon: Nadara Mustard, MD;  Location: [facility] OR;  Service: Orthopedics;  Laterality: Right;   TRANSTHORACIC ECHOCARDIOGRAM  03/28/2005   EF=>55%, mod conc LVH; LA mod dilated & borderline RA enlargement; mild TR; mild pulm valve regurg    Current Medications: No outpatient  medications have been marked as taking for the 01/01/21 encounter (Appointment) with [name], MD.     Allergies:   Codeine and Demerol   Social History   Socioeconomic History   Marital status: Married    Spouse name: Mason Patton   Number of children: 3   Years of education: tech   Highest education level: Not on file  Occupational History    Employer: [employer]  Tobacco Use   Smoking status: Every Day    Packs/day: 0.50    Years: 50.00    Pack years: 25.00    Types: Cigarettes   Smokeless tobacco: Never  Vaping Use   Vaping Use: Never used  Substance and Sexual Activity   Alcohol use: No   Drug use: No   Sexual activity: Yes  Other Topics Concern   Not on file  Social History Narrative   Not on file   Social Determinants of Health   Financial Resource Strain: Not on file  Food Insecurity: Not on file  Transportation Needs: Not on file  Physical Activity: Not on file  Stress: Not on file  Social Connections: Not on file     Family History: The patient's ***family history includes Asthma in his child; Cancer in his maternal aunt and maternal aunt; Diabetes in his maternal grandmother; Diabetes type I in his child; Hyperlipidemia in his father; Thyroid disease in his mother.  ROS:   Please see the history of present illness.   All other systems reviewed and are negative.  EKGs/Labs/Other Studies Reviewed:    The following studies were reviewed today:  EKG:  EKG is *** ordered today.  The ekg ordered today demonstrates ***  Recent Labs: 10/17/2020: NT-Pro BNP 1,481 11/05/2020: TSH 0.185 11/08/2020: ALT 24; B Natriuretic Peptide 1,095.9 11/11/2020: Magnesium 1.8 11/16/2020: BUN 11; Creatinine, Ser 0.80; Hemoglobin 9.3; Platelets 252; Potassium 3.2; Sodium 139  Recent Lipid Panel    Component Value Date/Time   CHOL 106 04/12/2016 1423   TRIG 109.0 04/12/2016 1423   HDL 27.50 (L) 04/12/2016 1423   CHOLHDL 4 04/12/2016 1423   VLDL 21.8 04/12/2016 1423    LDLCALC 57 04/12/2016 1423     Risk Assessment/Calculations:   {Does this patient have ATRIAL FIBRILLATION?:[id number]}   Physical Exam:    VS:  There were no vitals taken for this visit.    Wt Readings from Last 3 Encounters:  11/16/20 226 lb 3.1 oz (102.6 kg)  11/07/20 239 lb 11.2 oz (108.7 kg)  11/04/20 240 lb (108.9 kg)     GEN: *** Well nourished, well developed in no acute distress HEENT: Normal NECK: No JVD; No carotid bruits LYMPHATICS: No lymphadenopathy CARDIAC: ***RRR, no murmurs, rubs, gallops RESPIRATORY:  Clear to auscultation without rales, wheezing or rhonchi  ABDOMEN: Soft, non-tender, non-distended MUSCULOSKELETAL:  No edema; No deformity  SKIN: Warm and dry NEUROLOGIC:  Alert and  oriented x 3 PSYCHIATRIC:  Normal affect   ASSESSMENT:    No diagnosis found. PLAN:    In order of problems listed above:     {Are you ordering a CV Procedure (e.g. stress test, cath, DCCV, TEE, etc)?   Press F2        :[id number]}    Medication Adjustments/Labs and Tests Ordered: Current medicines are reviewed at length with the patient today.  Concerns regarding medicines are outlined above.  No orders of the defined types were placed in this encounter.  No orders of the defined types were placed in this encounter.   There are no Patient Instructions on file for this visit.   Signed, [name], MD  12/20/2020 3:02 PM    [facility]

## 2020-12-21 NOTE — Telephone Encounter (Signed)
-----   Message from [name], MD sent at 12/21/2020  1:05 PM EDT ----- Can we get him closer follow-up or a DOD spot? He seems to be having a hard time with blood pressure and fluid and he is tenuous to begin with.  Thank you!!

## 2020-12-25 NOTE — Progress Notes (Signed)
Cardiology Office Note:    Date:  12/25/2020   ID:  Mason Patton, DOB 08-22-1945, MRN [id number]  PCP:  Bernita Buffy Referring MD: [facility]  Cardiologist:  [name], MD   Reason for visit: Hypotension  History of Present Illness:    Mason Patton is a 75 y.o. male with a hx of persistent atrial fibrillation, CAD, Aortic stenosis, S/p CABG + bioprosthetic AVR in 09/2020, HFpEF, hx of tobacco use - 1/2 ppd, HTN OSA on CPAP, DM, HLP, mod right carotid stenosis by duplex 10/2019 and hypothyroidism.  Mason Patton was last seen in clinic by Jacolyn Reedy, PA-C in 4/22 for clearance for upcoming dental work.  He was admitted 5/1-5/3 with decompensated HF in the setting of pneumonia.  He was treated with a combination of antibiotics and IV furosemide.  His hs-Trops were mildly elevated without trend c/w demand ischemia.  He was readmitted 5/4-5/12 with sepsis treated with IV antibiotics.  TTE and TEE did not show vegetation.  His BP meds were held in the setting of sepsis but later resumed and adjusted for high BP.  He was given iron due to iron deficiency anemia.  Post DC, he contacted the office via MyChart with low Bps (dipping as low as 90s/50s) with associated nausea & dizziness.  His Amlodipine was DC'd, Losartan reduced and lasix was decreased from 40mg  to 20mg  daily.  He returns for f/u.   Today, he is accompanied by his wife.  Patient states that 2 weeks ago, he felt nausea, lightheaded and then had a syncopal episode after walking in a parking lot.  The remainder of the week, he felt slightly lightheaded.  Then 1 week ago, he felt slightly dizzy and had a near syncopal episode while getting his CPAP on that night.  After this 2nd episode, he called our office.  Since stopping amlodipine, reducing losartan from 100 to 50mg  daily and reducing lasix from 40mg  to 20mg  daily, he has not had further lightheadedness or  syncope.  He ambulated to the office today without difficulty.  He notes BP from 100s-140s/50-60s, averaging SBP 120-130s.  His wife notes minimal LE edema and some weight gain.  Pt states his dry weight is ~230lbs, weight this AM at home was 239.  However, wife states he is back to his normal appetite after months of decreased appetite.    Otherwise, pt denies other HF symptoms - no orthopnea/PND, DOE and bloating.  No chest pain, palpitations or bleeding.  Wife mentions recent sneezing and congestion.  Pt has been taking Mucinex.      Past Medical History:  Diagnosis Date   Aortic stenosis    09/30/19 echo ([facility], Cardiologist Dr. Clovis Riley): Mild-moderate AS, MaxVel 308 cm/s, MeanPG 22 mmHg, AVA 1.6 cm2   Arthritis    Atrial fibrillation (HCC)    Carotid artery stenosis    11/03/19 [place] ([facility]): 50-69% RICA stenosis   Cholecystitis with cholelithiasis    Eczema    Emphysema of lung (HCC)    Gallstones and inflammation of gallbladder without obstruction    Heart murmur    History of nuclear stress test 02/23/2010   dipyridamole; normal pattern of perfusion in all regions; normal, low risk study    Hyperlipidemia    Hypertension    Hypothyroidism    "had thyroid killed w/radiation" (05/19/2013)   Meningoencephalitis    Obesity    OSA on CPAP  last sleep study >8 yrs   Pneumonia    "twice" (05/19/2013)   Shortness of breath    Stroke [facility])    "they said I had a minor one when I had menigitis" (05/19/2013)   Thyroid disease    Tobacco abuse    quit 11/08/2012   Type II diabetes mellitus (HCC)    Umbilical hernia     Past Surgical History:  Procedure Laterality Date   AORTIC VALVE REPLACEMENT N/A 09/11/2020   Procedure: AORTIC VALVE REPLACEMENT (AVR);  Surgeon: Alleen Borne, MD;  Location: [facility] OR;  Service: Open Heart Surgery;  Laterality: N/A;   CHOLECYSTECTOMY  09/09/2011   Procedure: LAPAROSCOPIC CHOLECYSTECTOMY WITH INTRAOPERATIVE CHOLANGIOGRAM;  Surgeon:  Jetty Duhamel, MD;  Location: [facility] OR;  Service: General;  Laterality: N/A;   CLIPPING OF ATRIAL APPENDAGE N/A 09/11/2020   Procedure: CLIPPING OF ATRIAL APPENDAGE;  Surgeon: Alleen Borne, MD;  Location: [facility] OR;  Service: Open Heart Surgery;  Laterality: N/A;   CORONARY ARTERY BYPASS GRAFT N/A 09/11/2020   Procedure: CORONARY ARTERY BYPASS GRAFTING (CABG), ON PUMP, TIMES 2 , USING LEFT INTERNAL MAMMARY ARTERY AND ENDOSCOPICALLY HARVESTED RIGHT GREATER SAPHENOUS VEIN;  Surgeon: Alleen Borne, MD;  Location: [facility] OR;  Service: Open Heart Surgery;  Laterality: N/A;   HERNIA REPAIR  04/2006   UHR   IR THORACENTESIS ASP PLEURAL SPACE W/IMG GUIDE  10/25/2020   JOINT REPLACEMENT     KNEE ARTHROSCOPY Right    "w2 times" (05/19/2013)   RIGHT/LEFT HEART CATH AND CORONARY ANGIOGRAPHY N/A 09/07/2020   Procedure: RIGHT/LEFT HEART CATH AND CORONARY ANGIOGRAPHY;  Surgeon: Runell Gess, MD;  [facility] INVASIVE CV LAB;  Service: Cardiovascular;  Laterality: N/A;   TEE WITHOUT CARDIOVERSION N/A 09/11/2020   Procedure: TRANSESOPHAGEAL ECHOCARDIOGRAM (TEE);  Surgeon: Alleen Borne, MD;  Location: [facility] OR;  Service: Open Heart Surgery;  Laterality: N/A;   TEE WITHOUT CARDIOVERSION N/A 11/16/2020   Procedure: TRANSESOPHAGEAL ECHOCARDIOGRAM (TEE);  Surgeon: Quintella Reichert, MD;  Location: [facility] ENDOSCOPY;  Service: Cardiovascular;  Laterality: N/A;   TONSILLECTOMY     TOTAL KNEE ARTHROPLASTY Left 05/19/2013   TOTAL KNEE ARTHROPLASTY Left 05/19/2013   Procedure: TOTAL KNEE ARTHROPLASTY;  Surgeon: Nadara Mustard, MD;  Location: [facility] OR;  Service: Orthopedics;  Laterality: Left;  Left Total Knee Arthroplasty   TOTAL KNEE ARTHROPLASTY Right 11/24/2019   TOTAL KNEE ARTHROPLASTY Right 11/24/2019   Procedure: RIGHT TOTAL KNEE ARTHROPLASTY;  Surgeon: Nadara Mustard, MD;  Location: [facility] OR;  Service: Orthopedics;  Laterality: Right;   TRANSTHORACIC ECHOCARDIOGRAM  03/28/2005   EF=>55%, mod conc LVH; LA mod dilated & borderline RA  enlargement; mild TR; mild pulm valve regurg    Current Medications: No outpatient medications have been marked as taking for the 12/26/20 encounter (Appointment) with Tereso Newcomer T, PA-C.     Allergies:   Codeine and Demerol   Social History   Socioeconomic History   Marital status: Married    Spouse name: sandra   Number of children: 3   Years of education: tech   Highest education level: Not on file  Occupational History    Employer: [employer]  Tobacco Use   Smoking status: Every Day    Packs/day: 0.50    Years: 50.00    Pack years: 25.00    Types: Cigarettes   Smokeless tobacco: Never  Vaping Use   Vaping Use: Never used  Substance and Sexual Activity   Alcohol use: No  Drug use: No   Sexual activity: Yes  Other Topics Concern   Not on file  Social History Narrative   Not on file   Social Determinants of Health   Financial Resource Strain: Not on file  Food Insecurity: Not on file  Transportation Needs: Not on file  Physical Activity: Not on file  Stress: Not on file  Social Connections: Not on file     Family History: The patient's family history includes Asthma in his child; Cancer in his maternal aunt and maternal aunt; Diabetes in his maternal grandmother; Diabetes type I in his child; Hyperlipidemia in his father; Thyroid disease in his mother.  ROS:   Please see the history of present illness.     EKGs/Labs/Other Studies Reviewed:    Recent Labs: 10/17/2020: NT-Pro BNP 1,481 11/05/2020: TSH 0.185 11/08/2020: ALT 24; B Natriuretic Peptide 1,095.9 11/11/2020: Magnesium 1.8 11/16/2020: BUN 11; Creatinine, Ser 0.80; Hemoglobin 9.3; Platelets 252; Potassium 3.2; Sodium 139  Recent Lipid Panel    Component Value Date/Time   CHOL 106 04/12/2016 1423   TRIG 109.0 04/12/2016 1423   HDL 27.50 (L) 04/12/2016 1423   CHOLHDL 4 04/12/2016 1423   VLDL 21.8 04/12/2016 1423   LDLCALC 57 04/12/2016 1423    Physical Exam:    VS:  There were no vitals taken for  this visit.    Wt Readings from Last 3 Encounters:  11/16/20 226 lb 3.1 oz (102.6 kg)  11/07/20 239 lb 11.2 oz (108.7 kg)  11/04/20 240 lb (108.9 kg)     GEN:  Well nourished, well developed in no acute distress HEENT: Normal NECK: JVD to low neck at 90 degrees; No carotid bruits CARDIAC: Irreg irreg, no murmurs, rubs, gallops RESPIRATORY:  Clear to auscultation without rales, wheezing or rhonchi  ABDOMEN: Soft, non-tender, non-distended MUSCULOSKELETAL: Minimal bilateral edema to low shins; No deformity  SKIN: Warm and dry NEUROLOGIC:  Alert and oriented PSYCHIATRIC:  Normal affect   ASSESSMENT AND PLAN    Hypotension, improved. History of HTN --> Recommend continuing Losartan 50mg  daily, Coreg 12.5mg  BID and lasix 20mg  daily.   --> Now BP has been stable x 1 week, okay to check BP once every other day or when symptomatic. --> Stay hydrated in the heat.   HFpEF, minimally volume overloaded.   --> Difficult to assess dry weight given increased appetite post recovery.   --> Double lasix x 3 days if weight gain >3 lbs in 1 day or 5 lbs in 1 week.   Call our office if needing to take extra lasix on a routine basis.   --> Continue losartan and Coreg.  --> Pt would be a good candidate for spironolactone 12.5mg  daily given mild hypokalemia - consider when BP stabilizes.  (Pt had hx of hyperkalemia when he was taking potassium supplements, would need to monitor K closely)  CAD s/p CABG and bioprosthetic AVR 09/11/20  - no PVL on echo 11/08/2020 - no SOB, CP or overt HF --> Continue current medications. --> Will reach out to cardiac rehab to ensure pt is still in queue.  This would be the best intervention for him currently.   --> Would avoid mod-heavy exertion for at least 2 more weeks until BP stabilizes further, then would put him 4 months postop.    Persistent A-fib s/p left atrial clipping - Cont Eliquis and Coreg.    Disposition: Follow-up with Dr. 11/11/20 or 01/08/2021 PA-C  in 6 weeks.  Call if issues in the  meanwhile.       Medication Adjustments/Labs and Tests Ordered: Current medicines are reviewed at length with the patient today.  Concerns regarding medicines are outlined above.  No orders of the defined types were placed in this encounter.  No orders of the defined types were placed in this encounter.   There are no Patient Instructions on file for this visit.   Signed, Cannon Kettle, PA-C  12/25/2020 11:00 PM    [facility]

## 2020-12-26 ENCOUNTER — Ambulatory Visit (INDEPENDENT_AMBULATORY_CARE_PROVIDER_SITE_OTHER): Payer: No Typology Code available for payment source | Admitting: Physician Assistant

## 2020-12-26 ENCOUNTER — Telehealth: Payer: Self-pay | Admitting: *Deleted

## 2020-12-26 ENCOUNTER — Encounter: Payer: Self-pay | Admitting: Physician Assistant

## 2020-12-26 ENCOUNTER — Other Ambulatory Visit: Payer: Self-pay

## 2020-12-26 VITALS — BP 124/50 | HR 82 | Ht 70.0 in | Wt 245.0 lb

## 2020-12-26 DIAGNOSIS — I10 Essential (primary) hypertension: Secondary | ICD-10-CM

## 2020-12-26 DIAGNOSIS — I4819 Other persistent atrial fibrillation: Secondary | ICD-10-CM | POA: Diagnosis not present

## 2020-12-26 DIAGNOSIS — I5032 Chronic diastolic (congestive) heart failure: Secondary | ICD-10-CM

## 2020-12-26 DIAGNOSIS — Z952 Presence of prosthetic heart valve: Secondary | ICD-10-CM

## 2020-12-26 DIAGNOSIS — I25810 Atherosclerosis of coronary artery bypass graft(s) without angina pectoris: Secondary | ICD-10-CM

## 2020-12-26 DIAGNOSIS — E782 Mixed hyperlipidemia: Secondary | ICD-10-CM

## 2020-12-26 DIAGNOSIS — I350 Nonrheumatic aortic (valve) stenosis: Secondary | ICD-10-CM

## 2020-12-26 MED ORDER — LOSARTAN POTASSIUM 50 MG PO TABS: 50.0000 mg | ORAL_TABLET | Freq: Every day | ORAL | 11 refills | Status: DC

## 2020-12-26 NOTE — Telephone Encounter (Signed)
Pt was seen by Tereso Newcomer, PA today and requested to check on status of cardiac rehab ordered in march.  Sent a message to Gladstone Lighter, RN at cardiac rehab and was sent to nurse navigator. Pt was to be checking on options at [facility] which also does cardiac rehab. Cardiac rehab will reach out to pt to see what pt decides to do.

## 2020-12-26 NOTE — Patient Instructions (Signed)
Medication Instructions:  Your physician recommends that you continue on your current medications as directed. Please refer to the Current Medication list given to you today.  *If you need a refill on your cardiac medications before your next appointment, please call your pharmacy*   Lab Work:  -None  If you have labs (blood work) drawn today and your tests are completely normal, you will receive your results only by: MyChart Message (if you have MyChart) OR A paper copy in the mail If you have any lab test that is abnormal or we need to change your treatment, we will call you to review the results.   Testing/Procedures:  -None   Follow-Up: At [facility], you and your health needs are our priority.  As part of our continuing mission to provide you with exceptional heart care, we have created designated Provider Care Teams.  These Care Teams include your primary Cardiologist (physician) and Advanced Practice Providers (APPs -  Physician Assistants and Nurse Practitioners) who all work together to provide you with the care you need, when you need it.  We recommend signing up for the patient portal called "MyChart".  Sign up information is provided on this After Visit Summary.  MyChart is used to connect with patients for Virtual Visits (Telemedicine).  Patients are able to view lab/test results, encounter notes, upcoming appointments, etc.  Non-urgent messages can be sent to your provider as well.   To learn more about what you can do with MyChart, go to [email or web address].    Your next appointment:   6 week(s) on Monday, July 25 @ 11:40 am  The format for your next appointment:   In Person  Provider:   Laurance Flatten, MD   Other Instructions  Low-Sodium Eating Plan Sodium, which is an element that makes up salt, helps you maintain a healthy balance of fluids in your body. Too much sodium can increase your bloodpressure and cause fluid and waste to be held in your  body. Your health care provider or dietitian may recommend following this plan if you have high blood pressure (hypertension), kidney disease, liver disease, or heart failure. Eating less sodium can help lower your blood pressure, reduce swelling, and protect your heart, liver, andkidneys. What are tips for following this plan? Reading food labels The Nutrition Facts label lists the amount of sodium in one serving of the food. If you eat more than one serving, you must multiply the listed amount of sodium by the number of servings. Choose foods with less than 140 mg of sodium per serving. Avoid foods with 300 mg of sodium or more per serving. Shopping  Look for lower-sodium products, often labeled as "low-sodium" or "no salt added." Always check the sodium content, even if foods are labeled as "unsalted" or "no salt added." Buy fresh foods. Avoid canned foods and pre-made or frozen meals. Avoid canned, cured, or processed meats. Buy breads that have less than 80 mg of sodium per slice.  Cooking  Eat more home-cooked food and less restaurant, buffet, and fast food. Avoid adding salt when cooking. Use salt-free seasonings or herbs instead of table salt or sea salt. Check with your health care provider or pharmacist before using salt substitutes. Cook with plant-based oils, such as canola, sunflower, or olive oil.  Meal planning When eating at a restaurant, ask that your food be prepared with less salt or no salt, if possible. Avoid dishes labeled as brined, pickled, cured, smoked, or made with soy sauce, miso, or  teriyaki sauce. Avoid foods that contain MSG (monosodium glutamate). MSG is sometimes added to [place] food, bouillon, and some canned foods. Make meals that can be grilled, baked, poached, roasted, or steamed. These are generally made with less sodium. General information Most people on this plan should limit their sodium intake to 1,500-2,000 mg (milligrams) of sodium each  day. What foods should I eat? Fruits Fresh, frozen, or canned fruit. Fruit juice. Vegetables Fresh or frozen vegetables. "No salt added" canned vegetables. "No salt added"tomato sauce and paste. Low-sodium or reduced-sodium tomato and vegetable juice. Grains Low-sodium cereals, including oats, puffed wheat and rice, and shredded wheat. Low-sodium crackers. Unsalted rice. Unsalted pasta. Low-sodium bread.Whole-grain breads and whole-grain pasta. Meats and other proteins Fresh or frozen (no salt added) meat, poultry, seafood, and fish. Low-sodium canned tuna and salmon. Unsalted nuts. Dried peas, beans, and lentils withoutadded salt. Unsalted canned beans. Eggs. Unsalted nut butters. Dairy Milk. Soy milk. Cheese that is naturally low in sodium, such as ricotta cheese, fresh mozzarella, or Swiss cheese. Low-sodium or reduced-sodium cheese. Creamcheese. Yogurt. Seasonings and condiments Fresh and dried herbs and spices. Salt-free seasonings. Low-sodium mustard and ketchup. Sodium-free salad dressing. Sodium-free light mayonnaise. Fresh orrefrigerated horseradish. Lemon juice. Vinegar. Other foods Homemade, reduced-sodium, or low-sodium soups. Unsalted popcorn and pretzels.Low-salt or salt-free chips. The items listed above may not be a complete list of foods and beverages you can eat. Contact a dietitian for more information. What foods should I avoid? Vegetables Sauerkraut, pickled vegetables, and relishes. Olives. [place] fries. Onion rings. Regular canned vegetables (not low-sodium or reduced-sodium). Regular canned tomato sauce and paste (not low-sodium or reduced-sodium). Regular tomato and vegetable juice (not low-sodium or reduced-sodium). Frozenvegetables in sauces. Grains Instant hot cereals. Bread stuffing, pancake, and biscuit mixes. Croutons. Seasoned rice or pasta mixes. Noodle soup cups. Boxed or frozen macaroni andcheese. Regular salted crackers. Self-rising flour. Meats and other  proteins Meat or fish that is salted, canned, smoked, spiced, or pickled. Precooked or cured meat, such as sausages or meat loaves. Tomasa Blase. Ham. Pepperoni. Hot dogs. Corned beef. Chipped beef. Salt pork. Jerky. Pickled herring. Anchovies andsardines. Regular canned tuna. Salted nuts. Dairy Processed cheese and cheese spreads. Hard cheeses. Cheese curds. Blue cheese.Feta cheese. String cheese. Regular cottage cheese. Buttermilk. Canned milk. Fats and oils Salted butter. Regular margarine. Ghee. Bacon fat. Seasonings and condiments Onion salt, garlic salt, seasoned salt, table salt, and sea salt. Canned and packaged gravies. Worcestershire sauce. Tartar sauce. Barbecue sauce. Teriyaki sauce. Soy sauce, including reduced-sodium. Steak sauce. Fish sauce. Oyster sauce. Cocktail sauce. Horseradish that you find on the shelf. Regular ketchup and mustard. Meat flavorings and tenderizers. Bouillon cubes. Hot sauce. Pre-made or packaged marinades. Pre-made or packaged taco seasonings. Relishes.Regular salad dressings. Salsa. Other foods Salted popcorn and pretzels. Corn chips and puffs. Potato and tortilla chips.Canned or dried soups. Pizza. Frozen entrees and pot pies. The items listed above may not be a complete list of foods and beverages you should avoid. Contact a dietitian for more information. Summary Eating less sodium can help lower your blood pressure, reduce swelling, and protect your heart, liver, and kidneys. Most people on this plan should limit their sodium intake to 1,500-2,000 mg (milligrams) of sodium each day. Canned, boxed, and frozen foods are high in sodium. Restaurant foods, fast foods, and pizza are also very high in sodium. You also get sodium by adding salt to food. Try to cook at home, eat more fresh fruits and vegetables, and eat less fast food and canned, processed,  or prepared foods. This information is not intended to replace advice given to you by your health care provider. Make  sure you discuss any questions you have with your healthcare provider. Document Revised: 07/30/2019 Document Reviewed: 05/26/2019 Elsevier Patient Education  2022 [employer]   If you blood pressure in 140/90 consistently X 3 call office or send mychart message please.   If you gain 3 lbs in 24 hours or 5 lbs in one week please take one extra lasix X 3 days.    Sent a message to Card[facility]hab to check on your status, you should be hearing from someone.

## 2021-01-01 ENCOUNTER — Ambulatory Visit: Payer: No Typology Code available for payment source | Admitting: Cardiology

## 2021-01-12 ENCOUNTER — Telehealth (HOSPITAL_COMMUNITY): Payer: Self-pay

## 2021-01-16 ENCOUNTER — Other Ambulatory Visit: Payer: Self-pay

## 2021-01-16 ENCOUNTER — Encounter (HOSPITAL_COMMUNITY)
Admission: RE | Admit: 2021-01-16 | Discharge: 2021-01-16 | Disposition: A | Discharge status: Home / Self Care | Payer: No Typology Code available for payment source | Source: Ambulatory Visit | Attending: Cardiology | Admitting: Cardiology

## 2021-01-16 VITALS — BP 144/62 | HR 83 | Ht 69.0 in | Wt 254.4 lb

## 2021-01-16 DIAGNOSIS — Z952 Presence of prosthetic heart valve: Secondary | ICD-10-CM | POA: Diagnosis present

## 2021-01-16 DIAGNOSIS — Z951 Presence of aortocoronary bypass graft: Secondary | ICD-10-CM | POA: Diagnosis not present

## 2021-01-16 LAB — GLUCOSE, CAPILLARY: Glucose-Capillary: 135 mg/dL — ABNORMAL HIGH (ref 70–99)

## 2021-01-16 NOTE — Progress Notes (Signed)
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 Swallow whole., Disp: 30 tablet, Rfl: 11   atorvastatin (LIPITOR) 40 MG tablet, Take 40 mg by mouth at bedtime. Reported on 01/11/2016, Disp: , Rfl:    Blood Glucose Monitoring Suppl (ONETOUCH VERIO FLEX SYSTEM) W/DEVICE KIT, 1 each by Does not apply route daily. Dx: E11.9, Disp: 1 kit, Rfl: 0   carvedilol (COREG) 12.5 MG tablet, Take 1 tablet (12.5 mg total) by mouth 2 (two) times daily with a meal., Disp: 60 tablet, Rfl: 1   cholecalciferol (VITAMIN D3) 25 MCG (1000 UNIT) tablet, Take 1,000 Units by mouth daily., Disp: , Rfl:    Cyanocobalamin (VITAMIN B12) 500 MCG TABS, Take 500 mcg by mouth 2 (two) times daily., Disp: , Rfl:    ferrous sulfate 325 (65 FE) MG tablet, Take 325 mg by mouth 3 (three) times a week., Disp: , Rfl:    furosemide (LASIX) 20 MG tablet, Take 1 tablet (20 mg total) by mouth daily. (Patient taking differently: Take 20 mg by  mouth daily. Pt advised to take 64m when his weight is elevated), Disp: 90 tablet, Rfl: 0   glipiZIDE (GLUCOTROL) 10 MG tablet, Take 10 mg by mouth 2 (two) times daily before a meal., Disp: , Rfl:    glucose blood (ONETOUCH VERIO) test strip, Use to test blood sugar 2-3 times daily as instructed. Dx: E11.9, Disp: 200 each, Rfl: 3   HYDROcodone-acetaminophen (NORCO/VICODIN) 5-325 MG tablet, Take 1 tablet by mouth every 6 (six) hours as needed (for pain)., Disp: , Rfl:    losartan (COZAAR) 50 MG tablet, Take 1 tablet (50 mg total) by mouth daily., Disp: 60 tablet, Rfl: 11   metFORMIN (GLUCOPHAGE) 500 MG tablet, Take 1,000 mg by mouth 2 (two) times daily with a meal., Disp: , Rfl:    Multiple Vitamins-Minerals (CENTRUM SILVER 50+MEN) TABS, Take 1 tablet by mouth daily with breakfast., Disp: , Rfl:    Multiple Vitamins-Minerals (PRESERVISION AREDS PO), Take 1 capsule by mouth 2 (two) times daily., Disp: , Rfl:    nitroGLYCERIN (NITROSTAT) 0.4 MG SL tablet, Place 0.4 mg under the tongue every 5 (five) minutes as needed., Disp: , Rfl:    omeprazole (PRILOSEC) 20 MG capsule, Take 20 mg by mouth daily before breakfast., Disp: , Rfl:    ONETOUCH DELICA LANCETS FINE MISC, Use to test blood sugar 2-3 times daily as instructed. Dx: E11.9,  Disp: 200 each, Rfl: 3   Pseudoephedrine-guaiFENesin (MUCINEX D PO), Take 1 tablet by mouth every 12 (twelve) hours., Disp: , Rfl:    VENTOLIN HFA 108 (90 Base) MCG/ACT inhaler, Inhale 1-2 puffs into the lungs every 4 (four) hours as needed for wheezing or shortness of breath. , Disp: , Rfl:    levothyroxine (SYNTHROID) 200 MCG tablet, Take 1 tablet (200 mcg total) by mouth daily., Disp: 30 tablet, Rfl: 0  Past Medical History: Past Medical History:  Diagnosis Date   Aortic stenosis    09/30/19 echo ([facility], Cardiologist Dr. 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/19/2013)   Meningoencephalitis    Obesity    OSA on CPAP    last sleep study >8 yrs   Pneumonia    "twice" (05/19/2013)   Shortness of breath    Stroke [facility])    "they said I had a minor one when I had menigitis" (05/19/2013)   Thyroid disease    Tobacco abuse    quit 11/08/2012   Type II diabetes mellitus ([place])    Umbilical hernia     Tobacco Use: Social History   Tobacco Use  Smoking Status Every Day   Packs/day: 0.50   Years: 50.00   Pack years: 25.00   Types: Cigarettes  Smokeless Tobacco Never    Labs: Recent Review Flowsheet Data     Labs for ITP Cardiac and Pulmonary Rehab Latest Ref Rng & Units 09/11/2020 09/11/2020 09/11/2020 09/11/2020 11/05/2020   Cholestrol 0 - 200 mg/dL - - - - -   LDLCALC 0 - 99 mg/dL - - - - -   HDL >39.00 mg/dL - - - - -   Trlycerides 0.0 - 149.0 mg/dL - - - - -   Hemoglobin A1c 4.8 - 5.6 % - - - - -   PHART 7.350 - 7.450 7.381 7.422 7.428 7.438 -   PCO2ART 32.0 - 48.0 mmHg 40.1 35.1 34.1 34.0 -   HCO3 20.0 - 28.0 mmol/L 23.8 22.9 22.6 23.1 -   TCO2 22 - 32 mmol/L _0 ACIDBASEDEF 0.0 - 2.0 mmol/L 1.0 1.0 2.0 1.0 -   O2SAT % 93.0 94.0 94.0 96.0 -       Capillary Blood Glucose: Lab Results  Component Value Date   GLUCAP 135 (H) 01/16/2021   GLUCAP 124 (H) 11/16/2020   GLUCAP 102 (H) 11/16/2020   GLUCAP 157 (H) 11/15/2020   GLUCAP 187 (H) 11/15/2020     Exercise Target Goals: Exercise Program Goal: Individual exercise prescription set using results from initial 6 min walk test and  THRR while considering  patient's activity barriers and safety.   Exercise Prescription Goal: Initial exercise prescription builds to 30-45 minutes a day of aerobic activity, 2-3 days per week.  Home exercise guidelines will be given to patient during program as part of exercise prescription  that the participant will acknowledge.  Activity Barriers & Risk Stratification:  Activity Barriers & Cardiac Risk Stratification - 01/16/21 1533       Activity Barriers & Cardiac Risk Stratification   Activity Barriers Left Knee Replacement;Right Knee Replacement;History of Falls;Deconditioning    Cardiac Risk Stratification High             6 Minute Walk:  6 Minute Walk     Row Name 01/16/21 1342         6 Minute Walk   Phase Initial     Distance 800 feet     Walk Time 6 minutes     # of Rest Breaks 1     MPH 1.51     METS 1.5     RPE 11     Perceived Dyspnea  0     VO2 Peak 5.24     Symptoms Yes (comment)     Comments Patient c/o fatigue, seated rest break x1 for 1 minute, 19 seconds taken.     Resting HR 83 bpm     Resting BP 144/62     Resting Oxygen Saturation  98 %     Exercise Oxygen Saturation  during 6 min walk 100 %     Max Ex. HR 104 bpm     Max Ex. BP 168/64     2 Minute Post BP 148/70              Oxygen Initial Assessment:   Oxygen Re-Evaluation:   Oxygen Discharge (Final Oxygen Re-Evaluation):   Initial Exercise Prescription:  Initial Exercise Prescription - 01/16/21 1500       Date of Initial Exercise RX and Referring Provider   Date 01/16/21    Referring Provider Freada Bergeron, MD    Expected Discharge Date 03/16/21      NuStep   Level 2    SPM 85    Minutes 15    METs 1.8      Arm Ergometer   Level 1.5    Watts 20    Minutes 15    METs 1.9      Prescription Details   Frequency (times per week) 3    Duration Progress to 30 minutes of continuous aerobic without signs/symptoms of physical distress      Intensity   THRR  40-80% of Max Heartrate 58-116    Ratings of Perceived Exertion 11-13    Perceived Dyspnea 0-4      Progression   Progression Continue to progress workloads to maintain intensity without signs/symptoms of physical distress.      Resistance Training   Training Prescription Yes    Weight 3 lbs    Reps 10-15             Perform Capillary Blood Glucose checks as needed.  Exercise Prescription Changes:   Exercise Comments:   Exercise Goals and Review:   Exercise Goals     Row Name 01/16/21 1308             Exercise Goals   Increase Physical Activity Yes       Intervention Provide advice, education, support and counseling about physical activity/exercise needs.;Develop an individualized exercise prescription for aerobic and resistive training based on initial evaluation findings, risk stratification, comorbidities and participant's personal goals.       Expected Outcomes Short Term: Attend rehab on a regular basis to increase amount of physical activity.;Long Term: Exercising regularly at least 3-5 days a  week.;Long Term: Add in home exercise to make exercise part of routine and to increase amount of physical activity.       Increase Strength and Stamina Yes       Intervention Provide advice, education, support and counseling about physical activity/exercise needs.;Develop an individualized exercise prescription for aerobic and resistive training based on initial evaluation findings, risk stratification, comorbidities and participant's personal goals.       Expected Outcomes Short Term: Increase workloads from initial exercise prescription for resistance, speed, and METs.;Short Term: Perform resistance training exercises routinely during rehab and add in resistance training at home;Long Term: Improve cardiorespiratory fitness, muscular endurance and strength as measured by increased METs and functional capacity (6MWT)       Able to understand and use rate of perceived exertion  (RPE) scale Yes       Intervention Provide education and explanation on how to use RPE scale       Expected Outcomes Short Term: Able to use RPE daily in rehab to express subjective intensity level;Long Term:  Able to use RPE to guide intensity level when exercising independently       Knowledge and understanding of Target Heart Rate Range (THRR) Yes       Intervention Provide education and explanation of THRR including how the numbers were predicted and where they are located for reference       Expected Outcomes Short Term: Able to state/look up THRR;Long Term: Able to use THRR to govern intensity when exercising independently;Short Term: Able to use daily as guideline for intensity in rehab       Understanding of Exercise Prescription Yes       Intervention Provide education, explanation, and written materials on patient's individual exercise prescription       Expected Outcomes Short Term: Able to explain program exercise prescription;Long Term: Able to explain home exercise prescription to exercise independently                Exercise Goals Re-Evaluation :   Discharge Exercise Prescription (Final Exercise Prescription Changes):   Nutrition:  Target Goals: Understanding of nutrition guidelines, daily intake of sodium <1575m, cholesterol <2088m calories 30% from fat and 7% or less from saturated fats, daily to have 5 or more servings of fruits and vegetables.  Biometrics:  Pre Biometrics - 01/16/21 1307       Pre Biometrics   Waist Circumference 44.5 inches    Hip Circumference 45 inches    Waist to Hip Ratio 0.99 %    Triceps Skinfold 25 mm    % Body Fat 35.6 %    Grip Strength 34.5 kg    Flexibility --   N/A, bilateral knee replacement.   Single Leg Stand 4 seconds              Nutrition Therapy Plan and Nutrition Goals:   Nutrition Assessments:  MEDIFICTS Score Key: ?70 Need to make dietary changes  40-70 Heart Healthy Diet ? 40 Therapeutic Level  Cholesterol Diet    Picture Your Plate Scores: <4<19nhealthy dietary pattern with much room for improvement. 41-50 Dietary pattern unlikely to meet recommendations for good health and room for improvement. 51-60 More healthful dietary pattern, with some room for improvement.  >60 Healthy dietary pattern, although there may be some specific behaviors that could be improved.    Nutrition Goals Re-Evaluation:   Nutrition Goals Re-Evaluation:   Nutrition Goals Discharge (Final Nutrition Goals Re-Evaluation):   Psychosocial: Target Goals: Acknowledge presence or  absence of significant depression and/or stress, maximize coping skills, provide positive support system. Participant is able to verbalize types and ability to use techniques and skills needed for reducing stress and depression.  Initial Review & Psychosocial Screening:  Initial Psych Review & Screening - 01/16/21 1443       Initial Review   Current issues with None Identified      Family Dynamics   Good Support System? Yes   wife accompained Elenore Rota     Barriers   Psychosocial barriers to participate in program There are no identifiable barriers or psychosocial needs.      Screening Interventions   Interventions Encouraged to exercise    Expected Outcomes Short Term goal: Identification and review with participant of any Quality of Life or Depression concerns found by scoring the questionnaire.;Long Term goal: The participant improves quality of Life and PHQ9 Scores as seen by post scores and/or verbalization of changes             Quality of Life Scores:  Quality of Life - 01/16/21 1452       Quality of Life   Select Quality of Life      Quality of Life Scores   Health/Function Pre 25.21 %    Socioeconomic Pre 28.21 %    Psych/Spiritual Pre 28.29 %    Family Pre 30 %    GLOBAL Pre 27.23 %            Scores of 19 and below usually indicate a poorer quality of life in these areas.  A difference of   2-3 points is a clinically meaningful difference.  A difference of 2-3 points in the total score of the Quality of Life Index has been associated with significant improvement in overall quality of life, self-image, physical symptoms, and general health in studies assessing change in quality of life.  PHQ-9: Recent Review Flowsheet Data     Depression screen [facility] 2/9 04/20/2013   Decreased Interest 0   Down, Depressed, Hopeless 0   PHQ - 2 Score 0      Interpretation of Total Score  Total Score Depression Severity:  1-4 = Minimal depression, 5-9 = Mild depression, 10-14 = Moderate depression, 15-19 = Moderately severe depression, 20-27 = Severe depression   Psychosocial Evaluation and Intervention:   Psychosocial Re-Evaluation:   Psychosocial Discharge (Final Psychosocial Re-Evaluation):   Vocational Rehabilitation: Provide vocational rehab assistance to qualifying candidates.   Vocational Rehab Evaluation & Intervention:  Vocational Rehab - 01/16/21 1444       Initial Vocational Rehab Evaluation & Intervention   Assessment shows need for Vocational Rehabilitation No             Education: Education Goals: Education classes will be provided on a weekly basis, covering required topics. Participant will state understanding/return demonstration of topics presented.  Learning Barriers/Preferences:  Learning Barriers/Preferences - 01/16/21 1444       Learning Barriers/Preferences   Learning Barriers Sight    Learning Preferences Audio;Group Instruction;Individual Instruction;Written Material             Education Topics: Count Your Pulse:  -Group instruction provided by verbal instruction, demonstration, patient participation and written materials to support subject.  Instructors address importance of being able to find your pulse and how to count your pulse when at home without a heart monitor.  Patients get hands on experience counting their pulse with staff  help and individually.   Heart Attack, Angina, and Risk Factor Modification:  -  Group instruction provided by verbal instruction, video, and written materials to support subject.  Instructors address signs and symptoms of angina and heart attacks.    Also discuss risk factors for heart disease and how to make changes to improve heart health risk factors.   Functional Fitness:  -Group instruction provided by verbal instruction, demonstration, patient participation, and written materials to support subject.  Instructors address safety measures for doing things around the house.  Discuss how to get up and down off the floor, how to pick things up properly, how to safely get out of a chair without assistance, and balance training.   Meditation and Mindfulness:  -Group instruction provided by verbal instruction, patient participation, and written materials to support subject.  Instructor addresses importance of mindfulness and meditation practice to help reduce stress and improve awareness.  Instructor also leads participants through a meditation exercise.    Stretching for Flexibility and Mobility:  -Group instruction provided by verbal instruction, patient participation, and written materials to support subject.  Instructors lead participants through series of stretches that are designed to increase flexibility thus improving mobility.  These stretches are additional exercise for major muscle groups that are typically performed during regular warm up and cool down.   Hands Only CPR:  -Group verbal, video, and participation provides a basic overview of AHA guidelines for community CPR. Role-play of emergencies allow participants the opportunity to practice calling for help and chest compression technique with discussion of AED use.   Hypertension: -Group verbal and written instruction that provides a basic overview of hypertension including the most recent diagnostic guidelines, risk factor  reduction with self-care instructions and medication management.    Nutrition I class: Heart Healthy Eating:  -Group instruction provided by PowerPoint slides, verbal discussion, and written materials to support subject matter. The instructor gives an explanation and review of the Therapeutic Lifestyle Changes diet recommendations, which includes a discussion on lipid goals, dietary fat, sodium, fiber, plant stanol/sterol esters, sugar, and the components of a well-balanced, healthy diet.   Nutrition II class: Lifestyle Skills:  -Group instruction provided by PowerPoint slides, verbal discussion, and written materials to support subject matter. The instructor gives an explanation and review of label reading, grocery shopping for heart health, heart healthy recipe modifications, and ways to make healthier choices when eating out.   Diabetes Question & Answer:  -Group instruction provided by PowerPoint slides, verbal discussion, and written materials to support subject matter. The instructor gives an explanation and review of diabetes co-morbidities, pre- and post-prandial blood glucose goals, pre-exercise blood glucose goals, signs, symptoms, and treatment of hypoglycemia and hyperglycemia, and foot care basics.   Diabetes Blitz:  -Group instruction provided by PowerPoint slides, verbal discussion, and written materials to support subject matter. The instructor gives an explanation and review of the physiology behind type 1 and type 2 diabetes, diabetes medications and rational behind using different medications, pre- and post-prandial blood glucose recommendations and Hemoglobin A1c goals, diabetes diet, and exercise including blood glucose guidelines for exercising safely.    Portion Distortion:  -Group instruction provided by PowerPoint slides, verbal discussion, written materials, and food models to support subject matter. The instructor gives an explanation of serving size versus portion  size, changes in portions sizes over the last 20 years, and what consists of a serving from each food group.   Stress Management:  -Group instruction provided by verbal instruction, video, and written materials to support subject matter.  Instructors review role of stress in heart disease  and how to cope with stress positively.     Exercising on Your Own:  -Group instruction provided by verbal instruction, power point, and written materials to support subject.  Instructors discuss benefits of exercise, components of exercise, frequency and intensity of exercise, and end points for exercise.  Also discuss use of nitroglycerin and activating EMS.  Review options of places to exercise outside of rehab.  Review guidelines for sex with heart disease.   Cardiac Drugs I:  -Group instruction provided by verbal instruction and written materials to support subject.  Instructor reviews cardiac drug classes: antiplatelets, anticoagulants, beta blockers, and statins.  Instructor discusses reasons, side effects, and lifestyle considerations for each drug class.   Cardiac Drugs II:  -Group instruction provided by verbal instruction and written materials to support subject.  Instructor reviews cardiac drug classes: angiotensin converting enzyme inhibitors (ACE-I), angiotensin II receptor blockers (ARBs), nitrates, and calcium channel blockers.  Instructor discusses reasons, side effects, and lifestyle considerations for each drug class.   Anatomy and Physiology of the Circulatory System:  Group verbal and written instruction and models provide basic cardiac anatomy and physiology, with the coronary electrical and arterial systems. Review of: AMI, Angina, Valve disease, Heart Failure, Peripheral Artery Disease, Cardiac Arrhythmia, Pacemakers, and the ICD.   Other Education:  -Group or individual verbal, written, or video instructions that support the educational goals of the cardiac rehab  program.   Holiday Eating Survival Tips:  -Group instruction provided by PowerPoint slides, verbal discussion, and written materials to support subject matter. The instructor gives patients tips, tricks, and techniques to help them not only survive but enjoy the holidays despite the onslaught of food that accompanies the holidays.   Knowledge Questionnaire Score:  Knowledge Questionnaire Score - 01/16/21 1453       Knowledge Questionnaire Score   Pre Score 23/28             Core Components/Risk Factors/Patient Goals at Admission:  Personal Goals and Risk Factors at Admission - 01/16/21 1329       Core Components/Risk Factors/Patient Goals on Admission    Weight Management Yes;Obesity    Intervention Weight Management/Obesity: Establish reasonable short term and long term weight goals.;Obesity: Provide education and appropriate resources to help participant work on and attain dietary goals.    Admit Weight 254 lb 6.6 oz (115.4 kg)    Goal Weight: Short Term 244 lb (110.7 kg)    Goal Weight: Long Term 230 lb (104.3 kg)    Expected Outcomes Short Term: Continue to assess and modify interventions until short term weight is achieved;Long Term: Adherence to nutrition and physical activity/exercise program aimed toward attainment of established weight goal;Weight Loss: Understanding of general recommendations for a balanced deficit meal plan, which promotes 1-2 lb weight loss per week and includes a negative energy balance of [phone] kcal/d;Understanding recommendations for meals to include 15-35% energy as protein, 25-35% energy from fat, 35-60% energy from carbohydrates, less than 228m of dietary cholesterol, 20-35 gm of total fiber daily;Understanding of distribution of calorie intake throughout the day with the consumption of 4-5 meals/snacks    Diabetes Yes    Intervention Provide education about signs/symptoms and action to take for hypo/hyperglycemia.;Provide education about proper  nutrition, including hydration, and aerobic/resistive exercise prescription along with prescribed medications to achieve blood glucose in normal ranges: Fasting glucose 65-99 mg/dL    Expected Outcomes Short Term: Participant verbalizes understanding of the signs/symptoms and immediate care of hyper/hypoglycemia, proper foot care and importance of  medication, aerobic/resistive exercise and nutrition plan for blood glucose control.;Long Term: Attainment of HbA1C < 7%.    Heart Failure Yes    Intervention Provide a combined exercise and nutrition program that is supplemented with education, support and counseling about heart failure. Directed toward relieving symptoms such as shortness of breath, decreased exercise tolerance, and extremity edema.    Expected Outcomes Improve functional capacity of life;Short term: Attendance in program 2-3 days a week with increased exercise capacity. Reported lower sodium intake. Reported increased fruit and vegetable intake. Reports medication compliance.;Short term: Daily weights obtained and reported for increase. Utilizing diuretic protocols set by physician.;Long term: Adoption of self-care skills and reduction of barriers for early signs and symptoms recognition and intervention leading to self-care maintenance.    Hypertension Yes    Intervention Provide education on lifestyle modifcations including regular physical activity/exercise, weight management, moderate sodium restriction and increased consumption of fresh fruit, vegetables, and low fat dairy, alcohol moderation, and smoking cessation.;Monitor prescription use compliance.    Expected Outcomes Short Term: Continued assessment and intervention until BP is < 140/108m HG in hypertensive participants. < 130/881mHG in hypertensive participants with diabetes, heart failure or chronic kidney disease.;Long Term: Maintenance of blood pressure at goal levels.    Lipids Yes    Intervention Provide education and support  for participant on nutrition & aerobic/resistive exercise along with prescribed medications to achieve LDL <70100mHDL >108m82m  Expected Outcomes Short Term: Participant states understanding of desired cholesterol values and is compliant with medications prescribed. Participant is following exercise prescription and nutrition guidelines.;Long Term: Cholesterol controlled with medications as prescribed, with individualized exercise RX and with personalized nutrition plan. Value goals: LDL < 70mg71mL > 40 mg.             Core Components/Risk Factors/Patient Goals Review:    Core Components/Risk Factors/Patient Goals at Discharge (Final Review):    ITP Comments:  ITP Comments     Row Name 01/16/21 1308           ITP Comments Medical Director- Dr. TraciFransico Him               Comments: Patient attended orientation on 01/16/2021 to review rules and guidelines for the cardiac rehab program. Completed 6-minute walk test and measurements, initial ITP, and exercise prescription. Vital signs stable. Telemetry- atrial fibrillation with frequent PVCs, rare couplets and triplet. Patient's cardiologist contacted regarding ectopy, EKG rhythm strips faxed to office. Patient c/o fatigue during walk test but otherwise asymptomatic. Safety measures and social distancing in place per CDC guidelines.   OlintSol Passer ACSM CEP 01/16/2021 1559

## 2021-01-16 NOTE — Progress Notes (Signed)
Cardiac Rehab Medication Review   Does the patient  feel that his/her medications are working for him/her?  yes  Has the patient been experiencing any side effects to the medications prescribed?  no  Does the patient measure his/her own blood pressure or blood glucose at home?  yes   Does the patient have any problems obtaining medications due to transportation or finances?   no  Understanding of regimen: excellent Understanding of indications: excellent Potential of compliance: excellent    Comments: Mason Patton is accompanied by his wife who is well versed with his medications and how they are used.  [name] reports no issues with obtaining his medications through the [place] and if medication is needed urgently he uses [facility].    [name] 01/16/2021 2:46 PM

## 2021-01-17 ENCOUNTER — Telehealth (HOSPITAL_COMMUNITY): Payer: Self-pay | Admitting: *Deleted

## 2021-01-17 ENCOUNTER — Telehealth: Payer: Self-pay | Admitting: *Deleted

## 2021-01-17 DIAGNOSIS — Z794 Long term (current) use of insulin: Secondary | ICD-10-CM

## 2021-01-17 DIAGNOSIS — Z951 Presence of aortocoronary bypass graft: Secondary | ICD-10-CM

## 2021-01-17 DIAGNOSIS — I10 Essential (primary) hypertension: Secondary | ICD-10-CM

## 2021-01-17 DIAGNOSIS — I5032 Chronic diastolic (congestive) heart failure: Secondary | ICD-10-CM

## 2021-01-17 DIAGNOSIS — I25810 Atherosclerosis of coronary artery bypass graft(s) without angina pectoris: Secondary | ICD-10-CM

## 2021-01-17 DIAGNOSIS — Z79899 Other long term (current) drug therapy: Secondary | ICD-10-CM

## 2021-01-17 DIAGNOSIS — I5042 Chronic combined systolic (congestive) and diastolic (congestive) heart failure: Secondary | ICD-10-CM

## 2021-01-17 DIAGNOSIS — E1159 Type 2 diabetes mellitus with other circulatory complications: Secondary | ICD-10-CM

## 2021-01-17 NOTE — Telephone Encounter (Signed)
-----   Message from [name], MD sent at 01/17/2021  7:17 AM EDT ----- Thank you so much! I will take a look. We will have him come for repeat labs and see if he needs some supplementation. ----- Message ----- From: [name], RN Sent: 01/16/2021   3:28 PM EDT To: [name], MD  Dr. Shari Prows,  The above patient in today for cardiac rehab orientation.  Noted on his ekg strips frequent PVCs, Couplets and rare triplet.   Jeancarlo was asymptomatic and was unaware of the ectopy.  Last echo showed EF 60-65%. Underlying history of afib.  I am sending over the strips for you to review.  Noteworthy last lab work for K+ was 3.2 and magnesium 1.6 these were obtained while he was in the hospital with staph infection. Pt weight quite elevated from his previous weight on 6/21- 244 today he is 253.  Started the increased dose for Lasix as instructed for weight gain.  Admits to dietary indiscretion with sodium and increased appetite. I am sending the strips over via fax for you to review. Thanks Analisse Randle

## 2021-01-17 NOTE — Telephone Encounter (Signed)
-----   Message from [name], MD sent at 01/17/2021  7:18 AM EDT ----- Can we get him a lab appointment for BMET and BNP?  Thank you so much!! ----- Message ----- From: [name], RN Sent: 01/16/2021   3:28 PM EDT To: [name], MD  Dr. Shari Prows,  The above patient in today for cardiac rehab orientation.  Noted on his ekg strips frequent PVCs, Couplets and rare triplet.   Mason Patton was asymptomatic and was unaware of the ectopy.  Last echo showed EF 60-65%. Underlying history of afib.  I am sending over the strips for you to review.  Noteworthy last lab work for K+ was 3.2 and magnesium 1.6 these were obtained while he was in the hospital with staph infection. Pt weight quite elevated from his previous weight on 6/21- 244 today he is 253.  Started the increased dose for Lasix as instructed for weight gain.  Admits to dietary indiscretion with sodium and increased appetite. I am sending the strips over via fax for you to review. Thanks Carlette

## 2021-01-17 NOTE — Telephone Encounter (Signed)
Spoke with the pts wife Dois Davenport (on [place]) and endorsed to her that Dr. Shari Prows would like for the pt to come into the office to have labs done sometime this week, and we will see him as planned in clinic on 7/25.  Lab appt scheduled for the pt for tomorrow 7/14.  We will check BMET and PRO-BNP at that time.  Wife verbalized understanding and agrees with this plan.

## 2021-01-18 ENCOUNTER — Other Ambulatory Visit: Payer: Medicare Other

## 2021-01-18 ENCOUNTER — Other Ambulatory Visit: Payer: Self-pay

## 2021-01-18 DIAGNOSIS — I25810 Atherosclerosis of coronary artery bypass graft(s) without angina pectoris: Secondary | ICD-10-CM

## 2021-01-18 DIAGNOSIS — I5042 Chronic combined systolic (congestive) and diastolic (congestive) heart failure: Secondary | ICD-10-CM

## 2021-01-18 DIAGNOSIS — Z794 Long term (current) use of insulin: Secondary | ICD-10-CM

## 2021-01-18 DIAGNOSIS — E1159 Type 2 diabetes mellitus with other circulatory complications: Secondary | ICD-10-CM

## 2021-01-18 DIAGNOSIS — Z79899 Other long term (current) drug therapy: Secondary | ICD-10-CM

## 2021-01-18 DIAGNOSIS — I10 Essential (primary) hypertension: Secondary | ICD-10-CM

## 2021-01-18 DIAGNOSIS — I5032 Chronic diastolic (congestive) heart failure: Secondary | ICD-10-CM

## 2021-01-18 DIAGNOSIS — Z951 Presence of aortocoronary bypass graft: Secondary | ICD-10-CM

## 2021-01-18 LAB — BASIC METABOLIC PANEL
BUN/Creatinine Ratio: 15 (ref 10–24)
BUN: 12 mg/dL (ref 8–27)
CO2: 26 mmol/L (ref 20–29)
Calcium: 8.7 mg/dL (ref 8.6–10.2)
Chloride: 102 mmol/L (ref 96–106)
Creatinine, Ser: 0.79 mg/dL (ref 0.76–1.27)
Glucose: 156 mg/dL — ABNORMAL HIGH (ref 65–99)
Potassium: 4.1 mmol/L (ref 3.5–5.2)
Sodium: 143 mmol/L (ref 134–144)
eGFR: 93 mL/min/{1.73_m2} (ref >59)

## 2021-01-18 LAB — PRO B NATRIURETIC PEPTIDE: NT-Pro BNP: 3251 pg/mL — ABNORMAL HIGH (ref 0–486)

## 2021-01-19 ENCOUNTER — Telehealth (HOSPITAL_COMMUNITY): Payer: Self-pay | Admitting: *Deleted

## 2021-01-19 MED ORDER — FUROSEMIDE 40 MG PO TABS: 40.0000 mg | ORAL_TABLET | Freq: Every day | ORAL | 0 refills | Status: DC

## 2021-01-19 NOTE — Telephone Encounter (Signed)
-----   Message from [name], MD sent at 01/18/2021  5:18 PM EDT ----- His fluid levels are up again. Can we increase his lasix to 40mg  daily until he is seen by in clinic. He needs to avoid the salty foods as much as possible because this is leading to worsening of his fluid status.

## 2021-01-19 NOTE — Telephone Encounter (Signed)
-----   Message from [name], MD sent at 01/19/2021  9:07 AM EDT ----- [name],  Love this man but he always cheats on his diet and loads up on salt. We increased his lasix so there is no reason he cannot exercise or participate in cardiac rehab. I saw his ECG and he is having frequent PVCs which is probably due to being a little volume up. He has had this during our visits as well. Again, he can exercise with this as long as he feels okay (he never felt them here either).  Thanks for checking! You will really enjoy him, we just need him to stop sneaking to the gas station to sneak treats.  [name] ----- Message ----- From: [name], RN Sent: 01/19/2021   8:24 AM EDT To: [name], LPN, [name], MD  I reviewed the lab results and recommendations to increase Lasix with follow up on 7/25. Should we hold on him starting exercise in Cardiac rehab on Monday until seen in follow up? Not sure how exercise will be impacted due to volume overload? Did you get the EKG strips I sent over?  Thanks for the advisement Mason Patton ----- Message ----- From: [name], MD Sent: 01/17/2021   7:17 AM EDT To: [name], RN  Thank you so much! I will take a look. We will have him come for repeat labs and see if he needs some supplementation. ----- Message ----- From: [name], RN Sent: 01/16/2021   3:28 PM EDT To: [name], MD  Dr. Shari Prows,  The above patient in today for cardiac rehab orientation.  Noted on his ekg strips frequent PVCs, Couplets and rare triplet.   Mason Patton was asymptomatic and was unaware of the ectopy.  Last echo showed EF 60-65%. Underlying history of afib.  I am sending over the strips for you to review.  Noteworthy last lab work for K+ was 3.2 and magnesium 1.6 these were obtained while he was in the hospital with staph infection. Pt weight quite elevated from his previous weight on 6/21- 244 today he is  253.  Started the increased dose for Lasix as instructed for weight gain.  Admits to dietary indiscretion with sodium and increased appetite. I am sending the strips over via fax for you to review. Thanks [name]

## 2021-01-19 NOTE — Telephone Encounter (Signed)
Pt and wife made aware of lab results and recommendations per Dr. Shari Prows.  Pt aware to increase his lasix to taking 40 mg po daily, and we will see him in clinic as planned for 7/25.  Advised the pt and wife that he must avoid the salty foods as much as possible, for this will worsen his fluid status.  Pt education provided.  Confirmed the pharmacy of choice with both parties.  Pt states he has enough lasix on hand at this time and notate to the pharmacy he will call for further refills.  Note placed to pharmacy.  Pt will increase his lasix this morning.  Pt and wife both verbalized understanding and agrees with this plan.

## 2021-01-22 ENCOUNTER — Encounter (HOSPITAL_COMMUNITY)
Admission: RE | Admit: 2021-01-22 | Discharge: 2021-01-22 | Disposition: A | Discharge status: Home / Self Care | Payer: No Typology Code available for payment source | Source: Ambulatory Visit | Attending: Cardiology | Admitting: Cardiology

## 2021-01-22 ENCOUNTER — Other Ambulatory Visit: Payer: Self-pay

## 2021-01-22 DIAGNOSIS — Z951 Presence of aortocoronary bypass graft: Secondary | ICD-10-CM | POA: Diagnosis not present

## 2021-01-22 DIAGNOSIS — Z952 Presence of prosthetic heart valve: Secondary | ICD-10-CM

## 2021-01-22 LAB — GLUCOSE, CAPILLARY
Glucose-Capillary: 157 mg/dL — ABNORMAL HIGH (ref 70–99)
Glucose-Capillary: 170 mg/dL — ABNORMAL HIGH (ref 70–99)

## 2021-01-22 NOTE — Progress Notes (Signed)
Daily Session Note  Patient Details  Name: Mason Patton MRN: [id number] Date of Birth: [birth year] Referring Provider:   Flowsheet Row CARDIAC REHAB PHASE II ORIENTATION from 01/16/2021 in [place]  Referring Provider Freada Bergeron, MD       Encounter Date: 01/22/2021  Check In:  Session Check In - 01/22/21 1101       Check-In   Supervising physician immediately available to respond to emergencies [facility] immediately available    Physician(s) Dr. Cruzita Lederer    Location [facility]    Staff Present Seward Carol, MS, ACSM CEP, Exercise Physiologist;Colene Mines Wilber Oliphant, RN, Milus Glazier, MS, ACSM-CEP, CCRP, Exercise Physiologist;Jetta Gilford Rile BS, ACSM EP-C, Exercise Physiologist;Other    Virtual Visit No    Medication changes reported     No    Fall or balance concerns reported    No    Tobacco Cessation No Change    Warm-up and Cool-down Performed on first and last piece of equipment   Cardiac Rehab Orientation   Resistance Training Performed Yes    VAD Patient? No    PAD/SET Patient? No      Pain Assessment   Currently in Pain? No/denies    Pain Score 0-No pain    Multiple Pain Sites No             Capillary Blood Glucose: Results for orders placed or performed during the hospital encounter of 01/22/21 (from the past 24 hour(s))  Glucose, capillary     Status: Abnormal   Collection Time: 01/22/21 11:57 AM  Result Value Ref Range   Glucose-Capillary 170 (H) 70 - 99 mg/dL      Social History   Tobacco Use  Smoking Status Every Day   Packs/day: 0.50   Years: 50.00   Pack years: 25.00   Types: Cigarettes  Smokeless Tobacco Never    Goals Met:  Exercise tolerated well No report of cardiac concerns or symptoms Strength training completed today  Goals Unmet:  Not Applicable  Comments: Pt started cardiac rehab today.  Pt tolerated light exercise without difficulty. VSS, telemetry-SR  frequent PVC's and couplets, asymptomatic. Dr. Johney Frame aware. Pt weight did not show a decrease as a result of his increased diuretic therapy.  Pt has an appointment on next Monday with Dr.Pemberton  Medication list reconciled. Pt denies barriers to medicaiton compliance.  PSYCHOSOCIAL ASSESSMENT:  PHQ-1. Pt exhibits positive coping skills, hopeful outlook with supportive family. Pt QOL scores above 21. No psychosocial needs identified at this time, no psychosocial interventions necessary.   Pt oriented to exercise equipment and routine.  Understanding verbalized. Maurice Small RN, BSN Cardiac and Pulmonary Rehab Nurse Navigator     Dr. Fransico Him is Medical Director for Cardiac Rehab at [facility].

## 2021-01-23 NOTE — Progress Notes (Signed)
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 Swallow whole., Disp: 30 tablet, Rfl: 11   atorvastatin (LIPITOR) 40 MG tablet, Take 40 mg by mouth at bedtime. Reported on 01/11/2016, Disp: , Rfl:    Blood Glucose Monitoring Suppl (ONETOUCH VERIO FLEX SYSTEM) W/DEVICE KIT, 1 each by Does not apply route daily. Dx: E11.9, Disp: 1 kit, Rfl: 0   carvedilol (COREG) 12.5 MG tablet, Take 1 tablet (12.5 mg total) by mouth 2 (two) times daily with a meal., Disp: 60 tablet, Rfl: 1   cholecalciferol (VITAMIN D3) 25 MCG (1000 UNIT) tablet, Take 1,000 Units by mouth daily., Disp: , Rfl:    Cyanocobalamin (VITAMIN B12) 500 MCG TABS, Take 500 mcg by mouth 2 (two) times daily., Disp: , Rfl:    ferrous sulfate 325 (65 FE) MG tablet, Take 325 mg by mouth 3 (three) times a week., Disp: , Rfl:    furosemide (LASIX) 40 MG tablet, Take 1 tablet (40 mg total) by mouth daily., Disp: 90 tablet, Rfl: 0   glipiZIDE  (GLUCOTROL) 10 MG tablet, Take 10 mg by mouth 2 (two) times daily before a meal., Disp: , Rfl:    glucose blood (ONETOUCH VERIO) test strip, Use to test blood sugar 2-3 times daily as instructed. Dx: E11.9, Disp: 200 each, Rfl: 3   HYDROcodone-acetaminophen (NORCO/VICODIN) 5-325 MG tablet, Take 1 tablet by mouth every 6 (six) hours as needed (for pain)., Disp: , Rfl:    levothyroxine (SYNTHROID) 200 MCG tablet, Take 1 tablet (200 mcg total) by mouth daily., Disp: 30 tablet, Rfl: 0   losartan (COZAAR) 50 MG tablet, Take 1 tablet (50 mg total) by mouth daily., Disp: 60 tablet, Rfl: 11   metFORMIN (GLUCOPHAGE) 500 MG tablet, Take 1,000 mg by mouth 2 (two) times daily with a meal., Disp: , Rfl:    Multiple Vitamins-Minerals (CENTRUM SILVER 50+MEN) TABS, Take 1 tablet by mouth daily with breakfast., Disp: , Rfl:    Multiple Vitamins-Minerals (PRESERVISION AREDS PO), Take 1 capsule by mouth 2 (two) times daily., Disp: , Rfl:    nitroGLYCERIN (NITROSTAT) 0.4 MG SL tablet, Place 0.4 mg under the tongue every 5 (five) minutes as needed., Disp: , Rfl:    omeprazole (PRILOSEC) 20 MG capsule, Take 20 mg by mouth daily before breakfast., Disp: , Rfl:    ONETOUCH DELICA LANCETS FINE MISC, Use to test blood sugar 2-3 times daily as instructed.  Dx: E11.9, Disp: 200 each, Rfl: 3   Pseudoephedrine-guaiFENesin (MUCINEX D PO), Take 1 tablet by mouth every 12 (twelve) hours., Disp: , Rfl:    VENTOLIN HFA 108 (90 Base) MCG/ACT inhaler, Inhale 1-2 puffs into the lungs every 4 (four) hours as needed for wheezing or shortness of breath. , Disp: , Rfl:   Past Medical History: Past Medical History:  Diagnosis Date   Aortic stenosis    09/30/19 echo ([facility], Cardiologist Dr. 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)   Meningoencephalitis    Obesity    OSA on CPAP    last sleep study >8 yrs   Pneumonia    "twice" (05/19/2013)   Shortness of breath    Stroke [facility])    "they said I had a minor one when I had menigitis" (05/19/2013)   Thyroid disease    Tobacco abuse    quit 11/08/2012   Type II diabetes mellitus ([place])    Umbilical hernia     Tobacco Use: Social History   Tobacco Use  Smoking Status Every Day   Packs/day: 0.50   Years: 50.00   Pack years: 25.00   Types: Cigarettes  Smokeless Tobacco Never    Labs: Recent Review Flowsheet Data     Labs for ITP Cardiac and Pulmonary Rehab Latest Ref Rng & Units 09/11/2020 09/11/2020 09/11/2020 09/11/2020 11/05/2020   Cholestrol 0 - 200 mg/dL - - - - -   LDLCALC 0 - 99 mg/dL - - - - -   HDL >39.00 mg/dL - - - - -   Trlycerides 0.0 - 149.0 mg/dL - - - - -   Hemoglobin A1c 4.8 - 5.6 % - - - - -   PHART 7.350 - 7.450 7.381 7.422 7.428 7.438 -   PCO2ART 32.0 - 48.0 mmHg 40.1 35.1 34.1 34.0 -   HCO3 20.0 - 28.0 mmol/L 23.8 22.9 22.6 23.1 -   TCO2 22 - 32 mmol/L '25 24 24 24 26   ' ACIDBASEDEF 0.0 - 2.0 mmol/L 1.0 1.0 2.0 1.0 -   O2SAT % 93.0 94.0 94.0 96.0 -       Capillary Blood Glucose: Lab Results  Component Value Date   GLUCAP 170 (H) 01/22/2021   GLUCAP 157 (H) 01/22/2021   GLUCAP 135 (H) 01/16/2021   GLUCAP 124 (H) 11/16/2020   GLUCAP 102 (H) 11/16/2020     Exercise Target Goals: Exercise Program Goal: Individual exercise prescription set using results from initial 6 min walk test and THRR while considering  patient's activity barriers and safety.   Exercise Prescription Goal: Initial  exercise prescription builds to 30-45 minutes a day of aerobic activity, 2-3 days per week.  Home exercise guidelines will be given to patient during program as part of exercise prescription that the participant will acknowledge.  Activity Barriers & Risk Stratification:  Activity Barriers & Cardiac Risk Stratification -  01/16/21 1533       Activity Barriers & Cardiac Risk Stratification   Activity Barriers Left Knee Replacement;Right Knee Replacement;History of Falls;Deconditioning    Cardiac Risk Stratification High             6 Minute Walk:  6 Minute Walk     Row Name 01/16/21 1342         6 Minute Walk   Phase Initial     Distance 800 feet     Walk Time 6 minutes     # of Rest Breaks 1     MPH 1.51     METS 1.5     RPE 11     Perceived Dyspnea  0     VO2 Peak 5.24     Symptoms Yes (comment)     Comments Patient c/o fatigue, seated rest break x1 for 1 minute, 19 seconds taken.     Resting HR 83 bpm     Resting BP 144/62     Resting Oxygen Saturation  98 %     Exercise Oxygen Saturation  during 6 min walk 100 %     Max Ex. HR 104 bpm     Max Ex. BP 168/64     2 Minute Post BP 148/70              Oxygen Initial Assessment:   Oxygen Re-Evaluation:   Oxygen Discharge (Final Oxygen Re-Evaluation):   Initial Exercise Prescription:  Initial Exercise Prescription - 01/16/21 1500       Date of Initial Exercise RX and Referring Provider   Date 01/16/21    Referring Provider Freada Bergeron, MD    Expected Discharge Date 03/16/21      NuStep   Level 2    SPM 85    Minutes 15    METs 1.8      Arm Ergometer   Level 1.5    Watts 20    Minutes 15    METs 1.9      Prescription Details   Frequency (times per week) 3    Duration Progress to 30 minutes of continuous aerobic without signs/symptoms of physical distress      Intensity   THRR 40-80% of Max Heartrate 58-116    Ratings of Perceived Exertion 11-13    Perceived Dyspnea 0-4       Progression   Progression Continue to progress workloads to maintain intensity without signs/symptoms of physical distress.      Resistance Training   Training Prescription Yes    Weight 3 lbs    Reps 10-15             Perform Capillary Blood Glucose checks as needed.  Exercise Prescription Changes:   Exercise Prescription Changes     Row Name 01/22/21 1103             Response to Exercise   Blood Pressure (Admit) 120/78       Blood Pressure (Exercise) 140/82       Blood Pressure (Exit) 118/60       Heart Rate (Admit) 83 bpm       Heart Rate (Exercise) 87 bpm       Heart Rate (Exit) 77 bpm       Rating of Perceived Exertion (Exercise) 13       Symptoms none       Comments Off to a good start with exercise.       Duration Continue  with 30 min of aerobic exercise without signs/symptoms of physical distress.       Intensity THRR unchanged               Progression     Progression Continue to progress workloads to maintain intensity without signs/symptoms of physical distress.       Average METs 1.5               Resistance Training     Training Prescription Yes       Weight 3 lbs       Reps 10-15       Time 10 Minutes               Interval Training     Interval Training No               NuStep     Level 2       SPM 80       Minutes 15       METs 1.5               Arm Ergometer     Level 1.5       Minutes 15       METs 1.5               Exercise Comments:   Exercise Comments     Row Name 01/22/21 1159           Exercise Comments Patient tolerated low intensity exercise well without symptoms. Oriented patient to stretches and weights.                Exercise Goals and Review:   Exercise Goals     Row Name 01/16/21 1308             Exercise Goals   Increase Physical Activity Yes       Intervention Provide advice, education, support and counseling about physical activity/exercise needs.;Develop an individualized exercise  prescription for aerobic and resistive training based on initial evaluation findings, risk stratification, comorbidities and participant's personal goals.       Expected Outcomes Short Term: Attend rehab on a regular basis to increase amount of physical activity.;Long Term: Exercising regularly at least 3-5 days a week.;Long Term: Add in home exercise to make exercise part of routine and to increase amount of physical activity.       Increase Strength and Stamina Yes       Intervention Provide advice, education, support and counseling about physical activity/exercise needs.;Develop an individualized exercise prescription for aerobic and resistive training based on initial evaluation findings, risk stratification, comorbidities and participant's personal goals.       Expected Outcomes Short Term: Increase workloads from initial exercise prescription for resistance, speed, and METs.;Short Term: Perform resistance training exercises routinely during rehab and add in resistance training at home;Long Term: Improve cardiorespiratory fitness, muscular endurance and strength as measured by increased METs and functional capacity (6MWT)       Able to understand and use rate of perceived exertion (RPE) scale Yes       Intervention Provide education and explanation on how to use RPE scale       Expected Outcomes Short Term: Able to use RPE daily in rehab to express subjective intensity level;Long Term:  Able to use RPE to guide intensity level when exercising independently       Knowledge and understanding of Target Heart Rate Range (THRR) Yes  Intervention Provide education and explanation of THRR including how the numbers were predicted and where they are located for reference       Expected Outcomes Short Term: Able to state/look up THRR;Long Term: Able to use THRR to govern intensity when exercising independently;Short Term: Able to use daily as guideline for intensity in rehab       Understanding of  Exercise Prescription Yes       Intervention Provide education, explanation, and written materials on patient's individual exercise prescription       Expected Outcomes Short Term: Able to explain program exercise prescription;Long Term: Able to explain home exercise prescription to exercise independently                Exercise Goals Re-Evaluation :  Exercise Goals Re-Evaluation     Row Name 01/22/21 1159             Exercise Goal Re-Evaluation   Exercise Goals Review Increase Physical Activity;Able to understand and use rate of perceived exertion (RPE) scale       Comments Patient able to understand and use RPE scale appropriately.       Expected Outcomes Progress workloads as tolerated to help increase strength and stamina.                Discharge Exercise Prescription (Final Exercise Prescription Changes):  Exercise Prescription Changes - 01/22/21 1103       Response to Exercise   Blood Pressure (Admit) 120/78    Blood Pressure (Exercise) 140/82    Blood Pressure (Exit) 118/60    Heart Rate (Admit) 83 bpm    Heart Rate (Exercise) 87 bpm    Heart Rate (Exit) 77 bpm    Rating of Perceived Exertion (Exercise) 13    Symptoms none    Comments Off to a good start with exercise.    Duration Continue with 30 min of aerobic exercise without signs/symptoms of physical distress.    Intensity THRR unchanged      Progression   Progression Continue to progress workloads to maintain intensity without signs/symptoms of physical distress.    Average METs 1.5      Resistance Training   Training Prescription Yes    Weight 3 lbs    Reps 10-15    Time 10 Minutes      Interval Training   Interval Training No      NuStep   Level 2    SPM 80    Minutes 15    METs 1.5      Arm Ergometer   Level 1.5    Minutes 15    METs 1.5             Nutrition:  Target Goals: Understanding of nutrition guidelines, daily intake of sodium <1585m, cholesterol <2035m calories  30% from fat and 7% or less from saturated fats, daily to have 5 or more servings of fruits and vegetables.  Biometrics:  Pre Biometrics - 01/16/21 1307       Pre Biometrics   Waist Circumference 44.5 inches    Hip Circumference 45 inches    Waist to Hip Ratio 0.99 %    Triceps Skinfold 25 mm    % Body Fat 35.6 %    Grip Strength 34.5 kg    Flexibility --   N/A, bilateral knee replacement.   Single Leg Stand 4 seconds              Nutrition Therapy Plan and Nutrition  Goals:   Nutrition Assessments:  MEDIFICTS Score Key: ?70 Need to make dietary changes  40-70 Heart Healthy Diet ? 40 Therapeutic Level Cholesterol Diet    Picture Your Plate Scores: <08 Unhealthy dietary pattern with much room for improvement. 41-50 Dietary pattern unlikely to meet recommendations for good health and room for improvement. 51-60 More healthful dietary pattern, with some room for improvement.  >60 Healthy dietary pattern, although there may be some specific behaviors that could be improved.    Nutrition Goals Re-Evaluation:   Nutrition Goals Re-Evaluation:   Nutrition Goals Discharge (Final Nutrition Goals Re-Evaluation):   Psychosocial: Target Goals: Acknowledge presence or absence of significant depression and/or stress, maximize coping skills, provide positive support system. Participant is able to verbalize types and ability to use techniques and skills needed for reducing stress and depression.  Initial Review & Psychosocial Screening:  Initial Psych Review & Screening - 01/16/21 1443       Initial Review   Current issues with None Identified      Family Dynamics   Good Support System? Yes   wife accompained Elenore Rota     Barriers   Psychosocial barriers to participate in program There are no identifiable barriers or psychosocial needs.      Screening Interventions   Interventions Encouraged to exercise    Expected Outcomes Short Term goal: Identification and review  with participant of any Quality of Life or Depression concerns found by scoring the questionnaire.;Long Term goal: The participant improves quality of Life and PHQ9 Scores as seen by post scores and/or verbalization of changes             Quality of Life Scores:  Quality of Life - 01/16/21 1452       Quality of Life   Select Quality of Life      Quality of Life Scores   Health/Function Pre 25.21 %    Socioeconomic Pre 28.21 %    Psych/Spiritual Pre 28.29 %    Family Pre 30 %    GLOBAL Pre 27.23 %            Scores of 19 and below usually indicate a poorer quality of life in these areas.  A difference of  2-3 points is a clinically meaningful difference.  A difference of 2-3 points in the total score of the Quality of Life Index has been associated with significant improvement in overall quality of life, self-image, physical symptoms, and general health in studies assessing change in quality of life.  PHQ-9: Recent Review Flowsheet Data     Depression screen [facility] 2/9 01/22/2021 04/20/2013   Decreased Interest 0 0   Down, Depressed, Hopeless 0 0   PHQ - 2 Score 0 0   Altered sleeping 0 -   Tired, decreased energy 1 -   Change in appetite 0 -   Feeling bad or failure about yourself  0 -   Trouble concentrating 0 -   Moving slowly or fidgety/restless 0 -   Suicidal thoughts 0 -   PHQ-9 Score 1 -   Difficult doing work/chores Not difficult at all -      Interpretation of Total Score  Total Score Depression Severity:  1-4 = Minimal depression, 5-9 = Mild depression, 10-14 = Moderate depression, 15-19 = Moderately severe depression, 20-27 = Severe depression   Psychosocial Evaluation and Intervention:   Psychosocial Re-Evaluation:   Psychosocial Discharge (Final Psychosocial Re-Evaluation):   Vocational Rehabilitation: Provide vocational rehab assistance to qualifying candidates.  Vocational Rehab Evaluation & Intervention:  Vocational Rehab - 01/16/21 1444        Initial Vocational Rehab Evaluation & Intervention   Assessment shows need for Vocational Rehabilitation No             Education: Education Goals: Education classes will be provided on a weekly basis, covering required topics. Participant will state understanding/return demonstration of topics presented.  Learning Barriers/Preferences:  Learning Barriers/Preferences - 01/16/21 1444       Learning Barriers/Preferences   Learning Barriers Sight    Learning Preferences Audio;Group Instruction;Individual Instruction;Written Material             Education Topics: Count Your Pulse:  -Group instruction provided by verbal instruction, demonstration, patient participation and written materials to support subject.  Instructors address importance of being able to find your pulse and how to count your pulse when at home without a heart monitor.  Patients get hands on experience counting their pulse with staff help and individually.   Heart Attack, Angina, and Risk Factor Modification:  -Group instruction provided by verbal instruction, video, and written materials to support subject.  Instructors address signs and symptoms of angina and heart attacks.    Also discuss risk factors for heart disease and how to make changes to improve heart health risk factors.   Functional Fitness:  -Group instruction provided by verbal instruction, demonstration, patient participation, and written materials to support subject.  Instructors address safety measures for doing things around the house.  Discuss how to get up and down off the floor, how to pick things up properly, how to safely get out of a chair without assistance, and balance training.   Meditation and Mindfulness:  -Group instruction provided by verbal instruction, patient participation, and written materials to support subject.  Instructor addresses importance of mindfulness and meditation practice to help reduce stress and improve  awareness.  Instructor also leads participants through a meditation exercise.    Stretching for Flexibility and Mobility:  -Group instruction provided by verbal instruction, patient participation, and written materials to support subject.  Instructors lead participants through series of stretches that are designed to increase flexibility thus improving mobility.  These stretches are additional exercise for major muscle groups that are typically performed during regular warm up and cool down.   Hands Only CPR:  -Group verbal, video, and participation provides a basic overview of AHA guidelines for community CPR. Role-play of emergencies allow participants the opportunity to practice calling for help and chest compression technique with discussion of AED use.   Hypertension: -Group verbal and written instruction that provides a basic overview of hypertension including the most recent diagnostic guidelines, risk factor reduction with self-care instructions and medication management.    Nutrition I class: Heart Healthy Eating:  -Group instruction provided by PowerPoint slides, verbal discussion, and written materials to support subject matter. The instructor gives an explanation and review of the Therapeutic Lifestyle Changes diet recommendations, which includes a discussion on lipid goals, dietary fat, sodium, fiber, plant stanol/sterol esters, sugar, and the components of a well-balanced, healthy diet.   Nutrition II class: Lifestyle Skills:  -Group instruction provided by PowerPoint slides, verbal discussion, and written materials to support subject matter. The instructor gives an explanation and review of label reading, grocery shopping for heart health, heart healthy recipe modifications, and ways to make healthier choices when eating out.   Diabetes Question & Answer:  -Group instruction provided by PowerPoint slides, verbal discussion, and written materials to support subject matter.  The  instructor gives an explanation and review of diabetes co-morbidities, pre- and post-prandial blood glucose goals, pre-exercise blood glucose goals, signs, symptoms, and treatment of hypoglycemia and hyperglycemia, and foot care basics.   Diabetes Blitz:  -Group instruction provided by PowerPoint slides, verbal discussion, and written materials to support subject matter. The instructor gives an explanation and review of the physiology behind type 1 and type 2 diabetes, diabetes medications and rational behind using different medications, pre- and post-prandial blood glucose recommendations and Hemoglobin A1c goals, diabetes diet, and exercise including blood glucose guidelines for exercising safely.    Portion Distortion:  -Group instruction provided by PowerPoint slides, verbal discussion, written materials, and food models to support subject matter. The instructor gives an explanation of serving size versus portion size, changes in portions sizes over the last 20 years, and what consists of a serving from each food group.   Stress Management:  -Group instruction provided by verbal instruction, video, and written materials to support subject matter.  Instructors review role of stress in heart disease and how to cope with stress positively.     Exercising on Your Own:  -Group instruction provided by verbal instruction, power point, and written materials to support subject.  Instructors discuss benefits of exercise, components of exercise, frequency and intensity of exercise, and end points for exercise.  Also discuss use of nitroglycerin and activating EMS.  Review options of places to exercise outside of rehab.  Review guidelines for sex with heart disease.   Cardiac Drugs I:  -Group instruction provided by verbal instruction and written materials to support subject.  Instructor reviews cardiac drug classes: antiplatelets, anticoagulants, beta blockers, and statins.  Instructor discusses  reasons, side effects, and lifestyle considerations for each drug class.   Cardiac Drugs II:  -Group instruction provided by verbal instruction and written materials to support subject.  Instructor reviews cardiac drug classes: angiotensin converting enzyme inhibitors (ACE-I), angiotensin II receptor blockers (ARBs), nitrates, and calcium channel blockers.  Instructor discusses reasons, side effects, and lifestyle considerations for each drug class.   Anatomy and Physiology of the Circulatory System:  Group verbal and written instruction and models provide basic cardiac anatomy and physiology, with the coronary electrical and arterial systems. Review of: AMI, Angina, Valve disease, Heart Failure, Peripheral Artery Disease, Cardiac Arrhythmia, Pacemakers, and the ICD.   Other Education:  -Group or individual verbal, written, or video instructions that support the educational goals of the cardiac rehab program.   Holiday Eating Survival Tips:  -Group instruction provided by PowerPoint slides, verbal discussion, and written materials to support subject matter. The instructor gives patients tips, tricks, and techniques to help them not only survive but enjoy the holidays despite the onslaught of food that accompanies the holidays.   Knowledge Questionnaire Score:  Knowledge Questionnaire Score - 01/16/21 1453       Knowledge Questionnaire Score   Pre Score 23/28             Core Components/Risk Factors/Patient Goals at Admission:  Personal Goals and Risk Factors at Admission - 01/16/21 1329       Core Components/Risk Factors/Patient Goals on Admission    Weight Management Yes;Obesity    Intervention Weight Management/Obesity: Establish reasonable short term and long term weight goals.;Obesity: Provide education and appropriate resources to help participant work on and attain dietary goals.    Admit Weight 254 lb 6.6 oz (115.4 kg)    Goal Weight: Short Term 244 lb (110.7 kg)     Goal  Weight: Long Term 230 lb (104.3 kg)    Expected Outcomes Short Term: Continue to assess and modify interventions until short term weight is achieved;Long Term: Adherence to nutrition and physical activity/exercise program aimed toward attainment of established weight goal;Weight Loss: Understanding of general recommendations for a balanced deficit meal plan, which promotes 1-2 lb weight loss per week and includes a negative energy balance of [phone] kcal/d;Understanding recommendations for meals to include 15-35% energy as protein, 25-35% energy from fat, 35-60% energy from carbohydrates, less than 235m of dietary cholesterol, 20-35 gm of total fiber daily;Understanding of distribution of calorie intake throughout the day with the consumption of 4-5 meals/snacks    Diabetes Yes    Intervention Provide education about signs/symptoms and action to take for hypo/hyperglycemia.;Provide education about proper nutrition, including hydration, and aerobic/resistive exercise prescription along with prescribed medications to achieve blood glucose in normal ranges: Fasting glucose 65-99 mg/dL    Expected Outcomes Short Term: Participant verbalizes understanding of the signs/symptoms and immediate care of hyper/hypoglycemia, proper foot care and importance of medication, aerobic/resistive exercise and nutrition plan for blood glucose control.;Long Term: Attainment of HbA1C < 7%.    Heart Failure Yes    Intervention Provide a combined exercise and nutrition program that is supplemented with education, support and counseling about heart failure. Directed toward relieving symptoms such as shortness of breath, decreased exercise tolerance, and extremity edema.    Expected Outcomes Improve functional capacity of life;Short term: Attendance in program 2-3 days a week with increased exercise capacity. Reported lower sodium intake. Reported increased fruit and vegetable intake. Reports medication compliance.;Short term:  Daily weights obtained and reported for increase. Utilizing diuretic protocols set by physician.;Long term: Adoption of self-care skills and reduction of barriers for early signs and symptoms recognition and intervention leading to self-care maintenance.    Hypertension Yes    Intervention Provide education on lifestyle modifcations including regular physical activity/exercise, weight management, moderate sodium restriction and increased consumption of fresh fruit, vegetables, and low fat dairy, alcohol moderation, and smoking cessation.;Monitor prescription use compliance.    Expected Outcomes Short Term: Continued assessment and intervention until BP is < 140/965mHG in hypertensive participants. < 130/807mG in hypertensive participants with diabetes, heart failure or chronic kidney disease.;Long Term: Maintenance of blood pressure at goal levels.    Lipids Yes    Intervention Provide education and support for participant on nutrition & aerobic/resistive exercise along with prescribed medications to achieve LDL <68m32mDL >40mg57m Expected Outcomes Short Term: Participant states understanding of desired cholesterol values and is compliant with medications prescribed. Participant is following exercise prescription and nutrition guidelines.;Long Term: Cholesterol controlled with medications as prescribed, with individualized exercise RX and with personalized nutrition plan. Value goals: LDL < 68mg,26m > 40 mg.             Core Components/Risk Factors/Patient Goals Review:    Core Components/Risk Factors/Patient Goals at Discharge (Final Review):    ITP Comments:  ITP Comments     Row Name 01/16/21 1308           ITP Comments Medical Director- Dr. Traci Fransico Him              Comments: See ITP Comments

## 2021-01-24 ENCOUNTER — Encounter (HOSPITAL_COMMUNITY): Payer: No Typology Code available for payment source

## 2021-01-26 ENCOUNTER — Telehealth: Payer: Self-pay | Admitting: Physician Assistant

## 2021-01-26 ENCOUNTER — Encounter (HOSPITAL_COMMUNITY)
Admission: RE | Admit: 2021-01-26 | Discharge: 2021-01-26 | Disposition: A | Discharge status: Home / Self Care | Payer: No Typology Code available for payment source | Source: Ambulatory Visit | Attending: Cardiology | Admitting: Cardiology

## 2021-01-26 ENCOUNTER — Other Ambulatory Visit: Payer: Self-pay

## 2021-01-26 DIAGNOSIS — Z951 Presence of aortocoronary bypass graft: Secondary | ICD-10-CM | POA: Diagnosis not present

## 2021-01-26 DIAGNOSIS — Z952 Presence of prosthetic heart valve: Secondary | ICD-10-CM

## 2021-01-26 LAB — GLUCOSE, CAPILLARY: Glucose-Capillary: 184 mg/dL — ABNORMAL HIGH (ref 70–99)

## 2021-01-26 NOTE — Progress Notes (Signed)
Mason Patton 75 y.o. male Nutrition Note  Diagnosis:CABGx2, AVR  Past Medical History:  Diagnosis Date   Aortic stenosis    09/30/19 echo ([facility], Cardiologist Dr. 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   Current Outpatient Medications:    acetaminophen (TYLENOL) 325 MG tablet, Take 2 tablets (650 mg total) by mouth every 4 (four) hours as needed for headache or mild pain., Disp: , Rfl:    amoxicillin (AMOXIL) 500 MG tablet, Take 4 tablets by mouth 30-60 minutes prior to your dental cleaning, Disp: 4 tablet, Rfl: 2   apixaban (ELIQUIS) 5 MG TABS tablet, Take 5 mg by mouth 2 (two) times daily., Disp: , Rfl:    aspirin EC 81 MG EC tablet, Take 1 tablet (81 mg total) by mouth daily. Swallow whole., Disp: 30 tablet, Rfl: 11   atorvastatin (LIPITOR) 40 MG tablet, Take 40 mg by mouth at bedtime. Reported on 01/11/2016, Disp: , Rfl:    Blood Glucose Monitoring Suppl (ONETOUCH VERIO FLEX SYSTEM) W/DEVICE KIT, 1 each by Does not apply route daily. Dx: E11.9, Disp: 1 kit, Rfl: 0   carvedilol  (COREG) 12.5 MG tablet, Take 1 tablet (12.5 mg total) by mouth 2 (two) times daily with a meal., Disp: 60 tablet, Rfl: 1   cholecalciferol (VITAMIN D3) 25 MCG (1000 UNIT) tablet, Take 1,000 Units by mouth daily., Disp: , Rfl:    Cyanocobalamin (VITAMIN B12) 500 MCG TABS, Take 500 mcg by mouth 2 (two) times daily., Disp: , Rfl:    ferrous sulfate 325 (65 FE) MG tablet, Take 325 mg by mouth 3 (three) times a week., Disp: , Rfl:    furosemide (LASIX) 40 MG tablet, Take 1 tablet (40 mg total) by mouth daily., Disp: 90 tablet, Rfl: 0   glipiZIDE (GLUCOTROL) 10 MG tablet, Take 10 mg by mouth 2 (two) times daily before a meal., Disp: , Rfl:    glucose blood (ONETOUCH VERIO) test strip, Use to test blood sugar 2-3 times daily as instructed. Dx: E11.9, Disp: 200 each, Rfl: 3   HYDROcodone-acetaminophen (NORCO/VICODIN) 5-325 MG tablet, Take 1 tablet by mouth every 6 (six) hours as needed (for pain)., Disp: , Rfl:    levothyroxine (SYNTHROID) 200 MCG tablet, Take 1 tablet (200 mcg total) by mouth daily., Disp: 30 tablet, Rfl: 0   losartan (COZAAR) 50 MG tablet, Take 1 tablet (50 mg total)  by mouth daily., Disp: 60 tablet, Rfl: 11   metFORMIN (GLUCOPHAGE) 500 MG tablet, Take 1,000 mg by mouth 2 (two) times daily with a meal., Disp: , Rfl:    Multiple Vitamins-Minerals (CENTRUM SILVER 50+MEN) TABS, Take 1 tablet by mouth daily with breakfast., Disp: , Rfl:    Multiple Vitamins-Minerals (PRESERVISION AREDS PO), Take 1 capsule by mouth 2 (two) times daily., Disp: , Rfl:    nitroGLYCERIN (NITROSTAT) 0.4 MG SL tablet, Place 0.4 mg under the tongue every 5 (five) minutes as needed., Disp: , Rfl:    omeprazole (PRILOSEC) 20 MG capsule, Take 20 mg by mouth daily before breakfast., Disp: , Rfl:    ONETOUCH DELICA LANCETS FINE MISC, Use to test blood sugar 2-3 times daily as instructed. Dx: E11.9, Disp: 200 each, Rfl: 3   Pseudoephedrine-guaiFENesin (MUCINEX D PO), Take 1 tablet by mouth every 12 (twelve) hours., Disp: ,  Rfl:    VENTOLIN HFA 108 (90 Base) MCG/ACT inhaler, Inhale 1-2 puffs into the lungs every 4 (four) hours as needed for wheezing or shortness of breath. , Disp: , Rfl:    Ht Readings from Last 1 Encounters:  01/16/21 '5\' 9"'  (1.753 m)     Wt Readings from Last 3 Encounters:  01/16/21 254 lb 6.6 oz (115.4 kg)  12/26/20 245 lb (111.1 kg)  11/16/20 226 lb 3.1 oz (102.6 kg)     There is no height or weight on file to calculate BMI.   Social History   Tobacco Use  Smoking Status Every Day   Packs/day: 0.50   Years: 50.00   Pack years: 25.00   Types: Cigarettes  Smokeless Tobacco Never     Lab Results  Component Value Date   CHOL 106 04/12/2016   Lab Results  Component Value Date   HDL 27.50 (L) 04/12/2016   Lab Results  Component Value Date   LDLCALC 57 04/12/2016   Lab Results  Component Value Date   TRIG 109.0 04/12/2016     Lab Results  Component Value Date   HGBA1C 7.4 (H) 09/06/2020     CBG (last 3)  No results for input(s): GLUCAP in the last 72 hours.   Nutrition Note  Spoke with pt. Nutrition Plan and Nutrition Survey goals reviewed with pt.   Pt has Type 2 Diabetes. Last A1c indicates blood glucose well-controlled. Pt checks CBG's 1 times a day. Fasting CBG's reportedly 10--140 mg/dL.   Pt reports reducing sodium intake. He feels he has done enough sodium reduction and states he is not interested in continuing to avoid high sodium foods that are still in his diet. At home,  his wife tries to follow a low sodium diet. He avoids foods that are >300 mg sodium per serving. He does not use the salt shaker. He avoids processed foods at home. He does not snack. He gets a bacon and tomato biscuit 3 mornings per week. He does not want to change it to a lower sodium option. He eats out for dinner 4 nights per week and often chooses fried foods. Today he had increased SOB.  He is taking lasix as prescribed.  He reports weighing daily at home and says he has gained  6-7 lbs this week.   Pt expressed understanding of the information reviewed.    Nutrition Diagnosis Excessive sodium intake related to pt preferences and food choices as evidenced by eating restaurant food >7 times per week and many days twice in one day  Nutrition Intervention Pt's  individual nutrition plan reviewed with pt. Benefits of adopting Heart Healthy diet discussed when Picture Your Plate reviewed.  Continue client-centered nutrition education by RD, as part of interdisciplinary care.  Goal(s) Pt to reduce sodium intake to 1500 mg/day by avoiding eating out twice in one day  Pt to avoid fried foods and processed meats   Plan:   Will provide client-centered nutrition education as part of interdisciplinary care Monitor and evaluate progress toward nutrition goal with team.   Michaele Offer, MS, RDN, LDN

## 2021-01-26 NOTE — Telephone Encounter (Signed)
Completely agree. He has been noncompliant with sodium restriction. Will likely need torsemide at our visit.

## 2021-01-26 NOTE — Progress Notes (Signed)
Weight 116.5 kg today. Patient reports feeling more short of breath. Oxygen saturation 98% on room air. Upon assessment lungs fields  with scattered expiratory wheezes. Patient has 2-3 + bilateral ankle edema. Mason Patton reports that he is taking the furosemide that was prescribed for him. Mason Patton admits to still smoking 1/2 PPD and has no desire to stop smoking at this time. [name] PAC paged and notified about weight gain. Hal instructed the patient over the phone to increase his furosemide to 80 mg in the morning and 40 mg in the evening. Patient aware of and knows to follow up with Dr Shari Prows on Monday. Patient asked to hold off on exercise at cardiac until cleared to return by Dr Shari Prows. Patient states understanding. Exit BP 128/68 with large cuff. Heart rate 71. Telemetry rhythm Sinus with PVC's. Patient left cardiac rehab without comlpaints and know to report to the ED if his shortness of breath gets worse. Patient states understanding.Gladstone Lighter, RN,BSN 01/26/2021 12:28 PM

## 2021-01-26 NOTE — Telephone Encounter (Signed)
[name] of cardiac rehab regarding weight gain.  Patient was previously seen by Juanda Crumble, PA-C on 12/27/2019, weight at the time was 245 pounds.  He has a history of persistent A. 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  Since the last visit, his weight yesterday by home scale was 253 pounds.  Today during cardiac rehab, he was noted to have 2+ bilateral pitting edema.  Weight is 116.5 kg which is 256 pounds.  He is clearly volume overloaded.  Talking with the patient, he has self increase the Lasix to 40 mg twice a day for the past 2 days from the previous 40 mg daily dosing.  I recommended temporarily further increase the Lasix to 80 mg a.m. and 40 mg p.m. until he can be seen by Dr. Shari Prows in the office next Monday.  He may need to be switched to torsemide.

## 2021-01-27 NOTE — Progress Notes (Signed)
Cardiology Office Note:    Date:  01/29/2021   ID:  Mason Patton, DOB 1946-03-27, MRN [id number]  PCP:  Heywood Bene, PA-C   [facility] Providers Cardiologist:  Freada Bergeron, MD {    Referring MD: Heywood Bene, *    History of Present Illness:    Mason Patton is a 75 y.o. 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 Now presenting to clinic for follow-up.   Patient seen in clinic on 09/05/20 where he was having worsening DOE and exertional chest pain. Stress test at the [facility] revealed multiple defects concerning for ischemia in the RCA and LAD territory. He had several recent episodes of chest pain and was noted to be in bi/trigeminy in the office. Trop was elevated and patient was subsequently referred to the ER for further evaluation.   He was admitted to [facility] from 09/05/20-09/17/20. Underwent coronary angiography which revealed severe two-vessel coronary disease with diffusely calcified coronaries.  The proximal LAD had an 80% hazy stenosis.  The RCA had 80% proximal and 80% mid stenoses. TTE als showed severe AS with AVA 0.67, mean gradient 15mHg. He underwent CABG x 2, LA clip, and bioprosthetic AVR on 09/11/2020. Post-op, he had mild AKI which improved with a dopamine gtt. He also required diuresis with lasix and metolazone.   Last seen in clinic on 10/02/20 where he was having worsening DOE and LE edema in the setting of dieatry noncompliance. We increased his diuretic with improvement, however, he continues to be noncompliant with diet.  Was hospitalized from 11/08/20-11/16/20 with staph epidermidis bacteremia. TEE fortunately without evidence of vegetations. Completed course of  ABX and was discharged home.  Today, the patient overall feels better. Took lasix 873min AM and 4051mn the evening for 3 days and lost 10lbs. Denies chest pain, orthopnea or PND. Admits that it is hard for him to adhere to a low Na diet and he has been frequently eating take-out. He is trying to monitor his sodium intake, however, and his wife is helping significantly with cooking at home. Otherwise, he is taking his medications as prescribed.     Past Medical History:  Diagnosis Date   Aortic stenosis    09/30/19 echo ([facility], Cardiologist Dr. Li Geraldo PitterMild-moderate AS, MaxVel 308 cm/s, MeanPG 22 mmHg, AVA 1.6 cm2   Arthritis    Atrial fibrillation (HCC)    Carotid artery stenosis    11/03/19 [place] [facility] 50-69% RICA stenosis   Cholecystitis with cholelithiasis    Eczema    Emphysema of lung (HCC)    Gallstones and inflammation of gallbladder without obstruction    Heart murmur    History of nuclear stress test 02/23/2010   dipyridamole; normal pattern of perfusion in all regions; normal, low risk study    Hyperlipidemia    Hypertension    Hypothyroidism    "had thyroid killed w/radiation" (05/19/2013)   Meningoencephalitis    Obesity    OSA on CPAP    last sleep study >8 yrs   Pneumonia    "twice" (05/19/2013)   Shortness of breath    [facility]  "they said I had a minor one when I had menigitis" (05/19/2013)  Thyroid disease    Tobacco abuse    quit 11/08/2012   Type II diabetes mellitus ([place])    Umbilical hernia     Past Surgical History:  Procedure Laterality Date   AORTIC VALVE REPLACEMENT N/A 09/11/2020   Procedure: AORTIC VALVE REPLACEMENT (AVR);  Surgeon: Gaye Pollack, MD;  Location: [place];  Service: Open Heart Surgery;  Laterality: N/A;   CHOLECYSTECTOMY  09/09/2011   Procedure: LAPAROSCOPIC CHOLECYSTECTOMY WITH INTRAOPERATIVE CHOLANGIOGRAM;  Surgeon: Belva Crome, MD;  Location: [place];  Service: General;  Laterality: N/A;    CLIPPING OF ATRIAL APPENDAGE N/A 09/11/2020   Procedure: CLIPPING OF ATRIAL APPENDAGE;  Surgeon: Gaye Pollack, MD;  Location: [place];  Service: Open Heart Surgery;  Laterality: N/A;   CORONARY ARTERY BYPASS GRAFT N/A 09/11/2020   Procedure: CORONARY ARTERY BYPASS GRAFTING (CABG), ON PUMP, TIMES 2 , USING LEFT INTERNAL MAMMARY ARTERY AND ENDOSCOPICALLY HARVESTED RIGHT GREATER SAPHENOUS VEIN;  Surgeon: Gaye Pollack, MD;  Location: [place];  Service: Open Heart Surgery;  Laterality: N/A;   HERNIA REPAIR  04/2006   UHR   IR THORACENTESIS ASP PLEURAL SPACE W/IMG GUIDE  10/25/2020   JOINT REPLACEMENT     KNEE ARTHROSCOPY Right    "w2 times" (05/19/2013)   RIGHT/LEFT HEART CATH AND CORONARY ANGIOGRAPHY N/A 09/07/2020   Procedure: RIGHT/LEFT HEART CATH AND CORONARY ANGIOGRAPHY;  Surgeon: Lorretta Harp, MD;  Location: [place] CV LAB;  Service: Cardiovascular;  Laterality: N/A;   TEE WITHOUT CARDIOVERSION N/A 09/11/2020   Procedure: TRANSESOPHAGEAL ECHOCARDIOGRAM (TEE);  Surgeon: Gaye Pollack, MD;  Location: [place];  Service: Open Heart Surgery;  Laterality: N/A;   TEE WITHOUT CARDIOVERSION N/A 11/16/2020   Procedure: TRANSESOPHAGEAL ECHOCARDIOGRAM (TEE);  Surgeon: Sueanne Margarita, MD;  Location: [facility] ENDOSCOPY;  Service: Cardiovascular;  Laterality: N/A;   TONSILLECTOMY     TOTAL KNEE ARTHROPLASTY Left 05/19/2013   TOTAL KNEE ARTHROPLASTY Left 05/19/2013   Procedure: TOTAL KNEE ARTHROPLASTY;  Surgeon: Newt Minion, MD;  Location: [place];  Service: Orthopedics;  Laterality: Left;  Left Total Knee Arthroplasty   TOTAL KNEE ARTHROPLASTY Right 11/24/2019   TOTAL KNEE ARTHROPLASTY Right 11/24/2019   Procedure: RIGHT TOTAL KNEE ARTHROPLASTY;  Surgeon: Newt Minion, MD;  Location: [place];  Service: Orthopedics;  Laterality: Right;   TRANSTHORACIC ECHOCARDIOGRAM  03/28/2005   EF=>55%, mod conc LVH; LA mod dilated & borderline RA enlargement; mild TR; mild pulm valve regurg    Current Medications: Current  Meds  Medication Sig   acetaminophen (TYLENOL) 325 MG tablet Take 2 tablets (650 mg total) by mouth every 4 (four) hours as needed for headache or mild pain.   amoxicillin (AMOXIL) 500 MG tablet Take 4 tablets by mouth 30-60 minutes prior to your dental cleaning   apixaban (ELIQUIS) 5 MG TABS tablet Take 5 mg by mouth 2 (two) times daily.   aspirin EC 81 MG EC tablet Take 1 tablet (81 mg total) by mouth daily. Swallow whole.   atorvastatin (LIPITOR) 40 MG tablet Take 40 mg by mouth at bedtime. Reported on 01/11/2016   Blood Glucose Monitoring Suppl (ONETOUCH VERIO FLEX SYSTEM) W/DEVICE KIT 1 each by Does not apply route daily. Dx: E11.9   carvedilol (COREG) 12.5 MG tablet Take 1 tablet (12.5 mg total) by mouth 2 (two) times daily with a meal.   cholecalciferol (VITAMIN D3) 25 MCG (1000 UNIT) tablet Take 1,000 Units by mouth daily.   Cyanocobalamin (VITAMIN B12) 500 MCG  TABS Take 500 mcg by mouth 2 (two) times daily.   ferrous sulfate 325 (65 FE) MG tablet Take 325 mg by mouth 3 (three) times a week.   glipiZIDE (GLUCOTROL) 10 MG tablet Take 10 mg by mouth 2 (two) times daily before a meal.   glucose blood (ONETOUCH VERIO) test strip Use to test blood sugar 2-3 times daily as instructed. Dx: E11.9   HYDROcodone-acetaminophen (NORCO/VICODIN) 5-325 MG tablet Take 1 tablet by mouth every 6 (six) hours as needed (for pain).   losartan (COZAAR) 50 MG tablet Take 1 tablet (50 mg total) by mouth daily.   metFORMIN (GLUCOPHAGE) 500 MG tablet Take 1,000 mg by mouth 2 (two) times daily with a meal.   Multiple Vitamins-Minerals (CENTRUM SILVER 50+MEN) TABS Take 1 tablet by mouth daily with breakfast.   Multiple Vitamins-Minerals (PRESERVISION AREDS PO) Take 1 capsule by mouth 2 (two) times daily.   nitroGLYCERIN (NITROSTAT) 0.4 MG SL tablet Place 0.4 mg under the tongue every 5 (five) minutes as needed.   omeprazole (PRILOSEC) 20 MG capsule Take 20 mg by mouth daily before breakfast.   ONETOUCH DELICA LANCETS  FINE MISC Use to test blood sugar 2-3 times daily as instructed. Dx: E11.9   Pseudoephedrine-guaiFENesin (MUCINEX D PO) Take 1 tablet by mouth every 12 (twelve) hours.   torsemide (DEMADEX) 20 MG tablet Take 40 mg by mouth in the AM, then 40 mg by mouth in the PM for 3 days only, then decrease to taking 40 mg by mouth daily thereafter.   VENTOLIN HFA 108 (90 Base) MCG/ACT inhaler Inhale 1-2 puffs into the lungs every 4 (four) hours as needed for wheezing or shortness of breath.    [DISCONTINUED] furosemide (LASIX) 40 MG tablet Take 1 tablet (40 mg total) by mouth daily.     Allergies:   Codeine and Demerol   Social History   Socioeconomic History   Marital status: Married    Spouse name: sandra   Number of children: 3   Years of education: tech   Highest education level: Not on file  Occupational History    Employer: [employer]  Tobacco Use   Smoking status: Every Day    Packs/day: 0.50    Years: 50.00    Pack years: 25.00    Types: Cigarettes   Smokeless tobacco: Never  Vaping Use   Vaping Use: Never used  Substance and Sexual Activity   Alcohol use: No   Drug use: No   Sexual activity: Yes  Other Topics Concern   Not on file  Social History Narrative   Not on file   Social Determinants of Health   Financial Resource Strain: Not on file  Food Insecurity: Not on file  Transportation Needs: Not on file  Physical Activity: Not on file  Stress: Not on file  Social Connections: Not on file     Family History: The patient's family history includes Asthma in his child; Cancer in his maternal aunt and maternal aunt; Diabetes in his maternal grandmother; Diabetes type I in his child; Hyperlipidemia in his father; Thyroid disease in his mother.  ROS:   Please see the history of present illness.    Review of Systems  Constitutional:  Negative for chills and fever.  HENT:  Negative for sore throat.   Eyes:  Negative for blurred vision.  Respiratory:  Positive for shortness  of breath.   Cardiovascular:  Positive for leg swelling. Negative for chest pain, palpitations, orthopnea and claudication.  Gastrointestinal:  Negative for nausea and vomiting.  Genitourinary:  Negative for hematuria.  Musculoskeletal:  Negative for falls.  Neurological:  Negative for dizziness and loss of consciousness.  Psychiatric/Behavioral:  Positive for substance abuse (Tobacco).     EKGs/Labs/Other Studies Reviewed:    The following studies were reviewed today: TTE Sep 28, 2020: 1. Left ventricular ejection fraction, by estimation, is 45 to 50%. The  left ventricle has mildly decreased function. The left ventricle  demonstrates global hypokinesis. Left ventricular diastolic parameters are  indeterminate.   2. Right ventricular systolic function is normal. The right ventricular  size is normal. Tricuspid regurgitation signal is inadequate for assessing  PA pressure.   3. Left atrial size was moderately dilated.   4. Right atrial size was mildly dilated.   5. The mitral valve is normal in structure. Trivial mitral valve  regurgitation. No evidence of mitral stenosis. Moderate mitral annular  calcification.   6. The aortic valve is tricuspid. Aortic valve regurgitation is not  visualized. Severe aortic valve stenosis. Aortic valve area, by VTI  measures 0.67 cm. Aortic valve mean gradient measures 35.0 mmHg.   7. Aortic dilatation noted. 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   8. The inferior vena cava is normal in size with <50% respiratory  variability, suggesting right atrial pressure of 8 mmHg.   Cath 2020/09/28:   Prox RCA lesion is 80% stenosed. Mid RCA lesion is 80% stenosed. Prox LAD lesion is 80% stenosed. Hemodynamic findings consistent with aortic valve stenosis.     CABG Op Note 09/11/20: Procedure:   Median Sternotomy Extracorporeal circulation 3.   Coronary artery bypass grafting x 2   Left internal mammary artery graft to the LAD SVG  to PD     4.   Endoscopic vein harvest from the right leg 5.   Aortic valve replacement using a 25 mm Edwards INSPIRIS RESILIA pericardial valve (model 11500A, SN [id number])  EKG:  EKG is  ordered today.  The ekg ordered today demonstrates SR, anterior infarct, LAFB, PVCs, HR 74  Recent Labs: 11/05/2020: TSH 0.185 11/08/2020: ALT 24; B Natriuretic Peptide 1,095.9 11/11/2020: Magnesium 1.8 11/16/2020: Hemoglobin 9.3; Platelets 252 01/18/2021: BUN 12; Creatinine, Ser 0.79; NT-Pro BNP 3,251; Potassium 4.1; Sodium 143  Recent Lipid Panel    Component Value Date/Time   CHOL 106 04/12/2016 1423   TRIG 109.0 04/12/2016 1423   HDL 27.50 (L) 04/12/2016 1423   CHOLHDL 4 04/12/2016 1423   VLDL 21.8 04/12/2016 1423   LDLCALC 57 04/12/2016 1423     Risk Assessment/Calculations:    CHA2DS2-VASc Score = 7  This indicates a 11.2% annual risk of stroke. The patient's score is based upon: CHF History: Yes HTN History: Yes Diabetes History: Yes Stroke History: Yes Vascular Disease History: Yes Age Score: 1 Gender Score: 0    Physical Exam:    VS:  BP 132/70 (BP Location: Left Arm, Patient Position: Sitting, Cuff Size: Normal)   Pulse 74   Ht '5\' 9"'  (1.753 m)   Wt 245 lb (111.1 kg)   SpO2 96%   BMI 36.18 kg/m     Wt Readings from Last 3 Encounters:  01/29/21 245 lb (111.1 kg)  01/16/21 254 lb 6.6 oz (115.4 kg)  12/26/20 245 lb (111.1 kg)     GEN: Elderly male, comfortable HEENT: Normal NECK: No JVD; No carotid bruits CARDIAC: RRR, 2/6 systolic murmur RESPIRATORY:  Scattered expiratory wheezing ABDOMEN: Soft, non-tender, non-distended MUSCULOSKELETAL:  1+ pitting edema to  mid-shin SKIN: Warm and dry NEUROLOGIC:  Alert and oriented x 3 PSYCHIATRIC:  Normal affect   ASSESSMENT:    1. Acute on chronic combined systolic (congestive) and diastolic (congestive) heart failure ([place])   2. Medication management   3. Coronary artery disease involving coronary bypass graft of native heart  without angina pectoris   4. Essential hypertension   5. Leg edema   6. S/P CABG (coronary artery bypass graft)   7. Type 2 diabetes mellitus with other circulatory complication, with long-term current use of insulin ([place])   8. Acute on chronic combined systolic and diastolic congestive heart failure (HCC)   9. Persistent atrial fibrillation ([place])   10. S/P AVR   11. PVC (premature ventricular contraction)    PLAN:    In order of problems listed above:  #Acute on chronic combined systolic and diastolic heart failure: TTE 09/06/20 with LVEF 45-50%. Had worsening LE edema, weight gain and SOB in the setting of dietary noncompliance for which his lasix was increased with significant improvement. Wt down 10lbs. Still mildly volume up with NYHA class II symptoms.  -Stop lasix -Start torsemide 68m qAM, 217mqPM x3 days and then 4059maily thereafter -Repeat BMET next weak -Continue coreg 12.5mg61mD and losartan 50mg14mly  -Spent a lot to time emphasizing the importance of a low Na diet and avoiding take out food; ideally, would like him to adhere to <1500mg 56my as he is very salt sensitive -Monitor daily weights and call if gaining >3lbs in a couple of days -Okay to resume cardiac rehab -Refer to pharm D for medication titration (goal to start SGLT2i, change losartan to entresto, and start spiro as able)  #Severe Aortic Stenosis s/p Aortic valve replacement: Patient found to have severe AS on cath that was being performed for progressive angina and chest pain. TTE with Aortic valve area, by VTI 0.67 cm. Aortic valve mean gradient measures 35.0 mmHg now s/p AVR with 25 mm Edwards INSPIRIS RESILIA pericardial valve. TTE 11/08/20 with mean gradient 10mmHg37mak gradient 21.2mmHg; 83mAR.  -SBE ppx -Continue TTE monitoring; mean gradient 10mmHg o50m/2022    #Multivessel CAD s/p CABG: Patient presented to clinic in 09/2020 with progressive angina found to have multivessel CAD and severe AS  on coronary angiography as detailed above. Now s/p two-vessel bypass LIMA to LAD and SVG to PDA. No anginal symptoms but overloaded on examinatin --Continue ASA 81mg dail55montinue atorvastatin 40 mg --Continue coreg 6.25mg BID -23mtinue losartan 50mg daily 83may to resume cardiac rehab   #Persistent atrial fibrillation s/p Left atrial appendage clipping In normal sinus rhythm today. Tolerating apixaban and coreg.  -Continue coreg and apixaban 5mg BID for 49m-Would continue apixaban despite LAA clipping due to very elevated CHADs-vasc score  #Frequent PVCs: Asymptomatic and revascularized. TTE 11/2020 with LVEF 55%.  -Continue coreg 6.25mg BID as a48m   #Postop anemia -Continue iron and B12 supplementation   #History of CVA: -Continue aspirin 81mg daily, ap62man 5mg BID and ato70mstatin 40mg daily as ab28m   Medication Adjustments/Labs and Tests Ordered: Current medicines are reviewed at length with the patient today.  Concerns regarding medicines are outlined above.  Orders Placed This Encounter  Procedures   Basic metabolic panel   AMB Referral to [facility] Pharm-D   EKG 12-Lead    Meds ordered this encounter  Medications   torsemide (DEMADEX) 20 MG tablet    Sig: Take 40 mg by mouth in the AM, then 40 mg by  mouth in the PM for 3 days only, then decrease to taking 40 mg by mouth daily thereafter.    Dispense:  72 tablet    Refill:  0     Patient Instructions  Medication Instructions:   STOP TAKING LASIX AFTER TONIGHT  START TAKING TORSEMIDE (ON 01/30/21) 40 MG BY MOUTH IN THE AM, THEN TAKE 40 MG BY MOUTH IN THE PM FOR 3 DAYS ONLY, THEN DECREASE TO TAKING 40 MG BY MOUTH DAILY THEREAFTER.  *If you need a refill on your cardiac medications before your next appointment, please call your pharmacy*   Lab Work:  [place] OFFICE--BMET  If you have labs (blood work) drawn today and your tests are completely normal, you will receive your results only  by: [place] (if you have MyChart) OR A paper copy in the mail If you have any lab test that is abnormal or we need to change your treatment, we will call you to review the results.   You have been referred to [place] [facility]   Follow-Up:  3 MONTHS IN THE OFFICE WITH AN EXTENDER--SCHEDULING PLEASE LOOK AT Laurann Montana NP SCHEDULE AT [place]    Signed, Freada Bergeron, MD  01/29/2021 1:55 PM    [facility]

## 2021-01-29 ENCOUNTER — Other Ambulatory Visit: Payer: Self-pay

## 2021-01-29 ENCOUNTER — Ambulatory Visit (INDEPENDENT_AMBULATORY_CARE_PROVIDER_SITE_OTHER): Payer: Medicare Other | Admitting: Cardiology

## 2021-01-29 ENCOUNTER — Encounter: Payer: Self-pay | Admitting: Cardiology

## 2021-01-29 VITALS — BP 132/70 | HR 74 | Ht 69.0 in | Wt 245.0 lb

## 2021-01-29 DIAGNOSIS — Z79899 Other long term (current) drug therapy: Secondary | ICD-10-CM | POA: Diagnosis not present

## 2021-01-29 DIAGNOSIS — Z951 Presence of aortocoronary bypass graft: Secondary | ICD-10-CM

## 2021-01-29 DIAGNOSIS — I10 Essential (primary) hypertension: Secondary | ICD-10-CM | POA: Diagnosis not present

## 2021-01-29 DIAGNOSIS — I25810 Atherosclerosis of coronary artery bypass graft(s) without angina pectoris: Secondary | ICD-10-CM

## 2021-01-29 DIAGNOSIS — I493 Ventricular premature depolarization: Secondary | ICD-10-CM

## 2021-01-29 DIAGNOSIS — I5043 Acute on chronic combined systolic (congestive) and diastolic (congestive) heart failure: Secondary | ICD-10-CM

## 2021-01-29 DIAGNOSIS — R600 Localized edema: Secondary | ICD-10-CM

## 2021-01-29 DIAGNOSIS — I4819 Other persistent atrial fibrillation: Secondary | ICD-10-CM

## 2021-01-29 DIAGNOSIS — Z952 Presence of prosthetic heart valve: Secondary | ICD-10-CM

## 2021-01-29 DIAGNOSIS — E1159 Type 2 diabetes mellitus with other circulatory complications: Secondary | ICD-10-CM

## 2021-01-29 DIAGNOSIS — Z794 Long term (current) use of insulin: Secondary | ICD-10-CM

## 2021-01-29 MED ORDER — TORSEMIDE 20 MG PO TABS: ORAL_TABLET | ORAL | 0 refills | Status: DC

## 2021-01-29 NOTE — Telephone Encounter (Signed)
Pt was seen in clinic today by Dr. Shari Prows.  See OV note for further details.  Will send Byrd Hesselbach at Cardiac Rehab a message to review OV note about pt coming back to resume their services.

## 2021-01-29 NOTE — Patient Instructions (Signed)
Medication Instructions:   STOP TAKING LASIX AFTER TONIGHT  START TAKING TORSEMIDE (ON 01/30/21) 40 MG BY MOUTH IN THE AM, THEN TAKE 40 MG BY MOUTH IN THE PM FOR 3 DAYS ONLY, THEN DECREASE TO TAKING 40 MG BY MOUTH DAILY THEREAFTER.  *If you need a refill on your cardiac medications before your next appointment, please call your pharmacy*   Lab Work:  IN ONE WEEK HERE IN THE OFFICE--BMET  If you have labs (blood work) drawn today and your tests are completely normal, you will receive your results only by: MyChart Message (if you have MyChart) OR A paper copy in the mail If you have any lab test that is abnormal or we need to change your treatment, we will call you to review the results.   You have been referred to OUR PHARMACIST HERE IN THE CLINIC FOR FURTHER MED MANAGEMENT   Follow-Up:  3 MONTHS IN THE OFFICE WITH AN EXTENDER--SCHEDULING PLEASE LOOK AT CAITLIN WALKER NP SCHEDULE AT [place] LOCATION IF NEEDED FOR APPOINTMENT

## 2021-01-30 ENCOUNTER — Encounter: Payer: Self-pay | Admitting: *Deleted

## 2021-01-31 ENCOUNTER — Encounter (HOSPITAL_COMMUNITY)
Admission: RE | Admit: 2021-01-31 | Discharge: 2021-01-31 | Disposition: A | Discharge status: Home / Self Care | Payer: No Typology Code available for payment source | Source: Ambulatory Visit | Attending: Cardiology | Admitting: Cardiology

## 2021-01-31 ENCOUNTER — Other Ambulatory Visit: Payer: Self-pay

## 2021-01-31 DIAGNOSIS — Z951 Presence of aortocoronary bypass graft: Secondary | ICD-10-CM

## 2021-01-31 DIAGNOSIS — Z952 Presence of prosthetic heart valve: Secondary | ICD-10-CM

## 2021-01-31 LAB — GLUCOSE, CAPILLARY
Glucose-Capillary: 114 mg/dL — ABNORMAL HIGH (ref 70–99)
Glucose-Capillary: 98 mg/dL (ref 70–99)

## 2021-01-31 NOTE — Progress Notes (Signed)
Mason Patton returned to exercise at cardiac rehab. Mason Patton's weight is 109.6 kg which is down 6.9 kg from his last exercise session on 01/26/21. [name] reports feeling much better. Decreased pedal and abdominal edema noted today. Exit BP 122/68. Heart rate 74. Will continue to monitor the patient throughout  the program. Gladstone Lighter, RN,BSN 01/31/2021 12:01 PM

## 2021-02-02 ENCOUNTER — Encounter (HOSPITAL_COMMUNITY): Payer: No Typology Code available for payment source

## 2021-02-05 ENCOUNTER — Other Ambulatory Visit: Payer: Medicare Other | Admitting: *Deleted

## 2021-02-05 ENCOUNTER — Encounter (HOSPITAL_COMMUNITY)
Admission: RE | Admit: 2021-02-05 | Discharge: 2021-02-05 | Disposition: A | Discharge status: Home / Self Care | Payer: No Typology Code available for payment source | Source: Ambulatory Visit | Attending: Cardiology | Admitting: Cardiology

## 2021-02-05 ENCOUNTER — Other Ambulatory Visit: Payer: Self-pay

## 2021-02-05 DIAGNOSIS — Z951 Presence of aortocoronary bypass graft: Secondary | ICD-10-CM | POA: Diagnosis present

## 2021-02-05 DIAGNOSIS — R600 Localized edema: Secondary | ICD-10-CM

## 2021-02-05 DIAGNOSIS — I5043 Acute on chronic combined systolic (congestive) and diastolic (congestive) heart failure: Secondary | ICD-10-CM

## 2021-02-05 DIAGNOSIS — Z79899 Other long term (current) drug therapy: Secondary | ICD-10-CM

## 2021-02-05 DIAGNOSIS — Z794 Long term (current) use of insulin: Secondary | ICD-10-CM

## 2021-02-05 DIAGNOSIS — I25810 Atherosclerosis of coronary artery bypass graft(s) without angina pectoris: Secondary | ICD-10-CM

## 2021-02-05 DIAGNOSIS — Z952 Presence of prosthetic heart valve: Secondary | ICD-10-CM | POA: Diagnosis present

## 2021-02-05 DIAGNOSIS — I10 Essential (primary) hypertension: Secondary | ICD-10-CM

## 2021-02-05 DIAGNOSIS — E1159 Type 2 diabetes mellitus with other circulatory complications: Secondary | ICD-10-CM

## 2021-02-05 LAB — BASIC METABOLIC PANEL
BUN/Creatinine Ratio: 19 (ref 10–24)
BUN: 18 mg/dL (ref 8–27)
CO2: 26 mmol/L (ref 20–29)
Calcium: 9.6 mg/dL (ref 8.6–10.2)
Chloride: 99 mmol/L (ref 96–106)
Creatinine, Ser: 0.93 mg/dL (ref 0.76–1.27)
Glucose: 173 mg/dL — ABNORMAL HIGH (ref 65–99)
Potassium: 3.8 mmol/L (ref 3.5–5.2)
Sodium: 142 mmol/L (ref 134–144)
eGFR: 86 mL/min/{1.73_m2} (ref >59)

## 2021-02-05 NOTE — Progress Notes (Signed)
Reviewed home exercise guidelines with patient including endpoints, temperature precautions, target heart rate and rate of perceived exertion. Patient is currently walking 10 minutes most days as his mode of home exercise. Discussed increasing duration from 10 minutes once/day to 10 minutes 2-3 times 2 days/week, and patient is agreeable to this. Patient voices understanding of instructions given.  Artist Pais, MS, ACSM CEP

## 2021-02-07 ENCOUNTER — Other Ambulatory Visit: Payer: Self-pay

## 2021-02-07 ENCOUNTER — Encounter (HOSPITAL_COMMUNITY)
Admission: RE | Admit: 2021-02-07 | Discharge: 2021-02-07 | Disposition: A | Discharge status: Home / Self Care | Payer: No Typology Code available for payment source | Source: Ambulatory Visit | Attending: Cardiology | Admitting: Cardiology

## 2021-02-07 DIAGNOSIS — Z951 Presence of aortocoronary bypass graft: Secondary | ICD-10-CM | POA: Diagnosis not present

## 2021-02-07 DIAGNOSIS — Z952 Presence of prosthetic heart valve: Secondary | ICD-10-CM

## 2021-02-07 LAB — GLUCOSE, CAPILLARY: Glucose-Capillary: 155 mg/dL — ABNORMAL HIGH (ref 70–99)

## 2021-02-09 ENCOUNTER — Encounter (HOSPITAL_COMMUNITY)
Admission: RE | Admit: 2021-02-09 | Discharge: 2021-02-09 | Disposition: A | Discharge status: Home / Self Care | Payer: No Typology Code available for payment source | Source: Ambulatory Visit | Attending: Cardiology | Admitting: Cardiology

## 2021-02-09 ENCOUNTER — Other Ambulatory Visit: Payer: Self-pay

## 2021-02-09 DIAGNOSIS — Z952 Presence of prosthetic heart valve: Secondary | ICD-10-CM

## 2021-02-09 DIAGNOSIS — Z951 Presence of aortocoronary bypass graft: Secondary | ICD-10-CM

## 2021-02-12 ENCOUNTER — Encounter (HOSPITAL_COMMUNITY)
Admission: RE | Admit: 2021-02-12 | Discharge: 2021-02-12 | Disposition: A | Discharge status: Home / Self Care | Payer: No Typology Code available for payment source | Source: Ambulatory Visit | Attending: Cardiology | Admitting: Cardiology

## 2021-02-12 ENCOUNTER — Other Ambulatory Visit: Payer: Self-pay | Admitting: *Deleted

## 2021-02-12 ENCOUNTER — Other Ambulatory Visit: Payer: Self-pay

## 2021-02-12 DIAGNOSIS — Z951 Presence of aortocoronary bypass graft: Secondary | ICD-10-CM | POA: Diagnosis not present

## 2021-02-12 DIAGNOSIS — Z952 Presence of prosthetic heart valve: Secondary | ICD-10-CM

## 2021-02-12 LAB — GLUCOSE, CAPILLARY
Glucose-Capillary: 127 mg/dL — ABNORMAL HIGH (ref 70–99)
Glucose-Capillary: 130 mg/dL — ABNORMAL HIGH (ref 70–99)

## 2021-02-12 MED ORDER — LOSARTAN POTASSIUM 50 MG PO TABS: 50.0000 mg | ORAL_TABLET | Freq: Every day | ORAL | 3 refills | Status: DC

## 2021-02-12 MED ORDER — EMPAGLIFLOZIN 10 MG PO TABS: 10.0000 mg | ORAL_TABLET | Freq: Every day | ORAL | 3 refills | Status: DC

## 2021-02-12 NOTE — Progress Notes (Signed)
RX for Losartan 50 mg tablets to take 1 po QD sent electronically to [place], [place].

## 2021-02-14 ENCOUNTER — Encounter (HOSPITAL_COMMUNITY)
Admission: RE | Admit: 2021-02-14 | Discharge: 2021-02-14 | Disposition: A | Discharge status: Home / Self Care | Payer: No Typology Code available for payment source | Source: Ambulatory Visit | Attending: Cardiology | Admitting: Cardiology

## 2021-02-14 ENCOUNTER — Other Ambulatory Visit: Payer: Self-pay

## 2021-02-14 DIAGNOSIS — Z952 Presence of prosthetic heart valve: Secondary | ICD-10-CM

## 2021-02-14 DIAGNOSIS — Z951 Presence of aortocoronary bypass graft: Secondary | ICD-10-CM

## 2021-02-16 ENCOUNTER — Encounter (HOSPITAL_COMMUNITY)
Admission: RE | Admit: 2021-02-16 | Discharge: 2021-02-16 | Disposition: A | Discharge status: Home / Self Care | Payer: No Typology Code available for payment source | Source: Ambulatory Visit | Attending: Cardiology | Admitting: Cardiology

## 2021-02-16 ENCOUNTER — Other Ambulatory Visit: Payer: Self-pay

## 2021-02-16 VITALS — BP 100/60 | HR 74 | Ht 69.0 in | Wt 237.2 lb

## 2021-02-16 DIAGNOSIS — Z952 Presence of prosthetic heart valve: Secondary | ICD-10-CM

## 2021-02-16 DIAGNOSIS — Z951 Presence of aortocoronary bypass graft: Secondary | ICD-10-CM

## 2021-02-16 NOTE — Progress Notes (Signed)
Discharge Progress Report  Patient Details  Name: Mason Patton MRN: [id number] Date of Birth: [birth year] Referring Provider:   Flowsheet Row CARDIAC REHAB PHASE II ORIENTATION from 01/16/2021 in [facility]  Referring Provider Freada Bergeron, MD        Number of Visits: 9  Reason for Discharge:  Patient reached a stable level of exercise. 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)  ADL UCSD:   Initial Exercise Prescription:  Initial Exercise Prescription - 01/16/21 1500       Date of Initial Exercise RX and Referring Provider   Date 01/16/21    Referring Provider Freada Bergeron, MD    Expected Discharge Date 03/16/21      NuStep   Level 2    SPM 85    Minutes 15    METs 1.8      Arm Ergometer   Level 1.5    Watts 20    Minutes 15    METs 1.9      Prescription Details   Frequency (times per week) 3    Duration Progress to 30 minutes of continuous aerobic without signs/symptoms of physical distress      Intensity   THRR 40-80% of Max Heartrate 58-116    Ratings of Perceived Exertion 11-13    Perceived Dyspnea 0-4      Progression   Progression Continue to progress workloads to maintain intensity without signs/symptoms of physical distress.      Resistance Training   Training Prescription Yes    Weight 3 lbs    Reps 10-15             Discharge Exercise Prescription (Final Exercise Prescription Changes):  Exercise Prescription Changes - 02/16/21 1058       Response to Exercise   Blood Pressure (Admit) 100/60    Blood Pressure (Exercise) 128/62    Blood Pressure (Exit) 126/58    Heart Rate (Admit) 74 bpm    Heart Rate  (Exercise) 102 bpm    Heart Rate (Exit) 68 bpm    Rating of Perceived Exertion (Exercise) 12    Symptoms none    Comments Patient completed the cardiac rehab program.    Duration Continue with 30 min of aerobic exercise without signs/symptoms of physical distress.    Intensity THRR unchanged      Progression   Progression Continue to progress workloads to maintain intensity without signs/symptoms of physical distress.    Average METs 2.1      Resistance Training   Training Prescription No      Interval Training   Interval Training No      NuStep   Level 5    SPM 80    Minutes 17    METs 1.8      Track   Laps 5   1000 feet   Minutes 6   Walk test   METs 2.45      Home Exercise Plan   Plans to continue exercise at Home (comment)   Walking   Frequency Add 2 additional days to program exercise sessions.    Initial Home Exercises Provided 02/05/21  Functional Capacity:  6 Minute Walk     Row Name 01/16/21 1342 02/16/21 1055       6 Minute Walk   Phase Initial Discharge    Distance 800 feet 1000 feet    Distance % Change -- 25 %    Distance Feet Change -- 200 ft    Walk Time 6 minutes 6 minutes    # of Rest Breaks 1 1    MPH 1.51 1.89    METS 1.5 1.67    RPE 11 12    Perceived Dyspnea  0 0    VO2 Peak 5.24 5.86    Symptoms Yes (comment) Yes (comment)    Comments Patient c/o fatigue, seated rest break x1 for 1 minute, 19 seconds taken. Patient c/o right knee pain, chronic, which he rated a 4-5/10 on the pain scale,seated rest break x1 for 37 seconds taken.    Resting HR 83 bpm 74 bpm    Resting BP 144/62 100/60    Resting Oxygen Saturation  98 % --    Exercise Oxygen Saturation  during 6 min walk 100 % --    Max Ex. HR 104 bpm 102 bpm    Max Ex. BP 168/64 128/62    2 Minute Post BP 148/70 126/58             Psychological, QOL, Others - Outcomes: PHQ 2/9: Depression screen [facility] 2/9 02/28/2021 01/22/2021 04/20/2013  Decreased Interest 0 0 0   Down, Depressed, Hopeless 0 0 0  PHQ - 2 Score 0 0 0  Altered sleeping - 0 -  Tired, decreased energy - 1 -  Change in appetite - 0 -  Feeling bad or failure about yourself  - 0 -  Trouble concentrating - 0 -  Moving slowly or fidgety/restless - 0 -  Suicidal thoughts - 0 -  PHQ-9 Score - 1 -  Difficult doing work/chores - Not difficult at all -    Quality of Life:  Quality of Life - 02/09/21 1618       Quality of Life   Select Quality of Life      Quality of Life Scores   Health/Function Pre 25.21 %    Health/Function Post 26.54 %    Health/Function % Change 5.28 %    Socioeconomic Pre 28.21 %    Socioeconomic Post 30 %    Socioeconomic % Change  6.35 %    Psych/Spiritual Pre 28.29 %    Psych/Spiritual Post 30 %    Psych/Spiritual % Change 6.04 %    Family Pre 30 %    Family Post 30 %    Family % Change 0 %    GLOBAL Pre 27.23 %    GLOBAL Post 28.48 %    GLOBAL % Change 4.59 %             Personal Goals: Goals established at orientation with interventions provided to work toward goal.  Personal Goals and Risk Factors at Admission - 01/16/21 1329       Core Components/Risk Factors/Patient Goals on Admission    Weight Management Yes;Obesity    Intervention Weight Management/Obesity: Establish reasonable short term and long term weight goals.;Obesity: Provide education and appropriate resources to help participant work on and attain dietary goals.    Admit Weight 254 lb 6.6 oz (115.4 kg)    Goal Weight: Short Term 244 lb (110.7 kg)    Goal Weight: Long Term 230 lb (104.3 kg)  Expected Outcomes Short Term: Continue to assess and modify interventions until short term weight is achieved;Long Term: Adherence to nutrition and physical activity/exercise program aimed toward attainment of established weight goal;Weight Loss: Understanding of general recommendations for a balanced deficit meal plan, which promotes 1-2 lb weight loss per week and includes a negative  energy balance of [phone] kcal/d;Understanding recommendations for meals to include 15-35% energy as protein, 25-35% energy from fat, 35-60% energy from carbohydrates, less than 275m of dietary cholesterol, 20-35 gm of total fiber daily;Understanding of distribution of calorie intake throughout the day with the consumption of 4-5 meals/snacks    Diabetes Yes    Intervention Provide education about signs/symptoms and action to take for hypo/hyperglycemia.;Provide education about proper nutrition, including hydration, and aerobic/resistive exercise prescription along with prescribed medications to achieve blood glucose in normal ranges: Fasting glucose 65-99 mg/dL    Expected Outcomes Short Term: Participant verbalizes understanding of the signs/symptoms and immediate care of hyper/hypoglycemia, proper foot care and importance of medication, aerobic/resistive exercise and nutrition plan for blood glucose control.;Long Term: Attainment of HbA1C < 7%.    Heart Failure Yes    Intervention Provide a combined exercise and nutrition program that is supplemented with education, support and counseling about heart failure. Directed toward relieving symptoms such as shortness of breath, decreased exercise tolerance, and extremity edema.    Expected Outcomes Improve functional capacity of life;Short term: Attendance in program 2-3 days a week with increased exercise capacity. Reported lower sodium intake. Reported increased fruit and vegetable intake. Reports medication compliance.;Short term: Daily weights obtained and reported for increase. Utilizing diuretic protocols set by physician.;Long term: Adoption of self-care skills and reduction of barriers for early signs and symptoms recognition and intervention leading to self-care maintenance.    Hypertension Yes    Intervention Provide education on lifestyle modifcations including regular physical activity/exercise, weight management, moderate sodium restriction and  increased consumption of fresh fruit, vegetables, and low fat dairy, alcohol moderation, and smoking cessation.;Monitor prescription use compliance.    Expected Outcomes Short Term: Continued assessment and intervention until BP is < 140/924mHG in hypertensive participants. < 130/8026mG in hypertensive participants with diabetes, heart failure or chronic kidney disease.;Long Term: Maintenance of blood pressure at goal levels.    Lipids Yes    Intervention Provide education and support for participant on nutrition & aerobic/resistive exercise along with prescribed medications to achieve LDL <28m48mDL >40mg65m Expected Outcomes Short Term: Participant states understanding of desired cholesterol values and is compliant with medications prescribed. Participant is following exercise prescription and nutrition guidelines.;Long Term: Cholesterol controlled with medications as prescribed, with individualized exercise RX and with personalized nutrition plan. Value goals: LDL < 28mg,96m > 40 mg.              Personal Goals Discharge:  Goals and Risk Factor Review     Row Name 01/24/21 1643 03/01/21 0802           Core Components/Risk Factors/Patient Goals Review   Personal Goals Review Weight Management/Obesity;Hypertension;Lipids;Diabetes Weight Management/Obesity;Hypertension;Lipids;Diabetes      Review Don stTimmothy Soursed exercise on 01/24/21 and did well with exercise. Vital signs and CBG's were stable. Don completed exercise at phase 2 cardiac rehab on 02/16/21.      Expected Outcomes Don wiTimmothy Sourscontinue to partcipate in phase 2 cardiac rehab for exercise, nutrition and lifestyle modifications Don wiTimmothy Sourscontinue to  exercise, nutrition and lifestyle modifications upon completion of phase 2 cardiac rehab.  Exercise Goals and Review:  Exercise Goals     Row Name 01/16/21 1308             Exercise Goals   Increase Physical Activity Yes       Intervention Provide advice,  education, support and counseling about physical activity/exercise needs.;Develop an individualized exercise prescription for aerobic and resistive training based on initial evaluation findings, risk stratification, comorbidities and participant's personal goals.       Expected Outcomes Short Term: Attend rehab on a regular basis to increase amount of physical activity.;Long Term: Exercising regularly at least 3-5 days a week.;Long Term: Add in home exercise to make exercise part of routine and to increase amount of physical activity.       Increase Strength and Stamina Yes       Intervention Provide advice, education, support and counseling about physical activity/exercise needs.;Develop an individualized exercise prescription for aerobic and resistive training based on initial evaluation findings, risk stratification, comorbidities and participant's personal goals.       Expected Outcomes Short Term: Increase workloads from initial exercise prescription for resistance, speed, and METs.;Short Term: Perform resistance training exercises routinely during rehab and add in resistance training at home;Long Term: Improve cardiorespiratory fitness, muscular endurance and strength as measured by increased METs and functional capacity (6MWT)       Able to understand and use rate of perceived exertion (RPE) scale Yes       Intervention Provide education and explanation on how to use RPE scale       Expected Outcomes Short Term: Able to use RPE daily in rehab to express subjective intensity level;Long Term:  Able to use RPE to guide intensity level when exercising independently       Knowledge and understanding of Target Heart Rate Range (THRR) Yes       Intervention Provide education and explanation of THRR including how the numbers were predicted and where they are located for reference       Expected Outcomes Short Term: Able to state/look up THRR;Long Term: Able to use THRR to govern intensity when exercising  independently;Short Term: Able to use daily as guideline for intensity in rehab       Understanding of Exercise Prescription Yes       Intervention Provide education, explanation, and written materials on patient's individual exercise prescription       Expected Outcomes Short Term: Able to explain program exercise prescription;Long Term: Able to explain home exercise prescription to exercise independently                Exercise Goals Re-Evaluation:  Exercise Goals Re-Evaluation     Row Name 01/22/21 1159 02/05/21 1123 02/16/21 1126         Exercise Goal Re-Evaluation   Exercise Goals Review Increase Physical Activity;Able to understand and use rate of perceived exertion (RPE) scale Increase Physical Activity;Able to understand and use rate of perceived exertion (RPE) scale;Increase Strength and Stamina;Knowledge and understanding of Target Heart Rate Range (THRR);Able to check pulse independently;Understanding of Exercise Prescription Increase Physical Activity;Able to understand and use rate of perceived exertion (RPE) scale;Increase Strength and Stamina;Knowledge and understanding of Target Heart Rate Range (THRR);Able to check pulse independently;Understanding of Exercise Prescription     Comments Patient able to understand and use RPE scale appropriately. Reviewed home exercise guidelines with patient. Patient states he walks about 10 minutes most days. Discussed increasing duration from 10 minutes once/day to 10 minutes 2-3 times 2 days/week, and patient  is agreeable to this. Patient states he knows how to check his pulse. The arm ergometer was difficult for patient, and he would prefer to stay on the NuStep machine. Will change exercise prescription to NuStep 30 minutes. Patient completed the cardiac rehab program and made gradual progress. Patient's functional capacity increased 25% as measured by 6 minute walk test. Patient plans to walk as tolerated but walking is limited by knee pain.  Discussed walking short duration, ie 10 mintues a few times/day as able. Patient may look into joining a local fitness center or get a recumbent stepper for home use.     Expected Outcomes Progress workloads as tolerated to help increase strength and stamina. Patient will increase walking duration at home from 10 minutes once/day to 10 minutes 2-3 times/day, 1-2 days/week to help increase stamina and strength. Patient will walk as tolerated to help maintain health and fitness gains. Patient may also join a fitness center or get exercise equipment for home use.              Nutrition & Weight - Outcomes:  Pre Biometrics - 01/16/21 1307       Pre Biometrics   Waist Circumference 44.5 inches    Hip Circumference 45 inches    Waist to Hip Ratio 0.99 %    Triceps Skinfold 25 mm    % Body Fat 35.6 %    Grip Strength 34.5 kg    Flexibility --   N/A, bilateral knee replacement.   Single Leg Stand 4 seconds             Post Biometrics - 02/16/21 1056        Post  Biometrics   Height _0  (1.753 m)    Waist Circumference 44 inches    Hip Circumference 44.75 inches    Waist to Hip Ratio 0.98 %    Triceps Skinfold 23 mm    % Body Fat 34 %    Grip Strength 28 kg   Different dynamometer use.   Flexibility --   N/A, bilateral knee replacement.   Single Leg Stand 6.18 seconds             Nutrition:  Nutrition Therapy & Goals - 01/26/21 1217       Nutrition Therapy   Diet TLC      Personal Nutrition Goals   Nutrition Goal Pt to reduce sodium intake to 1500 mg/day by avoiding eating out twice in one day    Personal Goal #2 Pt to avoid fried foods and processed meats      Intervention Plan   Intervention Nutrition handout(s) given to patient.;Prescribe, educate and counsel regarding individualized specific dietary modifications aiming towards targeted core components such as weight, hypertension, lipid management, diabetes, heart failure and other comorbidities.    Expected  Outcomes Long Term Goal: Adherence to prescribed nutrition plan.;Short Term Goal: Understand basic principles of dietary content, such as calories, fat, sodium, cholesterol and nutrients.             Nutrition Discharge:   Education Questionnaire Score:  Knowledge Questionnaire Score - 02/09/21 1616       Knowledge Questionnaire Score   Pre Score 23/28    Post Score 27/28             Goals reviewed with patient; copy given to patient.Don graduated from cardiac rehab program on 02/16/21 with completion of 9 exercise sessions in Phase II. Pt maintained good attendance and progressed fairly during his  participation in rehab as evidenced by increased MET level. . Repeat  PHQ score-  0.  . Pt feels he has achieved his goals during cardiac rehab.   Pt plans to continue exercise by walking. Don increased his distance on his post exercise walk test by 200 feet. Timmothy Sours continues to smoke and does not plan to quit. Don did report participating in cardiac rehab was helpful and he felt better upon completion of the program. Harrell Gave RN BSN

## 2021-02-19 ENCOUNTER — Ambulatory Visit (INDEPENDENT_AMBULATORY_CARE_PROVIDER_SITE_OTHER): Payer: Medicare Other | Admitting: Pharmacist

## 2021-02-19 ENCOUNTER — Encounter (HOSPITAL_COMMUNITY): Payer: No Typology Code available for payment source

## 2021-02-19 ENCOUNTER — Other Ambulatory Visit: Payer: Self-pay

## 2021-02-19 DIAGNOSIS — I209 Angina pectoris, unspecified: Secondary | ICD-10-CM | POA: Diagnosis not present

## 2021-02-19 DIAGNOSIS — I5032 Chronic diastolic (congestive) heart failure: Secondary | ICD-10-CM

## 2021-02-19 DIAGNOSIS — I5030 Unspecified diastolic (congestive) heart failure: Secondary | ICD-10-CM | POA: Insufficient documentation

## 2021-02-19 MED ORDER — ENTRESTO 49-51 MG PO TABS: 1.0000 | ORAL_TABLET | Freq: Two times a day (BID) | ORAL | 5 refills | Status: AC

## 2021-02-19 MED ORDER — CARVEDILOL 12.5 MG PO TABS: 12.5000 mg | ORAL_TABLET | Freq: Two times a day (BID) | ORAL | 3 refills | Status: DC

## 2021-02-19 NOTE — Patient Instructions (Addendum)
It was nice to meet you today  Your blood pressure goal is < 130/62mmHg  Monitor your blood pressure and your weight at home  Aim for less than 4000mg  of daily sodium  I'm sending in a prescription for Entresto to the [place] to see if they'll cover it. This would replace your losartan medication. Sherryll Burger is taken twice daily, your first dose would be given 24 hours after your last losartan dose

## 2021-02-19 NOTE — Progress Notes (Signed)
Patient ID: Mason Patton                 DOB: July 03, 1946                      MRN: [id number]     HPI: Mason Patton is a 75 y.o. male referred by Dr. Johney Frame to pharmacy clinic for HF medication management. 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 Most recent LVEF 60-65% on 11/16/20, improved from 45-50% on 09/07/20 cath.  At last visit with Dr Johney Frame, patient overall felt better after prior episodes of worsening DOE and LR edema due to dietary non-compliance. He was changed from Lasix to torsemide 40 mg qAM, 22m qPM x 3 days then 40 mg daily thereafter. Diet and importance of low salt was emphasized. BMET checked 1 week after loop diuretic change. Labs were stable.  Pt presents today in good spirits accompanied by his wife. Symptomatically, he is feeling no dizziness, lightheadedness, or fatigue currently. He gets dizzy when his BP drops, but this has not been an issue since stopping amlodipine and decreasing the losartan dose to 533m Patient had bi/trigemeny when last seen in the office. No chest pain or palpitations. Does not feel SOB. Able to complete all ADLs. Activity level completed cardiac rehab and is looking into gym membership. He sometimes checks his weight at home (this morning in the 230s lbs). No LEE, PND, or orthopnea. Appetite has been stable. He has difficulty adhering to a low-salt diet, but his wife has been cooking for him to try and limit salt and he has been more compliant since his visit with Dr. PeJohney Frame  [place] [facility] [place] [facility] [place]. Tolerating medication well. Notes he has had increased UOP since the diuretic change from Lasix to  torsemide and his weight has been decreasing with this change and LEE has improved. Has a BP cuff at home but has not been checking recently.  He is still smoking 1/2 PPD. He has tried to quit numerous times in the past (nicotine patches, Chantix, nicotine inhalers), but has been unsuccessful. He was counseled on the cardiac benefits of quitting, but is not ready to quit at this time.   Current CHF meds:  Coreg 12.5 mg BID Losartan 50 mg daily Jardiance 1036maily Torsemide 40 mg daily  BP goal: < 130/80 mmHg  Family History:  Maternal aunt: cancer Maternal grandmother: DM Father: HLD Mother: thyroid disease Child: T1DM, asthma  Social History:  smoking 0.5 ppd x 50 years Numerous unsuccessful quit attempts   Diet:  Eats a lot of takeout Goal < 1500 mg sodium/day Wife cooks for him a lot Has been much better about adherence to sodium restriction   Exercise:  CV rehab completed Looking into gym memberships now  Likes to walk in the woods, but feels unstable and afraid he will fall  Labs: 02/05/2021: Scr 0.93, K 3.8 Wt Readings from Last 3 Encounters:  02/16/21 237 lb 3.4 oz (107.6 kg)  01/29/21 245 lb (111.1 kg)  01/16/21 254 lb 6.6 oz (115.4 kg)   BP Readings from Last 3 Encounters:  02/16/21 100/60  01/29/21 132/70  01/16/21 (!)[name]  144/62   Pulse Readings from Last 3 Encounters:  02/16/21 74  01/29/21 74  01/16/21 83    Renal function: Estimated Creatinine Clearance: 83 mL/min (by C-G formula based on SCr of 0.93 mg/dL).  Past Medical History:  Diagnosis Date   Aortic stenosis    09/30/19 echo ([facility], Cardiologist Dr. 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" (05/19/2013)   Meningoencephalitis    Obesity    OSA on CPAP    last sleep study >8 yrs   Pneumonia    "twice" (05/19/2013)   Shortness of breath    Stroke [facility])    "they said I had a minor one when I had menigitis" (05/19/2013)   Thyroid disease    Tobacco abuse    quit 11/08/2012   Type II diabetes mellitus ([place])    Umbilical hernia     Current Outpatient Medications on File Prior to Visit  Medication Sig Dispense Refill   acetaminophen (TYLENOL) 325 MG tablet Take 2 tablets (650 mg total) by mouth every 4 (four) hours as needed for headache or mild pain.     amoxicillin (AMOXIL) 500 MG tablet Take 4 tablets by mouth 30-60 minutes prior to your dental cleaning 4 tablet 2   apixaban (ELIQUIS) 5 MG TABS tablet Take 5 mg by mouth 2 (two) times daily.     aspirin EC 81 MG EC tablet Take 1 tablet (81 mg total) by mouth daily. Swallow whole. 30 tablet 11   atorvastatin (LIPITOR) 40 MG tablet Take 40 mg by mouth at bedtime. Reported on 01/11/2016     Blood Glucose Monitoring Suppl (ONETOUCH VERIO FLEX SYSTEM) W/DEVICE KIT 1 each by Does not apply route daily. Dx: E11.9 1 kit 0   carvedilol (COREG) 12.5 MG tablet Take 1 tablet (12.5 mg total) by mouth 2 (two) times daily with a meal. 60 tablet 1   cholecalciferol (VITAMIN D3) 25 MCG (1000 UNIT) tablet Take 1,000 Units by mouth daily.     Cyanocobalamin (VITAMIN B12) 500 MCG TABS Take 500 mcg by mouth 2 (two) times daily.     empagliflozin (JARDIANCE) 10 MG TABS tablet Take 1 tablet (10 mg total) by mouth daily before breakfast. 90 tablet 3   ferrous sulfate 325 (65 FE) MG tablet Take 325 mg by mouth 3 (three) times a week.     glipiZIDE (GLUCOTROL) 10 MG tablet Take 10 mg by mouth 2 (two) times daily  before a meal.     glucose blood (ONETOUCH VERIO) test strip Use to test blood sugar 2-3 times daily as instructed. Dx: E11.9 200 each 3   HYDROcodone-acetaminophen (NORCO/VICODIN) 5-325 MG tablet Take 1 tablet by mouth every 6 (six) hours as needed (for pain).     levothyroxine (SYNTHROID) 200 MCG tablet Take 1 tablet (200 mcg total) by mouth daily. 30 tablet 0   losartan (COZAAR) 50 MG tablet Take 1  tablet (50 mg total) by mouth daily. 90 tablet 3   metFORMIN (GLUCOPHAGE) 500 MG tablet Take 1,000 mg by mouth 2 (two) times daily with a meal.     Multiple Vitamins-Minerals (CENTRUM SILVER 50+MEN) TABS Take 1 tablet by mouth daily with breakfast.     Multiple Vitamins-Minerals (PRESERVISION AREDS PO) Take 1 capsule by mouth 2 (two) times daily.     nitroGLYCERIN (NITROSTAT) 0.4 MG SL tablet Place 0.4 mg under the tongue every 5 (five) minutes as needed.     omeprazole (PRILOSEC) 20 MG capsule Take 20 mg by mouth daily before breakfast.     ONETOUCH DELICA LANCETS FINE MISC Use to test blood sugar 2-3 times daily as instructed. Dx: E11.9 200 each 3   Pseudoephedrine-guaiFENesin (MUCINEX D PO) Take 1 tablet by mouth every 12 (twelve) hours.     torsemide (DEMADEX) 20 MG tablet Take 40 mg by mouth in the AM, then 40 mg by mouth in the PM for 3 days only, then decrease to taking 40 mg by mouth daily thereafter. 72 tablet 0   VENTOLIN HFA 108 (90 Base) MCG/ACT inhaler Inhale 1-2 puffs into the lungs every 4 (four) hours as needed for wheezing or shortness of breath.      No current facility-administered medications on file prior to visit.    Allergies  Allergen Reactions   Codeine Nausea And Vomiting   Demerol Nausea And Vomiting     Assessment/Plan:  1. Diastolic CHF -  BP above goal of < 130/66mHg in clinic with room to further optimize CHF regimen. Will switch from losartan 5102mdaily to Entresto 49/51 mg BID for further BP lowering and CV benefit with EF recovered to 60%. Will continue  carvedilol 12.40m74mID (HR 65), Jardiance 16m16mily, and torsemide 40mg56mly. Will need to re-evaluate at future appointments to see if able to cut back on diuretic dose once the patient is euvolemic. Will follow up with the patient on Thursday via phone to determine if [facility] will [facility] Entresto and schedule f/u at that time. If [facility] does not cover, will start spironolactone instead. Patient advised to keep a log of his weights, BP, and salt intake to bring to future visits.    Megan E. Supple, PharmD, BCACP, CPP [place] [id number]: [phone]: [phone]/2022 4:18 PM

## 2021-02-21 ENCOUNTER — Encounter (HOSPITAL_COMMUNITY): Payer: No Typology Code available for payment source

## 2021-02-22 ENCOUNTER — Telehealth: Payer: Self-pay | Admitting: Pharmacist

## 2021-02-22 NOTE — Telephone Encounter (Signed)
Called pt to follow up with [facility], he was able to pick this up from the [place]. Took last dose of losartan yesterday and will start Entresto this evening. F/u scheduled in 2 weeks for BP check, BMET, and continued CHF med titration.

## 2021-02-23 ENCOUNTER — Encounter (HOSPITAL_COMMUNITY): Payer: No Typology Code available for payment source

## 2021-02-26 ENCOUNTER — Encounter (HOSPITAL_COMMUNITY): Payer: No Typology Code available for payment source

## 2021-02-28 ENCOUNTER — Encounter (HOSPITAL_COMMUNITY): Payer: No Typology Code available for payment source

## 2021-02-28 DIAGNOSIS — Z951 Presence of aortocoronary bypass graft: Secondary | ICD-10-CM

## 2021-02-28 DIAGNOSIS — I5043 Acute on chronic combined systolic (congestive) and diastolic (congestive) heart failure: Secondary | ICD-10-CM

## 2021-02-28 DIAGNOSIS — Z794 Long term (current) use of insulin: Secondary | ICD-10-CM

## 2021-02-28 DIAGNOSIS — E1159 Type 2 diabetes mellitus with other circulatory complications: Secondary | ICD-10-CM

## 2021-02-28 DIAGNOSIS — R600 Localized edema: Secondary | ICD-10-CM

## 2021-02-28 DIAGNOSIS — I25810 Atherosclerosis of coronary artery bypass graft(s) without angina pectoris: Secondary | ICD-10-CM

## 2021-02-28 DIAGNOSIS — Z79899 Other long term (current) drug therapy: Secondary | ICD-10-CM

## 2021-02-28 DIAGNOSIS — I10 Essential (primary) hypertension: Secondary | ICD-10-CM

## 2021-03-02 ENCOUNTER — Encounter (HOSPITAL_COMMUNITY): Payer: No Typology Code available for payment source

## 2021-03-05 ENCOUNTER — Encounter (HOSPITAL_COMMUNITY): Payer: No Typology Code available for payment source

## 2021-03-07 ENCOUNTER — Encounter (HOSPITAL_COMMUNITY): Payer: No Typology Code available for payment source

## 2021-03-08 ENCOUNTER — Ambulatory Visit (INDEPENDENT_AMBULATORY_CARE_PROVIDER_SITE_OTHER): Payer: Medicare Other | Admitting: Pharmacist

## 2021-03-08 ENCOUNTER — Other Ambulatory Visit: Payer: Self-pay

## 2021-03-08 VITALS — BP 130/58 | HR 70 | Wt 237.0 lb

## 2021-03-08 DIAGNOSIS — I5043 Acute on chronic combined systolic (congestive) and diastolic (congestive) heart failure: Secondary | ICD-10-CM | POA: Diagnosis not present

## 2021-03-08 DIAGNOSIS — I5032 Chronic diastolic (congestive) heart failure: Secondary | ICD-10-CM | POA: Diagnosis not present

## 2021-03-08 DIAGNOSIS — I209 Angina pectoris, unspecified: Secondary | ICD-10-CM

## 2021-03-08 DIAGNOSIS — I10 Essential (primary) hypertension: Secondary | ICD-10-CM

## 2021-03-08 LAB — BASIC METABOLIC PANEL
BUN/Creatinine Ratio: 20 (ref 10–24)
BUN: 17 mg/dL (ref 8–27)
CO2: 26 mmol/L (ref 20–29)
Calcium: 9.6 mg/dL (ref 8.6–10.2)
Chloride: 101 mmol/L (ref 96–106)
Creatinine, Ser: 0.86 mg/dL (ref 0.76–1.27)
Glucose: 231 mg/dL — ABNORMAL HIGH (ref 65–99)
Potassium: 4.1 mmol/L (ref 3.5–5.2)
Sodium: 141 mmol/L (ref 134–144)
eGFR: 90 mL/min/{1.73_m2} (ref >59)

## 2021-03-08 MED ORDER — METOPROLOL SUCCINATE ER 100 MG PO TB24: 100.0000 mg | ORAL_TABLET | Freq: Every day | ORAL | 11 refills | Status: AC

## 2021-03-08 NOTE — Patient Instructions (Addendum)
It was nice to see you today  We will check labs today: BMET  Increase your torsemide back to 40mg  (2 tablets) daily  Stop carvedilol and replace it with metoprolol 1 tablet once daily  Continue your other medications and monitor your blood pressure and weight at home and let know if you have any concerns

## 2021-03-08 NOTE — Progress Notes (Signed)
Patient ID: Mason Patton                 DOB: 15-May-1946                      MRN: [id number]     HPI: Mason Patton is a 75 y.o. male referred by Dr. Johney Patton to pharmacy clinic for HF medication management. 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 Most recent LVEF 60-65% on 11/16/20, improved from 45-50% on 09/07/20 cath.   At last visit with Dr Mason Patton, patient overall felt better after prior episodes of worsening DOE and LR edema due to dietary non-compliance. He was changed from Lasix to torsemide 40 mg qAM, 58m qPM x 3 days then 40 mg daily thereafter, f/u BMET stable. Diet and importance of low salt was emphasized. At last visit with me on 8/15, BP was 140/60 and I changed losartan 528mto Entresto 49-5157mID for his CHF which [facility] agreed to [facility]. 1 week later, pt called with reports of dizzy spells and a low BP reading of 98/54. His torsemide was decreased to 64m17mily.  Pt presents today in good spirits accompanied by his wife. When he decreased his torsemide to 64mg45mly, his weight increased 3 lbs in a day. Keeps a detailed log of his diet with accompanying sodium intake (<1,500mg 66my) and BP/weights. Home weight stable 230-232 but increased to 234 lbs on the lower dose of torsemide. BP readings at  home: 126/62, 121/58, 117/61, 135/66, 119/58, 122/53. Low reading of 103/51 on 8/22 and felt dizzy. Did have a brief dizzy spell last night at church when he was sitting. It lasted for 1-2 seconds and then passed. Has had 3-4 episodes of dizziness since starting Entresto. Also occasionally gets hypoglycemic with glucose in the 50s - drinks orange juice and has a sandwich when this happens.  [facility] [place]  [place] still smoking 1/2 PPD. He has tried to quit numerous times in the past (nicotine patches, Chantix, nicotine inhalers), but has been unsuccessful. He was counseled on the cardiac benefits of quitting, but is not ready to quit at this time.   Current CHF meds:  Coreg 12.5 mg BID Entresto 49-51mg B74mardiance 10mg da28mTorsemide 20 mg daily  BP goal: < 130/80 mmHg  Family History:  Maternal aunt: cancer Maternal grandmother: DM Father: HLD Mother: thyroid disease Child: T1DM, asthma  Social History:  smoking 0.5 ppd x 50 years Numerous unsuccessful quit attempts   Diet:  Eats a lot of takeout Goal < 1500 mg sodium/day Wife cooks for him a lot Has been much better about adherence to sodium restriction   Exercise:  CV rehab completed Looking into gym memberships now  Likes to walk in the woods, but feels unstable and afraid he will fall  Labs: 02/05/2021: Scr 0.93, K 3.8 Wt Readings from Last 3 Encounters:  02/19/21 234 lb (106.1 kg)  02/16/21 237 lb 3.4 oz (107.6 kg)  01/29/21 245 lb (111.1 kg)   BP Readings from Last 3 Encounters:  02/19/21 140/60  02/16/21 100/60  01/29/21 132/70   Pulse Readings from Last 3 Encounters:  02/19/21 65  02/16/21 74  01/29/21 74    Renal function: CrCl cannot be calculated (Patient's most recent lab result is older than the maximum 21 days allowed.).  Past Medical History:  Diagnosis Date   Aortic stenosis    09/30/19 echo ([facility], Cardiologist Dr. 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/radiation" (05/19/2013)   Meningoencephalitis    Obesity    OSA on CPAP    last sleep study >8 yrs   Pneumonia    "twice" (05/19/2013)   Shortness of breath    Stroke [facility])    "they said I had a minor one when I had menigitis" (05/19/2013)   Thyroid disease    Tobacco abuse    quit 11/08/2012   Type II diabetes mellitus ([place])    Umbilical hernia     Current Outpatient Medications on File Prior to Visit  Medication Sig Dispense Refill   acetaminophen (TYLENOL) 325 MG tablet Take 2 tablets (650 mg total) by mouth every 4 (four) hours as needed for headache or mild pain.     amoxicillin (AMOXIL) 500 MG tablet Take 4 tablets by mouth 30-60 minutes prior to your dental cleaning 4 tablet 2   apixaban (ELIQUIS) 5 MG TABS tablet Take 5 mg by mouth 2 (two) times daily.     aspirin EC 81 MG EC tablet Take 1 tablet (81 mg total) by mouth daily. Swallow whole. 30 tablet 11   atorvastatin (LIPITOR) 40 MG tablet Take 40 mg by mouth at bedtime. Reported on 01/11/2016     Blood Glucose Monitoring Suppl (ONETOUCH VERIO FLEX SYSTEM) W/DEVICE KIT 1 each by Does not apply route daily. Dx: E11.9 1 kit 0   carvedilol (COREG) 12.5 MG tablet Take 1 tablet (12.5 mg total) by mouth 2 (two) times daily with a meal. 180 tablet 3   cholecalciferol (VITAMIN D3) 25 MCG (1000 UNIT) tablet Take 1,000 Units by mouth daily.     Cyanocobalamin (VITAMIN B12) 500 MCG TABS Take 500 mcg by mouth 2 (two) times daily.     empagliflozin (JARDIANCE) 10 MG TABS tablet Take 1 tablet (10 mg total) by mouth daily before breakfast. 90 tablet 3   ferrous sulfate 325 (65 FE) MG tablet Take 325 mg by mouth 3 (three) times a week.     glipiZIDE (GLUCOTROL) 10 MG tablet Take 10 mg by mouth 2 (two) times daily before a meal.     glucose blood (ONETOUCH VERIO) test strip Use to test blood sugar 2-3 times daily as instructed.  Dx: E11.9 200 each 3   HYDROcodone-acetaminophen (NORCO/VICODIN) 5-325 MG tablet Take 1 tablet by mouth every 6 (six) hours as needed (for pain).     levothyroxine (SYNTHROID) 200 MCG tablet Take 1 tablet (200 mcg total) by mouth daily. 30 tablet 0   metFORMIN (GLUCOPHAGE) 500 MG tablet Take 1,000 mg by mouth 2 (two) times daily with a meal.  Multiple Vitamins-Minerals (CENTRUM SILVER 50+MEN) TABS Take 1 tablet by mouth daily with breakfast.     Multiple Vitamins-Minerals (PRESERVISION AREDS PO) Take 1 capsule by mouth 2 (two) times daily.     nitroGLYCERIN (NITROSTAT) 0.4 MG SL tablet Place 0.4 mg under the tongue every 5 (five) minutes as needed.     omeprazole (PRILOSEC) 20 MG capsule Take 20 mg by mouth daily before breakfast.     ONETOUCH DELICA LANCETS FINE MISC Use to test blood sugar 2-3 times daily as instructed. Dx: E11.9 200 each 3   Pseudoephedrine-guaiFENesin (MUCINEX D PO) Take 1 tablet by mouth every 12 (twelve) hours.     sacubitril-valsartan (ENTRESTO) 49-51 MG Take 1 tablet by mouth 2 (two) times daily. 60 tablet 5   torsemide (DEMADEX) 20 MG tablet Take 1 tablet (20 mg total) by mouth daily. 72 tablet 0   VENTOLIN HFA 108 (90 Base) MCG/ACT inhaler Inhale 1-2 puffs into the lungs every 4 (four) hours as needed for wheezing or shortness of breath.      No current facility-administered medications on file prior to visit.    Allergies  Allergen Reactions   Codeine Nausea And Vomiting   Demerol Nausea And Vomiting     Assessment/Plan:  1. Diastolic CHF -  BP at goal < 130/15mHg after starting Entresto, although pt has noted a few dizzy spells in the past few weeks. Prefer to continue Entresto for CHF benefit. Will stop carvedilol 12.533mBID and replace with equivalent dose of Toprol 10014maily for less effects on BP to see if this improves his occasional dizzy spells. Will increase torsemide back to 64m73mily as the lower dose caused 3 lb weight gain in a day. Will  continue Entresto 49-51mg95m and Jardiance 10mg 49my. Checking BMET today. Pt will continue to monitor BP at home and call clinic with any concerns.  Noelani Harbach E. Yunior Jain, PharmD, BCACP, CPP [id number] [id number]: [phone] (336) [phone] 7:51 AM

## 2021-03-09 ENCOUNTER — Encounter (HOSPITAL_COMMUNITY): Payer: No Typology Code available for payment source

## 2021-03-14 ENCOUNTER — Encounter (HOSPITAL_COMMUNITY): Payer: No Typology Code available for payment source

## 2021-03-16 ENCOUNTER — Encounter (HOSPITAL_COMMUNITY): Payer: No Typology Code available for payment source

## 2021-05-18 ENCOUNTER — Emergency Department (HOSPITAL_COMMUNITY): Payer: No Typology Code available for payment source

## 2021-05-18 ENCOUNTER — Other Ambulatory Visit: Payer: Self-pay

## 2021-05-18 ENCOUNTER — Inpatient Hospital Stay (HOSPITAL_COMMUNITY)
Admission: EM | Admit: 2021-05-18 | Discharge: 2021-05-19 | DRG: 872 | Disposition: A | Discharge status: Home / Self Care | Payer: No Typology Code available for payment source | Attending: Family Medicine | Admitting: Family Medicine

## 2021-05-18 ENCOUNTER — Encounter (HOSPITAL_COMMUNITY): Payer: Self-pay | Admitting: Internal Medicine

## 2021-05-18 DIAGNOSIS — J439 Emphysema, unspecified: Secondary | ICD-10-CM | POA: Diagnosis present

## 2021-05-18 DIAGNOSIS — I2510 Atherosclerotic heart disease of native coronary artery without angina pectoris: Secondary | ICD-10-CM | POA: Diagnosis present

## 2021-05-18 DIAGNOSIS — Z833 Family history of diabetes mellitus: Secondary | ICD-10-CM

## 2021-05-18 DIAGNOSIS — Z8349 Family history of other endocrine, nutritional and metabolic diseases: Secondary | ICD-10-CM | POA: Diagnosis not present

## 2021-05-18 DIAGNOSIS — Z7982 Long term (current) use of aspirin: Secondary | ICD-10-CM

## 2021-05-18 DIAGNOSIS — R531 Weakness: Secondary | ICD-10-CM | POA: Diagnosis present

## 2021-05-18 DIAGNOSIS — A4189 Other specified sepsis: Secondary | ICD-10-CM | POA: Diagnosis present

## 2021-05-18 DIAGNOSIS — Z83438 Family history of other disorder of lipoprotein metabolism and other lipidemia: Secondary | ICD-10-CM

## 2021-05-18 DIAGNOSIS — M1990 Unspecified osteoarthritis, unspecified site: Secondary | ICD-10-CM | POA: Diagnosis present

## 2021-05-18 DIAGNOSIS — R6510 Systemic inflammatory response syndrome (SIRS) of non-infectious origin without acute organ dysfunction: Secondary | ICD-10-CM

## 2021-05-18 DIAGNOSIS — A419 Sepsis, unspecified organism: Secondary | ICD-10-CM | POA: Diagnosis not present

## 2021-05-18 DIAGNOSIS — G4733 Obstructive sleep apnea (adult) (pediatric): Secondary | ICD-10-CM | POA: Diagnosis present

## 2021-05-18 DIAGNOSIS — Z800 Family history of malignant neoplasm of digestive organs: Secondary | ICD-10-CM | POA: Diagnosis not present

## 2021-05-18 DIAGNOSIS — Z8661 Personal history of infections of the central nervous system: Secondary | ICD-10-CM

## 2021-05-18 DIAGNOSIS — Z825 Family history of asthma and other chronic lower respiratory diseases: Secondary | ICD-10-CM

## 2021-05-18 DIAGNOSIS — I5042 Chronic combined systolic (congestive) and diastolic (congestive) heart failure: Secondary | ICD-10-CM | POA: Diagnosis present

## 2021-05-18 DIAGNOSIS — F17210 Nicotine dependence, cigarettes, uncomplicated: Secondary | ICD-10-CM | POA: Diagnosis present

## 2021-05-18 DIAGNOSIS — I110 Hypertensive heart disease with heart failure: Secondary | ICD-10-CM | POA: Diagnosis present

## 2021-05-18 DIAGNOSIS — Z79899 Other long term (current) drug therapy: Secondary | ICD-10-CM

## 2021-05-18 DIAGNOSIS — D509 Iron deficiency anemia, unspecified: Secondary | ICD-10-CM | POA: Diagnosis present

## 2021-05-18 DIAGNOSIS — Z885 Allergy status to narcotic agent status: Secondary | ICD-10-CM

## 2021-05-18 DIAGNOSIS — Z96653 Presence of artificial knee joint, bilateral: Secondary | ICD-10-CM | POA: Diagnosis present

## 2021-05-18 DIAGNOSIS — J101 Influenza due to other identified influenza virus with other respiratory manifestations: Secondary | ICD-10-CM | POA: Diagnosis present

## 2021-05-18 DIAGNOSIS — Z20822 Contact with and (suspected) exposure to covid-19: Secondary | ICD-10-CM | POA: Diagnosis present

## 2021-05-18 DIAGNOSIS — I4891 Unspecified atrial fibrillation: Secondary | ICD-10-CM | POA: Diagnosis present

## 2021-05-18 DIAGNOSIS — E039 Hypothyroidism, unspecified: Secondary | ICD-10-CM | POA: Diagnosis present

## 2021-05-18 DIAGNOSIS — I350 Nonrheumatic aortic (valve) stenosis: Secondary | ICD-10-CM | POA: Diagnosis present

## 2021-05-18 DIAGNOSIS — Z9989 Dependence on other enabling machines and devices: Secondary | ICD-10-CM

## 2021-05-18 DIAGNOSIS — Z8673 Personal history of transient ischemic attack (TIA), and cerebral infarction without residual deficits: Secondary | ICD-10-CM

## 2021-05-18 DIAGNOSIS — K219 Gastro-esophageal reflux disease without esophagitis: Secondary | ICD-10-CM | POA: Diagnosis present

## 2021-05-18 DIAGNOSIS — Z7984 Long term (current) use of oral hypoglycemic drugs: Secondary | ICD-10-CM

## 2021-05-18 DIAGNOSIS — E785 Hyperlipidemia, unspecified: Secondary | ICD-10-CM | POA: Diagnosis present

## 2021-05-18 DIAGNOSIS — J111 Influenza due to unidentified influenza virus with other respiratory manifestations: Secondary | ICD-10-CM | POA: Diagnosis present

## 2021-05-18 DIAGNOSIS — Z801 Family history of malignant neoplasm of trachea, bronchus and lung: Secondary | ICD-10-CM | POA: Diagnosis not present

## 2021-05-18 DIAGNOSIS — Z7901 Long term (current) use of anticoagulants: Secondary | ICD-10-CM

## 2021-05-18 DIAGNOSIS — R32 Unspecified urinary incontinence: Secondary | ICD-10-CM | POA: Diagnosis present

## 2021-05-18 DIAGNOSIS — E11649 Type 2 diabetes mellitus with hypoglycemia without coma: Secondary | ICD-10-CM | POA: Diagnosis present

## 2021-05-18 DIAGNOSIS — Z952 Presence of prosthetic heart valve: Secondary | ICD-10-CM | POA: Diagnosis not present

## 2021-05-18 DIAGNOSIS — I10 Essential (primary) hypertension: Secondary | ICD-10-CM | POA: Diagnosis present

## 2021-05-18 DIAGNOSIS — Z951 Presence of aortocoronary bypass graft: Secondary | ICD-10-CM

## 2021-05-18 DIAGNOSIS — Z79890 Hormone replacement therapy (postmenopausal): Secondary | ICD-10-CM

## 2021-05-18 LAB — URINALYSIS, ROUTINE W REFLEX MICROSCOPIC
Bilirubin Urine: NEGATIVE
Glucose, UA: 50 mg/dL — AB
Hgb urine dipstick: NEGATIVE
Ketones, ur: NEGATIVE mg/dL
Leukocytes,Ua: NEGATIVE
Nitrite: NEGATIVE
Protein, ur: NEGATIVE mg/dL
Specific Gravity, Urine: 1.013 (ref 1.005–1.030)
pH: 5 (ref 5.0–8.0)

## 2021-05-18 LAB — CBC WITH DIFFERENTIAL/PLATELET
Abs Immature Granulocytes: 0.01 10*3/uL (ref 0.00–0.07)
Basophils Absolute: 0 10*3/uL (ref 0.0–0.1)
Basophils Relative: 0 %
Eosinophils Absolute: 0.1 10*3/uL (ref 0.0–0.5)
Eosinophils Relative: 1 %
HCT: 27.6 % — ABNORMAL LOW (ref 39.0–52.0)
Hemoglobin: 8.1 g/dL — ABNORMAL LOW (ref 13.0–17.0)
Immature Granulocytes: 0 %
Lymphocytes Relative: 18 %
Lymphs Abs: 1.2 10*3/uL (ref 0.7–4.0)
MCH: 20.4 pg — ABNORMAL LOW (ref 26.0–34.0)
MCHC: 29.3 g/dL — ABNORMAL LOW (ref 30.0–36.0)
MCV: 69.3 fL — ABNORMAL LOW (ref 80.0–100.0)
Monocytes Absolute: 0.4 10*3/uL (ref 0.1–1.0)
Monocytes Relative: 6 %
Neutro Abs: 4.8 10*3/uL (ref 1.7–7.7)
Neutrophils Relative %: 75 %
Platelets: 199 10*3/uL (ref 150–400)
RBC: 3.98 MIL/uL — ABNORMAL LOW (ref 4.22–5.81)
RDW: 19 % — ABNORMAL HIGH (ref 11.5–15.5)
WBC: 6.5 10*3/uL (ref 4.0–10.5)
nRBC: 0 % (ref 0.0–0.2)

## 2021-05-18 LAB — COMPREHENSIVE METABOLIC PANEL
ALT: 11 U/L (ref 0–44)
AST: 18 U/L (ref 15–41)
Albumin: 3.6 g/dL (ref 3.5–5.0)
Alkaline Phosphatase: 68 U/L (ref 38–126)
Anion gap: 8 (ref 5–15)
BUN: 12 mg/dL (ref 8–23)
CO2: 28 mmol/L (ref 22–32)
Calcium: 8.9 mg/dL (ref 8.9–10.3)
Chloride: 99 mmol/L (ref 98–111)
Creatinine, Ser: 1 mg/dL (ref 0.61–1.24)
GFR, Estimated: >60 mL/min (ref >60)
Glucose, Bld: 67 mg/dL — ABNORMAL LOW (ref 70–99)
Potassium: 3.4 mmol/L — ABNORMAL LOW (ref 3.5–5.1)
Sodium: 135 mmol/L (ref 135–145)
Total Bilirubin: 0.8 mg/dL (ref 0.3–1.2)
Total Protein: 6.6 g/dL (ref 6.5–8.1)

## 2021-05-18 LAB — LACTIC ACID, PLASMA
Lactic Acid, Venous: 2 mmol/L (ref 0.5–1.9)
Lactic Acid, Venous: 2.1 mmol/L (ref 0.5–1.9)
Lactic Acid, Venous: 3.1 mmol/L (ref 0.5–1.9)

## 2021-05-18 LAB — APTT: aPTT: 35 seconds (ref 24–36)

## 2021-05-18 LAB — PROTIME-INR
INR: 1.4 — ABNORMAL HIGH (ref 0.8–1.2)
Prothrombin Time: 17.5 seconds — ABNORMAL HIGH (ref 11.4–15.2)

## 2021-05-18 LAB — BRAIN NATRIURETIC PEPTIDE: B Natriuretic Peptide: 761 pg/mL — ABNORMAL HIGH (ref 0.0–100.0)

## 2021-05-18 LAB — RESP PANEL BY RT-PCR (FLU A&B, COVID) ARPGX2
Influenza A by PCR: POSITIVE — AB
Influenza B by PCR: NEGATIVE
SARS Coronavirus 2 by RT PCR: NEGATIVE

## 2021-05-18 LAB — IRON AND TIBC
Iron: 17 ug/dL — ABNORMAL LOW (ref 45–182)
Saturation Ratios: 4 % — ABNORMAL LOW (ref 17.9–39.5)
TIBC: 429 ug/dL (ref 250–450)
UIBC: 412 ug/dL

## 2021-05-18 LAB — PROCALCITONIN: Procalcitonin: <0.1 ng/mL

## 2021-05-18 MED ORDER — DOCUSATE SODIUM 100 MG PO CAPS
100.0000 mg | ORAL_CAPSULE | Freq: Two times a day (BID) | ORAL | Status: DC
  Administered 2021-05-18 – 2021-05-19 (×2): 100 mg via ORAL
  Filled 2021-05-18 (×2): qty 1

## 2021-05-18 MED ORDER — LEVOTHYROXINE SODIUM 100 MCG PO TABS
200.0000 ug | ORAL_TABLET | Freq: Every day | ORAL | Status: DC
  Administered 2021-05-19: 200 ug via ORAL
  Filled 2021-05-18: qty 2

## 2021-05-18 MED ORDER — LACTATED RINGERS IV BOLUS (SEPSIS)
1000.0000 mL | Freq: Once | INTRAVENOUS | Status: AC
  Administered 2021-05-18: 1000 mL via INTRAVENOUS

## 2021-05-18 MED ORDER — METOPROLOL SUCCINATE ER 100 MG PO TB24
100.0000 mg | ORAL_TABLET | Freq: Every day | ORAL | Status: DC
  Administered 2021-05-19: 100 mg via ORAL
  Filled 2021-05-18: qty 1

## 2021-05-18 MED ORDER — GUAIFENESIN ER 600 MG PO TB12
1200.0000 mg | ORAL_TABLET | Freq: Two times a day (BID) | ORAL | Status: DC
  Administered 2021-05-18 – 2021-05-19 (×2): 1200 mg via ORAL
  Filled 2021-05-18 (×2): qty 2

## 2021-05-18 MED ORDER — ACETAMINOPHEN 325 MG PO TABS
650.0000 mg | ORAL_TABLET | Freq: Four times a day (QID) | ORAL | Status: DC | PRN
  Administered 2021-05-18: 650 mg via ORAL
  Filled 2021-05-18: qty 2

## 2021-05-18 MED ORDER — OSELTAMIVIR PHOSPHATE 75 MG PO CAPS
75.0000 mg | ORAL_CAPSULE | Freq: Two times a day (BID) | ORAL | Status: DC
  Administered 2021-05-18 – 2021-05-19 (×2): 75 mg via ORAL
  Filled 2021-05-18 (×3): qty 1

## 2021-05-18 MED ORDER — SODIUM CHLORIDE 0.9 % IV SOLN
500.0000 mg | Freq: Once | INTRAVENOUS | Status: AC
  Administered 2021-05-19: 500 mg via INTRAVENOUS
  Filled 2021-05-18: qty 500

## 2021-05-18 MED ORDER — SODIUM CHLORIDE 0.9 % IV SOLN: Freq: Once | INTRAVENOUS | Status: AC

## 2021-05-18 MED ORDER — PANTOPRAZOLE SODIUM 40 MG PO TBEC
40.0000 mg | DELAYED_RELEASE_TABLET | Freq: Every day | ORAL | Status: DC
  Administered 2021-05-19: 40 mg via ORAL
  Filled 2021-05-18: qty 1

## 2021-05-18 MED ORDER — ACETAMINOPHEN 650 MG RE SUPP: 650.0000 mg | Freq: Four times a day (QID) | RECTAL | Status: DC | PRN

## 2021-05-18 MED ORDER — INSULIN ASPART 100 UNIT/ML IJ SOLN
0.0000 [IU] | Freq: Three times a day (TID) | INTRAMUSCULAR | Status: DC
Start: 2021-05-19 — End: 2021-05-19
  Administered 2021-05-19: 2 [IU] via SUBCUTANEOUS

## 2021-05-18 MED ORDER — ALBUTEROL SULFATE (2.5 MG/3ML) 0.083% IN NEBU: 2.5000 mg | INHALATION_SOLUTION | Freq: Four times a day (QID) | RESPIRATORY_TRACT | Status: DC | PRN

## 2021-05-18 MED ORDER — ATORVASTATIN CALCIUM 40 MG PO TABS
40.0000 mg | ORAL_TABLET | Freq: Every day | ORAL | Status: DC
  Administered 2021-05-18: 40 mg via ORAL
  Filled 2021-05-18: qty 1

## 2021-05-18 MED ORDER — GUAIFENESIN-DM 100-10 MG/5ML PO SYRP
5.0000 mL | ORAL_SOLUTION | ORAL | Status: DC | PRN
  Administered 2021-05-18: 5 mL via ORAL
  Filled 2021-05-18: qty 10

## 2021-05-18 MED ORDER — APIXABAN 5 MG PO TABS
5.0000 mg | ORAL_TABLET | Freq: Two times a day (BID) | ORAL | Status: DC
  Administered 2021-05-18 – 2021-05-19 (×2): 5 mg via ORAL
  Filled 2021-05-18 (×2): qty 1

## 2021-05-18 MED ORDER — SODIUM CHLORIDE 0.9 % IV SOLN
1.0000 g | Freq: Once | INTRAVENOUS | Status: AC
  Administered 2021-05-18: 1 g via INTRAVENOUS
  Filled 2021-05-18: qty 10

## 2021-05-18 MED ORDER — TORSEMIDE 20 MG PO TABS
20.0000 mg | ORAL_TABLET | Freq: Every day | ORAL | Status: DC
  Administered 2021-05-19: 20 mg via ORAL
  Filled 2021-05-18: qty 1

## 2021-05-18 MED ORDER — SACUBITRIL-VALSARTAN 49-51 MG PO TABS
1.0000 | ORAL_TABLET | Freq: Two times a day (BID) | ORAL | Status: DC
  Administered 2021-05-18 – 2021-05-19 (×2): 1 via ORAL
  Filled 2021-05-18 (×3): qty 1

## 2021-05-18 MED ORDER — ACETAMINOPHEN 325 MG PO TABS
650.0000 mg | ORAL_TABLET | Freq: Once | ORAL | Status: AC
  Administered 2021-05-18: 650 mg via ORAL
  Filled 2021-05-18: qty 2

## 2021-05-18 NOTE — ED Notes (Signed)
Critical value: Lactic 2.0  Notified Butler MD at this time.

## 2021-05-18 NOTE — H&P (Signed)
History and Physical    ABSHIR PAOLINI RNH:[id number] DOB: 13-Jul-1945 DOA: 05/18/2021  PCP: Heywood Bene, PA-C  Patient coming from: Home  Chief Complaint: cough, fever.  HPI: Mason Patton is a 75 y.o. male with medical history significant of chronic combined HF, a fib, CAD s/p CABG, HTN, HLD, DM2. Presenting with cough and fever. Symptoms started 5 days ago. He had increased cough w/ some difficulty breathing. [facility]. He was started on amoxicillin and tessalon pearls. Per wife, he was later notified that he was flu positive, but did not receive antiviral therapy. His cough worsened. He began to complain of achiness and decreased appetite. He was noted to have fever at home. His wife became concerned this morning as his symptoms did not improve. So they came to the ED for help. He denies any other aggravating or alleviating factors.   ED Course: He was flu positive. Had elevated lactic acid. He was given fluids, abx. TRH was called for admission.   Review of Systems:  Denies CP, abdominal pain, N/V/D, palpitations, sick contacts. Review of systems is otherwise negative for all not mentioned in HPI.   PMHx Past Medical History:  Diagnosis Date   Aortic stenosis    09/30/19 echo ([facility], Cardiologist Dr. Geraldo Pitter): Mild-moderate AS, MaxVel 308 cm/s, MeanPG 22 mmHg, AVA 1.6 cm2   Arthritis    Atrial fibrillation (HCC)    Carotid artery stenosis    11/03/19 [place] ([facility]): 50-69% RICA stenosis   Cholecystitis with cholelithiasis    Eczema    Emphysema of lung (HCC)    Gallstones and inflammation of gallbladder without obstruction    Heart murmur    History of nuclear stress test 02/23/2010   dipyridamole; normal pattern of perfusion in all regions; normal, low risk study    Hyperlipidemia    Hypertension    Hypothyroidism    "had thyroid killed w/radiation" (05/19/2013)   Meningoencephalitis    Obesity    OSA on CPAP    last  sleep study >8 yrs   Pneumonia    "twice" (05/19/2013)   Shortness of breath    Stroke [facility])    "they said I had a minor one when I had menigitis" (05/19/2013)   Thyroid disease    Tobacco abuse    quit 11/08/2012   Type II diabetes mellitus ([place])    Umbilical hernia     PSHx Past Surgical History:  Procedure Laterality Date   AORTIC VALVE REPLACEMENT N/A 09/11/2020   Procedure: AORTIC VALVE REPLACEMENT (AVR);  Surgeon: Gaye Pollack, MD;  Location: [place];  Service: Open Heart Surgery;  Laterality: N/A;   CHOLECYSTECTOMY  09/09/2011   Procedure: LAPAROSCOPIC CHOLECYSTECTOMY WITH INTRAOPERATIVE CHOLANGIOGRAM;  Surgeon: Belva Crome, MD;  Location: [place];  Service: General;  Laterality: N/A;   CLIPPING OF ATRIAL APPENDAGE N/A 09/11/2020   Procedure: CLIPPING OF ATRIAL APPENDAGE;  Surgeon: Gaye Pollack, MD;  Location: [place];  Service: Open Heart Surgery;  Laterality: N/A;   CORONARY ARTERY BYPASS GRAFT N/A 09/11/2020   Procedure: CORONARY ARTERY BYPASS GRAFTING (CABG), ON PUMP, TIMES 2 , USING LEFT INTERNAL MAMMARY ARTERY AND ENDOSCOPICALLY HARVESTED RIGHT GREATER SAPHENOUS VEIN;  Surgeon: Gaye Pollack, MD;  Location: [place];  Service: Open Heart Surgery;  Laterality: N/A;   HERNIA REPAIR  04/2006   UHR   IR THORACENTESIS ASP PLEURAL SPACE W/IMG GUIDE  10/25/2020   JOINT REPLACEMENT  KNEE ARTHROSCOPY Right    "w2 times" (05/19/2013)   RIGHT/LEFT HEART CATH AND CORONARY ANGIOGRAPHY N/A 09/07/2020   Procedure: RIGHT/LEFT HEART CATH AND CORONARY ANGIOGRAPHY;  Surgeon: Lorretta Harp, MD;  Location: [place] [facility];  Service: Cardiovascular;  Laterality: N/A;   TEE WITHOUT CARDIOVERSION N/A 09/11/2020   Procedure: TRANSESOPHAGEAL ECHOCARDIOGRAM (TEE);  Surgeon: Gaye Pollack, MD;  Location: [place];  Service: Open Heart Surgery;  Laterality: N/A;   TEE WITHOUT CARDIOVERSION N/A 11/16/2020   Procedure: TRANSESOPHAGEAL ECHOCARDIOGRAM (TEE);  Surgeon: Sueanne Margarita, MD;  Location: [facility]  ENDOSCOPY;  Service: Cardiovascular;  Laterality: N/A;   TONSILLECTOMY     TOTAL KNEE ARTHROPLASTY Left 05/19/2013   TOTAL KNEE ARTHROPLASTY Left 05/19/2013   Procedure: TOTAL KNEE ARTHROPLASTY;  Surgeon: Newt Minion, MD;  Location: [place];  Service: Orthopedics;  Laterality: Left;  Left Total Knee Arthroplasty   TOTAL KNEE ARTHROPLASTY Right 11/24/2019   TOTAL KNEE ARTHROPLASTY Right 11/24/2019   Procedure: RIGHT TOTAL KNEE ARTHROPLASTY;  Surgeon: Newt Minion, MD;  Location: [place];  Service: Orthopedics;  Laterality: Right;   TRANSTHORACIC ECHOCARDIOGRAM  03/28/2005   EF=>55%, mod conc LVH; LA mod dilated & borderline RA enlargement; mild TR; mild pulm valve regurg    SocHx  reports that he has been smoking cigarettes. He has a 25.00 pack-year smoking history. He has never used smokeless tobacco. He reports that he does not drink alcohol and does not use drugs.  Allergies  Allergen Reactions   Codeine Nausea And Vomiting   Demerol Nausea And Vomiting    FamHx Family History  Problem Relation Age of Onset   Cancer Maternal Aunt        colon   Cancer Maternal Aunt        lung   Thyroid disease Mother    Hyperlipidemia Father    Diabetes Maternal Grandmother    Asthma Child    Diabetes type I Child     Prior to Admission medications   Medication Sig Start Date End Date Taking? Authorizing Provider  acetaminophen (TYLENOL) 325 MG tablet Take 2 tablets (650 mg total) by mouth every 4 (four) hours as needed for headache or mild pain. 09/15/20   Nani Skillern, PA-C  amoxicillin (AMOXIL) 500 MG tablet Take 4 tablets by mouth 30-60 minutes prior to your dental cleaning 11/28/20   Richardson Dopp T, PA-C  apixaban (ELIQUIS) 5 MG TABS tablet Take 5 mg by mouth 2 (two) times daily.    [provider]  aspirin EC 81 MG EC tablet Take 1 tablet (81 mg total) by mouth daily. Swallow whole. 09/18/20   Elgie Collard, PA-C  atorvastatin (LIPITOR) 40 MG tablet Take 40 mg by mouth  at bedtime. Reported on 01/11/2016 11/22/15   [provider]  Blood Glucose Monitoring Suppl (ONETOUCH VERIO FLEX SYSTEM) W/DEVICE KIT 1 each by Does not apply route daily. Dx: E11.9 03/30/15   Philemon Kingdom, MD  cholecalciferol (VITAMIN D3) 25 MCG (1000 UNIT) tablet Take 1,000 Units by mouth daily.    [provider]  Cyanocobalamin (VITAMIN B12) 500 MCG TABS Take 500 mcg by mouth 2 (two) times daily.    [provider]  empagliflozin (JARDIANCE) 10 MG TABS tablet Take 1 tablet (10 mg total) by mouth daily before breakfast. 02/12/21   Freada Bergeron, MD  ferrous sulfate 325 (65 FE) MG tablet Take 325 mg by mouth 3 (three) times a week. 03/21/20   [provider]  glipiZIDE (GLUCOTROL) 10 MG tablet Take 10 mg by mouth 2 (two) times daily before a meal.    [provider]  glucose blood (ONETOUCH VERIO) test strip Use to test blood sugar 2-3 times daily as instructed. Dx: E11.9 10/12/15   Philemon Kingdom, MD  HYDROcodone-acetaminophen (NORCO/VICODIN) 5-325 MG tablet Take 1 tablet by mouth every 6 (six) hours as needed (for pain).    [provider]  levothyroxine (SYNTHROID) 200 MCG tablet Take 1 tablet (200 mcg total) by mouth daily. 11/07/20 12/26/20  Sanjuan Dame, MD  metFORMIN (GLUCOPHAGE) 500 MG tablet Take 1,000 mg by mouth 2 (two) times daily with a meal.    [provider]  metoprolol succinate (TOPROL-XL) 100 MG 24 hr tablet Take 1 tablet (100 mg total) by mouth daily. Take with or immediately following a meal. 03/08/21   Freada Bergeron, MD  Multiple Vitamins-Minerals (CENTRUM SILVER 50+MEN) TABS Take 1 tablet by mouth daily with breakfast.    [provider]  Multiple Vitamins-Minerals (PRESERVISION AREDS PO) Take 1 capsule by mouth 2 (two) times daily.    [provider]  nitroGLYCERIN (NITROSTAT) 0.4 MG SL tablet Place 0.4 mg under the tongue every 5 (five) minutes as needed. 08/17/20   [provider]  omeprazole (PRILOSEC) 20 MG capsule Take 20 mg by mouth daily before breakfast. 01/18/20   [provider]  [place] Use to test blood sugar 2-3 times daily as instructed. Dx: E11.9 10/12/15   Philemon Kingdom, MD  Pseudoephedrine-guaiFENesin [facility] D PO) Take 1 tablet by mouth every 12 (twelve) hours.    [provider]  sacubitril-valsartan (ENTRESTO) 49-51 MG Take 1 tablet by mouth 2 (two) times daily. 02/19/21   Freada Bergeron, MD  torsemide (DEMADEX) 20 MG tablet Take 40 mg by mouth daily. 02/28/21   Supple, Megan E, RPH-CPP  VENTOLIN HFA 108 (90 Base) MCG/ACT inhaler Inhale 1-2 puffs into the lungs every 4 (four) hours as needed for wheezing or shortness of breath.  07/12/15   [provider]    Physical Exam: Vitals:   05/18/21 1421 05/18/21 1427 05/18/21 1617  BP: (!) 169/77  (!) 141/66  Pulse: 82  84  Resp: (!) 24  19  Temp: (!) 102.3 F (39.1 C)    TempSrc: Oral    SpO2: 99%  94%  Weight: 104.3 kg 108 kg   Height: '5\' 10"'  (1.778 m) '5\' 9"'  (1.753 m)     General: 75 y.o. male resting in bed in NAD Eyes: PERRL, normal sclera ENMT: Nares patent w/o discharge, orophaynx clear, dentition normal, ears w/o discharge/lesions/ulcers Neck: Supple, trachea midline Cardiovascular: irregular, +S1, S2, no g/r, 4/6 SEM, equal pulses throughout Respiratory: decreased at bases, no r/r, scattered wheeze, slightly increased WOB GI: BS+, NDNT, no masses noted, no organomegaly noted MSK: No e/c/c Neuro: A&O x 3, no focal deficits Psyc: Appropriate interaction and affect, calm/cooperative  Labs on Admission: I have personally reviewed following labs and imaging studies  CBC: Recent Labs  Lab 05/18/21 1431  WBC 6.5  NEUTROABS 4.8  HGB 8.1*  HCT 27.6*  MCV 69.3*  PLT 619   Basic Metabolic Panel: Recent Labs  Lab 05/18/21 1431  NA 135  K 3.4*  CL 99  CO2 28  GLUCOSE 67*  BUN 12  CREATININE 1.00  CALCIUM 8.9    GFR: Estimated Creatinine Clearance: 77.3 mL/min (by C-G formula based on SCr of 1 mg/dL). Liver Function Tests:  Recent Labs  Lab 05/18/21 1431  AST 18  ALT 11  ALKPHOS 68  BILITOT 0.8  PROT 6.6  ALBUMIN 3.6   No results for input(s): LIPASE, AMYLASE in the last 168 hours. No results for input(s): AMMONIA in the last 168 hours. Coagulation Profile: Recent Labs  Lab 05/18/21 1431  INR 1.4*   Cardiac Enzymes: No results for input(s): CKTOTAL, CKMB, CKMBINDEX, TROPONINI in the last 168 hours. BNP (last 3 results) Recent Labs    10/02/20 1032 10/17/20 1404 01/18/21 1205  PROBNP 2,384* 1,481* 3,251*   HbA1C: No results for input(s): HGBA1C in the last 72 hours. CBG: No results for input(s): GLUCAP in the last 168 hours. Lipid Profile: No results for input(s): CHOL, HDL, LDLCALC, TRIG, CHOLHDL, LDLDIRECT in the last 72 hours. Thyroid Function Tests: No results for input(s): TSH, T4TOTAL, FREET4, T3FREE, THYROIDAB in the last 72 hours. Anemia Panel: No results for input(s): VITAMINB12, FOLATE, FERRITIN, TIBC, IRON, RETICCTPCT in the last 72 hours. Urine analysis:    Component Value Date/Time   COLORURINE YELLOW 11/10/2020 [place] 11/10/2020 1514   LABSPEC 1.010 11/10/2020 1514   PHURINE 6.0 11/10/2020 1514   GLUCOSEU NEGATIVE 11/10/2020 1514   HGBUR MODERATE (A) 11/10/2020 [place] 11/10/2020 1514   KETONESUR 5 (A) 11/10/2020 1514   PROTEINUR NEGATIVE 11/10/2020 1514   UROBILINOGEN 2.0 (H) 03/22/2013 2304   NITRITE NEGATIVE 11/10/2020 1514   LEUKOCYTESUR NEGATIVE 11/10/2020 1514    Radiological Exams on Admission: DG Chest Port 1 View  Result Date: 05/18/2021 CLINICAL DATA:  Questionable sepsis - evaluate for abnormality EXAM: PORTABLE CHEST 1 VIEW COMPARISON:  Radiograph and CT 11/08/2020 FINDINGS: Patient is post median sternotomy. Prosthetic aortic valve. Left atrial clipping. Cardiomegaly is similar. Improved left  basilar aeration with decreased pleural effusion. No acute airspace disease. No pulmonary edema. No pneumothorax. Stable osseous structures. IMPRESSION: 1. Improved left basilar aeration with decreased pleural effusion. 2. Stable cardiomegaly. Electronically Signed   By: Keith Rake M.D.   On: 05/18/2021 15:16    EKG: Independently reviewed. Afib, no st elevations  Assessment/Plan Influenza A Dyspnea Sepsis secondary to above     - admit to inpt, progressive     - tamiflu, guaifenesin, PRN albuterol, O2 as needed     - fluids     - started on rocephin/azithro in ED, check procal, can d/c if neg     - IS, flutter  Chronic combined HF CAD s/p CABG A fib Hx of CVA     - continue home regimen when confirmed  HTN     - continue home regimen when confirmed  OSA     - CPAP qHS  DM2 Hypoglycemia     - DM diet, A1c, SSI, glucose checks  Hypothyroidism     - resume home synthroid when confirmed  GERD     - PPI  HLD     - resume home statin when confirmed  Microcytic anemia     - check iron studies     - no evidence of bleed  DVT prophylaxis: eliquis  Code Status: FULL  Family Communication: w/ wife at bedside  Consults called: None   Status is: Inpatient  Remains inpatient appropriate because: severity of illness  Daryl Quiros A Marylyn Ishihara DO [facility]  If 7PM-7AM, please contact night-coverage [email or web address]  05/18/2021, 4:35 PM

## 2021-05-18 NOTE — ED Triage Notes (Signed)
Pt BIB EMS. Pt coming from home, per wife pt has had AMS since 8am. Pt usually ambulates with no assistance put was unable to do that today and had a episode of incontinence. Pt dx with the flu last Tuesday. Pt states sxs started getting worse on Thursday. Pt takes Tylenol for fever but has not had any today. Hx DM2  BP-172/88 (pt did take BP meds today) CBG-114 Temp-101.1 HR-76 O2-97% RA

## 2021-05-18 NOTE — ED Provider Notes (Signed)
[place] [facility] DEPT Provider Note   CSN: [id number] Arrival date & time: 05/18/21  1414     History Chief Complaint  Patient presents with   Altered Mental Status   Influenza    Mason Patton is a 75 y.o. male.  He is brought in today from home for increased cough and shortness of breath.  It sounds like he was diagnosed with influenza at the [place] earlier this week.  He said he is on medication for it.  He said he does not feel any better.  He has not had any Tylenol today.  He usually ambulates without assistance but was weak and could not do that today and had 1 episode of incontinence of urine.  Reportedly was altered since this morning although patient is not able to elaborate and family is not here yet.  The history is provided by the patient and the EMS personnel.  Influenza Presenting symptoms: cough, fatigue, fever and shortness of breath   Presenting symptoms: no diarrhea, no headaches, no nausea and no sore throat   Cough:    Cough characteristics:  Non-productive   Sputum characteristics:  Nondescript   Severity:  Moderate   Onset quality:  Gradual   Duration:  4 days   Timing:  Intermittent   Progression:  Unchanged   Chronicity:  New Severity:  Moderate Onset quality:  Gradual Duration:  4 days Progression:  Unchanged Chronicity:  New Relieved by:  Nothing Worsened by:  Nothing Ineffective treatments:  Rest and prescription medications Associated symptoms: decreased physical activity   Associated symptoms: no chills   Risk factors: being elderly       Past Medical History:  Diagnosis Date   Aortic stenosis    09/30/19 echo ([facility], Cardiologist Dr. Geraldo Pitter): Mild-moderate AS, MaxVel 308 cm/s, MeanPG 22 mmHg, AVA 1.6 cm2   Arthritis    Atrial fibrillation ([place])    Carotid artery stenosis    11/03/19 [place] ([facility]): 50-69% RICA stenosis   Cholecystitis with cholelithiasis    Eczema    Emphysema of lung  (HCC)    Gallstones and inflammation of gallbladder without obstruction    Heart murmur    History of nuclear stress test 02/23/2010   dipyridamole; normal pattern of perfusion in all regions; normal, low risk study    Hyperlipidemia    Hypertension    Hypothyroidism    "had thyroid killed w/radiation" (05/19/2013)   Meningoencephalitis    Obesity    OSA on CPAP    last sleep study >8 yrs   Pneumonia    "twice" (05/19/2013)   Shortness of breath    Stroke [facility])    "they said I had a minor one when I had menigitis" (05/19/2013)   Thyroid disease    Tobacco abuse    quit 11/08/2012   Type II diabetes mellitus ([place])    Umbilical hernia     Patient Active Problem List   Diagnosis Date Noted   Diastolic CHF ([place]) 84/16/6063   Staphylococcus epidermidis bacteremia    Sepsis ([place]) 11/11/2020   Pneumonia 11/08/2020   Acute respiratory failure with hypoxia (HCC)    Acute CHF ([place]) 11/06/2020   Dyspnea 11/05/2020   Acute exacerbation of CHF (congestive heart failure) ([place]) 11/05/2020   Coronary artery disease 09/11/2020   Aortic stenosis, severe 09/07/2020   Unstable angina ([place]) 09/06/2020   Arthritis of right knee 11/24/2019   Unilateral primary osteoarthritis, right knee    Uncontrolled type 2 diabetes  mellitus with peripheral neuropathy 03/27/2015   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   CVA (cerebral infarction) 11/16/2013   Dizziness 11/15/2013   Left sided lacunar infarction [facility]) 11/15/2013   Lacunar infarction ([place]) 06/01/2013   Acute, but ill-defined, cerebrovascular disease 06/01/2013   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   Acute CVA (cerebrovascular accident) ([place]) 03/29/2013   Numbness and tingling 03/27/2013   Hyperglycemia 03/26/2013   Meningitis 03/25/2013   Encephalopathy acute 03/23/2013   Fever 03/23/2013   OSA on CPAP 03/23/2013   Obesity 03/23/2013   Murmur 03/16/2013   Preoperative cardiovascular examination 03/16/2013   Dyslipidemia 03/16/2013    Hypertension    Cholecystitis with cholelithiasis    Apnea, sleep 01/23/2010    Past Surgical History:  Procedure Laterality Date   AORTIC VALVE REPLACEMENT N/A 09/11/2020   Procedure: AORTIC VALVE REPLACEMENT (AVR);  Surgeon: Gaye Pollack, MD;  Location: [place];  Service: Open Heart Surgery;  Laterality: N/A;   CHOLECYSTECTOMY  09/09/2011   Procedure: LAPAROSCOPIC CHOLECYSTECTOMY WITH INTRAOPERATIVE CHOLANGIOGRAM;  Surgeon: Belva Crome, MD;  Location: [place];  Service: General;  Laterality: N/A;   CLIPPING OF ATRIAL APPENDAGE N/A 09/11/2020   Procedure: CLIPPING OF ATRIAL APPENDAGE;  Surgeon: Gaye Pollack, MD;  Location: [place];  Service: Open Heart Surgery;  Laterality: N/A;   CORONARY ARTERY BYPASS GRAFT N/A 09/11/2020   Procedure: CORONARY ARTERY BYPASS GRAFTING (CABG), ON PUMP, TIMES 2 , USING LEFT INTERNAL MAMMARY ARTERY AND ENDOSCOPICALLY HARVESTED RIGHT GREATER SAPHENOUS VEIN;  Surgeon: Gaye Pollack, MD;  Location: [place];  Service: Open Heart Surgery;  Laterality: N/A;   HERNIA REPAIR  04/2006   UHR   IR THORACENTESIS ASP PLEURAL SPACE W/IMG GUIDE  10/25/2020   JOINT REPLACEMENT     KNEE ARTHROSCOPY Right    "w2 times" (05/19/2013)   RIGHT/LEFT HEART CATH AND CORONARY ANGIOGRAPHY N/A 09/07/2020   Procedure: RIGHT/LEFT HEART CATH AND CORONARY ANGIOGRAPHY;  Surgeon: Lorretta Harp, MD;  Location: [place] [facility];  Service: Cardiovascular;  Laterality: N/A;   TEE WITHOUT CARDIOVERSION N/A 09/11/2020   Procedure: TRANSESOPHAGEAL ECHOCARDIOGRAM (TEE);  Surgeon: Gaye Pollack, MD;  Location: [place];  Service: Open Heart Surgery;  Laterality: N/A;   TEE WITHOUT CARDIOVERSION N/A 11/16/2020   Procedure: TRANSESOPHAGEAL ECHOCARDIOGRAM (TEE);  Surgeon: Sueanne Margarita, MD;  Location: [facility] ENDOSCOPY;  Service: Cardiovascular;  Laterality: N/A;   TONSILLECTOMY     TOTAL KNEE ARTHROPLASTY Left 05/19/2013   TOTAL KNEE ARTHROPLASTY Left 05/19/2013   Procedure: TOTAL KNEE ARTHROPLASTY;   Surgeon: Newt Minion, MD;  Location: [place];  Service: Orthopedics;  Laterality: Left;  Left Total Knee Arthroplasty   TOTAL KNEE ARTHROPLASTY Right 11/24/2019   TOTAL KNEE ARTHROPLASTY Right 11/24/2019   Procedure: RIGHT TOTAL KNEE ARTHROPLASTY;  Surgeon: Newt Minion, MD;  Location: [place];  Service: Orthopedics;  Laterality: Right;   TRANSTHORACIC ECHOCARDIOGRAM  03/28/2005   EF=>55%, mod conc LVH; LA mod dilated & borderline RA enlargement; mild TR; mild pulm valve regurg       Family History  Problem Relation Age of Onset   Cancer Maternal Aunt        colon   Cancer Maternal Aunt        lung   Thyroid disease Mother    Hyperlipidemia Father    Diabetes Maternal Grandmother    Asthma Child    Diabetes type I Child     Social History   Tobacco Use   Smoking status: Every Day  Packs/day: 0.50    Years: 50.00    Pack years: 25.00    Types: Cigarettes   Smokeless tobacco: Never  Vaping Use   Vaping Use: Never used  Substance Use Topics   Alcohol use: No   Drug use: No    Home Medications Prior to Admission medications   Medication Sig Start Date End Date Taking? Authorizing Provider  acetaminophen (TYLENOL) 325 MG tablet Take 2 tablets (650 mg total) by mouth every 4 (four) hours as needed for headache or mild pain. 09/15/20   Nani Skillern, PA-C  amoxicillin (AMOXIL) 500 MG tablet Take 4 tablets by mouth 30-60 minutes prior to your dental cleaning 11/28/20   Richardson Dopp T, PA-C  apixaban (ELIQUIS) 5 MG TABS tablet Take 5 mg by mouth 2 (two) times daily.    [provider]  aspirin EC 81 MG EC tablet Take 1 tablet (81 mg total) by mouth daily. Swallow whole. 09/18/20   Elgie Collard, PA-C  atorvastatin (LIPITOR) 40 MG tablet Take 40 mg by mouth at bedtime. Reported on 01/11/2016 11/22/15   [provider]  Blood Glucose Monitoring Suppl (ONETOUCH VERIO FLEX SYSTEM) W/DEVICE KIT 1 each by Does not apply route daily. Dx: E11.9 03/30/15   Philemon Kingdom, MD  cholecalciferol (VITAMIN D3) 25 MCG (1000 UNIT) tablet Take 1,000 Units by mouth daily.    [provider]  Cyanocobalamin (VITAMIN B12) 500 MCG TABS Take 500 mcg by mouth 2 (two) times daily.    [provider]  empagliflozin (JARDIANCE) 10 MG TABS tablet Take 1 tablet (10 mg total) by mouth daily before breakfast. 02/12/21   Freada Bergeron, MD  ferrous sulfate 325 (65 FE) MG tablet Take 325 mg by mouth 3 (three) times a week. 03/21/20   [provider]  glipiZIDE (GLUCOTROL) 10 MG tablet Take 10 mg by mouth 2 (two) times daily before a meal.    [provider]  glucose blood (ONETOUCH VERIO) test strip Use to test blood sugar 2-3 times daily as instructed. Dx: E11.9 10/12/15   Philemon Kingdom, MD  HYDROcodone-acetaminophen (NORCO/VICODIN) 5-325 MG tablet Take 1 tablet by mouth every 6 (six) hours as needed (for pain).    [provider]  levothyroxine (SYNTHROID) 200 MCG tablet Take 1 tablet (200 mcg total) by mouth daily. 11/07/20 12/26/20  Sanjuan Dame, MD  metFORMIN (GLUCOPHAGE) 500 MG tablet Take 1,000 mg by mouth 2 (two) times daily with a meal.    [provider]  metoprolol succinate (TOPROL-XL) 100 MG 24 hr tablet Take 1 tablet (100 mg total) by mouth daily. Take with or immediately following a meal. 03/08/21   Freada Bergeron, MD  Multiple Vitamins-Minerals (CENTRUM SILVER 50+MEN) TABS Take 1 tablet by mouth daily with breakfast.    [provider]  Multiple Vitamins-Minerals (PRESERVISION AREDS PO) Take 1 capsule by mouth 2 (two) times daily.    [provider]  nitroGLYCERIN (NITROSTAT) 0.4 MG SL tablet Place 0.4 mg under the tongue every 5 (five) minutes as needed. 08/17/20   [provider]  omeprazole (PRILOSEC) 20 MG capsule Take 20 mg by mouth daily before breakfast. 01/18/20   [provider]  [place] Use to test blood sugar 2-3 times daily as  instructed. Dx: E11.9 10/12/15   Philemon Kingdom, MD  Pseudoephedrine-guaiFENesin [facility] D PO) Take 1 tablet by mouth every 12 (twelve) hours.    [provider]  sacubitril-valsartan (ENTRESTO) 49-51 MG  Take 1 tablet by mouth 2 (two) times daily. 02/19/21   Freada Bergeron, MD  torsemide (DEMADEX) 20 MG tablet Take 40 mg by mouth daily. 02/28/21   Supple, Megan E, RPH-CPP  VENTOLIN HFA 108 (90 Base) MCG/ACT inhaler Inhale 1-2 puffs into the lungs every 4 (four) hours as needed for wheezing or shortness of breath.  07/12/15   [provider]    Allergies    Codeine and Demerol  Review of Systems   Review of Systems  Constitutional:  Positive for fatigue and fever. Negative for chills.  HENT:  Negative for sore throat.   Eyes:  Negative for visual disturbance.  Respiratory:  Positive for cough and shortness of breath.   Cardiovascular:  Negative for chest pain.  Gastrointestinal:  Negative for abdominal pain, diarrhea and nausea.  Genitourinary:  Negative for dysuria.  Musculoskeletal:  Positive for gait problem.  Skin:  Negative for rash.  Neurological:  Negative for headaches.   Physical Exam Updated Vital Signs BP (!) 169/77 (BP Location: Left Arm)   Pulse 82   Temp (!) 102.3 F (39.1 C) (Oral)   Resp (!) 24   Ht _0  (1.753 m)   Wt 108 kg   SpO2 99%   BMI 35.16 kg/m   Physical Exam Vitals and nursing note reviewed.  Constitutional:      Appearance: Normal appearance. He is well-developed.  HENT:     Head: Normocephalic and atraumatic.  Eyes:     Conjunctiva/sclera: Conjunctivae normal.  Cardiovascular:     Rate and Rhythm: Normal rate and regular rhythm.     Heart sounds: Murmur heard.  Pulmonary:     Effort: Pulmonary effort is normal. Tachypnea present. No respiratory distress.     Breath sounds: Normal breath sounds.  Abdominal:     Palpations: Abdomen is soft.     Tenderness: There is no abdominal tenderness. There is no guarding or  rebound.  Musculoskeletal:        General: No deformity. Normal range of motion.     Cervical back: Neck supple.     Right lower leg: Edema present.     Left lower leg: Edema present.  Skin:    General: Skin is warm and dry.  Neurological:     General: No focal deficit present.     Mental Status: He is alert.    ED Results / Procedures / Treatments   Labs (all labs ordered are listed, but only abnormal results are displayed) Labs Reviewed  RESP PANEL BY RT-PCR (FLU A&B, COVID) ARPGX2 - Abnormal; Notable for the following components:      Result Value   Influenza A by PCR POSITIVE (*)    All other components within normal limits  LACTIC ACID, PLASMA - Abnormal; Notable for the following components:   Lactic Acid, Venous 2.0 (*)    All other components within normal limits  LACTIC ACID, PLASMA - Abnormal; Notable for the following components:   Lactic Acid, Venous 2.1 (*)    All other components within normal limits  COMPREHENSIVE METABOLIC PANEL - Abnormal; Notable for the following components:   Potassium 3.4 (*)    Glucose, Bld 67 (*)    All other components within normal limits  CBC WITH DIFFERENTIAL/PLATELET - Abnormal; Notable for the following components:   RBC 3.98 (*)    Hemoglobin 8.1 (*)    HCT 27.6 (*)    MCV 69.3 (*)    MCH 20.4 (*)  MCHC 29.3 (*)    RDW 19.0 (*)    All other components within normal limits  PROTIME-INR - Abnormal; Notable for the following components:   Prothrombin Time 17.5 (*)    INR 1.4 (*)    All other components within normal limits  CULTURE, BLOOD (ROUTINE X 2)  CULTURE, BLOOD (ROUTINE X 2)  URINE CULTURE  APTT  URINALYSIS, ROUTINE W REFLEX MICROSCOPIC  BRAIN NATRIURETIC PEPTIDE  PROCALCITONIN  HEMOGLOBIN A1C  PROTIME-INR  CORTISOL-AM, BLOOD  LACTIC ACID, PLASMA  LACTIC ACID, PLASMA  IRON AND TIBC  PROCALCITONIN    EKG EKG Interpretation  Date/Time:  Friday May 18 2021 14:28:20 EST Ventricular Rate:  72 PR  Interval:    QRS Duration: 115 QT Interval:  402 QTC Calculation: 440 R Axis:   -52 Text Interpretation: Atrial fibrillation Left anterior fascicular block Anteroseptal infarct, old Nonspecific T abnormalities, lateral leads Confirmed by Aletta Edouard [phone]) on 05/18/2021 3:12:05 PM  Radiology DG Chest Port 1 View  Result Date: 05/18/2021 CLINICAL DATA:  Questionable sepsis - evaluate for abnormality EXAM: PORTABLE CHEST 1 VIEW COMPARISON:  Radiograph and CT 11/08/2020 FINDINGS: Patient is post median sternotomy. Prosthetic aortic valve. Left atrial clipping. Cardiomegaly is similar. Improved left basilar aeration with decreased pleural effusion. No acute airspace disease. No pulmonary edema. No pneumothorax. Stable osseous structures. IMPRESSION: 1. Improved left basilar aeration with decreased pleural effusion. 2. Stable cardiomegaly. Electronically Signed   By: Keith Rake M.D.   On: 05/18/2021 15:16    Procedures .Critical Care Performed by: Hayden Rasmussen, MD Authorized by: Hayden Rasmussen, MD   Critical care provider statement:    Critical care time (minutes):  45   Critical care time was exclusive of:  Separately billable procedures and treating other patients   Critical care was necessary to treat or prevent imminent or life-threatening deterioration of the following conditions:  Sepsis   Critical care was time spent personally by me on the following activities:  Development of treatment plan with patient or surrogate, discussions with consultants, evaluation of patient's response to treatment, examination of patient, obtaining history from patient or surrogate, ordering and performing treatments and interventions, ordering and review of laboratory studies, ordering and review of radiographic studies, pulse oximetry, re-evaluation of patient's condition and review of old charts   I assumed direction of critical care for this patient from another provider in my specialty:  no     Medications Ordered in ED Medications  azithromycin (ZITHROMAX) 500 mg in sodium chloride 0.9 % 250 mL IVPB (has no administration in time range)  oseltamivir (TAMIFLU) capsule 75 mg (has no administration in time range)  guaiFENesin (MUCINEX) 12 hr tablet 1,200 mg (has no administration in time range)  albuterol (PROVENTIL) (2.5 MG/3ML) 0.083% nebulizer solution 2.5 mg (has no administration in time range)  insulin aspart (novoLOG) injection 0-15 Units (has no administration in time range)  0.9 %  sodium chloride infusion (has no administration in time range)  acetaminophen (TYLENOL) tablet 650 mg (has no administration in time range)    Or  acetaminophen (TYLENOL) suppository 650 mg (has no administration in time range)  docusate sodium (COLACE) capsule 100 mg (has no administration in time range)  lactated ringers bolus 1,000 mL (0 mLs Intravenous Stopped 05/18/21 1636)  acetaminophen (TYLENOL) tablet 650 mg (650 mg Oral Given 05/18/21 1456)  cefTRIAXone (ROCEPHIN) 1 g in sodium chloride 0.9 % 100 mL IVPB (0 g Intravenous Stopped 05/18/21 1713)    ED Course  I have reviewed the triage vital signs and the nursing notes.  Pertinent labs & imaging results that were available during my care of the patient were reviewed by me and considered in my medical decision making (see chart for details).  Clinical Course as of 05/18/21 1921  Ludwig Clarks May 18, 2021  1516 X-ray interpreted by me as cardiomegaly no clear infiltrates.  Awaiting radiology reading. [MB]  1608 Discussed with Dr. Marylyn Ishihara [facility] hospitalist who will evaluate the patient for admission. [MB]    Clinical Course User Index [MB] Hayden Rasmussen, MD   MDM Rules/Calculators/A&P                          Mason Patton was evaluated in Emergency Department on 05/18/2021 for the symptoms described in the history of present illness. He was evaluated in the context of the global COVID-19 pandemic, which necessitated consideration  that the patient might be at risk for infection with the SARS-CoV-2 virus that causes COVID-19. Institutional protocols and algorithms that pertain to the evaluation of patients at risk for COVID-19 are in a state of rapid change based on information released by regulatory bodies including the CDC and federal and state organizations. These policies and algorithms were followed during the patient's care in the ED.  This patient complains of cough shortness of breath fevers altered mental status; this involves an extensive number of treatment Options and is a complaint that carries with it a high risk of complications and Morbidity. The differential includes COVID, flu, pneumonia, hypoxia, metabolic derangement  I ordered, reviewed and interpreted labs, which included CBC with normal white count, hemoglobin slightly lower than baseline, chemistries fairly normal other than mildly low glucose, flu positive COVID-negative, lactate elevated will need to be trended, blood culture sent I ordered medication IV fluids IV antibiotics I ordered imaging studies which included chest x-ray and I independently    visualized and interpreted imaging which showed no acute infiltrate Additional history obtained from EMS and patient's wife Previous records obtained and reviewed in epic no recent admissions I consulted Dr. Marylyn Ishihara [facility] hospitalist and discussed lab and imaging findings  Critical Interventions: Management of patient's SIRS criteria possible early sepsis with IV antibiotics and fluids  After the interventions stated above, I reevaluated the patient and found patient to be symptomatically improved as with effervescence but still with increased work of breathing.  Wife uncomfortable with him going home for his inability to ambulate and transfer.  Discussed with [facility] hospitalist Dr. Marylyn Ishihara who will evaluate the patient for possible admission.   Final Clinical Impression(s) / ED Diagnoses Final diagnoses:   Influenza A  General weakness  SIRS (systemic inflammatory response syndrome) ([place])    Rx / DC Orders ED Discharge Orders     None        Hayden Rasmussen, MD 05/18/21 1924

## 2021-05-19 DIAGNOSIS — J111 Influenza due to unidentified influenza virus with other respiratory manifestations: Secondary | ICD-10-CM

## 2021-05-19 LAB — BASIC METABOLIC PANEL
Anion gap: 8 (ref 5–15)
BUN: 12 mg/dL (ref 8–23)
CO2: 26 mmol/L (ref 22–32)
Calcium: 8.6 mg/dL — ABNORMAL LOW (ref 8.9–10.3)
Chloride: 98 mmol/L (ref 98–111)
Creatinine, Ser: 0.81 mg/dL (ref 0.61–1.24)
GFR, Estimated: >60 mL/min (ref >60)
Glucose, Bld: 91 mg/dL (ref 70–99)
Potassium: 3.3 mmol/L — ABNORMAL LOW (ref 3.5–5.1)
Sodium: 132 mmol/L — ABNORMAL LOW (ref 135–145)

## 2021-05-19 LAB — CBC
HCT: 25.6 % — ABNORMAL LOW (ref 39.0–52.0)
Hemoglobin: 7.5 g/dL — ABNORMAL LOW (ref 13.0–17.0)
MCH: 20.6 pg — ABNORMAL LOW (ref 26.0–34.0)
MCHC: 29.3 g/dL — ABNORMAL LOW (ref 30.0–36.0)
MCV: 70.3 fL — ABNORMAL LOW (ref 80.0–100.0)
Platelets: 186 10*3/uL (ref 150–400)
RBC: 3.64 MIL/uL — ABNORMAL LOW (ref 4.22–5.81)
RDW: 19.2 % — ABNORMAL HIGH (ref 11.5–15.5)
WBC: 6.8 10*3/uL (ref 4.0–10.5)
nRBC: 0 % (ref 0.0–0.2)

## 2021-05-19 LAB — GLUCOSE, CAPILLARY
Glucose-Capillary: 83 mg/dL (ref 70–99)
Glucose-Capillary: 126 mg/dL — ABNORMAL HIGH (ref 70–99)

## 2021-05-19 LAB — LACTIC ACID, PLASMA: Lactic Acid, Venous: 0.9 mmol/L (ref 0.5–1.9)

## 2021-05-19 LAB — HEMOGLOBIN A1C
Hgb A1c MFr Bld: 6.4 % — ABNORMAL HIGH (ref 4.8–5.6)
Mean Plasma Glucose: 136.98 mg/dL

## 2021-05-19 LAB — HEMOGLOBIN AND HEMATOCRIT, BLOOD
HCT: 26 % — ABNORMAL LOW (ref 39.0–52.0)
Hemoglobin: 7.4 g/dL — ABNORMAL LOW (ref 13.0–17.0)

## 2021-05-19 LAB — PROCALCITONIN: Procalcitonin: <0.1 ng/mL

## 2021-05-19 MED ORDER — OSELTAMIVIR PHOSPHATE 75 MG PO CAPS
75.0000 mg | ORAL_CAPSULE | Freq: Two times a day (BID) | ORAL | 0 refills | Status: AC
Start: 2021-05-19 — End: 2021-05-23

## 2021-05-19 MED ORDER — OSELTAMIVIR PHOSPHATE 75 MG PO CAPS: 75.0000 mg | ORAL_CAPSULE | Freq: Two times a day (BID) | ORAL | 0 refills | Status: DC

## 2021-05-19 NOTE — Discharge Instructions (Addendum)
Mason Patton,  You were in the hospital with influenza. Thankfully there was no pneumonia. You have improved significantly. Your blood count trended down. We discussed this but you have told me that there is a plan for colonoscopy as an outpatient. You have no symptoms of low blood counts. If you develop bad fatigue/trouble breathing when moving around, please call your doctor and/or return for reevaluation and lab work. I have discharged you with Tamiflu.

## 2021-05-19 NOTE — Progress Notes (Signed)
Ambulation test:  Patient walked the length of hallway with oxygen saturation dropping to 88 for a brief few seconds and then coming back up to the low to mid 90's for the majority of the test. Patient said he felt fine and tolerated the walk well.

## 2021-05-19 NOTE — Discharge Summary (Addendum)
Physician Discharge Summary  Mason Patton JME:[id number] DOB: [birth year] DOA: 05/18/2021  PCP: Heywood Bene, PA-C  Admit date: 05/18/2021 Discharge date: 05/19/2021  Admitted From: Home Disposition: Home  Recommendations for Outpatient Follow-up:  Follow up with PCP in 1 week Please obtain CBC in 2-3 days Please follow up on the following pending results: Blood and urine cultures  Home Health: None Equipment/Devices: None  Discharge Condition: Stable CODE STATUS: Full code Diet recommendation: Heart healthy/Carb modified   Brief/Interim Summary:  Admission HPI written by [name], DO  HPI: Mason Patton is a 75 y.o. male with medical history significant of chronic combined HF, a fib, CAD s/p CABG, HTN, HLD, DM2. Presenting with cough and fever. Symptoms started 5 days ago. He had increased cough w/ some difficulty breathing. [facility]. He was started on amoxicillin and tessalon pearls. Per wife, he was later notified that he was flu positive, but did not receive antiviral therapy. His cough worsened. He began to complain of achiness and decreased appetite. He was noted to have fever at home. His wife became concerned this morning as his symptoms did not improve. So they came to the ED for help. He denies any other aggravating or alleviating factors.   Hospital course:  Influenza A infection No associated pneumonia. Dyspnea on admission which has improved. Patient empirically treated with Tamiflu which was prescribed on discharge.  Sepsis Secondary to above. Blood cultures with no growth to date. Procalcitonin undetectable. Unlikely bacteremia. Treatment of influenza as mentioned above.  Chronic microcytic anemia Iron deficiency anemia Acute anemia Acute anemia likely secondary to hemodilution. Hemoglobin stable on discharge. Patient states he already has a plan for colonoscopy and will follow-up. No symptoms of anemia present prior  to discharge. Complicated by Eliquis use, but no evidence of acute bleed. Continue home iron supplementation.  Diabetes mellitus, type 2 Patient with mild hypoglycemia on admission. Hemoglobin A1C of 6.4%.  Well controlled especially for age. Discontinued glipizide. 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  Discharge Diagnoses:  Active Problems:   Hypertension   OSA on CPAP   Flu    Discharge Instructions  Discharge Instructions     Call MD for:  difficulty breathing, headache or visual disturbances   Complete by: As directed       Allergies as of 05/19/2021       Reactions   Codeine Nausea And Vomiting   Demerol Nausea And Vomiting        Medication List     STOP taking these medications    amoxicillin 125 MG chewable tablet Commonly known as: AMOXIL   amoxicillin 500 MG tablet Commonly known as: AMOXIL   glipiZIDE 10 MG tablet Commonly known as: GLUCOTROL   ibuprofen 200 MG tablet Commonly known as: ADVIL       TAKE these medications    acetaminophen 325 MG tablet Commonly known as: TYLENOL Take 2 tablets (650 mg total) by mouth every 4 (four) hours as needed for headache or mild pain.   apixaban 5 MG Tabs tablet Commonly known as: ELIQUIS Take 5 mg by mouth 2 (two) times daily.   aspirin 81 MG EC tablet Take 1 tablet (81 mg total) by mouth daily. Swallow whole.   atorvastatin 40 MG tablet Commonly known as: LIPITOR Take 40 mg by mouth daily  in the afternoon.   benzonatate 100 MG capsule Commonly known as: TESSALON Take 100 mg by mouth 3 (three) times daily as needed for cough.   Centrum Silver 50+Men Tabs Take 1 tablet by mouth daily with breakfast.   PRESERVISION AREDS PO Take 1 capsule by mouth 2 (two) times daily.   cholecalciferol 25 MCG (1000 UNIT) tablet Commonly  known as: VITAMIN D3 Take 1,000 Units by mouth daily.   empagliflozin 10 MG Tabs tablet Commonly known as: Jardiance Take 1 tablet (10 mg total) by mouth daily before breakfast.   Entresto 49-51 MG Generic drug: sacubitril-valsartan Take 1 tablet by mouth 2 (two) times daily.   ferrous sulfate 325 (65 FE) MG tablet Take 325 mg by mouth every Monday, Wednesday, and Friday.   glucose blood test strip Commonly known as: OneTouch Verio Use to test blood sugar 2-3 times daily as instructed. Dx: E11.9   HYDROcodone-acetaminophen 5-325 MG tablet Commonly known as: NORCO/VICODIN Take 1 tablet by mouth every 6 (six) hours as needed for moderate pain.   levothyroxine 200 MCG tablet Commonly known as: Synthroid Take 1 tablet (200 mcg total) by mouth daily. What changed: when to take this   metFORMIN 500 MG tablet Commonly known as: GLUCOPHAGE Take 1,000 mg by mouth 2 (two) times daily with a meal.   metoprolol succinate 100 MG 24 hr tablet Commonly known as: TOPROL-XL Take 1 tablet (100 mg total) by mouth daily. Take with or immediately following a meal. What changed: Another medication with the same name was removed. Continue taking this medication, and follow the directions you see here.   MUCINEX D PO Take 1 tablet by mouth every 12 (twelve) hours as needed (for coughing).   nitroGLYCERIN 0.4 MG SL tablet Commonly known as: NITROSTAT Place 0.4 mg under the tongue every 5 (five) minutes as needed for chest pain.   nortriptyline 10 MG capsule Commonly known as: PAMELOR Take 10 mg by mouth See admin instructions. Take 10 mg by mouth 30-45 minutes prior to bedtime for overactive intestinal muscles   omeprazole 20 MG capsule Commonly known as: PRILOSEC Take 20 mg by mouth daily before breakfast.   OneTouch Delica Lancets Fine Misc Use to test blood sugar 2-3 times daily as instructed. Dx: E11.9   OneTouch Verio Flex System w/Device Kit 1 each by Does not apply route daily.  Dx: E11.9   oseltamivir 75 MG capsule Commonly known as: TAMIFLU Take 1 capsule (75 mg total) by mouth 2 (two) times daily for 4 days.   torsemide 20 MG tablet Commonly known as: DEMADEX Take 20 mg by mouth See admin instructions. Take 20 mg by mouth in the morning and may take an additional 20 mg once a day as directed for a period of 3 days, for an overnight weight gain of 5 pounds   Ventolin HFA 108 (90 Base) MCG/ACT inhaler Generic drug: albuterol Inhale 1-2 puffs into the lungs every 4 (four) hours as needed for wheezing or shortness of breath.   Vitamin B12 500 MCG Tabs Take 500 mcg by mouth 2 (two) times daily.        Follow-up Information     Heywood Bene, PA-C. Schedule an appointment as soon as possible for a visit in 1 week(s).   Specialty: Physician Assistant Why: For hospital follow-up Contact information: [address] [place] [address] [phone]                Allergies  Allergen Reactions  Codeine Nausea And Vomiting   Demerol Nausea And Vomiting    Consultations: None   Procedures/Studies: DG Chest Port 1 View  Result Date: 05/18/2021 CLINICAL DATA:  Questionable sepsis - evaluate for abnormality EXAM: PORTABLE CHEST 1 VIEW COMPARISON:  Radiograph and CT 11/08/2020 FINDINGS: Patient is post median sternotomy. Prosthetic aortic valve. Left atrial clipping. Cardiomegaly is similar. Improved left basilar aeration with decreased pleural effusion. No acute airspace disease. No pulmonary edema. No pneumothorax. Stable osseous structures. IMPRESSION: 1. Improved left basilar aeration with decreased pleural effusion. 2. Stable cardiomegaly. Electronically Signed   By: Keith Rake M.D.   On: 05/18/2021 15:16      Subjective: No issues this morning. No dyspnea or chest pain. Cough improved.   Discharge Exam: Vitals:   05/19/21 0956 05/19/21 1117  BP: (!) 142/72 137/67  Pulse: 67 61  Resp:  18  Temp:  98.2 F (36.8  C)  SpO2:  98%   Vitals:   05/19/21 0128 05/19/21 0519 05/19/21 0956 05/19/21 1117  BP: 134/65 (!) 155/67 (!) 142/72 137/67  Pulse: (!) 56 66 67 61  Resp: _0 Temp: 99.3 F (37.4 C) 99.3 F (37.4 C)  98.2 F (36.8 C)  TempSrc: Oral Oral  Axillary  SpO2: 96% 97%  98%  Weight:      Height:        General: Pt is alert, awake, not in acute distress Cardiovascular: RRR, S1/S2 +, no rubs, no gallops. Systolic murmur Respiratory: CTA bilaterally, no wheezing, no rhonchi Abdominal: Soft, NT, ND, bowel sounds + Extremities: no edema, no cyanosis    The results of significant diagnostics from this hospitalization (including imaging, microbiology, ancillary and laboratory) are listed below for reference.     Microbiology: Recent Results (from the past 240 hour(s))  Blood Culture (routine x 2)     Status: None (Preliminary result)   Collection Time: 05/18/21  2:31 PM   Specimen: BLOOD  Result Value Ref Range Status   Specimen Description   Final    BLOOD LEFT ANTECUBITAL Performed at [facility] [address] [place], [place] [address]    Special Requests   Final    BOTTLES DRAWN AEROBIC AND ANAEROBIC Blood Culture adequate volume Performed at [facility] [address], [place], [place] [address]    Culture   Final    NO GROWTH < 24 HOURS Performed at [facility] [address] [place], [place] [address]    Report Status PENDING  Incomplete  Resp Panel by RT-PCR (Flu A&B, Covid) Nasopharyngeal Swab     Status: Abnormal   Collection Time: 05/18/21  2:36 PM   Specimen: Nasopharyngeal Swab; Nasopharyngeal(NP) swabs in vial transport medium  Result Value Ref Range Status   SARS Coronavirus 2 by RT PCR NEGATIVE NEGATIVE Final    Comment: (NOTE) SARS-CoV-2 target nucleic acids are NOT DETECTED.  The SARS-CoV-2 RNA is generally detectable in upper respiratory specimens during the acute phase of infection. The  lowest concentration of SARS-CoV-2 viral copies this assay can detect is 138 copies/mL. A negative result does not preclude SARS-Cov-2 infection and should not be used as the sole basis for treatment or other patient management decisions. A negative result may occur with  improper specimen collection/handling, submission of specimen other than nasopharyngeal swab, presence of viral mutation(s) within the areas targeted by this assay, and inadequate number of viral copies(<138 copies/mL). A negative result must be combined with clinical  observations, patient history, and epidemiological information. The expected result is Negative.  Fact Sheet for Patients:  EntrepreneurPulse.com.au  Fact Sheet for Healthcare Providers:  IncredibleEmployment.be  This test is no t yet approved or cleared by the [place] FDA and  has been authorized for detection and/or diagnosis of SARS-CoV-2 by FDA under an Emergency Use Authorization (EUA). This EUA will remain  in effect (meaning this test can be used) for the duration of the COVID-19 declaration under Section 564(b)(1) of the Act, 21 U.S.C.section 360bbb-3(b)(1), unless the authorization is terminated  or revoked sooner.       Influenza A by PCR POSITIVE (A) NEGATIVE Final   Influenza B by PCR NEGATIVE NEGATIVE Final    Comment: (NOTE) The Xpert Xpress SARS-CoV-2/FLU/RSV plus assay is intended as an aid in the diagnosis of influenza from Nasopharyngeal swab specimens and should not be used as a sole basis for treatment. Nasal washings and aspirates are unacceptable for Xpert Xpress SARS-CoV-2/FLU/RSV testing.  Fact Sheet for Patients: EntrepreneurPulse.com.au  Fact Sheet for Healthcare Providers: IncredibleEmployment.be  This test is not yet approved or cleared by the [place] FDA and has been authorized for detection and/or diagnosis of SARS-CoV-2 by FDA  under an Emergency Use Authorization (EUA). This EUA will remain in effect (meaning this test can be used) for the duration of the COVID-19 declaration under Section 564(b)(1) of the Act, 21 U.S.C. section 360bbb-3(b)(1), unless the authorization is terminated or revoked.  Performed at [facility], [place] [address] [place], [place] [address]   Blood Culture (routine x 2)     Status: None (Preliminary result)   Collection Time: 05/18/21  6:39 PM   Specimen: BLOOD  Result Value Ref Range Status   Specimen Description   Final    BLOOD BLOOD RIGHT HAND Performed at [facility] [address], [place], [place] [address]    Special Requests   Final    BOTTLES DRAWN AEROBIC ONLY Blood Culture results may not be optimal due to an inadequate volume of blood received in culture bottles Performed at [facility] [address], [place], [place] [address]    Culture   Final    NO GROWTH < 12 HOURS Performed at [facility] [address] [place], [place] [address]    Report Status PENDING  Incomplete     Labs: BNP (last 3 results) Recent Labs    11/05/20 0729 11/08/20 0601 05/18/21 1839  BNP 988.4* 1,095.9* 222.9*   Basic Metabolic Panel: Recent Labs  Lab 05/18/21 1431 05/19/21 0752  NA 135 132*  K 3.4* 3.3*  CL 99 98  CO2 28 26  GLUCOSE 67* 91  BUN 12 12  CREATININE 1.00 0.81  CALCIUM 8.9 8.6*   Liver Function Tests: Recent Labs  Lab 05/18/21 1431  AST 18  ALT 11  ALKPHOS 68  BILITOT 0.8  PROT 6.6  ALBUMIN 3.6   No results for input(s): LIPASE, AMYLASE in the last 168 hours. No results for input(s): AMMONIA in the last 168 hours. CBC: Recent Labs  Lab 05/18/21 1431 05/19/21 0752 05/19/21 1102  WBC 6.5 6.8  --   NEUTROABS 4.8  --   --   HGB 8.1* 7.5* 7.4*  HCT 27.6* 25.6* 26.0*  MCV 69.3* 70.3*  --   PLT 199 186  --    Cardiac Enzymes: No results for input(s): CKTOTAL, CKMB,  CKMBINDEX, TROPONINI in the last 168 hours. BNP: Invalid input(s):  POCBNP CBG: Recent Labs  Lab 05/19/21 0742 05/19/21 1110  GLUCAP 83 126*   D-Dimer No results for input(s): DDIMER in the last 72 hours. Hgb A1c Recent Labs    05/18/21 1911  HGBA1C 6.4*   Lipid Profile No results for input(s): CHOL, HDL, LDLCALC, TRIG, CHOLHDL, LDLDIRECT in the last 72 hours. Thyroid function studies No results for input(s): TSH, T4TOTAL, T3FREE, THYROIDAB in the last 72 hours.  Invalid input(s): FREET3 Anemia work up Recent Labs    05/18/21 1911  TIBC 429  IRON 17*   Urinalysis    Component Value Date/Time   COLORURINE YELLOW 05/18/2021 2056   APPEARANCEUR CLEAR 05/18/2021 2056   LABSPEC 1.013 05/18/2021 2056   PHURINE 5.0 05/18/2021 2056   GLUCOSEU 50 (A) 05/18/2021 2056   HGBUR NEGATIVE 05/18/2021 2056   BILIRUBINUR NEGATIVE 05/18/2021 2056   [place] NEGATIVE 05/18/2021 2056   PROTEINUR NEGATIVE 05/18/2021 2056   UROBILINOGEN 2.0 (H) 03/22/2013 2304   NITRITE NEGATIVE 05/18/2021 2056   LEUKOCYTESUR NEGATIVE 05/18/2021 2056   Sepsis Labs Invalid input(s): PROCALCITONIN,  WBC,  LACTICIDVEN Microbiology Recent Results (from the past 240 hour(s))  Blood Culture (routine x 2)     Status: None (Preliminary result)   Collection Time: 05/18/21  2:31 PM   Specimen: BLOOD  Result Value Ref Range Status   Specimen Description   Final    BLOOD LEFT ANTECUBITAL Performed at [facility], [place], [place] [address] [place], [place] [address]    Special Requests   Final    BOTTLES DRAWN AEROBIC AND ANAEROBIC Blood Culture adequate volume Performed at [facility] [address] [place], [place] [address]    Culture   Final    NO GROWTH < 24 HOURS Performed at [facility] [address], [place], [place] [address]    Report Status PENDING  Incomplete  Resp Panel by RT-PCR (Flu A&B, Covid) Nasopharyngeal Swab     Status: Abnormal    Collection Time: 05/18/21  2:36 PM   Specimen: Nasopharyngeal Swab; Nasopharyngeal(NP) swabs in vial transport medium  Result Value Ref Range Status   SARS Coronavirus 2 by RT PCR NEGATIVE NEGATIVE Final    Comment: (NOTE) SARS-CoV-2 target nucleic acids are NOT DETECTED.  The SARS-CoV-2 RNA is generally detectable in upper respiratory specimens during the acute phase of infection. The lowest concentration of SARS-CoV-2 viral copies this assay can detect is 138 copies/mL. A negative result does not preclude SARS-Cov-2 infection and should not be used as the sole basis for treatment or other patient management decisions. A negative result may occur with  improper specimen collection/handling, submission of specimen other than nasopharyngeal swab, presence of viral mutation(s) within the areas targeted by this assay, and inadequate number of viral copies(<138 copies/mL). A negative result must be combined with clinical observations, patient history, and epidemiological information. The expected result is Negative.  Fact Sheet for Patients:  EntrepreneurPulse.com.au  Fact Sheet for Healthcare Providers:  IncredibleEmployment.be  This test is no t yet approved or cleared by the [place] FDA and  has been authorized for detection and/or diagnosis of SARS-CoV-2 by FDA under an Emergency Use Authorization (EUA). This EUA will remain  in effect (meaning this test can be used) for the duration of the COVID-19 declaration under Section 564(b)(1) of the Act, 21 U.S.C.section 360bbb-3(b)(1), unless the authorization is terminated  or revoked sooner.       Influenza A by PCR POSITIVE (A) NEGATIVE Final  Influenza B by PCR NEGATIVE NEGATIVE Final    Comment: (NOTE) The Xpert Xpress SARS-CoV-2/FLU/RSV plus assay is intended as an aid in the diagnosis of influenza from Nasopharyngeal swab specimens and should not be used as a sole basis for treatment.  Nasal washings and aspirates are unacceptable for Xpert Xpress SARS-CoV-2/FLU/RSV testing.  Fact Sheet for Patients: EntrepreneurPulse.com.au  Fact Sheet for Healthcare Providers: IncredibleEmployment.be  This test is not yet approved or cleared by the [place] FDA and has been authorized for detection and/or diagnosis of SARS-CoV-2 by FDA under an Emergency Use Authorization (EUA). This EUA will remain in effect (meaning this test can be used) for the duration of the COVID-19 declaration under Section 564(b)(1) of the Act, 21 U.S.C. section 360bbb-3(b)(1), unless the authorization is terminated or revoked.  Performed at [facility], [place] [address] [place], [place] [address]   Blood Culture (routine x 2)     Status: None (Preliminary result)   Collection Time: 05/18/21  6:39 PM   Specimen: BLOOD  Result Value Ref Range Status   Specimen Description   Final    BLOOD BLOOD RIGHT HAND Performed at [facility] [address], [place], [place] [address]    Special Requests   Final    BOTTLES DRAWN AEROBIC ONLY Blood Culture results may not be optimal due to an inadequate volume of blood received in culture bottles Performed at [facility] [address], [place], [place] [address]    Culture   Final    NO GROWTH < 12 HOURS Performed at [facility] [address], [place], [place] [address]    Report Status PENDING  Incomplete     Time coordinating discharge: 35 minutes  SIGNED:   Cordelia Poche, MD [facility] 05/19/2021, 1:13 PM

## 2021-05-20 LAB — URINE CULTURE: Culture: NO GROWTH

## 2021-05-23 LAB — CULTURE, BLOOD (ROUTINE X 2)
Culture: NO GROWTH
Culture: NO GROWTH
Special Requests: ADEQUATE

## 2021-06-11 DIAGNOSIS — I4819 Other persistent atrial fibrillation: Secondary | ICD-10-CM | POA: Insufficient documentation

## 2021-06-11 NOTE — Progress Notes (Addendum)
Cardiology Office Note:    Date:  06/12/2021   ID:  Mason Patton, DOB March 05, 1946, MRN [id number]  PCP:  Heywood Bene, PA-C   [facility] Providers Cardiologist:  Freada Bergeron, MD    Referring MD: Heywood Bene, *   Chief Complaint:  Follow-up for CHF, atrial fibrillation, CAD    Patient Profile:   Mason Patton is a 75 y.o. male with:  Persistent atrial fibrillation  Coronary artery disease  Aortic stenosis  S/p CABG (L-LAD, S-PDA), bioprosthetic AVR and LAA clip in 09/2020 (HFpEF) heart failure with preserved ejection fraction  Carotid artery disease [facility] - 4/21: RICA 50-69 Hypertension  Diabetes mellitus  Hyperlipidemia  +Cigs - quit in 2014 Admx in 5/22 w Staph epidermidis bacteremia  Emphysema Hypothyroidism  OSA on CPAP  Hx of CVA   History of Present Illness: Mason Patton was last seen by Dr. Johney Frame in 7/22.  His diuretics were adjusted from furosemide to torsemide at that time.  He had f/u in the PharmD clinic for titration of GMDT.  His ARB was changed to [facility].  His Carvedilol was changed to Metoprolol succinate due to symptoms of dizziness with low BP.  He had increased volume on lower dose Torsemide and this was changed back to torsemide 40 mg once daily.  He returns for f/u.  He is here with his wife.  Unfortunately, he fell stepping on the curb in the parking lot.  He did not have any major injuries.  He did scratch his face.  He has some soreness over his left rib area.  Since last seen, he has not had significant shortness of breath.  His weight is up 8 pounds.  His cardiologist at the [facility] recently told to cut back on his torsemide to 20 mg a day.  He has not had orthopnea, syncope, chest pain.  ASSESSMENT & PLAN:   Carotid artery disease ([place]) Managed by [facility].  His carotid US in 3/22 demonstrated 40-59% right ICA stenosis.  Hyperlipidemia Continue atorvastatin 40 mg daily.  Obtain recent lipid panel  from his doctor at the [place].  Persistent atrial fibrillation ([place]) He has a history of chronic anemia.  He has been evaluated by hematology at the [facility].  He has had a recent colonoscopy and endoscopy.  It sounds like he is going to be set up for iron infusions soon.  Currently, he is able to remain on anticoagulation.  Continue apixaban 5 mg twice daily.  Hypertension Blood pressures at home are usually better controlled.  Continue metoprolol succinate 100 mg daily, Entresto 49/51 mg twice daily.  Start spironolactone as noted.  (HFpEF) heart failure with preserved ejection fraction (HCC) Echocardiogram in 5/22 with EF 55%.  NYHA IIb.  Volume status currently stable.  He does note a recent 8 pound weight gain since his torsemide was cut back to 20 mg daily.  He plans to take an extra dose of torsemide over the next 2 to 3 days.  Continue empagliflozin 10 mg daily, metoprolol succinate 100 mg daily, Entresto 49/51 mg twice daily.  Start spironolactone 12.5 mg daily.  Obtain BMET once weekly x2.  Coronary artery disease Status post CABG in March 2022.  He is doing well without anginal symptoms.  Continue aspirin 81 mg daily, atorvastatin 40 mg daily.  Aortic stenosis, severe Status post bioprosthetic AVR.  His echocardiogram in May demonstrated a normally functioning AVR.  Continue SBE prophylaxis.  Fall As noted, he fell in  our parking lot stepping on the curb.  He lost his balance.  He does not have any significant finding on chest exam.  There is no deformity or significant tenderness to palpation.  He does have an abrasion on his left face.  I offered to send him for a rib x-ray but he declines at this time.  Of asked him to contact us if his symptoms should change over the next couple of days.  Anemia As noted, he has been followed at the [facility] for the past couple of years for anemia.  He notes his recent hemoglobin was in the 8 range.  He has had a recent endoscopy and colonoscopy.  He is also  getting set up for iron infusions.            Dispo:  Return in about 3 months (around 09/10/2021) for Routine Follow Up with Dr. Johney Frame.    Prior CV studies: Transesophageal echocardiogram 11/16/2020 EF 60-65, no RWMA, normal RVSF, mild LAE, mild MR normally functioning bioprosthetic AVR with trivial AI  Echocardiogram 11/08/2020 Inferior HK, EF 55, mild LVH, normal RVSF, mild LAE, pericardial effusion, trivial MR, normally functioning AVR with no PVL  Pre-CABG Dopplers 09/08/2020 R ICA 40-59; L ICA 1-39     Past Medical History:  Diagnosis Date  . Aortic stenosis    09/30/19 echo ([facility], Cardiologist Dr. Geraldo Pitter): Mild-moderate AS, MaxVel 308 cm/s, MeanPG 22 mmHg, AVA 1.6 cm2  . Arthritis   . Atrial fibrillation ([place])   . Carotid artery stenosis    11/03/19 [place] ([facility]): 50-69% RICA stenosis  . Cholecystitis with cholelithiasis   . Eczema   . Emphysema of lung ([place])   . Gallstones and inflammation of gallbladder without obstruction   . Heart murmur   . History of nuclear stress test 02/23/2010   dipyridamole; normal pattern of perfusion in all regions; normal, low risk study   . Hyperlipidemia   . Hypertension   . 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 Meningoencephalitis   . Obesity   . OSA on CPAP    last sleep study >8 yrs  . Pneumonia    "twice" (05/19/2013)  . Shortness of breath   . Stroke [facility])    "they said I had a minor one when I had menigitis" (05/19/2013)  . Thyroid disease   . Tobacco abuse    quit 11/08/2012  . Type II diabetes mellitus ([place])   . Umbilical hernia    Current Medications: Current Meds  Medication Sig  . acetaminophen (TYLENOL) 325 MG tablet Take 2 tablets (650 mg total) by mouth every 4 (four) hours as needed for headache or mild pain.  [name] apixaban (ELIQUIS) 5 MG TABS tablet Take 5 mg by mouth 2 (two) times daily.  [name] aspirin EC 81 MG EC tablet Take 1 tablet (81 mg total) by mouth daily.  Swallow whole.  [name] atorvastatin (LIPITOR) 40 MG tablet Take 40 mg by mouth daily in the afternoon.  . benzonatate (TESSALON) 100 MG capsule Take 100 mg by mouth 3 (three) times daily as needed for cough.  . Blood Glucose Monitoring Suppl (ONETOUCH VERIO FLEX SYSTEM) W/DEVICE KIT 1 each by Does not apply route daily. Dx: E11.9  . cholecalciferol (VITAMIN D3) 25 MCG (1000 UNIT) tablet Take 1,000 Units by mouth daily.  . Cyanocobalamin (VITAMIN B12) 500 MCG TABS Take 500 mcg by mouth 2 (two) times daily.  . empagliflozin (JARDIANCE) 10 MG TABS tablet Take 1  tablet (10 mg total) by mouth daily before breakfast.  . ferrous sulfate 325 (65 FE) MG tablet Take 325 mg by mouth every Monday, Wednesday, and Friday.  [name] glipiZIDE (GLUCOTROL) 10 MG tablet Take 10 mg by mouth 2 (two) times daily before a meal.  . glucose blood (ONETOUCH VERIO) test strip Use to test blood sugar 2-3 times daily as instructed. Dx: E11.9  . HYDROcodone-acetaminophen (NORCO/VICODIN) 5-325 MG tablet Take 1 tablet by mouth every 6 (six) hours as needed for moderate pain.  [name] levothyroxine (SYNTHROID) 200 MCG tablet Take 1 tablet (200 mcg total) by mouth daily.  . metFORMIN (GLUCOPHAGE) 500 MG tablet Take 1,000 mg by mouth 2 (two) times daily with a meal.  . metoprolol succinate (TOPROL-XL) 100 MG 24 hr tablet Take 1 tablet (100 mg total) by mouth daily. Take with or immediately following a meal.  . Multiple Vitamins-Minerals (CENTRUM SILVER 50+MEN) TABS Take 1 tablet by mouth daily with breakfast.  . Multiple Vitamins-Minerals (PRESERVISION AREDS PO) Take 1 capsule by mouth 2 (two) times daily.  . nitroGLYCERIN (NITROSTAT) 0.4 MG SL tablet Place 0.4 mg under the tongue every 5 (five) minutes as needed for chest pain.  . nortriptyline (PAMELOR) 10 MG capsule Take 10 mg by mouth See admin instructions. Take 10 mg by mouth 30-45 minutes prior to bedtime for overactive intestinal muscles  . omeprazole (PRILOSEC) 20 MG capsule Take 20 mg by  mouth daily before breakfast.  . ONETOUCH DELICA LANCETS FINE MISC Use to test blood sugar 2-3 times daily as instructed. Dx: E11.9  . Pseudoephedrine-guaiFENesin (MUCINEX D PO) Take 1 tablet by mouth every 12 (twelve) hours as needed (for coughing).  . sacubitril-valsartan (ENTRESTO) 49-51 MG Take 1 tablet by mouth 2 (two) times daily.  [name] spironolactone (ALDACTONE) 25 MG tablet Take 0.5 tablets (12.5 mg total) by mouth daily.  [name] torsemide (DEMADEX) 20 MG tablet Take 20 mg by mouth See admin instructions. Take 20 mg by mouth in the morning and may take an additional 20 mg once a day as directed for a period of 3 days, for an overnight weight gain of 5 pounds  . VENTOLIN HFA 108 (90 Base) MCG/ACT inhaler Inhale 1-2 puffs into the lungs every 4 (four) hours as needed for wheezing or shortness of breath.     Allergies:   Codeine and Demerol   Social History   Tobacco Use  . Smoking status: Every Day    Packs/day: 0.50    Years: 50.00    Pack years: 25.00    Types: Cigarettes  . Smokeless tobacco: Never  Vaping Use  . Vaping Use: Never used  Substance Use Topics  . Alcohol use: No  . Drug use: No    Family Hx: The patient's family history includes Asthma in his child; Cancer in his maternal aunt and maternal aunt; Diabetes in his maternal grandmother; Diabetes type I in his child; Hyperlipidemia in his father; Thyroid disease in his mother.  ROS see HPI  EKGs/Labs/Other Test Reviewed:    EKG:  EKG is not ordered today.  The ekg ordered today demonstrates n/a  Recent Labs: 11/05/2020: TSH 0.185 11/11/2020: Magnesium 1.8 01/18/2021: NT-Pro BNP 3,251 05/18/2021: ALT 11; B Natriuretic Peptide 761.0 05/19/2021: BUN 12; Creatinine, Ser 0.81; Hemoglobin 7.4; Platelets 186; Potassium 3.3; Sodium 132   Recent Lipid Panel Lab Results  Component Value Date/Time   CHOL 106 04/12/2016 02:23 PM   TRIG 109.0 04/12/2016 02:23 PM   HDL 27.50 (L) 04/12/2016 02:23  PM   [place] 57 04/12/2016 02:23  PM     Risk Assessment/Calculations:    CHA2DS2-VASc Score = 7   This indicates a 11.2% annual risk of stroke. The patient's score is based upon: CHF History: 1 HTN History: 1 Diabetes History: 1 Stroke History: 2 Vascular Disease History: 1 Age Score: 1 Gender Score: 0         Physical Exam:    VS:  BP (!) 142/60   Pulse 74   Ht '5\' 10"'  (1.778 m)   Wt 235 lb 3.2 oz (106.7 kg)   SpO2 95%   BMI 33.75 kg/m     Wt Readings from Last 3 Encounters:  06/12/21 235 lb 3.2 oz (106.7 kg)  05/18/21 237 lb 14 oz (107.9 kg)  03/08/21 237 lb (107.5 kg)    Constitutional:      Appearance: Healthy appearance. Not in distress.  Neck:     Vascular: No JVR. JVD normal.  Pulmonary:     Effort: Pulmonary effort is normal.     Breath sounds: No wheezing. No rales.  Cardiovascular:     Normal rate. Irregularly irregular rhythm. Normal S1. Normal S2.      Murmurs: There is no murmur.  Edema:    Peripheral edema present.    Pretibial: bilateral 1+ edema of the pretibial area. Abdominal:     Palpations: Abdomen is soft.  Skin:    General: Skin is warm and dry.  Neurological:     General: No focal deficit present.     Mental Status: Alert and oriented to person, place and time.     Cranial Nerves: Cranial nerves are intact.       Medication Adjustments/Labs and Tests Ordered: Current medicines are reviewed at length with the patient today.  Concerns regarding medicines are outlined above.  Tests Ordered: Orders Placed This Encounter  Procedures  . Basic metabolic panel  . Basic metabolic panel   Medication Changes: Meds ordered this encounter  Medications  . spironolactone (ALDACTONE) 25 MG tablet    Sig: Take 0.5 tablets (12.5 mg total) by mouth daily.    Dispense:  45 tablet    Refill:  3   Signed, Richardson Dopp, PA-C  06/12/2021 5:00 PM    [address], [place], [place]  [address] Phone: [phone]; Fax: [phone]

## 2021-06-12 ENCOUNTER — Ambulatory Visit (INDEPENDENT_AMBULATORY_CARE_PROVIDER_SITE_OTHER): Payer: Medicare Other | Admitting: Physician Assistant

## 2021-06-12 ENCOUNTER — Other Ambulatory Visit: Payer: Self-pay

## 2021-06-12 ENCOUNTER — Encounter: Payer: Self-pay | Admitting: Physician Assistant

## 2021-06-12 ENCOUNTER — Telehealth: Payer: Self-pay | Admitting: *Deleted

## 2021-06-12 VITALS — BP 142/60 | HR 74 | Ht 70.0 in | Wt 235.2 lb

## 2021-06-12 DIAGNOSIS — I10 Essential (primary) hypertension: Secondary | ICD-10-CM

## 2021-06-12 DIAGNOSIS — D649 Anemia, unspecified: Secondary | ICD-10-CM

## 2021-06-12 DIAGNOSIS — I350 Nonrheumatic aortic (valve) stenosis: Secondary | ICD-10-CM

## 2021-06-12 DIAGNOSIS — E782 Mixed hyperlipidemia: Secondary | ICD-10-CM

## 2021-06-12 DIAGNOSIS — I2510 Atherosclerotic heart disease of native coronary artery without angina pectoris: Secondary | ICD-10-CM

## 2021-06-12 DIAGNOSIS — I4819 Other persistent atrial fibrillation: Secondary | ICD-10-CM

## 2021-06-12 DIAGNOSIS — W19XXXA Unspecified fall, initial encounter: Secondary | ICD-10-CM | POA: Insufficient documentation

## 2021-06-12 DIAGNOSIS — I5032 Chronic diastolic (congestive) heart failure: Secondary | ICD-10-CM

## 2021-06-12 DIAGNOSIS — I6523 Occlusion and stenosis of bilateral carotid arteries: Secondary | ICD-10-CM

## 2021-06-12 MED ORDER — SPIRONOLACTONE 25 MG PO TABS: 12.5000 mg | ORAL_TABLET | Freq: Every day | ORAL | 3 refills | Status: AC

## 2021-06-12 NOTE — Telephone Encounter (Signed)
S/w [facility] @ [phone] # 9 med rec ( eligibility. Will fax pt's recent labs to office.

## 2021-06-12 NOTE — Assessment & Plan Note (Signed)
As noted, he fell in our parking lot stepping on the curb.  He lost his balance.  He does not have any significant finding on chest exam.  There is no deformity or significant tenderness to palpation.  He does have an abrasion on his left face.  I offered to send him for a rib x-ray but he declines at this time.  Of asked him to contact us if his symptoms should change over the next couple of days.

## 2021-06-12 NOTE — Assessment & Plan Note (Signed)
Blood pressures at home are usually better controlled.  Continue metoprolol succinate 100 mg daily, Entresto 49/51 mg twice daily.  Start spironolactone as noted.

## 2021-06-12 NOTE — Assessment & Plan Note (Signed)
Status post bioprosthetic AVR.  His echocardiogram in May demonstrated a normally functioning AVR.  Continue SBE prophylaxis.

## 2021-06-12 NOTE — Assessment & Plan Note (Signed)
As noted, he has been followed at the [facility] for the past couple of years for anemia.  He notes his recent hemoglobin was in the 8 range.  He has had a recent endoscopy and colonoscopy.  He is also getting set up for iron infusions.

## 2021-06-12 NOTE — Assessment & Plan Note (Signed)
Managed by [facility].  His carotid US in 3/22 demonstrated 40-59% right ICA stenosis.

## 2021-06-12 NOTE — Assessment & Plan Note (Signed)
He has a history of chronic anemia.  He has been evaluated by hematology at the [facility].  He has had a recent colonoscopy and endoscopy.  It sounds like he is going to be set up for iron infusions soon.  Currently, he is able to remain on anticoagulation.  Continue apixaban 5 mg twice daily.

## 2021-06-12 NOTE — Assessment & Plan Note (Signed)
Status post CABG in March 2022.  He is doing well without anginal symptoms.  Continue aspirin 81 mg daily, atorvastatin 40 mg daily.

## 2021-06-12 NOTE — Assessment & Plan Note (Signed)
Continue atorvastatin 40 mg daily.  Obtain recent lipid panel from his doctor at the [place].

## 2021-06-12 NOTE — Assessment & Plan Note (Signed)
Echocardiogram in 5/22 with EF 55%.  NYHA IIb.  Volume status currently stable.  He does note a recent 8 pound weight gain since his torsemide was cut back to 20 mg daily.  He plans to take an extra dose of torsemide over the next 2 to 3 days.  Continue empagliflozin 10 mg daily, metoprolol succinate 100 mg daily, Entresto 49/51 mg twice daily.  Start spironolactone 12.5 mg daily.  Obtain BMET once weekly x2.

## 2021-06-12 NOTE — Patient Instructions (Signed)
Medication Instructions:  Your physician has recommended you make the following change in your medication:   START Spironolactone 25 taking 1/2 tablet daily   *If you need a refill on your cardiac medications before your next appointment, please call your pharmacy*   Lab Work: None ordered  If you have labs (blood work) drawn today and your tests are completely normal, you will receive your results only by: MyChart Message (if you have MyChart) OR A paper copy in the mail If you have any lab test that is abnormal or we need to change your treatment, we will call you to review the results.   Testing/Procedures: None ordered   Follow-Up: At [facility], you and your health needs are our priority.  As part of our continuing mission to provide you with exceptional heart care, we have created designated Provider Care Teams.  These Care Teams include your primary Cardiologist (physician) and Advanced Practice Providers (APPs -  Physician Assistants and Nurse Practitioners) who all work together to provide you with the care you need, when you need it.  We recommend signing up for the patient portal called "MyChart".  Sign up information is provided on this After Visit Summary.  MyChart is used to connect with patients for Virtual Visits (Telemedicine).  Patients are able to view lab/test results, encounter notes, upcoming appointments, etc.  Non-urgent messages can be sent to your provider as well.   To learn more about what you can do with MyChart, go to [email or web address].    Your next appointment:   3 month(s)  The format for your next appointment:   In Person  Provider:   [name], MD      Other Instructions

## 2021-06-19 NOTE — Telephone Encounter (Signed)
No answer could not get anyone on phone.

## 2021-06-21 ENCOUNTER — Other Ambulatory Visit: Payer: Medicare Other

## 2021-06-25 ENCOUNTER — Other Ambulatory Visit: Payer: Medicare Other

## 2021-06-25 ENCOUNTER — Other Ambulatory Visit: Payer: Self-pay

## 2021-06-25 DIAGNOSIS — I350 Nonrheumatic aortic (valve) stenosis: Secondary | ICD-10-CM

## 2021-06-25 DIAGNOSIS — I6523 Occlusion and stenosis of bilateral carotid arteries: Secondary | ICD-10-CM

## 2021-06-25 DIAGNOSIS — I4819 Other persistent atrial fibrillation: Secondary | ICD-10-CM

## 2021-06-25 DIAGNOSIS — E782 Mixed hyperlipidemia: Secondary | ICD-10-CM

## 2021-06-25 DIAGNOSIS — I2510 Atherosclerotic heart disease of native coronary artery without angina pectoris: Secondary | ICD-10-CM

## 2021-06-25 DIAGNOSIS — I5032 Chronic diastolic (congestive) heart failure: Secondary | ICD-10-CM

## 2021-06-25 DIAGNOSIS — I10 Essential (primary) hypertension: Secondary | ICD-10-CM

## 2021-06-25 LAB — BASIC METABOLIC PANEL
BUN/Creatinine Ratio: 19 (ref 10–24)
BUN: 15 mg/dL (ref 8–27)
CO2: 25 mmol/L (ref 20–29)
Calcium: 9.4 mg/dL (ref 8.6–10.2)
Chloride: 100 mmol/L (ref 96–106)
Creatinine, Ser: 0.78 mg/dL (ref 0.76–1.27)
Glucose: 190 mg/dL — ABNORMAL HIGH (ref 70–99)
Potassium: 4.5 mmol/L (ref 3.5–5.2)
Sodium: 140 mmol/L (ref 134–144)
eGFR: 93 mL/min/{1.73_m2} (ref >59)

## 2021-06-28 ENCOUNTER — Other Ambulatory Visit: Payer: Medicare Other

## 2021-08-31 ENCOUNTER — Other Ambulatory Visit: Payer: Self-pay | Admitting: Cardiology

## 2021-08-31 DIAGNOSIS — I25119 Atherosclerotic heart disease of native coronary artery with unspecified angina pectoris: Secondary | ICD-10-CM

## 2021-10-01 ENCOUNTER — Ambulatory Visit: Payer: Medicare Other | Admitting: Cardiology

## 2022-01-02 ENCOUNTER — Encounter: Payer: Self-pay | Admitting: Vascular Surgery

## 2022-01-02 ENCOUNTER — Ambulatory Visit (INDEPENDENT_AMBULATORY_CARE_PROVIDER_SITE_OTHER): Payer: No Typology Code available for payment source | Admitting: Vascular Surgery

## 2022-01-02 ENCOUNTER — Other Ambulatory Visit: Payer: Self-pay

## 2022-01-02 VITALS — BP 136/74 | HR 68 | Temp 98.0°F | Resp 20 | Ht 70.0 in | Wt 234.9 lb

## 2022-01-02 DIAGNOSIS — I6523 Occlusion and stenosis of bilateral carotid arteries: Secondary | ICD-10-CM

## 2022-01-02 NOTE — Progress Notes (Signed)
Patient ID: Mason Patton, male   DOB: October 14, 1945, 76 y.o.   MRN: [id number]  Reason for Consult: New Patient (Initial Visit) and Carotid   Referred by Heywood Bene, *  Subjective:     HPI:  Mason Patton is a 76 y.o. male had a history of CABG 1 year ago.  He is also in atrial fibrillation for which he takes anticoagulation.  He is also on aspirin daily.  He thinks he had a mini stroke at the time he had meningitis several years ago.  More recently he has had some dizzy episodes with some visual disturbances when he is driving which is preventing him from driving more recently.  He also had some numbness of the fingers of his right hand but denies any weakness or sensation changes of the arm itself or lower extremities.  States that he has never had any type of stroke.  He walks slowly but without any limitation denies claudication.  An ultrasound was performed at the [place] and he is sent here for evaluation of possible 80% symptomatic carotid stenosis.  Past Medical History:  Diagnosis Date   Aortic stenosis    09/30/19 echo ([facility], Cardiologist Dr. Geraldo Pitter): Mild-moderate AS, MaxVel 308 cm/s, MeanPG 22 mmHg, AVA 1.6 cm2   Arthritis    Atrial fibrillation (HCC)    Carotid artery stenosis    11/03/19 [place] ([facility]): 50-69% RICA stenosis   Cholecystitis with cholelithiasis    Eczema    Emphysema of lung (HCC)    Gallstones and inflammation of gallbladder without obstruction    Heart murmur    History of nuclear stress test 02/23/2010   dipyridamole; normal pattern of perfusion in all regions; normal, low risk study    Hyperlipidemia    Hypertension    Hypothyroidism    "had thyroid killed w/radiation" (05/19/2013)   Meningoencephalitis    Obesity    OSA on CPAP    last sleep study >8 yrs   Pneumonia    "twice" (05/19/2013)   Shortness of breath    Stroke [facility])    "they said I had a minor one when I had menigitis" (05/19/2013)   Thyroid disease     Tobacco abuse    quit 11/08/2012   Type II diabetes mellitus ([place])    Umbilical hernia    Family History  Problem Relation Age of Onset   Cancer Maternal Aunt        colon   Cancer Maternal Aunt        lung   Thyroid disease Mother    Hyperlipidemia Father    Diabetes Maternal Grandmother    Asthma Child    Diabetes type I Child    Past Surgical History:  Procedure Laterality Date   AORTIC VALVE REPLACEMENT N/A 09/11/2020   Procedure: AORTIC VALVE REPLACEMENT (AVR);  Surgeon: Gaye Pollack, MD;  Location: [place];  Service: Open Heart Surgery;  Laterality: N/A;   CHOLECYSTECTOMY  09/09/2011   Procedure: LAPAROSCOPIC CHOLECYSTECTOMY WITH INTRAOPERATIVE CHOLANGIOGRAM;  Surgeon: Belva Crome, MD;  Location: [place];  Service: General;  Laterality: N/A;   CLIPPING OF ATRIAL APPENDAGE N/A 09/11/2020   Procedure: CLIPPING OF ATRIAL APPENDAGE;  Surgeon: Gaye Pollack, MD;  Location: [place];  Service: Open Heart Surgery;  Laterality: N/A;   CORONARY ARTERY BYPASS GRAFT N/A 09/11/2020   Procedure: CORONARY ARTERY BYPASS GRAFTING (CABG), ON PUMP, TIMES 2 , USING LEFT INTERNAL MAMMARY ARTERY AND ENDOSCOPICALLY HARVESTED RIGHT  GREATER SAPHENOUS VEIN;  Surgeon: Gaye Pollack, MD;  [facility];  Service: Open Heart Surgery;  Laterality: N/A;   HERNIA REPAIR  04/2006   UHR   IR THORACENTESIS ASP PLEURAL SPACE W/IMG GUIDE  10/25/2020   JOINT REPLACEMENT     KNEE ARTHROSCOPY Right    "w2 times" (05/19/2013)   RIGHT/LEFT HEART CATH AND CORONARY ANGIOGRAPHY N/A 09/07/2020   Procedure: RIGHT/LEFT HEART CATH AND CORONARY ANGIOGRAPHY;  Surgeon: Lorretta Harp, MD;  Location: [place] CV LAB;  Service: Cardiovascular;  Laterality: N/A;   TEE WITHOUT CARDIOVERSION N/A 09/11/2020   Procedure: TRANSESOPHAGEAL ECHOCARDIOGRAM (TEE);  Surgeon: Gaye Pollack, MD;  Location: [place];  Service: Open Heart Surgery;  Laterality: N/A;   TEE WITHOUT CARDIOVERSION N/A 11/16/2020   Procedure: TRANSESOPHAGEAL  ECHOCARDIOGRAM (TEE);  Surgeon: Sueanne Margarita, MD;  Location: [facility] ENDOSCOPY;  Service: Cardiovascular;  Laterality: N/A;   TONSILLECTOMY     TOTAL KNEE ARTHROPLASTY Left 05/19/2013   TOTAL KNEE ARTHROPLASTY Left 05/19/2013   Procedure: TOTAL KNEE ARTHROPLASTY;  Surgeon: Newt Minion, MD;  Location: [place];  Service: Orthopedics;  Laterality: Left;  Left Total Knee Arthroplasty   TOTAL KNEE ARTHROPLASTY Right 11/24/2019   TOTAL KNEE ARTHROPLASTY Right 11/24/2019   Procedure: RIGHT TOTAL KNEE ARTHROPLASTY;  Surgeon: Newt Minion, MD;  Location: [place];  Service: Orthopedics;  Laterality: Right;   TRANSTHORACIC ECHOCARDIOGRAM  03/28/2005   EF=>55%, mod conc LVH; LA mod dilated & borderline RA enlargement; mild TR; mild pulm valve regurg    Short Social History:  Social History   Tobacco Use   Smoking status: Every Day    Packs/day: 0.50    Years: 50.00    Total pack years: 25.00    Types: Cigarettes    Passive exposure: Never   Smokeless tobacco: Never  Substance Use Topics   Alcohol use: No    Allergies  Allergen Reactions   Codeine Nausea And Vomiting   Demerol Nausea And Vomiting    Current Outpatient Medications  Medication Sig Dispense Refill   acetaminophen (TYLENOL) 325 MG tablet Take 2 tablets (650 mg total) by mouth every 4 (four) hours as needed for headache or mild pain.     apixaban (ELIQUIS) 5 MG TABS tablet Take 5 mg by mouth 2 (two) times daily.     aspirin EC 81 MG EC tablet Take 1 tablet (81 mg total) by mouth daily. Swallow whole. 30 tablet 11   atorvastatin (LIPITOR) 40 MG tablet Take 40 mg by mouth daily in the afternoon.     benzonatate (TESSALON) 100 MG capsule Take 100 mg by mouth 3 (three) times daily as needed for cough.     Blood Glucose Monitoring Suppl (ONETOUCH VERIO FLEX SYSTEM) W/DEVICE KIT 1 each by Does not apply route daily. Dx: E11.9 1 kit 0   cholecalciferol (VITAMIN D3) 25 MCG (1000 UNIT) tablet Take 1,000 Units by mouth daily.      Cyanocobalamin (VITAMIN B12) 500 MCG TABS Take 500 mcg by mouth 2 (two) times daily.     empagliflozin (JARDIANCE) 10 MG TABS tablet Take 1 tablet (10 mg total) by mouth daily before breakfast. 90 tablet 3   ferrous sulfate 325 (65 FE) MG tablet Take 325 mg by mouth every Monday, Wednesday, and Friday.     glipiZIDE (GLUCOTROL) 10 MG tablet Take 10 mg by mouth 2 (two) times daily before a meal.     glucose blood (ONETOUCH VERIO) test strip Use  to test blood sugar 2-3 times daily as instructed. Dx: E11.9 200 each 3   HYDROcodone-acetaminophen (NORCO/VICODIN) 5-325 MG tablet Take 1 tablet by mouth every 6 (six) hours as needed for moderate pain.     metFORMIN (GLUCOPHAGE) 500 MG tablet Take 1,000 mg by mouth 2 (two) times daily with a meal.     metoprolol succinate (TOPROL-XL) 100 MG 24 hr tablet Take 1 tablet (100 mg total) by mouth daily. Take with or immediately following a meal. 30 tablet 11   Multiple Vitamins-Minerals (CENTRUM SILVER 50+MEN) TABS Take 1 tablet by mouth daily with breakfast.     Multiple Vitamins-Minerals (PRESERVISION AREDS PO) Take 1 capsule by mouth 2 (two) times daily.     nitroGLYCERIN (NITROSTAT) 0.4 MG SL tablet Place 0.4 mg under the tongue every 5 (five) minutes as needed for chest pain.     nortriptyline (PAMELOR) 10 MG capsule Take 10 mg by mouth See admin instructions. Take 10 mg by mouth 30-45 minutes prior to bedtime for overactive intestinal muscles     omeprazole (PRILOSEC) 20 MG capsule Take 20 mg by mouth daily before breakfast.     ONETOUCH DELICA LANCETS FINE MISC Use to test blood sugar 2-3 times daily as instructed. Dx: E11.9 200 each 3   Pseudoephedrine-guaiFENesin (MUCINEX D PO) Take 1 tablet by mouth every 12 (twelve) hours as needed (for coughing).     sacubitril-valsartan (ENTRESTO) 49-51 MG Take 1 tablet by mouth 2 (two) times daily. 60 tablet 5   torsemide (DEMADEX) 20 MG tablet Take 20 mg by mouth See admin instructions. Take 20 mg by mouth in the  morning and may take an additional 20 mg once a day as directed for a period of 3 days, for an overnight weight gain of 5 pounds 72 tablet 0   VENTOLIN HFA 108 (90 Base) MCG/ACT inhaler Inhale 1-2 puffs into the lungs every 4 (four) hours as needed for wheezing or shortness of breath.      levothyroxine (SYNTHROID) 200 MCG tablet Take 1 tablet (200 mcg total) by mouth daily. 30 tablet 0   spironolactone (ALDACTONE) 25 MG tablet Take 0.5 tablets (12.5 mg total) by mouth daily. 45 tablet 3   No current facility-administered medications for this visit.    Review of Systems  Constitutional:  Constitutional negative. HENT: HENT negative.  Eyes: Positive for visual disturbance.   Respiratory: Respiratory negative.  Cardiovascular: Cardiovascular negative.  GI: Gastrointestinal negative.  Musculoskeletal: Musculoskeletal negative.  Skin: Skin negative.  Neurological: Positive for numbness.  Hematologic: Positive for bruises/bleeds easily.  Psychiatric: Psychiatric negative.        Objective:  Objective   Vitals:   01/02/22 0902 01/02/22 0903  BP: 130/85 136/74  Pulse: 68   Resp: 20   Temp: 98 F (36.7 C)   TempSrc: Temporal   SpO2: 95%   Weight: 234 lb 14.4 oz (106.5 kg)   Height: '5\' 10"'  (1.778 m)    Body mass index is 33.7 kg/m.  Physical Exam HENT:     Head: Normocephalic.     Nose: Nose normal.  Eyes:     Pupils: Pupils are equal, round, and reactive to light.  Neck:     Vascular: No carotid bruit.  Cardiovascular:     Rate and Rhythm: Normal rate.  Pulmonary:     Effort: Pulmonary effort is normal.     Breath sounds: Normal breath sounds.  Abdominal:     General: Abdomen is flat.  Palpations: Abdomen is soft. There is no mass.  Musculoskeletal:        General: Normal range of motion.     Right lower leg: No edema.     Left lower leg: No edema.  Skin:    General: Skin is warm.     Capillary Refill: Capillary refill takes less than 2 seconds.   Neurological:     General: No focal deficit present.     Mental Status: He is alert and oriented to person, place, and time.  Psychiatric:        Mood and Affect: Mood normal.        Behavior: Behavior normal.     Data:      Assessment/Plan:    76 year old male sent here for right carotid artery stenosis which is 80% by the referral sheet but I do not appreciate that by velocities of his recent ultrasound performed at the [place] of which I have reviewed the images.  I discussed with the patient we can continue with medical therapy only or can consider CT scan.  This time patient wants to pursue CT scan given that he is having some neurologic issues.  He is set to see neurology in July.  We will get a CT scan I will see him back afterwards to review the images and discuss possible intervention.  We discussed the signs and symptoms of stroke and he demonstrates good understanding that he would require emergent evaluation.     Waynetta Sandy MD [facility]

## 2022-01-18 ENCOUNTER — Ambulatory Visit
Admission: RE | Admit: 2022-01-18 | Discharge: 2022-01-18 | Disposition: A | Discharge status: Home / Self Care | Payer: Self-pay | Source: Ambulatory Visit | Attending: Vascular Surgery | Admitting: Vascular Surgery

## 2022-01-18 DIAGNOSIS — I6523 Occlusion and stenosis of bilateral carotid arteries: Secondary | ICD-10-CM

## 2022-01-18 MED ORDER — IOPAMIDOL (ISOVUE-370) INJECTION 76%
75.0000 mL | Freq: Once | INTRAVENOUS | Status: AC | PRN
  Administered 2022-01-18: 75 mL via INTRAVENOUS

## 2022-01-23 ENCOUNTER — Encounter: Payer: Self-pay | Admitting: Vascular Surgery

## 2022-01-23 ENCOUNTER — Ambulatory Visit (INDEPENDENT_AMBULATORY_CARE_PROVIDER_SITE_OTHER): Payer: No Typology Code available for payment source | Admitting: Vascular Surgery

## 2022-01-23 VITALS — BP 134/80 | HR 83 | Temp 97.9°F | Resp 20 | Ht 70.0 in | Wt 231.3 lb

## 2022-01-23 DIAGNOSIS — I6523 Occlusion and stenosis of bilateral carotid arteries: Secondary | ICD-10-CM

## 2022-01-23 NOTE — Progress Notes (Signed)
Patient ID: Mason Patton, male   DOB: 1945/10/23, 76 y.o.   MRN: [id number]  Reason for Consult: Follow-up   Referred by Heywood Bene, *  Subjective:     HPI:  Mason Patton is a 76 y.o. male initially evaluated for 80% carotid stenosis on the right which was really asymptomatic but he was having some visual changes and some dizziness issues.  The ultrasound that I was given did not correlate based on velocities and so CT scan was obtained.  He has not had any further neurologic issues.  He remains on Eliquis for atrial fibrillation and also takes aspirin daily.  Past Medical History:  Diagnosis Date   Aortic stenosis    09/30/19 echo ([facility], Cardiologist Dr. Geraldo Pitter): Mild-moderate AS, MaxVel 308 cm/s, MeanPG 22 mmHg, AVA 1.6 cm2   Arthritis    Atrial fibrillation (HCC)    Carotid artery stenosis    11/03/19 [place] ([facility]): 50-69% RICA stenosis   Cholecystitis with cholelithiasis    Eczema    Emphysema of lung (HCC)    Gallstones and inflammation of gallbladder without obstruction    Heart murmur    History of nuclear stress test 02/23/2010   dipyridamole; normal pattern of perfusion in all regions; normal, low risk study    Hyperlipidemia    Hypertension    Hypothyroidism    "had thyroid killed w/radiation" (05/19/2013)   Meningoencephalitis    Obesity    OSA on CPAP    last sleep study >8 yrs   Pneumonia    "twice" (05/19/2013)   Shortness of breath    Stroke [facility])    "they said I had a minor one when I had menigitis" (05/19/2013)   Thyroid disease    Tobacco abuse    quit 11/08/2012   Type II diabetes mellitus ([place])    Umbilical hernia    Family History  Problem Relation Age of Onset   Cancer Maternal Aunt        colon   Cancer Maternal Aunt        lung   Thyroid disease Mother    Hyperlipidemia Father    Diabetes Maternal Grandmother    Asthma Child    Diabetes type I Child    Past Surgical History:  Procedure Laterality  Date   AORTIC VALVE REPLACEMENT N/A 09/11/2020   Procedure: AORTIC VALVE REPLACEMENT (AVR);  Surgeon: Gaye Pollack, MD;  Location: [place];  Service: Open Heart Surgery;  Laterality: N/A;   CHOLECYSTECTOMY  09/09/2011   Procedure: LAPAROSCOPIC CHOLECYSTECTOMY WITH INTRAOPERATIVE CHOLANGIOGRAM;  Surgeon: Belva Crome, MD;  Location: [place];  Service: General;  Laterality: N/A;   CLIPPING OF ATRIAL APPENDAGE N/A 09/11/2020   Procedure: CLIPPING OF ATRIAL APPENDAGE;  Surgeon: Gaye Pollack, MD;  Location: [place];  Service: Open Heart Surgery;  Laterality: N/A;   CORONARY ARTERY BYPASS GRAFT N/A 09/11/2020   Procedure: CORONARY ARTERY BYPASS GRAFTING (CABG), ON PUMP, TIMES 2 , USING LEFT INTERNAL MAMMARY ARTERY AND ENDOSCOPICALLY HARVESTED RIGHT GREATER SAPHENOUS VEIN;  Surgeon: Gaye Pollack, MD;  Location: [place];  Service: Open Heart Surgery;  Laterality: N/A;   HERNIA REPAIR  04/2006   UHR   IR THORACENTESIS ASP PLEURAL SPACE W/IMG GUIDE  10/25/2020   JOINT REPLACEMENT     KNEE ARTHROSCOPY Right    "w2 times" (05/19/2013)   RIGHT/LEFT HEART CATH AND CORONARY ANGIOGRAPHY N/A 09/07/2020   Procedure: RIGHT/LEFT HEART CATH AND CORONARY ANGIOGRAPHY;  Surgeon: Lorretta Harp, MD;  Location: [place] CV LAB;  Service: Cardiovascular;  Laterality: N/A;   TEE WITHOUT CARDIOVERSION N/A 09/11/2020   Procedure: TRANSESOPHAGEAL ECHOCARDIOGRAM (TEE);  Surgeon: Gaye Pollack, MD;  Location: [place];  Service: Open Heart Surgery;  Laterality: N/A;   TEE WITHOUT CARDIOVERSION N/A 11/16/2020   Procedure: TRANSESOPHAGEAL ECHOCARDIOGRAM (TEE);  Surgeon: Sueanne Margarita, MD;  Location: [facility] ENDOSCOPY;  Service: Cardiovascular;  Laterality: N/A;   TONSILLECTOMY     TOTAL KNEE ARTHROPLASTY Left 05/19/2013   TOTAL KNEE ARTHROPLASTY Left 05/19/2013   Procedure: TOTAL KNEE ARTHROPLASTY;  Surgeon: Newt Minion, MD;  Location: [place];  Service: Orthopedics;  Laterality: Left;  Left Total Knee Arthroplasty   TOTAL KNEE  ARTHROPLASTY Right 11/24/2019   TOTAL KNEE ARTHROPLASTY Right 11/24/2019   Procedure: RIGHT TOTAL KNEE ARTHROPLASTY;  Surgeon: Newt Minion, MD;  Location: [place];  Service: Orthopedics;  Laterality: Right;   TRANSTHORACIC ECHOCARDIOGRAM  03/28/2005   EF=>55%, mod conc LVH; LA mod dilated & borderline RA enlargement; mild TR; mild pulm valve regurg    Short Social History:  Social History   Tobacco Use   Smoking status: Every Day    Packs/day: 0.50    Years: 50.00    Total pack years: 25.00    Types: Cigarettes    Passive exposure: Never   Smokeless tobacco: Never  Substance Use Topics   Alcohol use: No    Allergies  Allergen Reactions   Codeine Nausea And Vomiting   Demerol Nausea And Vomiting    Current Outpatient Medications  Medication Sig Dispense Refill   acetaminophen (TYLENOL) 325 MG tablet Take 2 tablets (650 mg total) by mouth every 4 (four) hours as needed for headache or mild pain.     apixaban (ELIQUIS) 5 MG TABS tablet Take 5 mg by mouth 2 (two) times daily.     aspirin EC 81 MG EC tablet Take 1 tablet (81 mg total) by mouth daily. Swallow whole. 30 tablet 11   atorvastatin (LIPITOR) 40 MG tablet Take 40 mg by mouth daily in the afternoon.     benzonatate (TESSALON) 100 MG capsule Take 100 mg by mouth 3 (three) times daily as needed for cough.     Blood Glucose Monitoring Suppl (ONETOUCH VERIO FLEX SYSTEM) W/DEVICE KIT 1 each by Does not apply route daily. Dx: E11.9 1 kit 0   cholecalciferol (VITAMIN D3) 25 MCG (1000 UNIT) tablet Take 1,000 Units by mouth daily.     Cyanocobalamin (VITAMIN B12) 500 MCG TABS Take 500 mcg by mouth 2 (two) times daily.     empagliflozin (JARDIANCE) 10 MG TABS tablet Take 1 tablet (10 mg total) by mouth daily before breakfast. 90 tablet 3   ferrous sulfate 325 (65 FE) MG tablet Take 325 mg by mouth every Monday, Wednesday, and Friday.     glipiZIDE (GLUCOTROL) 10 MG tablet Take 10 mg by mouth 2 (two) times daily before a meal.      glucose blood (ONETOUCH VERIO) test strip Use to test blood sugar 2-3 times daily as instructed. Dx: E11.9 200 each 3   HYDROcodone-acetaminophen (NORCO/VICODIN) 5-325 MG tablet Take 1 tablet by mouth every 6 (six) hours as needed for moderate pain.     metFORMIN (GLUCOPHAGE) 500 MG tablet Take 1,000 mg by mouth 2 (two) times daily with a meal.     metoprolol succinate (TOPROL-XL) 100 MG 24 hr tablet Take 1 tablet (100 mg total) by mouth daily. Take  with or immediately following a meal. 30 tablet 11   Multiple Vitamins-Minerals (CENTRUM SILVER 50+MEN) TABS Take 1 tablet by mouth daily with breakfast.     Multiple Vitamins-Minerals (PRESERVISION AREDS PO) Take 1 capsule by mouth 2 (two) times daily.     nitroGLYCERIN (NITROSTAT) 0.4 MG SL tablet Place 0.4 mg under the tongue every 5 (five) minutes as needed for chest pain.     nortriptyline (PAMELOR) 10 MG capsule Take 10 mg by mouth See admin instructions. Take 10 mg by mouth 30-45 minutes prior to bedtime for overactive intestinal muscles     omeprazole (PRILOSEC) 20 MG capsule Take 20 mg by mouth daily before breakfast.     ONETOUCH DELICA LANCETS FINE MISC Use to test blood sugar 2-3 times daily as instructed. Dx: E11.9 200 each 3   Pseudoephedrine-guaiFENesin (MUCINEX D PO) Take 1 tablet by mouth every 12 (twelve) hours as needed (for coughing).     sacubitril-valsartan (ENTRESTO) 49-51 MG Take 1 tablet by mouth 2 (two) times daily. 60 tablet 5   torsemide (DEMADEX) 20 MG tablet Take 20 mg by mouth See admin instructions. Take 20 mg by mouth in the morning and may take an additional 20 mg once a day as directed for a period of 3 days, for an overnight weight gain of 5 pounds 72 tablet 0   VENTOLIN HFA 108 (90 Base) MCG/ACT inhaler Inhale 1-2 puffs into the lungs every 4 (four) hours as needed for wheezing or shortness of breath.      levothyroxine (SYNTHROID) 200 MCG tablet Take 1 tablet (200 mcg total) by mouth daily. 30 tablet 0   spironolactone  (ALDACTONE) 25 MG tablet Take 0.5 tablets (12.5 mg total) by mouth daily. 45 tablet 3   No current facility-administered medications for this visit.    Review of Systems  Constitutional:  Constitutional negative. HENT: HENT negative.  Eyes: Positive for visual disturbance.   Respiratory: Respiratory negative.  Cardiovascular: Cardiovascular negative.  GI: Gastrointestinal negative.  Musculoskeletal: Musculoskeletal negative.  Skin: Skin negative.  Neurological: Neurological negative. Hematologic: Hematologic/lymphatic negative.  Psychiatric: Psychiatric negative.        Objective:  Objective   Vitals:   01/23/22 1420 01/23/22 1423  BP: 135/83 134/80  Pulse: 83   Resp: 20   Temp: 97.9 F (36.6 C)   SpO2: 94%   Weight: 231 lb 4.8 oz (104.9 kg)   Height: '5\' 10"'  (1.778 m)    Body mass index is 33.19 kg/m.  Physical Exam HENT:     Head: Normocephalic.     Nose: Nose normal.  Eyes:     Pupils: Pupils are equal, round, and reactive to light.  Neck:     Vascular: No carotid bruit.  Cardiovascular:     Pulses: Normal pulses.  Abdominal:     General: Abdomen is flat.     Palpations: Abdomen is soft.  Musculoskeletal:        General: Normal range of motion.     Right lower leg: No edema.     Left lower leg: No edema.  Skin:    General: Skin is warm and dry.     Capillary Refill: Capillary refill takes less than 2 seconds.  Neurological:     General: No focal deficit present.     Mental Status: He is alert.  Psychiatric:        Mood and Affect: Mood normal.        Behavior: Behavior normal.  Thought Content: Thought content normal.        Judgment: Judgment normal.     Data: CT IMPRESSION: No acute intracranial abnormality. Chronic microvascular ischemic changes with a few chronic small vessel infarcts.   Primarily calcified plaque at the right common carotid bifurcation and proximal internal carotid with up to 70% stenosis.   Plaque at the left  common carotid bifurcation and proximal internal carotid with less than 50% stenosis.     Assessment/Plan:    76 year old male with high-grade right ICA asymptomatic stenosis.  We reviewed his CT scan today.  He is really on the borderline for requiring treatment but given that duplex at the [place] was over 80% and now he has CT scan 11% he certainly merits intervention.  I discussed with him that he is not a candidate for stent given the circumferential calcification and would require endarterectomy.  He has a history of CABG and does follow with [facility]ology at the [facility]  We will obtain cardiac clearance and plan for right carotid endarterectomy in the near future.  Eliquis will need to be held for 48 hours prior to procedure.     Waynetta Sandy MD [facility]

## 2022-01-28 ENCOUNTER — Telehealth: Payer: Self-pay | Admitting: Cardiology

## 2022-01-28 NOTE — Telephone Encounter (Signed)
   Pre-operative Risk Assessment    Patient Name: Mason Patton  DOB: [birth year] MRN: [id number]      Request for Surgical Clearance    Procedure:   Right Carotid Endarterectomy  Date of Surgery:  Clearance TBD                                 Surgeon:  Dr. Lemar Livings Surgeon's Group or Practice Name:  [facility] Phone number:  [phone] Fax number:  [phone]   Type of Clearance Requested:   - Medical  - Pharmacy apixaban (ELIQUIS) 5 MG TABS tablet to be held 48 before   Type of Anesthesia:  General    Additional requests/questions:  Please fax a copy of medical clearance to the surgeon's office.  [name]   01/28/2022, 1:42 PM

## 2022-01-28 NOTE — Telephone Encounter (Signed)
[facility] is calling back to cancel this clearance due to the [place] not authorizing it to go through.

## 2022-01-28 NOTE — Telephone Encounter (Signed)
I update the pre op provider that procedure has been cancelled per the requesting office.

## 2022-01-31 ENCOUNTER — Encounter (HOSPITAL_COMMUNITY): Payer: Self-pay

## 2022-01-31 ENCOUNTER — Other Ambulatory Visit: Payer: Self-pay

## 2022-01-31 ENCOUNTER — Emergency Department (HOSPITAL_COMMUNITY): Payer: No Typology Code available for payment source

## 2022-01-31 ENCOUNTER — Inpatient Hospital Stay (HOSPITAL_COMMUNITY)
Admission: EM | Admit: 2022-01-31 | Discharge: 2022-02-03 | DRG: 292 | Disposition: A | Discharge status: Home Health | Payer: No Typology Code available for payment source | Attending: Student in an Organized Health Care Education/Training Program | Admitting: Student in an Organized Health Care Education/Training Program

## 2022-01-31 DIAGNOSIS — Z79899 Other long term (current) drug therapy: Secondary | ICD-10-CM

## 2022-01-31 DIAGNOSIS — Z6833 Body mass index (BMI) 33.0-33.9, adult: Secondary | ICD-10-CM

## 2022-01-31 DIAGNOSIS — K219 Gastro-esophageal reflux disease without esophagitis: Secondary | ICD-10-CM | POA: Diagnosis present

## 2022-01-31 DIAGNOSIS — Z20822 Contact with and (suspected) exposure to covid-19: Secondary | ICD-10-CM | POA: Diagnosis present

## 2022-01-31 DIAGNOSIS — I4819 Other persistent atrial fibrillation: Secondary | ICD-10-CM | POA: Diagnosis present

## 2022-01-31 DIAGNOSIS — Z79890 Hormone replacement therapy (postmenopausal): Secondary | ICD-10-CM

## 2022-01-31 DIAGNOSIS — R350 Frequency of micturition: Secondary | ICD-10-CM | POA: Diagnosis present

## 2022-01-31 DIAGNOSIS — R7881 Bacteremia: Secondary | ICD-10-CM | POA: Diagnosis present

## 2022-01-31 DIAGNOSIS — I5032 Chronic diastolic (congestive) heart failure: Secondary | ICD-10-CM | POA: Diagnosis present

## 2022-01-31 DIAGNOSIS — I110 Hypertensive heart disease with heart failure: Secondary | ICD-10-CM | POA: Diagnosis not present

## 2022-01-31 DIAGNOSIS — E039 Hypothyroidism, unspecified: Secondary | ICD-10-CM | POA: Diagnosis present

## 2022-01-31 DIAGNOSIS — R6510 Systemic inflammatory response syndrome (SIRS) of non-infectious origin without acute organ dysfunction: Secondary | ICD-10-CM | POA: Diagnosis not present

## 2022-01-31 DIAGNOSIS — R81 Glycosuria: Secondary | ICD-10-CM | POA: Diagnosis present

## 2022-01-31 DIAGNOSIS — Z833 Family history of diabetes mellitus: Secondary | ICD-10-CM

## 2022-01-31 DIAGNOSIS — E119 Type 2 diabetes mellitus without complications: Secondary | ICD-10-CM | POA: Diagnosis present

## 2022-01-31 DIAGNOSIS — Z952 Presence of prosthetic heart valve: Secondary | ICD-10-CM

## 2022-01-31 DIAGNOSIS — E669 Obesity, unspecified: Secondary | ICD-10-CM | POA: Diagnosis present

## 2022-01-31 DIAGNOSIS — E785 Hyperlipidemia, unspecified: Secondary | ICD-10-CM | POA: Diagnosis present

## 2022-01-31 DIAGNOSIS — Z7984 Long term (current) use of oral hypoglycemic drugs: Secondary | ICD-10-CM

## 2022-01-31 DIAGNOSIS — J439 Emphysema, unspecified: Secondary | ICD-10-CM | POA: Diagnosis present

## 2022-01-31 DIAGNOSIS — Z87891 Personal history of nicotine dependence: Secondary | ICD-10-CM

## 2022-01-31 DIAGNOSIS — Z87440 Personal history of urinary (tract) infections: Secondary | ICD-10-CM

## 2022-01-31 DIAGNOSIS — Z8673 Personal history of transient ischemic attack (TIA), and cerebral infarction without residual deficits: Secondary | ICD-10-CM

## 2022-01-31 DIAGNOSIS — Z7901 Long term (current) use of anticoagulants: Secondary | ICD-10-CM

## 2022-01-31 DIAGNOSIS — Z7982 Long term (current) use of aspirin: Secondary | ICD-10-CM

## 2022-01-31 DIAGNOSIS — G4733 Obstructive sleep apnea (adult) (pediatric): Secondary | ICD-10-CM

## 2022-01-31 DIAGNOSIS — R509 Fever, unspecified: Secondary | ICD-10-CM | POA: Diagnosis not present

## 2022-01-31 DIAGNOSIS — Z66 Do not resuscitate: Secondary | ICD-10-CM | POA: Diagnosis present

## 2022-01-31 DIAGNOSIS — Z8249 Family history of ischemic heart disease and other diseases of the circulatory system: Secondary | ICD-10-CM

## 2022-01-31 DIAGNOSIS — Z885 Allergy status to narcotic agent status: Secondary | ICD-10-CM

## 2022-01-31 DIAGNOSIS — Z951 Presence of aortocoronary bypass graft: Secondary | ICD-10-CM

## 2022-01-31 LAB — URINALYSIS, ROUTINE W REFLEX MICROSCOPIC
Bilirubin Urine: NEGATIVE
Glucose, UA: >=500 mg/dL — AB
Hgb urine dipstick: NEGATIVE
Ketones, ur: NEGATIVE mg/dL
Leukocytes,Ua: NEGATIVE
Nitrite: NEGATIVE
Protein, ur: 30 mg/dL — AB
Specific Gravity, Urine: 1.016 (ref 1.005–1.030)
pH: 6 (ref 5.0–8.0)

## 2022-01-31 LAB — CBC WITH DIFFERENTIAL/PLATELET
Abs Immature Granulocytes: 0.04 10*3/uL (ref 0.00–0.07)
Basophils Absolute: 0 10*3/uL (ref 0.0–0.1)
Basophils Relative: 0 %
Eosinophils Absolute: 0.1 10*3/uL (ref 0.0–0.5)
Eosinophils Relative: 1 %
HCT: 42.5 % (ref 39.0–52.0)
Hemoglobin: 14.2 g/dL (ref 13.0–17.0)
Immature Granulocytes: 0 %
Lymphocytes Relative: 11 %
Lymphs Abs: 1 10*3/uL (ref 0.7–4.0)
MCH: 31.6 pg (ref 26.0–34.0)
MCHC: 33.4 g/dL (ref 30.0–36.0)
MCV: 94.4 fL (ref 80.0–100.0)
Monocytes Absolute: 0.7 10*3/uL (ref 0.1–1.0)
Monocytes Relative: 7 %
Neutro Abs: 7.2 10*3/uL (ref 1.7–7.7)
Neutrophils Relative %: 81 %
Platelets: 146 10*3/uL — ABNORMAL LOW (ref 150–400)
RBC: 4.5 MIL/uL (ref 4.22–5.81)
RDW: 12.9 % (ref 11.5–15.5)
WBC: 9 10*3/uL (ref 4.0–10.5)
nRBC: 0 % (ref 0.0–0.2)

## 2022-01-31 LAB — LACTIC ACID, PLASMA: Lactic Acid, Venous: 1.6 mmol/L (ref 0.5–1.9)

## 2022-01-31 LAB — RESP PANEL BY RT-PCR (FLU A&B, COVID) ARPGX2
Influenza A by PCR: NEGATIVE
Influenza B by PCR: NEGATIVE
SARS Coronavirus 2 by RT PCR: NEGATIVE

## 2022-01-31 LAB — COMPREHENSIVE METABOLIC PANEL
ALT: 15 U/L (ref 0–44)
AST: 18 U/L (ref 15–41)
Albumin: 3.7 g/dL (ref 3.5–5.0)
Alkaline Phosphatase: 68 U/L (ref 38–126)
Anion gap: 9 (ref 5–15)
BUN: 16 mg/dL (ref 8–23)
CO2: 26 mmol/L (ref 22–32)
Calcium: 8.9 mg/dL (ref 8.9–10.3)
Chloride: 103 mmol/L (ref 98–111)
Creatinine, Ser: 1.03 mg/dL (ref 0.61–1.24)
GFR, Estimated: >60 mL/min (ref >60)
Glucose, Bld: 100 mg/dL — ABNORMAL HIGH (ref 70–99)
Potassium: 3.8 mmol/L (ref 3.5–5.1)
Sodium: 138 mmol/L (ref 135–145)
Total Bilirubin: 1.3 mg/dL — ABNORMAL HIGH (ref 0.3–1.2)
Total Protein: 6.6 g/dL (ref 6.5–8.1)

## 2022-01-31 LAB — PROTIME-INR
INR: 1.3 — ABNORMAL HIGH (ref 0.8–1.2)
Prothrombin Time: 16.5 seconds — ABNORMAL HIGH (ref 11.4–15.2)

## 2022-01-31 LAB — MRSA NEXT GEN BY PCR, NASAL: MRSA by PCR Next Gen: NOT DETECTED

## 2022-01-31 LAB — APTT: aPTT: 36 seconds (ref 24–36)

## 2022-01-31 MED ORDER — ONDANSETRON HCL 4 MG/2ML IJ SOLN: 4.0000 mg | Freq: Four times a day (QID) | INTRAMUSCULAR | Status: DC | PRN

## 2022-01-31 MED ORDER — ACETAMINOPHEN 650 MG RE SUPP: 650.0000 mg | Freq: Four times a day (QID) | RECTAL | Status: DC | PRN

## 2022-01-31 MED ORDER — LACTATED RINGERS IV SOLN: INTRAVENOUS | Status: DC

## 2022-01-31 MED ORDER — APIXABAN 5 MG PO TABS
5.0000 mg | ORAL_TABLET | Freq: Two times a day (BID) | ORAL | Status: DC
  Administered 2022-01-31 – 2022-02-03 (×6): 5 mg via ORAL
  Filled 2022-01-31 (×6): qty 1

## 2022-01-31 MED ORDER — ATORVASTATIN CALCIUM 40 MG PO TABS
40.0000 mg | ORAL_TABLET | Freq: Every day | ORAL | Status: DC
Start: 2022-01-31 — End: 2022-02-03
  Administered 2022-01-31 – 2022-02-02 (×3): 40 mg via ORAL
  Filled 2022-01-31 (×3): qty 1

## 2022-01-31 MED ORDER — ALBUTEROL SULFATE (2.5 MG/3ML) 0.083% IN NEBU: 2.5000 mg | INHALATION_SOLUTION | RESPIRATORY_TRACT | Status: DC | PRN

## 2022-01-31 MED ORDER — METOPROLOL SUCCINATE ER 100 MG PO TB24
100.0000 mg | ORAL_TABLET | Freq: Every day | ORAL | Status: DC
Start: 2022-01-31 — End: 2022-02-03
  Administered 2022-01-31 – 2022-02-03 (×4): 100 mg via ORAL
  Filled 2022-01-31 (×4): qty 1

## 2022-01-31 MED ORDER — SODIUM CHLORIDE 0.9 % IV SOLN
2.0000 g | INTRAVENOUS | Status: DC
  Administered 2022-01-31 – 2022-02-01 (×2): 2 g via INTRAVENOUS
  Filled 2022-01-31 (×2): qty 20

## 2022-01-31 MED ORDER — PANTOPRAZOLE SODIUM 40 MG PO TBEC
40.0000 mg | DELAYED_RELEASE_TABLET | Freq: Every day | ORAL | Status: DC
  Administered 2022-01-31 – 2022-02-03 (×4): 40 mg via ORAL
  Filled 2022-01-31 (×4): qty 1

## 2022-01-31 MED ORDER — VANCOMYCIN HCL 1500 MG/300ML IV SOLN
1500.0000 mg | INTRAVENOUS | Status: DC
  Administered 2022-01-31 – 2022-02-02 (×3): 1500 mg via INTRAVENOUS
  Filled 2022-01-31 (×3): qty 300

## 2022-01-31 MED ORDER — ONDANSETRON HCL 4 MG PO TABS: 4.0000 mg | ORAL_TABLET | Freq: Four times a day (QID) | ORAL | Status: DC | PRN

## 2022-01-31 MED ORDER — SPIRONOLACTONE 12.5 MG HALF TABLET
12.5000 mg | ORAL_TABLET | Freq: Every day | ORAL | Status: DC
Start: 2022-01-31 — End: 2022-02-03
  Administered 2022-01-31 – 2022-02-03 (×4): 12.5 mg via ORAL
  Filled 2022-01-31 (×5): qty 1

## 2022-01-31 MED ORDER — TORSEMIDE 20 MG PO TABS
20.0000 mg | ORAL_TABLET | Freq: Every day | ORAL | Status: DC
Start: 2022-02-01 — End: 2022-02-03
  Administered 2022-02-01 – 2022-02-03 (×3): 20 mg via ORAL
  Filled 2022-01-31 (×3): qty 1

## 2022-01-31 MED ORDER — SENNOSIDES-DOCUSATE SODIUM 8.6-50 MG PO TABS
1.0000 | ORAL_TABLET | Freq: Every evening | ORAL | Status: DC | PRN
Start: 2022-01-31 — End: 2022-02-03

## 2022-01-31 MED ORDER — FUROSEMIDE 10 MG/ML IJ SOLN
40.0000 mg | INTRAMUSCULAR | Status: AC
  Administered 2022-01-31: 40 mg via INTRAVENOUS
  Filled 2022-01-31: qty 4

## 2022-01-31 MED ORDER — EMPAGLIFLOZIN 10 MG PO TABS: 10.0000 mg | ORAL_TABLET | Freq: Every day | ORAL | Status: DC

## 2022-01-31 MED ORDER — TORSEMIDE 20 MG PO TABS: 20.0000 mg | ORAL_TABLET | ORAL | Status: DC

## 2022-01-31 MED ORDER — ACETAMINOPHEN 325 MG PO TABS
650.0000 mg | ORAL_TABLET | Freq: Four times a day (QID) | ORAL | Status: DC | PRN
  Administered 2022-01-31: 650 mg via ORAL
  Filled 2022-01-31: qty 2

## 2022-01-31 MED ORDER — SACUBITRIL-VALSARTAN 49-51 MG PO TABS
1.0000 | ORAL_TABLET | Freq: Two times a day (BID) | ORAL | Status: DC
Start: 2022-01-31 — End: 2022-02-03
  Administered 2022-01-31 – 2022-02-03 (×6): 1 via ORAL
  Filled 2022-01-31 (×7): qty 1

## 2022-01-31 MED ORDER — TORSEMIDE 20 MG PO TABS
20.0000 mg | ORAL_TABLET | Freq: Every day | ORAL | Status: DC
  Administered 2022-01-31: 20 mg via ORAL
  Filled 2022-01-31: qty 1

## 2022-01-31 MED ORDER — SODIUM CHLORIDE 0.9 % IV BOLUS
1000.0000 mL | Freq: Once | INTRAVENOUS | Status: AC
  Administered 2022-01-31: 1000 mL via INTRAVENOUS

## 2022-01-31 MED ORDER — ACETAMINOPHEN 325 MG PO TABS: 650.0000 mg | ORAL_TABLET | Freq: Four times a day (QID) | ORAL | Status: DC | PRN

## 2022-01-31 MED ORDER — CYANOCOBALAMIN 500 MCG PO TABS
500.0000 ug | ORAL_TABLET | Freq: Two times a day (BID) | ORAL | Status: DC
Start: 2022-01-31 — End: 2022-02-03
  Administered 2022-01-31 – 2022-02-03 (×6): 500 ug via ORAL
  Filled 2022-01-31 (×7): qty 1

## 2022-01-31 MED ORDER — VITAMIN D 25 MCG (1000 UNIT) PO TABS
1000.0000 [IU] | ORAL_TABLET | Freq: Every day | ORAL | Status: DC
  Administered 2022-01-31 – 2022-02-03 (×4): 1000 [IU] via ORAL
  Filled 2022-01-31 (×4): qty 1

## 2022-01-31 MED ORDER — ASPIRIN 81 MG PO TBEC
81.0000 mg | DELAYED_RELEASE_TABLET | Freq: Every day | ORAL | Status: DC
  Administered 2022-01-31 – 2022-02-03 (×4): 81 mg via ORAL
  Filled 2022-01-31 (×4): qty 1

## 2022-01-31 MED ORDER — BENZONATATE 100 MG PO CAPS
100.0000 mg | ORAL_CAPSULE | Freq: Three times a day (TID) | ORAL | Status: DC | PRN
Start: 2022-01-31 — End: 2022-02-03

## 2022-01-31 MED ORDER — LEVOTHYROXINE SODIUM 100 MCG PO TABS
200.0000 ug | ORAL_TABLET | Freq: Every day | ORAL | Status: DC
  Administered 2022-01-31 – 2022-02-03 (×4): 200 ug via ORAL
  Filled 2022-01-31 (×4): qty 2

## 2022-01-31 NOTE — Plan of Care (Signed)
  Problem: Clinical Measurements: Goal: Ability to maintain clinical measurements within normal limits will improve Outcome: Progressing   

## 2022-01-31 NOTE — Sepsis Progress Note (Signed)
Code Sepsis protocol being monitored by eLink. 

## 2022-01-31 NOTE — ED Provider Notes (Signed)
[facility] EMERGENCY DEPARTMENT Provider Note   CSN: [id number] Arrival date & time: 01/31/22  5573     History  Chief Complaint  Patient presents with   Code Sepsis    Mason Patton is a 76 y.o. male.  HPI  This patient is a 76 year old male, currently on medications including Eliquis, Jardiance, glipizide, levothyroxine, metformin, metoprolol, nortriptyline and torsemide.  He presents to the hospital with a complaint of fever and generalized weakness.  According to the patient he had gone to sleep in his recliner last night, he woke up this morning and could not get out of the recliner without significant assistance and could not walk by himself.  He reports that he was generally weak and was found to have a tactile fever though he did not measure febrile by the paramedics tools.  He was not tachycardic but was noted to be in atrial fibrillation of which he has a history of.  He denies coughing or shortness of breath or rashes but does have some urinary frequency but reports that he is on a diuretic.  He does not have any dysuria.  Evidently the patient recently had some blood dental work done and not infrequently gets dental infections, not infrequently gets urinary infections  Home Medications Prior to Admission medications   Medication Sig Start Date End Date Taking? Authorizing Provider  acetaminophen (TYLENOL) 325 MG tablet Take 2 tablets (650 mg total) by mouth every 4 (four) hours as needed for headache or mild pain. 09/15/20  Yes Lars Pinks M, PA-C  amoxicillin (AMOXIL) 500 MG capsule Take 500 mg by mouth See admin instructions. Only takes for dental procedures   Yes [provider]  apixaban (ELIQUIS) 5 MG TABS tablet Take 5 mg by mouth 2 (two) times daily.   Yes [provider]  aspirin EC 81 MG EC tablet Take 1 tablet (81 mg total) by mouth daily. Swallow whole. 09/18/20  Yes Conte, Tessa N, PA-C  atorvastatin (LIPITOR) 40 MG  tablet Take 40 mg by mouth daily in the afternoon. 11/22/15  Yes [provider]  benzonatate (TESSALON) 100 MG capsule Take 100 mg by mouth 3 (three) times daily as needed for cough.   Yes [provider]  cholecalciferol (VITAMIN D3) 25 MCG (1000 UNIT) tablet Take 1,000 Units by mouth daily.   Yes [provider]  Cyanocobalamin (VITAMIN B12) 500 MCG TABS Take 500 mcg by mouth 2 (two) times daily.   Yes [provider]  empagliflozin (JARDIANCE) 10 MG TABS tablet Take 1 tablet (10 mg total) by mouth daily before breakfast. 02/12/21  Yes Freada Bergeron, MD  ferrous sulfate 325 (65 FE) MG tablet Take 325 mg by mouth every Monday, Wednesday, and Friday. 03/21/20  Yes [provider]  glipiZIDE (GLUCOTROL) 10 MG tablet Take 10 mg by mouth 2 (two) times daily before a meal.   Yes [provider]  HYDROcodone-acetaminophen (NORCO/VICODIN) 5-325 MG tablet Take 1 tablet by mouth every 6 (six) hours as needed for moderate pain.   Yes [provider]  levothyroxine (SYNTHROID) 200 MCG tablet Take 1 tablet (200 mcg total) by mouth daily. 11/07/20 01/31/22 Yes Sanjuan Dame, MD  metFORMIN (GLUCOPHAGE) 500 MG tablet Take 1,000 mg by mouth 2 (two) times daily with a meal.   Yes [provider]  metoprolol succinate (TOPROL-XL) 100 MG 24 hr tablet Take 1 tablet (100 mg total) by mouth daily. Take with or immediately following a meal. 03/08/21  Yes Freada Bergeron, MD  Multiple Vitamins-Minerals (CENTRUM SILVER 50+MEN) TABS Take 1 tablet by mouth daily with breakfast.   Yes [provider]  Multiple Vitamins-Minerals (PRESERVISION AREDS PO) Take 1 capsule by mouth 2 (two) times daily.   Yes [provider]  nitroGLYCERIN (NITROSTAT) 0.4 MG SL tablet Place 0.4 mg under the tongue every 5 (five) minutes as needed for chest pain. 08/17/20  Yes [provider]  nortriptyline (PAMELOR) 10 MG capsule Take 10 mg by mouth  See admin instructions. Take 10 mg by mouth 30-45 minutes prior to bedtime for overactive intestinal muscles   Yes [provider]  omeprazole (PRILOSEC) 20 MG capsule Take 20 mg by mouth daily before breakfast. 01/18/20  Yes [provider]  Pseudoephedrine-guaiFENesin ([place] D PO) Take 1 tablet by mouth every 12 (twelve) hours as needed (for coughing).   Yes [provider]  sacubitril-valsartan (ENTRESTO) 49-51 MG Take 1 tablet by mouth 2 (two) times daily. 02/19/21  Yes Freada Bergeron, MD  spironolactone (ALDACTONE) 25 MG tablet Take 0.5 tablets (12.5 mg total) by mouth daily. 06/12/21 01/31/22 Yes Weaver, Scott T, PA-C  torsemide (DEMADEX) 20 MG tablet Take 20 mg by mouth See admin instructions. Take 20 mg by mouth in the morning and may take an additional 20 mg once a day as directed for a period of 3 days, for an overnight weight gain of 5 pounds 02/28/21  Yes Supple, Megan E, RPH-CPP  VENTOLIN HFA 108 (90 Base) MCG/ACT inhaler Inhale 1-2 puffs into the lungs every 4 (four) hours as needed for wheezing or shortness of breath.  07/12/15  Yes [provider]  Blood Glucose Monitoring Suppl (ONETOUCH VERIO FLEX SYSTEM) W/DEVICE KIT 1 each by Does not apply route daily. Dx: E11.9 03/30/15   Philemon Kingdom, MD  glucose blood (ONETOUCH VERIO) test strip Use to test blood sugar 2-3 times daily as instructed. Dx: E11.9 10/12/15   Philemon Kingdom, MD  [facility] DELICA LANCETS FINE MISC Use to test blood sugar 2-3 times daily as instructed. Dx: E11.9 10/12/15   Philemon Kingdom, MD      Allergies    Codeine and Demerol    Review of Systems   Review of Systems  All other systems reviewed and are negative.   Physical Exam Updated Vital Signs BP (!) 154/83   Pulse 84   Temp 99.5 F (37.5 C) (Oral)   Resp (!) 38   Ht 1.778 m (_0 )   Wt 104.9 kg   SpO2 99%   BMI 33.18 kg/m  Physical Exam Vitals and nursing note reviewed.  Constitutional:       General: He is not in acute distress.    Appearance: He is well-developed.  HENT:     Head: Normocephalic and atraumatic.     Mouth/Throat:     Pharynx: No oropharyngeal exudate.  Eyes:     General: No scleral icterus.       Right eye: No discharge.        Left eye: No discharge.     Conjunctiva/sclera: Conjunctivae normal.     Pupils: Pupils are equal, round, and reactive to light.  Neck:     Thyroid: No thyromegaly.     Vascular: No JVD.  Cardiovascular:     Rate and Rhythm: Tachycardia present. Rhythm irregular.     Heart sounds: Normal heart sounds. No murmur heard.    No friction rub. No gallop.     Comments: This patient  has a slightly irregular rhythm, soft murmur, likely systolic Pulmonary:     Effort: Pulmonary effort is normal. No respiratory distress.     Breath sounds: Normal breath sounds. No wheezing or rales.  Abdominal:     General: Bowel sounds are normal. There is no distension.     Palpations: Abdomen is soft. There is no mass.     Tenderness: There is no abdominal tenderness.  Musculoskeletal:        General: No tenderness. Normal range of motion.     Cervical back: Normal range of motion and neck supple.     Right lower leg: No edema.     Left lower leg: No edema.  Lymphadenopathy:     Cervical: No cervical adenopathy.  Skin:    General: Skin is warm and dry.     Findings: No erythema or rash.  Neurological:     Mental Status: He is alert.     Coordination: Coordination normal.     Comments: Patient is moving all 4 extremities, he has some torticollis with his head to the left but is able to move to the right somewhat.  He has no neck pain on palpation.  He is able to follow all my commands, able to move all 4 extremities, cranial nerves III through XII are normal and he is answering questions appropriately  Psychiatric:        Behavior: Behavior normal.     ED Results / Procedures / Treatments   Labs (all labs ordered are listed, but only abnormal  results are displayed) Labs Reviewed  COMPREHENSIVE METABOLIC PANEL - Abnormal; Notable for the following components:      Result Value   Glucose, Bld 100 (*)    Total Bilirubin 1.3 (*)    All other components within normal limits  CBC WITH DIFFERENTIAL/PLATELET - Abnormal; Notable for the following components:   Platelets 146 (*)    All other components within normal limits  PROTIME-INR - Abnormal; Notable for the following components:   Prothrombin Time 16.5 (*)    INR 1.3 (*)    All other components within normal limits  URINALYSIS, ROUTINE W REFLEX MICROSCOPIC - Abnormal; Notable for the following components:   Glucose, UA >=500 (*)    Protein, ur 30 (*)    Bacteria, UA RARE (*)    All other components within normal limits  RESP PANEL BY RT-PCR (FLU A&B, COVID) ARPGX2  CULTURE, BLOOD (ROUTINE X 2)  CULTURE, BLOOD (ROUTINE X 2)  URINE CULTURE  LACTIC ACID, PLASMA  APTT  LACTIC ACID, PLASMA    EKG EKG Interpretation  Date/Time:  Thursday January 31 2022 08:55:03 EDT Ventricular Rate:  93 PR Interval:    QRS Duration: 114 QT Interval:  382 QTC Calculation: 476 R Axis:   -61 Text Interpretation: Atrial fibrillation Ventricular premature complex Left anterior fascicular block Anterior infarct, old Borderline repolarization abnormality Confirmed by Noemi Chapel [phone]) on 01/31/2022 9:12:06 AM  Radiology DG Chest Port 1 View  Result Date: 01/31/2022 CLINICAL DATA:  Fever question sepsis EXAM: PORTABLE CHEST 1 VIEW COMPARISON:  Portable exam 0900 hours compared to 05/18/2021 FINDINGS: 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 Mediastinal contours and pulmonary vascularity normal. Atherosclerotic calcification aorta. Increased markings at RIGHT base favor atelectasis over infiltrate. Remaining lungs clear. No pleural effusion or pneumothorax. IMPRESSION: Increased markings RIGHT base, favor atelectasis over infiltrate. No additional abnormalities. Aortic  Atherosclerosis (ICD10-I70.0). Electronically Signed   By: Lavonia Dana  M.D.   On: 01/31/2022 09:08    Procedures Procedures    Medications Ordered in ED Medications  lactated ringers infusion ( Intravenous New Bag/Given 01/31/22 0910)  cefTRIAXone (ROCEPHIN) 2 g in sodium chloride 0.9 % 100 mL IVPB (0 g Intravenous Stopped 01/31/22 0950)  acetaminophen (TYLENOL) tablet 650 mg (650 mg Oral Given 01/31/22 0915)  torsemide (DEMADEX) tablet 20 mg (20 mg Oral Given 01/31/22 1213)  sodium chloride 0.9 % bolus 1,000 mL (1,000 mLs Intravenous New Bag/Given 01/31/22 1120)  furosemide (LASIX) injection 40 mg (40 mg Intravenous Given 01/31/22 1213)    ED Course/ Medical Decision Making/ A&P                           Medical Decision Making Amount and/or Complexity of Data Reviewed Labs: ordered. Radiology: ordered. ECG/medicine tests: ordered.  Risk OTC drugs. Prescription drug management. Decision regarding hospitalization.   This patient presents to the ED for concern of sepsis, this involves an extensive number of treatment options, and is a complaint that carries with it a high risk of complications and morbidity.  The differential diagnosis includes sepsis from multiple different potential infections including pneumonia, urinary tract infection, recent dental infection though I see no signs of dental infection on my exam.  There is no trismus, there is no gingival swelling or tenderness, dentition appears intact, there is no pharyngeal swelling and there is no signs of Ludwick's.   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   The pertinent results include: Sepsis work-up, urinalysis negative for signs of infection, there is some glucose urea and protein but no signs of white blood cells or significant bacteria.  COVID-negative, flu negative, lactic acid 1.6 and a metabolic panel which shows no renal dysfunction.  CBC with minimal thrombocytopenia, normal white blood cell count at 9000 and a normal hemoglobin of 14.   Imaging Studies ordered:  I ordered imaging studies including chest x-ray I independently visualized and interpreted imaging which showed portable chest x-ray, possibly an infiltrate but the patient is not coughing or short of breath.  Given that there is no other source of infection we will treat for potential pneumonia I agree with the radiologist interpretation   Cardiac Monitoring: / EKG:  The patient was maintained on a cardiac monitor.  I personally viewed and interpreted the cardiac monitored which showed an underlying rhythm of: Atrial fibrillation, borderline tachycardia   Consultations Obtained:  I requested consultation with the internal medicine doctor,  and discussed lab and imaging findings as well as pertinent plan - they recommend: Admission to the hospital to the resident service   Problem List / ED Course / Critical interventions / Medication management  Fever of unknown origin, potentially pneumonia, antibiotics added I ordered medication including antibiotics for for infection Reevaluation of the patient after these medicines showed that the patient stable I have reviewed the patients home medicines and have made adjustments as needed   Social Determinants of Health:  Patient will need to be admitted to the hospital   Test / Admission - Considered:  We will admit to the resident service.         Final Clinical  Impression(s) / ED Diagnoses Final diagnoses:  Fever, unspecified fever cause    Rx / DC Orders ED Discharge Orders     None         Noemi Chapel, MD 01/31/22 1408

## 2022-01-31 NOTE — ED Triage Notes (Signed)
Pt bib ems from home; pt had dental procedure done Monday, which ems reports he has frequently;  hx uti; pt incontinent, family endorses confusion, pt answering appropriately for ems; warm to touch, temp 31F; hx afib, 10-110, RR 35 ; pt endorses prior dizziness, weakness; dizziness resolved; denies pain; cbg 110; 18 ga lac, 500 fluid given pta; denies N/V, endorses some cough

## 2022-01-31 NOTE — ED Notes (Signed)
MD at bedside. 

## 2022-01-31 NOTE — Progress Notes (Signed)
Pt set up on cpap. ?

## 2022-01-31 NOTE — H&P (Signed)
Date: 01/31/2022               Patient Name:  Mason Patton MRN: [id number]  DOB: 1946-02-23 Age / Sex: 76 y.o., male   PCP: Marlowe Shores, MD              Medical Service: Internal Medicine Teaching Service              Attending Physician: Dr. Erlinda Hong    First Contact: Dimitry Shitarev, MS 3 Pager: [phone]  Second Contact: Dr. Marrianne Mood Pager: [phone]  Third Contact Dr. Sharrell Ku Pager: [phone]       After Hours (After 5p/  First Contact Pager: [phone]  weekends / holidays): Second Contact Pager: [phone]   Chief Complaint:  Fever, generalized weakness  History of Present Illness: (obtained from patient and wife) Mason Patton is a 76 y.o. male with a past medical history of aortic stenosis s/p AVR, COPD, OSA on CPAP, afib on Eliquis, T2DM, HTN, frequent urinary infections, and past hospitalizations for bacteremia, presenting to the ED for fever, confusion, and weakness.  On the evening of 7/26, the patient felt too weak to get to bed. On the morning of 7/27, he felt hot, confused, and tired.  He was unable to stand from his recliner without help.  EMS was called by his wife because his appearance was similar to that of a year ago when he was hospitalized with bacteremia.  Of note, the patient underwent dental cleaning with multiple fillings on 7/24 and and got 2 more fillings on 7/26.  He took amoxicillin 500 mg for prophylaxis prior to the procedures.  Patient has a history of reduced salivary flow and has required multiple dental fillings.  The patient's wife reports that he was hospitalized in May 2022 for COPD exacerbation, then readmitted a day later for sepsis of unknown origin, requiring admission at both [facility] and [facility] with of IV antibiotics course for treatment.  Patient denies shortness of breath or dyspnea, but notes that he feels tired, like he needs his CPAP machine that he uses nightly.  Patient endorses urinary  frequency (which he attributes to torsamide), chronic cough, and daytime drowsiness.  Patient denies nausea, vomiting, diarrhea, dysuria, abdominal pain or fullness, shortness of breath, palpitations, chest pain, peripheral edema, orthopnea, PND, headache, dizziness, and nuchal rigidity.  Review of Systems: A complete ROS was negative except as per HPI.  Past medical/surgical history: (reviewed with patient in room) -- Aortic valve replacement on 09/11/2020 -- Hospitalization for sepsis in 11/2020 -- CABG x2 -- Right ICA stenosis  Past Medical History:  Diagnosis Date   Aortic stenosis    09/30/19 echo ([facility], Cardiologist Dr. 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 OR;  Service: Open Heart Surgery;  Laterality: N/A;   CHOLECYSTECTOMY  09/09/2011   Procedure: LAPAROSCOPIC CHOLECYSTECTOMY WITH INTRAOPERATIVE CHOLANGIOGRAM;  Surgeon: Jetty Duhamel,  MD;  Location: [facility] OR;  Service: General;  Laterality: N/A;   CLIPPING OF ATRIAL APPENDAGE N/A 09/11/2020   Procedure: CLIPPING OF ATRIAL APPENDAGE;  Surgeon: Alleen Borne, MD;  Location: [facility] OR;  Service: Open Heart Surgery;  Laterality: N/A;   CORONARY ARTERY BYPASS GRAFT N/A 09/11/2020   Procedure: CORONARY ARTERY BYPASS GRAFTING (CABG), ON PUMP, TIMES 2 , USING LEFT INTERNAL MAMMARY ARTERY AND ENDOSCOPICALLY HARVESTED RIGHT GREATER SAPHENOUS VEIN;  Surgeon: Alleen Borne, MD;  Location: [facility] OR;  Service: Open Heart Surgery;  Laterality: N/A;    Meds: (reviewed with patient in room) Current Meds  Medication Sig   acetaminophen (TYLENOL) 325 MG tablet Take 2 tablets (650 mg total) by mouth every 4 (four) hours as needed for headache or mild pain.   amoxicillin (AMOXIL) 500 MG capsule Take 500 mg by mouth See admin instructions. Only takes for dental procedures   apixaban (ELIQUIS) 5 MG TABS tablet Take 5 mg by mouth 2 (two) times daily.   aspirin EC 81 MG EC tablet Take 1 tablet (81 mg total) by mouth daily. Swallow whole.   atorvastatin (LIPITOR) 40 MG tablet Take 40 mg by mouth daily in the afternoon.   benzonatate (TESSALON) 100 MG capsule Take 100 mg by mouth 3 (three) times daily as needed for cough.   cholecalciferol (VITAMIN D3) 25 MCG (1000 UNIT) tablet Take 1,000 Units by mouth daily.   Cyanocobalamin (VITAMIN B12) 500 MCG TABS Take 500 mcg by mouth 2 (two) times daily.   empagliflozin (JARDIANCE) 10 MG TABS tablet Take 1 tablet (10 mg total) by mouth daily before breakfast.   ferrous sulfate 325 (65 FE) MG tablet Take 325 mg by mouth every Monday, Wednesday, and Friday.   glipiZIDE (GLUCOTROL) 10 MG tablet Take 10 mg by mouth 2 (two) times daily before a meal.   HYDROcodone-acetaminophen (NORCO/VICODIN) 5-325 MG tablet Take 1 tablet by mouth every 6 (six) hours as needed for moderate pain.   levothyroxine (SYNTHROID) 200 MCG tablet Take 1 tablet (200 mcg total) by mouth daily.    metFORMIN (GLUCOPHAGE) 500 MG tablet Take 1,000 mg by mouth 2 (two) times daily with a meal.   metoprolol succinate (TOPROL-XL) 100 MG 24 hr tablet Take 1 tablet (100 mg total) by mouth daily. Take with or immediately following a meal.   Multiple Vitamins-Minerals (CENTRUM SILVER 50+MEN) TABS Take 1 tablet by mouth daily with breakfast.   Multiple Vitamins-Minerals (PRESERVISION AREDS PO) Take 1 capsule by mouth 2 (two) times daily.   nitroGLYCERIN (NITROSTAT) 0.4 MG SL tablet Place 0.4 mg under the tongue every 5 (five) minutes as needed for chest pain.   nortriptyline (PAMELOR) 10 MG capsule Take 10 mg by mouth See admin instructions. Take 10 mg by mouth 30-45 minutes prior to bedtime for overactive intestinal muscles  omeprazole (PRILOSEC) 20 MG capsule Take 20 mg by mouth daily before breakfast.   sacubitril-valsartan (ENTRESTO) 49-51 MG Take 1 tablet by mouth 2 (two) times daily.   spironolactone (ALDACTONE) 25 MG tablet Take 0.5 tablets (12.5 mg total) by mouth daily.   torsemide (DEMADEX) 20 MG tablet Take 20 mg by mouth See admin instructions. Take 20 mg by mouth in the morning and may take an additional 20 mg once a day as directed for a period of 3 days, for an overnight weight gain of 5 pounds   VENTOLIN HFA 108 (90 Base) MCG/ACT inhaler Inhale 1-2 puffs into the lungs every 4 (four) hours as needed for wheezing or shortness of breath.    [DISCONTINUED] Pseudoephedrine-guaiFENesin (MUCINEX D PO) Take 1 tablet by mouth every 12 (twelve) hours as needed (for coughing).   Allergies: Allergies as of 01/31/2022 - Review Complete 01/31/2022  Allergen Reaction Noted   Codeine Nausea And Vomiting 09/08/2011   Demerol Nausea And Vomiting 09/08/2011   Family History: Diabetes: Maternal aunt Heart disease: Mother Cancer: Grandmother  Social History: Patient lives with wife at home. Receives care from [place], is a [place] veteran. Regularly attends church and active in his community. Able to  ambulate and complete all ADLs independently.  Substance use: Smoked 1/2 ppd for 60 years. Drinks socially. Denies other substance use.  Physical Exam: Blood pressure (!) 169/75, pulse 73, temperature 99.5 F (37.5 C), temperature source Oral, resp. rate (!) 29, height 5\' 10"  (1.778 m), weight 104.9 kg, SpO2 96 %. General:  Resting in bed, appears uncomfortable but in no acute distress. HEENT: No meningismus. Cardiovascular:  Irregularly irregular. No rubs or gallops. 2/6 humming systolic murmur heard best at LUSB. Normal S1/S2. 2+ radial and pedal pulses. Respiratory:  Tachypnea. Cheyne-Stokes breathing. Bibasilar crackles heard on auscultation, mild end-expiratory wheezing. Abdominal:  No tenderness to palpation. No rebound or guarding. Extremities:  No abscess, petechiae, or splinter hemorrhages. No cyanosis or edema. Normal skin turgor. Capillary refill <2 seconds. Skin:  Warm, dry. Scattered light ecchymosis on arms and wrists. Dry eczematous skin on feet. Neuro: Alert and oriented. No focal deficits. Strength 5/5 in all extremities bilaterally. Psych:  Full range affect. Pleasant, appropriate.  Labs: Urinalysis    Component Value Date/Time   COLORURINE YELLOW 01/31/2022 1216   APPEARANCEUR CLEAR 01/31/2022 1216   LABSPEC 1.016 01/31/2022 1216   PHURINE 6.0 01/31/2022 1216   GLUCOSEU >=500 (A) 01/31/2022 1216   HGBUR NEGATIVE 01/31/2022 1216   BILIRUBINUR NEGATIVE 01/31/2022 1216   KETONESUR NEGATIVE 01/31/2022 1216   PROTEINUR 30 (A) 01/31/2022 1216   UROBILINOGEN 2.0 (H) 03/22/2013 2304   NITRITE NEGATIVE 01/31/2022 1216   LEUKOCYTESUR NEGATIVE 01/31/2022 1216    CBC    Component Value Date/Time   WBC 9.0 01/31/2022 0850   RBC 4.50 01/31/2022 0850   HGB 14.2 01/31/2022 0850   HGB 9.8 (L) 09/05/2020 1715   HCT 42.5 01/31/2022 0850   HCT 32.6 (L) 09/05/2020 1715   PLT 146 (L) 01/31/2022 0850   PLT 198 09/05/2020 1715   MCV 94.4 01/31/2022 0850   MCV 73 (L)  09/05/2020 1715   MCH 31.6 01/31/2022 0850   MCHC 33.4 01/31/2022 0850   RDW 12.9 01/31/2022 0850   RDW 16.6 (H) 09/05/2020 1715   LYMPHSABS 1.0 01/31/2022 0850   MONOABS 0.7 01/31/2022 0850   EOSABS 0.1 01/31/2022 0850   BASOSABS 0.0 01/31/2022 0850      Latest Ref Rng & Units 01/31/2022  8:50 AM 06/25/2021   12:14 PM 05/19/2021    7:52 AM  BMP  Glucose 70 - 99 mg/dL 696  295  91   BUN 8 - 23 mg/dL 16  15  12    Creatinine 0.61 - 1.24 mg/dL  2.84  1.32   BUN/Creat Ratio 10 - 24  19    Sodium 135 - 145 mmol/L 138  140  132   Potassium 3.5 - 5.1 mmol/L 3.8  4.5  3.3   Chloride 98 - 111 mmol/L 103  100  98   CO2 22 - 32 mmol/L 26  25  26    Calcium 8.9 - 10.3 mg/dL 8.9  9.4  8.6    -- Blood Cx pending -- Urine Cx pending  Imaging:  EKG: Atrial fibrillation. No ST elevations. CXR: No densities or consolidation. No pleural effusion. Increased markings consistent with atelectasis at right lung base.  Assessment & Plan by Problem: Principal Problem:   SIRS (systemic inflammatory response syndrome) (HCC)  EVO ADERMAN is a 76 y.o. male with a past medical history of aortic stenosis s/p AVR, COPD, OSA on CPAP, afib on Eliquis, T2DM, HTN, frequent urinary infections, and past hospitalizations for bacteremia, presenting to the ED with altered mental status and generalized weakness, found to be febrile to 38.7 C. IMTS consulted for admission due to concern for sepsis. His condition is stable.  Fever Generalized weakness Altered mental status Patient was febrile to 38.7C on arrival to the ED.  No longer febrile or altered. Patient appears euvolemic and does not currently meet sepsis criteria.  His UA is not consistent with UTI.  CXR is not consistent with pneumonia.  Blood and urine cultures are pending.  Hospitalized in 11/09/20 for methicillin resistant staphylococcus epidermidis bacteremia. Given patient's history of bacteremia, recent dental procedures, and his bioprosthetic  heart valve, we will add vancomycin.  He is at elevated risk of endocarditis due to aortic valve replacement and recent oral procedures.  However, in the absence of sufficient modified [facility] clinical criteria for endocarditis, patient has low pretest probability and index of suspicion for endocarditis remains low. We will defer ordering an echo for now. -- Continue empiric treatment with ceftriaxone and vancomycin -- f/u blood cultures and urine cultures -- MRSA PCR pending -- Hold acetaminophen to monitor for fever -- Discontinue maintenance fluids -- Consult PT/OT -- a.m. CBC  Atrial fibrillation Patient in afib on admission. -- Restart home Eliquis 5 mg BID  Heart failure with improved ejection fraction HTN Patient has tachypnea and Cheyne-Stokes breathing on physical exam, but does not appear to have physical exam signs of fluid retention or heart failure exacerbation.  He is hypertensive on admission and reports that he has not taken his HTN medications since yesterday.  Patient had 45-50% EF on 09/06/2020 and received his aortic valve replacement on 09/11/2020, after which EF improved markedly.  Most recent echo was done on 11/16/2020 showing EF of 60-65%.  Patient continues on GDMT. -- Restart home metoprolol 100 mg daily, Entresto 49-51 mg BID, spironolactone 12.5 mg daily, and torsemide 20 mg daily -- a.m. BMP  COPD Cough Patient has mild wheezing and tachypnea on physical exam and reports cough for at least the past 2 weeks.  Presentation is not consistent with COPD exacerbation, but will treat with albuterol nebulizer for any SOB.  Discussed smoking cessation and patient is not interested in treatment at this time.  He will follow-up with PCP at [facility]. -- Albuterol neb  Q4 PRN for shortness of breath -- Restart home benzonatate 100 mg TID PRN for cough  OSA Patient reports feeling tired during the day the past week.  He uses CPAP consistently at home. -- Nightly CPAP  T2DM Last A1c on  record 6.4 on 05/18/2021.  UA shows glycosuria and patient has a history of UTIs, which is consistent with his SLGT-2 inhibitor use. -- follow up hemoglobin a1c -- Hold home jardiance for now -- Hold metformin this admission  Hypothyroidism Last TSH on record 0.185 on 11/05/2020. Patient follows with PCP for this condition. -- Restart home levothyroxine 200 mcg daily  HLD -- Restart home atorvastatin 40 mg daily  GERD -- Restart home pantoprazole 40 mg  DVT prophx: Patient already on therapeutic Eliquis dose Diet: Regular Bowel: PRN Code: DNR  Prior to Admission Living Arrangement: Home Anticipated Discharge Location: Home Barriers to Discharge: Medical Workup, IV antibiotics pending blood cultures  Dispo: Admit patient to Observation with expected length of stay less than 2 midnights.  SignedSharion Dove, Dimitry, Medical Student 01/31/2022, 4:26 PM  Pager: [phone]   Attestation for Student Documentation:  I personally was present and performed or re-performed the history, physical exam and medical decision-making activities of this service and have verified that the service and findings are accurately documented in the student's note.  Marrianne Mood, MD 01/31/2022, 6:13 PM

## 2022-01-31 NOTE — Progress Notes (Signed)
Pharmacy Antibiotic Note  Mason Patton is a 76 y.o. male admitted on 01/31/2022 with sepsis.  Pharmacy has been consulted for vanc dosing.  Pt presented with fever and weakness. He has a hx of MRSE bacteremia in the past. He is not suspected for PNA or UTI. Due to his prior risk of bacteremia and valve replacement, vanc has been added on to ceftriaxone.  Scr 1 Wbc wnl  Plan: Vanc 1.5g IV q24>>AUC 490, scr 1 Levels as needed  Height: 5\' 10"  (177.8 cm) Weight: 104.9 kg (231 lb 4.2 oz) IBW/kg (Calculated) : 73  Temp (24hrs), Avg:100.3 F (37.9 C), Min:98.9 F (37.2 C), Max:101.7 F (38.7 C)  Recent Labs  Lab 01/31/22 0850  WBC 9.0  CREATININE 1.03  LATICACIDVEN 1.6    Estimated Creatinine Clearance: 74 mL/min (by C-G formula based on SCr of 1.03 mg/dL).    Allergies  Allergen Reactions   Codeine Nausea And Vomiting   Demerol Nausea And Vomiting    Antimicrobials this admission: 7/27 ceftriaxone>> 7/27 vanc>>  Dose adjustments this admission:   Microbiology results: MRSA>>pend 7/27 urine>> 7/27 blood>>  8/27, PharmD, [place], AAHIVP, CPP Infectious Disease Pharmacist 01/31/2022 6:30 PM

## 2022-02-01 ENCOUNTER — Inpatient Hospital Stay (HOSPITAL_COMMUNITY): Payer: No Typology Code available for payment source

## 2022-02-01 DIAGNOSIS — R7881 Bacteremia: Secondary | ICD-10-CM

## 2022-02-01 DIAGNOSIS — Z952 Presence of prosthetic heart valve: Secondary | ICD-10-CM

## 2022-02-01 DIAGNOSIS — Z951 Presence of aortocoronary bypass graft: Secondary | ICD-10-CM | POA: Diagnosis not present

## 2022-02-01 DIAGNOSIS — Z885 Allergy status to narcotic agent status: Secondary | ICD-10-CM | POA: Diagnosis not present

## 2022-02-01 DIAGNOSIS — Z66 Do not resuscitate: Secondary | ICD-10-CM | POA: Diagnosis present

## 2022-02-01 DIAGNOSIS — R6510 Systemic inflammatory response syndrome (SIRS) of non-infectious origin without acute organ dysfunction: Secondary | ICD-10-CM | POA: Diagnosis present

## 2022-02-01 DIAGNOSIS — E785 Hyperlipidemia, unspecified: Secondary | ICD-10-CM | POA: Diagnosis present

## 2022-02-01 DIAGNOSIS — F17210 Nicotine dependence, cigarettes, uncomplicated: Secondary | ICD-10-CM

## 2022-02-01 DIAGNOSIS — E039 Hypothyroidism, unspecified: Secondary | ICD-10-CM | POA: Diagnosis present

## 2022-02-01 DIAGNOSIS — Z20822 Contact with and (suspected) exposure to covid-19: Secondary | ICD-10-CM | POA: Diagnosis present

## 2022-02-01 DIAGNOSIS — G4733 Obstructive sleep apnea (adult) (pediatric): Secondary | ICD-10-CM | POA: Diagnosis present

## 2022-02-01 DIAGNOSIS — Z8673 Personal history of transient ischemic attack (TIA), and cerebral infarction without residual deficits: Secondary | ICD-10-CM | POA: Diagnosis not present

## 2022-02-01 DIAGNOSIS — I110 Hypertensive heart disease with heart failure: Secondary | ICD-10-CM

## 2022-02-01 DIAGNOSIS — I4891 Unspecified atrial fibrillation: Secondary | ICD-10-CM | POA: Diagnosis not present

## 2022-02-01 DIAGNOSIS — Z79899 Other long term (current) drug therapy: Secondary | ICD-10-CM | POA: Diagnosis not present

## 2022-02-01 DIAGNOSIS — E119 Type 2 diabetes mellitus without complications: Secondary | ICD-10-CM | POA: Diagnosis present

## 2022-02-01 DIAGNOSIS — I4819 Other persistent atrial fibrillation: Secondary | ICD-10-CM | POA: Diagnosis present

## 2022-02-01 DIAGNOSIS — I5032 Chronic diastolic (congestive) heart failure: Secondary | ICD-10-CM | POA: Diagnosis present

## 2022-02-01 DIAGNOSIS — R350 Frequency of micturition: Secondary | ICD-10-CM | POA: Diagnosis present

## 2022-02-01 DIAGNOSIS — Z7901 Long term (current) use of anticoagulants: Secondary | ICD-10-CM | POA: Diagnosis not present

## 2022-02-01 DIAGNOSIS — R81 Glycosuria: Secondary | ICD-10-CM | POA: Diagnosis present

## 2022-02-01 DIAGNOSIS — K219 Gastro-esophageal reflux disease without esophagitis: Secondary | ICD-10-CM | POA: Diagnosis present

## 2022-02-01 DIAGNOSIS — J449 Chronic obstructive pulmonary disease, unspecified: Secondary | ICD-10-CM

## 2022-02-01 DIAGNOSIS — J439 Emphysema, unspecified: Secondary | ICD-10-CM | POA: Diagnosis present

## 2022-02-01 DIAGNOSIS — I509 Heart failure, unspecified: Secondary | ICD-10-CM

## 2022-02-01 DIAGNOSIS — Z79890 Hormone replacement therapy (postmenopausal): Secondary | ICD-10-CM | POA: Diagnosis not present

## 2022-02-01 DIAGNOSIS — E669 Obesity, unspecified: Secondary | ICD-10-CM | POA: Diagnosis present

## 2022-02-01 DIAGNOSIS — Z7984 Long term (current) use of oral hypoglycemic drugs: Secondary | ICD-10-CM | POA: Diagnosis not present

## 2022-02-01 DIAGNOSIS — Z7982 Long term (current) use of aspirin: Secondary | ICD-10-CM | POA: Diagnosis not present

## 2022-02-01 LAB — BLOOD CULTURE ID PANEL (REFLEXED) - BCID2

## 2022-02-01 LAB — ECHOCARDIOGRAM COMPLETE
AR max vel: 2.11 cm2
AV Area VTI: 2.56 cm2
AV Area mean vel: 1.98 cm2
AV Mean grad: 14 mmHg
AV Peak grad: 21.2 mmHg
Ao pk vel: 2.3 m/s
Area-P 1/2: 3.64 cm2
Height: 70 in
MV M vel: 2.18 m/s
MV Peak grad: 19 mmHg
S' Lateral: 3.2 cm
Weight: 3700.2 oz

## 2022-02-01 LAB — LIPID PANEL
Cholesterol: 82 mg/dL (ref 0–200)
HDL: 20 mg/dL — ABNORMAL LOW (ref >40)
LDL Cholesterol: 46 mg/dL (ref 0–99)
Total CHOL/HDL Ratio: 4.1 RATIO
Triglycerides: 78 mg/dL (ref <150)
VLDL: 16 mg/dL (ref 0–40)

## 2022-02-01 LAB — CBC
HCT: 39.9 % (ref 39.0–52.0)
Hemoglobin: 13.8 g/dL (ref 13.0–17.0)
MCH: 31.7 pg (ref 26.0–34.0)
MCHC: 34.6 g/dL (ref 30.0–36.0)
MCV: 91.7 fL (ref 80.0–100.0)
Platelets: 148 10*3/uL — ABNORMAL LOW (ref 150–400)
RBC: 4.35 MIL/uL (ref 4.22–5.81)
RDW: 12.6 % (ref 11.5–15.5)
WBC: 9.5 10*3/uL (ref 4.0–10.5)
nRBC: 0 % (ref 0.0–0.2)

## 2022-02-01 LAB — URINE CULTURE: Culture: NO GROWTH

## 2022-02-01 LAB — BASIC METABOLIC PANEL
Anion gap: 10 (ref 5–15)
BUN: 14 mg/dL (ref 8–23)
CO2: 24 mmol/L (ref 22–32)
Calcium: 8.6 mg/dL — ABNORMAL LOW (ref 8.9–10.3)
Chloride: 100 mmol/L (ref 98–111)
Creatinine, Ser: 0.86 mg/dL (ref 0.61–1.24)
GFR, Estimated: >60 mL/min (ref >60)
Glucose, Bld: 113 mg/dL — ABNORMAL HIGH (ref 70–99)
Potassium: 3.5 mmol/L (ref 3.5–5.1)
Sodium: 134 mmol/L — ABNORMAL LOW (ref 135–145)

## 2022-02-01 LAB — HEMOGLOBIN A1C
Hgb A1c MFr Bld: 5.7 % — ABNORMAL HIGH (ref 4.8–5.6)
Mean Plasma Glucose: 116.89 mg/dL

## 2022-02-01 LAB — TSH: TSH: 0.446 u[IU]/mL (ref 0.350–4.500)

## 2022-02-01 MED ORDER — PERFLUTREN LIPID MICROSPHERE
1.0000 mL | INTRAVENOUS | Status: AC | PRN
  Administered 2022-02-01: 4 mL via INTRAVENOUS

## 2022-02-01 NOTE — Progress Notes (Signed)
Subjective: Mason Patton is a 76 yo person on day 2 of hospitalization undergoing IV antibiotic treatment for bacteremia.  No acute overnight events.  This morning, the patient reports that he is more short of breath than yesterday and that he is not feeling very well.  He feels tired and is worried about his continued weakness and difficulty walking.  He does report that he was able to stand and move to the chair with PT today.  Objective:  Vital signs in last 24 hours: Vitals:   01/31/22 1642 01/31/22 1815 02/01/22 0142 02/01/22 0702  BP:  (!) 157/89 138/81 (!) 145/73  Pulse:  78 79 79  Resp:  17 17 18   Temp: (!) 100.9 F (38.3 C) 98.9 F (37.2 C) 98.2 F (36.8 C)   TempSrc: Oral Oral    SpO2:  95% 92% 95%  Weight:      Height:       Intake/Output Summary (Last 24 hours) at 02/01/2022 0710 Last data filed at 02/01/2022 0500 Gross per 24 hour  Intake 300 ml  Output 700 ml  Net -400 ml   General:  Patient in chair, appears mildly uncomfortable but in no acute distress. HEENT:  No nuchal rigidity. Cardiovascular:  Irregularly irregular. No rubs or gallops. 2/6 systolic murmur heard best at LUSB. Respiratory:  Tachypnea. Mild bibasilar crackles heard on auscultation, mild end-expiratory wheezing. Extremities:  Euvolemic. No abscess, petechiae, Osler's nodes, or splinter hemorrhages. No cyanosis or edema. Normal skin turgor. Capillary refill <2 seconds. Skin:  Warm, dry. Scattered light ecchymosis on arms and wrists. Dry eczematous skin on feet. Neuro:  Alert and oriented. No focal deficits. Strength 4/5 in all extremities bilaterally, decreased from yesterday. Psych:  Full range affect. Pleasant and appropriate.     Latest Ref Rng & Units 02/01/2022    4:04 AM 01/31/2022    8:50 AM 05/19/2021   11:02 AM  CBC  WBC 4.0 - 10.5 K/uL 9.5  9.0    Hemoglobin 13.0 - 17.0 g/dL 13/06/2021  16.1  7.4   Hematocrit 39.0 - 52.0 % 39.9  42.5  26.0   Platelets 150 - 400 K/uL 148  146         Latest Ref Rng & Units 02/01/2022    4:04 AM 01/31/2022    8:50 AM 06/25/2021   12:14 PM  BMP  Glucose 70 - 99 mg/dL 06/27/2021  045  409   BUN 8 - 23 mg/dL 14  16  15    Creatinine 0.61 - 1.24 mg/dL 811   9.14   BUN/Creat Ratio 10 - 24   19   Sodium 135 - 145 mmol/L 134  138  140   Potassium 3.5 - 5.1 mmol/L 3.5  3.8  4.5   Chloride 98 - 111 mmol/L 100  103  100   CO2 22 - 32 mmol/L 24  26  25    Calcium 8.9 - 10.3 mg/dL 8.6  8.9  9.4     -- MRSA PCR: Negative -- Blood Cx: Gram positive cocci in clusters in aerobic and anaerobic bottles from 1 of 2 sites at 24 hours -- Blood Cx ID panel Reflex: Staphylococcus species grown in aerobic/anaerobic bottles from 1 of 2 sites. -- Urine Cx: Pending  Imaging: No new studies.  Assessment/Plan:  Principal Problem:   SIRS (systemic inflammatory response syndrome) (HCC)  CHANCEY Patton is a 76 y.o. male with a past medical history of aortic stenosis s/p AVR, COPD,  OSA on CPAP, afib on Eliquis, T2DM, HTN, frequent urinary infections, and prior hospitalization for MRSE bacteremia, presenting to the ED with generalized weakness, confusion and fever. He was admitted for concern for sepsis and is now treated for bacteremia found on BCID panel.  Bacteremia Given the patient's new findings of staph species on [facility] ID panel and blood cultures growing gram-positive cocci at 24 hours, patient should be continued on gram-positive cocci antibiotic coverage for bacteremia.  Of note, patient has a history of bacteremia with MRSE, recent dental procedures, and bioprosthetic aortic heart valve.  Patient also has a possible risk of endocarditis based on modified [facility] clinical criteria and TTE should be obtained. TEE can be considered following reevaluation.  He is currently stable. -- Continue antibiotic coverage with vancomycin and discontinue ceftriaxone -- f/u blood cultures and urine cultures, Staph spp. grown on BCID reflex panel -- Obtain TTE -- Hold  acetaminophen to monitor for fever -- PT/OT consulted, appreciate recommendations -- Repeat CBC   Heart failure with improved ejection fraction HTN Patient did not appear to have CHF exacerbation on admission and is euvolemic. He continues to be hypertensive, but is asymptomatic and it is appropriate to more aggressively manage HTN outpatient after discharge.  Most recent echo was done on 11/16/2020 showing EF of 60-65%, but was 45-50% on 09/06/2020 prior to aortic valve replacement.  Patient continues on GDMT. -- Continue home metoprolol 100 mg daily, Entresto 49-51 mg BID, spironolactone 12.5 mg daily, and torsemide 20 mg daily -- Repeat BMP   T2DM Previous A1c on record 6.4 from 05/18/2021.  A1c 5.7 this admission.  UA shows glycosuria and patient has a history of UTIs, which is consistent with his SLGT-2 inhibitor use. -- Hold home jardiance for now d/t infection risk -- Hold metformin this admission  Atrial fibrillation Patient in afib on admission and continues to have afib today.  Patient is on rate control with metoprolol and anticoagulation with Eliquis. -- Continue home Eliquis 5 mg BID  COPD Cough Patient has mild wheezing and tachypnea on physical exam and reports cough for at least the past 2 weeks.  Index of suspicion is low for COPD exacerbation, but will treat with albuterol nebulizer for any SOB.  Discussed smoking cessation and patient is not interested in treatment at this time.  He will follow-up with PCP at [facility]. -- Albuterol neb Q4 PRN for shortness of breath -- Continue home benzonatate 100 mg TID PRN for cough   OSA Patient feels better following overnight CPAP treatment after admission. -- Nightly CPAP   Hypothyroidism Previous TSH on record low at 0.185 on 11/05/2020. TSH this admission WNL. -- Continue home levothyroxine 200 mcg daily   HLD Lipid panel shows low HDL of 20 and all other lipids WNL this admission. -- Continue home atorvastatin 40 mg daily    GERD -- Continue home pantoprazole 40 mg   LOS: 0 days   Jovannie Ulibarri, Medical Student 02/01/2022, 7:10 AM  Pager number: [phone]

## 2022-02-01 NOTE — Evaluation (Signed)
Physical Therapy Evaluation Patient Details Name: Mason Patton MRN: [id number] DOB: July 21, 1945 Today's Date: 02/01/2022  History of Present Illness  76 y.o. male presented to ED on 01/31/22 for fever, confusion, and weakness. Pt with recent dental fillings this week. Admitted for SIRS. PMH: aortic stenosis s/p AVR, COPD, OSA on CPAP, afib on Eliquis, T2DM, HTN, frequent urinary infections, and past hospitalizations for bacteremia  Clinical Impression  Patient admitted with the above. PTA, patient was independent with no AD and living with wife. Patient presents with weakness, impaired balance, decreased activity tolerance, and impaired cognition. Patient required modA+2 to stand from EOB and ambulated in hallway with RW and min guard-A+2 for close chair follow. Patient complaining of SOB but VSS on RA. He demonstrates decreased insight into deficits and poor safety awareness. Patient will benefit from skilled PT services during acute stay to address listed deficits. Recommend HHPT at discharge to maximize functional independence and safety. Wife states she will be able to assist at home.      Recommendations for follow up therapy are one component of a multi-disciplinary discharge planning process, led by the attending physician.  Recommendations may be updated based on patient status, additional functional criteria and insurance authorization.  Follow Up Recommendations Home health PT      Assistance Recommended at Discharge Frequent or constant Supervision/Assistance  Patient can return home with the following  A lot of help with walking and/or transfers;A little help with bathing/dressing/bathroom;Assistance with cooking/housework;Help with stairs or ramp for entrance    Equipment Recommendations Rolling Vlad Mayberry (2 wheels)  Recommendations for Other Services       Functional Status Assessment Patient has had a recent decline in their functional status and demonstrates the ability to  make significant improvements in function in a reasonable and predictable amount of time.     Precautions / Restrictions Precautions Precautions: Fall Restrictions Weight Bearing Restrictions: No      Mobility  Bed Mobility Overal bed mobility: Needs Assistance Bed Mobility: Supine to Sit, Sit to Supine     Supine to sit: Mod assist, HOB elevated Sit to supine: Min assist   General bed mobility comments: modA for trunk elevation and repositioning hips towards EOB. MinA for trunk repositioning in supine    Transfers Overall transfer level: Needs assistance Equipment used: Rolling Emaley Applin (2 wheels) Transfers: Sit to/from Stand Sit to Stand: Mod assist, +2 physical assistance                Ambulation/Gait Ambulation/Gait assistance: Min assist, +2 safety/equipment Gait Distance (Feet): 100 Feet Assistive device: Rolling Cassidie Veiga (2 wheels) Gait Pattern/deviations: Step-through pattern, Decreased stride length, Drifts right/left Gait velocity: decreased     General Gait Details: Assist for balance and RW management initially. Cues for upright posture  Stairs            Wheelchair Mobility    Modified Rankin (Stroke Patients Only)       Balance Overall balance assessment: Needs assistance Sitting-balance support: Bilateral upper extremity supported, Feet supported Sitting balance-Leahy Scale: Poor Sitting balance - Comments: slow posterior lean with patient unaware requiring assistance to shift anterior Postural control: Posterior lean Standing balance support: Bilateral upper extremity supported, During functional activity Standing balance-Leahy Scale: Poor                               Pertinent Vitals/Pain Pain Assessment Pain Assessment: No/denies pain    Home Living Family/patient expects  to be discharged to:: Private residence Living Arrangements: Spouse/significant other;Other relatives (wife's son) Available Help at Discharge:  Family;Available 24 hours/day Type of Home: House Home Access: Stairs to enter Entrance Stairs-Rails: Right;Left;Can reach both Entrance Stairs-Number of Steps: 3   Home Layout: One level Home Equipment: Agricultural consultant (2 wheels);Cane - single point;Shower seat - built in      Prior Function Prior Level of Function : Needs assist             Mobility Comments: reports no AD for mobility typically though requiring assist for sit to stand transfers leading up to admission ADLs Comments: Reports independent with ADLs, wife completing IADLs     Hand Dominance   Dominant Hand: Right    Extremity/Trunk Assessment   Upper Extremity Assessment Upper Extremity Assessment: Defer to OT evaluation    Lower Extremity Assessment Lower Extremity Assessment: Generalized weakness    Cervical / Trunk Assessment Cervical / Trunk Assessment: Kyphotic  Communication   Communication: No difficulties  Cognition Arousal/Alertness: Awake/alert Behavior During Therapy: WFL for tasks assessed/performed, Impulsive, Flat affect Overall Cognitive Status: Impaired/Different from baseline Area of Impairment: Awareness, Safety/judgement, Problem solving                         Safety/Judgement: Decreased awareness of safety, Decreased awareness of deficits Awareness: Emergent Problem Solving: Slow processing, Difficulty sequencing, Requires verbal cues General Comments: decreased insight into deficits        General Comments General comments (skin integrity, edema, etc.): VSS on RA. SOB noted    Exercises     Assessment/Plan    PT Assessment Patient needs continued PT services  PT Problem List Decreased strength;Decreased activity tolerance;Decreased balance;Decreased mobility;Decreased safety awareness;Decreased knowledge of use of DME;Cardiopulmonary status limiting activity       PT Treatment Interventions DME instruction;Gait training;Therapeutic activities;Therapeutic  exercise;Stair training;Functional mobility training;Balance training;Patient/family education    PT Goals (Current goals can be found in the Care Plan section)  Acute Rehab PT Goals Patient Stated Goal: to get stronger PT Goal Formulation: With patient Time For Goal Achievement: 02/15/22 Potential to Achieve Goals: Good    Frequency Min 3X/week     Co-evaluation               AM-PAC PT "6 Clicks" Mobility  Outcome Measure Help needed turning from your back to your side while in a flat bed without using bedrails?: A Little Help needed moving from lying on your back to sitting on the side of a flat bed without using bedrails?: A Little Help needed moving to and from a bed to a chair (including a wheelchair)?: Total Help needed standing up from a chair using your arms (e.g., wheelchair or bedside chair)?: Total Help needed to walk in hospital room?: Total Help needed climbing 3-5 steps with a railing? : Total 6 Click Score: 10    End of Session Equipment Utilized During Treatment: Gait belt Activity Tolerance: Patient tolerated treatment well Patient left: in bed;with call bell/phone within reach;with bed alarm set;with family/visitor present Nurse Communication: Mobility status PT Visit Diagnosis: Unsteadiness on feet (R26.81);Muscle weakness (generalized) (M62.81);Other abnormalities of gait and mobility (R26.89)    Time: 0347-4259 PT Time Calculation (min) (ACUTE ONLY): 21 min   Charges:   PT Evaluation $PT Eval Moderate Complexity: 1 Mod          Shanayah Kaffenberger A. Dan Humphreys PT, DPT Acute Rehabilitation Services Office [phone]   Viviann Spare 02/01/2022, 2:44 PM

## 2022-02-01 NOTE — Evaluation (Signed)
Occupational Therapy Evaluation Patient Details Name: Mason Patton MRN: [id number] DOB: 1945-08-27 Today's Date: 02/01/2022   History of Present Illness Mason Patton is a 76 y.o. male presenting to the ED for fever, confusion, and weakness. Pt with recent dental fillings this week. Admitted for SIRS. PMH: aortic stenosis s/p AVR, COPD, OSA on CPAP, afib on Eliquis, T2DM, HTN, frequent urinary infections, and past hospitalizations for bacteremia   Clinical Impression   PTA, pt lives with family, reports Independence with ADLs/mobility without AD. Leading up to admission, pt reports requiring assist to stand. Pt presents now with deficits in sitting/standing balance, cognition, endurance and strength. Pt able to answer orientation questions, respond appropriately at start of session though noted decreased safety awareness occurring with standing activities. Pt requires Mod A for bed mobility and Max A for transfers using RW with heavy assist to maintain balance in setting of left lateral lean. Pt requires Min A for UB ADL and Max A (x 2 in standing) for LB ADLs. As pt at increased risk for falls and requiring significant physical assist for mobility, recommend SNF rehab at DC.   VSS on RA pre and post activity though pt reports 10/10 fatigue with tasks.     Recommendations for follow up therapy are one component of a multi-disciplinary discharge planning process, led by the attending physician.  Recommendations may be updated based on patient status, additional functional criteria and insurance authorization.   Follow Up Recommendations  Skilled nursing-short term rehab (<3 hours/day)    Assistance Recommended at Discharge Frequent or constant Supervision/Assistance  Patient can return home with the following A lot of help with walking and/or transfers;A lot of help with bathing/dressing/bathroom;Direct supervision/assist for medications management;Direct supervision/assist for financial  management    Functional Status Assessment  Patient has had a recent decline in their functional status and demonstrates the ability to make significant improvements in function in a reasonable and predictable amount of time.  Equipment Recommendations  BSC/3in1    Recommendations for Other Services       Precautions / Restrictions Precautions Precautions: Fall Restrictions Weight Bearing Restrictions: No      Mobility Bed Mobility Overal bed mobility: Needs Assistance Bed Mobility: Supine to Sit     Supine to sit: Mod assist, HOB elevated     General bed mobility comments: assist to lift trunk, scoot hips and gain balance. use of bedrails    Transfers Overall transfer level: Needs assistance Equipment used: Rolling walker (2 wheels) Transfers: Sit to/from Stand, Bed to chair/wheelchair/BSC Sit to Stand: Max assist     Step pivot transfers: Max assist     General transfer comment: Initial sit to stand at Mod A though with fatigue required Max A. pt with significant left lateral lean in standing with heavy assist needed to maintain balance to transfer from soiled bed to recliner. Pt reaching away from walker to nightstand, bedrails, etc with poor acknowledgement of safety cues      Balance Overall balance assessment: Needs assistance Sitting-balance support: Feet supported, Bilateral upper extremity supported Sitting balance-Leahy Scale: Poor Sitting balance - Comments: reliant on UE support, posterior bias Postural control: Posterior lean, Left lateral lean Standing balance support: Bilateral upper extremity supported, During functional activity Standing balance-Leahy Scale: Poor Standing balance comment: reliant on RW and heavy external support                           ADL either performed or  assessed with clinical judgement   ADL Overall ADL's : Needs assistance/impaired Eating/Feeding: Independent;Sitting   Grooming:  Supervision/safety;Sitting   Upper Body Bathing: Minimal assistance;Sitting   Lower Body Bathing: Maximal assistance;+2 for physical assistance;+2 for safety/equipment;Sit to/from stand   Upper Body Dressing : Minimal assistance;Sitting Upper Body Dressing Details (indicate cue type and reason): donning new gown Lower Body Dressing: Maximal assistance;+2 for physical assistance;+2 for safety/equipment;Sit to/from stand Lower Body Dressing Details (indicate cue type and reason): assist for sock mgmt d/t poor sitting balance EOB (does not wear socks at baseline) Toilet Transfer: Maximal assistance;Stand-pivot;Rolling walker (2 wheels);+2 for safety/equipment Toilet Transfer Details (indicate cue type and reason): simulated to chair Toileting- Clothing Manipulation and Hygiene: Maximal assistance;+2 for physical assistance;+2 for safety/equipment;Sit to/from stand         General ADL Comments: Limited by poor sitting/standing balance with noted L lateral lean in standing. poor safety awarness once in standing     Vision Baseline Vision/History: 1 Wears glasses Ability to See in Adequate Light: 0 Adequate Patient Visual Report: No change from baseline Vision Assessment?: No apparent visual deficits     Perception     Praxis      Pertinent Vitals/Pain Pain Assessment Pain Assessment: No/denies pain     Hand Dominance Right   Extremity/Trunk Assessment Upper Extremity Assessment Upper Extremity Assessment: Overall WFL for tasks assessed   Lower Extremity Assessment Lower Extremity Assessment: Defer to PT evaluation   Cervical / Trunk Assessment Cervical / Trunk Assessment: Normal;Other exceptions Cervical / Trunk Exceptions: though with L lateral lean in standing   Communication Communication Communication: No difficulties   Cognition Arousal/Alertness: Awake/alert Behavior During Therapy: WFL for tasks assessed/performed, Impulsive, Flat affect Overall Cognitive  Status: No family/caregiver present to determine baseline cognitive functioning                                 General Comments: Able to answer orientation questions though noted deficits in safety awareness and problem solving once in standing. decreased insight     General Comments  VSS on RA pre and post activity though SOB noted and pt report 10/10 fatigue    Exercises     Shoulder Instructions      Home Living Family/patient expects to be discharged to:: Private residence Living Arrangements: Spouse/significant other;Other relatives (wife's son) Available Help at Discharge: Family;Available 24 hours/day Type of Home: House Home Access: Stairs to enter [employer] of Steps: 3 Entrance Stairs-Rails: Right;Left Home Layout: One level     Bathroom Shower/Tub: Producer, television/film/video: Handicapped height     Home Equipment: Agricultural consultant (2 wheels);Cane - single point;Shower seat - built in          Prior Functioning/Environment Prior Level of Function : Needs assist             Mobility Comments: reports no AD for mobility typically though requiring assist for sit to stand transfers leading up to admission ADLs Comments: Reports independent with ADLs, wife completing IADLs        OT Problem List: Decreased strength;Decreased activity tolerance;Impaired balance (sitting and/or standing);Decreased cognition;Decreased safety awareness;Decreased knowledge of use of DME or AE;Decreased knowledge of precautions      OT Treatment/Interventions: Self-care/ADL training;Therapeutic exercise;Energy conservation;DME and/or AE instruction;Therapeutic activities;Patient/family education;Balance training    OT Goals(Current goals can be found in the care plan section) Acute Rehab OT Goals Patient Stated Goal: regain strength,  resolve infection OT Goal Formulation: With patient Time For Goal Achievement: 02/15/22 Potential to Achieve Goals:  Good  OT Frequency: Min 2X/week    Co-evaluation              AM-PAC OT "6 Clicks" Daily Activity     Outcome Measure Help from another person eating meals?: None Help from another person taking care of personal grooming?: A Little Help from another person toileting, which includes using toliet, bedpan, or urinal?: A Lot Help from another person bathing (including washing, rinsing, drying)?: A Lot Help from another person to put on and taking off regular upper body clothing?: A Little Help from another person to put on and taking off regular lower body clothing?: A Lot 6 Click Score: 16   End of Session Equipment Utilized During Treatment: Gait belt;Rolling walker (2 wheels) Nurse Communication: Mobility status;Need for lift equipment;Other (comment) (primofit malfunction)  Activity Tolerance: Patient tolerated treatment well Patient left: in chair;with call bell/phone within reach;with chair alarm set  OT Visit Diagnosis: Unsteadiness on feet (R26.81);Other abnormalities of gait and mobility (R26.89);Other symptoms and signs involving cognitive function;Muscle weakness (generalized) (M62.81)                Time: 5465-6812 OT Time Calculation (min): 31 min Charges:  OT General Charges $OT Visit: 1 Visit OT Evaluation $OT Eval Moderate Complexity: 1 Mod OT Treatments $Therapeutic Activity: 8-22 mins  Mason Patton, OTR/L Acute Rehab Services Office: [phone]   Lorre Munroe 02/01/2022, 8:19 AM

## 2022-02-01 NOTE — Progress Notes (Addendum)
PHARMACY - PHYSICIAN COMMUNICATION CRITICAL VALUE ALERT - BLOOD CULTURE IDENTIFICATION (BCID)  Mason Patton is an 76 y.o. male who presented to [facility] on 01/31/2022 with a chief complaint of confusion/fever   Name of physician (or Provider) Contacted: Dr. Burnice Logan  Current antibiotics: Vancomycin/Ceftriaxone  Changes to prescribed antibiotics recommended:  No changes for now  Results for orders placed or performed during the hospital encounter of 01/31/22  Blood Culture ID Panel (Reflexed) (Collected: 01/31/2022  8:57 AM)  Result Value Ref Range   Enterococcus faecalis NOT DETECTED NOT DETECTED   Enterococcus Faecium NOT DETECTED NOT DETECTED   Listeria monocytogenes NOT DETECTED NOT DETECTED   Staphylococcus species DETECTED (A) NOT DETECTED   Staphylococcus aureus (BCID) NOT DETECTED NOT DETECTED   Staphylococcus epidermidis NOT DETECTED NOT DETECTED   Staphylococcus lugdunensis NOT DETECTED NOT DETECTED   Streptococcus species NOT DETECTED NOT DETECTED   Streptococcus agalactiae NOT DETECTED NOT DETECTED   Streptococcus pneumoniae NOT DETECTED NOT DETECTED   Streptococcus pyogenes NOT DETECTED NOT DETECTED   A.calcoaceticus-baumannii NOT DETECTED NOT DETECTED   Bacteroides fragilis NOT DETECTED NOT DETECTED   Enterobacterales NOT DETECTED NOT DETECTED   Enterobacter cloacae complex NOT DETECTED NOT DETECTED   Escherichia coli NOT DETECTED NOT DETECTED   Klebsiella aerogenes NOT DETECTED NOT DETECTED   Klebsiella oxytoca NOT DETECTED NOT DETECTED   Klebsiella pneumoniae NOT DETECTED NOT DETECTED   Proteus species NOT DETECTED NOT DETECTED   Salmonella species NOT DETECTED NOT DETECTED   Serratia marcescens NOT DETECTED NOT DETECTED   Haemophilus influenzae NOT DETECTED NOT DETECTED   Neisseria meningitidis NOT DETECTED NOT DETECTED   Pseudomonas aeruginosa NOT DETECTED NOT DETECTED   Stenotrophomonas maltophilia NOT DETECTED NOT DETECTED   Candida albicans NOT  DETECTED NOT DETECTED   Candida auris NOT DETECTED NOT DETECTED   Candida glabrata NOT DETECTED NOT DETECTED   Candida krusei NOT DETECTED NOT DETECTED   Candida parapsilosis NOT DETECTED NOT DETECTED   Candida tropicalis NOT DETECTED NOT DETECTED   Cryptococcus neoformans/gattii NOT DETECTED NOT DETECTED    Abran Duke 02/01/2022  5:08 AM

## 2022-02-01 NOTE — Progress Notes (Signed)
  Echocardiogram 2D Echocardiogram has been performed.  Milda Smart 02/01/2022, 3:26 PM

## 2022-02-02 LAB — CBC
HCT: 42.3 % (ref 39.0–52.0)
Hemoglobin: 14.6 g/dL (ref 13.0–17.0)
MCH: 31.7 pg (ref 26.0–34.0)
MCHC: 34.5 g/dL (ref 30.0–36.0)
MCV: 91.8 fL (ref 80.0–100.0)
Platelets: 147 10*3/uL — ABNORMAL LOW (ref 150–400)
RBC: 4.61 MIL/uL (ref 4.22–5.81)
RDW: 12.7 % (ref 11.5–15.5)
WBC: 8.2 10*3/uL (ref 4.0–10.5)
nRBC: 0 % (ref 0.0–0.2)

## 2022-02-02 LAB — BASIC METABOLIC PANEL
Anion gap: 9 (ref 5–15)
BUN: 17 mg/dL (ref 8–23)
CO2: 26 mmol/L (ref 22–32)
Calcium: 8.8 mg/dL — ABNORMAL LOW (ref 8.9–10.3)
Chloride: 103 mmol/L (ref 98–111)
Creatinine, Ser: 1.07 mg/dL (ref 0.61–1.24)
GFR, Estimated: >60 mL/min (ref >60)
Glucose, Bld: 156 mg/dL — ABNORMAL HIGH (ref 70–99)
Potassium: 3.8 mmol/L (ref 3.5–5.1)
Sodium: 138 mmol/L (ref 135–145)

## 2022-02-02 MED ORDER — UMECLIDINIUM BROMIDE 62.5 MCG/ACT IN AEPB
1.0000 | INHALATION_SPRAY | Freq: Every day | RESPIRATORY_TRACT | Status: DC
  Administered 2022-02-03: 1 via RESPIRATORY_TRACT
  Filled 2022-02-02: qty 7

## 2022-02-02 MED ORDER — TIOTROPIUM BROMIDE MONOHYDRATE 18 MCG IN CAPS: 18.0000 ug | ORAL_CAPSULE | Freq: Every day | RESPIRATORY_TRACT | Status: DC

## 2022-02-02 NOTE — Progress Notes (Signed)
Subjective:   Hospital day: 2  Overnight event: No acute events overnight  Interim History: Patient evaluated at the bedside laying comfortably in bed on CPAP. Patient states he slept fine.  He still feels weak but denies any chest pain, shortness of breath, fever or chills.  States he has been waiting for his breakfast all morning. Discussed disposition plan with patient about OT's recommendation for SNF however patient states he would like to go home with home health PT/OT.   Objective:  Vital signs in last 24 hours: Vitals:   02/01/22 1611 02/01/22 2032 02/01/22 2053 02/02/22 0335  BP: 134/69  138/76 (!) 148/86  Pulse: 75 78 88 76  Resp: 16 16 16 18   Temp: 99.6 F (37.6 C)  99 F (37.2 C) 97.8 F (36.6 C)  TempSrc: Oral  Oral Oral  SpO2: 99% 96% 95% 97%  Weight:      Height:        Filed Weights   01/31/22 0841  Weight: 104.9 kg     Intake/Output Summary (Last 24 hours) at 02/02/2022 0729 Last data filed at 02/02/2022 0332 Gross per 24 hour  Intake 720.5 ml  Output 900 ml  Net -179.5 ml   Net IO Since Admission: -579.5 mL [02/02/22 0729]  No results for input(s): "GLUCAP" in the last 72 hours.   Pertinent Labs:    Latest Ref Rng & Units 02/02/2022   12:52 AM 02/01/2022    4:04 AM 01/31/2022    8:50 AM  CBC  WBC 4.0 - 10.5 K/uL 8.2  9.5  9.0   Hemoglobin 13.0 - 17.0 g/dL 02/02/2022  52.7  78.2   Hematocrit 39.0 - 52.0 % 42.3  39.9  42.5   Platelets 150 - 400 K/uL 147  148  146        Latest Ref Rng & Units 02/02/2022   12:52 AM 02/01/2022    4:04 AM 01/31/2022    8:50 AM  CMP  Glucose 70 - 99 mg/dL 02/02/2022  536  144   BUN 8 - 23 mg/dL 17  14  16    Creatinine 0.61 - 1.24 mg/dL 315   4.00   Sodium 135 - 145 mmol/L 138  134  138   Potassium 3.5 - 5.1 mmol/L 3.8  3.5  3.8   Chloride 98 - 111 mmol/L 103  100  103   CO2 22 - 32 mmol/L 26  24  26    Calcium 8.9 - 10.3 mg/dL 8.8  8.6  8.9   Total Protein 6.5 - 8.1 g/dL   6.6   Total Bilirubin 0.3 - 1.2 mg/dL    1.3   Alkaline Phos 38 - 126 U/L   68   AST 15 - 41 U/L   18   ALT 0 - 44 U/L   15     Imaging: ECHOCARDIOGRAM COMPLETE  Result Date: 02/01/2022    ECHOCARDIOGRAM REPORT   Patient Name:   SAHITH NURSE Date of Exam: 02/01/2022 Medical Rec #:  02/03/2022        Height:       70.0 in Accession #:    [name]       Weight:       231.3 lb Date of Birth:  [birth year]        BSA:          2.220 m Patient Age:    76 years         BP:  119/79 mmHg Patient Gender: M                HR:           68 bpm. Exam Location:  Inpatient Procedure: 2D Echo, Cardiac Doppler, Color Doppler and Intracardiac            Opacification Agent Indications:    Bacteremia  History:        Patient has prior history of Echocardiogram examinations, most                 recent 11/16/2020. CAD, Prior CABG, Carotid Disease, Aortic Valve                 Disease, Arrythmias:Atrial Fibrillation, Signs/Symptoms:Murmur;                 Risk Factors:Diabetes, Hypertension, Former Smoker, Sleep Apnea                 and Dyslipidemia. Edwards bioprosthetic AVR 79mm, emphysema.  Sonographer:    Milda Smart Referring Phys: [id number] [facility]  Sonographer Comments: Patient is morbidly obese. IMPRESSIONS  1. Left ventricular ejection fraction, by estimation, is 50 to 55%. The left ventricle has low normal function. The left ventricle has no regional wall motion abnormalities. There is moderate left ventricular hypertrophy. Left ventricular diastolic parameters are indeterminate.  2. Right ventricular systolic function was not well visualized. The right ventricular size is not well visualized. Tricuspid regurgitation signal is inadequate for assessing PA pressure.  3. Left atrial size was mild to moderately dilated.  4. No evidence of mitral valve regurgitation. Moderate mitral annular calcification.  5. S/p bioprosthetic AVR. No significant PVL. V max 2.3 m/s. Mean gradient 14 mmHg. DI 0.62. Normal bioprosthesis. Aortic valve  regurgitation is not visualized.  6. There is mild dilatation of the ascending aorta, measuring 37 mm.  7. The inferior vena cava is normal in size with greater than 50% respiratory variability, suggesting right atrial pressure of 3 mmHg. Comparison(s): No significant change from prior study. FINDINGS  Left Ventricle: Left ventricular ejection fraction, by estimation, is 50 to 55%. The left ventricle has low normal function. The left ventricle has no regional wall motion abnormalities. Definity contrast agent was given IV to delineate the left ventricular endocardial borders. The left ventricular internal cavity size was normal in size. There is moderate left ventricular hypertrophy. Left ventricular diastolic parameters are indeterminate. Right Ventricle: The right ventricular size is not well visualized. Right ventricular systolic function was not well visualized. Tricuspid regurgitation signal is inadequate for assessing PA pressure. Left Atrium: Left atrial size was mild to moderately dilated. Right Atrium: Right atrial size was normal in size. Pericardium: Trivial pericardial effusion is present. Mitral Valve: Moderate mitral annular calcification. No evidence of mitral valve regurgitation. Tricuspid Valve: Tricuspid valve regurgitation is not demonstrated. Aortic Valve: S/p bioprosthetic AVR. No significant PVL. V max 2.3 m/s. Mean gradient 14 mmHg. DI 0.62. Normal bioprosthesis. Aortic valve regurgitation is not visualized. Aortic valve mean gradient measures 14.0 mmHg. Aortic valve peak gradient measures  21.2 mmHg. Aortic valve area, by VTI measures 2.56 cm. Pulmonic Valve: Pulmonic valve regurgitation is not visualized. Aorta: There is mild dilatation of the ascending aorta, measuring 37 mm. Venous: The inferior vena cava is normal in size with greater than 50% respiratory variability, suggesting right atrial pressure of 3 mmHg. IAS/Shunts: No atrial level shunt detected by color flow Doppler.  LEFT  VENTRICLE PLAX 2D LVIDd:  4.40 cm   Diastology LVIDs:         3.20 cm   LV e' medial:    6.12 cm/s LV PW:         1.80 cm   LV E/e' medial:  18.2 LV IVS:        1.70 cm   LV e' lateral:   11.90 cm/s LVOT diam:     2.30 cm   LV E/e' lateral: 9.4 LV SV:         102 LV SV Index:   46 LVOT Area:     4.15 cm  RIGHT VENTRICLE RV S prime:     13.70 cm/s TAPSE (M-mode): 1.0 cm LEFT ATRIUM              Index        RIGHT ATRIUM           Index LA diam:        5.00 cm  2.25 cm/m   RA Area:     21.80 cm LA Vol (A2C):   125.0 ml 56.30 ml/m  RA Volume:   62.00 ml  27.93 ml/m LA Vol (A4C):   78.5 ml  35.36 ml/m LA Biplane Vol: 109.0 ml 49.09 ml/m  AORTIC VALVE AV Area (Vmax):    2.11 cm AV Area (Vmean):   1.98 cm AV Area (VTI):     2.56 cm AV Vmax:           230.00 cm/s AV Vmean:          181.000 cm/s AV VTI:            0.398 m AV Peak Grad:      21.2 mmHg AV Mean Grad:      14.0 mmHg LVOT Vmax:         117.00 cm/s LVOT Vmean:        86.200 cm/s LVOT VTI:          0.245 m LVOT/AV VTI ratio: 0.62  AORTA Ao Root diam: 3.40 cm Ao Asc diam:  3.70 cm MITRAL VALVE MV Area (PHT): 3.64 cm     SHUNTS MV Decel Time: 209 msec     Systemic VTI:  0.24 m MR Peak grad: 19.0 mmHg     Systemic Diam: 2.30 cm MR Vmax:      218.00 cm/s MV E velocity: 111.50 cm/s Carolan Clines Electronically signed by Carolan Clines Signature Date/Time: 02/01/2022/4:03:39 PM    Final     Physical Exam  General: Pleasant, chronically ill elderly man laying in bed. No acute distress. CV: Regular rate. Irregular irregular rhythm. II/VI systolic flow murmur. No LE edema Pulmonary: Lungs CTAB. Normal effort. Decreased breath sounds at the bases. No wheezing or rales. Abdominal: Soft, nontender, nondistended. Normal bowel sounds. Extremities: 2+ distal pulses. Normal ROM. Skin: Warm and dry. Chronic bruises around arm and wrist. Neuro: A&Ox3. Moves all extremities. Normal sensation to gross touch.  Psych: Normal mood and  affect   Assessment/Plan: Mason Patton is a 76 y.o. male with hx of aortic stenosis s/p AVR, COPD, OSA on CPAP, afib on Eliquis, T2DM, HTN, frequent urinary infections, and prior hospitalization for MRSE bacteremia, presenting to the ED with generalized weakness, confusion and fever. He was admitted for concern for sepsis and found to have coag negative staph bacteremia currently on vancomycin treatment.  Principal Problem:   Bacteremia Active Problems:   OSA on CPAP   Persistent atrial fibrillation (HCC)   Status  post aortic valve replacement  Coag negative staph bacteremia Patient continues to be stable clinically. He is at baseline mental status. Still with no leukocytosis and has been afebrile for the last 48 hours. Blood culture on 7/27 positive for staph species but still no speciation after 2 days.  Plan to repeat blood cultures today. 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  Repeat echo on 7/28 showed EF 50 to 55%, moderate LVH and stable bioprosthetic. AVR. UOP of 900 cc in the last 24 hours. Kidney function remained stable with no electrolyte abnormalities. -Continue Entresto 49-51 mg BID -Continue spironolactone 12.5 mg daily -Continue Toprol-XL 100 mg daily -Continue torsemide 20 mg daily -Strict I&O's, daily weight -Keep K>4.0, Mag >2.0  A-fib on Eliquis Patient remains in A-fib with stable heart rate in the 70s to 80s. -Continue Toprol XL 100 mg daily -Eliquis 5 mg BID  COPD Chronic and stable. PFT on 09/2020 showed FEV1/FVC of 71. No significant wheezing on exam today. -Start Incruse Ellipta -Continue albuterol neb -Tessalon Perles as needed for cough  T2DM Well-controlled with A1c 5.7% this admission.  Improved from 6.4% 8 months ago. -CTM  OSA Tolerated CPAP well overnight.  -Continue CPAP at night  Hypothyroidism: TSH wnl. Continue Synthroid 200 mcg daily GERD:  Continue pantoprazole 40 mg daily HLD: LDL 46 currently at goal. Continue atorvastatin 40 mg daily  Diet: HH IVF: N/A VTE: Eliquis CODE: DNR  Prior to Admission Living Arrangement: Home Anticipated Discharge Location: Home with home health PT/OT Barriers to Discharge: Medical treatment Dispo: Anticipated discharge in approximately 1-2 day(s).   Signed: Steffanie Rainwater, MD 02/02/2022, 7:29 AM  Pager: [phone] Internal Medicine Teaching Service After 5pm on weekdays and 1pm on weekends: On Call pager: [phone]

## 2022-02-02 NOTE — Progress Notes (Signed)
Occupational Therapy Treatment Patient Details Name: Mason Patton MRN: [id number] DOB: 10-22-1945 Today's Date: 02/02/2022   History of present illness 76 y.o. male presented to ED on 01/31/22 for fever, confusion, and weakness. Pt with recent dental fillings this week. Admitted for SIRS. PMH: aortic stenosis s/p AVR, COPD, OSA on CPAP, afib on Eliquis, T2DM, HTN, frequent urinary infections, and past hospitalizations for bacteremia   OT comments  Pt. Seen for skilled OT session.  Pt. Min guard with ambulation. Able to ambulate to/from b.room and complete simulated toileting task.  Reports having a grab bar installed prior to home.  Also has a lift recliner chair he will sleep in as he continues with HHPT/OT and gains better abilities with bed mobility.  States he also has a son that lives with him that can help as needed.  Wife states she has knee problems and can not assist with mobility but able to do all meals ect.  Alerted otr/l to update d/c plans to [facility] as PT also agrees to [facility] as well.     Recommendations for follow up therapy are one component of a multi-disciplinary discharge planning process, led by the attending physician.  Recommendations may be updated based on patient status, additional functional criteria and insurance authorization.    Follow Up Recommendations  Home health OT    Assistance Recommended at Discharge Frequent or constant Supervision/Assistance  Patient can return home with the following  A lot of help with walking and/or transfers;A lot of help with bathing/dressing/bathroom;Direct supervision/assist for medications management;Direct supervision/assist for financial management   Equipment Recommendations  BSC/3in1    Recommendations for Other Services      Precautions / Restrictions Precautions Precautions: Fall Restrictions Weight Bearing Restrictions: No       Mobility Bed Mobility               General bed mobility comments: in recliner at  beginning and end of session-reports he has a lift recliner he can use to sleep in initially as he gains better ability with bed mobility with HH    Transfers Overall transfer level: Needs assistance Equipment used: Rolling walker (2 wheels) Transfers: Sit to/from Stand, Bed to chair/wheelchair/BSC Sit to Stand: Min guard     Step pivot transfers: Min guard     General transfer comment: moving well with sit/stand from recliner. able to amulate to/from b.room min guard a.     Balance                                           ADL either performed or assessed with clinical judgement   ADL Overall ADL's : Needs assistance/impaired                         Toilet Transfer: Min guard;Ambulation;Rolling walker (2 wheels);Regular Toilet;Grab bars Toilet Transfer Details (indicate cue type and reason): uses reg. height toilet-refuses 3n1 secondary to un comfortable to his "private areas" reviewed elongated option and he declines still but is having grab bar installed in his b.room prior to home         Functional mobility during ADLs: Min guard General ADL Comments: able to sit/stand from recliner min guard a.  ambulation with no lob noted.  greatest limitation is bed mobility but pt. reports he has a lift chair that is also a recliner and  he plans to sleep in that as he gains strength for bed mobility. also reports son lives with them and can assist as needed.    Extremity/Trunk Assessment              Vision       Perception     Praxis      Cognition Arousal/Alertness: Awake/alert Behavior During Therapy: WFL for tasks assessed/performed, Impulsive, Flat affect Overall Cognitive Status: Within Functional Limits for tasks assessed                                          Exercises      Shoulder Instructions       General Comments 76 HR and 94% SpO2 on RA    Pertinent Vitals/ Pain       Pain Assessment Pain  Assessment: No/denies pain  Home Living                                          Prior Functioning/Environment              Frequency  Min 2X/week        Progress Toward Goals  OT Goals(current goals can now be found in the care plan section)  Progress towards OT goals: Progressing toward goals     Plan Discharge plan needs to be updated    Co-evaluation                 AM-PAC OT "6 Clicks" Daily Activity     Outcome Measure   Help from another person eating meals?: None Help from another person taking care of personal grooming?: A Little Help from another person toileting, which includes using toliet, bedpan, or urinal?: A Lot Help from another person bathing (including washing, rinsing, drying)?: A Lot Help from another person to put on and taking off regular upper body clothing?: A Little Help from another person to put on and taking off regular lower body clothing?: A Lot 6 Click Score: 16    End of Session Equipment Utilized During Treatment: Gait belt;Rolling walker (2 wheels)  OT Visit Diagnosis: Unsteadiness on feet (R26.81);Other abnormalities of gait and mobility (R26.89);Other symptoms and signs involving cognitive function;Muscle weakness (generalized) (M62.81)   Activity Tolerance Patient tolerated treatment well   Patient Left in chair;with call bell/phone within reach;with chair alarm set   Nurse Communication          Time: 7681-1572 OT Time Calculation (min): 18 min  Charges: OT General Charges $OT Visit: 1 Visit OT Treatments $Self Care/Home Management : 8-22 mins  Boneta Lucks, COTA/L Acute Rehabilitation [phone]   Salvadore Oxford 02/02/2022, 12:59 PM

## 2022-02-02 NOTE — Plan of Care (Signed)

## 2022-02-02 NOTE — Progress Notes (Addendum)
Physical Therapy Treatment Patient Details Name: Mason Patton MRN: [id number] DOB: [birth year] Today's Date: 02/02/2022   History of Present Illness 76 y.o. male presented to ED on 01/31/22 for fever, confusion, and weakness. Pt with recent dental fillings this week. Admitted for SIRS. PMH: aortic stenosis s/p AVR, COPD, OSA on CPAP, afib on Eliquis, T2DM, HTN, frequent urinary infections, and past hospitalizations for bacteremia    PT Comments    Pt progressing well towards goals. Pt able to increase gait distance to 200 feet using RW. Pt struggling most with bed mobility due to decreased strength in UE's, LE's, and trunk. Pt will continue to benefit from skilled, acute care physical therapy interventions to maximize his current level of function and progress towards established goals.    Recommendations for follow up therapy are one component of a multi-disciplinary discharge planning process, led by the attending physician.  Recommendations may be updated based on patient status, additional functional criteria and insurance authorization.  Follow Up Recommendations  Home health PT     Assistance Recommended at Discharge Frequent or constant Supervision/Assistance  Patient can return home with the following A lot of help with walking and/or transfers;A little help with bathing/dressing/bathroom;Assistance with cooking/housework;Help with stairs or ramp for entrance   Equipment Recommendations  Rolling walker (2 wheels)    Recommendations for Other Services       Precautions / Restrictions Precautions Precautions: Fall Restrictions Weight Bearing Restrictions: No     Mobility  Bed Mobility Overal bed mobility: Needs Assistance Bed Mobility: Supine to Sit     Supine to sit: Mod assist, HOB elevated, +2 for physical assistance     General bed mobility comments: Pt required HHA and sling pad for completion of supine to sit. Pt able to scoot himself with standby assist to  EOB.    Transfers Overall transfer level: Needs assistance Equipment used: Rolling walker (2 wheels) Transfers: Sit to/from Stand Sit to Stand: +2 physical assistance, Min assist (standby A)           General transfer comment: Pt performed initial sit to stand from bed and urine was dripping on floor. Pt demonstrated retropulsion and required assistance to maintain balance before returned to EOB to clean up floor. Pt was able to perform two sit <> stands from chair as well after gait training (one with min assist and one with standby A). Cues provided for rocking strategy.    Ambulation/Gait Ambulation/Gait assistance: +2 safety/equipment, Min guard Gait Distance (Feet): 200 Feet Assistive device: Rolling walker (2 wheels) Gait Pattern/deviations: Step-through pattern, Decreased stride length, Decreased step length - left, Decreased step length - right Gait velocity: decreased Gait velocity interpretation: 1.31 - 2.62 ft/sec, indicative of limited community ambulator   General Gait Details: Pt required cues to increase step length due to short, shuffling steps but pt only able to improve upon for short duration. No LOB occurred. HR WNL. Chair followed pt but was not needed.   Stairs             Wheelchair Mobility    Modified Rankin (Stroke Patients Only)       Balance Overall balance assessment: Needs assistance Sitting-balance support: Bilateral upper extremity supported, Feet supported Sitting balance-Leahy Scale: Fair     Standing balance support: Bilateral upper extremity supported, During functional activity Standing balance-Leahy Scale: Poor  Cognition Arousal/Alertness: Awake/alert Behavior During Therapy: WFL for tasks assessed/performed, Impulsive, Flat affect Overall Cognitive Status: Impaired/Different from baseline Area of Impairment: Awareness, Safety/judgement, Problem solving                          Safety/Judgement: Decreased awareness of safety, Decreased awareness of deficits Awareness: Emergent Problem Solving: Slow processing, Difficulty sequencing, Requires verbal cues General Comments: Pt A and O x 4        Exercises General Exercises - Lower Extremity Ankle Circles/Pumps: Both, 15 reps, Seated (heel/toe raises) Hip ABduction/ADduction: Right, Left, 10 reps, Supine Straight Leg Raises: Right, Left, 10 reps, Supine (minimal ROM) Hip Flexion/Marching: Right, Left, 10 reps, Seated (heel to opposite shin/knee area)    General Comments General comments (skin integrity, edema, etc.): 76 HR and 94% SpO2 on RA      Pertinent Vitals/Pain Pain Assessment Pain Assessment: No/denies pain    Home Living                          Prior Function            PT Goals (current goals can now be found in the care plan section) Acute Rehab PT Goals Patient Stated Goal: to get stronger PT Goal Formulation: With patient Time For Goal Achievement: 02/15/22 Potential to Achieve Goals: Good Progress towards PT goals: Progressing toward goals    Frequency    Min 3X/week      PT Plan Current plan remains appropriate    Co-evaluation              AM-PAC PT "6 Clicks" Mobility   Outcome Measure  Help needed turning from your back to your side while in a flat bed without using bedrails?: A Lot Help needed moving from lying on your back to sitting on the side of a flat bed without using bedrails?: A Lot Help needed moving to and from a bed to a chair (including a wheelchair)?: A Lot Help needed standing up from a chair using your arms (e.g., wheelchair or bedside chair)?: A Little Help needed to walk in hospital room?: A Little Help needed climbing 3-5 steps with a railing? : A Little 6 Click Score: 15    End of Session Equipment Utilized During Treatment: Gait belt Activity Tolerance: Patient tolerated treatment well Patient left: with call  bell/phone within reach;in chair;with chair alarm set Nurse Communication: Mobility status PT Visit Diagnosis: Unsteadiness on feet (R26.81);Muscle weakness (generalized) (M62.81);Other abnormalities of gait and mobility (R26.89)     Time: 5176-1607 PT Time Calculation (min) (ACUTE ONLY): 24 min  Charges:  $Gait Training: 8-22 mins $Therapeutic Exercise: 8-22 mins                     Tana Coast, PT    [employer] 02/02/2022, 10:18 AM

## 2022-02-03 DIAGNOSIS — R7881 Bacteremia: Secondary | ICD-10-CM | POA: Diagnosis not present

## 2022-02-03 LAB — BASIC METABOLIC PANEL
Anion gap: 11 (ref 5–15)
BUN: 19 mg/dL (ref 8–23)
CO2: 22 mmol/L (ref 22–32)
Calcium: 8.8 mg/dL — ABNORMAL LOW (ref 8.9–10.3)
Chloride: 105 mmol/L (ref 98–111)
Creatinine, Ser: 0.98 mg/dL (ref 0.61–1.24)
GFR, Estimated: >60 mL/min (ref >60)
Glucose, Bld: 153 mg/dL — ABNORMAL HIGH (ref 70–99)
Potassium: 4.5 mmol/L (ref 3.5–5.1)
Sodium: 138 mmol/L (ref 135–145)

## 2022-02-03 LAB — CBC
HCT: 42.6 % (ref 39.0–52.0)
Hemoglobin: 14.6 g/dL (ref 13.0–17.0)
MCH: 31.4 pg (ref 26.0–34.0)
MCHC: 34.3 g/dL (ref 30.0–36.0)
MCV: 91.6 fL (ref 80.0–100.0)
Platelets: 149 10*3/uL — ABNORMAL LOW (ref 150–400)
RBC: 4.65 MIL/uL (ref 4.22–5.81)
RDW: 12.6 % (ref 11.5–15.5)
WBC: 11 10*3/uL — ABNORMAL HIGH (ref 4.0–10.5)
nRBC: 0 % (ref 0.0–0.2)

## 2022-02-03 LAB — CULTURE, BLOOD (ROUTINE X 2): Special Requests: ADEQUATE

## 2022-02-03 MED ORDER — SPIRIVA RESPIMAT 2.5 MCG/ACT IN AERS: 2.0000 | INHALATION_SPRAY | Freq: Every day | RESPIRATORY_TRACT | 2 refills | Status: DC

## 2022-02-03 MED ORDER — AMOXICILLIN 500 MG PO TABS: 500.0000 mg | ORAL_TABLET | Freq: Two times a day (BID) | ORAL | 0 refills | Status: DC

## 2022-02-03 NOTE — Discharge Summary (Addendum)
Name: Mason Patton MRN: [id number] DOB: 1945-07-10 76 y.o. PCP: Ephriam Jenkins, MD  Date of Admission: 01/31/2022  8:33 AM Date of Discharge:  02/03/2022 Attending Physician: Dr.  Saverio Danker  Discharge Diagnosis: Principal Problem:   Bacteremia Active Problems:   OSA on CPAP   Persistent atrial fibrillation (HCC)   Status post aortic valve replacement    Discharge Medications: Allergies as of 02/03/2022       Reactions   Codeine Nausea And Vomiting   Demerol Nausea And Vomiting        Medication List     STOP taking these medications    amoxicillin 500 MG capsule Commonly known as: AMOXIL Replaced by: amoxicillin 500 MG tablet   ferrous sulfate 325 (65 FE) MG tablet   glipiZIDE 10 MG tablet Commonly known as: GLUCOTROL   HYDROcodone-acetaminophen 5-325 MG tablet Commonly known as: NORCO/VICODIN       TAKE these medications    acetaminophen 325 MG tablet Commonly known as: TYLENOL Take 2 tablets (650 mg total) by mouth every 4 (four) hours as needed for headache or mild pain.   amoxicillin 500 MG tablet Commonly known as: AMOXIL Take 1 tablet (500 mg total) by mouth 2 (two) times daily for 3 days. Start taking on: February 04, 2022 Replaces: amoxicillin 500 MG capsule   apixaban 5 MG Tabs tablet Commonly known as: ELIQUIS Take 5 mg by mouth 2 (two) times daily.   aspirin EC 81 MG tablet Take 1 tablet (81 mg total) by mouth daily. Swallow whole.   atorvastatin 40 MG tablet Commonly known as: LIPITOR Take 40 mg by mouth daily in the afternoon.   benzonatate 100 MG capsule Commonly known as: TESSALON Take 100 mg by mouth 3 (three) times daily as needed for cough.   Centrum Silver 50+Men Tabs Take 1 tablet by mouth daily with breakfast. What changed: Another medication with the same name was removed. Continue taking this medication, and follow the directions you see here.   cholecalciferol 25 MCG (1000 UNIT) tablet Commonly known as: VITAMIN  D3 Take 1,000 Units by mouth daily.   empagliflozin 10 MG Tabs tablet Commonly known as: Jardiance Take 1 tablet (10 mg total) by mouth daily before breakfast.   Entresto 49-51 MG Generic drug: sacubitril-valsartan Take 1 tablet by mouth 2 (two) times daily.   glucose blood test strip Commonly known as: OneTouch Verio Use to test blood sugar 2-3 times daily as instructed. Dx: E11.9   levothyroxine 200 MCG tablet Commonly known as: Synthroid Take 1 tablet (200 mcg total) by mouth daily.   metFORMIN 500 MG tablet Commonly known as: GLUCOPHAGE Take 1,000 mg by mouth 2 (two) times daily with a meal.   metoprolol succinate 100 MG 24 hr tablet Commonly known as: TOPROL-XL Take 1 tablet (100 mg total) by mouth daily. Take with or immediately following a meal.   nitroGLYCERIN 0.4 MG SL tablet Commonly known as: NITROSTAT Place 0.4 mg under the tongue every 5 (five) minutes as needed for chest pain.   nortriptyline 10 MG capsule Commonly known as: PAMELOR Take 10 mg by mouth See admin instructions. Take 10 mg by mouth 30-45 minutes prior to bedtime for overactive intestinal muscles   omeprazole 20 MG capsule Commonly known as: PRILOSEC Take 20 mg by mouth daily before breakfast.   OneTouch Delica Lancets Fine Misc Use to test blood sugar 2-3 times daily as instructed. Dx: E11.9   OneTouch Verio Flex System w/Device Kit 1 each  by Does not apply route daily. Dx: E11.9   Spiriva Respimat 2.5 MCG/ACT Aers Generic drug: Tiotropium Bromide Monohydrate Inhale 2 puffs into the lungs daily.   spironolactone 25 MG tablet Commonly known as: ALDACTONE Take 0.5 tablets (12.5 mg total) by mouth daily.   torsemide 20 MG tablet Commonly known as: DEMADEX Take 20 mg by mouth See admin instructions. Take 20 mg by mouth in the morning and may take an additional 20 mg once a day as directed for a period of 3 days, for an overnight weight gain of 5 pounds   Ventolin HFA 108 (90 Base)  MCG/ACT inhaler Generic drug: albuterol Inhale 1-2 puffs into the lungs every 4 (four) hours as needed for wheezing or shortness of breath.   Vitamin B12 500 MCG Tabs Take 500 mcg by mouth 2 (two) times daily.               Durable Medical Equipment  (From admission, onward)           Start     Ordered   02/02/22 1417  For home use only DME Walker rolling  Once       Question Answer Comment  Walker: With 5 Inch Wheels   Patient needs a walker to treat with the following condition Physical deconditioning   Patient needs a walker to treat with the following condition COPD (chronic obstructive pulmonary disease) ([place])   Patient needs a walker to treat with the following condition Aortic stenosis      02/02/22 1417            Disposition and follow-up:   Mason Patton was discharged from [facility] in Stable condition.  At the hospital follow up visit please address:  1.  Follow-up:  a.  Bacteremia: Blood culture grew Staph hominis. Treated for bacteremia in the setting of recent oral manipulations, bioprosthetic aortic valve and history of MRSE. Infectious disease thought this was likely a contaminant however given risk factors and predisposition, patient discharged home on 3 days of amoxicillin to complete 7-day course of antibiotics.  Reassess for any further fevers or change in mental status.    b.  Heart failure: Remained euvolemic throughout hospitalization on home regimen. Repeat echo did not show any vegetation or changes from prior echo. Jardiance held during hospitalization but resumed at discharge. Reevaluate his fluid status and adjust meds as appropriate.    c.  COPD/OSA: No evidence of COPD exacerbation on exam. Continued nighttime CPAP during hospitalization. Started patient on Spiriva daily inhaler. Reassess his respiratory status.   d.  A-fib: Remain in A-fib with stable heart rate throughout hospitalization.  2.  Labs / imaging  needed at time of follow-up: CBC  3.  Pending labs/ test needing follow-up: 7/29 blood cultures  Follow-up Appointments:  Follow-up Information     Ephriam Jenkins, MD. Call in 1 week(s).   Specialty: Internal Medicine Why: Please call to make a 1 week hospital follow-up appointment. Contact information: [address] [address] [phone]         Freada Bergeron, MD .   Specialties: Cardiology, Radiology Contact information: [phone] [place] [address] [place] [facility] Course by problem list: Mason Patton is a 76 y.o. male with PMH of aortic stenosis s/p AVR, COPD, OSA on CPAP, afib on Eliquis, T2DM, HTN,  frequent urinary infections, and hospitalization for MRSE bacteremia in 2022, who presented to the hospital with generalized weakness, confusion and fever and was admitted for sepsis concern. He was hemodynamically stable following admission and blood cultures subsequently grew coag negative staph, which was initially treated as bacteremia.   Coag negative staph bacteremia versus blood Cx contaminant Patient was febrile to 38.7C on arrival to the ED without leukocytosis and his wife reported that he was acutely altered at home in the setting of recent dental filling procedures. Patient received fluid bolus and was started on ceftriaxone. UA and CXR were unremarkable. MRSA PCR was negative. Given patient's prior hospitalization on 11/09/20 for methicillin resistant staphylococcus epidermidis, vancomycin was added to treatment regimen and ceftriaxone was discontinued. Blood Cx grew Staphyloccoccus hominis in both aerobic and anaerobic bottles from left antecubital site only. TTE was completed and showed no evidence of endocarditis. ID was consulted and they suggested possibility of blood culture contamination. Patient was discharged after 4 days of vancomycin treatment with 3 days of oral  amoxicillin 500 mg to complete 7 day antibiotics course for possible oral source of infection.  Heart failure with improved ejection fraction Aortic stenosis s/p AVR Previous echo prior to admission on 11/16/2020 showed EF of 60-65%.  Repeat echo on admission showed EF of 50-55%, moderate LVH, and stable bioprosthetic with AVR.  Patient had good urinary output and kidney function remained nominal this admission.  Repeat BMPs were unremarkable. Patient continued on GDMT (Entresto 49-51 mg BID, spironolactone 12.5 mg daily, and metoprolol succinate 100 mg daily) as well as torsemide 20 mg daily. Patient's empagliflozin 10 mg daily was held due to concern for possible UTI in setting of acute infectious illness and was restarted on discharge.  Atrial fibrillation on Eliquis Patient in afib on admission and remained in stable afib without RVR with HR in 70-90 range throughout hospitalization. Continued on home Eliquis 5 mg BID, which he consistently takes at home.   COPD Patient's chronic COPD was stable this admission with no oxygen requirement. He did report chronic cough on admission. PFTs on 09/2020 showed FEV1/FVC of 71. Patient treated with albuterol nebulizer Q4 PRN and home benzonatate 100 mg TID PRN for cough. Started Incruse Ellipta prior to discharge. Discussed smoking cessation (60 pack-year history) and patient declined treatment at this time. Discharged home on Spiriva inhaler.   OSA Patient uses CPAP consistently at home for chronic OSA.  He tolerated CPAP well in the hospital and repeatedly requested daytime CPAP due to personal preference while resting in bed this admission.   T2DM A1c was well-controlled at 5.7% this admission, improved from 6.4% on 05/18/2021. Home empagliflozin and metformin were held this admission and restarted on discharge. Home glipizide was discontinued.   Hypothyroidism TSH was WNL at 0.446 this admission, improved from low of 0.185 on 11/05/2020. Patient  maintained on levothyroxine 200 mcg daily this admission.   HLD Patient's LDL of 46 found to be at goal this admission. He was continued on atorvastatin 40 mg daily.  GERD Patient did not complain of reflux symptoms this hospitalization. Maintained on home pantoprazole 40 mg this admission.   Subjective This morning, the patient reports feeling better than he did on admission.  He reports feeling stronger and being able to ambulate to the restroom which he previously could not do.  He has been eating well without nausea or vomiting.  He feels happy with his improvement.  He denies new chills, shakes, shortness of breath, dysuria, diarrhea, chest  pain, headache, dizziness, or confusion. He had no acute events overnight.  Discharge Vitals:   BP 136/82 (BP Location: Right Arm)   Pulse 91   Temp 98.2 F (36.8 C) (Oral)   Resp 18   Ht '5\' 10"'  (1.778 m)   Wt 104.9 kg   SpO2 95%   BMI 33.18 kg/m  Discharge exam: General:  Resting in bed comfortably, in no acute distress. Cardiovascular:  Irregularly irregular HR. II/VI systolic flow murmur. No rubs or gallops. 2+ radial and pedal pulses bilaterally. Respiratory:  Normal WOB on room air. Lungs CTAB without wheezes/rales/crackles. Decreased breath sounds at lung bases bilaterally. Abdominal:  Normoactive bowel sounds. Soft and nondistended. Extremities:  Normal ROM in all extremities. No cyanosis, clubbing, or edema. Skin:  Warm and dry. Light ecchymoses on wrists and arms bilaterally. No obvious rashes. Psych:  Pleasant and appropriate. Full range affect and stable mood.    Pertinent Labs, Studies, and Procedures:     Latest Ref Rng & Units 02/03/2022    1:29 AM 02/02/2022   12:52 AM 02/01/2022    4:04 AM  CBC  WBC 4.0 - 10.5 K/uL 11.0  8.2  9.5   Hemoglobin 13.0 - 17.0 g/dL 14.6  14.6  13.8   Hematocrit 39.0 - 52.0 % 42.6  42.3  39.9   Platelets 150 - 400 K/uL 149  147  148        Latest Ref Rng & Units 02/03/2022    1:29 AM  02/02/2022   12:52 AM 02/01/2022    4:04 AM  CMP  Glucose 70 - 99 mg/dL 153  156  113   BUN 8 - 23 mg/dL '19  17  14   ' Creatinine 0.61 - 1.24 mg/dL 0.98  1.07  0.86   Sodium 135 - 145 mmol/L 138  138  134   Potassium 3.5 - 5.1 mmol/L 4.5  3.8  3.5   Chloride 98 - 111 mmol/L 105  103  100   CO2 22 - 32 mmol/L '22  26  24   ' Calcium 8.9 - 10.3 mg/dL 8.8  8.8  8.6     ECHOCARDIOGRAM COMPLETE  Result Date: 02/01/2022    ECHOCARDIOGRAM REPORT   [name] Date of Exam: 02/01/2022 Medical Rec #:  [id number]        Height:       70.0 in Accession #:    [id number]       Weight:       231.3 lb Date of Birth:  1946-02-20        BSA:          2.220 m Patient Age:    4 years         BP:           119/79 mmHg Patient Gender: M                HR:           68 bpm. Exam Location:  Inpatient Procedure: 2D Echo, Cardiac Doppler, Color Doppler and Intracardiac            Opacification Agent Indications:    Bacteremia  History:        Patient has prior history of Echocardiogram examinations, most                 recent 11/16/2020. CAD, Prior CABG, Carotid Disease, Aortic Valve  Disease, Arrythmias:Atrial Fibrillation, Signs/Symptoms:Murmur;                 Risk Factors:Diabetes, Hypertension, Former Smoker, Sleep Apnea                 and Dyslipidemia. Edwards bioprosthetic AVR 75m, emphysema.  Sonographer:    SEartha InchReferring Phys: [id number] Arizona Eye AssociatesVINCENT  Sonographer Comments: Patient is morbidly obese. IMPRESSIONS  1. Left ventricular ejection fraction, by estimation, is 50 to 55%. The left ventricle has low normal function. The left ventricle has no regional wall motion abnormalities. There is moderate left ventricular hypertrophy. Left ventricular diastolic parameters are indeterminate.  2. Right ventricular systolic function was not well visualized. The right ventricular size is not well visualized. Tricuspid regurgitation signal is inadequate for assessing PA  pressure.  3. Left atrial size was mild to moderately dilated.  4. No evidence of mitral valve regurgitation. Moderate mitral annular calcification.  5. S/p bioprosthetic AVR. No significant PVL. V max 2.3 m/s. Mean gradient 14 mmHg. DI 0.62. Normal bioprosthesis. Aortic valve regurgitation is not visualized.  6. There is mild dilatation of the ascending aorta, measuring 37 mm.  7. The inferior vena cava is normal in size with greater than 50% respiratory variability, suggesting right atrial pressure of 3 mmHg. Comparison(s): No significant change from prior study. FINDINGS  Left Ventricle: Left ventricular ejection fraction, by estimation, is 50 to 55%. The left ventricle has low normal function. The left ventricle has no regional wall motion abnormalities. Definity contrast agent was given IV to delineate the left ventricular endocardial borders. The left ventricular internal cavity size was normal in size. There is moderate left ventricular hypertrophy. Left ventricular diastolic parameters are indeterminate. Right Ventricle: The right ventricular size is not well visualized. Right ventricular systolic function was not well visualized. Tricuspid regurgitation signal is inadequate for assessing PA pressure. Left Atrium: Left atrial size was mild to moderately dilated. Right Atrium: Right atrial size was normal in size. Pericardium: Trivial pericardial effusion is present. Mitral Valve: Moderate mitral annular calcification. No evidence of mitral valve regurgitation. Tricuspid Valve: Tricuspid valve regurgitation is not demonstrated. Aortic Valve: S/p bioprosthetic AVR. No significant PVL. V max 2.3 m/s. Mean gradient 14 mmHg. DI 0.62. Normal bioprosthesis. Aortic valve regurgitation is not visualized. Aortic valve mean gradient measures 14.0 mmHg. Aortic valve peak gradient measures  21.2 mmHg. Aortic valve area, by VTI measures 2.56 cm. Pulmonic Valve: Pulmonic valve regurgitation is not visualized. Aorta: There  is mild dilatation of the ascending aorta, measuring 37 mm. Venous: The inferior vena cava is normal in size with greater than 50% respiratory variability, suggesting right atrial pressure of 3 mmHg. IAS/Shunts: No atrial level shunt detected by color flow Doppler.  LEFT VENTRICLE PLAX 2D LVIDd:         4.40 cm   Diastology LVIDs:         3.20 cm   LV e' medial:    6.12 cm/s LV PW:         1.80 cm   LV E/e' medial:  18.2 LV IVS:        1.70 cm   LV e' lateral:   11.90 cm/s LVOT diam:     2.30 cm   LV E/e' lateral: 9.4 LV SV:         102 LV SV Index:   46 LVOT Area:     4.15 cm  RIGHT VENTRICLE RV S prime:     13.70 cm/s TAPSE (  M-mode): 1.0 cm LEFT ATRIUM              Index        RIGHT ATRIUM           Index LA diam:        5.00 cm  2.25 cm/m   RA Area:     21.80 cm LA Vol (A2C):   125.0 ml 56.30 ml/m  RA Volume:   62.00 ml  27.93 ml/m LA Vol (A4C):   78.5 ml  35.36 ml/m LA Biplane Vol: 109.0 ml 49.09 ml/m  AORTIC VALVE AV Area (Vmax):    2.11 cm AV Area (Vmean):   1.98 cm AV Area (VTI):     2.56 cm AV Vmax:           230.00 cm/s AV Vmean:          181.000 cm/s AV VTI:            0.398 m AV Peak Grad:      21.2 mmHg AV Mean Grad:      14.0 mmHg LVOT Vmax:         117.00 cm/s LVOT Vmean:        86.200 cm/s LVOT VTI:          0.245 m LVOT/AV VTI ratio: 0.62  AORTA Ao Root diam: 3.40 cm Ao Asc diam:  3.70 cm MITRAL VALVE MV Area (PHT): 3.64 cm     SHUNTS MV Decel Time: 209 msec     Systemic VTI:  0.24 m MR Peak grad: 19.0 mmHg     Systemic Diam: 2.30 cm MR Vmax:      218.00 cm/s MV E velocity: 111.50 cm/s Phineas Inches Electronically signed by Phineas Inches Signature Date/Time: 02/01/2022/4:03:39 PM    Final    DG Chest Port 1 View  Result Date: 01/31/2022 CLINICAL DATA:  Fever question sepsis EXAM: PORTABLE CHEST 1 VIEW COMPARISON:  Portable exam 0900 hours compared to 05/18/2021 FINDINGS: 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 Mediastinal contours and pulmonary vascularity normal.  Atherosclerotic calcification aorta. Increased markings at RIGHT base favor atelectasis over infiltrate. Remaining lungs clear. No pleural effusion or pneumothorax. IMPRESSION: Increased markings RIGHT base, favor atelectasis over infiltrate. No additional abnormalities. Aortic Atherosclerosis (ICD10-I70.0). Electronically Signed   By: Lavonia Dana M.D.   On: 01/31/2022 09:08     Discharge Instructions: Discharge Instructions     Call MD for:  difficulty breathing, headache or visual disturbances   Complete by: As directed    Call MD for:  extreme fatigue   Complete by: As directed    Diet - low sodium heart healthy   Complete by: As directed    Increase activity slowly   Complete by: As directed          Discharge Instructions      Mason Patton,  It was a pleasure taking care of you at [facility] were admitted for fever, generalized weakness and some confusion. We treated you with IV antibiotics due to concern for a bloodstream infection. We are discharging you home now that you are doing better. Please follow the following instructions.   1) Take amoxicillin 500 mg twice daily for 3 days 2) Start Spiriva inhaler 2 puffs daily to help with your COPD 3) Call your primary care doctor for a 1 week hospital follow-up  Take care,  Dr. Linwood Dibbles, MD, MPH     Signed: Linwood Dibbles  M, MD 02/03/2022, 1:33 PM   Pager: [phone]

## 2022-02-03 NOTE — TOC Transition Note (Signed)
Transition of Care [facility]) - CM/SW Discharge Note   Patient Details  Name: Mason Patton MRN: [id number] Date of Birth: 04/29/1946  Transition of Care [facility] CM/SW Contact:  Bess Kinds, RN Phone Number: [phone] 02/03/2022, 2:13 PM   Clinical Narrative:     Spoke with patient on hospital room phone to discuss post acute transition. Advised of recommendations for RW - patient stated that he already has a walker. Further discussed [facility] PT/OT recommendations - agreeable. Discussed agency choice. Referral accepted by [facility]. Spouse to provide transportation home. No further TOC needs identified at this time,   Final next level of care: Home w Home Health Services Barriers to Discharge: No Barriers Identified   Patient Goals and CMS Choice Patient states their goals for this hospitalization and ongoing recovery are:: home with wife CMS Medicare.gov Compare Post Acute Care list provided to:: Patient Choice offered to / list presented to : Patient  Discharge Placement                       Discharge Plan and Services                DME Arranged: N/A DME Agency: NA       HH Arranged: PT, OT HH Agency: [facility] Date [facility] Agency Contacted: 02/03/22 Time HH Agency Contacted: 1413 Representative spoke with at [facility] Agency: Kandee Keen  Social Determinants of Health (SDOH) Interventions     Readmission Risk Interventions    11/10/2020    2:51 PM 11/09/2020    1:22 PM 11/06/2020    4:28 PM  Readmission Risk Prevention Plan  Transportation Screening  Complete Complete  PCP or Specialist Appt within 3-5 Days Complete  Complete  HRI or Home Care Consult  Complete Complete  Social Work Consult for Recovery Care Planning/Counseling  Complete Complete  Palliative Care Screening  Not Applicable Not Applicable  Medication Review Oceanographer)   Complete

## 2022-02-03 NOTE — Hospital Course (Addendum)
Mason Patton is a 76 y.o. male with PMH of aortic stenosis s/p AVR, COPD, OSA on CPAP, afib on Eliquis, T2DM, HTN, frequent urinary infections, and hospitalization for MRSE bacteremia in 2022, who presented to the hospital with generalized weakness, confusion and fever and was admitted for sepsis concern. He was hemodynamically stable following admission and blood cultures subsequently grew coag negative staph, which was initially treated as bacteremia.   Coag negative staph bacteremia versus blood Cx contaminant Patient was febrile to 38.7C on arrival to the ED without leukocytosis and his wife reported that he was acutely altered at home in the setting of recent dental filling procedures.  Patient received fluid bolus and was started on ceftriaxone.  UA and CXR were unremarkable.  MRSA PCR was negative.  Given patient's prior hospitalization on 11/09/20 for methicillin resistant staphylococcus epidermidis, vancomycin was added to treatment regimen and ceftriaxone was discontinued.  Blood Cx grew Staphyloccoccus hominis in both aerobic and anaerobic bottles from left antecubital site only.  TTE was completed and showed no evidence of endocarditis. ID was consulted and they suggested possibility of blood culture contamination.  Patient was discharged after 4 days of vancomycin treatment with 3 days of oral amoxicillin 500 mg to complete 7 day antibiotics course for possible oral source of infection.  Heart failure with improved ejection fraction Aortic stenosis s/p AVR Previous echo prior to admission on 11/16/2020 showed EF of 60-65%.  Repeat echo on admission showed EF of 50-55%, moderate LVH, and stable bioprosthetic with AVR.  Patient had good urinary output and kidney function remained nominal this admission.  Repeat BMPs were unremarkable.  Patient continued on GDMT (Entresto 49-51 mg BID, spironolactone 12.5 mg daily, and metoprolol succinate 100 mg daily) as well as torsemide 20 mg daily.  Patient's  empagliflozin 10 mg daily was held due to concern for possible UTI in setting of acute infectious illness and was restarted on discharge.  Atrial fibrillation on Eliquis Patient in afib on admission and remained in stable afib without RVR with HR in 70-90 range throughout hospitalization.  Continued on home Eliquis 5 mg BID, which he consistently takes at home.   COPD Patient's chronic COPD was stable this admission with no oxygen requirement.  He did report chronic cough on admission.  PFTs on 09/2020 showed FEV1/FVC of 71.  Patient treated with albuterol nebulizer Q4 PRN and home benzonatate 100 mg TID PRN for cough.  Started Incruse Ellipta prior to discharge.  Discussed smoking cessation (60 pack-year history) and patient declined treatment at this time.   OSA Patient uses CPAP consistently at home for chronic OSA.  He tolerated CPAP well in the hospital and repeatedly requested daytime CPAP due to personal preference while resting in bed this admission.   T2DM A1c was well-controlled at 5.7% this admission, improved from 6.4% on 05/18/2021. Home empagliflozin and metformin were held this admission and restarted on discharge.   Hypothyroidism TSH was WNL at 0.446 this admission, improved from low of 0.185 on 11/05/2020. Patient maintained on levothyroxine 200 mcg daily this admission.   HLD Patient's LDL of 46 found to be at goal this admission. He was continued on atorvastatin 40 mg daily.  GERD Patient did not complain of reflux symptoms this hospitalization. Maintained on home pantoprazole 40 mg this admission.  Subjective This morning, the patient reports feeling better than he did on admission.  He reports feeling stronger and being able to ambulate to the restroom which she previously could not do.  He has been eating well without nausea or vomiting.  He feels happy with his improvement.  He denies new chills, shakes, shortness of breath, dysuria, diarrhea, chest pain, headache,  dizziness, or confusion. He had no acute events overnight.  Physical exam General:  Resting in bed comfortably, in no acute distress. Cardiovascular:  Irregularly irregular HR. II/VI systolic flow murmur. No rubs or gallops. 2+ radial and pedal pulses bilaterally. Respiratory:  Normal WOB on room air. Lungs CTAB without wheezes/rales/crackles. Decreased breath sounds at lung bases bilaterally. Abdominal:  Normoactive bowel sounds. Soft and nondistended. Extremities:  Normal ROM in all extremities. No cyanosis, clubbing, or edema. Skin:  Warm and dry. Light ecchymoses on wrists and arms bilaterally. No obvious rashes. Psych:  Pleasant and appropriate. Full range affect and stable mood.

## 2022-02-03 NOTE — Progress Notes (Signed)
Pt discharged home in stable condition 

## 2022-02-03 NOTE — Plan of Care (Signed)

## 2022-02-03 NOTE — Discharge Instructions (Signed)
[name],  It was a pleasure taking care of you at [facility]. You were admitted for fever, generalized weakness and some confusion. We treated you with IV antibiotics due to concern for a bloodstream infection. We are discharging you home now that you are doing better. Please follow the following instructions.   1) Take amoxicillin 500 mg twice daily for 3 days 2) Start Spiriva inhaler 2 puffs daily to help with your COPD 3) Call your primary care doctor for a 1 week hospital follow-up  Take care,  Dr. Sharrell Ku, MD, MPH

## 2022-02-05 ENCOUNTER — Encounter (HOSPITAL_COMMUNITY): Payer: Self-pay | Admitting: Emergency Medicine

## 2022-02-05 ENCOUNTER — Emergency Department (HOSPITAL_COMMUNITY): Payer: No Typology Code available for payment source

## 2022-02-05 ENCOUNTER — Other Ambulatory Visit: Payer: Self-pay

## 2022-02-05 ENCOUNTER — Observation Stay (HOSPITAL_COMMUNITY)
Admission: EM | Admit: 2022-02-05 | Discharge: 2022-02-06 | Disposition: A | Discharge status: Home / Self Care | Payer: No Typology Code available for payment source | Attending: Internal Medicine | Admitting: Internal Medicine

## 2022-02-05 DIAGNOSIS — Z7984 Long term (current) use of oral hypoglycemic drugs: Secondary | ICD-10-CM | POA: Insufficient documentation

## 2022-02-05 DIAGNOSIS — R5383 Other fatigue: Secondary | ICD-10-CM | POA: Insufficient documentation

## 2022-02-05 DIAGNOSIS — R509 Fever, unspecified: Principal | ICD-10-CM | POA: Insufficient documentation

## 2022-02-05 DIAGNOSIS — R5381 Other malaise: Secondary | ICD-10-CM | POA: Diagnosis not present

## 2022-02-05 DIAGNOSIS — B370 Candidal stomatitis: Secondary | ICD-10-CM | POA: Diagnosis not present

## 2022-02-05 DIAGNOSIS — J449 Chronic obstructive pulmonary disease, unspecified: Secondary | ICD-10-CM | POA: Insufficient documentation

## 2022-02-05 DIAGNOSIS — Z79899 Other long term (current) drug therapy: Secondary | ICD-10-CM | POA: Diagnosis not present

## 2022-02-05 DIAGNOSIS — F17210 Nicotine dependence, cigarettes, uncomplicated: Secondary | ICD-10-CM | POA: Diagnosis not present

## 2022-02-05 DIAGNOSIS — I110 Hypertensive heart disease with heart failure: Secondary | ICD-10-CM | POA: Diagnosis not present

## 2022-02-05 DIAGNOSIS — N390 Urinary tract infection, site not specified: Secondary | ICD-10-CM | POA: Diagnosis not present

## 2022-02-05 DIAGNOSIS — E119 Type 2 diabetes mellitus without complications: Secondary | ICD-10-CM | POA: Diagnosis not present

## 2022-02-05 DIAGNOSIS — Z8673 Personal history of transient ischemic attack (TIA), and cerebral infarction without residual deficits: Secondary | ICD-10-CM | POA: Diagnosis not present

## 2022-02-05 DIAGNOSIS — Z951 Presence of aortocoronary bypass graft: Secondary | ICD-10-CM | POA: Diagnosis not present

## 2022-02-05 DIAGNOSIS — I350 Nonrheumatic aortic (valve) stenosis: Secondary | ICD-10-CM | POA: Insufficient documentation

## 2022-02-05 DIAGNOSIS — I4891 Unspecified atrial fibrillation: Secondary | ICD-10-CM | POA: Insufficient documentation

## 2022-02-05 DIAGNOSIS — Z20822 Contact with and (suspected) exposure to covid-19: Secondary | ICD-10-CM | POA: Diagnosis not present

## 2022-02-05 DIAGNOSIS — R531 Weakness: Secondary | ICD-10-CM | POA: Diagnosis present

## 2022-02-05 DIAGNOSIS — Z7901 Long term (current) use of anticoagulants: Secondary | ICD-10-CM | POA: Insufficient documentation

## 2022-02-05 DIAGNOSIS — Z96653 Presence of artificial knee joint, bilateral: Secondary | ICD-10-CM | POA: Insufficient documentation

## 2022-02-05 DIAGNOSIS — I2510 Atherosclerotic heart disease of native coronary artery without angina pectoris: Secondary | ICD-10-CM | POA: Diagnosis not present

## 2022-02-05 DIAGNOSIS — Z7982 Long term (current) use of aspirin: Secondary | ICD-10-CM | POA: Insufficient documentation

## 2022-02-05 DIAGNOSIS — E039 Hypothyroidism, unspecified: Secondary | ICD-10-CM | POA: Diagnosis not present

## 2022-02-05 DIAGNOSIS — I5030 Unspecified diastolic (congestive) heart failure: Secondary | ICD-10-CM | POA: Insufficient documentation

## 2022-02-05 LAB — CULTURE, BLOOD (ROUTINE X 2): Culture: NO GROWTH

## 2022-02-05 NOTE — ED Triage Notes (Signed)
Pt brought to ED with c/o weakness following previous discharge for the same.   EMS Vitals BP 133/70 HR 85 SPO2 95% RA CBG 138

## 2022-02-05 NOTE — ED Provider Notes (Signed)
[place] EMERGENCY DEPARTMENT Provider Note   CSN: [id number] Arrival date & time: 02/05/22  2236     History {Add pertinent medical, surgical, social history, OB history to HPI:1} Chief Complaint  Patient presents with   Weakness    Mason Patton is a 76 y.o. male.  HPI     76yo male with history of AS/AVR, atrial fibrillation on eliquis, hypertension, hyperlipidemia, hypothyroidism, CVA, COPD, DM, admission for bacteremia with staph hominis (possible contaminent but given recent oral manipulations and bioprosthetic aortic valve treated with IV antibiotics, plan for 3 days of amoxicillin to complete 7 days)  Past Medical History:  Diagnosis Date   Aortic stenosis    09/30/19 echo ([facility], Cardiologist Dr. 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" (05/19/2013)   Meningoencephalitis    Obesity    OSA on CPAP    last sleep study >8 yrs   Pneumonia    "twice" (05/19/2013)   Shortness of breath    Stroke [facility])    "they said I had a minor one when I had menigitis" (05/19/2013)   Thyroid disease    Tobacco abuse    quit 11/08/2012   Type II diabetes mellitus ([place])    Umbilical hernia     Past Surgical History:  Procedure Laterality Date   AORTIC VALVE REPLACEMENT N/A 09/11/2020   Procedure: AORTIC VALVE REPLACEMENT (AVR);  Surgeon: Gaye Pollack, MD;  Location: [place];  Service: Open Heart Surgery;  Laterality: N/A;    CHOLECYSTECTOMY  09/09/2011   Procedure: LAPAROSCOPIC CHOLECYSTECTOMY WITH INTRAOPERATIVE CHOLANGIOGRAM;  Surgeon: Belva Crome, MD;  Location: [place];  Service: General;  Laterality: N/A;   CLIPPING OF ATRIAL APPENDAGE N/A 09/11/2020   Procedure: CLIPPING OF ATRIAL APPENDAGE;  Surgeon: Gaye Pollack, MD;  Location: [place];  Service: Open Heart Surgery;  Laterality: N/A;   CORONARY ARTERY BYPASS GRAFT N/A 09/11/2020   Procedure: CORONARY ARTERY BYPASS GRAFTING (CABG), ON PUMP, TIMES 2 , USING LEFT INTERNAL MAMMARY ARTERY AND ENDOSCOPICALLY HARVESTED RIGHT GREATER SAPHENOUS VEIN;  Surgeon: Gaye Pollack, MD;  Location: [place];  Service: Open Heart Surgery;  Laterality: N/A;   HERNIA REPAIR  04/2006   UHR   IR THORACENTESIS ASP PLEURAL SPACE W/IMG GUIDE  10/25/2020   JOINT REPLACEMENT     KNEE ARTHROSCOPY Right    "w2 times" (05/19/2013)   RIGHT/LEFT HEART CATH AND CORONARY ANGIOGRAPHY N/A 09/07/2020   Procedure: RIGHT/LEFT HEART CATH AND CORONARY ANGIOGRAPHY;  Surgeon: Lorretta Harp, MD;  Location: [place] CV LAB;  Service: Cardiovascular;  Laterality: N/A;   TEE WITHOUT CARDIOVERSION N/A 09/11/2020   Procedure: TRANSESOPHAGEAL ECHOCARDIOGRAM (TEE);  Surgeon: Gaye Pollack, MD;  Location: [place];  Service: Open Heart Surgery;  Laterality: N/A;   TEE WITHOUT CARDIOVERSION N/A 11/16/2020   Procedure: TRANSESOPHAGEAL ECHOCARDIOGRAM (TEE);  Surgeon: Sueanne Margarita, MD;  Location: [facility];  Service: Cardiovascular;  Laterality: N/A;   TONSILLECTOMY     TOTAL KNEE ARTHROPLASTY Left 05/19/2013   TOTAL KNEE ARTHROPLASTY Left 05/19/2013   Procedure: TOTAL KNEE ARTHROPLASTY;  Surgeon: Newt Minion, MD;  Location: [place];  Service: Orthopedics;  Laterality: Left;  Left Total Knee Arthroplasty   TOTAL KNEE ARTHROPLASTY Right 11/24/2019   TOTAL KNEE ARTHROPLASTY Right 11/24/2019   Procedure: RIGHT TOTAL KNEE ARTHROPLASTY;  Surgeon: Newt Minion, MD;  Location: [place];  Service: Orthopedics;   Laterality: Right;   TRANSTHORACIC ECHOCARDIOGRAM  03/28/2005   EF=>55%, mod conc LVH; LA mod dilated & borderline RA enlargement; mild TR; mild pulm valve regurg    Home Medications Prior to Admission medications   Medication Sig Start Date End Date Taking? Authorizing Provider  acetaminophen (TYLENOL) 325 MG tablet Take 2 tablets (650 mg total) by mouth every 4 (four) hours as needed for headache or mild pain. 09/15/20   Nani Skillern, PA-C  amoxicillin (AMOXIL) 500 MG tablet Take 1 tablet (500 mg total) by mouth 2 (two) times daily for 3 days. 02/04/22 02/07/22  Lacinda Axon, MD  apixaban (ELIQUIS) 5 MG TABS tablet Take 5 mg by mouth 2 (two) times daily.    [provider]  aspirin EC 81 MG EC tablet Take 1 tablet (81 mg total) by mouth daily. Swallow whole. 09/18/20   Elgie Collard, PA-C  atorvastatin (LIPITOR) 40 MG tablet Take 40 mg by mouth daily in the afternoon. 11/22/15   [provider]  benzonatate (TESSALON) 100 MG capsule Take 100 mg by mouth 3 (three) times daily as needed for cough.    [provider]  Blood Glucose Monitoring Suppl (ONETOUCH VERIO FLEX SYSTEM) W/DEVICE KIT 1 each by Does not apply route daily. Dx: E11.9 03/30/15   Philemon Kingdom, MD  cholecalciferol (VITAMIN D3) 25 MCG (1000 UNIT) tablet Take 1,000 Units by mouth daily.    [provider]  Cyanocobalamin (VITAMIN B12) 500 MCG TABS Take 500 mcg by mouth 2 (two) times daily.    [provider]  empagliflozin (JARDIANCE) 10 MG TABS tablet Take 1 tablet (10 mg total) by mouth daily before breakfast. 02/12/21   Freada Bergeron, MD  glucose blood (ONETOUCH VERIO) test strip Use to test blood sugar 2-3 times daily as instructed. Dx: E11.9 10/12/15   Philemon Kingdom, MD  levothyroxine (SYNTHROID) 200 MCG tablet Take 1 tablet (200 mcg total) by mouth daily. 11/07/20 01/31/22  Sanjuan Dame, MD  metFORMIN (GLUCOPHAGE) 500 MG tablet Take 1,000 mg by mouth 2 (two)  times daily with a meal.    [provider]  metoprolol succinate (TOPROL-XL) 100 MG 24 hr tablet Take 1 tablet (100 mg total) by mouth daily. Take with or immediately following a meal. 03/08/21   Pemberton, Greer Ee, MD  Multiple Vitamins-Minerals (CENTRUM SILVER 50+MEN) TABS Take 1 tablet by mouth daily with breakfast.    [provider]  nitroGLYCERIN (NITROSTAT) 0.4 MG SL tablet Place 0.4 mg under the tongue every 5 (five) minutes as needed for chest pain. 08/17/20   [provider]  nortriptyline (PAMELOR) 10 MG capsule Take 10 mg by mouth See admin instructions. Take 10 mg by mouth 30-45 minutes prior to bedtime for overactive intestinal muscles    [provider]  omeprazole (PRILOSEC) 20 MG capsule Take 20 mg by mouth daily before breakfast. 01/18/20   [provider]  Jonetta Speak LANCETS FINE MISC Use to  test blood sugar 2-3 times daily as instructed. Dx: E11.9 10/12/15   Philemon Kingdom, MD  sacubitril-valsartan (ENTRESTO) 49-51 MG Take 1 tablet by mouth 2 (two) times daily. 02/19/21   Freada Bergeron, MD  spironolactone (ALDACTONE) 25 MG tablet Take 0.5 tablets (12.5 mg total) by mouth daily. 06/12/21 01/31/22  Richardson Dopp T, PA-C  Tiotropium Bromide Monohydrate (SPIRIVA RESPIMAT) 2.5 MCG/ACT AERS Inhale 2 puffs into the lungs daily. 02/03/22   Lacinda Axon, MD  torsemide (DEMADEX) 20 MG tablet Take 20 mg by mouth See admin instructions. Take 20 mg by mouth in the morning and may take an additional 20 mg once a day as directed for a period of 3 days, for an overnight weight gain of 5 pounds 02/28/21   Supple, Megan E, RPH-CPP  VENTOLIN HFA 108 (90 Base) MCG/ACT inhaler Inhale 1-2 puffs into the lungs every 4 (four) hours as needed for wheezing or shortness of breath.  07/12/15   [provider]      Allergies    Codeine and Demerol    Review of Systems   Review of Systems  Physical Exam Updated Vital Signs BP 129/74    Pulse 76   Temp 99 F (37.2 C) (Oral)   Resp (!) 32   SpO2 98%  Physical Exam  ED Results / Procedures / Treatments   Labs (all labs ordered are listed, but only abnormal results are displayed) Labs Reviewed - No data to display  EKG None  Radiology No results found.  Procedures Procedures  {Document cardiac monitor, telemetry assessment procedure when appropriate:1}  Medications Ordered in ED Medications - No data to display  ED Course/ Medical Decision Making/ A&P                           Medical Decision Making  ***  {Document critical care time when appropriate:1} {Document review of labs and clinical decision tools ie heart score, Chads2Vasc2 etc:1}  {Document your independent review of radiology images, and any outside records:1} {Document your discussion with family members, caretakers, and with consultants:1} {Document social determinants of health affecting pt's care:1} {Document your decision making why or why not admission, treatments were needed:1} Final Clinical Impression(s) / ED Diagnoses Final diagnoses:  None    Rx / DC Orders ED Discharge Orders     None

## 2022-02-06 DIAGNOSIS — R5381 Other malaise: Secondary | ICD-10-CM

## 2022-02-06 DIAGNOSIS — R509 Fever, unspecified: Secondary | ICD-10-CM

## 2022-02-06 DIAGNOSIS — G9349 Other encephalopathy: Secondary | ICD-10-CM | POA: Diagnosis not present

## 2022-02-06 DIAGNOSIS — R8290 Unspecified abnormal findings in urine: Secondary | ICD-10-CM

## 2022-02-06 DIAGNOSIS — R410 Disorientation, unspecified: Secondary | ICD-10-CM

## 2022-02-06 LAB — CBC WITH DIFFERENTIAL/PLATELET
Abs Immature Granulocytes: 0.02 10*3/uL (ref 0.00–0.07)
Abs Immature Granulocytes: 0.05 10*3/uL (ref 0.00–0.07)
Basophils Absolute: 0 10*3/uL (ref 0.0–0.1)
Basophils Absolute: 0 10*3/uL (ref 0.0–0.1)
Basophils Relative: 0 %
Basophils Relative: 0 %
Eosinophils Absolute: 0.1 10*3/uL (ref 0.0–0.5)
Eosinophils Absolute: 0.1 10*3/uL (ref 0.0–0.5)
Eosinophils Relative: 1 %
Eosinophils Relative: 1 %
HCT: 39.6 % (ref 39.0–52.0)
HCT: 40.6 % (ref 39.0–52.0)
Hemoglobin: 13.7 g/dL (ref 13.0–17.0)
Hemoglobin: 14.2 g/dL (ref 13.0–17.0)
Immature Granulocytes: 0 %
Immature Granulocytes: 1 %
Lymphocytes Relative: 9 %
Lymphocytes Relative: 9 %
Lymphs Abs: 1 10*3/uL (ref 0.7–4.0)
Lymphs Abs: 1.1 10*3/uL (ref 0.7–4.0)
MCH: 31.7 pg (ref 26.0–34.0)
MCH: 32 pg (ref 26.0–34.0)
MCHC: 34.6 g/dL (ref 30.0–36.0)
MCHC: 35 g/dL (ref 30.0–36.0)
MCV: 91.4 fL (ref 80.0–100.0)
MCV: 91.7 fL (ref 80.0–100.0)
Monocytes Absolute: 0.6 10*3/uL (ref 0.1–1.0)
Monocytes Absolute: 0.6 10*3/uL (ref 0.1–1.0)
Monocytes Relative: 5 %
Monocytes Relative: 5 %
Neutro Abs: 9.2 10*3/uL — ABNORMAL HIGH (ref 1.7–7.7)
Neutro Abs: 9.5 10*3/uL — ABNORMAL HIGH (ref 1.7–7.7)
Neutrophils Relative %: 84 %
Neutrophils Relative %: 85 %
Platelets: 171 10*3/uL (ref 150–400)
Platelets: 177 10*3/uL (ref 150–400)
RBC: 4.32 MIL/uL (ref 4.22–5.81)
RBC: 4.44 MIL/uL (ref 4.22–5.81)
RDW: 12.5 % (ref 11.5–15.5)
RDW: 12.5 % (ref 11.5–15.5)
WBC: 11.1 10*3/uL — ABNORMAL HIGH (ref 4.0–10.5)
WBC: 11.4 10*3/uL — ABNORMAL HIGH (ref 4.0–10.5)
nRBC: 0 % (ref 0.0–0.2)
nRBC: 0 % (ref 0.0–0.2)

## 2022-02-06 LAB — TROPONIN I (HIGH SENSITIVITY)
Troponin I (High Sensitivity): 13 ng/L (ref <18)
Troponin I (High Sensitivity): 13 ng/L (ref <18)

## 2022-02-06 LAB — BASIC METABOLIC PANEL
Anion gap: 11 (ref 5–15)
BUN: 22 mg/dL (ref 8–23)
CO2: 24 mmol/L (ref 22–32)
Calcium: 8.7 mg/dL — ABNORMAL LOW (ref 8.9–10.3)
Chloride: 101 mmol/L (ref 98–111)
Creatinine, Ser: 1.11 mg/dL (ref 0.61–1.24)
GFR, Estimated: >60 mL/min (ref >60)
Glucose, Bld: 141 mg/dL — ABNORMAL HIGH (ref 70–99)
Potassium: 3.5 mmol/L (ref 3.5–5.1)
Sodium: 136 mmol/L (ref 135–145)

## 2022-02-06 LAB — URINALYSIS, ROUTINE W REFLEX MICROSCOPIC
Bilirubin Urine: NEGATIVE
Glucose, UA: >=500 mg/dL — AB
Hgb urine dipstick: NEGATIVE
Ketones, ur: 5 mg/dL — AB
Nitrite: NEGATIVE
Protein, ur: NEGATIVE mg/dL
Specific Gravity, Urine: 1.016 (ref 1.005–1.030)
WBC, UA: >50 WBC/hpf — ABNORMAL HIGH (ref 0–5)
pH: 5 (ref 5.0–8.0)

## 2022-02-06 LAB — COMPREHENSIVE METABOLIC PANEL
ALT: 24 U/L (ref 0–44)
AST: 19 U/L (ref 15–41)
Albumin: 3.5 g/dL (ref 3.5–5.0)
Alkaline Phosphatase: 65 U/L (ref 38–126)
Anion gap: 10 (ref 5–15)
BUN: 22 mg/dL (ref 8–23)
CO2: 27 mmol/L (ref 22–32)
Calcium: 9.4 mg/dL (ref 8.9–10.3)
Chloride: 99 mmol/L (ref 98–111)
Creatinine, Ser: 1.15 mg/dL (ref 0.61–1.24)
GFR, Estimated: >60 mL/min (ref >60)
Glucose, Bld: 139 mg/dL — ABNORMAL HIGH (ref 70–99)
Potassium: 3.8 mmol/L (ref 3.5–5.1)
Sodium: 136 mmol/L (ref 135–145)
Total Bilirubin: 1.8 mg/dL — ABNORMAL HIGH (ref 0.3–1.2)
Total Protein: 6.4 g/dL — ABNORMAL LOW (ref 6.5–8.1)

## 2022-02-06 LAB — CBG MONITORING, ED
Glucose-Capillary: 131 mg/dL — ABNORMAL HIGH (ref 70–99)
Glucose-Capillary: 138 mg/dL — ABNORMAL HIGH (ref 70–99)

## 2022-02-06 LAB — T4, FREE: Free T4: 1.32 ng/dL — ABNORMAL HIGH (ref 0.61–1.12)

## 2022-02-06 LAB — SARS CORONAVIRUS 2 BY RT PCR: SARS Coronavirus 2 by RT PCR: NEGATIVE

## 2022-02-06 LAB — LACTIC ACID, PLASMA
Lactic Acid, Venous: 1.1 mmol/L (ref 0.5–1.9)
Lactic Acid, Venous: 1.5 mmol/L (ref 0.5–1.9)

## 2022-02-06 LAB — MAGNESIUM: Magnesium: 1.7 mg/dL (ref 1.7–2.4)

## 2022-02-06 LAB — BRAIN NATRIURETIC PEPTIDE: B Natriuretic Peptide: 263.8 pg/mL — ABNORMAL HIGH (ref 0.0–100.0)

## 2022-02-06 LAB — TSH: TSH: 1.812 u[IU]/mL (ref 0.350–4.500)

## 2022-02-06 LAB — GROUP A STREP BY PCR: Group A Strep by PCR: NOT DETECTED

## 2022-02-06 MED ORDER — ATORVASTATIN CALCIUM 40 MG PO TABS: 40.0000 mg | ORAL_TABLET | Freq: Every day | ORAL | Status: DC

## 2022-02-06 MED ORDER — METOPROLOL SUCCINATE ER 100 MG PO TB24
100.0000 mg | ORAL_TABLET | Freq: Every day | ORAL | Status: DC
  Administered 2022-02-06: 100 mg via ORAL
  Filled 2022-02-06: qty 1

## 2022-02-06 MED ORDER — SPIRONOLACTONE 12.5 MG HALF TABLET
12.5000 mg | ORAL_TABLET | Freq: Every day | ORAL | Status: DC
  Administered 2022-02-06: 12.5 mg via ORAL
  Filled 2022-02-06: qty 1

## 2022-02-06 MED ORDER — SODIUM CHLORIDE 0.9 % IV SOLN: 2.0000 g | Freq: Three times a day (TID) | INTRAVENOUS | Status: DC

## 2022-02-06 MED ORDER — SALINE SPRAY 0.65 % NA SOLN
1.0000 | NASAL | Status: DC | PRN
  Filled 2022-02-06: qty 44

## 2022-02-06 MED ORDER — METFORMIN HCL 500 MG PO TABS
1000.0000 mg | ORAL_TABLET | Freq: Two times a day (BID) | ORAL | Status: DC
Start: 2022-02-06 — End: 2022-02-06

## 2022-02-06 MED ORDER — UMECLIDINIUM BROMIDE 62.5 MCG/ACT IN AEPB
1.0000 | INHALATION_SPRAY | Freq: Every day | RESPIRATORY_TRACT | 2 refills | Status: AC
Start: 2022-02-07 — End: ?

## 2022-02-06 MED ORDER — UMECLIDINIUM BROMIDE 62.5 MCG/ACT IN AEPB
1.0000 | INHALATION_SPRAY | Freq: Every day | RESPIRATORY_TRACT | Status: DC
Start: 2022-02-06 — End: 2022-02-06
  Administered 2022-02-06: 1 via RESPIRATORY_TRACT
  Filled 2022-02-06: qty 7

## 2022-02-06 MED ORDER — VANCOMYCIN HCL 2000 MG/400ML IV SOLN
2000.0000 mg | Freq: Once | INTRAVENOUS | Status: AC
  Administered 2022-02-06: 2000 mg via INTRAVENOUS
  Filled 2022-02-06: qty 400

## 2022-02-06 MED ORDER — LEVOTHYROXINE SODIUM 100 MCG PO TABS
200.0000 ug | ORAL_TABLET | Freq: Every day | ORAL | Status: DC
  Administered 2022-02-06: 200 ug via ORAL
  Filled 2022-02-06: qty 2

## 2022-02-06 MED ORDER — SODIUM CHLORIDE 0.9 % IV SOLN
2.0000 g | INTRAVENOUS | Status: DC
  Administered 2022-02-06: 2 g via INTRAVENOUS
  Filled 2022-02-06: qty 20

## 2022-02-06 MED ORDER — VANCOMYCIN HCL 1250 MG/250ML IV SOLN
1250.0000 mg | INTRAVENOUS | Status: DC
Start: 2022-02-07 — End: 2022-02-06

## 2022-02-06 MED ORDER — POTASSIUM CHLORIDE CRYS ER 20 MEQ PO TBCR
40.0000 meq | EXTENDED_RELEASE_TABLET | Freq: Once | ORAL | Status: AC
Start: 2022-02-06 — End: 2022-02-06
  Administered 2022-02-06: 40 meq via ORAL
  Filled 2022-02-06: qty 2

## 2022-02-06 MED ORDER — EMPAGLIFLOZIN 10 MG PO TABS
10.0000 mg | ORAL_TABLET | Freq: Every day | ORAL | Status: DC
  Filled 2022-02-06: qty 1

## 2022-02-06 MED ORDER — SODIUM CHLORIDE 0.9 % IV SOLN: 1.0000 g | Freq: Two times a day (BID) | INTRAVENOUS | Status: DC

## 2022-02-06 MED ORDER — SULFAMETHOXAZOLE-TRIMETHOPRIM 800-160 MG PO TABS: 1.0000 | ORAL_TABLET | Freq: Two times a day (BID) | ORAL | 0 refills | Status: AC

## 2022-02-06 MED ORDER — TIOTROPIUM BROMIDE MONOHYDRATE 2.5 MCG/ACT IN AERS: 2.0000 | INHALATION_SPRAY | Freq: Every day | RESPIRATORY_TRACT | Status: DC

## 2022-02-06 MED ORDER — SACUBITRIL-VALSARTAN 49-51 MG PO TABS
1.0000 | ORAL_TABLET | Freq: Two times a day (BID) | ORAL | Status: DC
  Administered 2022-02-06: 1 via ORAL
  Filled 2022-02-06 (×2): qty 1

## 2022-02-06 MED ORDER — ACETAMINOPHEN 325 MG PO TABS: 650.0000 mg | ORAL_TABLET | Freq: Four times a day (QID) | ORAL | Status: DC | PRN

## 2022-02-06 MED ORDER — BENZONATATE 100 MG PO CAPS: 100.0000 mg | ORAL_CAPSULE | Freq: Three times a day (TID) | ORAL | Status: DC | PRN

## 2022-02-06 MED ORDER — NYSTATIN 100000 UNIT/ML MT SUSP
5.0000 mL | Freq: Four times a day (QID) | OROMUCOSAL | 0 refills | Status: AC
Start: 2022-02-06 — End: ?

## 2022-02-06 MED ORDER — METRONIDAZOLE 500 MG/100ML IV SOLN
500.0000 mg | Freq: Two times a day (BID) | INTRAVENOUS | Status: DC
  Administered 2022-02-06: 500 mg via INTRAVENOUS
  Filled 2022-02-06: qty 100

## 2022-02-06 MED ORDER — SODIUM CHLORIDE 0.9 % IV SOLN
2.0000 g | Freq: Once | INTRAVENOUS | Status: DC
  Administered 2022-02-06: 2 g via INTRAVENOUS
  Filled 2022-02-06: qty 12.5

## 2022-02-06 MED ORDER — INSULIN ASPART 100 UNIT/ML IJ SOLN
0.0000 [IU] | Freq: Three times a day (TID) | INTRAMUSCULAR | Status: DC
  Administered 2022-02-06 (×2): 2 [IU] via SUBCUTANEOUS

## 2022-02-06 MED ORDER — ACETAMINOPHEN 650 MG RE SUPP: 650.0000 mg | Freq: Four times a day (QID) | RECTAL | Status: DC | PRN

## 2022-02-06 MED ORDER — ALBUTEROL SULFATE (2.5 MG/3ML) 0.083% IN NEBU
2.5000 mg | INHALATION_SOLUTION | Freq: Four times a day (QID) | RESPIRATORY_TRACT | Status: DC | PRN
Start: 2022-02-06 — End: 2022-02-06

## 2022-02-06 MED ORDER — APIXABAN 5 MG PO TABS
5.0000 mg | ORAL_TABLET | Freq: Two times a day (BID) | ORAL | Status: DC
  Administered 2022-02-06: 5 mg via ORAL
  Filled 2022-02-06: qty 1

## 2022-02-06 MED ORDER — NYSTATIN 100000 UNIT/ML MT SUSP
5.0000 mL | Freq: Four times a day (QID) | OROMUCOSAL | Status: DC
  Administered 2022-02-06: 500000 [IU] via OROMUCOSAL
  Filled 2022-02-06 (×4): qty 5

## 2022-02-06 MED ORDER — ASPIRIN 81 MG PO TBEC
81.0000 mg | DELAYED_RELEASE_TABLET | Freq: Every day | ORAL | Status: DC
  Administered 2022-02-06: 81 mg via ORAL
  Filled 2022-02-06: qty 1

## 2022-02-06 MED ORDER — PANTOPRAZOLE SODIUM 40 MG PO TBEC
40.0000 mg | DELAYED_RELEASE_TABLET | Freq: Every day | ORAL | Status: DC
  Administered 2022-02-06: 40 mg via ORAL
  Filled 2022-02-06: qty 1

## 2022-02-06 MED ORDER — TORSEMIDE 20 MG PO TABS
20.0000 mg | ORAL_TABLET | Freq: Every day | ORAL | Status: DC
  Administered 2022-02-06: 20 mg via ORAL
  Filled 2022-02-06: qty 1

## 2022-02-06 NOTE — H&P (Signed)
Date: 02/06/2022               Patient Name:  Mason Patton MRN: [id number]  DOB: 26-Apr-1946 Age / Sex: 76 y.o., male   PCP: Ephriam Jenkins, MD         Medical Service: Internal Medicine Teaching Service         Attending Physician: Dr. Sid Falcon, MD    First Contact: Angelique Blonder, DO      Pager: [phone]      Second Contact: Sanjuana Letters, DO      Pager: Maryjean Morn [phone]           After Hours (After 5p/  First Contact Pager: [phone]  weekends / holidays): Second Contact Pager: [phone]   SUBJECTIVE   Chief Complaint: Subjective fevers, altered mental status, reduced p.o. intake  History of Present Illness: Mason Patton is a 76 y.o. male with hx of aortic stenosis s/p AVR, COPD, OSA on CPAP, afib on Eliquis, T2DM, HTN, frequent urinary infections, and prior hospitalization for MRSE bacteremia, presenting to the ED with generalized weakness, confusion and fever.  Mason Patton was admitted on the [facility] between 01/31/2022 and 02/03/2022 for a very similar presentation.  During that admission, he was diagnosed with staph hominis bacteremia in the setting of a recent dental procedure with the context of his mechanical aortic valve.  Per ID, cultures growth could represent contamination instead of true bacteremia however it was determined that in this patient with multiple risk factors, it would be prudent to treat bacteremia with course of IV antibiotics in the inpatient setting and then 3 days of amoxicillin twice daily on discharge. Of note, he had only had a TTE during the previous admission.  Mason Patton reported feeling well on the afternoon of his discharge on 7/30 but then developed chills and worsening weakness the next day.  He endorses some subjective fevers earlier today on the afternoon of 8/1.  His wife reports that he has been having some confusion since returning home.  She states that once when waking up from a nap he was not sure whether was  daytime or nighttime.  She additionally states that she saw him trying to smoke in the porch, but he had his lighter in his mouth instead of his cigarette.  She states that these episodes are new for him and that he has never had anything like this before.  She also endorses that he has had poor appetite after returning home.  Mason Patton also endorses some sore throat over the last day or 2.  Of note, Mason Patton wife mentioned that he took both amoxicillin doses on 7/31 and his morning dose on 8/1.  He also started taking Spiriva after returning home.  She mentions that he had taken both of these medications before the onset of his confusion.  He did not have any dysphagia, cough productive of sputum, nausea or vomiting, dysuria or increased urinary frequency, abdominal pain or chest pain, new neurological symptoms, rash, back pain, diarrhea, and he feels like he is not currently having any confusion.  Of note, he has had no sick contacts since returning home from the hospital.  He was sent home with home health resources, but he has not seen them yet.  ED Course: Patient was hemodynamically stable and afebrile upon being received in the ED.  He was noted to be tachypneic and ill-appearing.  Initial infectious work-up was obtained and he was placed on vancomycin,  metronidazole, and cefepime.  Meds:  -Amoxicillin 500 mg taken 2 doses Monday, and morning dose Tuesday -eliquis 24m bid -aspirin81 daily -lipitor 40 mg at night -vit d /b12 -jardiance 10 daily -metformin 1000 mg bid -synthroid 200 in morning -metoprolol 100 daily -nortriptyline 168mnightly for intestinal muscle -omeprazole 20 mg in the morning -entresto BID -spironolactone 12.24m19maily -torsemide 69m49mily or 40 mg daily -spiriva (felt face numbness and confusion after taking this once and hasnt taken it since)  Past Medical History Past Surgical History:  Procedure Laterality Date   AORTIC VALVE REPLACEMENT N/A 09/11/2020    Procedure: AORTIC VALVE REPLACEMENT (AVR);  Surgeon: BartGaye Pollack;  [facility];  Laterality: N/A;   CHOLECYSTECTOMY  09/09/2011   Procedure: LAPAROSCOPIC CHOLECYSTECTOMY WITH INTRAOPERATIVE CHOLANGIOGRAM;  Surgeon: JameBelva Crome;  [facility] General;  Laterality: N/A;   CLIPPING OF ATRIAL APPENDAGE N/A 09/11/2020   Procedure: CLIPPING OF ATRIAL APPENDAGE;  Surgeon: BartGaye Pollack;  [facility];  Laterality: N/A;   CORONARY ARTERY BYPASS GRAFT N/A 09/11/2020   Procedure: CORONARY ARTERY BYPASS GRAFTING (CABG), ON PUMP, TIMES 2 , USING LEFT INTERNAL MAMMARY ARTERY AND ENDOSCOPICALLY HARVESTED RIGHT GREATER SAPHENOUS VEIN;  Surgeon: BartGaye Pollack;  [facility] Surgery;  Laterality: N/A;   HERNIA REPAIR  04/2006   UHR   IR THORACENTESIS ASP PLEURAL SPACE W/IMG GUIDE  10/25/2020   JOINT REPLACEMENT     KNEE ARTHROSCOPY Right    "w2 times" (05/19/2013)   RIGHT/LEFT HEART CATH AND CORONARY ANGIOGRAPHY N/A 09/07/2020   Procedure: RIGHT/LEFT HEART CATH AND CORONARY ANGIOGRAPHY;  Surgeon: BerrLorretta Harp;  [facility];  Service: Cardiovascular;  Laterality: N/A;   TEE WITHOUT CARDIOVERSION N/A 09/11/2020   Procedure: TRANSESOPHAGEAL ECHOCARDIOGRAM (TEE);  Surgeon: BartGaye Pollack;  [facility];  Laterality: N/A;   TEE WITHOUT CARDIOVERSION N/A 11/16/2020   Procedure: TRANSESOPHAGEAL ECHOCARDIOGRAM (TEE);  Surgeon: TurnSueanne Margarita;  [facility];  Service: Cardiovascular;  Laterality: N/A;   TONSILLECTOMY     TOTAL KNEE ARTHROPLASTY Left 05/19/2013   TOTAL KNEE ARTHROPLASTY Left 05/19/2013   Procedure: TOTAL KNEE ARTHROPLASTY;  Surgeon: MarcNewt Minion;  Location: [facility]: Orthopedics;  Laterality: Left;  Left Total Knee Arthroplasty   TOTAL KNEE ARTHROPLASTY Right 11/24/2019   TOTAL KNEE  ARTHROPLASTY Right 11/24/2019   Procedure: RIGHT TOTAL KNEE ARTHROPLASTY;  Surgeon: DudaNewt Minion;  [facility]: Orthopedics;  Laterality: Right;   TRANSTHORACIC ECHOCARDIOGRAM  03/28/2005   EF=>55%, mod conc LVH; LA mod dilated & borderline RA enlargement; mild TR; mild pulm valve regurg  [facility]  Lives With: His wife at home Occupation: Support: Has family for support from his wife and son.  Additionally he was discharged with home health resources after the previous hospitalization, but states that he has not met with them yet Level of Function: Able to perform most of his ADLs, uses a walker [facility] [place]: Has smoked about half a pack a day for 50+ years, no alcohol use, no other substance use  Family History:  Family History  Problem Relation Age of Onset   Cancer Maternal Aunt        colon  Cancer Maternal Aunt        lung   Thyroid disease Mother    Hyperlipidemia Father    Diabetes Maternal Grandmother    Asthma Child    Diabetes type I Child      Allergies: Allergies as of 02/05/2022 - Review Complete 02/03/2022  Allergen Reaction Noted   Codeine Nausea And Vomiting 09/08/2011   Demerol Nausea And Vomiting 09/08/2011    Review of Systems: A complete ROS was negative except as per HPI.   OBJECTIVE:   Physical Exam: Blood pressure 139/75, pulse (!) 56, temperature 99 F (37.2 C), temperature source Oral, resp. rate (!) 33, SpO2 99 %.  Constitutional: Elderly-appearing male laying in bed, in no acute distress HEENT: Mallampati 4, difficulty visualizing oropharynx but no erythema noted with limited examination. Cardiovascular: Regular rate, irregular rhythm, no murmurs, rubs or gallops, extremity pulses 2+ Pulmonary/Chest: normal work of breathing on room air, lungs clear to auscultation bilaterally Abdominal: soft, non-tender, non-distended Extremities: Trace pitting edema of  bilateral lower extremities.  Extremity pulses 2+ Skin: No clear rashes visualized. Neuro: alert and oriented x3.  5 out of 5 strength in all extremities.  Deficits in attention and short-term memory.  Labs:    Latest Ref Rng & Units 02/06/2022    3:20 AM 02/05/2022   11:40 PM 02/03/2022    1:29 AM 02/02/2022   12:52 AM 02/01/2022    4:04 AM 01/31/2022    8:50 AM 05/19/2021   11:02 AM  CBC  Hb 13.0 - 17.0 g/dL 13.7  14.2  14.6  14.6  13.8  14.2  7.4   Hct 39.0 - 52.0 % 39.6  40.6  42.6  42.3  39.9  42.5  26.0   MCV 80.0 - 100.0 fL 91.7  91.4  91.6  91.8  91.7  94.4    Plts 150 - 400 K/uL 171  177  149  147  148  146        Latest Ref Rng & Units 02/06/2022    3:20 AM 02/05/2022   11:40 PM 02/03/2022    1:29 AM 02/02/2022   12:52 AM 02/01/2022    4:04 AM 01/31/2022    8:50 AM 06/25/2021   12:14 PM  CMP  Glucose 70 - 99 mg/dL 141  139  153  156  113  100  190   BUN 8 - 23 mg/dL '22  22  19  17  14  16  15   ' Creatinine 0.61 - 1.24 mg/dL 1.11  1.15  0.98  1.07  0.86  1.03  0.78   Sodium 135 - 145 mmol/L 136  136  138  138  134  138  140   Potassium 3.5 - 5.1 mmol/L 3.5  3.8  4.5  3.8  3.5  3.8  4.5   Chloride 98 - 111 mmol/L 101  99  105  103  100  103  100   CO2 22 - 32 mmol/L '24  27  22  26  24  26  25   ' Calcium 8.9 - 10.3 mg/dL 8.7  9.4  8.8  8.8  8.6  8.9  9.4   Total Protein 6.5 - 8.1 g/dL  6.4     6.6    Total Bilirubin 0.3 - 1.2 mg/dL  1.8     1.3    Alkaline Phos 38 - 126 U/L  65     68    AST 15 - 41  U/L  19     18    ALT 0 - 44 U/L  24     15    COVID PCR-negative, group A strep PCR negative Lactic acid 1.5 UA showing glucose greater than 500, ketones present, large leukocytes, >WBCs, rare bacteria Blood cultures pending BNP 263, likely close to baseline (values ranging from [phone] for prior hospitalizations for CHF exacerbation) Troponin 13, pending TSH 1.812, free T4 1.32  Imaging: Chest x-ray (02/05/2022): Minimal pulmonary vascular congestion and right-sided pleural  effusion, similar to prior chest x-ray 01/31/2022  EKG: personally reviewed my interpretation is A-fib without RVR, left axis deviation.  Similar to prior EKG 02/01/2022  ASSESSMENT & PLAN:   Assessment & Plan by Problem: Principal Problem:   Fever and chills Active Problems:   Malaise and fatigue   THELONIOUS KAUFFMANN is a 76 y.o. male with PMH aortic stenosis s/p AVR, COPD, OSA on CPAP, afib on Eliquis, T2DM, HTN, CAD status post CABG, frequent urinary infections, and prior hospitalization for MRSE bacteremia, presenting to the ED with generalized weakness, confusion, and fever and admitted for fever of unknown origin and acute encephalopathy on hospital day 0  #Fever of unknown origin Recent hospitalization with diagnosis of possible staph hominis bacteremia, but also possibly contaminant.  Treated with IV antibiotics and then discharged with amoxicillin.  Patient returns with similar symptoms as prior presentation following a dental procedure.  Patient at high risk for staph endocarditis given his mechanical valve and has had prior hospitalization for MRSE bacteremia. TTE last hospitalization was negative for endocarditis, no TEE obtained.  Patient's only symptoms are chills and generalized weakness with some sore throat and he has been afebrile since presentation for this admission with only mild leukocytosis.  Initial group A strep and COVID-negative.  Chest x-ray unrevealing for infectious etiology.  UA showing possible concern for UTI in this patient who is taking Jardiance, but patient reporting no associated symptoms and without any associated exam findings.  Urine cultures and blood cultures pending.  Patient on broad-spectrum antibiotics started in ED. - Follow-up urine cultures, blood cultures - Consider TEE if endocarditis is suspected. - Continue IV ceftriaxone, vancomycin for broad-spectrum coverage of UTI/bacteremia.  Low suspicion of Pseudomonas or anaerobic infection at this  time.  #Acute encephalopathy Patient's wife reports confusion after prior discharge.  During our assessment, he is alert and oriented x3 but has some deficits in attention and higher-level cognitive functioning, however this could possibly be attributed to time of day.  Of note, his symptoms started after he started taking amoxicillin and Spiriva at home.  He is also on other centrally acting medications including nortriptyline.  Electrolytes and serum glucose values have been normal this admission.  Low concern for ACS, stroke, CHF exacerbation, A-fib with RVR at this time.  Infectious work-up as above with possible UTI. - Holding Spiriva for now, consider discontinuing nortriptyline.  Amoxicillin course would not be finished as patient is now on IV antibiotics again. - Infectious work-up as above - Delirium precautions  #HFimpEF #Aortic stenosis status post AVR #HTN EF 50 to 55% on echo 02/01/2022 with intact bioprosthetic AVR. Low suspicion for heart failure exacerbation at this time given lack of symptoms, euvolemic exam, low BNP, and minimal x-ray findings.  SBPs between 130s to 170s in the ED.  Creatinine within normal limits.  We will continue home GDMT, but consider holding Jardiance in the setting of possible UTI - Continue home Entresto 49-51 mg twice daily, torsemide 20  mg daily, spironolactone 12.5 mg daily, metoprolol succinate 100 mg daily - Consider holding Jardiance setting of possible UTI.  #A-fib on Eliquis Patient remains in A-fib with stable heart rate in the 70s to 80s - Continue home metoprolol succinate 100 mg daily - Continue home Eliquis 5 mg twice daily  #CAD status post CABG #HLD CABG in March 2022.  No chest pain or concerns for ACS at this time.  LDL on 02/01/2022 46 on home Lipitor. - Continue home aspirin 81 mg daily - Continue home Lipitor 40 mg daily  #COPD #Smoking history Patient with possible side effects to Spiriva as above.  Has albuterol inhaler at  home and continues to use.  Patient is 25+ pack year smoker and continues to smoke at this time.  Does not require oxygen at baseline. - Holding Spiriva inhaler for now due to possible side effects as above.  Patton inhaler during admission plus albuterol nebs as needed. - Consider low-dose screening CT in the outpatient setting  #T2DM A1c last admission 5.7 on 02/01/2022.  Glipizide discontinued at discharge.  Patient on Jardiance currently, but will hold due to concern for UTI. - Hold Jardiance above - SSI ACHS  #Hypothyroidism TSH within normal limits, will continue home Synthroid 200 mg daily.  #GERD: Continue Protonix 40 #OSA: Continue CPAP  Diet: Heart Healthy VTE:  Eliquis 5 mg twice daily IVF: None,None Code: Full  Prior to Admission Living Arrangement: Home, living with his wife Anticipated Discharge Location: Home Barriers to Discharge: Fever work-up, antibiotic treatment  Dispo: Admit patient to Observation with expected length of stay less than 2 midnights.  Signed: Linus Galas, MD Internal Medicine Resident PGY-1  02/06/2022, 2:59 AM

## 2022-02-06 NOTE — Hospital Course (Addendum)
Mason Patton is a 76 y.o. male with PMH aortic stenosis s/p AVR, COPD, OSA on CPAP, afib on Eliquis, T2DM, HTN, CAD status post CABG, frequent urinary infections, MRSE bacteremia, and prior hospitalization 3 days ago for suspected bacteremia. He presented to the ED with generalized weakness, confusion, and fever and was readmitted for fever of unknown origin and treated for suspected UTI following workup.   UTI Multifactorial acute encephalopathy Patient presented from home following onset of weakness, confusion, and fever.  He was alert and oriented x3 with mild attention deficits. Of note, patient had a hospitalization 3 days prior with diagnosis of possible staph hominis bacteremia in the setting of multiple dental procedures versus possible culture contaminant. At the time, he was treated with IV antibiotics and then discharged on course of oral amoxicillin, which he did complete prior to readmission.  Patient at high risk for staph endocarditis given his mechanical valve, but TTE last hospitalization was negative for endocarditis, no TEE obtained this admission.  Patient on started on IV ceftriaxone and vancomycin for broad-spectrum coverage in ED. Initial group A strep and COVID-negative.  Chest x-ray unrevealing for infectious etiology.  UA showed possible concern for UTI and urine cultures obtained. Patient presumptively treated for UTI by narrowing antibiotics to 10 day course of oral Bactrim on discharge.  Blood cultures and urine cultures pending with no growth at <24 hours at time of discharge.  Electrolytes and serum glucose values remained normal this admission.  HFimpEF Aortic stenosis status post AVR HTN During previous admission, TTE on 02/01/2022 showed EF of 50 to 55% with intact bioprosthetic AVR. SBPs ranged from 130s to 160s this admission.  Creatinine remained within normal limits.  Patient continued on his GDMT this admission, but [place] Pepper was held and altogether discontinued at  discharge in the setting of possible UTI.  T2DM Oral candidiasis A1c was 5.7 on 02/01/2022 during last admission.  Discontinued patent's Jardiance due to concern for UTI on UA and thrush found on physical exam. Patient maintained on SSI ACHS during this admission and discharged on home Metformin dose of 1,000 mg BID.  Start patient on 14 days of nystatin suspension 5 mL 4 times a day for oral candidiasis.  A-fib on Eliquis Patient arrived in A-fib and remained in A-fib with stable heart rate in the 70s to 80s during much of his admission.  He was continued on home metoprolol succinate 100 mg daily and home Eliquis 5 mg twice daily.  CAD status post CABG HLD Patient underwent CABG in March 2022.  LDL on 02/01/2022 was 46.  Patient continued on home aspirin 81 mg daily and home Lipitor 40 mg daily.  COPD Tobacco use disorder Patient's wife reported that patient had episode of lip numbness with tingling and possible confusion following first dose of Spiriva inhaler after last admission.  Spiriva discontinued and patient tried on Ellipta maintenance inhaler prior to discharge.  Patient experienced no adverse events more than 4 hours after Ellipta administration.  Patient has albuterol inhaler at home and continues to use it as appropriate.  Patient is 25+ pack year smoker and continues to smoke at this time.  She is not interested in smoking station counseling at this time.  He does not require oxygen at baseline and was discharged without oxygen.  Hypothyroidism TSH was within normal limits on admission and patient was not bradycardic or hypotensive. Continued home Synthroid 200 mg daily.  GERD Patient continued on Protonix 40 mg this admission. He did not  report any reflux symptoms.  OSA Patient continued on nightly CPAP this admission and tolerated the therapy well. He continues to request CPAP during the day if he is lying in bed.   Subjective This morning, the patient appears well and  states that he is feeling much better than he was before.  He states that he is feeling stronger and earlier stood up unassisted in his room.  This morning, the patient ate breakfast.  Reports that his mouth feels dry. He denies any shortness of breath, chest pain, fevers, chills, dysuria, urinary urgency, abdominal pain, confusion, or cough.  The patient's wife arrived at bedside in the afternoon.  She notes the patient has some occasional confusion at home at baseline and he is currently back to baseline as far she can tell.  She felt comfortable with discharge after discussing the patient's care plan.  She reports that the patient has an upcoming carotid endarterectomy not yet scheduled but planned within the next few weeks.  Physical exam General: Well-appearing male in no acute distress. HEENT: Oral thrush on tongue, hard palate, and in retropharynx. Cracked, dry lips. Dry mucous membranes. Cardiovascular: RRR.  No rubs/murmurs/gallops.  Normal S1/S2. Respiratory: Lungs CTAB.  Normal WOB on room air. Abdominal: Nontender to palpation and nondistended.  No rebound or guarding. Extremities: No cyanosis/clubbing/edema.  2+ radial and pedal pulses bilaterally.  Capillary refill less than 2 seconds. Skin: Warm and dry.  No obvious rashes. Psych: Full range affect.  Pleasant and appropriate.  Makes occasional jokes.

## 2022-02-06 NOTE — Discharge Instructions (Addendum)
You were hospitalized for confusion and found to have a urinary tract infection. Thank you for allowing [place] to be part of your care.   [facility]26/20[facility] appointment.   Please note these changes made to your medications:   Please START taking:  Bactrim 800/160 mg twice a day for 10 days with the last dose being on the evening of August 11 or the morning of August 12. Nystatin swish and swallow 4 times a day for the next 7 to 14 days. Ellipta 1 puff a day   Please STOP taking:  Jardiance, and discussed this with your primary care physician and cardiologist at your next appointments. Spiriva (which has been replaced with the Ellipta)   Please make sure to keep your follow-up with your primary care physician and contact your cardiologist to ensure they are aware of the changes in your medications and your recent hospitalizations.  We will continue you on the nortriptyline but please discuss alternative medications with your primary care physician that may have less central nervous system effects.  Please also call your primary care physician if you need an alternative medication sooner. If you have any new or worsening symptoms please return to the emergency department.  Please call our clinic if you have any questions or concerns, we may be able to help and keep you from a long and expensive emergency room wait. Our clinic and after hours phone number is [phone], the best time to call is Monday through Friday 9 am to 4 pm but there is always someone available 24/7 if you have an emergency. If you need medication refills please notify your pharmacy one week in advance and they will send [place] a request.

## 2022-02-06 NOTE — Discharge Summary (Signed)
Name: Mason Patton MRN: [id number] DOB: [birth year] 76 y.o. PCP: Marlowe Shores, MD  Date of Admission: 02/05/2022 10:36 PM Date of Discharge: 02/06/2022 12:16 PM Attending Physician: No att. providers found  Discharge Diagnosis: 1. Principal Problem:   Fever and chills Active Problems:   Malaise and fatigue  Urinary tract infection  Discharge Medications: Allergies as of 02/06/2022       Reactions   Codeine Nausea And Vomiting   Demerol Nausea And Vomiting   Spiriva Respimat [tiotropium Bromide Monohydrate] Other (See Comments)   Numbness of face and lips; Possible disorientation         Medication List     STOP taking these medications    amoxicillin 500 MG tablet Commonly known as: AMOXIL   empagliflozin 10 MG Tabs tablet Commonly known as: Jardiance       TAKE these medications    apixaban 5 MG Tabs tablet Commonly known as: ELIQUIS Take 5 mg by mouth 2 (two) times daily.   aspirin EC 81 MG tablet Take 1 tablet (81 mg total) by mouth daily. Swallow whole.   atorvastatin 40 MG tablet Commonly known as: LIPITOR Take 40 mg by mouth daily in the afternoon.   Centrum Silver 50+Men Tabs Take 1 tablet by mouth daily with breakfast.   PreserVision AREDS Tabs Take 1 tablet by mouth in the morning and at bedtime.   cholecalciferol 25 MCG (1000 UNIT) tablet Commonly known as: VITAMIN D3 Take 1,000 Units by mouth daily.   Denta 5000 Plus 1.1 % Crea dental cream Generic drug: sodium fluoride Place 1 Application onto teeth at bedtime.   Entresto 49-51 MG Generic drug: sacubitril-valsartan Take 1 tablet by mouth 2 (two) times daily.   levothyroxine 200 MCG tablet Commonly known as: Synthroid Take 1 tablet (200 mcg total) by mouth daily.   metFORMIN 500 MG tablet Commonly known as: GLUCOPHAGE Take 1,000 mg by mouth 2 (two) times daily with a meal.   metoprolol succinate 100 MG 24 hr tablet Commonly known as: TOPROL-XL Take 1 tablet (100  mg total) by mouth daily. Take with or immediately following a meal.   nitroGLYCERIN 0.4 MG SL tablet Commonly known as: NITROSTAT Place 0.4 mg under the tongue every 5 (five) minutes as needed for chest pain.   nortriptyline 10 MG capsule Commonly known as: PAMELOR Take 10 mg by mouth See admin instructions. Take 10 mg by mouth 30-45 minutes prior to bedtime for overactive intestinal muscles   nystatin 100000 UNIT/ML suspension Commonly known as: MYCOSTATIN Use as directed 5 mLs (500,000 Units total) in the mouth or throat 4 (four) times daily.   omeprazole 20 MG capsule Commonly known as: PRILOSEC Take 20 mg by mouth daily before breakfast.   spironolactone 25 MG tablet Commonly known as: ALDACTONE Take 0.5 tablets (12.5 mg total) by mouth daily.   sulfamethoxazole-trimethoprim 800-160 MG tablet Commonly known as: BACTRIM DS Take 1 tablet by mouth 2 (two) times daily.   torsemide 20 MG tablet Commonly known as: DEMADEX Take 20 mg by mouth See admin instructions. Take 20 mg by mouth in the morning and may take an additional 20 mg once a day as directed for a period of 3 days, for an overnight weight gain of 3 to 5 pounds   umeclidinium bromide 62.5 MCG/ACT Aepb Commonly known as: INCRUSE ELLIPTA Inhale 1 puff into the lungs daily. Start taking on: February 07, 2022   Ventolin HFA 108 (90 Base) MCG/ACT inhaler Generic drug: albuterol  Inhale 1-2 puffs into the lungs every 4 (four) hours as needed for wheezing or shortness of breath.   Vitamin B12 500 MCG Tabs Take 500 mcg by mouth 2 (two) times daily.        Disposition and follow-up:   Mason Patton was discharged from [facility] in Good condition.  At the hospital follow up visit please address:  1.  Please address his response to the antibiotics for his urinary tract infection.  He is also address his respiratory status after we changed Spiriva to Ellipta.  We also treated him for thrush with  some nystatin swish and swallow so this will need to be reexamined.  Please also consider any medication alternatives to nortriptyline as this may have some effect on his transient encephalopathy.  Also discontinued his Jardiance due to his recurrent urinary tract infections so we may need other medications adjusted to compensate for this.  2.  Labs / imaging needed at time of follow-up: CBC, BMP  3.  Pending labs/ test needing follow-up: Urine and blood cultures  Follow-up Appointments:  [facility] will need to be seen at an earlier date and they will contact him when they have found a spot.  Hospital Course by problem list:  Mason Patton is a 76 y.o. male with PMH aortic stenosis s/p AVR, COPD, OSA on CPAP, afib on Eliquis, T2DM, HTN, CAD status post CABG, frequent urinary infections, MRSE bacteremia, and prior hospitalization 3 days ago for suspected bacteremia. He presented to the ED with generalized weakness, confusion, and fever and was readmitted for fever of unknown origin and treated for UTI.  UTI Multifactorial acute encephalopathy Patient presented from home following onset of weakness, confusion, and fever.  He was alert and oriented x3 with mild attention deficits. Of note, patient had a hospitalization 3 days prior following dental procedure with 1 out of 2 cultures positive for Staph hominis which was likely a contaminant. At the time, he was treated with IV antibiotics and then discharged on course of oral amoxicillin, which he did complete prior to readmission.  Repeat cultures during that hospitalization were negative.  Patient at high risk for staph endocarditis given his mechanical valve, but TTE last hospitalization was negative for endocarditis, no TEE obtained this admission.  Patient on started on IV ceftriaxone and vancomycin for broad-spectrum coverage in ED. Initial group A  strep and COVID-negative.  Chest x-ray unrevealing for infectious etiology.  UA showed p large amount of leukocytes, bacteria, and white blood cells.  Urine cultures obtained and pending.  He was treated for UTI by narrowing antibiotics to 10 day course of oral Bactrim on discharge stop date of February 16, 2022.  Blood cultures and urine cultures pending with no growth at <24 hours at time of discharge.  Electrolytes and serum glucose values remained normal this admission.  COPD Tobacco use disorder Patient's wife reported that patient had episode of lip numbness with tingling and possible confusion following first dose of Spiriva inhaler after last admission.  Spiriva discontinued and patient tried on Ellipta maintenance inhaler prior to discharge.  Patient experienced no adverse events more than 4 hours after Ellipta administration.  Patient has albuterol inhaler at home and continues to use it as appropriate.  Patient is 25+ pack year smoker and continues to smoke at this time.  He is not interested in smoking  station counseling at this time.  He does not require oxygen at baseline and was discharged without oxygen.  HFimpEF Aortic stenosis status post AVR HTN During previous admission, TTE on 02/01/2022 showed EF of 50 to 55% with intact bioprosthetic AVR. SBPs ranged from 130s to 160s this admission.  Creatinine remained within normal limits.  Patient continued on his GDMT this admission, but [place] Pepper was held and altogether discontinued at discharge due to recurrent issues with infections.  T2DM Oral candidiasis A1c was 5.7 on 02/01/2022 during last admission.  Discontinued patent's Jardiance as above and as well as thrush found on physical exam. Patient maintained on SSI ACHS during this admission and discharged on home Metformin dose of 1,000 mg BID.  Start patient on 7-14 days of swish and swallow nystatin suspension 5 mL 4 times a day for oral candidiasis.  A-fib on Eliquis Patient arrived in  A-fib and remained in A-fib with stable heart rate in the 70s to 80s during much of his admission.  He was continued on home metoprolol succinate 100 mg daily and home Eliquis 5 mg twice daily.  CAD status post CABG HLD Patient underwent CABG in March 2022.  LDL on 02/01/2022 was 46.  Patient continued on home aspirin 81 mg daily and home Lipitor 40 mg daily.  Hypothyroidism TSH was within normal limits on admission and patient was not bradycardic or hypotensive. Continued home Synthroid 200 mg daily.  GERD Patient continued on Protonix 40 mg this admission. He did not report any reflux symptoms.  OSA Patient continued on nightly CPAP this admission and tolerated the therapy well. He continues to request CPAP during the day if he is lying in bed.  Discharge Subjective: This morning, the patient appears well and states that he is feeling much better than he was before.  He states that he is feeling stronger and earlier stood up unassisted in his room.  This morning, the patient ate breakfast.  Reports that his mouth feels dry. He denies any shortness of breath, chest pain, fevers, chills, dysuria, urinary urgency, abdominal pain, confusion, or cough.  The patient's wife arrived at bedside in the afternoon.  She notes the patient has some occasional confusion at home at baseline and he is currently back to baseline as far she can tell.  She felt comfortable with discharge after discussing the patient's care plan.  She reports that the patient has an upcoming carotid endarterectomy not yet scheduled but planned within the next few weeks.  Discharge Exam:   BP 97/73 (BP Location: Left Arm)   Pulse 86   Temp (!) 97.5 F (36.4 C) (Oral)   Resp 18   SpO2 96%  Discharge exam:  General: Well-appearing male in no acute distress. HEENT: Oral thrush on tongue, hard palate, and in retropharynx. Cracked, dry lips. Dry mucous membranes. Cardiovascular: RRR.  No rubs/murmurs/gallops.  Normal  S1/S2. Respiratory: Lungs CTAB.  Normal WOB on room air. Abdominal: Nontender to palpation and nondistended.  No rebound or guarding. Extremities: No cyanosis/clubbing/edema.  2+ radial and pedal pulses bilaterally.  Capillary refill less than 2 seconds. Skin: Warm and dry.  No obvious rashes. Psych: Full range affect.  Pleasant and appropriate.  Makes occasional jokes.  Pertinent Labs, Studies, and Procedures:     Latest Ref Rng & Units 02/06/2022    3:20 AM 02/05/2022   11:40 PM 02/03/2022    1:29 AM  CBC  WBC 4.0 - 10.5 K/uL 11.1  11.4  11.0   Hemoglobin  13.0 - 17.0 g/dL 79.8  92.1  19.4   Hematocrit 39.0 - 52.0 % 39.6  40.6  42.6   Platelets 150 - 400 K/uL 171  177  149       Discharge Instructions: Discharge Instructions     Call MD for:  difficulty breathing, headache or visual disturbances   Complete by: As directed    Call MD for:  extreme fatigue   Complete by: As directed    Call MD for:  hives   Complete by: As directed    Call MD for:  persistant dizziness or light-headedness   Complete by: As directed    Call MD for:  persistant nausea and vomiting   Complete by: As directed    Call MD for:  severe uncontrolled pain   Complete by: As directed    Call MD for:  temperature >100.4   Complete by: As directed    Diet - low sodium heart healthy   Complete by: As directed    Increase activity slowly   Complete by: As directed        Signed: Rocky Morel, DO 02/06/2022, 1:23 PM   Pager: [phone]

## 2022-02-06 NOTE — Progress Notes (Signed)
Pharmacy Antibiotic Note  Mason Patton is a 76 y.o. male admitted on 02/05/2022 with sepsis.  Pharmacy has been consulted for vancomycin and cefepime dosing. Patient recently discharged on 7/30 on amoxicillin x 7 day course for staph hominis bacteremia per ID recommendation though thought contaminant likely but opted for treatment in setting of oral manipulations, bioprosthetic aortic valve and history MRSE.   Plan: Cefepime 2g x1 followed by cefepime 2g q8h Vancomycin 2000mg  x1 loading dose followed by vancomycin vancomycin 1250mg  q24h (eAUC 465 using Scr 1.15 and Vd 0.5) Obtain vancomycin levels as appropriate Monitor renal function, cultures, and clinical progression    Temp (24hrs), Avg:99 F (37.2 C), Min:99 F (37.2 C), Max:99 F (37.2 C)  Recent Labs  Lab 01/31/22 0850 02/01/22 0404 02/02/22 0052 02/03/22 0129 02/05/22 2340  WBC 9.0 9.5 8.2 11.0* 11.4*  CREATININE 1.03 0.86 1.07 0.98  --   LATICACIDVEN 1.6  --   --   --  1.5    Estimated Creatinine Clearance: 77.8 mL/min (by C-G formula based on SCr of 0.98 mg/dL).    Allergies  Allergen Reactions   Codeine Nausea And Vomiting   Demerol Nausea And Vomiting    Antimicrobials this admission: Vancomycin 8/2 >>  Cefepime 8/2 >> Metronidazole x1 8/2   Dose adjustments this admission:   Microbiology results: 8/2 bcx:   Thank you for allowing pharmacy to be a part of this patient's care.  02/05/22, PharmD, BCPS Clinical Pharmacist 02/06/2022 12:51 AM

## 2022-02-07 LAB — URINE CULTURE: Culture: 20000 — AB

## 2022-02-07 LAB — CULTURE, BLOOD (ROUTINE X 2)
Culture: NO GROWTH
Culture: NO GROWTH

## 2022-02-08 ENCOUNTER — Other Ambulatory Visit: Payer: Self-pay | Admitting: Student

## 2022-02-08 MED ORDER — FLUCONAZOLE 200 MG PO TABS: 200.0000 mg | ORAL_TABLET | Freq: Every day | ORAL | 0 refills | Status: AC

## 2022-02-08 NOTE — Significant Event (Cosign Needed Addendum)
Called the patient's wife to discuss newly resulted urine culture that grew yeast colony.  Explained new findings to patient's wife and significance of UTI treatment.  Notified patient's wife a new course of fluconazole 200 mg p.o. daily for 14 days was sent to patient's [facility] pharmacy.  Confirmed that patient should finish current outpatient course of Bactrim alongside fluconazole.  Inquired about health of patient since discharge and discussed follow-up appointment at [facility].  Patient's wife reports that the patient was able to secure an earlier appointment at the [facility] for Tuesday 02/12/2022 at 8:30 AM.  Also confirmed that patient will be scheduling a cardiology follow-up appointment after primary care visit.  She reports that the patient has been feeling better and eating better at home since discharge.  She does not report any new health concerns.  She notes that the patient was refusing to take medications initially until she warned him that she would not be able to take care of him if he did not listen.  Patient subsequently agreed to take medications and has been keeping up with his treatment course since.  Judene Logue, MS3 02/08/2022, 12:08 PM

## 2022-02-11 LAB — CULTURE, BLOOD (ROUTINE X 2)
Culture: NO GROWTH
Culture: NO GROWTH
Special Requests: ADEQUATE

## 2022-02-12 ENCOUNTER — Telehealth: Payer: Self-pay

## 2022-02-12 NOTE — Telephone Encounter (Signed)
Pt's cardiac clearance has been received, however, [facility] MD states R CEA would need to be done under general anesthesia. I have notified [place], who has made cardiologist aware and we are awaiting cardiologist's advisement on how to proceed.

## 2022-02-20 ENCOUNTER — Telehealth: Payer: Self-pay

## 2022-02-20 NOTE — Telephone Encounter (Signed)
-----   Message from Marguerita Merles, RN sent at 02/12/2022 12:43 PM EDT ----- Regarding: FW: CEA/anesthesia  ----- Message ----- From: Waynetta Sandy, MD Sent: 02/12/2022  12:25 PM EDT To: Marguerita Merles, RN Subject: RE: CEA/anesthesia                             He would need general anesthesia.   Erlene Quan  ----- Message ----- From: Marguerita Merles, RN Sent: 02/12/2022  10:07 AM EDT To: Waynetta Sandy, MD Subject: CEA/anesthesia                                 Good morning, this pt has received his cardiac clearance for his R CEA, however, they recommend MAC. Please advise. Thanks! Izora Gala

## 2022-02-20 NOTE — Telephone Encounter (Signed)
Contacted the [facility] and spoke with charge nurse, Marlowe Kays to follow up on clearance needed for general anesthesia for R CEA. I was informed due to patient's increased cardiac risk, the cardiologist stated they could not clear him for general and would submit a community care referral somewhere that patient can have procedure under MAC. Dr. Donzetta Matters made aware.

## 2022-11-05 IMAGING — DX DG CHEST 2V
2 series · 2 of 2 positions shown · non-contrast
Comparison: 09/16/2020

CLINICAL DATA: Status post coronary bypass grafting

EXAM:
CHEST - 2 VIEW

[dg chest 2 view (1 of 2)]
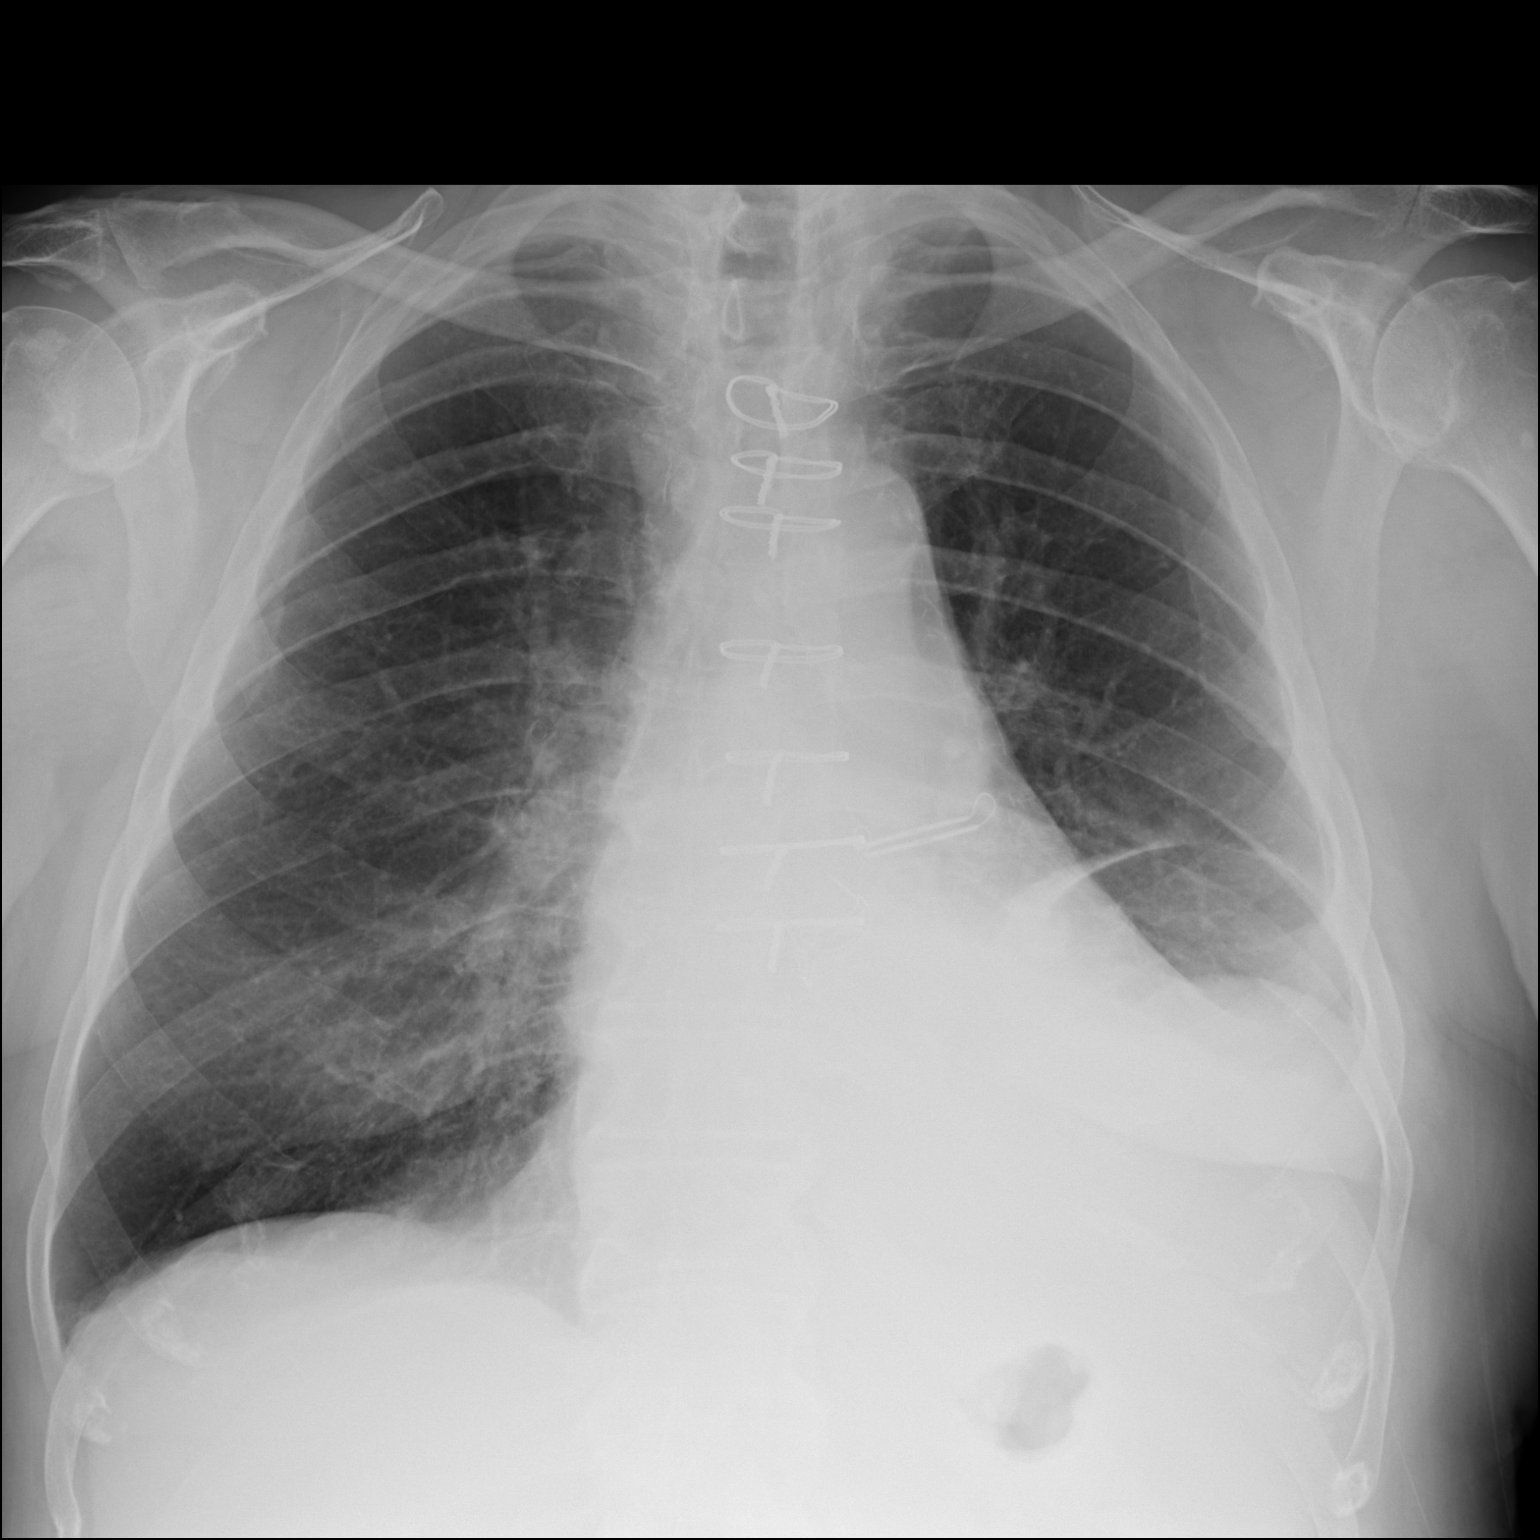

[dg chest 2 view (2 of 2)]
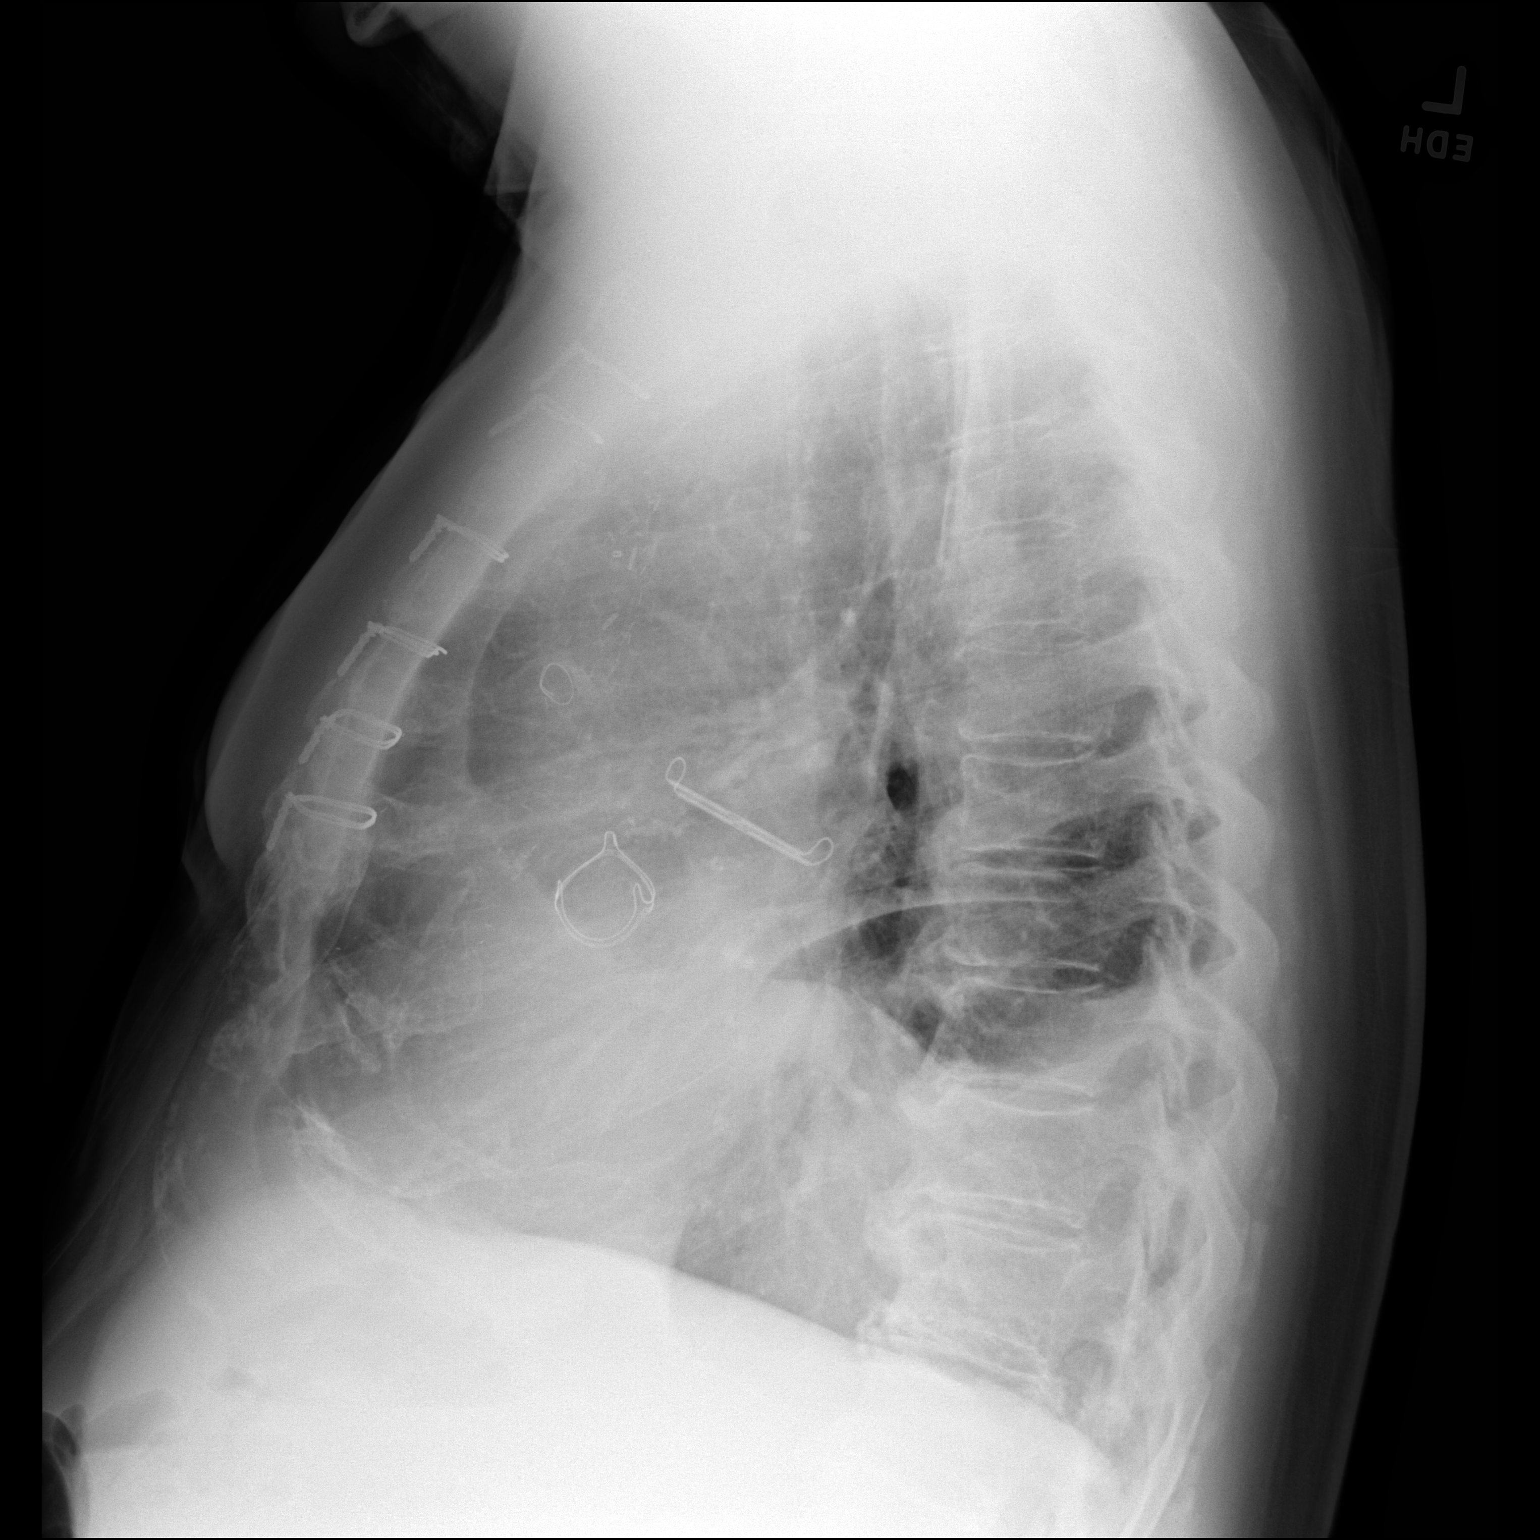

[2 of 2 positions shown; findings below may reference images not displayed]

FINDINGS: Cardiac shadow is enlarged but stable. Slight increase in left-sided
pleural effusion is noted when compared with the prior exam.
Postsurgical changes are again seen. The lungs are otherwise clear.
IMPRESSION: Slight increase in left-sided pleural effusion.

## 2022-11-11 IMAGING — CR DG CHEST 1V
1 series · 1 of 1 positions shown · non-contrast
Comparison: 10/19/2020

CLINICAL DATA: Post left-sided thoracentesis.

EXAM:
CHEST  1 VIEW

[chest pa]
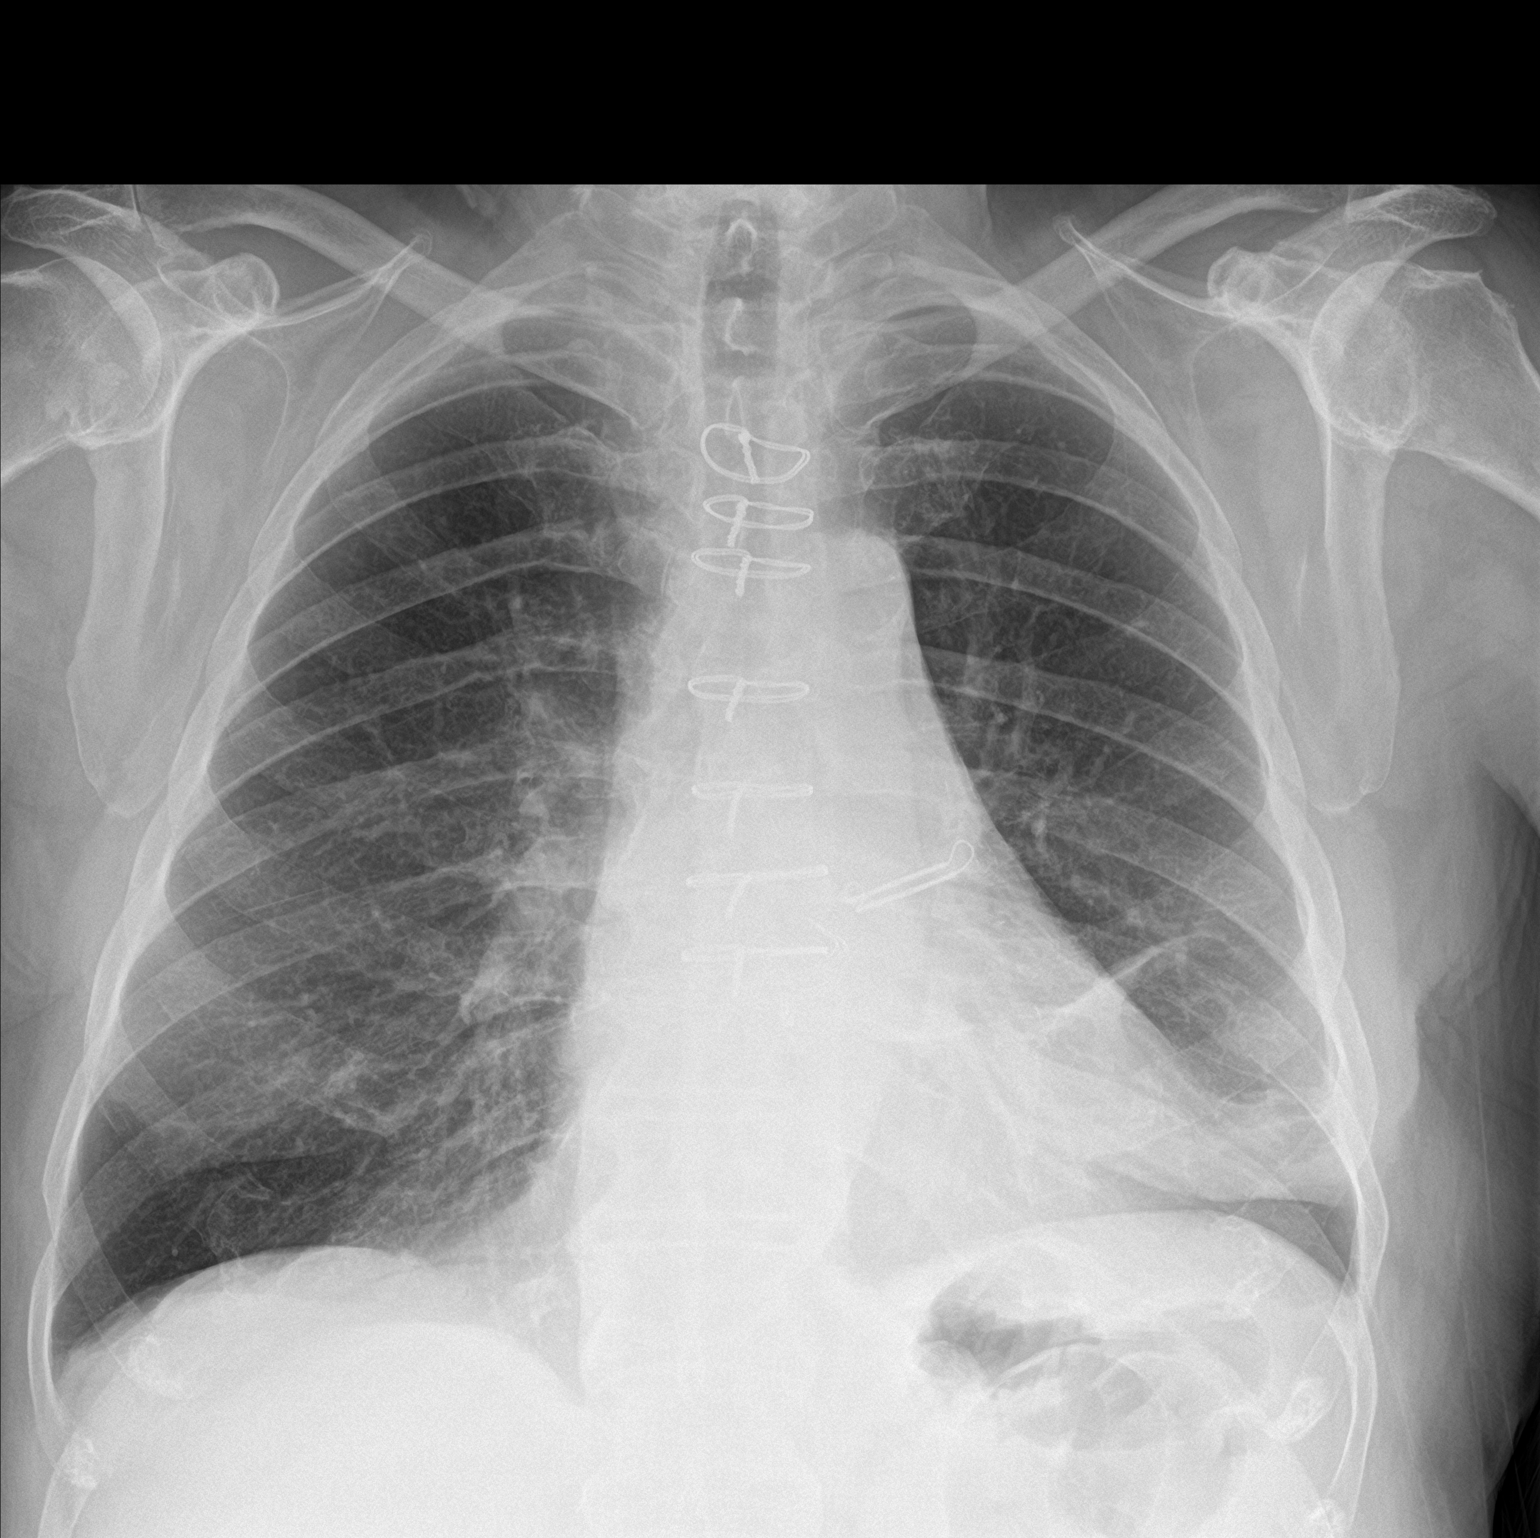

[1 of 1 positions shown; findings below may reference images not displayed]

FINDINGS: Midline trachea. Mild cardiomegaly. Atherosclerosis in the
transverse aorta. Prior median sternotomy. Left atrial appendage
occlusion device. Resolution of left-sided pleural effusion. No
pneumothorax. No congestive failure. Chronic mild interstitial
thickening is likely related to COPD/chronic bronchitis in a smoker.
Presumed scarring within the lingula. Overall improved left base
aeration with significant improvement in volume loss.
IMPRESSION: Resolution of left pleural effusion, without pneumothorax.

Cardiomegaly and chronic interstitial thickening, without
superimposed acute process.

Aortic Atherosclerosis ([id number]).

## 2022-11-11 IMAGING — US IR THORACENTESIS ASP PLEURAL SPACE W/IMG GUIDE
2 series · 4 of 4 positions shown · non-contrast
Comparison: none

INDICATION: Recent coronary artery bypass graft procedure. Now with left pleural
effusion. Request for therapeutic thoracentesis.

[Series 1: ir (id) (id)/(id)/(id) ir · 1 of 1 slices shown (1 of 2)]
[im 1/1]
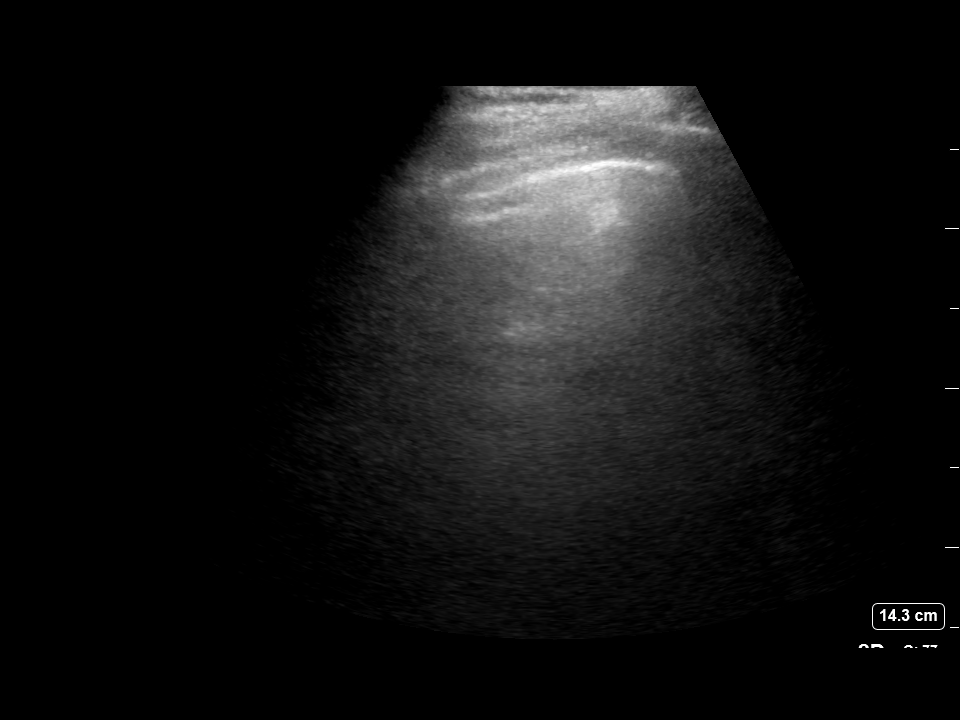

[Series 1: ir (id) (id)/(id)/(id) ir · 3 of 3 slices shown (2 of 2)]
[im 1/3]
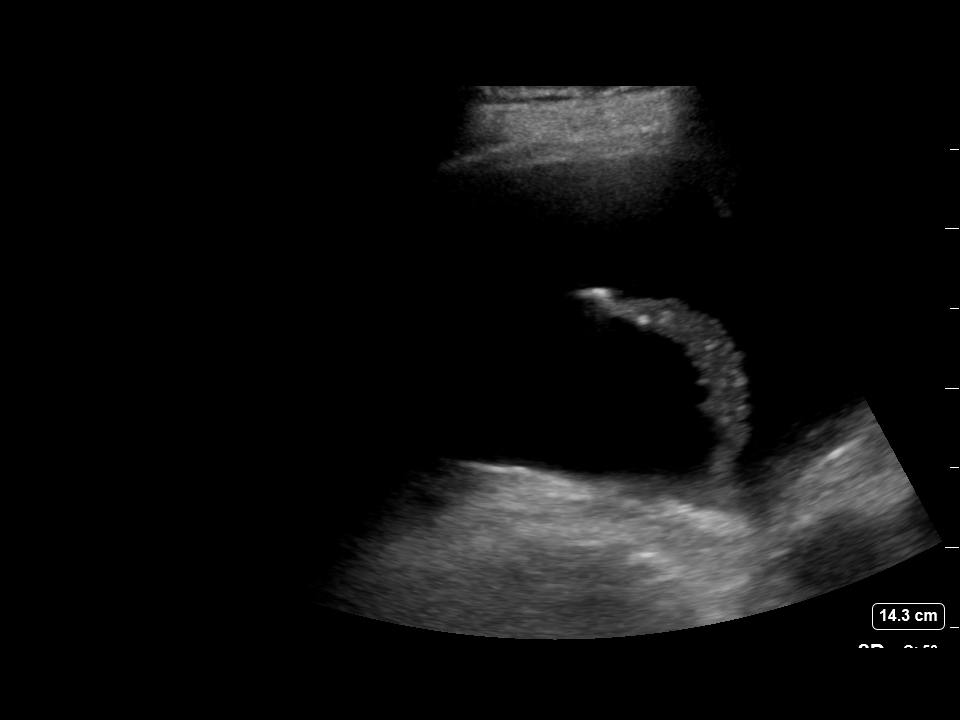
[im 2/3]
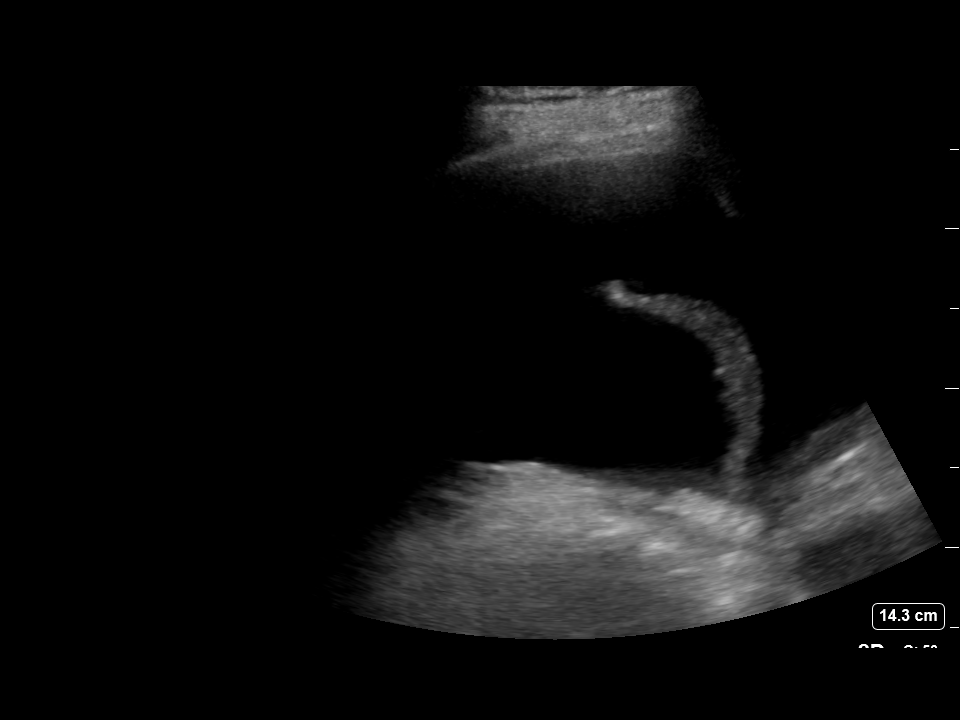
[im 3/3]
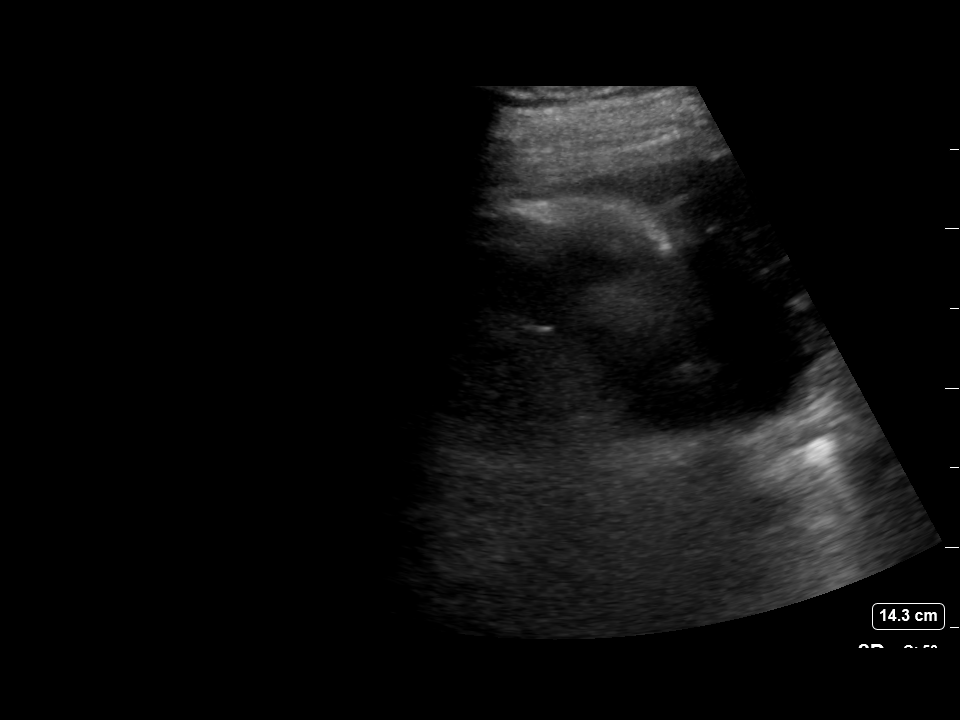

[4 of 4 positions shown; findings below may reference images not displayed]

EXAM:
ULTRASOUND GUIDED LEFT THORACENTESIS

MEDICATIONS:
1% lidocaine 10 mL

COMPLICATIONS:
None immediate.

PROCEDURE:
An ultrasound guided thoracentesis was thoroughly discussed with the
patient and questions answered. The benefits, risks, alternatives
and complications were also discussed. The patient understands and
wishes to proceed with the procedure. Written consent was obtained.

Ultrasound was performed to localize and mark an adequate pocket of
fluid in the left chest. The area was then prepped and draped in the
normal sterile fashion. 1% Lidocaine was used for local anesthesia.
Under ultrasound guidance a 6 Fr Safe-T-Centesis catheter was
introduced. Thoracentesis was performed. The catheter was removed
and a dressing applied.
FINDINGS: A total of approximately 1 L of amber fluid was removed.
IMPRESSION: Successful ultrasound guided left thoracentesis yielding 1 L of
pleural fluid.

No pneumothorax on post-procedure chest x-ray.

## 2024-05-06 ENCOUNTER — Telehealth (HOSPITAL_COMMUNITY): Payer: Self-pay

## 2024-05-06 NOTE — Telephone Encounter (Signed)
 Outside/paper referral received by Dr. Waymond from [facility].   [id number]  Patient confirmed interest in cardiac rehab, states he does PT on Tuesdays and Thursdays which should end in about 4 weeks.  Passing to nurse for review.

## 2024-05-07 ENCOUNTER — Telehealth (HOSPITAL_COMMUNITY): Payer: Self-pay | Admitting: *Deleted

## 2024-05-07 NOTE — Telephone Encounter (Signed)
 Spoke with Mrs Mason Patton is currently receiving physical therapy. Todd would like to complete physical therapy before proceeding with cardiac rehab. Mr Whitcher's physical therapy will last for about 4 weeks. Received cardiac rehab from Dr Waymond. Will obtain physical therapy recommendations when complete and schedule when appropriate. Hadassah Elpidio Quan RN BSN

## 2024-05-14 ENCOUNTER — Telehealth (HOSPITAL_COMMUNITY): Payer: Self-pay

## 2024-05-14 NOTE — Telephone Encounter (Signed)
 Called Bria at the [facility] and advised her that we still havent received this pt EKG and that they sent over the wrong information. Bria stated that she sent over a note to the nurses and advised them to send over the EKG tracing.

## 2024-05-26 ENCOUNTER — Telehealth (HOSPITAL_COMMUNITY): Payer: Self-pay

## 2024-05-26 NOTE — Telephone Encounter (Signed)
 Called patient to see if he was interested in participating in the Cardiac Rehab Program. Patient will come in for orientation on 12/04 and will attend the 10:15 exercise class.  Pensions consultant.

## 2024-06-02 ENCOUNTER — Ambulatory Visit: Payer: Self-pay | Admitting: Physician Assistant

## 2024-06-09 ENCOUNTER — Telehealth (HOSPITAL_COMMUNITY): Payer: Self-pay

## 2024-06-09 NOTE — Telephone Encounter (Signed)
 Confirmed cardiac orientation appointment time of 06/10/24 at 1030.  Cardiac health history completed.

## 2024-06-10 ENCOUNTER — Encounter (HOSPITAL_COMMUNITY)
Admission: RE | Admit: 2024-06-10 | Discharge: 2024-06-10 | Disposition: A | Source: Ambulatory Visit | Attending: Cardiology

## 2024-06-10 ENCOUNTER — Ambulatory Visit: Payer: Self-pay | Admitting: Cardiology

## 2024-06-10 VITALS — BP 120/72 | HR 61 | Ht 69.75 in | Wt 217.6 lb

## 2024-06-10 DIAGNOSIS — I5042 Chronic combined systolic (congestive) and diastolic (congestive) heart failure: Secondary | ICD-10-CM | POA: Diagnosis present

## 2024-06-10 LAB — GLUCOSE, CAPILLARY
Glucose-Capillary: 345 mg/dL — ABNORMAL HIGH (ref 70–99)
Glucose-Capillary: 296 mg/dL — ABNORMAL HIGH (ref 70–99)

## 2024-06-10 NOTE — Progress Notes (Signed)
 Cardiac Rehab Medication Review   Does the patient  feel that his/her medications are working for him/her?  yes  Has the patient been experiencing any side effects to the medications prescribed?  no  Does the patient measure his/her own blood pressure or blood glucose at home?  yes   Does the patient have any problems obtaining medications due to transportation or finances?   no  Understanding of regimen: fair Understanding of indications: fair Potential of compliance: excellent    Comments: Spouse provides and manages medications at home.    Alm Parkins 06/10/2024 11:57 AM

## 2024-06-10 NOTE — Progress Notes (Signed)
 Cardiac Individual Treatment Plan  Patient Details  Name: Mason Patton MRN: [id number] Date of Birth: 1945/07/09 Referring Provider:   Flowsheet Row INTENSIVE CARDIAC REHAB ORIENT from 06/10/2024 in [facility] for Heart, Vascular, & Lung Health  Referring Provider Dr. Edda Holland, MD covering)    Initial Encounter Date:  Flowsheet Row INTENSIVE CARDIAC REHAB ORIENT from 06/10/2024 in [facility] for Heart, Vascular, & Lung Health  Date 06/10/24    Visit Diagnosis: Heart failure, systolic and diastolic, chronic (HCC)  Patient's Home Medications on Admission:  Current Outpatient Medications:    apixaban  (ELIQUIS ) 5 MG TABS tablet, Take 5 mg by mouth 2 (two) times daily., Disp: , Rfl:    aspirin  EC 81 MG EC tablet, Take 1 tablet (81 mg total) by mouth daily. Swallow whole., Disp: 30 tablet, Rfl: 11   atorvastatin  (LIPITOR) 40 MG tablet, Take 40 mg by mouth daily in the afternoon. (Patient taking differently: Take 80 mg by mouth daily in the afternoon.), Disp: , Rfl:    cholecalciferol  (VITAMIN D3) 25 MCG (1000 UNIT) tablet, Take 1,000 Units by mouth daily. (Patient taking differently: Take 2,000 Units by mouth daily.), Disp: , Rfl:    Cyanocobalamin  (VITAMIN B12) 500 MCG TABS, Take 500 mcg by mouth 2 (two) times daily., Disp: , Rfl:    DENTA 5000 PLUS 1.1 % CREA dental cream, Place 1 Application onto teeth at bedtime. (Patient taking differently: Place 1 Application onto teeth at bedtime.), Disp: , Rfl:    ezetimibe (ZETIA) 10 MG tablet, Take 10 mg by mouth daily., Disp: , Rfl:    insulin  glargine (LANTUS ) 100 UNIT/ML injection, Inject 12 Units into the skin daily., Disp: , Rfl:    levothyroxine  (SYNTHROID ) 200 MCG tablet, Take 1 tablet (200 mcg total) by mouth daily., Disp: 30 tablet, Rfl: 0   metoprolol  succinate (TOPROL -XL) 100 MG 24 hr tablet, Take 1 tablet (100 mg total) by mouth daily. Take with or immediately following a meal.  (Patient taking differently: Take 200 mg by mouth daily. Take with or immediately following a meal.), Disp: 30 tablet, Rfl: 11   Multiple Vitamins-Minerals (CENTRUM SILVER 50+MEN) TABS, Take 1 tablet by mouth daily with breakfast., Disp: , Rfl:    Multiple Vitamins-Minerals (PRESERVISION AREDS) TABS, Take 1 tablet by mouth in the morning and at bedtime., Disp: , Rfl:    nitroGLYCERIN  (NITROSTAT ) 0.4 MG SL tablet, Place 0.4 mg under the tongue every 5 (five) minutes as needed for chest pain., Disp: , Rfl:    nortriptyline (PAMELOR) 10 MG capsule, Take 10 mg by mouth See admin instructions. Take 10 mg by mouth 30-45 minutes prior to bedtime for overactive intestinal muscles, Disp: , Rfl:    omeprazole (PRILOSEC) 20 MG capsule, Take 20 mg by mouth daily before breakfast., Disp: , Rfl:    potassium chloride  SA (KLOR-CON  M) 20 MEQ tablet, Take 20 mEq by mouth 2 (two) times daily., Disp: , Rfl:    sacubitril -valsartan  (ENTRESTO ) 49-51 MG, Take 1 tablet by mouth 2 (two) times daily., Disp: 60 tablet, Rfl: 5   spironolactone  (ALDACTONE ) 25 MG tablet, Take 0.5 tablets (12.5 mg total) by mouth daily. (Patient taking differently: Take 25 mg by mouth daily.), Disp: 45 tablet, Rfl: 3   torsemide  (DEMADEX ) 20 MG tablet, Take 20 mg by mouth See admin instructions. Take 20 mg by mouth in the morning and may take an additional 20 mg once a day as directed for a period of 3  days, for an overnight weight gain of 3 to 5 pounds, Disp: 72 tablet, Rfl: 0   VENTOLIN  HFA 108 (90 Base) MCG/ACT inhaler, Inhale 1-2 puffs into the lungs every 4 (four) hours as needed for wheezing or shortness of breath. , Disp: , Rfl:    metFORMIN  (GLUCOPHAGE ) 500 MG tablet, Take 1,000 mg by mouth 2 (two) times daily with a meal. (Patient not taking: Reported on 06/10/2024), Disp: , Rfl:    nystatin  (MYCOSTATIN ) 100000 UNIT/ML suspension, Use as directed 5 mLs (500,000 Units total) in the mouth or throat 4 (four) times daily. (Patient not taking:  Reported on 06/10/2024), Disp: 56 mL, Rfl: 0   sulfamethoxazole -trimethoprim  (BACTRIM  DS) 800-160 MG tablet, Take 1 tablet by mouth 2 (two) times daily. (Patient not taking: Reported on 06/10/2024), Disp: 20 tablet, Rfl: 0   umeclidinium bromide  (INCRUSE ELLIPTA ) 62.5 MCG/ACT AEPB, Inhale 1 puff into the lungs daily. (Patient not taking: Reported on 06/10/2024), Disp: 30 each, Rfl: 2  Past Medical History: Past Medical History:  Diagnosis Date   Aortic stenosis    09/30/19 echo ([facility], Cardiologist Dr. 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)   Meningoencephalitis    Obesity    OSA on CPAP    last sleep study >8 yrs   Pneumonia    twice (05/19/2013)   Shortness of breath    Stroke [name]    they said I had a minor one when I had menigitis (05/19/2013)   Thyroid  disease    Tobacco abuse    quit 11/08/2012   Type II diabetes mellitus (HCC)    Umbilical hernia     Tobacco Use: Social History   Tobacco Use  Smoking Status Every Day   Current packs/day: 0.50   Average packs/day: 0.5 packs/day for 50.0 years (25.0 ttl pk-yrs)   Types: Cigarettes   Passive exposure: Never  Smokeless Tobacco Never    Labs: Review Flowsheet  More data exists      Latest Ref Rng & Units 09/07/2020 09/11/2020 11/05/2020 05/18/2021 02/01/2022  Labs for ITP Cardiac and Pulmonary Rehab  Cholestrol 0 - 200 mg/dL - - - - 82   LDL (calc) 0 - 99 mg/dL - - - - 46    HDL-C >59 mg/dL - - - - 20   Trlycerides <150 mg/dL - - - - 78   Hemoglobin A1c 4.8 - 5.6 % - - - 6.4  5.7   PH, Arterial 7.350 - 7.450 7.454  7.438  7.428  7.422  7.381  7.333  7.392  7.474  7.319  7.374  - - -  PCO2 arterial 32.0 - 48.0 mmHg 41.3  34.0  34.1  35.1  40.1  49.3  45.1  37.2  53.9  45.4  - - -  Bicarbonate 20.0 - 28.0 mmol/L 29.0  30.0  29.7  23.1  22.6  22.9  23.8  26.2  27.5  27.3  27.7  26.5  - - -  TCO2 22 - 32 mmol/L 30  31  31  24  24  24  25  26  28  27  25  29  26  28  27  29  28  29  27  26   - -  Acid-base deficit 0.0 - 2.0 mmol/L - 1.0  2.0  1.0  1.0  - - -  O2 Saturation % 99.0  57.0  56.0  96.0  94.0  94.0  93.0  100.0  100.0  100.0  84.0  100.0  - - -    Details       Multiple values from one day are sorted in reverse-chronological order         Capillary Blood Glucose: Lab Results  Component Value Date   GLUCAP 296 (H) 06/10/2024   GLUCAP 345 (H) 06/10/2024   GLUCAP 138 (H) 02/06/2022   GLUCAP 131 (H) 02/06/2022   GLUCAP 126 (H) 05/19/2021     Exercise Target Goals: Exercise Program Goal: Individual exercise prescription set using results from initial 6 min walk test and THRR while considering  patient's activity barriers and safety.   Exercise Prescription Goal: Initial exercise prescription builds to 30-45 minutes a day of aerobic activity, 2-3 days per week.  Home exercise guidelines will be given to patient during program as part of exercise prescription that the participant will acknowledge.  Activity Barriers & Risk Stratification:  Activity Barriers & Cardiac Risk Stratification - 06/10/24 1419       Activity Barriers & Cardiac Risk Stratification   Activity Barriers Left Knee Replacement;Right Knee Replacement;Deconditioning;Balance Concerns;Assistive Device;Back Problems;Decreased Ventricular Function;Other (comment)    Comments Neuropathy in the feet    Cardiac Risk Stratification High          6 Minute Walk:  6 Minute Walk      Row Name 06/10/24 1245         6 Minute Walk   Phase Initial  Pt used rollator     Distance 1080 feet     Walk Time 6 minutes     # of Rest Breaks 0     MPH 2.05     METS 1.73     RPE 9     Perceived Dyspnea  0     VO2 Peak 6.05     Symptoms Yes (comment)     Comments SOB, RPD = 1, resolved with rest     Resting HR 72 bpm     Resting BP 120/72     Resting Oxygen Saturation  99 %     Exercise Oxygen Saturation  during 6 min walk 100 %     Max Ex. HR 84 bpm     Max Ex. BP 132/76     2 Minute Post BP 116/70        Oxygen Initial Assessment:   Oxygen Re-Evaluation:   Oxygen Discharge (Final Oxygen Re-Evaluation):   Initial Exercise Prescription:  Initial Exercise Prescription - 06/10/24 1400       Date of Initial Exercise RX and Referring Provider   Date 06/10/24    Referring Provider Dr. Edda Holland, MD covering)    Expected Discharge Date 09/01/24      NuStep   Level 1    SPM 75    Minutes 25      Prescription Details   Frequency (times per week) 3  Duration Progress to 30 minutes of continuous aerobic without signs/symptoms of physical distress      Intensity   THRR 40-80% of Max Heartrate 57-114    Ratings of Perceived Exertion 11-13    Perceived Dyspnea 0-4      Progression   Progression Continue progressive overload as per policy without signs/symptoms or physical distress.      Resistance Training   Training Prescription Yes    Weight 3 lbs    Reps 10-15          Perform Capillary Blood Glucose checks as needed.  Exercise Prescription Changes:   Exercise Comments:   Exercise Goals and Review:   Exercise Goals     Row Name 06/10/24 1435             Exercise Goals   Increase Physical Activity Yes       Intervention Provide advice, education, support and counseling about physical activity/exercise needs.;Develop an individualized exercise prescription for aerobic and resistive training based on initial evaluation  findings, risk stratification, comorbidities and participant's personal goals.       Expected Outcomes Short Term: Attend rehab on a regular basis to increase amount of physical activity.;Long Term: Exercising regularly at least 3-5 days a week.;Long Term: Add in home exercise to make exercise part of routine and to increase amount of physical activity.       Increase Strength and Stamina Yes       Intervention Provide advice, education, support and counseling about physical activity/exercise needs.;Develop an individualized exercise prescription for aerobic and resistive training based on initial evaluation findings, risk stratification, comorbidities and participant's personal goals.       Expected Outcomes Short Term: Increase workloads from initial exercise prescription for resistance, speed, and METs.;Short Term: Perform resistance training exercises routinely during rehab and add in resistance training at home;Long Term: Improve cardiorespiratory fitness, muscular endurance and strength as measured by increased METs and functional capacity ( )       Able to understand and use rate of perceived exertion (RPE) scale Yes       Intervention Provide education and explanation on how to use RPE scale       Expected Outcomes Short Term: Able to use RPE daily in rehab to express subjective intensity level;Long Term:  Able to use RPE to guide intensity level when exercising independently       Knowledge and understanding of Target Heart Rate Range (THRR) Yes       Intervention Provide education and explanation of THRR including how the numbers were predicted and where they are located for reference       Expected Outcomes Short Term: Able to state/look up THRR;Long Term: Able to use THRR to govern intensity when exercising independently;Short Term: Able to use daily as guideline for intensity in rehab       Understanding of Exercise Prescription Yes       Intervention Provide education, explanation, and  written materials on patient's individual exercise prescription       Expected Outcomes Short Term: Able to explain program exercise prescription;Long Term: Able to explain home exercise prescription to exercise independently          Exercise Goals Re-Evaluation :   Discharge Exercise Prescription (Final Exercise Prescription Changes):   Nutrition:  Target Goals: Understanding of nutrition guidelines, daily intake of sodium 1500mg , cholesterol 200mg , calories 30% from fat and 7% or less from saturated fats, daily to have 5 or more servings of fruits  and vegetables.  Biometrics:  Pre Biometrics - 06/10/24 1145       Pre Biometrics   Waist Circumference 45 inches    Hip Circumference 44 inches    Waist to Hip Ratio 1.02 %    Triceps Skinfold 17 mm    % Body Fat 32.9 %    Grip Strength 16 kg    Flexibility --   pt was uncomfortable geting on table, not performed   Single Leg Stand 2.3 seconds           Nutrition Therapy Plan and Nutrition Goals:   Nutrition Assessments:  MEDIFICTS Score Key: >=70 Need to make dietary changes  40-70 Heart Healthy Diet <= 40 Therapeutic Level Cholesterol Diet   Flowsheet Row CARDIAC REHAB PHASE II EXERCISE from 02/16/2021 in [facility] for Heart, Vascular, & Lung Health  Picture Your Plate Total Score on Admission 50  Picture Your Plate Total Score on Discharge 62   Picture Your Plate Scores: <59 Unhealthy dietary pattern with much room for improvement. 41-50 Dietary pattern unlikely to meet recommendations for good health and room for improvement. 51-60 More healthful dietary pattern, with some room for improvement.  >60 Healthy dietary pattern, although there may be some specific behaviors that could be improved.    Nutrition Goals Re-Evaluation:   Nutrition Goals Re-Evaluation:   Nutrition Goals Discharge (Final Nutrition Goals Re-Evaluation):   Psychosocial: Target Goals: Acknowledge presence  or absence of significant depression and/or stress, maximize coping skills, provide positive support system. Participant is able to verbalize types and ability to use techniques and skills needed for reducing stress and depression.  Initial Review & Psychosocial Screening:  Initial Psych Review & Screening - 06/10/24 1412       Initial Review   Current issues with Current Sleep Concerns      Family Dynamics   Good Support System? --   Pt's wife with him today     Barriers   Psychosocial barriers to participate in program There are no identifiable barriers or psychosocial needs.      Screening Interventions   Interventions Encouraged to exercise    Expected Outcomes Short Term goal: Identification and review with participant of any Quality of Life or Depression concerns found by scoring the questionnaire.;Long Term goal: The participant improves quality of Life and PHQ9 Scores as seen by post scores and/or verbalization of changes          Quality of Life Scores:  Quality of Life - 06/10/24 1428       Quality of Life   Select Quality of Life      Quality of Life Scores   Health/Function Pre 16.67 %    Socioeconomic Pre 21.67 %    Psych/Spiritual Pre 17.21 %    Family Pre 27.6 %    GLOBAL Pre 19.67 %         Scores of 19 and below usually indicate a poorer quality of life in these areas.  A difference of  2-3 points is a clinically meaningful difference.  A difference of 2-3 points in the total score of the Quality of Life Index has been associated with significant improvement in overall quality of life, self-image, physical symptoms, and general health in studies assessing change in quality of life.  PHQ-9: Review Flowsheet       06/10/2024 02/28/2021 01/22/2021 04/20/2013  Depression screen PHQ 2/9  Decreased Interest 0 0 0 0  Down, Depressed, Hopeless 0 0 0  0  PHQ - 2 Score 0 0 0 0  Altered sleeping 3 - 0 -  Tired, decreased energy 3 - 1 -  Change in appetite 0 - 0  -  Feeling bad or failure about yourself  0 - 0 -  Trouble concentrating 0 - 0 -  Moving slowly or fidgety/restless 0 - 0 -  Suicidal thoughts 0 - 0 -  PHQ-9 Score 6 - 1  -  Difficult doing work/chores Not difficult at all - Not difficult at all -    Details       Data saved with a previous flowsheet row definition        Interpretation of Total Score  Total Score Depression Severity:  1-4 = Minimal depression, 5-9 = Mild depression, 10-14 = Moderate depression, 15-19 = Moderately severe depression, 20-27 = Severe depression   Psychosocial Evaluation and Intervention:   Psychosocial Re-Evaluation:   Psychosocial Discharge (Final Psychosocial Re-Evaluation):   Vocational Rehabilitation: Provide vocational rehab assistance to qualifying candidates.   Vocational Rehab Evaluation & Intervention:  Vocational Rehab - 06/10/24 1416       Initial Vocational Rehab Evaluation & Intervention   Assessment shows need for Vocational Rehabilitation --   Pt is retired         Education: Education Goals: Education classes will be provided on a weekly basis, covering required topics. Participant will state understanding/return demonstration of topics presented.     Core Videos: Exercise    Move It!  Clinical staff conducted group or individual video education with verbal and written material and guidebook.  Patient learns the recommended Pritikin exercise program. Exercise with the goal of living a long, healthy life. Some of the health benefits of exercise include controlled diabetes, healthier blood pressure levels, improved cholesterol levels, improved heart and lung capacity, improved sleep, and better body composition. Everyone should speak with their doctor before starting or changing an exercise routine.  Biomechanical Limitations Clinical staff conducted group or individual video education with verbal and written material and guidebook.  Patient learns how biomechanical  limitations can impact exercise and how we can mitigate and possibly overcome limitations to have an impactful and balanced exercise routine.  Body Composition Clinical staff conducted group or individual video education with verbal and written material and guidebook.  Patient learns that body composition (ratio of muscle mass to fat mass) is a key component to assessing overall fitness, rather than body weight alone. Increased fat mass, especially visceral belly fat, can put us  at increased risk for metabolic syndrome, type 2 diabetes, heart disease, and even death. It is recommended to combine diet and exercise (cardiovascular and resistance training) to improve your body composition. Seek guidance from your physician and exercise physiologist before implementing an exercise routine.  Exercise Action Plan Clinical staff conducted group or individual video education with verbal and written material and guidebook.  Patient learns the recommended strategies to achieve and enjoy long-term exercise adherence, including variety, self-motivation, self-efficacy, and positive decision making. Benefits of exercise include fitness, good health, weight management, more energy, better sleep, less stress, and overall well-being.  Medical   Heart Disease Risk Reduction Clinical staff conducted group or individual video education with verbal and written material and guidebook.  Patient learns our heart is our most vital organ as it circulates oxygen, nutrients, white blood cells, and hormones throughout the entire body, and carries waste away. Data supports a plant-based eating plan like the Pritikin Program for its effectiveness in slowing progression  of and reversing heart disease. The video provides a number of recommendations to address heart disease.   Metabolic Syndrome and Belly Fat  Clinical staff conducted group or individual video education with verbal and written material and guidebook.  Patient learns  what metabolic syndrome is, how it leads to heart disease, and how one can reverse it and keep it from coming back. You have metabolic syndrome if you have 3 of the following 5 criteria: abdominal obesity, high blood pressure, high triglycerides, low HDL cholesterol, and high blood sugar.  Hypertension and Heart Disease Clinical staff conducted group or individual video education with verbal and written material and guidebook.  Patient learns that high blood pressure, or hypertension, is very common in the [place] . Hypertension is largely due to excessive salt intake, but other important risk factors include being overweight, physical inactivity, drinking too much alcohol, smoking, and not eating enough potassium from fruits and vegetables. High blood pressure is a leading risk factor for heart attack, stroke, congestive heart failure, dementia, kidney failure, and premature death. Long-term effects of excessive salt intake include stiffening of the arteries and thickening of heart muscle and organ damage. Recommendations include ways to reduce hypertension and the risk of heart disease.  Diseases of Our Time - Focusing on Diabetes Clinical staff conducted group or individual video education with verbal and written material and guidebook.  Patient learns why the best way to stop diseases of our time is prevention, through food and other lifestyle changes. Medicine (such as prescription pills and surgeries) is often only a Band-Aid on the problem, not a long-term solution. Most common diseases of our time include obesity, type 2 diabetes, hypertension, heart disease, and cancer. The Pritikin Program is recommended and has been proven to help reduce, reverse, and/or prevent the damaging effects of metabolic syndrome.  Nutrition   Overview of the Pritikin Eating Plan  Clinical staff conducted group or individual video education with verbal and written material and guidebook.  Patient learns about the  Pritikin Eating Plan for disease risk reduction. The Pritikin Eating Plan emphasizes a wide variety of unrefined, minimally-processed carbohydrates, like fruits, vegetables, whole grains, and legumes. Go, Caution, and Stop food choices are explained. Plant-based and lean animal proteins are emphasized. Rationale provided for low sodium intake for blood pressure control, low added sugars for blood sugar stabilization, and low added fats and oils for coronary artery disease risk reduction and weight management.  Calorie Density  Clinical staff conducted group or individual video education with verbal and written material and guidebook.  Patient learns about calorie density and how it impacts the Pritikin Eating Plan. Knowing the characteristics of the food you choose will help you decide whether those foods will lead to weight gain or weight loss, and whether you want to consume more or less of them. Weight loss is usually a side effect of the Pritikin Eating Plan because of its focus on low calorie-dense foods.  Label Reading  Clinical staff conducted group or individual video education with verbal and written material and guidebook.  Patient learns about the Pritikin recommended label reading guidelines and corresponding recommendations regarding calorie density, added sugars, sodium content, and whole grains.  Dining Out - Part 1  Clinical staff conducted group or individual video education with verbal and written material and guidebook.  Patient learns that restaurant meals can be sabotaging because they can be so high in calories, fat, sodium, and/or sugar. Patient learns recommended strategies on how to positively address this  and avoid unhealthy pitfalls.  Facts on Fats  Clinical staff conducted group or individual video education with verbal and written material and guidebook.  Patient learns that lifestyle modifications can be just as effective, if not more so, as many medications for lowering  your risk of heart disease. A Pritikin lifestyle can help to reduce your risk of inflammation and atherosclerosis (cholesterol build-up, or plaque, in the artery walls). Lifestyle interventions such as dietary choices and physical activity address the cause of atherosclerosis. A review of the types of fats and their impact on blood cholesterol levels, along with dietary recommendations to reduce fat intake is also included.  Nutrition Action Plan  Clinical staff conducted group or individual video education with verbal and written material and guidebook.  Patient learns how to incorporate Pritikin recommendations into their lifestyle. Recommendations include planning and keeping personal health goals in mind as an important part of their success.  Healthy Mind-Set    Healthy Minds, Bodies, Hearts  Clinical staff conducted group or individual video education with verbal and written material and guidebook.  Patient learns how to identify when they are stressed. Video will discuss the impact of that stress, as well as the many benefits of stress management. Patient will also be introduced to stress management techniques. The way we think, act, and feel has an impact on our hearts.  How Our Thoughts Can Heal Our Hearts  Clinical staff conducted group or individual video education with verbal and written material and guidebook.  Patient learns that negative thoughts can cause depression and anxiety. This can result in negative lifestyle behavior and serious health problems. Cognitive behavioral therapy is an effective method to help control our thoughts in order to change and improve our emotional outlook.  Additional Videos:  Exercise    Improving Performance  Clinical staff conducted group or individual video education with verbal and written material and guidebook.  Patient learns to use a non-linear approach by alternating intensity levels and lengths of time spent exercising to help burn more  calories and lose more body fat. Cardiovascular exercise helps improve heart health, metabolism, hormonal balance, blood sugar control, and recovery from fatigue. Resistance training improves strength, endurance, balance, coordination, reaction time, metabolism, and muscle mass. Flexibility exercise improves circulation, posture, and balance. Seek guidance from your physician and exercise physiologist before implementing an exercise routine and learn your capabilities and proper form for all exercise.  Introduction to Yoga  Clinical staff conducted group or individual video education with verbal and written material and guidebook.  Patient learns about yoga, a discipline of the coming together of mind, breath, and body. The benefits of yoga include improved flexibility, improved range of motion, better posture and core strength, increased lung function, weight loss, and positive self-image. Yoga's heart health benefits include lowered blood pressure, healthier heart rate, decreased cholesterol and triglyceride levels, improved immune function, and reduced stress. Seek guidance from your physician and exercise physiologist before implementing an exercise routine and learn your capabilities and proper form for all exercise.  Medical   Aging: Enhancing Your Quality of Life  Clinical staff conducted group or individual video education with verbal and written material and guidebook.  Patient learns key strategies and recommendations to stay in good physical health and enhance quality of life, such as prevention strategies, having an advocate, securing a Health Care Proxy and Power of Attorney, and keeping a list of medications and system for tracking them. It also discusses how to avoid risk for bone loss.  Biology of Weight Control  Clinical staff conducted group or individual video education with verbal and written material and guidebook.  Patient learns that weight gain occurs because we consume more  calories than we burn (eating more, moving less). Even if your body weight is normal, you may have higher ratios of fat compared to muscle mass. Too much body fat puts you at increased risk for cardiovascular disease, heart attack, stroke, type 2 diabetes, and obesity-related cancers. In addition to exercise, following the Pritikin Eating Plan can help reduce your risk.  Decoding Lab Results  Clinical staff conducted group or individual video education with verbal and written material and guidebook.  Patient learns that lab test reflects one measurement whose values change over time and are influenced by many factors, including medication, stress, sleep, exercise, food, hydration, pre-existing medical conditions, and more. It is recommended to use the knowledge from this video to become more involved with your lab results and evaluate your numbers to speak with your doctor.   Diseases of Our Time - Overview  Clinical staff conducted group or individual video education with verbal and written material and guidebook.  Patient learns that according to the CDC, 50% to 70% of chronic diseases (such as obesity, type 2 diabetes, elevated lipids, hypertension, and heart disease) are avoidable through lifestyle improvements including healthier food choices, listening to satiety cues, and increased physical activity.  Sleep Disorders Clinical staff conducted group or individual video education with verbal and written material and guidebook.  Patient learns how good quality and duration of sleep are important to overall health and well-being. Patient also learns about sleep disorders and how they impact health along with recommendations to address them, including discussing with a physician.  Nutrition  Dining Out - Part 2 Clinical staff conducted group or individual video education with verbal and written material and guidebook.  Patient learns how to plan ahead and communicate in order to maximize their  dining experience in a healthy and nutritious manner. Included are recommended food choices based on the type of restaurant the patient is visiting.   Fueling a Banker conducted group or individual video education with verbal and written material and guidebook.  There is a strong connection between our food choices and our health. Diseases like obesity and type 2 diabetes are very prevalent and are in large-part due to lifestyle choices. The Pritikin Eating Plan provides plenty of food and hunger-curbing satisfaction. It is easy to follow, affordable, and helps reduce health risks.  Menu Workshop  Clinical staff conducted group or individual video education with verbal and written material and guidebook.  Patient learns that restaurant meals can sabotage health goals because they are often packed with calories, fat, sodium, and sugar. Recommendations include strategies to plan ahead and to communicate with the manager, chef, or server to help order a healthier meal.  Planning Your Eating Strategy  Clinical staff conducted group or individual video education with verbal and written material and guidebook.  Patient learns about the Pritikin Eating Plan and its benefit of reducing the risk of disease. The Pritikin Eating Plan does not focus on calories. Instead, it emphasizes high-quality, nutrient-rich foods. By knowing the characteristics of the foods, we choose, we can determine their calorie density and make informed decisions.  Targeting Your Nutrition Priorities  Clinical staff conducted group or individual video education with verbal and written material and guidebook.  Patient learns that lifestyle habits have a tremendous impact on disease risk and  progression. This video provides eating and physical activity recommendations based on your personal health goals, such as reducing LDL cholesterol, losing weight, preventing or controlling type 2 diabetes, and reducing high  blood pressure.  Vitamins and Minerals  Clinical staff conducted group or individual video education with verbal and written material and guidebook.  Patient learns different ways to obtain key vitamins and minerals, including through a recommended healthy diet. It is important to discuss all supplements you take with your doctor.   Healthy Mind-Set    Smoking Cessation  Clinical staff conducted group or individual video education with verbal and written material and guidebook.  Patient learns that cigarette smoking and tobacco addiction pose a serious health risk which affects millions of people. Stopping smoking will significantly reduce the risk of heart disease, lung disease, and many forms of cancer. Recommended strategies for quitting are covered, including working with your doctor to develop a successful plan.  Culinary   Becoming a Set Designer conducted group or individual video education with verbal and written material and guidebook.  Patient learns that cooking at home can be healthy, cost-effective, quick, and puts them in control. Keys to cooking healthy recipes will include looking at your recipe, assessing your equipment needs, planning ahead, making it simple, choosing cost-effective seasonal ingredients, and limiting the use of added fats, salts, and sugars.  Cooking - Breakfast and Snacks  Clinical staff conducted group or individual video education with verbal and written material and guidebook.  Patient learns how important breakfast is to satiety and nutrition through the entire day. Recommendations include key foods to eat during breakfast to help stabilize blood sugar levels and to prevent overeating at meals later in the day. Planning ahead is also a key component.  Cooking - Educational Psychologist conducted group or individual video education with verbal and written material and guidebook.  Patient learns eating strategies to improve overall  health, including an approach to cook more at home. Recommendations include thinking of animal protein as a side on your plate rather than center stage and focusing instead on lower calorie dense options like vegetables, fruits, whole grains, and plant-based proteins, such as beans. Making sauces in large quantities to freeze for later and leaving the skin on your vegetables are also recommended to maximize your experience.  Cooking - Healthy Salads and Dressing Clinical staff conducted group or individual video education with verbal and written material and guidebook.  Patient learns that vegetables, fruits, whole grains, and legumes are the foundations of the Pritikin Eating Plan. Recommendations include how to incorporate each of these in flavorful and healthy salads, and how to create homemade salad dressings. Proper handling of ingredients is also covered. Cooking - Soups and [employer] - Soups and Desserts Clinical staff conducted group or individual video education with verbal and written material and guidebook.  Patient learns that Pritikin soups and desserts make for easy, nutritious, and delicious snacks and meal components that are low in sodium, fat, sugar, and calorie density, while high in vitamins, minerals, and filling fiber. Recommendations include simple and healthy ideas for soups and desserts.   Overview     The Pritikin Solution Program Overview Clinical staff conducted group or individual video education with verbal and written material and guidebook.  Patient learns that the results of the Pritikin Program have been documented in more than 100 articles published in peer-reviewed journals, and the benefits include reducing risk factors for (and, in some  cases, even reversing) high cholesterol, high blood pressure, type 2 diabetes, obesity, and more! An overview of the three key pillars of the Pritikin Program will be covered: eating well, doing regular exercise, and having a  healthy mind-set.  WORKSHOPS  Exercise: Exercise Basics: Building Your Action Plan Clinical staff led group instruction and group discussion with PowerPoint presentation and patient guidebook. To enhance the learning environment the use of posters, models and videos may be added. At the conclusion of this workshop, patients will comprehend the difference between physical activity and exercise, as well as the benefits of incorporating both, into their routine. Patients will understand the FITT (Frequency, Intensity, Time, and Type) principle and how to use it to build an exercise action plan. In addition, safety concerns and other considerations for exercise and cardiac rehab will be addressed by the presenter. The purpose of this lesson is to promote a comprehensive and effective weekly exercise routine in order to improve patients' overall level of fitness.   Managing Heart Disease: Your Path to a Healthier Heart Clinical staff led group instruction and group discussion with PowerPoint presentation and patient guidebook. To enhance the learning environment the use of posters, models and videos may be added.At the conclusion of this workshop, patients will understand the anatomy and physiology of the heart. Additionally, they will understand how Pritikin's three pillars impact the risk factors, the progression, and the management of heart disease.  The purpose of this lesson is to provide a high-level overview of the heart, heart disease, and how the Pritikin lifestyle positively impacts risk factors.  Exercise Biomechanics Clinical staff led group instruction and group discussion with PowerPoint presentation and patient guidebook. To enhance the learning environment the use of posters, models and videos may be added. Patients will learn how the structural parts of their bodies function and how these functions impact their daily activities, movement, and exercise. Patients will learn how to  promote a neutral spine, learn how to manage pain, and identify ways to improve their physical movement in order to promote healthy living. The purpose of this lesson is to expose patients to common physical limitations that impact physical activity. Participants will learn practical ways to adapt and manage aches and pains, and to minimize their effect on regular exercise. Patients will learn how to maintain good posture while sitting, walking, and lifting.  Balance Training and Fall Prevention  Clinical staff led group instruction and group discussion with PowerPoint presentation and patient guidebook. To enhance the learning environment the use of posters, models and videos may be added. At the conclusion of this workshop, patients will understand the importance of their sensorimotor skills (vision, proprioception, and the vestibular system) in maintaining their ability to balance as they age. Patients will apply a variety of balancing exercises that are appropriate for their current level of function. Patients will understand the common causes for poor balance, possible solutions to these problems, and ways to modify their physical environment in order to minimize their fall risk. The purpose of this lesson is to teach patients about the importance of maintaining balance as they age and ways to minimize their risk of falling.  WORKSHOPS   Nutrition:  Fueling a Ship Broker led group instruction and group discussion with PowerPoint presentation and patient guidebook. To enhance the learning environment the use of posters, models and videos may be added. Patients will review the foundational principles of the Pritikin Eating Plan and understand what constitutes a serving size in each of  the food groups. Patients will also learn Pritikin-friendly foods that are better choices when away from home and review make-ahead meal and snack options. Calorie density will be reviewed and  applied to three nutrition priorities: weight maintenance, weight loss, and weight gain. The purpose of this lesson is to reinforce (in a group setting) the key concepts around what patients are recommended to eat and how to apply these guidelines when away from home by planning and selecting Pritikin-friendly options. Patients will understand how calorie density may be adjusted for different weight management goals.  Mindful Eating  Clinical staff led group instruction and group discussion with PowerPoint presentation and patient guidebook. To enhance the learning environment the use of posters, models and videos may be added. Patients will briefly review the concepts of the Pritikin Eating Plan and the importance of low-calorie dense foods. The concept of mindful eating will be introduced as well as the importance of paying attention to internal hunger signals. Triggers for non-hunger eating and techniques for dealing with triggers will be explored. The purpose of this lesson is to provide patients with the opportunity to review the basic principles of the Pritikin Eating Plan, discuss the value of eating mindfully and how to measure internal cues of hunger and fullness using the Hunger Scale. Patients will also discuss reasons for non-hunger eating and learn strategies to use for controlling emotional eating.  Targeting Your Nutrition Priorities Clinical staff led group instruction and group discussion with PowerPoint presentation and patient guidebook. To enhance the learning environment the use of posters, models and videos may be added. Patients will learn how to determine their genetic susceptibility to disease by reviewing their family history. Patients will gain insight into the importance of diet as part of an overall healthy lifestyle in mitigating the impact of genetics and other environmental insults. The purpose of this lesson is to provide patients with the opportunity to assess their personal  nutrition priorities by looking at their family history, their own health history and current risk factors. Patients will also be able to discuss ways of prioritizing and modifying the Pritikin Eating Plan for their highest risk areas  Menu  Clinical staff led group instruction and group discussion with PowerPoint presentation and patient guidebook. To enhance the learning environment the use of posters, models and videos may be added. Using menus brought in from [employer], or printed from [employer], patients will apply the Pritikin dining out guidelines that were presented in the [employer] video. Patients will also be able to practice these guidelines in a variety of provided scenarios. The purpose of this lesson is to provide patients with the opportunity to practice hands-on learning of the Pritikin Dining Out guidelines with actual menus and practice scenarios.  Label Reading Clinical staff led group instruction and group discussion with PowerPoint presentation and patient guidebook. To enhance the learning environment the use of posters, models and videos may be added. Patients will review and discuss the Pritikin label reading guidelines presented in Pritikin's Label Reading Educational series video. Using fool labels brought in from local grocery stores and markets, patients will apply the label reading guidelines and determine if the packaged food meet the Pritikin guidelines. The purpose of this lesson is to provide patients with the opportunity to review, discuss, and practice hands-on learning of the Pritikin Label Reading guidelines with actual packaged food labels. Cooking School  Pritikin's [employer] are designed to teach patients ways to prepare quick, simple, and affordable  recipes at home. The importance of nutrition's role in chronic disease risk reduction is reflected in its emphasis in the overall Pritikin program. By learning how to prepare  essential core Pritikin Eating Plan recipes, patients will increase control over what they eat; be able to customize the flavor of foods without the use of added salt, sugar, or fat; and improve the quality of the food they consume. By learning a set of core recipes which are easily assembled, quickly prepared, and affordable, patients are more likely to prepare more healthy foods at home. These workshops focus on convenient breakfasts, simple entres, side dishes, and desserts which can be prepared with minimal effort and are consistent with nutrition recommendations for cardiovascular risk reduction. Cooking [employer] are taught by a armed forces logistics/support/administrative officer (RD) who has been trained by the [employer]. The chef or RD has a clear understanding of the importance of minimizing - if not completely eliminating - added fat, sugar, and sodium in recipes. Throughout the series of Cooking School Workshop sessions, patients will learn about healthy ingredients and efficient methods of cooking to build confidence in their capability to prepare    Cooking School weekly topics:  Adding Flavor- Sodium-Free  Fast and Healthy Breakfasts  Powerhouse Plant-Based Proteins  Satisfying Salads and Dressings  Simple Sides and Sauces  International Cuisine-Spotlight on the [employer] Zones  Delicious Desserts  Savory Soups  [employer] - Meals in a Astronomer Appetizers and Snacks  Comforting Weekend Breakfasts  One-Pot Wonders   Fast Evening Meals  Landscape Architect Your Pritikin Plate  WORKSHOPS   Healthy Mindset (Psychosocial):  Focused Goals, Sustainable Changes Clinical staff led group instruction and group discussion with PowerPoint presentation and patient guidebook. To enhance the learning environment the use of posters, models and videos may be added. Patients will be able to apply effective goal setting strategies to establish at least one personal goal, and  then take consistent, meaningful action toward that goal. They will learn to identify common barriers to achieving personal goals and develop strategies to overcome them. Patients will also gain an understanding of how our mind-set can impact our ability to achieve goals and the importance of cultivating a positive and growth-oriented mind-set. The purpose of this lesson is to provide patients with a deeper understanding of how to set and achieve personal goals, as well as the tools and strategies needed to overcome common obstacles which may arise along the way.  From Head to Heart: The Power of a Healthy Outlook  Clinical staff led group instruction and group discussion with PowerPoint presentation and patient guidebook. To enhance the learning environment the use of posters, models and videos may be added. Patients will be able to recognize and describe the impact of emotions and mood on physical health. They will discover the importance of self-care and explore self-care practices which may work for them. Patients will also learn how to utilize the 4 C's to cultivate a healthier outlook and better manage stress and challenges. The purpose of this lesson is to demonstrate to patients how a healthy outlook is an essential part of maintaining good health, especially as they continue their cardiac rehab journey.  Healthy Sleep for a Healthy Heart Clinical staff led group instruction and group discussion with PowerPoint presentation and patient guidebook. To enhance the learning environment the use of posters, models and videos may be added. At the conclusion of this workshop, patients will be able to demonstrate knowledge  of the importance of sleep to overall health, well-being, and quality of life. They will understand the symptoms of, and treatments for, common sleep disorders. Patients will also be able to identify daytime and nighttime behaviors which impact sleep, and they will be able to apply these  tools to help manage sleep-related challenges. The purpose of this lesson is to provide patients with a general overview of sleep and outline the importance of quality sleep. Patients will learn about a few of the most common sleep disorders. Patients will also be introduced to the concept of "sleep hygiene," and discover ways to self-manage certain sleeping problems through simple daily behavior changes. Finally, the workshop will motivate patients by clarifying the links between quality sleep and their goals of heart-healthy living.   Recognizing and Reducing Stress Clinical staff led group instruction and group discussion with PowerPoint presentation and patient guidebook. To enhance the learning environment the use of posters, models and videos may be added. At the conclusion of this workshop, patients will be able to understand the types of stress reactions, differentiate between acute and chronic stress, and recognize the impact that chronic stress has on their health. They will also be able to apply different coping mechanisms, such as reframing negative self-talk. Patients will have the opportunity to practice a variety of stress management techniques, such as deep abdominal breathing, progressive muscle relaxation, and/or guided imagery.  The purpose of this lesson is to educate patients on the role of stress in their lives and to provide healthy techniques for coping with it.  Learning Barriers/Preferences:  Learning Barriers/Preferences - 06/10/24 1415       Learning Barriers/Preferences   Learning Barriers Sight;Hearing   wears glasses, HOH   Learning Preferences Computer/Internet;Audio;Group Instruction;Individual Instruction;Pictoral;Skilled Demonstration;Verbal Instruction;Video;Written Material          Education Topics:  Knowledge Questionnaire Score:  Knowledge Questionnaire Score - 06/10/24 1437       Knowledge Questionnaire Score   Pre Score 26/28          Core  Components/Risk Factors/Patient Goals at Admission:  Personal Goals and Risk Factors at Admission - 06/10/24 1416       Core Components/Risk Factors/Patient Goals on Admission    Weight Management Yes;Obesity    Intervention Weight Management/Obesity: Establish reasonable short term and long term weight goals.;Obesity: Provide education and appropriate resources to help participant work on and attain dietary goals.    Expected Outcomes Short Term: Continue to assess and modify interventions until short term weight is achieved;Long Term: Adherence to nutrition and physical activity/exercise program aimed toward attainment of established weight goal;Weight Loss: Understanding of general recommendations for a balanced deficit meal plan, which promotes 1-2 lb weight loss per week and includes a negative energy balance of [id number] kcal/d;Understanding recommendations for meals to include 15-35% energy as protein, 25-35% energy from fat, 35-60% energy from carbohydrates, less than 200mg  of dietary cholesterol, 20-35 gm of total fiber daily;Understanding of distribution of calorie intake throughout the day with the consumption of 4-5 meals/snacks    Tobacco Cessation Yes    Number of packs per day 3-4 cigarettes per day    Intervention Assist the participant in steps to quit. Provide individualized education and counseling about committing to Tobacco Cessation, relapse prevention, and pharmacological support that can be provided by physician.;Education officer, environmental, assist with locating and accessing local/national Quit Smoking programs, and support quit date choice.    Expected Outcomes Short Term: Will demonstrate readiness to quit, by selecting a quit  date.;Long Term: Complete abstinence from all tobacco products for at least 12 months from quit date.;Short Term: Will quit all tobacco product use, adhering to prevention of relapse plan.    Diabetes Yes    Intervention Provide education about  signs/symptoms and action to take for hypo/hyperglycemia.;Provide education about proper nutrition, including hydration, and aerobic/resistive exercise prescription along with prescribed medications to achieve blood glucose in normal ranges: Fasting glucose 65-99 mg/dL    Expected Outcomes Short Term: Participant verbalizes understanding of the signs/symptoms and immediate care of hyper/hypoglycemia, proper foot care and importance of medication, aerobic/resistive exercise and nutrition plan for blood glucose control.;Long Term: Attainment of HbA1C < 7%.    Heart Failure Yes    Intervention Provide a combined exercise and nutrition program that is supplemented with education, support and counseling about heart failure. Directed toward relieving symptoms such as shortness of breath, decreased exercise tolerance, and extremity edema.    Expected Outcomes Improve functional capacity of life;Short term: Attendance in program 2-3 days a week with increased exercise capacity. Reported lower sodium intake. Reported increased fruit and vegetable intake. Reports medication compliance.;Short term: Daily weights obtained and reported for increase. Utilizing diuretic protocols set by physician.;Long term: Adoption of self-care skills and reduction of barriers for early signs and symptoms recognition and intervention leading to self-care maintenance.    Hypertension Yes    Intervention Provide education on lifestyle modifcations including regular physical activity/exercise, weight management, moderate sodium restriction and increased consumption of fresh fruit, vegetables, and low fat dairy, alcohol moderation, and smoking cessation.;Monitor prescription use compliance.    Expected Outcomes Short Term: Continued assessment and intervention until BP is < 140/36mm HG in hypertensive participants. < 130/40mm HG in hypertensive participants with diabetes, heart failure or chronic kidney disease.;Long Term: Maintenance of  blood pressure at goal levels.    Lipids Yes    Intervention Provide education and support for participant on nutrition & aerobic/resistive exercise along with prescribed medications to achieve LDL 70mg , HDL >40mg .    Expected Outcomes Short Term: Participant states understanding of desired cholesterol values and is compliant with medications prescribed. Participant is following exercise prescription and nutrition guidelines.;Long Term: Cholesterol controlled with medications as prescribed, with individualized exercise RX and with personalized nutrition plan. Value goals: LDL < 70mg , HDL > 40 mg.          Core Components/Risk Factors/Patient Goals Review:    Core Components/Risk Factors/Patient Goals at Discharge (Final Review):    ITP Comments:  ITP Comments     Row Name 06/10/24 1038           ITP Comments Wilbert Bihari, MD: Medical Director. Introduction to the [facility]/Intensive Cardiac Rehab. Initial orientation packet reviewed with patient.          Comments: Participant attended orientation for the cardiac rehabilitation program on  06/10/2024  to perform initial intake and exercise walk test. Patient introduced to the [facility] education and orientation packet was reviewed. Completed 6-minute walk test, measurements, initial ITP, and exercise prescription. Vital signs stable. Telemetry-atrial fibrillation, frequent PVC's, asymptomatic.   Service time was from 10:30 to 1302.

## 2024-06-14 ENCOUNTER — Ambulatory Visit: Payer: Self-pay | Admitting: Cardiology

## 2024-06-14 ENCOUNTER — Encounter (HOSPITAL_COMMUNITY)
Admission: RE | Admit: 2024-06-14 | Discharge: 2024-06-14 | Disposition: A | Source: Ambulatory Visit | Attending: Cardiology

## 2024-06-14 DIAGNOSIS — I5042 Chronic combined systolic (congestive) and diastolic (congestive) heart failure: Secondary | ICD-10-CM

## 2024-06-14 LAB — GLUCOSE, CAPILLARY
Glucose-Capillary: 105 mg/dL — ABNORMAL HIGH (ref 70–99)
Glucose-Capillary: 121 mg/dL — ABNORMAL HIGH (ref 70–99)
Glucose-Capillary: 167 mg/dL — ABNORMAL HIGH (ref 70–99)

## 2024-06-14 NOTE — Progress Notes (Signed)
 Daily Session Note  Patient Details  Name: Mason Patton MRN: [id number] Date of Birth: 1945-11-05 Referring Provider:   Flowsheet Row INTENSIVE CARDIAC REHAB ORIENT from 06/10/2024 in [facility]  Referring Provider Dr. Edda Holland, MD covering)    Encounter Date: 06/14/2024  Check In:  Session Check In - 06/14/24 1047       Check-In   Supervising physician immediately available to respond to emergencies [facility] MD immediately available    Physician(s) Rosaline Bane, NP    Location [facility]    Staff Present Alm Parkins, MS, ACSM-CEP, CCRP, Exercise Physiologist;Jetta Vannie BS, ACSM-CEP, Exercise Physiologist;Joseph Lennon, RN, Jayson Quan, RN, Avonne Gal, MS, ACSM-CEP, Exercise Physiologist    Virtual Visit No    Medication changes reported     No    Fall or balance concerns reported    No    Tobacco Cessation No Change    Current number of cigarettes/nicotine per day     4    Warm-up and Cool-down Performed as group-led instruction    Resistance Training Performed No    VAD Patient? No    PAD/SET Patient? No      Pain Assessment   Currently in Pain? No/denies    Pain Score 0-No pain    Multiple Pain Sites No          Capillary Blood Glucose: Results for orders placed or performed during the hospital encounter of 06/14/24 (from the past 24 hours)  Glucose, capillary     Status: Abnormal   Collection Time: 06/14/24 10:36 AM  Result Value Ref Range   Glucose-Capillary 105 (H) 70 - 99 mg/dL  Glucose, capillary     Status: Abnormal   Collection Time: 06/14/24 10:54 AM  Result Value Ref Range   Glucose-Capillary 121 (H) 70 - 99 mg/dL  Glucose, capillary     Status: Abnormal   Collection Time: 06/14/24 11:23 AM  Result Value Ref Range   Glucose-Capillary 167 (H) 70 - 99 mg/dL     Exercise Prescription Changes - 06/14/24 1034       Response to Exercise   Blood  Pressure (Admit) 132/64    Blood Pressure (Exercise) 114/68    Blood Pressure (Exit) 124/72    Heart Rate (Admit) 66 bpm    Heart Rate (Exercise) 91 bpm    Heart Rate (Exit) 73 bpm    Rating of Perceived Exertion (Exercise) 11    Symptoms None    Comments Off to a good start with exercise.    Duration Progress to 30 minutes of  aerobic without signs/symptoms of physical distress    Intensity THRR unchanged      Progression   Progression Continue to progress workloads to maintain intensity without signs/symptoms of physical distress.    Average METs 1.6      Resistance Training   Training Prescription Yes    Weight 3 lbs    Reps 10-15    Time 5 Minutes      Interval Training   Interval Training No      NuStep   Level 1    SPM 60    Minutes 25    METs 1.6          Social History   Tobacco Use  Smoking Status Every Day   Current packs/day: 0.50   Average packs/day: 0.5 packs/day for 50.0 years (25.0 ttl pk-yrs)   Types: Cigarettes  Passive exposure: Never  Smokeless Tobacco Never    Goals Met:  Exercise tolerated well No report of concerns or symptoms today Strength training completed today  Goals Unmet:  Not Applicable  Comments: Todd started cardiac rehab today.  Pt tolerated light exercise without difficulty. VSS, telemetry-chronic Afib with PVC's, asymptomatic. Stancil is deconditioned and uses a rollator for stability.  Medication list reconciled. Pt denies barriers to medicaiton compliance.  PSYCHOSOCIAL ASSESSMENT:  PHQ-6. Pt exhibits positive coping skills, hopeful outlook with supportive family. No psychosocial needs identified at this time, no psychosocial interventions necessary.    Pt enjoys watching TV.   Pt oriented to exercise equipment and routine. Pre exercise CBG 105. Patient had eaten breakfast given orange juice.  Recheck CBG 121. Barlow was able to proceed with exercise without further difficulties.  Understanding verbalized.Hadassah Elpidio Quan  RN BSN    Dr. Wilbert Bihari is Medical Director for Cardiac Rehab at [facility].

## 2024-06-16 ENCOUNTER — Encounter (HOSPITAL_COMMUNITY)
Admission: RE | Admit: 2024-06-16 | Discharge: 2024-06-16 | Disposition: A | Discharge status: Home / Self Care | Source: Ambulatory Visit | Attending: Cardiology | Admitting: Cardiology

## 2024-06-16 DIAGNOSIS — I5042 Chronic combined systolic (congestive) and diastolic (congestive) heart failure: Secondary | ICD-10-CM | POA: Diagnosis not present

## 2024-06-18 ENCOUNTER — Encounter (HOSPITAL_COMMUNITY)
Admission: RE | Admit: 2024-06-18 | Discharge: 2024-06-18 | Disposition: A | Source: Ambulatory Visit | Attending: Cardiology

## 2024-06-18 DIAGNOSIS — I5042 Chronic combined systolic (congestive) and diastolic (congestive) heart failure: Secondary | ICD-10-CM | POA: Diagnosis not present

## 2024-06-21 ENCOUNTER — Encounter (HOSPITAL_COMMUNITY)
Admission: RE | Admit: 2024-06-21 | Discharge: 2024-06-21 | Disposition: A | Source: Ambulatory Visit | Attending: Cardiology

## 2024-06-21 DIAGNOSIS — I5042 Chronic combined systolic (congestive) and diastolic (congestive) heart failure: Secondary | ICD-10-CM

## 2024-06-23 ENCOUNTER — Encounter (HOSPITAL_COMMUNITY): Admission: RE | Admit: 2024-06-23 | Source: Ambulatory Visit

## 2024-06-23 ENCOUNTER — Telehealth (HOSPITAL_COMMUNITY): Payer: Self-pay

## 2024-06-23 NOTE — Telephone Encounter (Signed)
 Patient c/o for 10:15 CR class, no reason given.

## 2024-06-24 ENCOUNTER — Telehealth (HOSPITAL_COMMUNITY): Payer: Self-pay

## 2024-06-24 NOTE — Telephone Encounter (Signed)
 Returned phone call from the [place] to [place] to let her know the time and date of pt starting cardiac rehab.

## 2024-06-25 ENCOUNTER — Telehealth (HOSPITAL_COMMUNITY): Payer: Self-pay

## 2024-06-25 ENCOUNTER — Encounter (HOSPITAL_COMMUNITY): Admission: RE | Admit: 2024-06-25

## 2024-06-25 NOTE — Telephone Encounter (Signed)
 Patient c/o for 10:15 CR class, left message stating he was having diarrhea.

## 2024-06-28 ENCOUNTER — Encounter (HOSPITAL_COMMUNITY): Admission: RE | Admit: 2024-06-28 | Discharge: 2024-06-28 | Attending: Cardiology

## 2024-06-28 DIAGNOSIS — I5042 Chronic combined systolic (congestive) and diastolic (congestive) heart failure: Secondary | ICD-10-CM | POA: Diagnosis not present

## 2024-06-30 ENCOUNTER — Encounter (HOSPITAL_COMMUNITY)
Admission: RE | Admit: 2024-06-30 | Discharge: 2024-06-30 | Disposition: A | Source: Ambulatory Visit | Attending: Cardiology

## 2024-06-30 ENCOUNTER — Encounter (HOSPITAL_COMMUNITY)
Admission: RE | Admit: 2024-06-30 | Discharge: 2024-06-30 | Disposition: A | Discharge status: Home / Self Care | Source: Ambulatory Visit | Attending: Cardiology | Admitting: Cardiology

## 2024-06-30 DIAGNOSIS — I5042 Chronic combined systolic (congestive) and diastolic (congestive) heart failure: Secondary | ICD-10-CM

## 2024-07-05 ENCOUNTER — Encounter (HOSPITAL_COMMUNITY)
Admission: RE | Admit: 2024-07-05 | Discharge: 2024-07-05 | Disposition: A | Source: Ambulatory Visit | Attending: Cardiology

## 2024-07-05 ENCOUNTER — Ambulatory Visit: Payer: Self-pay | Admitting: Cardiology

## 2024-07-05 DIAGNOSIS — I5042 Chronic combined systolic (congestive) and diastolic (congestive) heart failure: Secondary | ICD-10-CM | POA: Diagnosis not present

## 2024-07-05 LAB — GLUCOSE, CAPILLARY: Glucose-Capillary: 145 mg/dL — ABNORMAL HIGH (ref 70–99)

## 2024-07-06 NOTE — Progress Notes (Signed)
 Cardiac Individual Treatment Plan  Patient Details  Name: Mason Patton MRN: [id number] Date of Birth: May 05, 1946 Referring Provider:   Flowsheet Row INTENSIVE CARDIAC REHAB ORIENT from 06/10/2024 in [facility] for Heart, Vascular, & Lung Health  Referring Provider Dr. Edda Holland, MD covering)    Initial Encounter Date:  Flowsheet Row INTENSIVE CARDIAC REHAB ORIENT from 06/10/2024 in [facility] for Heart, Vascular, & Lung Health  Date 06/10/24    Visit Diagnosis: Heart failure, systolic and diastolic, chronic (HCC)  Patient's Home Medications on Admission: Current Medications[1]  Past Medical History: Past Medical History:  Diagnosis Date   Aortic stenosis    09/30/19 echo ([facility], Cardiologist Dr. 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/19/2013)   Meningoencephalitis    Obesity    OSA on CPAP    last sleep study >8 yrs   Pneumonia    twice (05/19/2013)   Shortness of breath    Stroke [facility])    they said I had a minor one when I had menigitis (05/19/2013)   Thyroid  disease    Tobacco abuse    quit 11/08/2012   Type II diabetes mellitus (HCC)    Umbilical hernia     Tobacco Use: Tobacco Use History[2]  Labs: Review Flowsheet  More data exists      Latest Ref Rng & Units 09/07/2020 09/11/2020 11/05/2020 05/18/2021 02/01/2022  Labs for ITP Cardiac and Pulmonary Rehab   Cholestrol 0 - 200 mg/dL - - - - 82   LDL (calc) 0 - 99 mg/dL - - - - 46   HDL-C >59 mg/dL - - - - 20   Trlycerides <150 mg/dL - - - - 78   Hemoglobin A1c 4.8 - 5.6 % - - - 6.4  5.7   PH, Arterial 7.350 - 7.450 7.454  7.438  7.428  7.422  7.381  7.333  7.392  7.474  7.319  7.374  - - -  PCO2 arterial 32.0 - 48.0 mmHg 41.3  34.0  34.1  35.1  40.1  49.3  45.1  37.2  53.9  45.4  - - -  Bicarbonate 20.0 - 28.0 mmol/L 29.0  30.0  29.7  23.1  22.6  22.9  23.8  26.2  27.5  27.3  27.7  26.5  - - -  TCO2 22 - 32 mmol/L 30  31  31  24  24  24  25  26  28  27  25  29  26  28  27  29  28  29  27  26   - -  Acid-base deficit 0.0 - 2.0 mmol/L - 1.0  2.0  1.0  1.0  - - -  O2 Saturation % 99.0  57.0  56.0  96.0  94.0  94.0  93.0  100.0  100.0  100.0  84.0  100.0  - - -  Details       Multiple values from one day are sorted in reverse-chronological order         Capillary Blood Glucose: Lab Results  Component Value Date   GLUCAP 145 (H) 07/05/2024   GLUCAP 167 (H) 06/14/2024   GLUCAP 121 (H) 06/14/2024   GLUCAP 105 (H) 06/14/2024   GLUCAP 296 (H) 06/10/2024     Exercise Target Goals: Exercise Program Goal: Individual exercise prescription set using results from initial 6 min walk test and THRR while considering  patients activity barriers and safety.   Exercise Prescription Goal: Initial exercise prescription builds to 30-45 minutes a day of aerobic activity, 2-3 days per week.  Home exercise guidelines will be given to patient during program as part of exercise prescription that the participant will acknowledge.  Activity Barriers & Risk Stratification:  Activity Barriers & Cardiac Risk Stratification - 06/10/24 1419       Activity Barriers & Cardiac Risk Stratification   Activity Barriers Left Knee Replacement;Right Knee Replacement;Deconditioning;Balance Concerns;Assistive Device;Back Problems;Decreased Ventricular Function;Other (comment)    Comments Neuropathy in the feet     Cardiac Risk Stratification High          6 Minute Walk:  6 Minute Walk     Row Name 06/10/24 1245         6 Minute Walk   Phase Initial  Pt used rollator     Distance 1080 feet     Walk Time 6 minutes     # of Rest Breaks 0     MPH 2.05     METS 1.73     RPE 9     Perceived Dyspnea  0     VO2 Peak 6.05     Symptoms Yes (comment)     Comments SOB, RPD = 1, resolved with rest     Resting HR 72 bpm     Resting BP 120/72     Resting Oxygen Saturation  99 %     Exercise Oxygen Saturation  during 6 min walk 100 %     Max Ex. HR 84 bpm     Max Ex. BP 132/76     2 Minute Post BP 116/70        Oxygen Initial Assessment:   Oxygen Re-Evaluation:   Oxygen Discharge (Final Oxygen Re-Evaluation):   Initial Exercise Prescription:  Initial Exercise Prescription - 06/10/24 1400       Date of Initial Exercise RX and Referring Provider   Date 06/10/24    Referring Provider Dr. Edda Holland, MD covering)    Expected Discharge Date 09/01/24      NuStep   Level 1    SPM 75    Minutes 25      Prescription Details   Frequency (times per week) 3    Duration Progress to 30 minutes of continuous aerobic without signs/symptoms of physical distress      Intensity   THRR 40-80% of Max Heartrate 57-114    Ratings of Perceived Exertion 11-13    Perceived Dyspnea 0-4      Progression   Progression Continue progressive overload as per policy without signs/symptoms or physical distress.      Resistance Training   Training Prescription Yes    Weight 3 lbs    Reps 10-15          Perform Capillary Blood Glucose checks as needed.  Exercise Prescription Changes:   Exercise Prescription Changes     Row  Name 06/14/24 1034 06/21/24 1036 07/05/24 1026         Response to Exercise   Blood Pressure (Admit) 132/64 124/72 130/70     Blood Pressure (Exercise) 114/68 128/72 --     Blood Pressure (Exit) 124/72 126/72 110/74     Heart Rate (Admit) 66 bpm 65 bpm 75 bpm      Heart Rate (Exercise) 91 bpm 84 bpm 97 bpm     Heart Rate (Exit) 73 bpm 74 bpm 84 bpm     Rating of Perceived Exertion (Exercise) 11 11 14      Symptoms None None None     Comments Off to a good start with exercise. -- --     Duration Progress to 30 minutes of  aerobic without signs/symptoms of physical distress Progress to 30 minutes of  aerobic without signs/symptoms of physical distress Progress to 30 minutes of  aerobic without signs/symptoms of physical distress     Intensity THRR unchanged THRR unchanged THRR unchanged       Progression   Progression Continue to progress workloads to maintain intensity without signs/symptoms of physical distress. Continue to progress workloads to maintain intensity without signs/symptoms of physical distress. Continue to progress workloads to maintain intensity without signs/symptoms of physical distress.     Average METs 1.6 2 1.8       Resistance Training   Training Prescription Yes -- Yes     Weight 3 lbs 3 lbs 3 lbs     Reps 10-15 10-15 10-15     Time 5 Minutes 5 Minutes 5 Minutes       Interval Training   Interval Training No No No       NuStep   Level 1 1 4      SPM 60 77 60     Minutes 25 25 25      METs 1.6 2 1.8        Exercise Comments:   Exercise Comments     Row Name 06/14/24 1411           Exercise Comments Arael tolerated low intensity exercise well without symptoms. Oriented him to the exercise equipment and stretching routine.Stretching handout given.          Exercise Goals and Review:   Exercise Goals     Row Name 06/10/24 1435             Exercise Goals   Increase Physical Activity Yes       Intervention Provide advice, education, support and counseling about physical activity/exercise needs.;Develop an individualized exercise prescription for aerobic and resistive training based on initial evaluation findings, risk stratification, comorbidities and participant's personal goals.       Expected Outcomes  Short Term: Attend rehab on a regular basis to increase amount of physical activity.;Long Term: Exercising regularly at least 3-5 days a week.;Long Term: Add in home exercise to make exercise part of routine and to increase amount of physical activity.       Increase Strength and Stamina Yes       Intervention Provide advice, education, support and counseling about physical activity/exercise needs.;Develop an individualized exercise prescription for aerobic and resistive training based on initial evaluation findings, risk stratification, comorbidities and participant's personal goals.       Expected Outcomes Short Term: Increase workloads from initial exercise prescription for resistance, speed, and METs.;Short Term: Perform resistance training exercises routinely during rehab and add in resistance training at home;Long Term: Improve cardiorespiratory fitness, muscular endurance  and strength as measured by increased METs and functional capacity ( )       Able to understand and use rate of perceived exertion (RPE) scale Yes       Intervention Provide education and explanation on how to use RPE scale       Expected Outcomes Short Term: Able to use RPE daily in rehab to express subjective intensity level;Long Term:  Able to use RPE to guide intensity level when exercising independently       Knowledge and understanding of Target Heart Rate Range (THRR) Yes       Intervention Provide education and explanation of THRR including how the numbers were predicted and where they are located for reference       Expected Outcomes Short Term: Able to state/look up THRR;Long Term: Able to use THRR to govern intensity when exercising independently;Short Term: Able to use daily as guideline for intensity in rehab       Understanding of Exercise Prescription Yes       Intervention Provide education, explanation, and written materials on patient's individual exercise prescription       Expected Outcomes Short Term: Able  to explain program exercise prescription;Long Term: Able to explain home exercise prescription to exercise independently          Exercise Goals Re-Evaluation :  Exercise Goals Re-Evaluation     Row Name 06/14/24 1411             Exercise Goal Re-Evaluation   Exercise Goals Review Increase Physical Activity;Increase Strength and Stamina;Able to understand and use rate of perceived exertion (RPE) scale       Comments Mihailo is able to understand and use RPE scale appropriately.       Expected Outcomes Progress duration to 30 minutes. Increase workloads as tolerated to help increase strength and stamina.          Discharge Exercise Prescription (Final Exercise Prescription Changes):  Exercise Prescription Changes - 07/05/24 1026       Response to Exercise   Blood Pressure (Admit) 130/70    Blood Pressure (Exit) 110/74    Heart Rate (Admit) 75 bpm    Heart Rate (Exercise) 97 bpm    Heart Rate (Exit) 84 bpm    Rating of Perceived Exertion (Exercise) 14    Symptoms None    Duration Progress to 30 minutes of  aerobic without signs/symptoms of physical distress    Intensity THRR unchanged      Progression   Progression Continue to progress workloads to maintain intensity without signs/symptoms of physical distress.    Average METs 1.8      Resistance Training   Training Prescription Yes    Weight 3 lbs    Reps 10-15    Time 5 Minutes      Interval Training   Interval Training No      NuStep   Level 4    SPM 60    Minutes 25    METs 1.8          Nutrition:  Target Goals: Understanding of nutrition guidelines, daily intake of sodium 1500mg , cholesterol 200mg , calories 30% from fat and 7% or less from saturated fats, daily to have 5 or more servings of fruits and vegetables.  Biometrics:  Pre Biometrics - 06/10/24 1145       Pre Biometrics   Waist Circumference 45 inches    Hip Circumference 44 inches    Waist to Hip Ratio 1.02 %  Triceps Skinfold 17 mm     % Body Fat 32.9 %    Grip Strength 16 kg    Flexibility --   pt was uncomfortable geting on table, not performed   Single Leg Stand 2.3 seconds           Nutrition Therapy Plan and Nutrition Goals:  Nutrition Therapy & Goals - 06/14/24 1248       Nutrition Therapy   Diet Regular diet    Drug/Food Interactions Statins/Certain Fruits      Personal Nutrition Goals   Nutrition Goal Patient to identify strategies for reducing cardiovascular risk by attending the Pritikin education and nutrition series weekly.    Personal Goal #2 Patient to improve diet quality by using the plate method as a guide for meal planning to include lean protein/plant protein, fruits, vegetables, whole grains, nonfat dairy as part of a well-balanced diet.    Comments Patient with medical history significant for CHF, HTN, hyperlipidemia, DM2. Pt reports diet consists largely of fried, high fat foods; verbalizes lack of desire to make healthy diet changes. Reports increasing early satiety since recent COVID infection. Typically consumes 2 small meals daily. RD encouraged pt to add 2-3 small snacks to help ensure adequate caloric intake; discussed using snacks as opportunity to increase intake of plant-based foods such as fruit and veggies. Encouraged pt to focus on ways to improve diet by adding nutrient-rich foods such as fruit/veggies and whole grains vs removing foods. Patient will benefit from participation in intensive cardiac rehab for nutrition education, exercise, and lifestyle modification.      Intervention Plan   Intervention Prescribe, educate and counsel regarding individualized specific dietary modifications aiming towards targeted core components such as weight, hypertension, lipid management, diabetes, heart failure and other comorbidities.;Nutrition handout(s) given to patient.   Handouts: Snack Ideas, My Plate Planner   Expected Outcomes Short Term Goal: Understand basic principles of dietary content,  such as calories, fat, sodium, cholesterol and nutrients.;Long Term Goal: Adherence to prescribed nutrition plan.          Nutrition Assessments:  MEDIFICTS Score Key: >=70 Need to make dietary changes  40-70 Heart Healthy Diet <= 40 Therapeutic Level Cholesterol Diet   Flowsheet Row INTENSIVE CARDIAC REHAB from 06/16/2024 in [facility] for Heart, Vascular, & Lung Health  Picture Your Plate Total Score on Admission 54   Picture Your Plate Scores: <59 Unhealthy dietary pattern with much room for improvement. 41-50 Dietary pattern unlikely to meet recommendations for good health and room for improvement. 51-60 More healthful dietary pattern, with some room for improvement.  >60 Healthy dietary pattern, although there may be some specific behaviors that could be improved.    Nutrition Goals Re-Evaluation:   Nutrition Goals Re-Evaluation:   Nutrition Goals Discharge (Final Nutrition Goals Re-Evaluation):   Psychosocial: Target Goals: Acknowledge presence or absence of significant depression and/or stress, maximize coping skills, provide positive support system. Participant is able to verbalize types and ability to use techniques and skills needed for reducing stress and depression.  Initial Review & Psychosocial Screening:  Initial Psych Review & Screening - 06/10/24 1412       Initial Review   Current issues with Current Sleep Concerns      Family Dynamics   Good Support System? --   Pt's wife with him today     Barriers   Psychosocial barriers to participate in program There are no identifiable barriers or psychosocial needs.  Screening Interventions   Interventions Encouraged to exercise    Expected Outcomes Short Term goal: Identification and review with participant of any Quality of Life or Depression concerns found by scoring the questionnaire.;Long Term goal: The participant improves quality of Life and PHQ9 Scores as seen by post  scores and/or verbalization of changes          Quality of Life Scores:  Quality of Life - 06/10/24 1428       Quality of Life   Select Quality of Life      Quality of Life Scores   Health/Function Pre 16.67 %    Socioeconomic Pre 21.67 %    Psych/Spiritual Pre 17.21 %    Family Pre 27.6 %    GLOBAL Pre 19.67 %         Scores of 19 and below usually indicate a poorer quality of life in these areas.  A difference of  2-3 points is a clinically meaningful difference.  A difference of 2-3 points in the total score of the Quality of Life Index has been associated with significant improvement in overall quality of life, self-image, physical symptoms, and general health in studies assessing change in quality of life.  PHQ-9: Review Flowsheet       06/10/2024 02/28/2021 01/22/2021 04/20/2013  Depression screen PHQ 2/9  Decreased Interest 0 0 0 0  Down, Depressed, Hopeless 0 0 0 0  PHQ - 2 Score 0 0 0 0  Altered sleeping 3 - 0 -  Tired, decreased energy 3 - 1 -  Change in appetite 0 - 0 -  Feeling bad or failure about yourself  0 - 0 -  Trouble concentrating 0 - 0 -  Moving slowly or fidgety/restless 0 - 0 -  Suicidal thoughts 0 - 0 -  PHQ-9 Score 6 - 1  -  Difficult doing work/chores Not difficult at all - Not difficult at all -    Details       Data saved with a previous flowsheet row definition        Interpretation of Total Score  Total Score Depression Severity:  1-4 = Minimal depression, 5-9 = Mild depression, 10-14 = Moderate depression, 15-19 = Moderately severe depression, 20-27 = Severe depression   Psychosocial Evaluation and Intervention:   Psychosocial Re-Evaluation:  Psychosocial Re-Evaluation     Row Name 07/06/24 1254             Psychosocial Re-Evaluation   Current issues with Current Sleep Concerns       Comments Discussed PHQ9 with the patient. Mason Patton says his sleep and energy levels are about thre same. Mason Patton says that he has trouble with his  left knee at times,       Interventions Stress management education;Relaxation education;Encouraged to attend Cardiac Rehabilitation for the exercise       Continue Psychosocial Services  Follow up required by staff          Psychosocial Discharge (Final Psychosocial Re-Evaluation):  Psychosocial Re-Evaluation - 07/06/24 1254       Psychosocial Re-Evaluation   Current issues with Current Sleep Concerns    Comments Discussed PHQ9 with the patient. Mason Patton says his sleep and energy levels are about thre same. Mason Patton says that he has trouble with his left knee at times,    Interventions Stress management education;Relaxation education;Encouraged to attend Cardiac Rehabilitation for the exercise    Continue Psychosocial Services  Follow up required by staff  Vocational Rehabilitation: Provide vocational rehab assistance to qualifying candidates.   Vocational Rehab Evaluation & Intervention:  Vocational Rehab - 06/10/24 1416       Initial Vocational Rehab Evaluation & Intervention   Assessment shows need for Vocational Rehabilitation --   Pt is retired         Education: Education Goals: Education classes will be provided on a weekly basis, covering required topics. Participant will state understanding/return demonstration of topics presented.    Education     Row Name 06/14/24 1100     Education   Cardiac Education Topics Pritikin   Psychologist, Forensic Exercise Education   Exercise Education Biomechanial Limitations   Instruction Review Code 1- Verbalizes Understanding   Class Start Time 1150   Class Stop Time 1224   Class Time Calculation (min) 34 min    Row Name 06/16/24 1100     Education   Cardiac Education Topics Pritikin   Orthoptist   Educator Dietitian   Weekly Topic Fast Evening Meals   Instruction Review Code 1- Verbalizes Understanding   Class Start Time 1148    Class Stop Time 1232   Class Time Calculation (min) 44 min    Row Name 06/18/24 1100     Education   Cardiac Education Topics Pritikin   Licensed Conveyancer Nutrition   Nutrition Vitamins and Minerals   Instruction Review Code 1- Verbalizes Understanding   Class Start Time 1148   Class Stop Time 1224   Class Time Calculation (min) 36 min    Row Name 06/21/24 1100     Education   Cardiac Education Topics Pritikin   Psychologist, Forensic Exercise Education   Exercise Education Improving Performance   Instruction Review Code 1- Verbalizes Understanding   Class Start Time 1150   Class Stop Time 1225   Class Time Calculation (min) 35 min      Core Videos: Exercise    Move It!  Clinical staff conducted group or individual video education with verbal and written material and guidebook.  Patient learns the recommended Pritikin exercise program. Exercise with the goal of living a long, healthy life. Some of the health benefits of exercise include controlled diabetes, healthier blood pressure levels, improved cholesterol levels, improved heart and lung capacity, improved sleep, and better body composition. Everyone should speak with their doctor before starting or changing an exercise routine.  Biomechanical Limitations Clinical staff conducted group or individual video education with verbal and written material and guidebook.  Patient learns how biomechanical limitations can impact exercise and how we can mitigate and possibly overcome limitations to have an impactful and balanced exercise routine.  Body Composition Clinical staff conducted group or individual video education with verbal and written material and guidebook.  Patient learns that body composition (ratio of muscle mass to fat mass) is a key component to assessing overall fitness, rather than body weight alone.  Increased fat mass, especially visceral belly fat, can put us  at increased risk for metabolic syndrome, type 2 diabetes, heart disease, and even death. It is recommended to combine diet and exercise (cardiovascular and resistance training) to improve your body composition. Seek guidance from your physician and exercise physiologist before implementing an exercise  routine.  Exercise Action Plan Clinical staff conducted group or individual video education with verbal and written material and guidebook.  Patient learns the recommended strategies to achieve and enjoy long-term exercise adherence, including variety, self-motivation, self-efficacy, and positive decision making. Benefits of exercise include fitness, good health, weight management, more energy, better sleep, less stress, and overall well-being.  Medical   Heart Disease Risk Reduction Clinical staff conducted group or individual video education with verbal and written material and guidebook.  Patient learns our heart is our most vital organ as it circulates oxygen, nutrients, white blood cells, and hormones throughout the entire body, and carries waste away. Data supports a plant-based eating plan like the Pritikin Program for its effectiveness in slowing progression of and reversing heart disease. The video provides a number of recommendations to address heart disease.   Metabolic Syndrome and Belly Fat  Clinical staff conducted group or individual video education with verbal and written material and guidebook.  Patient learns what metabolic syndrome is, how it leads to heart disease, and how one can reverse it and keep it from coming back. You have metabolic syndrome if you have 3 of the following 5 criteria: abdominal obesity, high blood pressure, high triglycerides, low HDL cholesterol, and high blood sugar.  Hypertension and Heart Disease Clinical staff conducted group or individual video education with verbal and written material and  guidebook.  Patient learns that high blood pressure, or hypertension, is very common in the [place] . Hypertension is largely due to excessive salt intake, but other important risk factors include being overweight, physical inactivity, drinking too much alcohol, smoking, and not eating enough potassium from fruits and vegetables. High blood pressure is a leading risk factor for heart attack, stroke, congestive heart failure, dementia, kidney failure, and premature death. Long-term effects of excessive salt intake include stiffening of the arteries and thickening of heart muscle and organ damage. Recommendations include ways to reduce hypertension and the risk of heart disease.  Diseases of Our Time - Focusing on Diabetes Clinical staff conducted group or individual video education with verbal and written material and guidebook.  Patient learns why the best way to stop diseases of our time is prevention, through food and other lifestyle changes. Medicine (such as prescription pills and surgeries) is often only a Band-Aid on the problem, not a long-term solution. Most common diseases of our time include obesity, type 2 diabetes, hypertension, heart disease, and cancer. The Pritikin Program is recommended and has been proven to help reduce, reverse, and/or prevent the damaging effects of metabolic syndrome.  Nutrition   Overview of the Pritikin Eating Plan  Clinical staff conducted group or individual video education with verbal and written material and guidebook.  Patient learns about the Pritikin Eating Plan for disease risk reduction. The Pritikin Eating Plan emphasizes a wide variety of unrefined, minimally-processed carbohydrates, like fruits, vegetables, whole grains, and legumes. Go, Caution, and Stop food choices are explained. Plant-based and lean animal proteins are emphasized. Rationale provided for low sodium intake for blood pressure control, low added sugars for blood sugar stabilization,  and low added fats and oils for coronary artery disease risk reduction and weight management.  Calorie Density  Clinical staff conducted group or individual video education with verbal and written material and guidebook.  Patient learns about calorie density and how it impacts the Pritikin Eating Plan. Knowing the characteristics of the food you choose will help you decide whether those foods will lead to weight gain or weight loss,  and whether you want to consume more or less of them. Weight loss is usually a side effect of the Pritikin Eating Plan because of its focus on low calorie-dense foods.  Label Reading  Clinical staff conducted group or individual video education with verbal and written material and guidebook.  Patient learns about the Pritikin recommended label reading guidelines and corresponding recommendations regarding calorie density, added sugars, sodium content, and whole grains.  Dining Out - Part 1  Clinical staff conducted group or individual video education with verbal and written material and guidebook.  Patient learns that restaurant meals can be sabotaging because they can be so high in calories, fat, sodium, and/or sugar. Patient learns recommended strategies on how to positively address this and avoid unhealthy pitfalls.  Facts on Fats  Clinical staff conducted group or individual video education with verbal and written material and guidebook.  Patient learns that lifestyle modifications can be just as effective, if not more so, as many medications for lowering your risk of heart disease. A Pritikin lifestyle can help to reduce your risk of inflammation and atherosclerosis (cholesterol build-up, or plaque, in the artery walls). Lifestyle interventions such as dietary choices and physical activity address the cause of atherosclerosis. A review of the types of fats and their impact on blood cholesterol levels, along with dietary recommendations to reduce fat intake is also  included.  Nutrition Action Plan  Clinical staff conducted group or individual video education with verbal and written material and guidebook.  Patient learns how to incorporate Pritikin recommendations into their lifestyle. Recommendations include planning and keeping personal health goals in mind as an important part of their success.  Healthy Mind-Set    Healthy Minds, Bodies, Hearts  Clinical staff conducted group or individual video education with verbal and written material and guidebook.  Patient learns how to identify when they are stressed. Video will discuss the impact of that stress, as well as the many benefits of stress management. Patient will also be introduced to stress management techniques. The way we think, act, and feel has an impact on our hearts.  How Our Thoughts Can Heal Our Hearts  Clinical staff conducted group or individual video education with verbal and written material and guidebook.  Patient learns that negative thoughts can cause depression and anxiety. This can result in negative lifestyle behavior and serious health problems. Cognitive behavioral therapy is an effective method to help control our thoughts in order to change and improve our emotional outlook.  Additional Videos:  Exercise    Improving Performance  Clinical staff conducted group or individual video education with verbal and written material and guidebook.  Patient learns to use a non-linear approach by alternating intensity levels and lengths of time spent exercising to help burn more calories and lose more body fat. Cardiovascular exercise helps improve heart health, metabolism, hormonal balance, blood sugar control, and recovery from fatigue. Resistance training improves strength, endurance, balance, coordination, reaction time, metabolism, and muscle mass. Flexibility exercise improves circulation, posture, and balance. Seek guidance from your physician and exercise physiologist before  implementing an exercise routine and learn your capabilities and proper form for all exercise.  Introduction to Yoga  Clinical staff conducted group or individual video education with verbal and written material and guidebook.  Patient learns about yoga, a discipline of the coming together of mind, breath, and body. The benefits of yoga include improved flexibility, improved range of motion, better posture and core strength, increased lung function, weight loss, and positive self-image.  Yogas heart health benefits include lowered blood pressure, healthier heart rate, decreased cholesterol and triglyceride levels, improved immune function, and reduced stress. Seek guidance from your physician and exercise physiologist before implementing an exercise routine and learn your capabilities and proper form for all exercise.  Medical   Aging: Enhancing Your Quality of Life  Clinical staff conducted group or individual video education with verbal and written material and guidebook.  Patient learns key strategies and recommendations to stay in good physical health and enhance quality of life, such as prevention strategies, having an advocate, securing a Health Care Proxy and Power of Attorney, and keeping a list of medications and system for tracking them. It also discusses how to avoid risk for bone loss.  Biology of Weight Control  Clinical staff conducted group or individual video education with verbal and written material and guidebook.  Patient learns that weight gain occurs because we consume more calories than we burn (eating more, moving less). Even if your body weight is normal, you may have higher ratios of fat compared to muscle mass. Too much body fat puts you at increased risk for cardiovascular disease, heart attack, stroke, type 2 diabetes, and obesity-related cancers. In addition to exercise, following the Pritikin Eating Plan can help reduce your risk.  Decoding Lab Results  Clinical staff  conducted group or individual video education with verbal and written material and guidebook.  Patient learns that lab test reflects one measurement whose values change over time and are influenced by many factors, including medication, stress, sleep, exercise, food, hydration, pre-existing medical conditions, and more. It is recommended to use the knowledge from this video to become more involved with your lab results and evaluate your numbers to speak with your doctor.   Diseases of Our Time - Overview  Clinical staff conducted group or individual video education with verbal and written material and guidebook.  Patient learns that according to the CDC, 50% to 70% of chronic diseases (such as obesity, type 2 diabetes, elevated lipids, hypertension, and heart disease) are avoidable through lifestyle improvements including healthier food choices, listening to satiety cues, and increased physical activity.  Sleep Disorders Clinical staff conducted group or individual video education with verbal and written material and guidebook.  Patient learns how good quality and duration of sleep are important to overall health and well-being. Patient also learns about sleep disorders and how they impact health along with recommendations to address them, including discussing with a physician.  Nutrition  Dining Out - Part 2 Clinical staff conducted group or individual video education with verbal and written material and guidebook.  Patient learns how to plan ahead and communicate in order to maximize their dining experience in a healthy and nutritious manner. Included are recommended food choices based on the type of restaurant the patient is visiting.   Fueling a Banker conducted group or individual video education with verbal and written material and guidebook.  There is a strong connection between our food choices and our health. Diseases like obesity and type 2 diabetes are very  prevalent and are in large-part due to lifestyle choices. The Pritikin Eating Plan provides plenty of food and hunger-curbing satisfaction. It is easy to follow, affordable, and helps reduce health risks.  Menu Workshop  Clinical staff conducted group or individual video education with verbal and written material and guidebook.  Patient learns that restaurant meals can sabotage health goals because they are often packed with calories, fat, sodium, and sugar. Recommendations  include strategies to plan ahead and to communicate with the manager, chef, or server to help order a healthier meal.  Planning Your Eating Strategy  Clinical staff conducted group or individual video education with verbal and written material and guidebook.  Patient learns about the Pritikin Eating Plan and its benefit of reducing the risk of disease. The Pritikin Eating Plan does not focus on calories. Instead, it emphasizes high-quality, nutrient-rich foods. By knowing the characteristics of the foods, we choose, we can determine their calorie density and make informed decisions.  Targeting Your Nutrition Priorities  Clinical staff conducted group or individual video education with verbal and written material and guidebook.  Patient learns that lifestyle habits have a tremendous impact on disease risk and progression. This video provides eating and physical activity recommendations based on your personal health goals, such as reducing LDL cholesterol, losing weight, preventing or controlling type 2 diabetes, and reducing high blood pressure.  Vitamins and Minerals  Clinical staff conducted group or individual video education with verbal and written material and guidebook.  Patient learns different ways to obtain key vitamins and minerals, including through a recommended healthy diet. It is important to discuss all supplements you take with your doctor.   Healthy Mind-Set    Smoking Cessation  Clinical staff conducted group  or individual video education with verbal and written material and guidebook.  Patient learns that cigarette smoking and tobacco addiction pose a serious health risk which affects millions of people. Stopping smoking will significantly reduce the risk of heart disease, lung disease, and many forms of cancer. Recommended strategies for quitting are covered, including working with your doctor to develop a successful plan.  Culinary   Becoming a Set Designer conducted group or individual video education with verbal and written material and guidebook.  Patient learns that cooking at home can be healthy, cost-effective, quick, and puts them in control. Keys to cooking healthy recipes will include looking at your recipe, assessing your equipment needs, planning ahead, making it simple, choosing cost-effective seasonal ingredients, and limiting the use of added fats, salts, and sugars.  Cooking - Breakfast and Snacks  Clinical staff conducted group or individual video education with verbal and written material and guidebook.  Patient learns how important breakfast is to satiety and nutrition through the entire day. Recommendations include key foods to eat during breakfast to help stabilize blood sugar levels and to prevent overeating at meals later in the day. Planning ahead is also a key component.  Cooking - Educational Psychologist conducted group or individual video education with verbal and written material and guidebook.  Patient learns eating strategies to improve overall health, including an approach to cook more at home. Recommendations include thinking of animal protein as a side on your plate rather than center stage and focusing instead on lower calorie dense options like vegetables, fruits, whole grains, and plant-based proteins, such as beans. Making sauces in large quantities to freeze for later and leaving the skin on your vegetables are also recommended to maximize  your experience.  Cooking - Healthy Salads and Dressing Clinical staff conducted group or individual video education with verbal and written material and guidebook.  Patient learns that vegetables, fruits, whole grains, and legumes are the foundations of the Pritikin Eating Plan. Recommendations include how to incorporate each of these in flavorful and healthy salads, and how to create homemade salad dressings. Proper handling of ingredients is also covered. Cooking - Soups and Desserts  Cooking - Soups and Desserts Clinical staff conducted group or individual video education with verbal and written material and guidebook.  Patient learns that Pritikin soups and desserts make for easy, nutritious, and delicious snacks and meal components that are low in sodium, fat, sugar, and calorie density, while high in vitamins, minerals, and filling fiber. Recommendations include simple and healthy ideas for soups and desserts.   Overview     The Pritikin Solution Program Overview Clinical staff conducted group or individual video education with verbal and written material and guidebook.  Patient learns that the results of the Pritikin Program have been documented in more than 100 articles published in peer-reviewed journals, and the benefits include reducing risk factors for (and, in some cases, even reversing) high cholesterol, high blood pressure, type 2 diabetes, obesity, and more! An overview of the three key pillars of the Pritikin Program will be covered: eating well, doing regular exercise, and having a healthy mind-set.  WORKSHOPS  Exercise: Exercise Basics: Building Your Action Plan Clinical staff led group instruction and group discussion with PowerPoint presentation and patient guidebook. To enhance the learning environment the use of posters, models and videos may be added. At the conclusion of this workshop, patients will comprehend the difference between physical activity and exercise, as well  as the benefits of incorporating both, into their routine. Patients will understand the FITT (Frequency, Intensity, Time, and Type) principle and how to use it to build an exercise action plan. In addition, safety concerns and other considerations for exercise and cardiac rehab will be addressed by the presenter. The purpose of this lesson is to promote a comprehensive and effective weekly exercise routine in order to improve patients overall level of fitness.   Managing Heart Disease: Your Path to a Healthier Heart Clinical staff led group instruction and group discussion with PowerPoint presentation and patient guidebook. To enhance the learning environment the use of posters, models and videos may be added.At the conclusion of this workshop, patients will understand the anatomy and physiology of the heart. Additionally, they will understand how Pritikins three pillars impact the risk factors, the progression, and the management of heart disease.  The purpose of this lesson is to provide a high-level overview of the heart, heart disease, and how the Pritikin lifestyle positively impacts risk factors.  Exercise Biomechanics Clinical staff led group instruction and group discussion with PowerPoint presentation and patient guidebook. To enhance the learning environment the use of posters, models and videos may be added. Patients will learn how the structural parts of their bodies function and how these functions impact their daily activities, movement, and exercise. Patients will learn how to promote a neutral spine, learn how to manage pain, and identify ways to improve their physical movement in order to promote healthy living. The purpose of this lesson is to expose patients to common physical limitations that impact physical activity. Participants will learn practical ways to adapt and manage aches and pains, and to minimize their effect on regular exercise. Patients will learn how to  maintain good posture while sitting, walking, and lifting.  Balance Training and Fall Prevention  Clinical staff led group instruction and group discussion with PowerPoint presentation and patient guidebook. To enhance the learning environment the use of posters, models and videos may be added. At the conclusion of this workshop, patients will understand the importance of their sensorimotor skills (vision, proprioception, and the vestibular system) in maintaining their ability to balance as they age. Patients will apply a variety  of balancing exercises that are appropriate for their current level of function. Patients will understand the common causes for poor balance, possible solutions to these problems, and ways to modify their physical environment in order to minimize their fall risk. The purpose of this lesson is to teach patients about the importance of maintaining balance as they age and ways to minimize their risk of falling.  WORKSHOPS   Nutrition:  Fueling a Ship Broker led group instruction and group discussion with PowerPoint presentation and patient guidebook. To enhance the learning environment the use of posters, models and videos may be added. Patients will review the foundational principles of the Pritikin Eating Plan and understand what constitutes a serving size in each of the food groups. Patients will also learn Pritikin-friendly foods that are better choices when away from home and review make-ahead meal and snack options. Calorie density will be reviewed and applied to three nutrition priorities: weight maintenance, weight loss, and weight gain. The purpose of this lesson is to reinforce (in a group setting) the key concepts around what patients are recommended to eat and how to apply these guidelines when away from home by planning and selecting Pritikin-friendly options. Patients will understand how calorie density may be adjusted for different weight management  goals.  Mindful Eating  Clinical staff led group instruction and group discussion with PowerPoint presentation and patient guidebook. To enhance the learning environment the use of posters, models and videos may be added. Patients will briefly review the concepts of the Pritikin Eating Plan and the importance of low-calorie dense foods. The concept of mindful eating will be introduced as well as the importance of paying attention to internal hunger signals. Triggers for non-hunger eating and techniques for dealing with triggers will be explored. The purpose of this lesson is to provide patients with the opportunity to review the basic principles of the Pritikin Eating Plan, discuss the value of eating mindfully and how to measure internal cues of hunger and fullness using the Hunger Scale. Patients will also discuss reasons for non-hunger eating and learn strategies to use for controlling emotional eating.  Targeting Your Nutrition Priorities Clinical staff led group instruction and group discussion with PowerPoint presentation and patient guidebook. To enhance the learning environment the use of posters, models and videos may be added. Patients will learn how to determine their genetic susceptibility to disease by reviewing their family history. Patients will gain insight into the importance of diet as part of an overall healthy lifestyle in mitigating the impact of genetics and other environmental insults. The purpose of this lesson is to provide patients with the opportunity to assess their personal nutrition priorities by looking at their family history, their own health history and current risk factors. Patients will also be able to discuss ways of prioritizing and modifying the Pritikin Eating Plan for their highest risk areas  Menu  Clinical staff led group instruction and group discussion with PowerPoint presentation and patient guidebook. To enhance the learning environment the use of posters,  models and videos may be added. Using menus brought in from [employer], or printed from [employer], patients will apply the Pritikin dining out guidelines that were presented in the [employer] video. Patients will also be able to practice these guidelines in a variety of provided scenarios. The purpose of this lesson is to provide patients with the opportunity to practice hands-on learning of the Pritikin Dining Out guidelines with actual menus and practice scenarios.  Label Reading Clinical staff led group instruction and group discussion with PowerPoint presentation and patient guidebook. To enhance the learning environment the use of posters, models and videos may be added. Patients will review and discuss the Pritikin label reading guidelines presented in Pritikins Label Reading Educational series video. Using fool labels brought in from local grocery stores and markets, patients will apply the label reading guidelines and determine if the packaged food meet the Pritikin guidelines. The purpose of this lesson is to provide patients with the opportunity to review, discuss, and practice hands-on learning of the Pritikin Label Reading guidelines with actual packaged food labels. Cooking School  Pritikins [employer] are designed to teach patients ways to prepare quick, simple, and affordable recipes at home. The importance of nutritions role in chronic disease risk reduction is reflected in its emphasis in the overall Pritikin program. By learning how to prepare essential core Pritikin Eating Plan recipes, patients will increase control over what they eat; be able to customize the flavor of foods without the use of added salt, sugar, or fat; and improve the quality of the food they consume. By learning a set of core recipes which are easily assembled, quickly prepared, and affordable, patients are more likely to prepare more healthy foods at home. These workshops  focus on convenient breakfasts, simple entres, side dishes, and desserts which can be prepared with minimal effort and are consistent with nutrition recommendations for cardiovascular risk reduction. Cooking [employer] are taught by a armed forces logistics/support/administrative officer (RD) who has been trained by the [employer]. The chef or RD has a clear understanding of the importance of minimizing - if not completely eliminating - added fat, sugar, and sodium in recipes. Throughout the series of Cooking School Workshop sessions, patients will learn about healthy ingredients and efficient methods of cooking to build confidence in their capability to prepare    Cooking School weekly topics:  Adding Flavor- Sodium-Free  Fast and Healthy Breakfasts  Powerhouse Plant-Based Proteins  Satisfying Salads and Dressings  Simple Sides and Sauces  International Cuisine-Spotlight on the [employer] Zones  Delicious Desserts  Savory Soups  [employer] - Meals in a Astronomer Appetizers and Snacks  Comforting Weekend Breakfasts  One-Pot Wonders   Fast Evening Meals  Landscape Architect Your Pritikin Plate  WORKSHOPS   Healthy Mindset (Psychosocial):  Focused Goals, Sustainable Changes Clinical staff led group instruction and group discussion with PowerPoint presentation and patient guidebook. To enhance the learning environment the use of posters, models and videos may be added. Patients will be able to apply effective goal setting strategies to establish at least one personal goal, and then take consistent, meaningful action toward that goal. They will learn to identify common barriers to achieving personal goals and develop strategies to overcome them. Patients will also gain an understanding of how our mind-set can impact our ability to achieve goals and the importance of cultivating a positive and growth-oriented mind-set. The purpose of this lesson is to provide patients with a deeper  understanding of how to set and achieve personal goals, as well as the tools and strategies needed to overcome common obstacles which may arise along the way.  From Head to Heart: The Power of a Healthy Outlook  Clinical staff led group instruction and group discussion with PowerPoint presentation and patient guidebook. To enhance the learning environment the use of posters, models and videos may be added. Patients will be able to recognize and  describe the impact of emotions and mood on physical health. They will discover the importance of self-care and explore self-care practices which may work for them. Patients will also learn how to utilize the 4 Cs to cultivate a healthier outlook and better manage stress and challenges. The purpose of this lesson is to demonstrate to patients how a healthy outlook is an essential part of maintaining good health, especially as they continue their cardiac rehab journey.  Healthy Sleep for a Healthy Heart Clinical staff led group instruction and group discussion with PowerPoint presentation and patient guidebook. To enhance the learning environment the use of posters, models and videos may be added. At the conclusion of this workshop, patients will be able to demonstrate knowledge of the importance of sleep to overall health, well-being, and quality of life. They will understand the symptoms of, and treatments for, common sleep disorders. Patients will also be able to identify daytime and nighttime behaviors which impact sleep, and they will be able to apply these tools to help manage sleep-related challenges. The purpose of this lesson is to provide patients with a general overview of sleep and outline the importance of quality sleep. Patients will learn about a few of the most common sleep disorders. Patients will also be introduced to the concept of sleep hygiene, and discover ways to self-manage certain sleeping problems through simple daily behavior changes.  Finally, the workshop will motivate patients by clarifying the links between quality sleep and their goals of heart-healthy living.   Recognizing and Reducing Stress Clinical staff led group instruction and group discussion with PowerPoint presentation and patient guidebook. To enhance the learning environment the use of posters, models and videos may be added. At the conclusion of this workshop, patients will be able to understand the types of stress reactions, differentiate between acute and chronic stress, and recognize the impact that chronic stress has on their health. They will also be able to apply different coping mechanisms, such as reframing negative self-talk. Patients will have the opportunity to practice a variety of stress management techniques, such as deep abdominal breathing, progressive muscle relaxation, and/or guided imagery.  The purpose of this lesson is to educate patients on the role of stress in their lives and to provide healthy techniques for coping with it.  Learning Barriers/Preferences:  Learning Barriers/Preferences - 06/10/24 1415       Learning Barriers/Preferences   Learning Barriers Sight;Hearing   wears glasses, HOH   Learning Preferences Computer/Internet;Audio;Group Instruction;Individual Instruction;Pictoral;Skilled Demonstration;Verbal Instruction;Video;Written Material          Education Topics:  Knowledge Questionnaire Score:  Knowledge Questionnaire Score - 06/10/24 1437       Knowledge Questionnaire Score   Pre Score 26/28          Core Components/Risk Factors/Patient Goals at Admission:  Personal Goals and Risk Factors at Admission - 06/10/24 1416       Core Components/Risk Factors/Patient Goals on Admission    Weight Management Yes;Obesity    Intervention Weight Management/Obesity: Establish reasonable short term and long term weight goals.;Obesity: Provide education and appropriate resources to help participant work on and attain  dietary goals.    Expected Outcomes Short Term: Continue to assess and modify interventions until short term weight is achieved;Long Term: Adherence to nutrition and physical activity/exercise program aimed toward attainment of established weight goal;Weight Loss: Understanding of general recommendations for a balanced deficit meal plan, which promotes 1-2 lb weight loss per week and includes a negative energy balance of [phone]  kcal/d;Understanding recommendations for meals to include 15-35% energy as protein, 25-35% energy from fat, 35-60% energy from carbohydrates, less than 200mg  of dietary cholesterol, 20-35 gm of total fiber daily;Understanding of distribution of calorie intake throughout the day with the consumption of 4-5 meals/snacks    Tobacco Cessation Yes    Number of packs per day 3-4 cigarettes per day    Intervention Assist the participant in steps to quit. Provide individualized education and counseling about committing to Tobacco Cessation, relapse prevention, and pharmacological support that can be provided by physician.;Education officer, environmental, assist with locating and accessing local/national Quit Smoking programs, and support quit date choice.    Expected Outcomes Short Term: Will demonstrate readiness to quit, by selecting a quit date.;Long Term: Complete abstinence from all tobacco products for at least 12 months from quit date.;Short Term: Will quit all tobacco product use, adhering to prevention of relapse plan.    Diabetes Yes    Intervention Provide education about signs/symptoms and action to take for hypo/hyperglycemia.;Provide education about proper nutrition, including hydration, and aerobic/resistive exercise prescription along with prescribed medications to achieve blood glucose in normal ranges: Fasting glucose 65-99 mg/dL    Expected Outcomes Short Term: Participant verbalizes understanding of the signs/symptoms and immediate care of hyper/hypoglycemia, proper foot  care and importance of medication, aerobic/resistive exercise and nutrition plan for blood glucose control.;Long Term: Attainment of HbA1C < 7%.    Heart Failure Yes    Intervention Provide a combined exercise and nutrition program that is supplemented with education, support and counseling about heart failure. Directed toward relieving symptoms such as shortness of breath, decreased exercise tolerance, and extremity edema.    Expected Outcomes Improve functional capacity of life;Short term: Attendance in program 2-3 days a week with increased exercise capacity. Reported lower sodium intake. Reported increased fruit and vegetable intake. Reports medication compliance.;Short term: Daily weights obtained and reported for increase. Utilizing diuretic protocols set by physician.;Long term: Adoption of self-care skills and reduction of barriers for early signs and symptoms recognition and intervention leading to self-care maintenance.    Hypertension Yes    Intervention Provide education on lifestyle modifcations including regular physical activity/exercise, weight management, moderate sodium restriction and increased consumption of fresh fruit, vegetables, and low fat dairy, alcohol moderation, and smoking cessation.;Monitor prescription use compliance.    Expected Outcomes Short Term: Continued assessment and intervention until BP is < 140/29mm HG in hypertensive participants. < 130/7mm HG in hypertensive participants with diabetes, heart failure or chronic kidney disease.;Long Term: Maintenance of blood pressure at goal levels.    Lipids Yes    Intervention Provide education and support for participant on nutrition & aerobic/resistive exercise along with prescribed medications to achieve LDL 70mg , HDL >40mg .    Expected Outcomes Short Term: Participant states understanding of desired cholesterol values and is compliant with medications prescribed. Participant is following exercise prescription and nutrition  guidelines.;Long Term: Cholesterol controlled with medications as prescribed, with individualized exercise RX and with personalized nutrition plan. Value goals: LDL < 70mg , HDL > 40 mg.          Core Components/Risk Factors/Patient Goals Review:   Goals and Risk Factor Review     Row Name 07/06/24 1258             Core Components/Risk Factors/Patient Goals Review   Personal Goals Review Weight Management/Obesity;Tobacco Cessation;Diabetes;Lipids;Hypertension       Review Mason Patton has been doing fair with exercise for his fitness level. Vital signs and CBG's have been stable.  Will encourage smoking cessation.       Expected Outcomes Mason Patton will continue to participate in cardiac rehab for exercise, nutrition and lifestyle modifications          Core Components/Risk Factors/Patient Goals at Discharge (Final Review):   Goals and Risk Factor Review - 07/06/24 1258       Core Components/Risk Factors/Patient Goals Review   Personal Goals Review Weight Management/Obesity;Tobacco Cessation;Diabetes;Lipids;Hypertension    Review Mason Patton has been doing fair with exercise for his fitness level. Vital signs and CBG's have been stable. Will encourage smoking cessation.    Expected Outcomes Mason Patton will continue to participate in cardiac rehab for exercise, nutrition and lifestyle modifications          ITP Comments:  ITP Comments     Row Name 06/10/24 1038 07/06/24 1252         ITP Comments Wilbert Bihari, MD: Medical Director. Introduction to the [facility]/Intensive [facility]iac Rehab. Initial orientation packet reviewed with patient. 30 Day ITP Review. Mason Patton has good participation in cardiac rehab when in attendance         Comments: See ITP Comments     [1]  Current Outpatient Medications:    apixaban  (ELIQUIS ) 5 MG TABS tablet, Take 5 mg by mouth 2 (two) times daily., Disp: , Rfl:    aspirin  EC 81 MG EC tablet, Take 1 tablet (81 mg total) by mouth daily. Swallow whole., Disp: 30  tablet, Rfl: 11   atorvastatin  (LIPITOR) 40 MG tablet, Take 40 mg by mouth daily in the afternoon. (Patient taking differently: Take 80 mg by mouth daily in the afternoon.), Disp: , Rfl:    cholecalciferol  (VITAMIN D3) 25 MCG (1000 UNIT) tablet, Take 1,000 Units by mouth daily. (Patient taking differently: Take 2,000 Units by mouth daily.), Disp: , Rfl:    Cyanocobalamin  (VITAMIN B12) 500 MCG TABS, Take 500 mcg by mouth 2 (two) times daily., Disp: , Rfl:    DENTA 5000 PLUS 1.1 % CREA dental cream, Place 1 Application onto teeth at bedtime., Disp: , Rfl:    ezetimibe (ZETIA) 10 MG tablet, Take 10 mg by mouth daily., Disp: , Rfl:    insulin  glargine (LANTUS ) 100 UNIT/ML injection, Inject 12 Units into the skin daily., Disp: , Rfl:    levothyroxine  (SYNTHROID ) 200 MCG tablet, Take 1 tablet (200 mcg total) by mouth daily., Disp: 30 tablet, Rfl: 0   metFORMIN  (GLUCOPHAGE ) 500 MG tablet, Take 1,000 mg by mouth 2 (two) times daily with a meal. (Patient not taking: Reported on 06/10/2024), Disp: , Rfl:    metoprolol  succinate (TOPROL -XL) 100 MG 24 hr tablet, Take 1 tablet (100 mg total) by mouth daily. Take with or immediately following a meal. (Patient taking differently: Take 200 mg by mouth daily. Take with or immediately following a meal.), Disp: 30 tablet, Rfl: 11   Multiple Vitamins-Minerals (CENTRUM SILVER 50+MEN) TABS, Take 1 tablet by mouth daily with breakfast., Disp: , Rfl:    Multiple Vitamins-Minerals (PRESERVISION AREDS) TABS, Take 1 tablet by mouth in the morning and at bedtime., Disp: , Rfl:    nitroGLYCERIN  (NITROSTAT ) 0.4 MG SL tablet, Place 0.4 mg under the tongue every 5 (five) minutes as needed for chest pain., Disp: , Rfl:    nortriptyline (PAMELOR) 10 MG capsule, Take 10 mg by mouth See admin instructions. Take 10 mg by mouth 30-45 minutes prior to bedtime for overactive intestinal muscles, Disp: , Rfl:    nystatin  (MYCOSTATIN ) 100000 UNIT/ML suspension, Use as directed  5 mLs (500,000  Units total) in the mouth or throat 4 (four) times daily. (Patient not taking: Reported on 06/10/2024), Disp: 56 mL, Rfl: 0   omeprazole (PRILOSEC) 20 MG capsule, Take 20 mg by mouth daily before breakfast., Disp: , Rfl:    potassium chloride  SA (KLOR-CON  M) 20 MEQ tablet, Take 20 mEq by mouth 2 (two) times daily., Disp: , Rfl:    sacubitril -valsartan  (ENTRESTO ) 49-51 MG, Take 1 tablet by mouth 2 (two) times daily., Disp: 60 tablet, Rfl: 5   spironolactone  (ALDACTONE ) 25 MG tablet, Take 0.5 tablets (12.5 mg total) by mouth daily. (Patient taking differently: Take 25 mg by mouth daily.), Disp: 45 tablet, Rfl: 3   sulfamethoxazole -trimethoprim  (BACTRIM  DS) 800-160 MG tablet, Take 1 tablet by mouth 2 (two) times daily. (Patient not taking: Reported on 06/10/2024), Disp: 20 tablet, Rfl: 0   torsemide  (DEMADEX ) 20 MG tablet, Take 20 mg by mouth See admin instructions. Take 20 mg by mouth in the morning and may take an additional 20 mg once a day as directed for a period of 3 days, for an overnight weight gain of 3 to 5 pounds, Disp: 72 tablet, Rfl: 0   umeclidinium bromide  (INCRUSE ELLIPTA ) 62.5 MCG/ACT AEPB, Inhale 1 puff into the lungs daily. (Patient not taking: Reported on 06/10/2024), Disp: 30 each, Rfl: 2   VENTOLIN  HFA 108 (90 Base) MCG/ACT inhaler, Inhale 1-2 puffs into the lungs every 4 (four) hours as needed for wheezing or shortness of breath. , Disp: , Rfl:  [2]  Social History Tobacco Use  Smoking Status Every Day   Current packs/day: 0.50   Average packs/day: 0.5 packs/day for 50.0 years (25.0 ttl pk-yrs)   Types: Cigarettes   Passive exposure: Never  Smokeless Tobacco Never

## 2024-07-07 ENCOUNTER — Encounter (HOSPITAL_COMMUNITY)
Admission: RE | Admit: 2024-07-07 | Discharge: 2024-07-07 | Disposition: A | Discharge status: Home / Self Care | Source: Ambulatory Visit | Attending: Cardiology | Admitting: Cardiology

## 2024-07-07 DIAGNOSIS — I5042 Chronic combined systolic (congestive) and diastolic (congestive) heart failure: Secondary | ICD-10-CM | POA: Diagnosis not present

## 2024-07-09 ENCOUNTER — Encounter (HOSPITAL_COMMUNITY)

## 2024-07-12 ENCOUNTER — Encounter (HOSPITAL_COMMUNITY)
Admission: RE | Admit: 2024-07-12 | Discharge: 2024-07-12 | Disposition: A | Discharge status: Home / Self Care | Source: Ambulatory Visit | Attending: Cardiology | Admitting: Cardiology

## 2024-07-12 DIAGNOSIS — I5042 Chronic combined systolic (congestive) and diastolic (congestive) heart failure: Secondary | ICD-10-CM | POA: Insufficient documentation

## 2024-07-12 DIAGNOSIS — Z5189 Encounter for other specified aftercare: Secondary | ICD-10-CM | POA: Insufficient documentation

## 2024-07-14 ENCOUNTER — Encounter (HOSPITAL_COMMUNITY)
Admission: RE | Admit: 2024-07-14 | Discharge: 2024-07-14 | Disposition: A | Discharge status: Home / Self Care | Source: Ambulatory Visit | Attending: Cardiology | Admitting: Cardiology

## 2024-07-14 DIAGNOSIS — I5042 Chronic combined systolic (congestive) and diastolic (congestive) heart failure: Secondary | ICD-10-CM

## 2024-07-16 ENCOUNTER — Encounter (HOSPITAL_COMMUNITY)
Admission: RE | Admit: 2024-07-16 | Discharge: 2024-07-16 | Disposition: A | Source: Ambulatory Visit | Attending: Cardiology

## 2024-07-16 DIAGNOSIS — I5042 Chronic combined systolic (congestive) and diastolic (congestive) heart failure: Secondary | ICD-10-CM

## 2024-07-16 NOTE — Progress Notes (Signed)
 Reviewed home exercise guidelines with Nancyann including endpoints, temperature precautions, target heart rate and rate of perceived exertion. He is doing some walking and has a seated elliptical machine that he uses as his mode of home exercise. Nickie voices understanding of instructions given.  Arnoldo CHRISTELLA Gal, MS, ACSM CEP

## 2024-07-19 ENCOUNTER — Encounter (HOSPITAL_COMMUNITY)
Admission: RE | Admit: 2024-07-19 | Discharge: 2024-07-19 | Disposition: A | Source: Ambulatory Visit | Attending: Cardiology

## 2024-07-19 DIAGNOSIS — I5042 Chronic combined systolic (congestive) and diastolic (congestive) heart failure: Secondary | ICD-10-CM

## 2024-07-21 ENCOUNTER — Encounter (HOSPITAL_COMMUNITY)
Admission: RE | Admit: 2024-07-21 | Discharge: 2024-07-21 | Disposition: A | Discharge status: Home / Self Care | Source: Ambulatory Visit | Attending: Cardiology | Admitting: Cardiology

## 2024-07-21 DIAGNOSIS — I5042 Chronic combined systolic (congestive) and diastolic (congestive) heart failure: Secondary | ICD-10-CM

## 2024-07-23 ENCOUNTER — Encounter (HOSPITAL_COMMUNITY)
Admission: RE | Admit: 2024-07-23 | Discharge: 2024-07-23 | Disposition: A | Discharge status: Home / Self Care | Source: Ambulatory Visit | Attending: Cardiology | Admitting: Cardiology

## 2024-07-23 DIAGNOSIS — I5042 Chronic combined systolic (congestive) and diastolic (congestive) heart failure: Secondary | ICD-10-CM

## 2024-07-26 ENCOUNTER — Encounter (HOSPITAL_COMMUNITY): Admission: RE | Admit: 2024-07-26 | Source: Ambulatory Visit

## 2024-07-26 ENCOUNTER — Telehealth (HOSPITAL_COMMUNITY): Payer: Self-pay

## 2024-07-26 NOTE — Telephone Encounter (Signed)
 Patient c/o for 10:15 CR class, no reason given.

## 2024-07-28 ENCOUNTER — Encounter (HOSPITAL_COMMUNITY)

## 2024-07-28 ENCOUNTER — Telehealth (HOSPITAL_COMMUNITY): Payer: Self-pay

## 2024-07-28 NOTE — Telephone Encounter (Signed)
 Patient c/o for 10:15 CR class, no reason given.

## 2024-07-30 ENCOUNTER — Encounter (HOSPITAL_COMMUNITY)

## 2024-07-30 ENCOUNTER — Telehealth (HOSPITAL_COMMUNITY): Payer: Self-pay

## 2024-07-30 NOTE — Telephone Encounter (Signed)
 Patient c/o for 10:15 CR class, no reason given.

## 2024-08-02 ENCOUNTER — Encounter (HOSPITAL_COMMUNITY)

## 2024-08-03 ENCOUNTER — Telehealth (HOSPITAL_COMMUNITY): Payer: Self-pay

## 2024-08-03 ENCOUNTER — Telehealth (HOSPITAL_COMMUNITY): Payer: Self-pay | Admitting: *Deleted

## 2024-08-03 NOTE — Telephone Encounter (Signed)
 Left message to call cardiac rehab. Called regarding attendance.Hadassah Elpidio Quan RN BSN

## 2024-08-03 NOTE — Progress Notes (Signed)
 Cardiac Individual Treatment Plan  Patient Details  Name: Mason Patton MRN: [id number] Date of Birth: July 28, 1945 Referring Provider:   Flowsheet Row INTENSIVE CARDIAC REHAB ORIENT from 06/10/2024 in [facility] for Heart, Vascular, & Lung Health  Referring Provider Dr. Edda Holland, MD covering)    Initial Encounter Date:  Flowsheet Row INTENSIVE CARDIAC REHAB ORIENT from 06/10/2024 in [facility] for Heart, Vascular, & Lung Health  Date 06/10/24    Visit Diagnosis: Heart failure, systolic and diastolic, chronic (HCC)  Patient's Home Medications on Admission: Current Medications[1]  Past Medical History: Past Medical History:  Diagnosis Date   Aortic stenosis    09/30/19 echo ([facility], Cardiologist Dr. Lucillie Call): Mild-moderate AS, MaxVel 308 cm/s, MeanPG 22 mmHg, AVA 1.6 cm2   Arthritis    Atrial fibrillation (HCC)    Carotid artery stenosis    11/03/19 US  ([facility]): 50-69% RICA stenosis   Cholecystitis with cholelithiasis    Eczema    Emphysema of lung (HCC)    Gallstones and inflammation of gallbladder without obstruction    Heart murmur    History of nuclear stress test 02/23/2010   dipyridamole ; normal pattern of perfusion in all regions; normal, low risk study    Hyperlipidemia    Hypertension    Hypothyroidism    had thyroid  killed w/radiation (05/19/2013)   Meningoencephalitis    Obesity    OSA on CPAP    last sleep study >8 yrs   Pneumonia    twice (05/19/2013)   Shortness of breath    Stroke [facility])    they said I had a minor one when I had menigitis (05/19/2013)   Thyroid  disease    Tobacco abuse    quit 11/08/2012   Type II diabetes mellitus (HCC)    Umbilical hernia     Tobacco Use: Tobacco Use History[2]  Labs: Review Flowsheet  More data exists      Latest Ref Rng & Units 09/07/2020 09/11/2020 11/05/2020 05/18/2021 02/01/2022  Labs for ITP Cardiac and Pulmonary Rehab   Cholestrol 0 - 200 mg/dL - - - - 82   LDL (calc) 0 - 99 mg/dL - - - - 46   HDL-C >59 mg/dL - - - - 20   Trlycerides <150 mg/dL - - - - 78   Hemoglobin A1c 4.8 - 5.6 % - - - 6.4  5.7   PH, Arterial 7.350 - 7.450 7.454  7.438  7.428  7.422  7.381  7.333  7.392  7.474  7.319  7.374  - - -  PCO2 arterial 32.0 - 48.0 mmHg 41.3  34.0  34.1  35.1  40.1  49.3  45.1  37.2  53.9  45.4  - - -  Bicarbonate 20.0 - 28.0 mmol/L 29.0  30.0  29.7  23.1  22.6  22.9  23.8  26.2  27.5  27.3  27.7  26.5  - - -  TCO2 22 - 32 mmol/L 30  31  31  24  24  24  25  26  28  27  25  29  26  28  27  29  28  29  27  26   - -  Acid-base deficit 0.0 - 2.0 mmol/L - 1.0  2.0  1.0  1.0  - - -  O2 Saturation % 99.0  57.0  56.0  96.0  94.0  94.0  93.0  100.0  100.0  100.0  84.0  100.0  - - -  Details       Multiple values from one day are sorted in reverse-chronological order         Capillary Blood Glucose: Lab Results  Component Value Date   GLUCAP 145 (H) 07/05/2024   GLUCAP 167 (H) 06/14/2024   GLUCAP 121 (H) 06/14/2024   GLUCAP 105 (H) 06/14/2024   GLUCAP 296 (H) 06/10/2024     Exercise Target Goals: Exercise Program Goal: Individual exercise prescription set using results from initial 6 min walk test and THRR while considering  patients activity barriers and safety.   Exercise Prescription Goal: Initial exercise prescription builds to 30-45 minutes a day of aerobic activity, 2-3 days per week.  Home exercise guidelines will be given to patient during program as part of exercise prescription that the participant will acknowledge.  Activity Barriers & Risk Stratification:  Activity Barriers & Cardiac Risk Stratification - 06/10/24 1419       Activity Barriers & Cardiac Risk Stratification   Activity Barriers Left Knee Replacement;Right Knee Replacement;Deconditioning;Balance Concerns;Assistive Device;Back Problems;Decreased Ventricular Function;Other (comment)    Comments Neuropathy in the feet     Cardiac Risk Stratification High          6 Minute Walk:  6 Minute Walk     Row Name 06/10/24 1245         6 Minute Walk   Phase Initial  Pt used rollator     Distance 1080 feet     Walk Time 6 minutes     # of Rest Breaks 0     MPH 2.05     METS 1.73     RPE 9     Perceived Dyspnea  0     VO2 Peak 6.05     Symptoms Yes (comment)     Comments SOB, RPD = 1, resolved with rest     Resting HR 72 bpm     Resting BP 120/72     Resting Oxygen Saturation  99 %     Exercise Oxygen Saturation  during 6 min walk 100 %     Max Ex. HR 84 bpm     Max Ex. BP 132/76     2 Minute Post BP 116/70        Oxygen Initial Assessment:   Oxygen Re-Evaluation:   Oxygen Discharge (Final Oxygen Re-Evaluation):   Initial Exercise Prescription:  Initial Exercise Prescription - 06/10/24 1400       Date of Initial Exercise RX and Referring Provider   Date 06/10/24    Referring Provider Dr. Edda Holland, MD covering)    Expected Discharge Date 09/01/24      NuStep   Level 1    SPM 75    Minutes 25      Prescription Details   Frequency (times per week) 3    Duration Progress to 30 minutes of continuous aerobic without signs/symptoms of physical distress      Intensity   THRR 40-80% of Max Heartrate 57-114    Ratings of Perceived Exertion 11-13    Perceived Dyspnea 0-4      Progression   Progression Continue progressive overload as per policy without signs/symptoms or physical distress.      Resistance Training   Training Prescription Yes    Weight 3 lbs    Reps 10-15          Perform Capillary Blood Glucose checks as needed.  Exercise Prescription Changes:   Exercise Prescription Changes     Row  Name 06/14/24 1034 06/21/24 1036 07/05/24 1026 07/19/24 1027 07/23/24 1011     Response to Exercise   Blood Pressure (Admit) 132/64 124/72 130/70 112/72 136/72   Blood Pressure (Exercise) 114/68 128/72 -- -- --   Blood Pressure (Exit) 124/72 126/72 110/74 118/72  100/56   Heart Rate (Admit) 66 bpm 65 bpm 75 bpm 57 bpm 71 bpm   Heart Rate (Exercise) 91 bpm 84 bpm 97 bpm 85 bpm 92 bpm   Heart Rate (Exit) 73 bpm 74 bpm 84 bpm 65 bpm 70 bpm   Rating of Perceived Exertion (Exercise) 11 11 14 12 12    Symptoms None None None None None   Comments Off to a good start with exercise. -- -- Increased duration to 30 minutes today. Increased workload to level 5 on recumbent stepper. --   Duration Progress to 30 minutes of  aerobic without signs/symptoms of physical distress Progress to 30 minutes of  aerobic without signs/symptoms of physical distress Progress to 30 minutes of  aerobic without signs/symptoms of physical distress Continue with 30 min of aerobic exercise without signs/symptoms of physical distress. Continue with 30 min of aerobic exercise without signs/symptoms of physical distress.   Intensity THRR unchanged THRR unchanged THRR unchanged THRR unchanged THRR unchanged     Progression   Progression Continue to progress workloads to maintain intensity without signs/symptoms of physical distress. Continue to progress workloads to maintain intensity without signs/symptoms of physical distress. Continue to progress workloads to maintain intensity without signs/symptoms of physical distress. Continue to progress workloads to maintain intensity without signs/symptoms of physical distress. Continue to progress workloads to maintain intensity without signs/symptoms of physical distress.   Average METs 1.6 2 1.8 2.4 2.3     Resistance Training   Training Prescription Yes -- Yes Yes Yes   Weight 3 lbs 3 lbs 3 lbs 3 lbs 3 lbs   Reps 10-15 10-15 10-15 10-15 10-15   Time 5 Minutes 5 Minutes 5 Minutes 5 Minutes 5 Minutes     Interval Training   Interval Training No No No No No     NuStep   Level 1 1 4 5 6    SPM 60 77 60 78 56   Minutes 25 25 25 30 30    METs 1.6 2 1.8 2.4 2.3     Home Exercise Plan   Plans to continue exercise at -- -- -- Home (comment)   Walking, seated elliptical at home. Home (comment)  Walking, seated elliptical at home.   Frequency -- -- -- Add 2 additional days to program exercise sessions. Add 2 additional days to program exercise sessions.   Initial Home Exercises Provided -- -- -- 07/16/24 07/16/24      Exercise Comments:   Exercise Comments     Row Name 06/14/24 1411 07/16/24 1102         Exercise Comments Wilber tolerated low intensity exercise well without symptoms. Oriented him to the exercise equipment and stretching routine.Stretching handout given. Reviewed home exercise guidelines and goals with Nancyann.         Exercise Goals and Review:   Exercise Goals     Row Name 06/10/24 1435             Exercise Goals   Increase Physical Activity Yes       Intervention Provide advice, education, support and counseling about physical activity/exercise needs.;Develop an individualized exercise prescription for aerobic and resistive training based on initial evaluation findings, risk stratification, comorbidities and participant's  personal goals.       Expected Outcomes Short Term: Attend rehab on a regular basis to increase amount of physical activity.;Long Term: Exercising regularly at least 3-5 days a week.;Long Term: Add in home exercise to make exercise part of routine and to increase amount of physical activity.       Increase Strength and Stamina Yes       Intervention Provide advice, education, support and counseling about physical activity/exercise needs.;Develop an individualized exercise prescription for aerobic and resistive training based on initial evaluation findings, risk stratification, comorbidities and participant's personal goals.       Expected Outcomes Short Term: Increase workloads from initial exercise prescription for resistance, speed, and METs.;Short Term: Perform resistance training exercises routinely during rehab and add in resistance training at home;Long Term: Improve  cardiorespiratory fitness, muscular endurance and strength as measured by increased METs and functional capacity ( )       Able to understand and use rate of perceived exertion (RPE) scale Yes       Intervention Provide education and explanation on how to use RPE scale       Expected Outcomes Short Term: Able to use RPE daily in rehab to express subjective intensity level;Long Term:  Able to use RPE to guide intensity level when exercising independently       Knowledge and understanding of Target Heart Rate Range (THRR) Yes       Intervention Provide education and explanation of THRR including how the numbers were predicted and where they are located for reference       Expected Outcomes Short Term: Able to state/look up THRR;Long Term: Able to use THRR to govern intensity when exercising independently;Short Term: Able to use daily as guideline for intensity in rehab       Understanding of Exercise Prescription Yes       Intervention Provide education, explanation, and written materials on patient's individual exercise prescription       Expected Outcomes Short Term: Able to explain program exercise prescription;Long Term: Able to explain home exercise prescription to exercise independently          Exercise Goals Re-Evaluation :  Exercise Goals Re-Evaluation     Row Name 06/14/24 1411 07/16/24 1102 07/19/24 1134         Exercise Goal Re-Evaluation   Exercise Goals Review Increase Physical Activity;Increase Strength and Stamina;Able to understand and use rate of perceived exertion (RPE) scale Increase Physical Activity;Increase Strength and Stamina;Able to understand and use rate of perceived exertion (RPE) scale;Understanding of Exercise Prescription Increase Physical Activity;Increase Strength and Stamina;Able to understand and use rate of perceived exertion (RPE) scale;Understanding of Exercise Prescription     Comments Kayl is able to understand and use RPE scale appropriately.  Reviewed exercise prescription with Nancyann. He is doing some walking and has a seated elliptical machine at home. Discussed increasing exercise duration at cardiac rehab, and he will increase from 25 minutes on the recumbent stepper to 30 minutes next session. Marshun increased duration on the recumbent stepper from 25 minutes to 30 minutes and tolerated well.     Expected Outcomes Progress duration to 30 minutes. Increase workloads as tolerated to help increase strength and stamina. Conitnue walking and using seated elliptical machine at home to achieve 150 minutes of aerobic exercise per week. Continue aerobic exercise duration at 30 minutes.        Discharge Exercise Prescription (Final Exercise Prescription Changes):  Exercise Prescription Changes - 07/23/24 1011  Response to Exercise   Blood Pressure (Admit) 136/72    Blood Pressure (Exit) 100/56    Heart Rate (Admit) 71 bpm    Heart Rate (Exercise) 92 bpm    Heart Rate (Exit) 70 bpm    Rating of Perceived Exertion (Exercise) 12    Symptoms None    Duration Continue with 30 min of aerobic exercise without signs/symptoms of physical distress.    Intensity THRR unchanged      Progression   Progression Continue to progress workloads to maintain intensity without signs/symptoms of physical distress.    Average METs 2.3      Resistance Training   Training Prescription Yes    Weight 3 lbs    Reps 10-15    Time 5 Minutes      Interval Training   Interval Training No      NuStep   Level 6    SPM 56    Minutes 30    METs 2.3      Home Exercise Plan   Plans to continue exercise at Home (comment)   Walking, seated elliptical at home.   Frequency Add 2 additional days to program exercise sessions.    Initial Home Exercises Provided 07/16/24          Nutrition:  Target Goals: Understanding of nutrition guidelines, daily intake of sodium 1500mg , cholesterol 200mg , calories 30% from fat and 7% or less from saturated fats,  daily to have 5 or more servings of fruits and vegetables.  Biometrics:  Pre Biometrics - 06/10/24 1145       Pre Biometrics   Waist Circumference 45 inches    Hip Circumference 44 inches    Waist to Hip Ratio 1.02 %    Triceps Skinfold 17 mm    % Body Fat 32.9 %    Grip Strength 16 kg    Flexibility --   pt was uncomfortable geting on table, not performed   Single Leg Stand 2.3 seconds           Nutrition Therapy Plan and Nutrition Goals:  Nutrition Therapy & Goals - 06/14/24 1248       Nutrition Therapy   Diet Regular diet    Drug/Food Interactions Statins/Certain Fruits      Personal Nutrition Goals   Nutrition Goal Patient to identify strategies for reducing cardiovascular risk by attending the [employer] education and nutrition series weekly.    Personal Goal #2 Patient to improve diet quality by using the plate method as a guide for meal planning to include lean protein/plant protein, fruits, vegetables, whole grains, nonfat dairy as part of a well-balanced diet.    Comments Patient with medical history significant for CHF, HTN, hyperlipidemia, DM2. Pt reports diet consists largely of fried, high fat foods; verbalizes lack of desire to make healthy diet changes. Reports increasing early satiety since recent COVID infection. Typically consumes 2 small meals daily. RD encouraged pt to add 2-3 small snacks to help ensure adequate caloric intake; discussed using snacks as opportunity to increase intake of plant-based foods such as fruit and veggies. Encouraged pt to focus on ways to improve diet by adding nutrient-rich foods such as fruit/veggies and whole grains vs removing foods. Patient will benefit from participation in intensive cardiac rehab for nutrition education, exercise, and lifestyle modification.      Intervention Plan   Intervention Prescribe, educate and counsel regarding individualized specific dietary modifications aiming towards targeted core components such as  weight, hypertension, lipid management, diabetes,  heart failure and other comorbidities.;Nutrition handout(s) given to patient.   Handouts: Snack Ideas, My Plate Planner   Expected Outcomes Short Term Goal: Understand basic principles of dietary content, such as calories, fat, sodium, cholesterol and nutrients.;Long Term Goal: Adherence to prescribed nutrition plan.          Nutrition Assessments:  MEDIFICTS Score Key: >=70 Need to make dietary changes  40-70 Heart Healthy Diet <= 40 Therapeutic Level Cholesterol Diet   Flowsheet Row INTENSIVE CARDIAC REHAB from 06/16/2024 in [facility] for Heart, Vascular, & Lung Health  Picture Your Plate Total Score on Admission 54   Picture Your Plate Scores: <59 Unhealthy dietary pattern with much room for improvement. 41-50 Dietary pattern unlikely to meet recommendations for good health and room for improvement. 51-60 More healthful dietary pattern, with some room for improvement.  >60 Healthy dietary pattern, although there may be some specific behaviors that could be improved.    Nutrition Goals Re-Evaluation:  Nutrition Goals Re-Evaluation     Row Name 07/12/24 1104             Goals   Current Weight 219 lb 4.8 oz (99.5 kg)       Nutrition Goal Patient to identify strategies for reducing cardiovascular risk by attending the [facility] education and nutrition series weekly.       Comment 1.7 lb (<1%) wt gain over past month       Expected Outcome Pre-contemplative. Patient with medical history significant for CHF, HTN, hyperlipidemia, DM2. Wt remains relatively stable. Pt verbalizes continued satisfaction with current diet with includes fried foods, red meat and limited high fiber foods such as whole grains, fruits/vegetables. Continues to consume ~ 2 meals daily. RD discussed importance of increasing intake of plant-based foods as well as opting for lean sources of protein. Patient will benefit from participation  in intensive cardiac rehab for nutrition education, exercise, and lifestyle modification.         Personal Goal #2 Re-Evaluation   Personal Goal #2 Patient to improve diet quality by using the plate method as a guide for meal planning to include lean protein/plant protein, fruits, vegetables, whole grains, nonfat dairy as part of a well-balanced diet.          Nutrition Goals Re-Evaluation:  Nutrition Goals Re-Evaluation     Row Name 07/12/24 1104             Goals   Current Weight 219 lb 4.8 oz (99.5 kg)       Nutrition Goal Patient to identify strategies for reducing cardiovascular risk by attending the [facility] education and nutrition series weekly.       Comment 1.7 lb (<1%) wt gain over past month       Expected Outcome Pre-contemplative. Patient with medical history significant for CHF, HTN, hyperlipidemia, DM2. Wt remains relatively stable. Pt verbalizes continued satisfaction with current diet with includes fried foods, red meat and limited high fiber foods such as whole grains, fruits/vegetables. Continues to consume ~ 2 meals daily. RD discussed importance of increasing intake of plant-based foods as well as opting for lean sources of protein. Patient will benefit from participation in intensive cardiac rehab for nutrition education, exercise, and lifestyle modification.         Personal Goal #2 Re-Evaluation   Personal Goal #2 Patient to improve diet quality by using the plate method as a guide for meal planning to include lean protein/plant protein, fruits, vegetables, whole grains, nonfat dairy as  part of a well-balanced diet.          Nutrition Goals Discharge (Final Nutrition Goals Re-Evaluation):  Nutrition Goals Re-Evaluation - 07/12/24 1104       Goals   Current Weight 219 lb 4.8 oz (99.5 kg)    Nutrition Goal Patient to identify strategies for reducing cardiovascular risk by attending the [facility] education and nutrition series weekly.    Comment 1.7 lb (<1%) wt  gain over past month    Expected Outcome Pre-contemplative. Patient with medical history significant for CHF, HTN, hyperlipidemia, DM2. Wt remains relatively stable. Pt verbalizes continued satisfaction with current diet with includes fried foods, red meat and limited high fiber foods such as whole grains, fruits/vegetables. Continues to consume ~ 2 meals daily. RD discussed importance of increasing intake of plant-based foods as well as opting for lean sources of protein. Patient will benefit from participation in intensive cardiac rehab for nutrition education, exercise, and lifestyle modification.      Personal Goal #2 Re-Evaluation   Personal Goal #2 Patient to improve diet quality by using the plate method as a guide for meal planning to include lean protein/plant protein, fruits, vegetables, whole grains, nonfat dairy as part of a well-balanced diet.          Psychosocial: Target Goals: Acknowledge presence or absence of significant depression and/or stress, maximize coping skills, provide positive support system. Participant is able to verbalize types and ability to use techniques and skills needed for reducing stress and depression.  Initial Review & Psychosocial Screening:  Initial Psych Review & Screening - 06/10/24 1412       Initial Review   Current issues with Current Sleep Concerns      Family Dynamics   Good Support System? --   Pt's wife with him today     Barriers   Psychosocial barriers to participate in program There are no identifiable barriers or psychosocial needs.      Screening Interventions   Interventions Encouraged to exercise    Expected Outcomes Short Term goal: Identification and review with participant of any Quality of Life or Depression concerns found by scoring the questionnaire.;Long Term goal: The participant improves quality of Life and PHQ9 Scores as seen by post scores and/or verbalization of changes          Quality of Life Scores:  Quality  of Life - 06/10/24 1428       Quality of Life   Select Quality of Life      Quality of Life Scores   Health/Function Pre 16.67 %    Socioeconomic Pre 21.67 %    Psych/Spiritual Pre 17.21 %    Family Pre 27.6 %    GLOBAL Pre 19.67 %         Scores of 19 and below usually indicate a poorer quality of life in these areas.  A difference of  2-3 points is a clinically meaningful difference.  A difference of 2-3 points in the total score of the Quality of Life Index has been associated with significant improvement in overall quality of life, self-image, physical symptoms, and general health in studies assessing change in quality of life.  PHQ-9: Review Flowsheet       06/10/2024 02/28/2021 01/22/2021 04/20/2013  Depression screen PHQ 2/9  Decreased Interest 0 0 0 0  Down, Depressed, Hopeless 0 0 0 0  PHQ - 2 Score 0 0 0 0  Altered sleeping 3 - 0 -  Tired, decreased energy 3 -  1 -  Change in appetite 0 - 0 -  Feeling bad or failure about yourself  0 - 0 -  Trouble concentrating 0 - 0 -  Moving slowly or fidgety/restless 0 - 0 -  Suicidal thoughts 0 - 0 -  PHQ-9 Score 6 - 1  -  Difficult doing work/chores Not difficult at all - Not difficult at all -    Details       Data saved with a previous flowsheet row definition        Interpretation of Total Score  Total Score Depression Severity:  1-4 = Minimal depression, 5-9 = Mild depression, 10-14 = Moderate depression, 15-19 = Moderately severe depression, 20-27 = Severe depression   Psychosocial Evaluation and Intervention:   Psychosocial Re-Evaluation:  Psychosocial Re-Evaluation     Row Name 07/06/24 1254 08/03/24 1118           Psychosocial Re-Evaluation   Current issues with Current Sleep Concerns Current Sleep Concerns      Comments Discussed PHQ9 with the patient. Todd says his sleep and energy levels are about thre same. Todd says that he has trouble with his left knee at times, Todd has not voiced any increased  concerns or stressors during exercise at cardiac rehab.      Interventions Stress management education;Relaxation education;Encouraged to attend Cardiac Rehabilitation for the exercise Stress management education;Relaxation education;Encouraged to attend Cardiac Rehabilitation for the exercise      Continue Psychosocial Services  Follow up required by staff Follow up required by staff         Psychosocial Discharge (Final Psychosocial Re-Evaluation):  Psychosocial Re-Evaluation - 08/03/24 1118       Psychosocial Re-Evaluation   Current issues with Current Sleep Concerns    Comments Todd has not voiced any increased concerns or stressors during exercise at cardiac rehab.    Interventions Stress management education;Relaxation education;Encouraged to attend Cardiac Rehabilitation for the exercise    Continue Psychosocial Services  Follow up required by staff          Vocational Rehabilitation: Provide vocational rehab assistance to qualifying candidates.   Vocational Rehab Evaluation & Intervention:  Vocational Rehab - 06/10/24 1416       Initial Vocational Rehab Evaluation & Intervention   Assessment shows need for Vocational Rehabilitation --   Pt is retired         Education: Education Goals: Education classes will be provided on a weekly basis, covering required topics. Participant will state understanding/return demonstration of topics presented.    Education     Row Name 06/14/24 1100     Education   Cardiac Education Topics Pritikin   Psychologist, Forensic Exercise Education   Exercise Education Biomechanial Limitations   Instruction Review Code 1- Verbalizes Understanding   Class Start Time 1150   Class Stop Time 1224   Class Time Calculation (min) 34 min    Row Name 06/16/24 1100     Education   Cardiac Education Topics Pritikin   Orthoptist   Educator Dietitian    Weekly Topic Fast Evening Meals   Instruction Review Code 1- Verbalizes Understanding   Class Start Time 1148   Class Stop Time 1232   Class Time Calculation (min) 44 min    Row Name 06/18/24 1100     Education   Cardiac Education Topics Pritikin  Select Core Videos     Core Videos   Educator Dietitian   Select Nutrition   Nutrition Vitamins and Minerals   Instruction Review Code 1- Verbalizes Understanding   Class Start Time 1148   Class Stop Time 1224   Class Time Calculation (min) 36 min    Row Name 06/21/24 1100     Education   Cardiac Education Topics Pritikin   Select Core Videos     Core Videos   Educator Exercise Physiologist   Select Exercise Education   Exercise Education Improving Performance   Instruction Review Code 1- Verbalizes Understanding   Class Start Time 1150   Class Stop Time 1225   Class Time Calculation (min) 35 min      Core Videos: Exercise    Move It!  Clinical staff conducted group or individual video education with verbal and written material and guidebook.  Patient learns the recommended Pritikin exercise program. Exercise with the goal of living a long, healthy life. Some of the health benefits of exercise include controlled diabetes, healthier blood pressure levels, improved cholesterol levels, improved heart and lung capacity, improved sleep, and better body composition. Everyone should speak with their doctor before starting or changing an exercise routine.  Biomechanical Limitations Clinical staff conducted group or individual video education with verbal and written material and guidebook.  Patient learns how biomechanical limitations can impact exercise and how we can mitigate and possibly overcome limitations to have an impactful and balanced exercise routine.  Body Composition Clinical staff conducted group or individual video education with verbal and written material and guidebook.  Patient learns that body composition (ratio  of muscle mass to fat mass) is a key component to assessing overall fitness, rather than body weight alone. Increased fat mass, especially visceral belly fat, can put us  at increased risk for metabolic syndrome, type 2 diabetes, heart disease, and even death. It is recommended to combine diet and exercise (cardiovascular and resistance training) to improve your body composition. Seek guidance from your physician and exercise physiologist before implementing an exercise routine.  Exercise Action Plan Clinical staff conducted group or individual video education with verbal and written material and guidebook.  Patient learns the recommended strategies to achieve and enjoy long-term exercise adherence, including variety, self-motivation, self-efficacy, and positive decision making. Benefits of exercise include fitness, good health, weight management, more energy, better sleep, less stress, and overall well-being.  Medical   Heart Disease Risk Reduction Clinical staff conducted group or individual video education with verbal and written material and guidebook.  Patient learns our heart is our most vital organ as it circulates oxygen, nutrients, white blood cells, and hormones throughout the entire body, and carries waste away. Data supports a plant-based eating plan like the Pritikin Program for its effectiveness in slowing progression of and reversing heart disease. The video provides a number of recommendations to address heart disease.   Metabolic Syndrome and Belly Fat  Clinical staff conducted group or individual video education with verbal and written material and guidebook.  Patient learns what metabolic syndrome is, how it leads to heart disease, and how one can reverse it and keep it from coming back. You have metabolic syndrome if you have 3 of the following 5 criteria: abdominal obesity, high blood pressure, high triglycerides, low HDL cholesterol, and high blood sugar.  Hypertension and Heart  Disease Clinical staff conducted group or individual video education with verbal and written material and guidebook.  Patient learns that high blood pressure, or  hypertension, is very common in the [place] . Hypertension is largely due to excessive salt intake, but other important risk factors include being overweight, physical inactivity, drinking too much alcohol, smoking, and not eating enough potassium from fruits and vegetables. High blood pressure is a leading risk factor for heart attack, stroke, congestive heart failure, dementia, kidney failure, and premature death. Long-term effects of excessive salt intake include stiffening of the arteries and thickening of heart muscle and organ damage. Recommendations include ways to reduce hypertension and the risk of heart disease.  Diseases of Our Time - Focusing on Diabetes Clinical staff conducted group or individual video education with verbal and written material and guidebook.  Patient learns why the best way to stop diseases of our time is prevention, through food and other lifestyle changes. Medicine (such as prescription pills and surgeries) is often only a Band-Aid on the problem, not a long-term solution. Most common diseases of our time include obesity, type 2 diabetes, hypertension, heart disease, and cancer. The Pritikin Program is recommended and has been proven to help reduce, reverse, and/or prevent the damaging effects of metabolic syndrome.  Nutrition   Overview of the Pritikin Eating Plan  Clinical staff conducted group or individual video education with verbal and written material and guidebook.  Patient learns about the Pritikin Eating Plan for disease risk reduction. The Pritikin Eating Plan emphasizes a wide variety of unrefined, minimally-processed carbohydrates, like fruits, vegetables, whole grains, and legumes. Go, Caution, and Stop food choices are explained. Plant-based and lean animal proteins are emphasized. Rationale  provided for low sodium intake for blood pressure control, low added sugars for blood sugar stabilization, and low added fats and oils for coronary artery disease risk reduction and weight management.  Calorie Density  Clinical staff conducted group or individual video education with verbal and written material and guidebook.  Patient learns about calorie density and how it impacts the Pritikin Eating Plan. Knowing the characteristics of the food you choose will help you decide whether those foods will lead to weight gain or weight loss, and whether you want to consume more or less of them. Weight loss is usually a side effect of the Pritikin Eating Plan because of its focus on low calorie-dense foods.  Label Reading  Clinical staff conducted group or individual video education with verbal and written material and guidebook.  Patient learns about the Pritikin recommended label reading guidelines and corresponding recommendations regarding calorie density, added sugars, sodium content, and whole grains.  Dining Out - Part 1  Clinical staff conducted group or individual video education with verbal and written material and guidebook.  Patient learns that restaurant meals can be sabotaging because they can be so high in calories, fat, sodium, and/or sugar. Patient learns recommended strategies on how to positively address this and avoid unhealthy pitfalls.  Facts on Fats  Clinical staff conducted group or individual video education with verbal and written material and guidebook.  Patient learns that lifestyle modifications can be just as effective, if not more so, as many medications for lowering your risk of heart disease. A Pritikin lifestyle can help to reduce your risk of inflammation and atherosclerosis (cholesterol build-up, or plaque, in the artery walls). Lifestyle interventions such as dietary choices and physical activity address the cause of atherosclerosis. A review of the types of fats and  their impact on blood cholesterol levels, along with dietary recommendations to reduce fat intake is also included.  Nutrition Action Plan  Clinical staff conducted group or  individual video education with verbal and written material and guidebook.  Patient learns how to incorporate Pritikin recommendations into their lifestyle. Recommendations include planning and keeping personal health goals in mind as an important part of their success.  Healthy Mind-Set    Healthy Minds, Bodies, Hearts  Clinical staff conducted group or individual video education with verbal and written material and guidebook.  Patient learns how to identify when they are stressed. Video will discuss the impact of that stress, as well as the many benefits of stress management. Patient will also be introduced to stress management techniques. The way we think, act, and feel has an impact on our hearts.  How Our Thoughts Can Heal Our Hearts  Clinical staff conducted group or individual video education with verbal and written material and guidebook.  Patient learns that negative thoughts can cause depression and anxiety. This can result in negative lifestyle behavior and serious health problems. Cognitive behavioral therapy is an effective method to help control our thoughts in order to change and improve our emotional outlook.  Additional Videos:  Exercise    Improving Performance  Clinical staff conducted group or individual video education with verbal and written material and guidebook.  Patient learns to use a non-linear approach by alternating intensity levels and lengths of time spent exercising to help burn more calories and lose more body fat. Cardiovascular exercise helps improve heart health, metabolism, hormonal balance, blood sugar control, and recovery from fatigue. Resistance training improves strength, endurance, balance, coordination, reaction time, metabolism, and muscle mass. Flexibility exercise improves  circulation, posture, and balance. Seek guidance from your physician and exercise physiologist before implementing an exercise routine and learn your capabilities and proper form for all exercise.  Introduction to Yoga  Clinical staff conducted group or individual video education with verbal and written material and guidebook.  Patient learns about yoga, a discipline of the coming together of mind, breath, and body. The benefits of yoga include improved flexibility, improved range of motion, better posture and core strength, increased lung function, weight loss, and positive self-image. Yogas heart health benefits include lowered blood pressure, healthier heart rate, decreased cholesterol and triglyceride levels, improved immune function, and reduced stress. Seek guidance from your physician and exercise physiologist before implementing an exercise routine and learn your capabilities and proper form for all exercise.  Medical   Aging: Enhancing Your Quality of Life  Clinical staff conducted group or individual video education with verbal and written material and guidebook.  Patient learns key strategies and recommendations to stay in good physical health and enhance quality of life, such as prevention strategies, having an advocate, securing a Health Care Proxy and Power of Attorney, and keeping a list of medications and system for tracking them. It also discusses how to avoid risk for bone loss.  Biology of Weight Control  Clinical staff conducted group or individual video education with verbal and written material and guidebook.  Patient learns that weight gain occurs because we consume more calories than we burn (eating more, moving less). Even if your body weight is normal, you may have higher ratios of fat compared to muscle mass. Too much body fat puts you at increased risk for cardiovascular disease, heart attack, stroke, type 2 diabetes, and obesity-related cancers. In addition to exercise,  following the Pritikin Eating Plan can help reduce your risk.  Decoding Lab Results  Clinical staff conducted group or individual video education with verbal and written material and guidebook.  Patient learns that lab test  reflects one measurement whose values change over time and are influenced by many factors, including medication, stress, sleep, exercise, food, hydration, pre-existing medical conditions, and more. It is recommended to use the knowledge from this video to become more involved with your lab results and evaluate your numbers to speak with your doctor.   Diseases of Our Time - Overview  Clinical staff conducted group or individual video education with verbal and written material and guidebook.  Patient learns that according to the CDC, 50% to 70% of chronic diseases (such as obesity, type 2 diabetes, elevated lipids, hypertension, and heart disease) are avoidable through lifestyle improvements including healthier food choices, listening to satiety cues, and increased physical activity.  Sleep Disorders Clinical staff conducted group or individual video education with verbal and written material and guidebook.  Patient learns how good quality and duration of sleep are important to overall health and well-being. Patient also learns about sleep disorders and how they impact health along with recommendations to address them, including discussing with a physician.  Nutrition  Dining Out - Part 2 Clinical staff conducted group or individual video education with verbal and written material and guidebook.  Patient learns how to plan ahead and communicate in order to maximize their dining experience in a healthy and nutritious manner. Included are recommended food choices based on the type of restaurant the patient is visiting.   Fueling a Banker conducted group or individual video education with verbal and written material and guidebook.  There is a strong  connection between our food choices and our health. Diseases like obesity and type 2 diabetes are very prevalent and are in large-part due to lifestyle choices. The Pritikin Eating Plan provides plenty of food and hunger-curbing satisfaction. It is easy to follow, affordable, and helps reduce health risks.  Menu Workshop  Clinical staff conducted group or individual video education with verbal and written material and guidebook.  Patient learns that restaurant meals can sabotage health goals because they are often packed with calories, fat, sodium, and sugar. Recommendations include strategies to plan ahead and to communicate with the manager, chef, or server to help order a healthier meal.  Planning Your Eating Strategy  Clinical staff conducted group or individual video education with verbal and written material and guidebook.  Patient learns about the Pritikin Eating Plan and its benefit of reducing the risk of disease. The Pritikin Eating Plan does not focus on calories. Instead, it emphasizes high-quality, nutrient-rich foods. By knowing the characteristics of the foods, we choose, we can determine their calorie density and make informed decisions.  Targeting Your Nutrition Priorities  Clinical staff conducted group or individual video education with verbal and written material and guidebook.  Patient learns that lifestyle habits have a tremendous impact on disease risk and progression. This video provides eating and physical activity recommendations based on your personal health goals, such as reducing LDL cholesterol, losing weight, preventing or controlling type 2 diabetes, and reducing high blood pressure.  Vitamins and Minerals  Clinical staff conducted group or individual video education with verbal and written material and guidebook.  Patient learns different ways to obtain key vitamins and minerals, including through a recommended healthy diet. It is important to discuss all supplements  you take with your doctor.   Healthy Mind-Set    Smoking Cessation  Clinical staff conducted group or individual video education with verbal and written material and guidebook.  Patient learns that cigarette smoking and tobacco addiction pose a  serious health risk which affects millions of people. Stopping smoking will significantly reduce the risk of heart disease, lung disease, and many forms of cancer. Recommended strategies for quitting are covered, including working with your doctor to develop a successful plan.  Culinary   Becoming a Set Designer conducted group or individual video education with verbal and written material and guidebook.  Patient learns that cooking at home can be healthy, cost-effective, quick, and puts them in control. Keys to cooking healthy recipes will include looking at your recipe, assessing your equipment needs, planning ahead, making it simple, choosing cost-effective seasonal ingredients, and limiting the use of added fats, salts, and sugars.  Cooking - Breakfast and Snacks  Clinical staff conducted group or individual video education with verbal and written material and guidebook.  Patient learns how important breakfast is to satiety and nutrition through the entire day. Recommendations include key foods to eat during breakfast to help stabilize blood sugar levels and to prevent overeating at meals later in the day. Planning ahead is also a key component.  Cooking - Educational Psychologist conducted group or individual video education with verbal and written material and guidebook.  Patient learns eating strategies to improve overall health, including an approach to cook more at home. Recommendations include thinking of animal protein as a side on your plate rather than center stage and focusing instead on lower calorie dense options like vegetables, fruits, whole grains, and plant-based proteins, such as beans. Making sauces in large  quantities to freeze for later and leaving the skin on your vegetables are also recommended to maximize your experience.  Cooking - Healthy Salads and Dressing Clinical staff conducted group or individual video education with verbal and written material and guidebook.  Patient learns that vegetables, fruits, whole grains, and legumes are the foundations of the Pritikin Eating Plan. Recommendations include how to incorporate each of these in flavorful and healthy salads, and how to create homemade salad dressings. Proper handling of ingredients is also covered. Cooking - Soups and [employer] - Soups and Desserts Clinical staff conducted group or individual video education with verbal and written material and guidebook.  Patient learns that Pritikin soups and desserts make for easy, nutritious, and delicious snacks and meal components that are low in sodium, fat, sugar, and calorie density, while high in vitamins, minerals, and filling fiber. Recommendations include simple and healthy ideas for soups and desserts.   Overview     The Pritikin Solution Program Overview Clinical staff conducted group or individual video education with verbal and written material and guidebook.  Patient learns that the results of the Pritikin Program have been documented in more than 100 articles published in peer-reviewed journals, and the benefits include reducing risk factors for (and, in some cases, even reversing) high cholesterol, high blood pressure, type 2 diabetes, obesity, and more! An overview of the three key pillars of the Pritikin Program will be covered: eating well, doing regular exercise, and having a healthy mind-set.  WORKSHOPS  Exercise: Exercise Basics: Building Your Action Plan Clinical staff led group instruction and group discussion with PowerPoint presentation and patient guidebook. To enhance the learning environment the use of posters, models and videos may be added. At the conclusion of  this workshop, patients will comprehend the difference between physical activity and exercise, as well as the benefits of incorporating both, into their routine. Patients will understand the FITT (Frequency, Intensity, Time, and Type) principle and how to  use it to build an exercise action plan. In addition, safety concerns and other considerations for exercise and cardiac rehab will be addressed by the presenter. The purpose of this lesson is to promote a comprehensive and effective weekly exercise routine in order to improve patients overall level of fitness.   Managing Heart Disease: Your Path to a Healthier Heart Clinical staff led group instruction and group discussion with PowerPoint presentation and patient guidebook. To enhance the learning environment the use of posters, models and videos may be added.At the conclusion of this workshop, patients will understand the anatomy and physiology of the heart. Additionally, they will understand how Pritikins three pillars impact the risk factors, the progression, and the management of heart disease.  The purpose of this lesson is to provide a high-level overview of the heart, heart disease, and how the Pritikin lifestyle positively impacts risk factors.  Exercise Biomechanics Clinical staff led group instruction and group discussion with PowerPoint presentation and patient guidebook. To enhance the learning environment the use of posters, models and videos may be added. Patients will learn how the structural parts of their bodies function and how these functions impact their daily activities, movement, and exercise. Patients will learn how to promote a neutral spine, learn how to manage pain, and identify ways to improve their physical movement in order to promote healthy living. The purpose of this lesson is to expose patients to common physical limitations that impact physical activity. Participants will learn practical ways to adapt and  manage aches and pains, and to minimize their effect on regular exercise. Patients will learn how to maintain good posture while sitting, walking, and lifting.  Balance Training and Fall Prevention  Clinical staff led group instruction and group discussion with PowerPoint presentation and patient guidebook. To enhance the learning environment the use of posters, models and videos may be added. At the conclusion of this workshop, patients will understand the importance of their sensorimotor skills (vision, proprioception, and the vestibular system) in maintaining their ability to balance as they age. Patients will apply a variety of balancing exercises that are appropriate for their current level of function. Patients will understand the common causes for poor balance, possible solutions to these problems, and ways to modify their physical environment in order to minimize their fall risk. The purpose of this lesson is to teach patients about the importance of maintaining balance as they age and ways to minimize their risk of falling.  WORKSHOPS   Nutrition:  Fueling a Ship Broker led group instruction and group discussion with PowerPoint presentation and patient guidebook. To enhance the learning environment the use of posters, models and videos may be added. Patients will review the foundational principles of the Pritikin Eating Plan and understand what constitutes a serving size in each of the food groups. Patients will also learn Pritikin-friendly foods that are better choices when away from home and review make-ahead meal and snack options. Calorie density will be reviewed and applied to three nutrition priorities: weight maintenance, weight loss, and weight gain. The purpose of this lesson is to reinforce (in a group setting) the key concepts around what patients are recommended to eat and how to apply these guidelines when away from home by planning and selecting Pritikin-friendly  options. Patients will understand how calorie density may be adjusted for different weight management goals.  Mindful Eating  Clinical staff led group instruction and group discussion with PowerPoint presentation and patient guidebook. To enhance the learning environment the  use of posters, models and videos may be added. Patients will briefly review the concepts of the Pritikin Eating Plan and the importance of low-calorie dense foods. The concept of mindful eating will be introduced as well as the importance of paying attention to internal hunger signals. Triggers for non-hunger eating and techniques for dealing with triggers will be explored. The purpose of this lesson is to provide patients with the opportunity to review the basic principles of the Pritikin Eating Plan, discuss the value of eating mindfully and how to measure internal cues of hunger and fullness using the Hunger Scale. Patients will also discuss reasons for non-hunger eating and learn strategies to use for controlling emotional eating.  Targeting Your Nutrition Priorities Clinical staff led group instruction and group discussion with PowerPoint presentation and patient guidebook. To enhance the learning environment the use of posters, models and videos may be added. Patients will learn how to determine their genetic susceptibility to disease by reviewing their family history. Patients will gain insight into the importance of diet as part of an overall healthy lifestyle in mitigating the impact of genetics and other environmental insults. The purpose of this lesson is to provide patients with the opportunity to assess their personal nutrition priorities by looking at their family history, their own health history and current risk factors. Patients will also be able to discuss ways of prioritizing and modifying the Pritikin Eating Plan for their highest risk areas  Menu  Clinical staff led group instruction and group discussion with  PowerPoint presentation and patient guidebook. To enhance the learning environment the use of posters, models and videos may be added. Using menus brought in from [employer], or printed from [employer], patients will apply the Pritikin dining out guidelines that were presented in the [employer] video. Patients will also be able to practice these guidelines in a variety of provided scenarios. The purpose of this lesson is to provide patients with the opportunity to practice hands-on learning of the Pritikin Dining Out guidelines with actual menus and practice scenarios.  Label Reading Clinical staff led group instruction and group discussion with PowerPoint presentation and patient guidebook. To enhance the learning environment the use of posters, models and videos may be added. Patients will review and discuss the Pritikin label reading guidelines presented in [facility] Label Reading Educational series video. Using fool labels brought in from local grocery stores and markets, patients will apply the label reading guidelines and determine if the packaged food meet the Pritikin guidelines. The purpose of this lesson is to provide patients with the opportunity to review, discuss, and practice hands-on learning of the Pritikin Label Reading guidelines with actual packaged food labels. Cooking School  Pritikins [employer] are designed to teach patients ways to prepare quick, simple, and affordable recipes at home. The importance of nutritions role in chronic disease risk reduction is reflected in its emphasis in the overall Pritikin program. By learning how to prepare essential core Pritikin Eating Plan recipes, patients will increase control over what they eat; be able to customize the flavor of foods without the use of added salt, sugar, or fat; and improve the quality of the food they consume. By learning a set of core recipes which are easily assembled, quickly  prepared, and affordable, patients are more likely to prepare more healthy foods at home. These workshops focus on convenient breakfasts, simple entres, side dishes, and desserts which can be prepared with minimal effort and are consistent with nutrition  recommendations for cardiovascular risk reduction. Cooking [employer] are taught by a armed forces logistics/support/administrative officer (RD) who has been trained by the [employer]. The chef or RD has a clear understanding of the importance of minimizing - if not completely eliminating - added fat, sugar, and sodium in recipes. Throughout the series of Cooking School Workshop sessions, patients will learn about healthy ingredients and efficient methods of cooking to build confidence in their capability to prepare    Cooking School weekly topics:  Adding Flavor- Sodium-Free  Fast and Healthy Breakfasts  Powerhouse Plant-Based Proteins  Satisfying Salads and Dressings  Simple Sides and Sauces  International Cuisine-Spotlight on the [employer] Zones  Delicious Desserts  Savory Soups  [employer] - Meals in a Astronomer Appetizers and Snacks  Comforting Weekend Breakfasts  One-Pot Wonders   Fast Evening Meals  Landscape Architect Your Pritikin Plate  WORKSHOPS   Healthy Mindset (Psychosocial):  Focused Goals, Sustainable Changes Clinical staff led group instruction and group discussion with PowerPoint presentation and patient guidebook. To enhance the learning environment the use of posters, models and videos may be added. Patients will be able to apply effective goal setting strategies to establish at least one personal goal, and then take consistent, meaningful action toward that goal. They will learn to identify common barriers to achieving personal goals and develop strategies to overcome them. Patients will also gain an understanding of how our mind-set can impact our ability to achieve goals and the importance of  cultivating a positive and growth-oriented mind-set. The purpose of this lesson is to provide patients with a deeper understanding of how to set and achieve personal goals, as well as the tools and strategies needed to overcome common obstacles which may arise along the way.  From Head to Heart: The Power of a Healthy Outlook  Clinical staff led group instruction and group discussion with PowerPoint presentation and patient guidebook. To enhance the learning environment the use of posters, models and videos may be added. Patients will be able to recognize and describe the impact of emotions and mood on physical health. They will discover the importance of self-care and explore self-care practices which may work for them. Patients will also learn how to utilize the 4 Cs to cultivate a healthier outlook and better manage stress and challenges. The purpose of this lesson is to demonstrate to patients how a healthy outlook is an essential part of maintaining good health, especially as they continue their cardiac rehab journey.  Healthy Sleep for a Healthy Heart Clinical staff led group instruction and group discussion with PowerPoint presentation and patient guidebook. To enhance the learning environment the use of posters, models and videos may be added. At the conclusion of this workshop, patients will be able to demonstrate knowledge of the importance of sleep to overall health, well-being, and quality of life. They will understand the symptoms of, and treatments for, common sleep disorders. Patients will also be able to identify daytime and nighttime behaviors which impact sleep, and they will be able to apply these tools to help manage sleep-related challenges. The purpose of this lesson is to provide patients with a general overview of sleep and outline the importance of quality sleep. Patients will learn about a few of the most common sleep disorders. Patients will also be introduced to the concept of  sleep hygiene, and discover ways to self-manage certain sleeping problems through simple daily behavior changes. Finally, the workshop will motivate patients by  clarifying the links between quality sleep and their goals of heart-healthy living.   Recognizing and Reducing Stress Clinical staff led group instruction and group discussion with PowerPoint presentation and patient guidebook. To enhance the learning environment the use of posters, models and videos may be added. At the conclusion of this workshop, patients will be able to understand the types of stress reactions, differentiate between acute and chronic stress, and recognize the impact that chronic stress has on their health. They will also be able to apply different coping mechanisms, such as reframing negative self-talk. Patients will have the opportunity to practice a variety of stress management techniques, such as deep abdominal breathing, progressive muscle relaxation, and/or guided imagery.  The purpose of this lesson is to educate patients on the role of stress in their lives and to provide healthy techniques for coping with it.  Learning Barriers/Preferences:  Learning Barriers/Preferences - 06/10/24 1415       Learning Barriers/Preferences   Learning Barriers Sight;Hearing   wears glasses, HOH   Learning Preferences Computer/Internet;Audio;Group Instruction;Individual Instruction;Pictoral;Skilled Demonstration;Verbal Instruction;Video;Written Material          Education Topics:  Knowledge Questionnaire Score:  Knowledge Questionnaire Score - 06/10/24 1437       Knowledge Questionnaire Score   Pre Score 26/28          Core Components/Risk Factors/Patient Goals at Admission:  Personal Goals and Risk Factors at Admission - 06/10/24 1416       Core Components/Risk Factors/Patient Goals on Admission    Weight Management Yes;Obesity    Intervention Weight Management/Obesity: Establish reasonable short term and long  term weight goals.;Obesity: Provide education and appropriate resources to help participant work on and attain dietary goals.    Expected Outcomes Short Term: Continue to assess and modify interventions until short term weight is achieved;Long Term: Adherence to nutrition and physical activity/exercise program aimed toward attainment of established weight goal;Weight Loss: Understanding of general recommendations for a balanced deficit meal plan, which promotes 1-2 lb weight loss per week and includes a negative energy balance of [id number] kcal/d;Understanding recommendations for meals to include 15-35% energy as protein, 25-35% energy from fat, 35-60% energy from carbohydrates, less than 200mg  of dietary cholesterol, 20-35 gm of total fiber daily;Understanding of distribution of calorie intake throughout the day with the consumption of 4-5 meals/snacks    Tobacco Cessation Yes    Number of packs per day 3-4 cigarettes per day    Intervention Assist the participant in steps to quit. Provide individualized education and counseling about committing to Tobacco Cessation, relapse prevention, and pharmacological support that can be provided by physician.;Education officer, environmental, assist with locating and accessing local/national Quit Smoking programs, and support quit date choice.    Expected Outcomes Short Term: Will demonstrate readiness to quit, by selecting a quit date.;Long Term: Complete abstinence from all tobacco products for at least 12 months from quit date.;Short Term: Will quit all tobacco product use, adhering to prevention of relapse plan.    Diabetes Yes    Intervention Provide education about signs/symptoms and action to take for hypo/hyperglycemia.;Provide education about proper nutrition, including hydration, and aerobic/resistive exercise prescription along with prescribed medications to achieve blood glucose in normal ranges: Fasting glucose 65-99 mg/dL    Expected Outcomes Short Term:  Participant verbalizes understanding of the signs/symptoms and immediate care of hyper/hypoglycemia, proper foot care and importance of medication, aerobic/resistive exercise and nutrition plan for blood glucose control.;Long Term: Attainment of HbA1C < 7%.    Heart Failure  Yes    Intervention Provide a combined exercise and nutrition program that is supplemented with education, support and counseling about heart failure. Directed toward relieving symptoms such as shortness of breath, decreased exercise tolerance, and extremity edema.    Expected Outcomes Improve functional capacity of life;Short term: Attendance in program 2-3 days a week with increased exercise capacity. Reported lower sodium intake. Reported increased fruit and vegetable intake. Reports medication compliance.;Short term: Daily weights obtained and reported for increase. Utilizing diuretic protocols set by physician.;Long term: Adoption of self-care skills and reduction of barriers for early signs and symptoms recognition and intervention leading to self-care maintenance.    Hypertension Yes    Intervention Provide education on lifestyle modifcations including regular physical activity/exercise, weight management, moderate sodium restriction and increased consumption of fresh fruit, vegetables, and low fat dairy, alcohol moderation, and smoking cessation.;Monitor prescription use compliance.    Expected Outcomes Short Term: Continued assessment and intervention until BP is < 140/64mm HG in hypertensive participants. < 130/56mm HG in hypertensive participants with diabetes, heart failure or chronic kidney disease.;Long Term: Maintenance of blood pressure at goal levels.    Lipids Yes    Intervention Provide education and support for participant on nutrition & aerobic/resistive exercise along with prescribed medications to achieve LDL 70mg , HDL >40mg .    Expected Outcomes Short Term: Participant states understanding of desired cholesterol  values and is compliant with medications prescribed. Participant is following exercise prescription and nutrition guidelines.;Long Term: Cholesterol controlled with medications as prescribed, with individualized exercise RX and with personalized nutrition plan. Value goals: LDL < 70mg , HDL > 40 mg.          Core Components/Risk Factors/Patient Goals Review:   Goals and Risk Factor Review     Row Name 07/06/24 1258 08/03/24 1120           Core Components/Risk Factors/Patient Goals Review   Personal Goals Review Weight Management/Obesity;Tobacco Cessation;Diabetes;Lipids;Hypertension Weight Management/Obesity;Tobacco Cessation;Diabetes;Lipids;Hypertension      Review Todd has been doing fair with exercise for his fitness level. Vital signs and CBG's have been stable. Will encourage smoking cessation. Todd has been doing fair with exercise for his fitness level. Vital signs and CBG's have been stable. Todd says he smokes about 4 cigarettes a day. Todd says he is not interested in quitting smoking at this toime. Don's last day of exercise at cardiac rehab was on 07/23/24. Todd has gained 3.3 kg since starting cardiac rehab.      Expected Outcomes Todd will continue to participate in cardiac rehab for exercise, nutrition and lifestyle modifications Todd will continue to participate in cardiac rehab for exercise, nutrition and lifestyle modifications         Core Components/Risk Factors/Patient Goals at Discharge (Final Review):   Goals and Risk Factor Review - 08/03/24 1120       Core Components/Risk Factors/Patient Goals Review   Personal Goals Review Weight Management/Obesity;Tobacco Cessation;Diabetes;Lipids;Hypertension    Review Todd has been doing fair with exercise for his fitness level. Vital signs and CBG's have been stable. Todd says he smokes about 4 cigarettes a day. Todd says he is not interested in quitting smoking at this toime. Don's last day of exercise at cardiac rehab was on 07/23/24.  Todd has gained 3.3 kg since starting cardiac rehab.    Expected Outcomes Todd will continue to participate in cardiac rehab for exercise, nutrition and lifestyle modifications          ITP Comments:  ITP Comments  Row Name 06/10/24 1038 07/06/24 1252 08/03/24 1118       ITP Comments Wilbert Bihari, MD: Medical Director. Introduction to the [facility] Education Program/Intensive Cardiac Rehab. Initial orientation packet reviewed with patient. 30 Day ITP Review. Todd has good participation in cardiac rehab when in attendance 30 Day ITP Review. Don's last day of attendance at cardiac rehab was on 07/23/24.        Comments: See ITP Comments    [1]  Current Outpatient Medications:    apixaban  (ELIQUIS ) 5 MG TABS tablet, Take 5 mg by mouth 2 (two) times daily., Disp: , Rfl:    aspirin  EC 81 MG EC tablet, Take 1 tablet (81 mg total) by mouth daily. Swallow whole., Disp: 30 tablet, Rfl: 11   atorvastatin  (LIPITOR) 40 MG tablet, Take 40 mg by mouth daily in the afternoon. (Patient taking differently: Take 80 mg by mouth daily in the afternoon.), Disp: , Rfl:    cholecalciferol  (VITAMIN D3) 25 MCG (1000 UNIT) tablet, Take 1,000 Units by mouth daily. (Patient taking differently: Take 2,000 Units by mouth daily.), Disp: , Rfl:    Cyanocobalamin  (VITAMIN B12) 500 MCG TABS, Take 500 mcg by mouth 2 (two) times daily., Disp: , Rfl:    DENTA 5000 PLUS 1.1 % CREA dental cream, Place 1 Application onto teeth at bedtime., Disp: , Rfl:    ezetimibe (ZETIA) 10 MG tablet, Take 10 mg by mouth daily., Disp: , Rfl:    insulin  glargine (LANTUS ) 100 UNIT/ML injection, Inject 12 Units into the skin daily., Disp: , Rfl:    levothyroxine  (SYNTHROID ) 200 MCG tablet, Take 1 tablet (200 mcg total) by mouth daily., Disp: 30 tablet, Rfl: 0   metFORMIN  (GLUCOPHAGE ) 500 MG tablet, Take 1,000 mg by mouth 2 (two) times daily with a meal. (Patient not taking: Reported on 06/10/2024), Disp: , Rfl:    metoprolol  succinate  (TOPROL -XL) 100 MG 24 hr tablet, Take 1 tablet (100 mg total) by mouth daily. Take with or immediately following a meal. (Patient taking differently: Take 200 mg by mouth daily. Take with or immediately following a meal.), Disp: 30 tablet, Rfl: 11   Multiple Vitamins-Minerals (CENTRUM SILVER 50+MEN) TABS, Take 1 tablet by mouth daily with breakfast., Disp: , Rfl:    Multiple Vitamins-Minerals (PRESERVISION AREDS) TABS, Take 1 tablet by mouth in the morning and at bedtime., Disp: , Rfl:    nitroGLYCERIN  (NITROSTAT ) 0.4 MG SL tablet, Place 0.4 mg under the tongue every 5 (five) minutes as needed for chest pain., Disp: , Rfl:    nortriptyline (PAMELOR) 10 MG capsule, Take 10 mg by mouth See admin instructions. Take 10 mg by mouth 30-45 minutes prior to bedtime for overactive intestinal muscles, Disp: , Rfl:    nystatin  (MYCOSTATIN ) 100000 UNIT/ML suspension, Use as directed 5 mLs (500,000 Units total) in the mouth or throat 4 (four) times daily. (Patient not taking: Reported on 06/10/2024), Disp: 56 mL, Rfl: 0   omeprazole (PRILOSEC) 20 MG capsule, Take 20 mg by mouth daily before breakfast., Disp: , Rfl:    potassium chloride  SA (KLOR-CON  M) 20 MEQ tablet, Take 20 mEq by mouth 2 (two) times daily., Disp: , Rfl:    sacubitril -valsartan  (ENTRESTO ) 49-51 MG, Take 1 tablet by mouth 2 (two) times daily., Disp: 60 tablet, Rfl: 5   spironolactone  (ALDACTONE ) 25 MG tablet, Take 0.5 tablets (12.5 mg total) by mouth daily. (Patient taking differently: Take 25 mg by mouth daily.), Disp: 45 tablet, Rfl: 3   sulfamethoxazole -trimethoprim  (BACTRIM  DS)  800-160 MG tablet, Take 1 tablet by mouth 2 (two) times daily. (Patient not taking: Reported on 06/10/2024), Disp: 20 tablet, Rfl: 0   torsemide  (DEMADEX ) 20 MG tablet, Take 20 mg by mouth See admin instructions. Take 20 mg by mouth in the morning and may take an additional 20 mg once a day as directed for a period of 3 days, for an overnight weight gain of 3 to 5 pounds,  Disp: 72 tablet, Rfl: 0   umeclidinium bromide  (INCRUSE ELLIPTA ) 62.5 MCG/ACT AEPB, Inhale 1 puff into the lungs daily. (Patient not taking: Reported on 06/10/2024), Disp: 30 each, Rfl: 2   VENTOLIN  HFA 108 (90 Base) MCG/ACT inhaler, Inhale 1-2 puffs into the lungs every 4 (four) hours as needed for wheezing or shortness of breath. , Disp: , Rfl:  [2]  Social History Tobacco Use  Smoking Status Every Day   Current packs/day: 0.50   Average packs/day: 0.5 packs/day for 50.0 years (25.0 ttl pk-yrs)   Types: Cigarettes   Passive exposure: Never  Smokeless Tobacco Never

## 2024-08-03 NOTE — Telephone Encounter (Signed)
 Patient called and cancelled 08/04/2024 CR due to weather.

## 2024-08-04 ENCOUNTER — Encounter (HOSPITAL_COMMUNITY)

## 2024-08-06 ENCOUNTER — Encounter (HOSPITAL_COMMUNITY): Admission: RE | Admit: 2024-08-06

## 2024-08-09 ENCOUNTER — Encounter (HOSPITAL_COMMUNITY)

## 2024-08-11 ENCOUNTER — Encounter (HOSPITAL_COMMUNITY)

## 2024-08-11 NOTE — Addendum Note (Signed)
 Encounter addended by: Orval Alan HERO, RD on: 08/11/2024 4:23 PM  Actions taken: Flowsheet data copied forward, Flowsheet accepted

## 2024-08-13 ENCOUNTER — Encounter (HOSPITAL_COMMUNITY): Admission: RE | Admit: 2024-08-13 | Source: Ambulatory Visit

## 2024-08-13 DIAGNOSIS — I5042 Chronic combined systolic (congestive) and diastolic (congestive) heart failure: Secondary | ICD-10-CM

## 2024-08-16 ENCOUNTER — Encounter (HOSPITAL_COMMUNITY)

## 2024-08-18 ENCOUNTER — Encounter (HOSPITAL_COMMUNITY)

## 2024-08-20 ENCOUNTER — Encounter (HOSPITAL_COMMUNITY)

## 2024-08-23 ENCOUNTER — Encounter (HOSPITAL_COMMUNITY)

## 2024-08-25 ENCOUNTER — Encounter (HOSPITAL_COMMUNITY)

## 2024-08-27 ENCOUNTER — Encounter (HOSPITAL_COMMUNITY)

## 2024-08-30 ENCOUNTER — Encounter (HOSPITAL_COMMUNITY)

## 2024-09-01 ENCOUNTER — Encounter (HOSPITAL_COMMUNITY)
